# Patient Record
Sex: Female | Born: 1950
Health system: Southern US, Community
[De-identification: ages and names within clinical notes are randomized; demographics above are authoritative.]

## PROBLEM LIST (undated history)

## (undated) DIAGNOSIS — M25569 Pain in unspecified knee: Secondary | ICD-10-CM

## (undated) DIAGNOSIS — R112 Nausea with vomiting, unspecified: Secondary | ICD-10-CM

## (undated) DIAGNOSIS — I82890 Acute embolism and thrombosis of other specified veins: Secondary | ICD-10-CM

## (undated) DIAGNOSIS — T8859XA Other complications of anesthesia, initial encounter: Secondary | ICD-10-CM

## (undated) DIAGNOSIS — T4145XA Adverse effect of unspecified anesthetic, initial encounter: Secondary | ICD-10-CM

## (undated) DIAGNOSIS — G2581 Restless legs syndrome: Secondary | ICD-10-CM

## (undated) DIAGNOSIS — K219 Gastro-esophageal reflux disease without esophagitis: Secondary | ICD-10-CM

## (undated) DIAGNOSIS — Z923 Personal history of irradiation: Secondary | ICD-10-CM

## (undated) DIAGNOSIS — K5790 Diverticulosis of intestine, part unspecified, without perforation or abscess without bleeding: Secondary | ICD-10-CM

## (undated) DIAGNOSIS — F419 Anxiety disorder, unspecified: Secondary | ICD-10-CM

## (undated) DIAGNOSIS — Z87442 Personal history of urinary calculi: Secondary | ICD-10-CM

## (undated) DIAGNOSIS — Z9889 Other specified postprocedural states: Secondary | ICD-10-CM

## (undated) DIAGNOSIS — M719 Bursopathy, unspecified: Secondary | ICD-10-CM

## (undated) DIAGNOSIS — D649 Anemia, unspecified: Secondary | ICD-10-CM

## (undated) DIAGNOSIS — J189 Pneumonia, unspecified organism: Secondary | ICD-10-CM

## (undated) DIAGNOSIS — K591 Functional diarrhea: Secondary | ICD-10-CM

## (undated) DIAGNOSIS — I10 Essential (primary) hypertension: Secondary | ICD-10-CM

## (undated) DIAGNOSIS — M1990 Unspecified osteoarthritis, unspecified site: Secondary | ICD-10-CM

## (undated) DIAGNOSIS — E7800 Pure hypercholesterolemia, unspecified: Secondary | ICD-10-CM

## (undated) DIAGNOSIS — E079 Disorder of thyroid, unspecified: Secondary | ICD-10-CM

## (undated) DIAGNOSIS — C801 Malignant (primary) neoplasm, unspecified: Secondary | ICD-10-CM

## (undated) DIAGNOSIS — M722 Plantar fascial fibromatosis: Secondary | ICD-10-CM

## (undated) DIAGNOSIS — K2100 Gastro-esophageal reflux disease with esophagitis, without bleeding: Secondary | ICD-10-CM

## (undated) HISTORY — DX: Disorder of thyroid, unspecified: E07.9

## (undated) HISTORY — DX: Pure hypercholesterolemia, unspecified: E78.00

## (undated) HISTORY — DX: Restless legs syndrome: G25.81

## (undated) HISTORY — DX: Gastro-esophageal reflux disease with esophagitis: K21.0

## (undated) HISTORY — DX: Bursopathy, unspecified: M71.9

## (undated) HISTORY — DX: Personal history of urinary calculi: Z87.442

## (undated) HISTORY — PX: APPENDECTOMY: SHX54

## (undated) HISTORY — DX: Plantar fascial fibromatosis: M72.2

## (undated) HISTORY — DX: Unspecified osteoarthritis, unspecified site: M19.90

## (undated) HISTORY — PX: OVARIAN CYST REMOVAL: SHX89

## (undated) HISTORY — DX: Pain in unspecified knee: M25.569

## (undated) HISTORY — DX: Diverticulosis of intestine, part unspecified, without perforation or abscess without bleeding: K57.90

## (undated) HISTORY — PX: COLPORRHAPHY: SHX921

## (undated) HISTORY — DX: Gastro-esophageal reflux disease without esophagitis: K21.9

## (undated) HISTORY — PX: TONSILLECTOMY: SHX5217

## (undated) HISTORY — PX: TONSILLECTOMY: SUR1361

## (undated) HISTORY — PX: TUBAL LIGATION: SHX77

## (undated) HISTORY — DX: Essential (primary) hypertension: I10

## (undated) HISTORY — DX: Gastro-esophageal reflux disease with esophagitis, without bleeding: K21.00

## (undated) HISTORY — PX: VARICOSE VEIN SURGERY: SHX832

---
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
## 2000-11-26 ENCOUNTER — Other Ambulatory Visit: Admission: RE | Admit: 2000-11-26 | Discharge: 2000-11-26 | Payer: Self-pay | Admitting: Obstetrics and Gynecology

## 2002-03-06 ENCOUNTER — Other Ambulatory Visit: Admission: RE | Admit: 2002-03-06 | Discharge: 2002-03-06 | Payer: Self-pay | Admitting: Obstetrics and Gynecology

## 2003-03-05 ENCOUNTER — Encounter: Admission: RE | Admit: 2003-03-05 | Discharge: 2003-03-05 | Payer: Self-pay | Admitting: Gastroenterology

## 2003-03-09 ENCOUNTER — Encounter: Admission: RE | Admit: 2003-03-09 | Discharge: 2003-03-09 | Payer: Self-pay | Admitting: Family Medicine

## 2003-05-12 ENCOUNTER — Encounter (INDEPENDENT_AMBULATORY_CARE_PROVIDER_SITE_OTHER): Payer: Self-pay | Admitting: *Deleted

## 2003-05-12 ENCOUNTER — Ambulatory Visit (HOSPITAL_COMMUNITY): Admission: RE | Admit: 2003-05-12 | Discharge: 2003-05-12 | Payer: Self-pay | Admitting: Gastroenterology

## 2003-06-21 ENCOUNTER — Other Ambulatory Visit: Admission: RE | Admit: 2003-06-21 | Discharge: 2003-06-21 | Payer: Self-pay | Admitting: Obstetrics and Gynecology

## 2003-07-08 ENCOUNTER — Encounter: Admission: RE | Admit: 2003-07-08 | Discharge: 2003-07-08 | Payer: Self-pay | Admitting: General Surgery

## 2003-07-12 ENCOUNTER — Encounter (INDEPENDENT_AMBULATORY_CARE_PROVIDER_SITE_OTHER): Payer: Self-pay | Admitting: *Deleted

## 2003-07-12 ENCOUNTER — Ambulatory Visit (HOSPITAL_COMMUNITY): Admission: RE | Admit: 2003-07-12 | Discharge: 2003-07-12 | Payer: Self-pay | Admitting: General Surgery

## 2003-07-12 ENCOUNTER — Ambulatory Visit (HOSPITAL_BASED_OUTPATIENT_CLINIC_OR_DEPARTMENT_OTHER): Admission: RE | Admit: 2003-07-12 | Discharge: 2003-07-12 | Payer: Self-pay | Admitting: General Surgery

## 2003-07-23 ENCOUNTER — Encounter: Admission: RE | Admit: 2003-07-23 | Discharge: 2003-07-23 | Payer: Self-pay | Admitting: General Surgery

## 2004-02-18 ENCOUNTER — Ambulatory Visit (HOSPITAL_COMMUNITY): Admission: RE | Admit: 2004-02-18 | Discharge: 2004-02-18 | Payer: Self-pay | Admitting: Internal Medicine

## 2004-11-20 ENCOUNTER — Encounter: Admission: RE | Admit: 2004-11-20 | Discharge: 2004-11-20 | Payer: Self-pay | Admitting: Obstetrics and Gynecology

## 2005-04-26 ENCOUNTER — Encounter (INDEPENDENT_AMBULATORY_CARE_PROVIDER_SITE_OTHER): Payer: Self-pay | Admitting: Specialist

## 2005-04-26 ENCOUNTER — Ambulatory Visit (HOSPITAL_BASED_OUTPATIENT_CLINIC_OR_DEPARTMENT_OTHER): Admission: RE | Admit: 2005-04-26 | Discharge: 2005-04-26 | Payer: Self-pay | Admitting: Plastic Surgery

## 2005-05-02 ENCOUNTER — Inpatient Hospital Stay (HOSPITAL_COMMUNITY): Admission: AC | Admit: 2005-05-02 | Discharge: 2005-05-04 | Payer: Self-pay

## 2005-07-20 ENCOUNTER — Encounter: Admission: RE | Admit: 2005-07-20 | Discharge: 2005-07-20 | Payer: Self-pay | Admitting: Obstetrics and Gynecology

## 2005-07-24 ENCOUNTER — Other Ambulatory Visit: Admission: RE | Admit: 2005-07-24 | Discharge: 2005-07-24 | Payer: Self-pay | Admitting: Obstetrics and Gynecology

## 2005-10-17 ENCOUNTER — Observation Stay (HOSPITAL_COMMUNITY): Admission: AD | Admit: 2005-10-17 | Discharge: 2005-10-18 | Payer: Self-pay | Admitting: Family Medicine

## 2005-10-17 ENCOUNTER — Ambulatory Visit: Payer: Self-pay | Admitting: *Deleted

## 2005-11-02 ENCOUNTER — Encounter (HOSPITAL_COMMUNITY): Admission: RE | Admit: 2005-11-02 | Discharge: 2005-11-17 | Payer: Self-pay | Admitting: *Deleted

## 2005-12-05 ENCOUNTER — Encounter: Admission: RE | Admit: 2005-12-05 | Discharge: 2005-12-05 | Payer: Self-pay | Admitting: Obstetrics and Gynecology

## 2006-05-14 ENCOUNTER — Encounter: Admission: RE | Admit: 2006-05-14 | Discharge: 2006-05-14 | Payer: Self-pay | Admitting: Obstetrics and Gynecology

## 2006-11-06 ENCOUNTER — Ambulatory Visit: Payer: Self-pay | Admitting: Psychology

## 2006-12-06 ENCOUNTER — Ambulatory Visit: Payer: Self-pay | Admitting: Psychology

## 2006-12-09 ENCOUNTER — Encounter: Admission: RE | Admit: 2006-12-09 | Discharge: 2006-12-09 | Payer: Self-pay | Admitting: Obstetrics and Gynecology

## 2006-12-17 ENCOUNTER — Other Ambulatory Visit: Admission: RE | Admit: 2006-12-17 | Discharge: 2006-12-17 | Payer: Self-pay | Admitting: Obstetrics and Gynecology

## 2006-12-20 ENCOUNTER — Ambulatory Visit: Payer: Self-pay | Admitting: Psychology

## 2007-01-02 ENCOUNTER — Ambulatory Visit: Payer: Self-pay | Admitting: Psychology

## 2007-01-15 ENCOUNTER — Ambulatory Visit: Payer: Self-pay | Admitting: Psychology

## 2007-01-27 ENCOUNTER — Ambulatory Visit: Payer: Self-pay | Admitting: Psychology

## 2007-03-11 ENCOUNTER — Ambulatory Visit: Payer: Self-pay | Admitting: Psychology

## 2008-04-22 ENCOUNTER — Other Ambulatory Visit: Admission: RE | Admit: 2008-04-22 | Discharge: 2008-04-22 | Payer: Self-pay | Admitting: Obstetrics & Gynecology

## 2009-01-19 ENCOUNTER — Encounter: Admission: RE | Admit: 2009-01-19 | Discharge: 2009-01-19 | Payer: Self-pay | Admitting: Obstetrics & Gynecology

## 2009-01-27 ENCOUNTER — Encounter: Admission: RE | Admit: 2009-01-27 | Discharge: 2009-01-27 | Payer: Self-pay | Admitting: Obstetrics & Gynecology

## 2009-02-19 HISTORY — PX: CARPAL TUNNEL RELEASE: SHX101

## 2009-02-19 HISTORY — PX: RECTOCELE REPAIR: SHX761

## 2009-04-27 LAB — HM PAP SMEAR

## 2009-10-18 ENCOUNTER — Ambulatory Visit (HOSPITAL_COMMUNITY)
Admission: RE | Admit: 2009-10-18 | Discharge: 2009-10-19 | Payer: Self-pay | Source: Home / Self Care | Admitting: Urology

## 2009-10-18 ENCOUNTER — Encounter: Payer: Self-pay | Admitting: Obstetrics & Gynecology

## 2009-10-18 HISTORY — PX: TOTAL VAGINAL HYSTERECTOMY: SHX2548

## 2010-03-11 ENCOUNTER — Encounter: Payer: Self-pay | Admitting: Obstetrics and Gynecology

## 2010-03-12 ENCOUNTER — Encounter: Payer: Self-pay | Admitting: Obstetrics and Gynecology

## 2010-03-22 ENCOUNTER — Encounter: Payer: Self-pay | Admitting: Obstetrics & Gynecology

## 2010-05-02 ENCOUNTER — Other Ambulatory Visit: Payer: Self-pay | Admitting: Obstetrics & Gynecology

## 2010-05-02 DIAGNOSIS — Z1231 Encounter for screening mammogram for malignant neoplasm of breast: Secondary | ICD-10-CM

## 2010-05-05 ENCOUNTER — Ambulatory Visit: Payer: Self-pay

## 2010-05-05 LAB — COMPREHENSIVE METABOLIC PANEL
ALT: 24 U/L (ref 0–35)
AST: 26 U/L (ref 0–37)
Albumin: 4.3 g/dL (ref 3.5–5.2)
Alkaline Phosphatase: 69 U/L (ref 39–117)
BUN: 17 mg/dL (ref 6–23)
CO2: 28 mEq/L (ref 19–32)
Calcium: 9.4 mg/dL (ref 8.4–10.5)
Chloride: 105 mEq/L (ref 96–112)
Creatinine, Ser: 0.83 mg/dL (ref 0.4–1.2)
GFR calc Af Amer: >60 mL/min (ref >60)
GFR calc non Af Amer: >60 mL/min (ref >60)
Glucose, Bld: 100 mg/dL — ABNORMAL HIGH (ref 70–99)
Potassium: 4.4 mEq/L (ref 3.5–5.1)
Sodium: 138 mEq/L (ref 135–145)
Total Bilirubin: 0.8 mg/dL (ref 0.3–1.2)
Total Protein: 7.6 g/dL (ref 6.0–8.3)

## 2010-05-05 LAB — APTT: aPTT: 29 seconds (ref 24–37)

## 2010-05-05 LAB — CBC
HCT: 31 % — ABNORMAL LOW (ref 36.0–46.0)
HCT: 40.3 % (ref 36.0–46.0)
Hemoglobin: 11 g/dL — ABNORMAL LOW (ref 12.0–15.0)
Hemoglobin: 14.1 g/dL (ref 12.0–15.0)
MCH: 32.7 pg (ref 26.0–34.0)
MCH: 33.3 pg (ref 26.0–34.0)
MCHC: 35.1 g/dL (ref 30.0–36.0)
MCHC: 35.6 g/dL (ref 30.0–36.0)
MCV: 93.4 fL (ref 78.0–100.0)
MCV: 93.7 fL (ref 78.0–100.0)
Platelets: 238 10*3/uL (ref 150–400)
Platelets: 254 10*3/uL (ref 150–400)
RBC: 3.3 MIL/uL — ABNORMAL LOW (ref 3.87–5.11)
RBC: 4.32 MIL/uL (ref 3.87–5.11)
RDW: 11.9 % (ref 11.5–15.5)
RDW: 12.2 % (ref 11.5–15.5)
WBC: 5.9 10*3/uL (ref 4.0–10.5)
WBC: 8.6 10*3/uL (ref 4.0–10.5)

## 2010-05-05 LAB — TYPE AND SCREEN
ABO/RH(D): A POS
Antibody Screen: NEGATIVE

## 2010-05-05 LAB — PROTIME-INR
INR: 1 (ref 0.00–1.49)
Prothrombin Time: 13.4 seconds (ref 11.6–15.2)

## 2010-05-05 LAB — URINALYSIS, ROUTINE W REFLEX MICROSCOPIC
Bilirubin Urine: NEGATIVE
Glucose, UA: NEGATIVE mg/dL
Hgb urine dipstick: NEGATIVE
Ketones, ur: NEGATIVE mg/dL
Nitrite: NEGATIVE
Protein, ur: NEGATIVE mg/dL
Specific Gravity, Urine: 1.01 (ref 1.005–1.030)
Urobilinogen, UA: 0.2 mg/dL (ref 0.0–1.0)
pH: 7 (ref 5.0–8.0)

## 2010-05-05 LAB — BASIC METABOLIC PANEL
BUN: 6 mg/dL (ref 6–23)
CO2: 26 mEq/L (ref 19–32)
Calcium: 8.8 mg/dL (ref 8.4–10.5)
Chloride: 107 mEq/L (ref 96–112)
Creatinine, Ser: 0.79 mg/dL (ref 0.4–1.2)
GFR calc Af Amer: >60 mL/min (ref >60)
GFR calc non Af Amer: >60 mL/min (ref >60)
Glucose, Bld: 149 mg/dL — ABNORMAL HIGH (ref 70–99)
Potassium: 4.2 mEq/L (ref 3.5–5.1)
Sodium: 140 mEq/L (ref 135–145)

## 2010-05-05 LAB — HEMOGLOBIN AND HEMATOCRIT, BLOOD
HCT: 32.6 % — ABNORMAL LOW (ref 36.0–46.0)
HCT: 34.1 % — ABNORMAL LOW (ref 36.0–46.0)
Hemoglobin: 11.6 g/dL — ABNORMAL LOW (ref 12.0–15.0)
Hemoglobin: 12.1 g/dL (ref 12.0–15.0)

## 2010-05-05 LAB — SURGICAL PCR SCREEN
MRSA, PCR: NEGATIVE
Staphylococcus aureus: POSITIVE — AB

## 2010-05-05 LAB — ABO/RH: ABO/RH(D): A POS

## 2010-05-15 ENCOUNTER — Other Ambulatory Visit (HOSPITAL_COMMUNITY): Payer: Self-pay | Admitting: Family Medicine

## 2010-05-15 ENCOUNTER — Ambulatory Visit (HOSPITAL_COMMUNITY)
Admission: RE | Admit: 2010-05-15 | Discharge: 2010-05-15 | Disposition: A | Discharge status: Home / Self Care | Payer: BC Managed Care – PPO | Source: Ambulatory Visit | Attending: Family Medicine | Admitting: Family Medicine

## 2010-05-15 DIAGNOSIS — R0609 Other forms of dyspnea: Secondary | ICD-10-CM | POA: Insufficient documentation

## 2010-05-15 DIAGNOSIS — R05 Cough: Secondary | ICD-10-CM | POA: Insufficient documentation

## 2010-05-15 DIAGNOSIS — R0600 Dyspnea, unspecified: Secondary | ICD-10-CM

## 2010-05-15 DIAGNOSIS — R059 Cough, unspecified: Secondary | ICD-10-CM | POA: Insufficient documentation

## 2010-05-15 DIAGNOSIS — R0989 Other specified symptoms and signs involving the circulatory and respiratory systems: Secondary | ICD-10-CM | POA: Insufficient documentation

## 2010-05-15 LAB — BLOOD GAS, ARTERIAL
Acid-base deficit: 0.9 mmol/L (ref 0.0–2.0)
Bicarbonate: 23.1 mEq/L (ref 20.0–24.0)
Drawn by: 23534
FIO2: 0.21 %
O2 Content: 21 L/min
O2 Saturation: 97.5 %
Patient temperature: 37
TCO2: 20.3 mmol/L (ref 0–100)
pCO2 arterial: 37.7 mmHg (ref 35.0–45.0)
pH, Arterial: 7.405 — ABNORMAL HIGH (ref 7.350–7.400)
pO2, Arterial: 90.9 mmHg (ref 80.0–100.0)

## 2010-07-05 ENCOUNTER — Ambulatory Visit
Admission: RE | Admit: 2010-07-05 | Discharge: 2010-07-05 | Disposition: A | Discharge status: Home / Self Care | Payer: BC Managed Care – PPO | Source: Ambulatory Visit | Attending: Obstetrics & Gynecology | Admitting: Obstetrics & Gynecology

## 2010-07-05 DIAGNOSIS — Z1231 Encounter for screening mammogram for malignant neoplasm of breast: Secondary | ICD-10-CM

## 2010-07-07 NOTE — Op Note (Signed)
NAME:  Linda Woods, Linda Woods                           ACCOUNT NO.:  [id number]   MEDICAL RECORD NO.:  [id number]                   PATIENT TYPE:  AMB   LOCATION:  ENDO                                 FACILITY:  [facility]   PHYSICIAN:  Anselmo Rod, M.D.               DATE OF BIRTH:  07-20-1950   DATE OF PROCEDURE:  05/12/2003  DATE OF DISCHARGE:                                 OPERATIVE REPORT   PROCEDURE:  Esophagogastroduodenoscopy.   ENDOSCOPIST:  Anselmo Rod, M.D.   INSTRUMENT:  Olympus video panendoscope.   INDICATIONS FOR PROCEDURE:  A 60 year old white female with a history of  abdominal pain and rectal bleeding undergoing an EGD to rule out peptic  ulcer disease.  The patient has a history of reflux with severe heartburn  for several years.   PREPROCEDURE PREPARATION:  Informed consent was obtained from the patient.  The patient fasted for eight hours prior to the procedure.   PREPROCEDURE PHYSICAL:  The patient had stable vital signs.  Neck supple.  Chest clear to auscultation.  S1, S2 regular.  Abdomen soft with normal  bowel sounds.   DESCRIPTION OF PROCEDURE:  The patient was placed in the left lateral  decubitus position and sedated with 6 mg of Demerol and 6 mg of Versed  intravenously.  Once the patient was adequately sedated and maintained on  low-flow oxygen and continuous cardiac monitoring, the Olympus video  panendoscope was advanced through the mouthpiece over the tongue into the  esophagus under direct vision.  The entire esophagus appeared normal with no  evidence of ring, strictures, masses, esophagitis, or Barrett's mucosa.  The  scope was then advanced into the stomach.  Diffuse gastritis was noted  throughout the gastric mucosa with small hiatal hernia seen on high  retroflexion.  The proximal small bowel appeared normal.  No ulcers,  erosions, masses, or polyps were seen.   IMPRESSION:  1. Diffuse gastritis with small hiatal hernia.  No ulcers or  masses seen.  2. Normal appearing esophagus and proximal small bowel.   RECOMMENDATIONS:  1. Continue PPIs.  2. Proceed with a colonoscopy at this time.  Further recommendations will be     made thereafter.                                               Anselmo Rod, M.D.    JNM/MEDQ  D:  05/12/2003  T:  05/13/2003  Job:  [id number]   cc:   Aram Beecham P. Romine, M.D.  [address]  [place]  [place] [address]  Fax: [phone]   Mila Homer. Sudie Bailey, M.D.  [address] [place], [place] [address]  Fax: [phone]

## 2010-07-07 NOTE — H&P (Signed)
NAME:  Linda Woods, Linda Woods                 ACCOUNT NO.:  [id number]   MEDICAL RECORD NO.:  [id number]          PATIENT TYPE:  INP   LOCATION:  A216                          FACILITY:  [facility]   PHYSICIAN:  Mila Homer. Sudie Bailey, M.D.DATE OF BIRTH:  [birth year]   DATE OF ADMISSION:  10/17/2005  DATE OF DISCHARGE:  LH                                HISTORY & PHYSICAL   This 60 year old woman developed left chest discomfort radiating to her left  shoulder and down her left elbow shortly after lunch today.  She never had  symptoms like this before.  The pain was rated an 8 out of 10 scale.  After  she took calcium carbonate with Simethicone, the pain gradually seemed to  get better but then waxed and waned.  She did not break out in a sweat.  She  had no severe weakness.  She had no nausea and vomiting.  She had no dyspnea  with this.   CURRENT MEDICATIONS:  Synthroid 100 mcg daily and she had an Allegra D 24  hours this morning for allergies.  She has a history of hypertriglyceridemia  but could not tolerate Tricor which caused muscle pains.  She was on no  other regular meds.   FAMILY HISTORY:  Father died in his 82s of CHF and COPD and probably had  CAD, he was a heavy smoker.  Mother had uterine cancer and radiation to the  chest secondary to this and ended up with coronary artery disease that was  thought to be secondary to radiation but died in her 46s, as well.   Other than hypertriglyceridemia, the patient has had no coronary risks.  She  has never been smoker.  She has had no problem with hypertension or  diabetes.   PHYSICAL EXAMINATION:  GENERAL:  Today, she ambulated in the office.  She is well developed, well  nourished, and distressed due to left upper chest pain and pressure.  VITAL SIGNS:  Blood pressure 110/80 and pulse 120.  HEART:  Regular rhythm at a rate of 120, no murmurs.  LUNGS:  Clear throughout, she was moving air well.  ABDOMEN:  Soft without hepatosplenomegaly or  mass.  She had no tenderness to palpation of the left clavicle, AC joint, or  shoulder.  In fact, she moves her shoulder well.  Full extension of the  shoulder without pain.  EXTREMITIES:  Grossly normal.   A 12 lead EKG was run in the office and other than sinus tachycardia at a  rate of 120, the EKG was normal.   ADMISSION ASSESSMENT:  1. Chest pain.  2. Hypothyroidism on replacement thyroid hormone.  3. Allergic rhinitis on antihistamine and decongestants.   I have admitted her to the hospital on a monitor.  Cardiac enzymes are  pending as is T4, TSH, CBC, lipids.  Chest x-ray is, likewise, pending and a  repeat 12 lead EKG.   Further, I have discussed with Dr. Dionicio Stall, cardiologist.  He will see  her this afternoon.  He is also adding a D-dimer to her list of labs.  She  may require a stress test.      Mila Homer. Sudie Bailey, M.D.  Electronically Signed     SDK/MEDQ  D:  10/17/2005  T:  10/17/2005  Job:  [id number]

## 2010-07-07 NOTE — Discharge Summary (Signed)
NAME:  Linda Woods, Linda Woods                 ACCOUNT NO.:  [id number]   MEDICAL RECORD NO.:  [id number]          PATIENT TYPE:  INP   LOCATION:  A216                          FACILITY:  [facility]   PHYSICIAN:  Mila Homer. Sudie Bailey, M.D.DATE OF BIRTH:  Jul 18, 1950   DATE OF ADMISSION:  10/17/2005  DATE OF DISCHARGE:  08/30/2007LH                                 DISCHARGE SUMMARY   SUMMARY:  This 60 year old woman was admitted yesterday after developing  severe left anterior  chest discomfort with a pain level of eight on a scale  of 10.  This also was felt in the left shoulder and down the left elbow.  She had been on a two-day hospitalization.  Vital signs remained stable.  Cardiac enzymes times three were normal.  Her CBC and CMET were normal.  She  had a T4 of 5.3, TSH 1.06, D-dimer 0.29, total cholesterol of 275,  triglycerides 372, HDL 53, and LDL 147.  However, her EKG was essentially  normal.  The second day she felt much better.  She had a tiny bit of  discomfort in the left anterior  chest the night before, but otherwise felt  absolutely fine.  She had been seen by Dr. Dionicio Stall, cardiologist.  She  was to be set up for a stress test, outpatient.   DISCHARGE DIAGNOSES:  1. Chest pain.  2. Reflux esophagitis.  3. Hypercholesterolemia.  4. Hypothyroidism.  5. Side effects from the Statin drug atorvastatin.   DISCHARGE MEDICATIONS:  The patient is discharged home on:  1. Lovastatin 10 mg daily (30 with 11 refills).  2. The patient is to continue her Synthroid 100 mcg daily.  3. The patient is to take an aspirin 81 mg daily.   The patient had a problem with achiness on Lipitor in the past.  If she has  any problems with achiness on lovastatin we will put her on Tricor 145 mg  daily.   DISCHARGE INSTRUCTIONS:  In the meantime we will continue her no her  exercise and diet program, which she has already been in an attempt to lower  her weight further.      Mila Homer. Sudie Bailey, M.D.  Electronically Signed     SDK/MEDQ  D:  10/18/2005  T:  10/18/2005  Job:  [id number]

## 2010-07-07 NOTE — Op Note (Signed)
NAME:  Linda Woods, Linda Woods                           ACCOUNT NO.:  [id number]   MEDICAL RECORD NO.:  [id number]                   PATIENT TYPE:  AMB   LOCATION:  ENDO                                 FACILITY:  [facility]   PHYSICIAN:  Anselmo Rod, M.D.               DATE OF BIRTH:  1950/06/04   DATE OF PROCEDURE:  05/12/2003  DATE OF DISCHARGE:                                 OPERATIVE REPORT   PROCEDURE PERFORMED:  Colonoscopy with biopsies.   ENDOSCOPIST:  Anselmo Rod, M.D.   INSTRUMENT USED:  Olympus video colonoscope.   INDICATION FOR PROCEDURE:  A 60 year old white female with a history of  rectal bleeding and abdominal pain with mucoid stools.  Rule out colonic  polyps, masses, IBD, etc.   PREPROCEDURE PREPARATION:  Informed consent was obtained.  The patient was  fasted for eight hours prior to the procedure and prepped with a bottle of  magnesium citrate and a gallon of GoLYTELY the night prior to the procedure.   PREPROCEDURE PHYSICAL:  VITAL SIGNS:  The patient had stable vital signs.  NECK:  Supple.  CHEST:  Clear to auscultation.  S1, S2 regular.  ABDOMEN:  Soft with normal bowel sounds.   DESCRIPTION OF PROCEDURE:  The patient was placed in the left lateral  decubitus position and sedated with Demerol and Versed for the EGD.  Once  the patient was adequately sedate and maintained on low-flow oxygen and  continuous cardiac monitoring, the Olympus video colonoscope was advanced  from the rectum to the cecum and terminal ileum.  The entire exam revealed  no abnormalities except for a nodular lesion in the rectum that was biopsied  for pathology.  Small internal hemorrhoids were seen on retroflexion as  well.  The rectal lesion was biopsied for pathology.  There was no evidence  of diverticulosis.  The patient tolerated the procedure well without  complication.   IMPRESSION:  1. Small nodular lesion of the anal verge, biopsied for pathology.  2. Small internal  hemorrhoids.  3. No evidence of diverticulosis.  4. Normal-appearing sigmoid colon, transverse colon, right colon, cecum, and     terminal ileum.   RECOMMENDATIONS:  1. Await pathology results.  2. Avoid all nonsteroidals for now.  3. Outpatient follow-up in the next two weeks for further recommendations.     The patient will require surgical evaluation for removal of the lesion at     the anal verge.  This has been discussed with her in great detail.  The     patient seems to understand.  Her husband is aware of this as well.                                               Jyothi Nat  Loreta Ave, M.D.    JNM/MEDQ  D:  05/12/2003  T:  05/13/2003  Job:  [id number]   cc:   Aram Beecham P. Romine, M.D.  [address]  [place]  [place] [address]  Fax: [phone]   Mila Homer. Sudie Bailey, M.D.  [address] [place], [place] [address]  Fax: [phone]

## 2010-07-07 NOTE — Consult Note (Signed)
NAME:  Linda Woods, Linda Woods                 ACCOUNT NO.:  [id number]   MEDICAL RECORD NO.:  [id number]          PATIENT TYPE:  INP   LOCATION:  A216                          FACILITY:  [facility]   PHYSICIAN:  Farris Has. Dorethea Clan, MD  DATE OF BIRTH:  06/27/1950   DATE OF CONSULTATION:  10/17/2005  DATE OF DISCHARGE:                                   CONSULTATION   PRIMARY CARE PHYSICIAN:  Mila Homer. Sudie Bailey, M.D.   HISTORY OF PRESENT ILLNESS:  Linda Woods is a 60 year old woman who is in  otherwise excellent health until this afternoon when she had an episode of  discomfort in her chest while eating in a restaurant.  It lasted about an  hour and was in the left anterior chest, radiated down the left arm,  associated with diaphoresis, no shortness of breath, no palpitations.  She  did say that she thought her heart was beating a little bit harder than it  would normally.  Denies any exertional component to the discomfort, no  relieving or exacerbating symptoms.  It waxed and waned over the course of  about an hour and then has pretty much resolved spontaneously.  She was seen  in Dr. Michelle Nasuti office and admitted directly to [facility].  She  is currently without pain.   PAST MEDICAL HISTORY:  Significant for hypothyroidism.  She is on  replacement for that.  She also has a history of hyperlipidemia.  She was  previously on a statin but stopped it due to myalgias.  She has had three  spontaneous vaginal deliveries.  Her youngest child is 51 years old.  She  has had several surgeries under general endotracheal anesthetic without  complications.  She had no complications with her pregnancy.   REVIEW OF SYSTEMS:  Negative.   ALLERGIES:  NO KNOWN DRUG ALLERGIES.   SOCIAL HISTORY:  She lives in [place].  She does not smoke, drink or use  illicit drugs.   FAMILY HISTORY:  Both parents have had cardiac disease but late in life.  She has no siblings with cardiac disease.   PHYSICAL  EXAMINATION:  GENERAL:  She is a well-developed, well-nourished  white female no apparent distress.  She is alert and oriented x4.  Very  pleasant, excellent historian.   CURRENT MEDICATIONS:  Include Synthroid 100 mcg daily, Allegra D one pill  daily, Nexium 1 pill daily I think it is 40 mg.   VITAL SIGNS:  Heart rate 89, respiratory rate 20, 128/92 blood pressure.  She is afebrile.  HEENT:  Unremarkable.  NECK:  Supple.  No jugular venous distention, carotid bruits.  CHEST:  Clear to auscultation.  CARDIOVASCULAR:  Regular without murmur.  First and second heart sounds are  normal.  ABDOMEN:  Soft and nontender with normal active bowel sounds, no  hepatosplenomegaly.  EXTREMITIES:  Lower extremities are without cyanosis, clubbing or edema.  Pulses are 1+.  She has no bruits noted.  NEUROLOGIC/MUSCULOSKELETAL:  Grossly nonfocal.   Electrocardiogram shows sinus rhythm at a rate of 89, normal intervals,  normal axes.  She has  nonspecific ST-T wave changes in the anterior and  inferior leads.  No old EKGs for comparison.  Her chest x-ray and  laboratories are all pending.  So this is a lady with a single episode of  chest pain, sounds pretty atypical, it has resolved.  We don't have an  entirely intact data set but she is pain free now.  Her EKG is mildly  abnormal but we have no EKGs for comparison.  She does have cardiac risk  factor of hyperlipidemia but no other significant cardiac risk factors.   1. Hyperlipidemia.  She has failed statins in the past.  She may benefit      from nonstatin therapy for her hyperlipidemia if indeed it continues to      be elevated.  2. Hypothyroidism.  She is on replacement.  TSH is pending.   RECOMMENDATIONS:  1. Check a D-dimer.  If this is normal, I would not pursue the diagnosis      of pulmonary embolus any further.  2. Check chest x-ray.  3. Check cycle the enzymes.  4. I am going to add aspirin 81 mg once a day.  5. If her cardiac  enzymes are negative, she has no recurrent chest      discomfort, her chest x-ray and her D-dimer are normal, I think that a      perfusion study would be reasonable.  That can be done either as an      inpatient or an outpatient depending on her preference as she would be      deemed relatively low risk.      Farris Has. Dorethea Clan, MD  Electronically Signed     JMH/MEDQ  D:  10/17/2005  T:  10/18/2005  Job:  [id number]

## 2010-07-07 NOTE — Op Note (Signed)
NAME:  Linda, Woods                 ACCOUNT NO.:  [id number]   MEDICAL RECORD NO.:  [id number]          PATIENT TYPE:  AMB   LOCATION:  DSC                          FACILITY:  [facility]   PHYSICIAN:  Alfredia Ferguson, M.D.  DATE OF BIRTH:  03/19/1950   DATE OF PROCEDURE:  04/26/2005  DATE OF DISCHARGE:                                 OPERATIVE REPORT   PREOPERATIVE DIAGNOSIS:  A 3-4 cm left upper back subcutaneous mass,  probable lipoma.   POSTOPERATIVE DIAGNOSIS:  A 3-4 cm left upper back subcutaneous mass,  probable lipoma.   OPERATION PERFORMED:  Excision of 4.5 cm lipoma, left upper back paraspinal  area.   SURGEON:  Dr. Benna Dunks.   ANESTHESIA:  MAC supplemented with 1% Xylocaine 1:100,000 epinephrine.   INDICATIONS FOR SURGERY:  This is a 60 year old woman with a slowly  enlarging subcutaneous mass in the left upper back just to the left to the  paraspinous area. The patient wishes to have this area removed. She  understands the risk of scarring, bleeding, infection and recurrence of the  lesion. In spite of that the patient wishes to proceed.   DESCRIPTION OF SURGERY:  Skin marks were placed with a transversely oriented  mark and the patient was then taken to the OR. She was given intravenous  analgesia and the patient was then placed in prone position. 1% Xylocaine  1:100,000 epinephrine was infiltrated in the area of the incision. The area  was prepped with Betadine and draped with sterile drapes. Approximately 3.5  cm incision was made and deepened until reaching the mass. The mass was  encased in a fibrous material. This fibrous material was dissected away from  the surrounding normal tissue. Dissection was carried out using a  combination of sharp and electrocautery dissection. The depth of the lesion  went down to the paraspinous muscles. The hemostasis was maintained  throughout the dissection using electrocautery. There was one large vessel  going into this mass which  was clamped and tied with a 3-0 Vicryl. The  lesion was completely removed. The specimen submitted for pathology. The  wound was irrigated with saline irrigation. 10 mL of  0.5% Marcaine was also  infiltrated at the end of the surgery for postop pain relief. After  achieving meticulous hemostasis the wound was closed by approximating the  dermis with interrupted 3-0 Monocryl suture. Several interrupted 4-0 nylons  were also placed in the skin edges. Steri-Strips followed by a light  dressing was applied. The patient was transported to recovery room in  satisfactory condition.      Alfredia Ferguson, M.D.  Electronically Signed     WBB/MEDQ  D:  04/26/2005  T:  04/26/2005  Job:  [id number]

## 2010-07-07 NOTE — Op Note (Signed)
NAME:  Linda Woods, Linda Woods                           ACCOUNT NO.:  [id number]   MEDICAL RECORD NO.:  [id number]                   PATIENT TYPE:  AMB   LOCATION:  DSC                                  FACILITY:  [facility]   PHYSICIAN:  Adolph Pollack, M.D.            DATE OF BIRTH:  December 21, 1950   DATE OF PROCEDURE:  07/12/2003  DATE OF DISCHARGE:                                 OPERATIVE REPORT   PREOPERATIVE DIAGNOSIS:  Rectal mass.   POSTOPERATIVE DIAGNOSIS:  Rectal mass.   PROCEDURE:  Transanal excision of rectal mass.   SURGEON:  Adolph Pollack, M.D.   ANESTHESIA:  General.   INDICATIONS:  This is a 60 year old female who had rectal bleeding,  underwent a colonoscopy.  She had a known rectal mass present anteriorly and  this was biopsied and was benign, but the mass was not able to be completely  removed.  I discussed with her transanal excision and she agreed, and now  she presents for that.   TECHNIQUE:  She is seen in the holding area, then brought to the operating  room and placed supine on the operating table, and a general anesthetic was  administered.  She was then placed in the lithotomy position.  The perianal  area was sterilely prepped and draped.  Digital rectal exam was performed  and the mass was identified anteriorly.  Anoscopy was performed and the mass  was visualized.  I grasped the mass with Allis forceps and anesthetized the  subcutaneous tissue with Marcaine.  Using electrocautery I excised the mass  and sent it to pathology.  Bleeding was controlled with some cautery.  I  then reapproximated the anal mucosa with interrupted chromic sutures.  Hemostasis was adequate at this time.  A piece of Gelfoam was placed also on  the wound.  A bulky dressing was applied.  She was then taken out of the  lithotomy position and placed supine.   She tolerated the procedure without any apparent complications, was taken to  the recovery room in satisfactory condition.   Postop instructions were given  to her as well as Vicodin for pain, and the plan is for follow-up in the  office in approximately one to two weeks.                                               Adolph Pollack, M.D.    Kari Baars  D:  07/12/2003  T:  07/13/2003  Job:  [id number]   cc:   Anselmo Rod, M.D.  [address]  [place]  [place] [address]  Fax: [phone]   Mila Homer. Sudie Bailey, M.D.  [address] [place], [place] [address]  Fax: [phone]

## 2011-08-13 ENCOUNTER — Other Ambulatory Visit: Payer: Self-pay | Admitting: Obstetrics & Gynecology

## 2011-08-13 DIAGNOSIS — Z1231 Encounter for screening mammogram for malignant neoplasm of breast: Secondary | ICD-10-CM

## 2011-10-01 ENCOUNTER — Ambulatory Visit: Payer: BC Managed Care – PPO

## 2012-01-14 ENCOUNTER — Ambulatory Visit
Admission: RE | Admit: 2012-01-14 | Discharge: 2012-01-14 | Disposition: A | Discharge status: Home / Self Care | Payer: BC Managed Care – PPO | Source: Ambulatory Visit | Attending: Obstetrics & Gynecology | Admitting: Obstetrics & Gynecology

## 2012-01-14 DIAGNOSIS — Z1231 Encounter for screening mammogram for malignant neoplasm of breast: Secondary | ICD-10-CM

## 2013-03-23 ENCOUNTER — Other Ambulatory Visit: Payer: Self-pay

## 2013-03-23 DIAGNOSIS — Z1231 Encounter for screening mammogram for malignant neoplasm of breast: Secondary | ICD-10-CM

## 2013-03-31 ENCOUNTER — Ambulatory Visit: Payer: Self-pay | Admitting: Nurse Practitioner

## 2013-04-08 ENCOUNTER — Ambulatory Visit: Payer: BC Managed Care – PPO

## 2013-04-23 ENCOUNTER — Ambulatory Visit
Admission: RE | Admit: 2013-04-23 | Discharge: 2013-04-23 | Disposition: A | Discharge status: Home / Self Care | Payer: BC Managed Care – PPO | Source: Ambulatory Visit

## 2013-04-23 DIAGNOSIS — Z1231 Encounter for screening mammogram for malignant neoplasm of breast: Secondary | ICD-10-CM

## 2013-05-20 ENCOUNTER — Encounter: Payer: Self-pay | Admitting: Nurse Practitioner

## 2013-05-21 ENCOUNTER — Ambulatory Visit (INDEPENDENT_AMBULATORY_CARE_PROVIDER_SITE_OTHER): Payer: BC Managed Care – PPO | Admitting: Nurse Practitioner

## 2013-05-21 ENCOUNTER — Encounter: Payer: Self-pay | Admitting: Nurse Practitioner

## 2013-05-21 VITALS — BP 130/86 | HR 84 | Ht 65.5 in | Wt 169.0 lb

## 2013-05-21 DIAGNOSIS — E559 Vitamin D deficiency, unspecified: Secondary | ICD-10-CM | POA: Insufficient documentation

## 2013-05-21 DIAGNOSIS — Z0000 Encounter for general adult medical examination without abnormal findings: Secondary | ICD-10-CM

## 2013-05-21 DIAGNOSIS — Z01419 Encounter for gynecological examination (general) (routine) without abnormal findings: Secondary | ICD-10-CM

## 2013-05-21 DIAGNOSIS — I10 Essential (primary) hypertension: Secondary | ICD-10-CM | POA: Insufficient documentation

## 2013-05-21 DIAGNOSIS — E039 Hypothyroidism, unspecified: Secondary | ICD-10-CM | POA: Insufficient documentation

## 2013-05-21 LAB — POCT URINALYSIS DIPSTICK
Bilirubin, UA: NEGATIVE
Blood, UA: NEGATIVE
Glucose, UA: NEGATIVE
Ketones, UA: NEGATIVE
Leukocytes, UA: NEGATIVE
Nitrite, UA: NEGATIVE
Protein, UA: NEGATIVE
Urobilinogen, UA: NEGATIVE
pH, UA: 5

## 2013-05-21 LAB — HEMOGLOBIN, FINGERSTICK: Hemoglobin, fingerstick: 13.5 g/dL (ref 12.0–16.0)

## 2013-05-21 NOTE — Patient Instructions (Signed)

## 2013-05-21 NOTE — Progress Notes (Signed)
Patient ID: Linda Woods, female   DOB: [birth year], 63 y.o.   MRN: [id number] 63 y.o. [id number] Married Caucasian Fe here for annual exam.  Doing well has been with grandchildren this week.  PCP is now watching blood sugar and thyroid.  She has appointment there next week.  Patient's last menstrual period was 08/21/2002.          Sexually active: no  The current method of family planning is tubal ligation and post menopausal status.    Exercising: no  The patient does not participate in regular exercise at present.  Exercises off and on. Smoker:  no  Health Maintenance: Pap:  04/27/09, WNL MMG:  04/23/13, Bi-Rads 1: negative Colonoscopy:  03/2007, diverticula, repeat in 5 years BMD:  01/19/09, -0.2/-0.1 TDaP:  04/27/09 Labs:  HB:  13.5 Urine:  Negative   reports that she has never smoked. She has never used smokeless tobacco. She reports that she does not drink alcohol or use illicit drugs.  Past Medical History  Diagnosis Date  . Thyroid disease     hypothyroid  . Chronic reflux esophagitis   . Pure hypercholesterolemia   . History of kidney stones   . Plantar fasciitis   . Hypertension     Past Surgical History  Procedure Laterality Date  . Appendectomy    . Colporrhaphy      posterior  . Tonsillectomy    . Tubal ligation Bilateral   . Ovarian cyst removal    . Varicose vein surgery    . Total vaginal hysterectomy  2011    rectocele repair, sling  . Rectocele repair  2011    w/TVH and sling    Current Outpatient Prescriptions  Medication Sig Dispense Refill  . levothyroxine (SYNTHROID, LEVOTHROID) 100 MCG tablet Take 100 mcg by mouth daily before breakfast.      . losartan (COZAAR) 25 MG tablet Take 25 mg by mouth daily.      . Multiple Vitamin (MULTIVITAMIN) tablet Take 1 tablet by mouth daily.      . RABEprazole (ACIPHEX) 20 MG tablet Take 20 mg by mouth daily.       No current facility-administered medications for this visit.    Family History  Problem Relation Age  of Onset  . Cervical cancer Mother   . Heart failure Mother   . Heart failure Father   . Diabetes Father   . Breast cancer Maternal Aunt   . Breast cancer Paternal Aunt     ROS:  Pertinent items are noted in HPI.  Otherwise, a comprehensive ROS was negative.  Exam:   BP 130/86  Pulse 84  Ht 5' 5.5" (1.664 m)  Wt 169 lb (76.658 kg)  BMI 27.69 kg/m2  LMP 08/21/2002 Height: 5' 5.5" (166.4 cm)  Ht Readings from Last 3 Encounters:  05/21/13 5' 5.5" (1.664 m)    General appearance: alert, cooperative and appears stated age Head: Normocephalic, without obvious abnormality, atraumatic Neck: no adenopathy, supple, symmetrical, trachea midline and thyroid normal to inspection and palpation Lungs: clear to auscultation bilaterally Breasts: normal appearance, no masses or tenderness Heart: regular rate and rhythm Abdomen: soft, non-tender; no masses,  no organomegaly Extremities: extremities normal, atraumatic, no cyanosis or edema Skin: Skin color, texture, turgor normal. No rashes or lesions Lymph nodes: Cervical, supraclavicular, and axillary nodes normal. No abnormal inguinal nodes palpated Neurologic: Grossly normal   Pelvic: External genitalia:  no lesions  Urethra:  normal appearing urethra with no masses, tenderness or lesions              Bartholin's and Skene's: normal                 Vagina: normal appearing vagina with normal color and discharge, no lesions              Cervix: absent              Pap taken: no Bimanual Exam:  Uterus:  uterus absent              Adnexa: no mass, fullness, tenderness               Rectovaginal: Confirms               Anus:  normal sphincter tone, no lesions  A:  Well Woman with normal exam  S/P TVH and sling for prolapse and rectocele repair 09/2009  Hypothyroid, HTN  Vit D deficient  Borderline DM  Elevated BP today  P:   Pap smear as per guidelines   Mammogram due 3/16  Will follow with PCP next week  Will schedule  colonoscopy with PCP  Counseled on breast self exam, mammography screening, adequate intake of calcium and vitamin D, diet and exercise, Kegel's exercises return annually or prn  An After Visit Summary was printed and given to the patient.

## 2013-05-22 LAB — VITAMIN D 25 HYDROXY (VIT D DEFICIENCY, FRACTURES): Vit D, 25-Hydroxy: 31 ng/mL (ref 30–89)

## 2013-05-26 ENCOUNTER — Telehealth: Payer: Self-pay | Admitting: *Deleted

## 2013-05-26 NOTE — Telephone Encounter (Signed)
Patient returning Linda Woods's call. The patient states it is fine to leave a detailed message on her phone number designated above.

## 2013-05-26 NOTE — Telephone Encounter (Signed)
I have attempted to contact this patient by phone with the following results: left message to return my call on answering machine (home).  

## 2013-05-26 NOTE — Telephone Encounter (Signed)
I have attempted to contact this patient by phone with the following results: left message to return my call with husband at home number.

## 2013-05-26 NOTE — Telephone Encounter (Signed)
Returning a call to [place].

## 2013-05-26 NOTE — Telephone Encounter (Signed)
Message copied by Graylon Good on Tue May 26, 2013  9:16 AM ------      Message from: Kem Boroughs R      Created: Sun May 24, 2013  5:24 PM       Vit D is a little low, follow protocol ------

## 2013-05-26 NOTE — Progress Notes (Signed)
Encounter reviewed by Dr. Dandre Sisler Silva.  

## 2013-05-27 NOTE — Telephone Encounter (Signed)
Patient is returning a call to [place]. She states it is ok to leave a detailed message on her cell phone [phone]

## 2013-05-27 NOTE — Telephone Encounter (Signed)
I have attempted to contact this patient by phone with the following results: left message to return my call on answering machine (mobile per DPR).  Detailed message left on cell voicemail per previous note.  Pt will need to return call to discuss Vit D supplement.  Advised pt to ask for Olivia Mackie or Verline Lema if I am unavailable.

## 2013-05-27 NOTE — Telephone Encounter (Signed)
Returning a call to [place]. Patient says to call home number first.

## 2013-05-28 NOTE — Telephone Encounter (Signed)
Pt notified in result note.

## 2013-12-21 ENCOUNTER — Encounter: Payer: Self-pay | Admitting: Nurse Practitioner

## 2014-02-19 HISTORY — PX: HAND SURGERY: SHX662

## 2014-02-24 ENCOUNTER — Telehealth: Payer: Self-pay | Admitting: Obstetrics & Gynecology

## 2014-02-24 DIAGNOSIS — Z1211 Encounter for screening for malignant neoplasm of colon: Secondary | ICD-10-CM

## 2014-02-24 NOTE — Telephone Encounter (Signed)
Patient is asking for referral for colonoscopy with Dr.Medoff. Patient is asking to see an endocrinologist for a thyroid lump.

## 2014-02-25 NOTE — Telephone Encounter (Signed)
Called patient, phone at home rang and rang with no answer or voicemail.  Referral entered for Dr. Earlean Shawl for GI for screening colonoscopy.   Will speak with patient regarding thyroid concern.

## 2014-02-25 NOTE — Telephone Encounter (Signed)
Spoke with patient. Advised referral sent to Dr. Allyn Kenner and they will contact her to schedule. Prior patient of Dr. Collene Woods, patient states she will request her records.   Patient states that her PCP follows her thyroid. Patient feels she needs specialist. Patient also states she thinks she can feel something on her thyroid. Has not had imaging ordered by pcp.  Last visit with Linda Woods, [place] 05/2013, nothing on palpation of thyroid.  Advised patient referral should come from PCP, who is managing her thyroid. Patient disagrees. She does not want to obtain referral from pcp due to long wait in office and feels that it has been mismanaged. Advised patient that our office does not have records to send with referral, patient states she will obtain.   Advised would send request to Linda Cage, FNP as patient does not agree with my response.

## 2014-02-26 ENCOUNTER — Ambulatory Visit (HOSPITAL_COMMUNITY)
Admission: RE | Admit: 2014-02-26 | Discharge: 2014-02-26 | Disposition: A | Discharge status: Home / Self Care | Payer: BLUE CROSS/BLUE SHIELD | Source: Ambulatory Visit | Attending: Family Medicine | Admitting: Family Medicine

## 2014-02-26 ENCOUNTER — Other Ambulatory Visit (HOSPITAL_COMMUNITY): Payer: Self-pay | Admitting: Family Medicine

## 2014-02-26 ENCOUNTER — Telehealth: Payer: Self-pay | Admitting: Nurse Practitioner

## 2014-02-26 DIAGNOSIS — R221 Localized swelling, mass and lump, neck: Secondary | ICD-10-CM | POA: Diagnosis not present

## 2014-02-26 DIAGNOSIS — E0590 Thyrotoxicosis, unspecified without thyrotoxic crisis or storm: Secondary | ICD-10-CM | POA: Diagnosis present

## 2014-02-26 DIAGNOSIS — I10 Essential (primary) hypertension: Secondary | ICD-10-CM | POA: Insufficient documentation

## 2014-02-26 NOTE — Telephone Encounter (Signed)
Pt says she is returning Patty's call. Wanted to let her know she saw her pcp doctor this morning and was sent for a ultrasound.

## 2014-02-26 NOTE — Telephone Encounter (Signed)
Routing to [employer], FNP and Dr.Miller as Juluis Rainier. Please see previous telephone encounter from 02/24/14.  Routing to provider for final review. Patient agreeable to disposition. Will close encounter

## 2014-02-26 NOTE — Telephone Encounter (Signed)
Discussed with Dr. Sabra Heck.  We can see her today at 11:00 and proceed from there to evaluate her thyroid.  I called the home number - adult son states she is at the MD office now.  Looking at Epic schedule she is getting an [name] of neck/thyroid this am at 9:30 am.  So maybe she has been able to discuss with PCP and get follow up.  Routing back to Dr. Lestine Box.  I did leave MSG on her cell number to call us with an update.

## 2014-05-04 ENCOUNTER — Other Ambulatory Visit: Payer: Self-pay | Admitting: Gastroenterology

## 2014-05-04 DIAGNOSIS — K862 Cyst of pancreas: Secondary | ICD-10-CM

## 2014-05-13 ENCOUNTER — Ambulatory Visit
Admission: RE | Admit: 2014-05-13 | Discharge: 2014-05-13 | Disposition: A | Discharge status: Home / Self Care | Payer: BLUE CROSS/BLUE SHIELD | Source: Ambulatory Visit | Attending: Gastroenterology | Admitting: Gastroenterology

## 2014-05-13 DIAGNOSIS — K862 Cyst of pancreas: Secondary | ICD-10-CM

## 2014-05-28 ENCOUNTER — Ambulatory Visit: Payer: Self-pay | Admitting: Obstetrics & Gynecology

## 2014-10-11 ENCOUNTER — Ambulatory Visit: Payer: Self-pay | Admitting: Obstetrics & Gynecology

## 2014-10-12 ENCOUNTER — Encounter: Payer: Self-pay | Admitting: Obstetrics & Gynecology

## 2014-10-12 ENCOUNTER — Ambulatory Visit (INDEPENDENT_AMBULATORY_CARE_PROVIDER_SITE_OTHER): Payer: BLUE CROSS/BLUE SHIELD | Admitting: Obstetrics & Gynecology

## 2014-10-12 VITALS — BP 128/82 | HR 60 | Resp 16 | Ht 65.5 in | Wt 175.0 lb

## 2014-10-12 DIAGNOSIS — E2839 Other primary ovarian failure: Secondary | ICD-10-CM

## 2014-10-12 DIAGNOSIS — K219 Gastro-esophageal reflux disease without esophagitis: Secondary | ICD-10-CM | POA: Insufficient documentation

## 2014-10-12 DIAGNOSIS — Z01419 Encounter for gynecological examination (general) (routine) without abnormal findings: Secondary | ICD-10-CM

## 2014-10-12 DIAGNOSIS — Z0000 Encounter for general adult medical examination without abnormal findings: Secondary | ICD-10-CM

## 2014-10-12 DIAGNOSIS — E781 Pure hyperglyceridemia: Secondary | ICD-10-CM | POA: Insufficient documentation

## 2014-10-12 LAB — POCT URINALYSIS DIPSTICK
Bilirubin, UA: NEGATIVE
Glucose, UA: NEGATIVE
Ketones, UA: NEGATIVE
Leukocytes, UA: NEGATIVE
Nitrite, UA: NEGATIVE
Protein, UA: NEGATIVE
Urobilinogen, UA: NEGATIVE
pH, UA: 5

## 2014-10-12 NOTE — Progress Notes (Signed)
64 y.o. [id number] MarriedCaucasianF here for annual exam.  Has hand surgery this afternoon.  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  Having a lot of pain/numbness in the finger.    No vaginal bleeding.    Had   PCP:  Dr. Karie Kirks.  Last visit was about a year ago.  Has seen cardiologist (Dr. Arlee Muslim with [facility]) and Dr. Steffanie Dunn, endocrinologist, in [place] as well.    Patient's last menstrual period was 08/21/2002.          Sexually active: No.  The current method of family planning is status post hysterectomy.    Exercising: No.  not regularly Smoker:  no  Health Maintenance: Pap:  04/27/09 WNL History of abnormal Pap:  no MMG:  04/23/13 3D-BiRads 1-normal.  Aware due.  Pt states will schedule.   Colonoscopy:  2/16-repeat in 5 years and endoscopy BMD:   01/19/09 TDaP:  07/25/14 Screening Labs: Endocrinologist, Hb today: Endocrinologist, Urine today: neg   reports that she has never smoked. She has never used smokeless tobacco. She reports that she drinks about 0.6 - 1.2 oz of alcohol per week. She reports that she does not use illicit drugs.  Past Medical History  Diagnosis Date  . Thyroid disease     hypothyroid  . Chronic reflux esophagitis   . Pure hypercholesterolemia   . History of kidney stones   . Plantar fasciitis   . Hypertension   . Knee pain     right knee-seeing ortho    Past Surgical History  Procedure Laterality Date  . Appendectomy    . Colporrhaphy      posterior  . Tonsillectomy    . Tubal ligation Bilateral   . Ovarian cyst removal    . Varicose vein surgery    . Total vaginal hysterectomy  10/18/2009    rectocele repair, sling  . Rectocele repair  2011    w/TVH and sling    Current Outpatient Prescriptions  Medication Sig Dispense Refill  . Ascorbic Acid (VITAMIN C) 100 MG tablet Take 100 mg by mouth.    . Calcium Carb-Cholecalciferol (CALCIUM + D3) 600-200 MG-UNIT TABS Take 1 tablet by mouth.    .  famotidine (PEPCID) 40 MG tablet Take 40 mg by mouth.    . fenofibrate (TRICOR) 145 MG tablet Take 145 mg by mouth.    . levothyroxine (SYNTHROID) 112 MCG tablet Take 112 mcg by mouth.    Marland Kitchen LORazepam (ATIVAN) 0.5 MG tablet Take 0.5 mg by mouth.    . metoprolol succinate (TOPROL XL) 50 MG 24 hr tablet Take 50 mg by mouth.    . Multiple Vitamin (MULTIVITAMIN) tablet Take 1 tablet by mouth daily.    . vitamin B-12 (CYANOCOBALAMIN) 1000 MCG tablet Take 1,000 mcg by mouth.    . Vitamin D, Cholecalciferol, 1000 UNITS TABS Take 1,000 Units by mouth.    . TRAMADOL HCL PO Take by mouth.     No current facility-administered medications for this visit.    Family History  Problem Relation Age of Onset  . Cervical cancer Mother   . Heart failure Mother   . Heart failure Father   . Diabetes Father   . Breast cancer Maternal Aunt   . Breast cancer Paternal Aunt   . Cancer Brother     ROS:  Pertinent items are noted in HPI.  Otherwise, a comprehensive ROS was negative.  Exam:   BP 128/82 mmHg  Pulse 60  Resp 16  Ht 5' 5.5" (1.664 m)  Wt 175 lb (79.379 kg)  BMI 28.67 kg/m2  LMP 08/21/2002  Weight change: #6  Height: 5' 5.5" (166.4 cm)  Ht Readings from Last 3 Encounters:  10/12/14 5' 5.5" (1.664 m)  05/21/13 5' 5.5" (1.664 m)    General appearance: alert, cooperative and appears stated age Head: Normocephalic, without obvious abnormality, atraumatic Neck: no adenopathy, supple, symmetrical, trachea midline and thyroid normal to inspection and palpation Lungs: clear to auscultation bilaterally Breasts: normal appearance, no masses or tenderness Heart: regular rate and rhythm Abdomen: soft, non-tender; bowel sounds normal; no masses,  no organomegaly Extremities: extremities normal, atraumatic, no cyanosis or edema Skin: Skin color, texture, turgor normal. No rashes or lesions Lymph nodes: Cervical, supraclavicular, and axillary nodes normal. No abnormal inguinal nodes  palpated Neurologic: Grossly normal   Pelvic: External genitalia:  no lesions              Urethra:  normal appearing urethra with no masses, tenderness or lesions              Bartholins and Skenes: normal                 Vagina: normal appearing vagina with normal color and discharge, no lesions              Cervix: absent              Pap taken: No. Bimanual Exam:  Uterus:  uterus absent              Adnexa: no mass, fullness, tenderness               Rectovaginal: Confirms               Anus:  normal sphincter tone, no lesions  Chaperone was present for exam.  A:  Well Woman with normal exam S/P TVH and sling for prolapse and rectocele repair 09/2009 Hypothyroid Hypertension Elevated triglycerides  P: Mammogram due.  Pt aware and states she will schedule. BMD with MMG.  Order placed.  Pt aware. No pap today.  H/O TVH.  Normal physical exam today Release of records signed for copy of colonoscopy  Pt will see PCP and do labs in the fall.   Follow up 1 year or PRN new problems

## 2015-04-19 ENCOUNTER — Other Ambulatory Visit: Payer: Self-pay | Admitting: Gastroenterology

## 2015-04-19 DIAGNOSIS — N281 Cyst of kidney, acquired: Secondary | ICD-10-CM

## 2015-05-02 ENCOUNTER — Other Ambulatory Visit: Payer: Self-pay

## 2015-05-02 DIAGNOSIS — Z1231 Encounter for screening mammogram for malignant neoplasm of breast: Secondary | ICD-10-CM

## 2015-05-17 ENCOUNTER — Ambulatory Visit
Admission: RE | Admit: 2015-05-17 | Discharge: 2015-05-17 | Disposition: A | Discharge status: Home / Self Care | Payer: BLUE CROSS/BLUE SHIELD | Source: Ambulatory Visit | Attending: Gastroenterology | Admitting: Gastroenterology

## 2015-05-17 ENCOUNTER — Ambulatory Visit: Payer: BLUE CROSS/BLUE SHIELD

## 2015-05-17 DIAGNOSIS — N281 Cyst of kidney, acquired: Secondary | ICD-10-CM

## 2015-06-14 ENCOUNTER — Ambulatory Visit
Admission: RE | Admit: 2015-06-14 | Discharge: 2015-06-14 | Disposition: A | Discharge status: Home / Self Care | Payer: BLUE CROSS/BLUE SHIELD | Source: Ambulatory Visit

## 2015-06-14 DIAGNOSIS — Z1231 Encounter for screening mammogram for malignant neoplasm of breast: Secondary | ICD-10-CM

## 2015-11-17 DIAGNOSIS — I10 Essential (primary) hypertension: Secondary | ICD-10-CM | POA: Insufficient documentation

## 2015-11-17 DIAGNOSIS — R635 Abnormal weight gain: Secondary | ICD-10-CM | POA: Insufficient documentation

## 2015-11-17 DIAGNOSIS — E669 Obesity, unspecified: Secondary | ICD-10-CM | POA: Insufficient documentation

## 2016-01-24 ENCOUNTER — Ambulatory Visit: Payer: BLUE CROSS/BLUE SHIELD | Admitting: Obstetrics & Gynecology

## 2016-03-06 ENCOUNTER — Encounter: Payer: Self-pay | Admitting: Obstetrics & Gynecology

## 2016-03-06 ENCOUNTER — Ambulatory Visit (INDEPENDENT_AMBULATORY_CARE_PROVIDER_SITE_OTHER): Payer: 59 | Admitting: Obstetrics & Gynecology

## 2016-03-06 VITALS — BP 118/78 | HR 70 | Resp 12 | Ht 65.25 in | Wt 174.8 lb

## 2016-03-06 DIAGNOSIS — Z01419 Encounter for gynecological examination (general) (routine) without abnormal findings: Secondary | ICD-10-CM

## 2016-03-06 DIAGNOSIS — Z124 Encounter for screening for malignant neoplasm of cervix: Secondary | ICD-10-CM

## 2016-03-06 MED ORDER — RABEPRAZOLE SODIUM 20 MG PO TBEC: 20.0000 mg | DELAYED_RELEASE_TABLET | ORAL | Status: AC

## 2016-03-06 NOTE — Progress Notes (Signed)
66 y.o. [id number] Married Caucasian F here for annual exam.  Doing well.  No vaginal bleeding.  Actively working on weight loss.  Total cholesterol 267.  HbA1C was 5.9.  Having right knee issues.  Seeing Dr. Alvan Dame who has recommended she start considering this.    PCP:  Dr.  Karie Kirks  Patient's last menstrual period was 08/21/2002.          Sexually active: Yes.    The current method of family planning is status post hysterectomy.    Exercising: Yes.    walking Smoker:  no  Health Maintenance: Pap:  2011- normal  History of abnormal Pap:  no MMG:  06/14/15 BIRADS 1 negative  Colonoscopy:  06/07/14 polyps- repeat 5 years  BMD:   01/19/09 normal   TDaP:  04/27/09 Pneumonia vaccine(s): PCP Zostavax:   PCP (pt thinks she's done it) Hep C testing: Pt has this planned with next blood work Screening Labs: PCP, Hb today: PCP, Urine today: PCP   reports that she has never smoked. She has never used smokeless tobacco. She reports that she drinks alcohol. She reports that she does not use drugs.  Past Medical History:  Diagnosis Date  . Chronic reflux esophagitis   . History of kidney stones   . Hypertension   . Knee pain    right knee-seeing ortho  . Plantar fasciitis   . Pure hypercholesterolemia   . Thyroid disease    hypothyroid    Past Surgical History:  Procedure Laterality Date  . APPENDECTOMY    . COLPORRHAPHY     posterior  . OVARIAN CYST REMOVAL    . RECTOCELE REPAIR  2011   w/TVH and sling  . TONSILLECTOMY    . TOTAL VAGINAL HYSTERECTOMY  10/18/2009   rectocele repair, sling  . TUBAL LIGATION Bilateral   . VARICOSE VEIN SURGERY      Current Outpatient Prescriptions  Medication Sig Dispense Refill  . Ascorbic Acid (VITAMIN C) 100 MG tablet Take 100 mg by mouth.    . Calcium Carb-Cholecalciferol (CALCIUM + D3) 600-200 MG-UNIT TABS Take 1 tablet by mouth.    . famotidine (PEPCID) 40 MG tablet Take 40 mg by mouth.    Marland Kitchen LORazepam (ATIVAN) 0.5 MG tablet Take 0.5 mg by  mouth.    . Multiple Vitamin (MULTIVITAMIN) tablet Take 1 tablet by mouth daily.    . vitamin B-12 (CYANOCOBALAMIN) 1000 MCG tablet Take 1,000 mcg by mouth.    . Vitamin D, Cholecalciferol, 1000 UNITS TABS Take 1,000 Units by mouth.    . fenofibrate (TRICOR) 145 MG tablet Take 145 mg by mouth.    . levothyroxine (SYNTHROID) 112 MCG tablet Take 112 mcg by mouth.    . metoprolol succinate (TOPROL XL) 50 MG 24 hr tablet Take 50 mg by mouth.     No current facility-administered medications for this visit.     Family History  Problem Relation Age of Onset  . Cervical cancer Mother   . Heart failure Mother   . Heart failure Father   . Diabetes Father   . Breast cancer Maternal Aunt   . Breast cancer Paternal Aunt   . Cancer Brother     ROS:  Pertinent items are noted in HPI.  Otherwise, a comprehensive ROS was negative.  Exam:   BP 118/78 (BP Location: Left Arm, Patient Position: Sitting, Cuff Size: Normal)   Pulse 70   Resp 12   Ht 5' 5.25" (1.657 m)   Wt 174  lb 12.8 oz (79.3 kg)   LMP 08/21/2002   BMI 28.87 kg/m   Weight change: -1#   Height: 5' 5.25" (165.7 cm)  Ht Readings from Last 3 Encounters:  03/06/16 5' 5.25" (1.657 m)  10/12/14 5' 5.5" (1.664 m)  05/21/13 5' 5.5" (1.664 m)    General appearance: alert, cooperative and appears stated age Head: Normocephalic, without obvious abnormality, atraumatic Neck: no adenopathy, supple, symmetrical, trachea midline and thyroid normal to inspection and palpation Lungs: clear to auscultation bilaterally Breasts: normal appearance, no masses or tenderness Heart: regular rate and rhythm Abdomen: soft, non-tender; bowel sounds normal; no masses,  no organomegaly Extremities: extremities normal, atraumatic, no cyanosis or edema Skin: Skin color, texture, turgor normal. No rashes or lesions Lymph nodes: Cervical, supraclavicular, and axillary nodes normal. No abnormal inguinal nodes palpated Neurologic: Grossly normal   Pelvic:  External genitalia:  no lesions              Urethra:  normal appearing urethra with no masses, tenderness or lesions              Bartholins and Skenes: normal                 Vagina: normal appearing vagina with normal color and discharge, no lesions              Cervix: absent              Pap taken: Yes.   Bimanual Exam:  Uterus:  uterus absent              Adnexa: no mass, fullness, tenderness               Rectovaginal: Confirms               Anus:  normal sphincter tone, no lesions  Chaperone was present for exam.  A:       Well Woman with normal exam S/P TVH and sling for prolapse and rectocele repair 09/2009 Hypothyroid Hypertension Elevated triglycerides  P: Mammogram due. Pap done today.  Pt and I discussed guidelines and agreed to proceed with pap smear today. Pt is going to have Hep C testing with lab work that is scheduled She will also check about the shingles vaccine to make sure it has been done Additional lab work and vaccines done with PCP  Follow up 1 year or PRN new problems

## 2016-03-07 LAB — IPS PAP TEST WITH REFLEX TO HPV

## 2016-03-23 ENCOUNTER — Other Ambulatory Visit: Payer: Self-pay | Admitting: Nurse Practitioner

## 2016-03-23 NOTE — Telephone Encounter (Signed)
Medication refill request: Vitamin D 50,000 IU Last AEX:  03/06/16 SM Next AEX: 06/06/17 SM Last MMG (if hormonal medication request): 06/14/15 BIRADS1, Density B Refill authorized: Historical entry for Vit D 1,000 IU

## 2016-06-19 ENCOUNTER — Other Ambulatory Visit: Payer: Self-pay | Admitting: Obstetrics & Gynecology

## 2016-06-19 DIAGNOSIS — Z1231 Encounter for screening mammogram for malignant neoplasm of breast: Secondary | ICD-10-CM

## 2016-06-27 ENCOUNTER — Ambulatory Visit
Admission: RE | Admit: 2016-06-27 | Discharge: 2016-06-27 | Disposition: A | Discharge status: Home / Self Care | Payer: 59 | Source: Ambulatory Visit | Attending: Obstetrics & Gynecology | Admitting: Obstetrics & Gynecology

## 2016-06-27 DIAGNOSIS — Z1231 Encounter for screening mammogram for malignant neoplasm of breast: Secondary | ICD-10-CM

## 2017-05-17 DIAGNOSIS — M25551 Pain in right hip: Secondary | ICD-10-CM | POA: Insufficient documentation

## 2017-06-06 ENCOUNTER — Ambulatory Visit (INDEPENDENT_AMBULATORY_CARE_PROVIDER_SITE_OTHER): Payer: 59 | Admitting: Obstetrics & Gynecology

## 2017-06-06 ENCOUNTER — Encounter: Payer: Self-pay | Admitting: Obstetrics & Gynecology

## 2017-06-06 ENCOUNTER — Other Ambulatory Visit: Payer: Self-pay

## 2017-06-06 VITALS — BP 124/76 | HR 76 | Resp 14 | Ht 65.5 in | Wt 181.0 lb

## 2017-06-06 DIAGNOSIS — Z01419 Encounter for gynecological examination (general) (routine) without abnormal findings: Secondary | ICD-10-CM | POA: Diagnosis not present

## 2017-06-06 DIAGNOSIS — E2839 Other primary ovarian failure: Secondary | ICD-10-CM | POA: Diagnosis not present

## 2017-06-06 NOTE — Progress Notes (Signed)
67 y.o. [id number] MarriedCaucasianF here for annual exam.  Having some knee issues.  Has osteoarthritis.  Taking some anti-inflammatories and having some injections.  Not ready for a knee replacement.    Denies vaginal bleeding.    PCP:  Dr. Karie Kirks  Blood work was done 3 months ago.  Patient's last menstrual period was 08/21/2002.          Sexually active: No.  The current method of family planning is status post hysterectomy.    Exercising: No.  The patient does not participate in regular exercise at present. Smoker:  no  Health Maintenance: Pap:  03/06/16 Neg   04/22/08 neg  History of abnormal Pap:  no MMG:  06/27/16 BIRADS1:Neg  Colonoscopy:  06/07/14, adenomatous polyp.  Will have follow-up 5 years. BMD:   01/19/09 Normal  TDaP:  2011 Pneumonia vaccine(s):  done Shingrix:   Completed series Hep C testing: done Screening Labs: PCP   reports that she has never smoked. She has never used smokeless tobacco. She reports that she drinks alcohol. She reports that she does not use drugs.  Past Medical History:  Diagnosis Date  . Bursitis   . Chronic reflux esophagitis   . History of kidney stones   . Hypertension   . Knee pain    right knee-seeing ortho  . Osteoarthritis   . Plantar fasciitis   . Pure hypercholesterolemia   . Thyroid disease    hypothyroid    Past Surgical History:  Procedure Laterality Date  . APPENDECTOMY    . CARPAL TUNNEL RELEASE Right 2011   Dr. Amedeo Plenty  . COLPORRHAPHY     posterior  . HAND SURGERY Right 2016   Nerve surgery, Dr. Amedeo Plenty  . OVARIAN CYST REMOVAL    . RECTOCELE REPAIR  2011   w/TVH and sling  . TONSILLECTOMY    . TOTAL VAGINAL HYSTERECTOMY  10/18/2009   rectocele repair, sling  . TUBAL LIGATION Bilateral   . VARICOSE VEIN SURGERY      Current Outpatient Medications  Medication Sig Dispense Refill  . famotidine (PEPCID) 40 MG tablet Take 40 mg by mouth.    . fenofibrate (TRICOR) 145 MG tablet Take 145 mg by mouth.    .  levothyroxine (SYNTHROID) 112 MCG tablet Take 112 mcg by mouth.    Marland Kitchen LORazepam (ATIVAN) 0.5 MG tablet Take 0.5 mg by mouth.    . meloxicam (MOBIC) 15 MG tablet daily as needed.    . RABEprazole (ACIPHEX) 20 MG tablet Take 1 tablet (20 mg total) by mouth 2 (two) times a week.    . Turmeric 500 MG CAPS daily.    . metoprolol succinate (TOPROL XL) 50 MG 24 hr tablet Take 50 mg by mouth.     No current facility-administered medications for this visit.     Family History  Problem Relation Age of Onset  . Cervical cancer Mother   . Heart failure Mother   . Heart failure Father   . Diabetes Father   . Cancer Brother   . Breast cancer Maternal Aunt   . Breast cancer Paternal Aunt     Review of Systems  All other systems reviewed and are negative.   Exam:   BP 124/76 (BP Location: Right Arm, Patient Position: Sitting, Cuff Size: Large)   Pulse 76   Resp 14   Ht 5' 5.5" (1.664 m)   Wt 181 lb (82.1 kg)   LMP 08/21/2002   BMI 29.66 kg/m  Height: 5' 5.5" (166.4 cm)  Ht Readings from Last 3 Encounters:  06/06/17 5' 5.5" (1.664 m)  03/06/16 5' 5.25" (1.657 m)  10/12/14 5' 5.5" (1.664 m)    General appearance: alert, cooperative and appears stated age Head: Normocephalic, without obvious abnormality, atraumatic Neck: no adenopathy, supple, symmetrical, trachea midline and thyroid normal to inspection and palpation Lungs: clear to auscultation bilaterally Breasts: normal appearance, no masses or tenderness Heart: regular rate and rhythm Abdomen: soft, non-tender; bowel sounds normal; no masses,  no organomegaly Extremities: extremities normal, atraumatic, no cyanosis or edema Skin: Skin color, texture, turgor normal. No rashes or lesions Lymph nodes: Cervical, supraclavicular, and axillary nodes normal. No abnormal inguinal nodes palpated Neurologic: Grossly normal   Pelvic: External genitalia:  no lesions              Urethra:  normal appearing urethra with no masses,  tenderness or lesions              Bartholins and Skenes: normal                 Vagina: normal appearing vagina with normal color and discharge, no lesions              Cervix: no lesions              Pap taken: No. Bimanual Exam:  Uterus:  normal size, contour, position, consistency, mobility, non-tender              Adnexa: normal adnexa and no mass, fullness, tenderness               Rectovaginal: Confirms               Anus:  normal sphincter tone, no lesions  Chaperone was present for exam.  A:  Well Woman with normal exam PMP, no HRT S/p TVH and mid-urethral sling for prolapse and rectocele repair 8/11 Hypothyroidism Hypertension Elevated triglycerides  P:   Mammogram guidelines reviewed Order for BMD placed pap smear 2018 was negative.  Not indicated today. Lab work and vaccines are UTD.  Aware to finish pneumonia vaccination series. return annually or prn

## 2017-07-03 DIAGNOSIS — M79672 Pain in left foot: Secondary | ICD-10-CM | POA: Insufficient documentation

## 2017-07-16 ENCOUNTER — Other Ambulatory Visit: Payer: Self-pay | Admitting: Nurse Practitioner

## 2017-07-16 DIAGNOSIS — R200 Anesthesia of skin: Secondary | ICD-10-CM

## 2017-07-17 ENCOUNTER — Ambulatory Visit
Admission: RE | Admit: 2017-07-17 | Discharge: 2017-07-17 | Disposition: A | Discharge status: Home / Self Care | Payer: 59 | Source: Ambulatory Visit | Attending: Nurse Practitioner | Admitting: Nurse Practitioner

## 2017-07-17 ENCOUNTER — Other Ambulatory Visit: Payer: 59

## 2017-07-17 DIAGNOSIS — R200 Anesthesia of skin: Secondary | ICD-10-CM

## 2017-07-18 ENCOUNTER — Other Ambulatory Visit: Payer: Self-pay | Admitting: Nurse Practitioner

## 2017-07-18 DIAGNOSIS — R200 Anesthesia of skin: Secondary | ICD-10-CM

## 2017-07-18 DIAGNOSIS — R208 Other disturbances of skin sensation: Secondary | ICD-10-CM

## 2017-07-22 ENCOUNTER — Other Ambulatory Visit: Payer: Self-pay | Admitting: Nurse Practitioner

## 2017-07-22 DIAGNOSIS — M5416 Radiculopathy, lumbar region: Secondary | ICD-10-CM

## 2017-07-23 ENCOUNTER — Encounter: Payer: Self-pay | Admitting: Diagnostic Neuroimaging

## 2017-07-24 ENCOUNTER — Encounter: Payer: Self-pay | Admitting: Diagnostic Neuroimaging

## 2017-07-24 ENCOUNTER — Ambulatory Visit (INDEPENDENT_AMBULATORY_CARE_PROVIDER_SITE_OTHER): Payer: 59 | Admitting: Diagnostic Neuroimaging

## 2017-07-24 ENCOUNTER — Telehealth: Payer: Self-pay | Admitting: Diagnostic Neuroimaging

## 2017-07-24 VITALS — BP 137/83 | HR 84 | Ht 65.5 in | Wt 191.2 lb

## 2017-07-24 DIAGNOSIS — R208 Other disturbances of skin sensation: Secondary | ICD-10-CM | POA: Diagnosis not present

## 2017-07-24 DIAGNOSIS — R200 Anesthesia of skin: Secondary | ICD-10-CM

## 2017-07-24 NOTE — Progress Notes (Signed)
[facility]  PATIENT: Linda Woods DOB: 1950-03-13  REFERRING CLINICIAN: Vickey Sages, MD HISTORY FROM: patient  REASON FOR VISIT: new consult    HISTORICAL  CHIEF COMPLAINT:  Chief Complaint  Patient presents with  . Numbness    rm 7, New Pt, "numbness of left foot x 3-4 weeks"    HISTORY OF PRESENT ILLNESS:   67 year old right-handed female with hypertension, hyperglycemia, here for evaluation of left foot numbness.  3 to 4 weeks ago patient was sitting at home when all of a sudden she felt numbness in her left heel.  Symptoms have persisted since that time.  She also noted some progressive numbness affecting the left lateral part of her foot and digits 4 and 5.  She has noticed some weakness and incoordination with the left foot.  She feels that her left foot is "dragging".  Sometimes she has shooting pains that move from her left foot up her left calf.  Patient has chronic low back pain with symptoms of pain shooting into her right leg at times.  She had MRI of the lumbar spine recently which shows multilevel degenerative changes, mainly at L3-4 with mild spinal stenosis and left lateral recess stenosis.  Patient is scheduled for lumbar epidural steroid injection next week.  No problems with her left arm or left face.  No similar symptoms like this in the past.  Patient has been a little bit more active in the last 1 month physically.   REVIEW OF SYSTEMS: Full 14 system review of systems performed and negative with exception of: Weight gain swelling in legs numbness restless legs joint pain decreased energy.  ALLERGIES: Allergies  Allergen Reactions  . Statins     Joint pain    HOME MEDICATIONS: Outpatient Medications Prior to Visit  Medication Sig Dispense Refill  . famotidine (PEPCID) 40 MG tablet Take 40 mg by mouth.    . fenofibrate (TRICOR) 145 MG tablet Take 145 mg by mouth.    . levothyroxine (SYNTHROID) 112 MCG tablet Take 112 mcg by  mouth.    Marland Kitchen LORazepam (ATIVAN) 0.5 MG tablet Take 0.5 mg by mouth.    . meloxicam (MOBIC) 15 MG tablet daily as needed.    . metoprolol succinate (TOPROL XL) 50 MG 24 hr tablet Take 50 mg by mouth.    . RABEprazole (ACIPHEX) 20 MG tablet Take 1 tablet (20 mg total) by mouth 2 (two) times a week.    . Turmeric 500 MG CAPS daily.     No facility-administered medications prior to visit.     PAST MEDICAL HISTORY: Past Medical History:  Diagnosis Date  . Bursitis   . Chronic reflux esophagitis   . Diverticulosis   . GERD (gastroesophageal reflux disease)   . History of kidney stones   . Hypertension   . Knee pain    right knee-seeing ortho  . Osteoarthritis   . Plantar fasciitis   . Pure hypercholesterolemia   . RLS (restless legs syndrome)   . Thyroid disease    hypothyroid    PAST SURGICAL HISTORY: Past Surgical History:  Procedure Laterality Date  . APPENDECTOMY    . CARPAL TUNNEL RELEASE Right 2011   Dr. Amedeo Plenty  . COLPORRHAPHY     posterior  . HAND SURGERY Right 2016   Nerve surgery, Dr. Amedeo Plenty  . OVARIAN CYST REMOVAL    . RECTOCELE REPAIR  2011   w/TVH and sling  . TONSILLECTOMY    . TOTAL VAGINAL  HYSTERECTOMY  10/18/2009   rectocele repair, sling  . TUBAL LIGATION Bilateral   . VARICOSE VEIN SURGERY      FAMILY HISTORY: Family History  Problem Relation Age of Onset  . Cervical cancer Mother   . Heart failure Mother   . Heart failure Father   . Diabetes Father   . Cancer Brother   . Breast cancer Maternal Aunt   . Breast cancer Paternal Aunt     SOCIAL HISTORY:  Social History   Socioeconomic History  . Marital status: Married    Spouse name: Not on file  . Number of children: 3  . Years of education: Not on file  . Highest education level: Not on file  Occupational History    Employer: SELF EMPLOYED  Social Needs  . Financial resource strain: Not on file  . Food insecurity:    Worry: Not on file    Inability: Not on file  . Transportation  needs:    Medical: Not on file    Non-medical: Not on file  Tobacco Use  . Smoking status: Never Smoker  . Smokeless tobacco: Never Used  Substance and Sexual Activity  . Alcohol use: Yes    Alcohol/week: 0.0 oz    Comment: wine  . Drug use: No  . Sexual activity: Not Currently    Partners: Male    Birth control/protection: Surgical    Comment: Hysterectomy, BTL  Lifestyle  . Physical activity:    Days per week: Not on file    Minutes per session: Not on file  . Stress: Not on file  Relationships  . Social connections:    Talks on phone: Not on file    Gets together: Not on file    Attends religious service: Not on file    Active member of club or organization: Not on file    Attends meetings of clubs or organizations: Not on file    Relationship status: Not on file  . Intimate partner violence:    Fear of current or ex partner: Not on file    Emotionally abused: Not on file    Physically abused: Not on file    Forced sexual activity: Not on file  Other Topics Concern  . Not on file  Social History Narrative   Married, retired Copywriter, advertising   Caffeine- coffee, 1 cup, occass tea   Education- BS   Children- 3     PHYSICAL EXAM  GENERAL EXAM/CONSTITUTIONAL: Vitals:  Vitals:   07/24/17 1110  BP: 137/83  Pulse: 84  Weight: 191 lb 3.2 oz (86.7 kg)  Height: 5' 5.5" (1.664 m)     Body mass index is 31.33 kg/m.  No exam data present  Patient is in no distress; well developed, nourished and groomed; neck is supple  STRAIGHT LEG RAISE NEG  CARDIOVASCULAR:  Examination of carotid arteries is normal; no carotid bruits  Regular rate and rhythm, no murmurs  Examination of peripheral vascular system by observation and palpation is normal  EYES:  Ophthalmoscopic exam of optic discs and posterior segments is normal; no papilledema or hemorrhages  MUSCULOSKELETAL:  Gait, strength, tone, movements noted in Neurologic exam below  NEUROLOGIC: MENTAL  STATUS:  No flowsheet data found.  awake, alert, oriented to person, place and time  recent and remote memory intact  normal attention and concentration  language fluent, comprehension intact, naming intact,   fund of knowledge appropriate  CRANIAL NERVE:   2nd - no papilledema on fundoscopic exam  2nd, 3rd, 4th, 6th - pupils equal and reactive to light, visual fields full to confrontation, extraocular muscles intact, no nystagmus  5th - facial sensation symmetric  7th - facial strength symmetric  8th - hearing intact  9th - palate elevates symmetrically, uvula midline  11th - shoulder shrug symmetric  12th - tongue protrusion midline  MOTOR:   normal bulk and tone, full strength in the BUE, BLE  SENSORY:   normal and symmetric to light touch, pinprick, temperature, vibration; EXCEPT DECR IN LEFT HEEL AND LEFT LATERAL FOOT TO PP AND TEMP  COORDINATION:   finger-nose-finger, fine finger movements normal  REFLEXES:   deep tendon reflexes TRACE and symmetric  GAIT/STATION:   narrow based gait; SLIGHT DIFF WITH LEFT FOOT HEEL AND TOE WALKING; romberg is negative    DIAGNOSTIC DATA (LABS, IMAGING, TESTING) - I reviewed patient records, labs, notes, testing and imaging myself where available.  Lab Results  Component Value Date   WBC 8.6 10/19/2009   HGB 13.5 05/21/2013   HCT 31.0 (L) 10/19/2009   MCV 93.7 10/19/2009   PLT 238 10/19/2009      Component Value Date/Time   NA 140 10/19/2009 0400   K 4.2 10/19/2009 0400   CL 107 10/19/2009 0400   CO2 26 10/19/2009 0400   GLUCOSE 149 (H) 10/19/2009 0400   BUN 6 10/19/2009 0400   CREATININE 0.79 10/19/2009 0400   CALCIUM 8.8 10/19/2009 0400   PROT 7.6 10/14/2009 0900   ALBUMIN 4.3 10/14/2009 0900   AST 26 10/14/2009 0900   ALT 24 10/14/2009 0900   ALKPHOS 69 10/14/2009 0900   BILITOT 0.8 10/14/2009 0900   GFRNONAA >60 10/19/2009 0400   GFRAA  10/19/2009 0400    >60        The eGFR has been  calculated using the MDRD equation. This calculation has not been validated in all clinical situations. eGFR's persistently <60 mL/min signify possible Chronic Kidney Disease.   No results found for: CHOL, HDL, LDLCALC, LDLDIRECT, TRIG, CHOLHDL No results found for: HGBA1C No results found for: VITAMINB12 No results found for: TSH   07/17/17 MRI lumbar spine [I reviewed images myself and agree with interpretation. Mild spinal stenosis at L3-4. -VRP]  - Chronic degenerative changes throughout the lumbar region as outlined above. The findings could certainly be associated with chronic back pain. I do not see distinct neural compression on either side. Specifically, a clear explanation for left lower extremity numbness is not identified. The most suspicious location would be the subarticular lateral recess on the left at L3-4 where there would be some potential that the left L4 nerve could be affected.     ASSESSMENT AND PLAN  67 y.o. year old female here with new onset left heel and left foot numbness, mild weakness and incoordination, starting approximately 1 month ago.  Symptoms were fairly sudden onset without provoking factor.  Timing of symptoms raises possibility for vascular event such as stroke.  Also could represent lumbar radiculopathy or peripheral neuropathy.  We will proceed with further work-up.   Ddx: lumbar radiculitis vs peripheral neuropathy vs stroke  1. Numbness of left foot      PLAN:  - MRI brain (rule out stroke) - EMG/NCS (eval lumbar radic vs peripheral neuropathy) - agree with lumbar epidural injection (scheduled next week) - consider PT evaluation  Orders Placed This Encounter  Procedures  . MR BRAIN WO CONTRAST  . NCV with EMG(electromyography)   Return for for NCV/EMG.  Penni Bombard, MD 08/27/4782, 12:82 AM Certified in Neurology, Neurophysiology and Neuroimaging  [facility] [address] [place],   [address] [phone]

## 2017-07-24 NOTE — Telephone Encounter (Signed)
[employer] Auth: [id number] (exp. 07/24/17 to 09/07/17) order sent to GI. They will reach out to the pt to schedule.

## 2017-07-24 NOTE — Patient Instructions (Signed)
-   MRI brain   - EMG/NCS (electrical nerve testing)  - agree with lumbar epidural injection (scheduled next week)  - consider PT evaluation

## 2017-07-25 ENCOUNTER — Encounter (INDEPENDENT_AMBULATORY_CARE_PROVIDER_SITE_OTHER): Payer: 59 | Admitting: Diagnostic Neuroimaging

## 2017-07-25 ENCOUNTER — Ambulatory Visit (INDEPENDENT_AMBULATORY_CARE_PROVIDER_SITE_OTHER): Payer: 59 | Admitting: Diagnostic Neuroimaging

## 2017-07-25 DIAGNOSIS — Z0289 Encounter for other administrative examinations: Secondary | ICD-10-CM

## 2017-07-25 DIAGNOSIS — R208 Other disturbances of skin sensation: Secondary | ICD-10-CM | POA: Diagnosis not present

## 2017-07-25 DIAGNOSIS — R200 Anesthesia of skin: Secondary | ICD-10-CM

## 2017-07-26 NOTE — Procedures (Signed)
   [facility]  NCS (NERVE CONDUCTION STUDY) WITH EMG (ELECTROMYOGRAPHY) REPORT   STUDY DATE: 07/25/17 PATIENT NAME: Linda Woods DOB: 02-10-1951 MRN: [id number]  ORDERING CLINICIAN: Andrey Spearman, MD   TECHNOLOGIST: Oneita Jolly ELECTROMYOGRAPHER: Earlean Polka. Carrah Eppolito, MD  CLINICAL INFORMATION: 67 year old female with left foot numbness.  FINDINGS: NERVE CONDUCTION STUDY: Left peroneal left tibial motor responses are normal.  Left sural and superficial peroneal sensory responses are normal.  Left tibial F wave latencies normal.   NEEDLE ELECTROMYOGRAPHY:  Needle examination of left lower extremity is normal.   IMPRESSION:   This is a normal study.  No electrodiagnostic evidence of large fiber neuropathy or left lumbar radiculopathy at this time.    INTERPRETING PHYSICIAN:  Penni Bombard, MD Certified in Neurology, Neurophysiology and Neuroimaging  [facility] [address], [place], [place] [address] [phone]  [facility]    Nerve / Sites Muscle Latency Ref. Amplitude Ref. Rel Amp Segments Distance Velocity Ref. Area    ms ms mV mV %  cm m/s m/s mVms  L Peroneal - EDB     Ankle EDB 4.6 ?6.5 3.6 ?2.0 100 Ankle - EDB 9   10.0     Fib head EDB 10.9  3.3  92.8 Fib head - Ankle 28 45 ?44 10.0     Pop fossa EDB 13.1  3.2  96.6 Pop fossa - Fib head 10 45 ?44 9.8         Pop fossa - Ankle      L Tibial - AH     Ankle AH 4.4 ?5.8 5.1 ?4.0 100 Ankle - AH 9   14.4     Pop fossa AH 12.8  3.3  64.7 Pop fossa - Ankle 37 44 ?41 9.6         SNC    Nerve / Sites Rec. Site Peak Lat Ref.  Amp Ref. Segments Distance    ms ms V V  cm  L Sural - Ankle (Calf)     Calf Ankle 4.0 ?4.4 7 ?6 Calf - Ankle 14  L Superficial peroneal - Ankle     Lat leg Ankle 3.9 ?4.4 7 ?6 Lat leg - Ankle 14         F  Wave    Nerve F Lat Ref.   ms ms  L Tibial - AH 52.0 ?56.0       EMG full       EMG Summary Table    Spontaneous MUAP  Recruitment  Muscle IA Fib PSW Fasc Other Amp Dur. Poly Pattern  L. Vastus medialis Normal None None None _______ Normal Normal Normal Normal  L. Tibialis anterior Normal None None None _______ Normal Normal Normal Normal  L. Gastrocnemius (Medial head) Normal None None None _______ Normal Normal Normal Normal

## 2017-07-28 ENCOUNTER — Ambulatory Visit
Admission: RE | Admit: 2017-07-28 | Discharge: 2017-07-28 | Disposition: A | Discharge status: Home / Self Care | Payer: 59 | Source: Ambulatory Visit | Attending: Diagnostic Neuroimaging | Admitting: Diagnostic Neuroimaging

## 2017-07-28 DIAGNOSIS — R208 Other disturbances of skin sensation: Secondary | ICD-10-CM

## 2017-07-28 DIAGNOSIS — R200 Anesthesia of skin: Secondary | ICD-10-CM

## 2017-07-30 ENCOUNTER — Other Ambulatory Visit: Payer: 59

## 2017-07-31 ENCOUNTER — Other Ambulatory Visit: Payer: 59

## 2017-07-31 ENCOUNTER — Ambulatory Visit
Admission: RE | Admit: 2017-07-31 | Discharge: 2017-07-31 | Disposition: A | Discharge status: Home / Self Care | Payer: 59 | Source: Ambulatory Visit | Attending: Nurse Practitioner | Admitting: Nurse Practitioner

## 2017-07-31 DIAGNOSIS — M5416 Radiculopathy, lumbar region: Secondary | ICD-10-CM

## 2017-07-31 MED ORDER — IOPAMIDOL (ISOVUE-M 200) INJECTION 41%
1.0000 mL | Freq: Once | INTRAMUSCULAR | Status: AC
  Administered 2017-07-31: 1 mL via EPIDURAL

## 2017-07-31 MED ORDER — METHYLPREDNISOLONE ACETATE 40 MG/ML INJ SUSP (RADIOLOG
120.0000 mg | Freq: Once | INTRAMUSCULAR | Status: AC
  Administered 2017-07-31: 120 mg via EPIDURAL

## 2017-07-31 NOTE — Discharge Instructions (Signed)

## 2017-08-09 ENCOUNTER — Telehealth: Payer: Self-pay | Admitting: Diagnostic Neuroimaging

## 2017-08-09 DIAGNOSIS — R200 Anesthesia of skin: Secondary | ICD-10-CM

## 2017-08-09 DIAGNOSIS — R269 Unspecified abnormalities of gait and mobility: Secondary | ICD-10-CM

## 2017-08-09 DIAGNOSIS — R202 Paresthesia of skin: Secondary | ICD-10-CM

## 2017-08-09 NOTE — Telephone Encounter (Signed)
Non-specific scar tissue. No major findings. -VRP

## 2017-08-09 NOTE — Telephone Encounter (Signed)
Spoke with patient and informed her the MRI brain showed unremarkable imaging with no major findings. She has already had NCS and epidural injection which she stated did not help. She would like a PT evaluation at [facility]. She requests the referral be placed.  She stated she may also call a podiatrist. She verbalized understanding, appreciation of call.

## 2017-08-09 NOTE — Telephone Encounter (Signed)
PT referral placed. -VRP

## 2017-08-09 NOTE — Telephone Encounter (Signed)
Pt request MRI results.

## 2017-09-10 ENCOUNTER — Ambulatory Visit (INDEPENDENT_AMBULATORY_CARE_PROVIDER_SITE_OTHER): Payer: 59

## 2017-09-10 ENCOUNTER — Encounter: Payer: Self-pay | Admitting: Podiatry

## 2017-09-10 ENCOUNTER — Ambulatory Visit (INDEPENDENT_AMBULATORY_CARE_PROVIDER_SITE_OTHER): Payer: 59 | Admitting: Podiatry

## 2017-09-10 VITALS — BP 113/68 | HR 76 | Resp 16

## 2017-09-10 DIAGNOSIS — M722 Plantar fascial fibromatosis: Secondary | ICD-10-CM | POA: Diagnosis not present

## 2017-09-10 MED ORDER — CELECOXIB 200 MG PO CAPS: 200.0000 mg | ORAL_CAPSULE | Freq: Every day | ORAL | 2 refills | Status: DC

## 2017-09-10 NOTE — Progress Notes (Signed)
Subjective:  Patient ID: Linda Woods, female    DOB: Aug 01, 1950,  MRN: [id number] HPI Chief Complaint  Patient presents with  . Foot Pain    Plantar heel left - numbness x 2 months, throbbing by end of day, no AM pain, had vascular study - negative DVT, had knee checked, had strenghtening test (all testing negative), PCP rx'd tramadol, takes advil or naprosyn if needed  . New Patient (Initial Visit)    68 y.o. female presents with the above complaint.   ROS: Denies fever chills nausea vomiting muscle aches pains calf pain back pain chest pain shortness of breath.  Past Medical History:  Diagnosis Date  . Bursitis   . Chronic reflux esophagitis   . Diverticulosis   . GERD (gastroesophageal reflux disease)   . History of kidney stones   . Hypertension   . Knee pain    right knee-seeing ortho  . Osteoarthritis   . Plantar fasciitis   . Pure hypercholesterolemia   . RLS (restless legs syndrome)   . Thyroid disease    hypothyroid   Past Surgical History:  Procedure Laterality Date  . APPENDECTOMY    . CARPAL TUNNEL RELEASE Right 2011   Dr. Amedeo Plenty  . COLPORRHAPHY     posterior  . HAND SURGERY Right 2016   Nerve surgery, Dr. Amedeo Plenty  . OVARIAN CYST REMOVAL    . RECTOCELE REPAIR  2011   w/TVH and sling  . TONSILLECTOMY    . TOTAL VAGINAL HYSTERECTOMY  10/18/2009   rectocele repair, sling  . TUBAL LIGATION Bilateral   . VARICOSE VEIN SURGERY      Current Outpatient Medications:  .  celecoxib (CELEBREX) 200 MG capsule, Take 1 capsule (200 mg total) by mouth daily., Disp: 30 capsule, Rfl: 2 .  famotidine (PEPCID) 40 MG tablet, Take 40 mg by mouth., Disp: , Rfl:  .  fenofibrate (TRICOR) 145 MG tablet, Take 145 mg by mouth., Disp: , Rfl:  .  levothyroxine (SYNTHROID) 112 MCG tablet, Take 112 mcg by mouth., Disp: , Rfl:  .  LORazepam (ATIVAN) 0.5 MG tablet, Take 0.5 mg by mouth., Disp: , Rfl:  .  lovastatin (MEVACOR) 10 MG tablet, , Disp: , Rfl:  .  meloxicam (MOBIC) 15  MG tablet, daily as needed., Disp: , Rfl:  .  metoprolol succinate (TOPROL XL) 50 MG 24 hr tablet, Take 50 mg by mouth., Disp: , Rfl:  .  RABEprazole (ACIPHEX) 20 MG tablet, Take 1 tablet (20 mg total) by mouth 2 (two) times a week., Disp: , Rfl:  .  SYNTHROID 112 MCG tablet, , Disp: , Rfl:  .  traMADol (ULTRAM) 50 MG tablet, , Disp: , Rfl:  .  Turmeric 500 MG CAPS, daily., Disp: , Rfl:   Allergies  Allergen Reactions  . Statins     Joint pain   Review of Systems Objective:   Vitals:   09/10/17 0827  BP: 113/68  Pulse: 76  Resp: 16    General: Well developed, nourished, in no acute distress, alert and oriented x3   Dermatological: Skin is warm, dry and supple bilateral. Nails x 10 are well maintained; remaining integument appears unremarkable at this time. There are no open sores, no preulcerative lesions, no rash or signs of infection present.  Vascular: Dorsalis Pedis artery and Posterior Tibial artery pedal pulses are 2/4 bilateral with immedate capillary fill time. Pedal hair growth present. No varicosities and no lower extremity edema present bilateral.   Neruologic: Grossly  intact via light touch bilateral. Vibratory intact via tuning fork bilateral. Protective threshold with Semmes Wienstein monofilament intact to all pedal sites bilateral. Patellar and Achilles deep tendon reflexes 2+ bilateral. No Babinski or clonus noted bilateral.   Musculoskeletal: No gross boney pedal deformities bilateral. No pain, crepitus, or limitation noted with foot and ankle range of motion bilateral. Muscular strength 5/5 in all groups tested bilateral.  Gait: Unassisted, Nonantalgic.    Radiographs:  Radiographs taken of the left foot today demonstrate an osseously mature individual with a soft tissue increase in density of plantar fascial calcaneal insertion site with an increase in density of the plantar fascia almost sclerotic.  There is plantar distally oriented calcaneal heel spur  associated with this.  Assessment & Plan:   Assessment: Plantar fasciitis with entrapment of the tibial nerve as it courses beneath the port of pedis.  Plan: Has recently had a steroid Dosepak so will not give her another 1 of those I will change her meloxicam to Celebrex 200 mg 1 p.o. daily.  I also injected her left heel and placed her in a plantar fascial brace and a night splint.  I injected her left heel with 20 mg of Kenalog 5 mg Marcaine point maximal tenderness.  Tolerated procedure well without complications.  Follow-up with her in 1 month we did discuss the appropriate shoe gear stretching exercise ice therapy and shoe gear modifications.     [name] T. [place], [place]

## 2017-09-10 NOTE — Patient Instructions (Signed)

## 2017-10-05 ENCOUNTER — Other Ambulatory Visit: Payer: Self-pay | Admitting: Orthopedic Surgery

## 2017-10-05 DIAGNOSIS — M25562 Pain in left knee: Secondary | ICD-10-CM

## 2017-10-10 ENCOUNTER — Encounter: Payer: Self-pay | Admitting: Podiatry

## 2017-10-10 ENCOUNTER — Ambulatory Visit (INDEPENDENT_AMBULATORY_CARE_PROVIDER_SITE_OTHER): Payer: 59 | Admitting: Podiatry

## 2017-10-10 DIAGNOSIS — L600 Ingrowing nail: Secondary | ICD-10-CM

## 2017-10-10 DIAGNOSIS — M722 Plantar fascial fibromatosis: Secondary | ICD-10-CM

## 2017-10-10 MED ORDER — NEOMYCIN-POLYMYXIN-HC 1 % OT SOLN: OTIC | 1 refills | Status: DC

## 2017-10-10 NOTE — Progress Notes (Signed)
She presents today for follow-up of the left heel states that the brace really seems to help but the one at night, just cannot wear it.  She is also complaining about the hallux nails seem to be ingrown bilaterally.  Objective: Vital signs are stable she is alert and oriented x3.  Pulses are palpable.  She has pain on palpation medial calcaneal tubercle of her left heel.  She also has pain on palpation of the tibial borders of the hallux bilateral with erythema and edema sharp incurvated nail margins.  No purulence no malodor.  Assessment: Pain in limb secondary to ingrown toenails hallux bilateral and plantar fasciitis left.  Plan: Discussed etiology pathology and surgical therapies at this point I went ahead and injected 20 mg Kenalog 5 mg Marcaine point maximal tenderness of the left heel.  Also injected or infiltrated 3 cc of a 50-50 mixture of Marcaine plain and lidocaine plain around the hallux proximally.  After that chemical matrixectomy's were performed along the tibial border.  Tolerated procedure well without complications.  She was given both oral and written home-going instructions for care and soaking of her toe as well as prescription for Cortisporin Otic to be applied twice daily after soaking.  I will follow-up with her in 2 weeks to make sure she is doing well.  She will continue the use of her plantar fascial brace and night splint and anti-inflammatories.

## 2017-10-10 NOTE — Patient Instructions (Signed)

## 2017-10-14 ENCOUNTER — Other Ambulatory Visit: Payer: Self-pay | Admitting: Obstetrics & Gynecology

## 2017-10-14 DIAGNOSIS — Z1231 Encounter for screening mammogram for malignant neoplasm of breast: Secondary | ICD-10-CM

## 2017-10-15 ENCOUNTER — Ambulatory Visit
Admission: RE | Admit: 2017-10-15 | Discharge: 2017-10-15 | Disposition: A | Discharge status: Home / Self Care | Payer: 59 | Source: Ambulatory Visit | Attending: Obstetrics & Gynecology | Admitting: Obstetrics & Gynecology

## 2017-10-15 DIAGNOSIS — Z1231 Encounter for screening mammogram for malignant neoplasm of breast: Secondary | ICD-10-CM

## 2017-10-16 ENCOUNTER — Telehealth: Payer: Self-pay | Admitting: Obstetrics & Gynecology

## 2017-10-16 DIAGNOSIS — E2839 Other primary ovarian failure: Secondary | ICD-10-CM

## 2017-10-16 NOTE — Telephone Encounter (Signed)
Patient stated that she forgot to get the bone density test. When she remembered and called to schedule, the order had been canceled.

## 2017-10-16 NOTE — Telephone Encounter (Signed)
Last BMD 01/19/09 at [facility], Normal Last AEX 06/06/17  Order placed for BMD at [facility].  Call returned to patient, left detailed message, ok per dpr. Advised new order placed for BMD at [facility], may call them directly to schedule, or return call to office if assistance is needed or with any additional questions.   Routing to provider for final review. Patient is agreeable to disposition. Will close encounter.

## 2017-10-20 ENCOUNTER — Other Ambulatory Visit: Payer: 59

## 2017-10-22 ENCOUNTER — Ambulatory Visit (INDEPENDENT_AMBULATORY_CARE_PROVIDER_SITE_OTHER): Payer: 59

## 2017-10-22 DIAGNOSIS — L600 Ingrowing nail: Secondary | ICD-10-CM

## 2017-10-22 NOTE — Patient Instructions (Signed)

## 2017-10-23 ENCOUNTER — Ambulatory Visit
Admission: RE | Admit: 2017-10-23 | Discharge: 2017-10-23 | Disposition: A | Discharge status: Home / Self Care | Payer: 59 | Source: Ambulatory Visit | Attending: Orthopedic Surgery | Admitting: Orthopedic Surgery

## 2017-10-23 DIAGNOSIS — M25562 Pain in left knee: Secondary | ICD-10-CM

## 2017-10-25 ENCOUNTER — Ambulatory Visit
Admission: RE | Admit: 2017-10-25 | Discharge: 2017-10-25 | Disposition: A | Discharge status: Home / Self Care | Payer: 59 | Source: Ambulatory Visit | Attending: Obstetrics & Gynecology | Admitting: Obstetrics & Gynecology

## 2017-10-25 DIAGNOSIS — E2839 Other primary ovarian failure: Secondary | ICD-10-CM

## 2017-10-30 NOTE — Progress Notes (Signed)
Patient presents for follow-up appointment, ingrown toenail removal procedure was performed on bilateral hallux nails, procedure performed 10/10/2017.  She states that her nails feel fine she is not having any issues with them and she currently is not having any pain.  She does complain today of having numbness in her left heel, but denies pain in that area.  No erythema, no redness, no drainage, no other signs and symptoms of infection.  Hallux nails appear to be healing very well and are scabbing over at this time.  Advised patient to follow-up with Dr. Milinda Pointer in regards to the numbness in her heel.  Verbal and written instructions were given in regards to her toenail procedure.  She is to follow-up with any acute symptom changes.  Also discussed signs and symptoms of infection.

## 2018-04-11 ENCOUNTER — Other Ambulatory Visit: Payer: Self-pay

## 2018-04-11 ENCOUNTER — Ambulatory Visit (HOSPITAL_COMMUNITY): Payer: Managed Care, Other (non HMO) | Attending: Orthopedic Surgery

## 2018-04-11 ENCOUNTER — Encounter (HOSPITAL_COMMUNITY): Payer: Self-pay

## 2018-04-11 DIAGNOSIS — M25561 Pain in right knee: Secondary | ICD-10-CM | POA: Insufficient documentation

## 2018-04-11 DIAGNOSIS — R262 Difficulty in walking, not elsewhere classified: Secondary | ICD-10-CM | POA: Diagnosis present

## 2018-04-11 DIAGNOSIS — M25562 Pain in left knee: Secondary | ICD-10-CM | POA: Insufficient documentation

## 2018-04-11 DIAGNOSIS — R29898 Other symptoms and signs involving the musculoskeletal system: Secondary | ICD-10-CM | POA: Diagnosis present

## 2018-04-11 DIAGNOSIS — G8929 Other chronic pain: Secondary | ICD-10-CM | POA: Insufficient documentation

## 2018-04-11 NOTE — Therapy (Signed)
[place], [place], [address] Phone: [phone]   Fax:  [phone]  Physical Therapy Evaluation  Patient Details  Name: Linda Woods MRN: [id number] Date of Birth: 02-03-1951 Referring Provider (PT): Paralee Cancel, MD   Encounter Date: 04/11/2018  PT End of Session - 04/11/18 1350    Visit Number  1    Number of Visits  8    Date for PT Re-Evaluation  05/09/18    Authorization Type  [employer] (until end of Februrary 2020) and [employer]    Authorization Time Period  04/11/18 to 05/09/18    PT Start Time  1300    PT Stop Time  1346    PT Time Calculation (min)  46 min    Activity Tolerance  Patient limited by pain    Behavior During Therapy  [facility] for tasks assessed/performed       Past Medical History:  Diagnosis Date  . Bursitis   . Chronic reflux esophagitis   . Diverticulosis   . GERD (gastroesophageal reflux disease)   . History of kidney stones   . Hypertension   . Knee pain    right knee-seeing ortho  . Osteoarthritis   . Plantar fasciitis   . Pure hypercholesterolemia   . RLS (restless legs syndrome)   . Thyroid disease    hypothyroid    Past Surgical History:  Procedure Laterality Date  . APPENDECTOMY    . CARPAL TUNNEL RELEASE Right 2011   Dr. Amedeo Plenty  . COLPORRHAPHY     posterior  . HAND SURGERY Right 2016   Nerve surgery, Dr. Amedeo Plenty  . OVARIAN CYST REMOVAL    . RECTOCELE REPAIR  2011   w/TVH and sling  . TONSILLECTOMY    . TOTAL VAGINAL HYSTERECTOMY  10/18/2009   rectocele repair, sling  . TUBAL LIGATION Bilateral   . VARICOSE VEIN SURGERY      There were no vitals filed for this visit.   Subjective Assessment - 04/11/18 1304    Subjective  Pt states that she has been having bil knee pain for years. It started with her R knee which has very little cartilage and arthritis and she underwent L knee scope on 11/18/17 and states that she has had increased pain since. Her pain is located in the  joint/under the knee cap, medially, laterally, and then can radiate up. The L one is bothering her more but because she is putting more weight and use on the R leg, it is bothering her too. A TKA is not out of the question, she is just trying to prolong it and try to get some of her pain down. She has the most difficulty climbing stairs (ascending mostly) but her L knee can give way on her when doing down, twisting her knee when making a turn while standing on it really hurts. She has vascular issues the L leg as well. She reports she has plantar fascitis which has damaged the nerve.     Limitations  House hold activities;Walking    How long can you sit comfortably?  no issues    How long can you stand comfortably?  pain in WB    How long can you walk comfortably?  pain in WB    Patient Stated Goals  pain relief    Currently in Pain?  Yes    Pain Score  7    L is 6-7/10, R is 2-3/10   Pain Location  Knee    Pain Orientation  Left;Right    Pain Descriptors / Indicators  Sharp    Pain Type  Chronic pain    Pain Onset  More than a month ago    Pain Frequency  Intermittent    Aggravating Factors   WB, stairs, picking up something heavy    Pain Relieving Factors  rest    Effect of Pain on Daily Activities  increases         [facility] PT Assessment - 04/11/18 0001      Assessment   Medical Diagnosis  bil knee pain    Referring Provider (PT)  Paralee Cancel, MD    Onset Date/Surgical Date  --   L knee scope on 11/18/17; bil knee pain for years   Next MD Visit  going to try and get in in the next week to drain the fluid off L knee    Prior Therapy  none for knee pain      Balance Screen   Has the patient fallen in the past 6 months  No    Has the patient had a decrease in activity level because of a fear of falling?   Yes    Is the patient reluctant to leave their home because of a fear of falling?   No      Prior Function   Level of Independence  Independent    Vocation  Retired    Therapist, sports, play bridge      Observation/Other Assessments   Focus on Therapeutic Outcomes (FOTO)   to be completed at next visit      Observation/Other Assessments-Edema    Edema  Circumferential      Circumferential Edema   Circumferential - Right  36cm, joint line; 44.5cm at 5cm above patella    Circumferential - Left   36cm, joint line; 46.9cm at 5cm above patella      ROM / Strength   AROM / PROM / Strength  AROM;Strength      AROM   AROM Assessment Site  Knee    Right/Left Knee  Right;Left    Right Knee Extension  -4    Right Knee Flexion  130    Left Knee Extension  -5    Left Knee Flexion  125      Strength   Strength Assessment Site  Hip;Knee;Ankle    Right Hip Flexion  4+/5    Right Hip Extension  4/5    Right Hip ABduction  4/5    Left Hip Flexion  4+/5    Left Hip Extension  4-/5    Left Hip ABduction  4-/5    Right Knee Flexion  4+/5    Right Knee Extension  5/5    Left Knee Flexion  4-/5   pain   Left Knee Extension  5/5    Right Ankle Dorsiflexion  5/5    Left Ankle Dorsiflexion  5/5      Palpation   Patella mobility  slightly hypomobile sup/inf on L and tender to mobility; R generally WFL    Palpation comment  tightness along L quads      Special Tests    Special Tests  Meniscus Tests;Knee Special Tests    Knee Special tests   other    Meniscus Tests  other      other    Findings  Negative    Comments  anterior  and posterior drawer      other   Findings  Positive    Side  Left    Comments  Thessaly's + on L, - on R      Ambulation/Gait   Ambulation Distance (Feet)  498 Feet   3MWT   Assistive device  None    Gait Pattern  Step-through pattern;Decreased hip/knee flexion - left;Decreased stance time - left;Antalgic;Trendelenburg    Gait Comments  increased L knee pain      Balance   Balance Assessed  Yes      Static Standing Balance   Static Standing - Balance Support  No upper extremity supported     Static Standing Balance -  Activities   Single Leg Stance - Right Leg;Single Leg Stance - Left Leg    Static Standing - Comment/# of Minutes  R: 8.8sec or < L: 13sec or <      Standardized Balance Assessment   Standardized Balance Assessment  Five Times Sit to Stand    Five times sit to stand comments   15.6 sec, chair, no UE, weight shifted oof LLE            Objective measurements completed on examination: See above findings.         PT Education - 04/11/18 1349    Education Details  exam findings, HEP, POC    Person(s) Educated  Patient    Methods  Explanation;Demonstration;Handout    Comprehension  Verbalized understanding       PT Short Term Goals - 04/11/18 1413      PT SHORT TERM GOAL #1   Title  Pt will have reduced edema by 2cm at 5cm suprapatellar in order to reduce her pain and further normalize ROM.    Time  2    Period  Weeks    Status  New    Target Date  04/25/18      PT SHORT TERM GOAL #2   Title  Pt will be able to perform 5xSTS in 12 sec or < with proper form and 5/10 L knee pain or < to demo improved functional strength and balance.     Time  2    Period  Weeks    Status  New      PT SHORT TERM GOAL #3   Title  Pt will be able to perform bil SLS for 15sec or > in order to demo improved bil functional hip strength and maximize her stair ambulation.    Time  2    Period  Weeks        PT Long Term Goals - 04/11/18 1414      PT LONG TERM GOAL #1   Title  Pt will have improved MMT by 1/2 grade throughout all mm groups tested in order to reduce stress/strain on L knee joint and maximize her functional mobility.     Time  4    Period  Weeks    Status  New    Target Date  05/09/18      PT LONG TERM GOAL #2   Title  Pt will be able to perform 3MWT with 5/10 L knee pain or < to demo improved functional strength and improved tolerance to WB.     Time  4    Period  Weeks    Status  New      PT LONG TERM GOAL #3   Title  Pt will be able to  ambulate  full flight of stairs with minimal feelings of unsteadiness and weakness to demo improved functional quad strength and maximize community access.     Time  4    Period  Weeks    Status  New             Plan - 04/11/18 1411    Clinical Impression Statement  Pt is pleasant 68YO F who presents to OPPT with c/o bil knee pain, L>R. Pt with h/o L knee scope in September 2019 and reports increased pain ever since. She has deficits in suprapatellar edema (LLE), MMT, functional strength, functional mobility, balance, and increased pain, all worse on the L compared to the R. Very slight flexion difference noted on L knee compared to R but feel this is due to her increased swelling in L knee. Pt +for meniscus involvement/contributing to pain as she was +for Thessaly's sign. Audible crepitus/grinding heard throughout session in bil knees, L>R. PT encouraged pt to obtain [facility] to facilitate reduced pain with ambulation, steadiness during gait, and to reduce compensations so as to not increase LBP or hip pain with walking from favoring L knee pain. Pt reporting that she is trying conservative measures to prolong a TKA in her future. PT educated pt on her prognosis. Pt needs skilled PT intervention to address her impairments in order to reduce pain and improve function.    Clinical Presentation  Stable    Clinical Presentation due to:  see flowsheets for objective tests and measures    Clinical Decision Making  Low    Rehab Potential  Fair    PT Frequency  2x / week    PT Duration  4 weeks    PT Treatment/Interventions  ADLs/Self Care Home Management;Aquatic Therapy;Cryotherapy;Electrical Stimulation;Traction;Ultrasound;DME Instruction;Gait training;Stair training;Functional mobility training;Therapeutic activities;Therapeutic exercise;Balance training;Neuromuscular re-education;Patient/family education;Orthotic Fit/Training;Manual techniques;Passive range of motion;Dry needling;Scar mobilization;Taping     PT Next Visit Plan  reveiw goals, administer FOTO, begin strengthening and balance training as able within pain tolerance    PT Home Exercise Plan  eval: supine HS stretch (hands behind thigh), quad set, SAQ    Consulted and Agree with Plan of Care  Patient       Patient will benefit from skilled therapeutic intervention in order to improve the following deficits and impairments:  Abnormal gait, Decreased activity tolerance, Decreased balance, Decreased endurance, Decreased strength, Difficulty walking, Hypomobility, Increased edema, Increased fascial restricitons, Increased muscle spasms, Impaired flexibility, Improper body mechanics, Pain  Visit Diagnosis: Chronic pain of left knee - Plan: PT plan of care cert/re-cert  Chronic pain of right knee - Plan: PT plan of care cert/re-cert  Difficulty in walking, not elsewhere classified - Plan: PT plan of care cert/re-cert  Other symptoms and signs involving the musculoskeletal system - Plan: PT plan of care cert/re-cert     Problem List Patient Active Problem List   Diagnosis Date Noted  . Pain of left heel 07/03/2017  . Pain in joint of right hip 05/17/2017  . Benign essential HTN 11/17/2015  . Gastro-esophageal reflux disease without esophagitis 10/12/2014  . Hypertriglyceridemia 10/12/2014  . Hypertension 05/21/2013  . Unspecified hypothyroidism 05/21/2013  . Unspecified vitamin D deficiency 05/21/2013         Geraldine Solar PT, DPT    [place] [address] [place], [place], [address] Phone: [phone]   Fax:  [phone]  Name: Linda Woods MRN: [id number] Date of Birth: [birth year]

## 2018-04-16 ENCOUNTER — Ambulatory Visit (HOSPITAL_COMMUNITY): Payer: Managed Care, Other (non HMO) | Admitting: Physical Therapy

## 2018-04-16 DIAGNOSIS — G8929 Other chronic pain: Secondary | ICD-10-CM

## 2018-04-16 DIAGNOSIS — M25562 Pain in left knee: Principal | ICD-10-CM

## 2018-04-16 DIAGNOSIS — R29898 Other symptoms and signs involving the musculoskeletal system: Secondary | ICD-10-CM

## 2018-04-16 DIAGNOSIS — M25561 Pain in right knee: Secondary | ICD-10-CM

## 2018-04-16 DIAGNOSIS — R262 Difficulty in walking, not elsewhere classified: Secondary | ICD-10-CM

## 2018-04-16 NOTE — Therapy (Signed)
[place], [place], [address] Phone: [phone]   Fax:  [phone]  Physical Therapy Treatment  Patient Details  Name: Linda Woods MRN: [id number] Date of Birth: 1950-11-05 Referring Provider (PT): Paralee Cancel, MD   Encounter Date: 04/16/2018  PT End of Session - 04/16/18 1333    Visit Number  2    Number of Visits  8    Date for PT Re-Evaluation  05/09/18    Authorization Type  [employer] (until end of Februrary 2020) and [place] Time Period  04/11/18 to 05/09/18    PT Start Time  1035    PT Stop Time  1118    PT Time Calculation (min)  43 min    Activity Tolerance  Patient limited by pain    Behavior During Therapy  [facility] for tasks assessed/performed       Past Medical History:  Diagnosis Date  . Bursitis   . Chronic reflux esophagitis   . Diverticulosis   . GERD (gastroesophageal reflux disease)   . History of kidney stones   . Hypertension   . Knee pain    right knee-seeing ortho  . Osteoarthritis   . Plantar fasciitis   . Pure hypercholesterolemia   . RLS (restless legs syndrome)   . Thyroid disease    hypothyroid    Past Surgical History:  Procedure Laterality Date  . APPENDECTOMY    . CARPAL TUNNEL RELEASE Right 2011   Dr. Amedeo Plenty  . COLPORRHAPHY     posterior  . HAND SURGERY Right 2016   Nerve surgery, Dr. Amedeo Plenty  . OVARIAN CYST REMOVAL    . RECTOCELE REPAIR  2011   w/TVH and sling  . TONSILLECTOMY    . TOTAL VAGINAL HYSTERECTOMY  10/18/2009   rectocele repair, sling  . TUBAL LIGATION Bilateral   . VARICOSE VEIN SURGERY      There were no vitals filed for this visit.  Subjective Assessment - 04/16/18 1036    Subjective  pt statess he forgot her HEP last session.  Reports her pain is 5/10 today in her Lt knee. STates she got her knee drained and a cortisone injection in Lt knee on Monday.      Currently in Pain?  Yes    Pain Score  5     Pain Location  Knee    Pain  Orientation  Left    Pain Descriptors / Indicators  Aching                       [facility] Adult PT Treatment/Exercise - 04/16/18 0001      Knee/Hip Exercises: Standing   Heel Raises  Both;10 reps    Heel Raises Limitations  toeraises 10 reps    Hip Abduction  Both;10 reps    Hip Extension  Both;10 reps    SLS  bilaterally without UE max of 3 Rt: 12", Lt: 10"    Other Standing Knee Exercises  tandem gait 30" each       Knee/Hip Exercises: Supine   Quad Sets  Both;10 reps;Limitations    Quad Sets Limitations  5 second hold    Short Arc Quad Sets  Both;10 reps;Limitations    Short Arc Quad Sets Limitations  5 second hold    Bridges  Both;10 reps    Straight Leg Raises  Both;10 reps      Manual Therapy   Manual Therapy  Edema management    Manual therapy comments  completed seperately from all other interventions     Edema Management  retro massage Lt knee with elevation.             PT Education - 04/16/18 1350    Education Details  reveiwed goals, HEP and POC moving forward.  Given copy of HEP    Person(s) Educated  Patient    Methods  Explanation;Demonstration;Tactile cues;Verbal cues;Handout    Comprehension  Verbalized understanding;Returned demonstration;Tactile cues required;Verbal cues required       PT Short Term Goals - 04/16/18 1046      PT SHORT TERM GOAL #1   Title  Pt will have reduced edema by 2cm at 5cm suprapatellar in order to reduce her pain and further normalize ROM.    Time  2    Period  Weeks    Status  On-going    Target Date  04/25/18      PT SHORT TERM GOAL #2   Title  Pt will be able to perform 5xSTS in 12 sec or < with proper form and 5/10 L knee pain or < to demo improved functional strength and balance.     Time  2    Period  Weeks    Status  On-going      PT SHORT TERM GOAL #3   Title  Pt will be able to perform bil SLS for 15sec or > in order to demo improved bil functional hip strength and maximize her stair  ambulation.    Time  2    Period  Weeks    Status  On-going        PT Long Term Goals - 04/16/18 1047      PT LONG TERM GOAL #1   Title  Pt will have improved MMT by 1/2 grade throughout all mm groups tested in order to reduce stress/strain on L knee joint and maximize her functional mobility.     Time  4    Period  Weeks    Status  On-going      PT LONG TERM GOAL #2   Title  Pt will be able to perform 3MWT with 5/10 L knee pain or < to demo improved functional strength and improved tolerance to WB.     Time  4    Period  Weeks    Status  On-going      PT LONG TERM GOAL #3   Title  Pt will be able to ambulate full flight of stairs with minimal feelings of unsteadiness and weakness to demo improved functional quad strength and maximize community access.     Time  4    Period  Weeks    Status  On-going            Plan - 04/16/18 1346    Clinical Impression Statement  pt returns today with less pain and edema due to removal of fluid and injection by MD.  suprapatellar measurement is down to 45.5 cm this session (was 46.9).  Reviewed HEP, goals and POC moving forward.  Pt had forgotten her HEP, however able to recall and complete in correct form.  Progressed on with WB exercises including hip strengthening and static balance actvities.  Massage initiated to further help reduce edema of Lt knee.  Pt reported overall improvement with reduced pain.  No additional exercises added to HEP and given original copy issues on evaluiation  day.  Rehab Potential  Fair    PT Frequency  2x / week    PT Duration  4 weeks    PT Treatment/Interventions  ADLs/Self Care Home Management;Aquatic Therapy;Cryotherapy;Electrical Stimulation;Traction;Ultrasound;DME Instruction;Gait training;Stair training;Functional mobility training;Therapeutic activities;Therapeutic exercise;Balance training;Neuromuscular re-education;Patient/family education;Orthotic Fit/Training;Manual techniques;Passive range of  motion;Dry needling;Scar mobilization;Taping    PT Next Visit Plan  Next session administer FOTO.  Progress strengthening and balance training as able within pain tolerance.  Attempt static tandem balance on airex as solid ground was not challenging for patient.     PT Home Exercise Plan  eval: supine HS stretch (hands behind thigh), quad set, SAQ    Consulted and Agree with Plan of Care  Patient       Patient will benefit from skilled therapeutic intervention in order to improve the following deficits and impairments:  Abnormal gait, Decreased activity tolerance, Decreased balance, Decreased endurance, Decreased strength, Difficulty walking, Hypomobility, Increased edema, Increased fascial restricitons, Increased muscle spasms, Impaired flexibility, Improper body mechanics, Pain  Visit Diagnosis: Chronic pain of left knee  Chronic pain of right knee  Difficulty in walking, not elsewhere classified  Other symptoms and signs involving the musculoskeletal system     Problem List Patient Active Problem List   Diagnosis Date Noted  . Pain of left heel 07/03/2017  . Pain in joint of right hip 05/17/2017  . Benign essential HTN 11/17/2015  . Gastro-esophageal reflux disease without esophagitis 10/12/2014  . Hypertriglyceridemia 10/12/2014  . Hypertension 05/21/2013  . Unspecified hypothyroidism 05/21/2013  . Unspecified vitamin D deficiency 05/21/2013   Teena Irani, PTA/CLT [phone]  Teena Irani 04/16/2018, 1:51 PM  [facility] [address] [place], [place], [address] Phone: [phone]   Fax:  [phone]  Name: Linda Woods MRN: [id number] Date of Birth: Mar 13, 1950

## 2018-04-18 ENCOUNTER — Ambulatory Visit (HOSPITAL_COMMUNITY): Payer: Managed Care, Other (non HMO)

## 2018-04-23 ENCOUNTER — Ambulatory Visit (HOSPITAL_COMMUNITY): Payer: Medicare Other

## 2018-04-25 ENCOUNTER — Ambulatory Visit (HOSPITAL_COMMUNITY): Payer: Medicare Other

## 2018-04-30 ENCOUNTER — Encounter (HOSPITAL_COMMUNITY): Payer: Self-pay

## 2018-04-30 ENCOUNTER — Other Ambulatory Visit: Payer: Self-pay

## 2018-04-30 ENCOUNTER — Ambulatory Visit (HOSPITAL_COMMUNITY): Payer: Medicare Other | Attending: Orthopedic Surgery

## 2018-04-30 DIAGNOSIS — G8929 Other chronic pain: Secondary | ICD-10-CM | POA: Diagnosis present

## 2018-04-30 DIAGNOSIS — M25561 Pain in right knee: Secondary | ICD-10-CM | POA: Insufficient documentation

## 2018-04-30 DIAGNOSIS — R29898 Other symptoms and signs involving the musculoskeletal system: Secondary | ICD-10-CM | POA: Insufficient documentation

## 2018-04-30 DIAGNOSIS — R262 Difficulty in walking, not elsewhere classified: Secondary | ICD-10-CM | POA: Insufficient documentation

## 2018-04-30 DIAGNOSIS — M25562 Pain in left knee: Secondary | ICD-10-CM | POA: Insufficient documentation

## 2018-04-30 NOTE — Therapy (Signed)
[place], [place], [address] Phone: [phone]   Fax:  [phone]  Physical Therapy Treatment  Patient Details  Name: Linda Woods MRN: [id number] Date of Birth: [birth year] Referring Provider (PT): Paralee Cancel, MD   Encounter Date: 04/30/2018  PT End of Session - 04/30/18 0958    Visit Number  3    Number of Visits  8    Date for PT Re-Evaluation  05/09/18    Authorization Type  [employer] (until end of Februrary 2020) and [place] Time Period  04/11/18 to 05/09/18    PT Start Time  0955   pt late   PT Stop Time  1027    PT Time Calculation (min)  32 min    Activity Tolerance  Patient limited by pain    Behavior During Therapy  [facility] for tasks assessed/performed       Past Medical History:  Diagnosis Date  . Bursitis   . Chronic reflux esophagitis   . Diverticulosis   . GERD (gastroesophageal reflux disease)   . History of kidney stones   . Hypertension   . Knee pain    right knee-seeing ortho  . Osteoarthritis   . Plantar fasciitis   . Pure hypercholesterolemia   . RLS (restless legs syndrome)   . Thyroid disease    hypothyroid    Past Surgical History:  Procedure Laterality Date  . APPENDECTOMY    . CARPAL TUNNEL RELEASE Right 2011   Dr. Amedeo Plenty  . COLPORRHAPHY     posterior  . HAND SURGERY Right 2016   Nerve surgery, Dr. Amedeo Plenty  . OVARIAN CYST REMOVAL    . RECTOCELE REPAIR  2011   w/TVH and sling  . TONSILLECTOMY    . TOTAL VAGINAL HYSTERECTOMY  10/18/2009   rectocele repair, sling  . TUBAL LIGATION Bilateral   . VARICOSE VEIN SURGERY      There were no vitals filed for this visit.  Subjective Assessment - 04/30/18 0957    Subjective  Pt stating that she is in increased pain this date in her L knee, about 8/10. She has not been doing her HEP.     Currently in Pain?  Yes    Pain Score  8     Pain Location  Knee    Pain Orientation  Left    Pain Descriptors / Indicators   Aching    Pain Type  Chronic pain    Pain Onset  More than a month ago    Pain Frequency  Intermittent    Aggravating Factors   WB, stairs, pickin gup something heavy    Pain Relieving Factors  rest    Effect of Pain on Daily Activities  increases         [facility] PT Assessment - 04/30/18 0001      Assessment   Medical Diagnosis  bil knee pain    Referring Provider (PT)  Paralee Cancel, MD    Onset Date/Surgical Date  --   L knee scope on 11/18/17; bil knee pain for years   Next MD Visit  going to try and get in in the next week to drain the fluid off L knee    Prior Therapy  none for knee pain      Observation/Other Assessments   Focus on Therapeutic Outcomes (FOTO)   57% limitation              [facility]  Adult PT Treatment/Exercise - 04/30/18 0001      Knee/Hip Exercises: Stretches   Active Hamstring Stretch  Both;2 reps;30 seconds    Active Hamstring Stretch Limitations  standing, 12" step    Knee: Self-Stretch to increase Flexion  Left;2 reps;30 seconds    Knee: Self-Stretch Limitations  standing, 12" step    Gastroc Stretch  Both;3 reps;30 seconds    Gastroc Stretch Limitations  slant board      Knee/Hip Exercises: Standing   Heel Raises  Both;20 reps    Heel Raises Limitations  heel and toe    Hip Abduction  Both;15 reps    Abduction Limitations  RTB      Manual Therapy   Manual Therapy  Edema management    Manual therapy comments  completed seperately from all other interventions     Edema Management  retro massage Lt knee with elevation.             PT Education - 04/30/18 0958    Education Details  exercise technique, continue HEP    Person(s) Educated  Patient    Methods  Demonstration;Explanation    Comprehension  Verbalized understanding;Returned demonstration       PT Short Term Goals - 04/16/18 1046      PT SHORT TERM GOAL #1   Title  Pt will have reduced edema by 2cm at 5cm suprapatellar in order to reduce her pain and further normalize  ROM.    Time  2    Period  Weeks    Status  On-going    Target Date  04/25/18      PT SHORT TERM GOAL #2   Title  Pt will be able to perform 5xSTS in 12 sec or < with proper form and 5/10 L knee pain or < to demo improved functional strength and balance.     Time  2    Period  Weeks    Status  On-going      PT SHORT TERM GOAL #3   Title  Pt will be able to perform bil SLS for 15sec or > in order to demo improved bil functional hip strength and maximize her stair ambulation.    Time  2    Period  Weeks    Status  On-going        PT Long Term Goals - 04/16/18 1047      PT LONG TERM GOAL #1   Title  Pt will have improved MMT by 1/2 grade throughout all mm groups tested in order to reduce stress/strain on L knee joint and maximize her functional mobility.     Time  4    Period  Weeks    Status  On-going      PT LONG TERM GOAL #2   Title  Pt will be able to perform 3MWT with 5/10 L knee pain or < to demo improved functional strength and improved tolerance to WB.     Time  4    Period  Weeks    Status  On-going      PT LONG TERM GOAL #3   Title  Pt will be able to ambulate full flight of stairs with minimal feelings of unsteadiness and weakness to demo improved functional quad strength and maximize community access.     Time  4    Period  Weeks    Status  On-going            Plan - 04/30/18  1027    Clinical Impression Statement  Session limited as pt arrived late for appointment. Administered FOTO this date and pt scored 57% limitation. Pt with increased knee pain this date so performed all therex within pain tolerance. Continued hip strengthening and added RTB in standing but unable to progress pt any further due to pain. Ended with manual for edema control to reduce pain and swelling. Pt reporting feeling better at EOS but continued with antalgic gait exiting clinic. Pt f/u with Dr. Alvan Dame this Friday, 05/02/18, to discuss future management of her L knee pain.     Rehab  Potential  Fair    PT Frequency  2x / week    PT Duration  4 weeks    PT Treatment/Interventions  ADLs/Self Care Home Management;Aquatic Therapy;Cryotherapy;Electrical Stimulation;Traction;Ultrasound;DME Instruction;Gait training;Stair training;Functional mobility training;Therapeutic activities;Therapeutic exercise;Balance training;Neuromuscular re-education;Patient/family education;Orthotic Fit/Training;Manual techniques;Passive range of motion;Dry needling;Scar mobilization;Taping    PT Next Visit Plan  f/u wiht MD visit; progress strengthening and balance training as able within pain tolerance.  Attempt static tandem balance on airex as solid ground was not challenging for patient.     PT Home Exercise Plan  eval: supine HS stretch (hands behind thigh), quad set, SAQ    Consulted and Agree with Plan of Care  Patient       Patient will benefit from skilled therapeutic intervention in order to improve the following deficits and impairments:  Abnormal gait, Decreased activity tolerance, Decreased balance, Decreased endurance, Decreased strength, Difficulty walking, Hypomobility, Increased edema, Increased fascial restricitons, Increased muscle spasms, Impaired flexibility, Improper body mechanics, Pain  Visit Diagnosis: Chronic pain of left knee  Chronic pain of right knee  Difficulty in walking, not elsewhere classified  Other symptoms and signs involving the musculoskeletal system     Problem List Patient Active Problem List   Diagnosis Date Noted  . Pain of left heel 07/03/2017  . Pain in joint of right hip 05/17/2017  . Benign essential HTN 11/17/2015  . Gastro-esophageal reflux disease without esophagitis 10/12/2014  . Hypertriglyceridemia 10/12/2014  . Hypertension 05/21/2013  . Unspecified hypothyroidism 05/21/2013  . Unspecified vitamin D deficiency 05/21/2013        Geraldine Solar PT, DPT   [place], [place], [address] Phone: [phone]   Fax:  [phone]  Name: Linda Woods MRN: [id number] Date of Birth: 1950/03/05

## 2018-05-02 ENCOUNTER — Ambulatory Visit (HOSPITAL_COMMUNITY): Payer: Medicare Other

## 2018-05-05 ENCOUNTER — Telehealth (HOSPITAL_COMMUNITY): Payer: Self-pay | Admitting: Family Medicine

## 2018-05-05 NOTE — Telephone Encounter (Signed)
05/05/18  I called and spoke to patient about changing appt time on 3/18 since Barb won't be here.  We confirmed the change.

## 2018-05-07 ENCOUNTER — Ambulatory Visit (HOSPITAL_COMMUNITY): Payer: Medicare Other | Admitting: Physical Therapy

## 2018-05-07 ENCOUNTER — Telehealth (HOSPITAL_COMMUNITY): Payer: Self-pay | Admitting: Family Medicine

## 2018-05-07 NOTE — Telephone Encounter (Signed)
05/07/18  PT LEFT A MESSAGE TO CANCEL BUT NO REASON WAS GIVEN

## 2018-05-08 ENCOUNTER — Telehealth (HOSPITAL_COMMUNITY): Payer: Self-pay | Admitting: Family Medicine

## 2018-05-08 NOTE — Telephone Encounter (Signed)
05/08/18  Pt called to say that she wanted to cx her last appt... what is needs she can do at home

## 2018-05-09 ENCOUNTER — Ambulatory Visit (HOSPITAL_COMMUNITY): Payer: Medicare Other | Admitting: Physical Therapy

## 2018-05-14 ENCOUNTER — Telehealth: Payer: Self-pay | Admitting: Obstetrics & Gynecology

## 2018-05-14 NOTE — Telephone Encounter (Signed)
Routing to Bruce for recommendations of psychiatrist for patient.

## 2018-05-14 NOTE — Telephone Encounter (Signed)
Patient is calling requesting a suggestion for a psychiatrist in [place].

## 2018-05-16 NOTE — Telephone Encounter (Signed)
Spoke with patient, advised as seen below per Dr. Sabra Heck. Patient verbalizes understanding and is thankful for return call.  Encounter closed.

## 2018-05-16 NOTE — Telephone Encounter (Signed)
Patient is returning call to Jill. °

## 2018-05-16 NOTE — Telephone Encounter (Signed)
Left message to call Dinh Ayotte, RN at [facility] [phone].   

## 2018-05-16 NOTE — Telephone Encounter (Signed)
Pearson Grippe, MD at [facility].  [place], [address].  [phone].  If Dr. Oretha Ellis appts are far out, she could start with one of the therapist/counselors to get in more quickly.   Also, Dr. Charlcie Cradle at [facility].  [address], [place]  [phone].  they are doing virtual visits as well.

## 2018-06-03 ENCOUNTER — Encounter (HOSPITAL_COMMUNITY): Payer: Self-pay

## 2018-06-03 NOTE — Therapy (Signed)
[place], [place], [address] Phone: [phone]   Fax:  [phone]  Patient Details  Name: CABELA PACIFICO MRN: [id number] Date of Birth: 1950/12/22 Referring Provider:  No ref. provider found  Encounter Date: 06/03/2018  PHYSICAL THERAPY DISCHARGE SUMMARY  Visits from Start of Care: 3  Current functional level related to goals / functional outcomes: See last treatment note   Remaining deficits: See last treatment note   Education / Equipment: See last treatment note  Plan: Patient agrees to discharge.  Patient goals were not met. Patient is being discharged due to not returning since the last visit.  ?????     Geraldine Solar PT, [place] [address] [place], [place], [address] Phone: [phone]   Fax:  [phone]

## 2018-09-01 ENCOUNTER — Ambulatory Visit (HOSPITAL_COMMUNITY): Payer: Medicare Other

## 2018-09-03 ENCOUNTER — Encounter (HOSPITAL_COMMUNITY): Payer: Medicare Other

## 2018-09-04 ENCOUNTER — Other Ambulatory Visit (HOSPITAL_COMMUNITY): Payer: Self-pay | Admitting: *Deleted

## 2018-09-04 NOTE — Progress Notes (Signed)
MEDICAL CLEARANCE NOTE DR Karie Kirks 09-02-2018 ON CHART LOV 07-29-18 DR [place] ON CHART HEMAGLOBIN A1C 08-13-18 DR [place] ON CHART

## 2018-09-04 NOTE — Patient Instructions (Addendum)
YOU NEED TO HAVE A COVID 19 TEST ON  09-05-2018 AT 200 PM. THIS TEST MUST BE DONE BEFORE SURGERY, COME TO [place] ENTRANCE. ONCE YOUR COVID TEST IS COMPLETED, PLEASE BEGIN THE QUARANTINE INSTRUCTIONS AS OUTLINED IN YOUR HANDOUT.                Linda Woods    Your procedure is scheduled on: 09-09-2018   Report to [facility] Main  Entrance    Report to admitting at 7:35 AM    Call this number if you have problems the morning of surgery [phone]    Remember:  NO SOLID FOOD AFTER MIDNIGHT THE NIGHT PRIOR TO SURGERY. NOTHING BY MOUTH EXCEPT CLEAR LIQUIDS UNTIL 705 AM. PLEASE FINISH ENSURE DRINK PER SURGEON ORDER 3 HOURS PRIOR TO SCHEDULED SURGERY TIME WHICH NEEDS TO BE COMPLETED AT 7:05 AM.   CLEAR LIQUID DIET   Foods Allowed                                                                     Foods Excluded  Coffee and tea, regular and decaf                             liquids that you cannot  Plain Jell-O any favor except red or purple                                           see through such as: Fruit ices (not with fruit pulp)                                     milk, soups, orange juice  Iced Popsicles                                    All solid food Carbonated beverages, regular and diet                                    Cranberry, grape and apple juices Sports drinks like Gatorade Lightly seasoned clear broth or consume(fat free) Sugar, honey syrup  Sample Menu Breakfast                                Lunch                                     Supper Cranberry juice                    Beef broth                            Chicken broth Jell-O  Grape juice                           Apple juice Coffee or tea                        Jell-O                                      Popsicle                                                Coffee or tea                        Coffee or  tea  _____________________________________________________________________     Take these medicines the morning of surgery with A SIP OF WATER: Famotidine (Pepcid), Lorazepam (Ativan), Metoprolol Succinate (Toprol-XL), and Synthroid        BRUSH YOUR TEETH MORNING OF SURGERY AND RINSE YOUR MOUTH OUT, NO CHEWING GUM CANDY OR MINTS.                         You may not have any metal on your body including hair pins and              piercings     Do not wear jewelry, make-up, lotions, powders or perfumes, deodorant              Do not wear nail polish.  Do not shave  48 hours prior to surgery.               Do not bring valuables to the hospital. [place].  Contacts, dentures or bridgework may not be worn into surgery.       _____________________________________________________________________             [facility] - Preparing for Surgery Before surgery, you can play an important role.  Because skin is not sterile, your skin needs to be as free of germs as possible.  You can reduce the number of germs on your skin by washing with CHG (chlorahexidine gluconate) soap before surgery.  CHG is an antiseptic cleaner which kills germs and bonds with the skin to continue killing germs even after washing. Please DO NOT use if you have an allergy to CHG or antibacterial soaps.  If your skin becomes reddened/irritated stop using the CHG and inform your nurse when you arrive at Short Stay. Do not shave (including legs and underarms) for at least 48 hours prior to the first CHG shower.  You may shave your face/neck. Please follow these instructions carefully:  1.  Shower with CHG Soap the night before surgery and the  morning of Surgery.  2.  If you choose to wash your hair, wash your hair first as usual with your  normal  shampoo.  3.  After you shampoo, rinse your hair and body thoroughly to remove the  shampoo.  4.   Use CHG as you would any other liquid soap.  You can apply chg directly  to the skin and wash                       Gently with a scrungie or clean washcloth.  5.  Apply the CHG Soap to your body ONLY FROM THE NECK DOWN.   Do not use on face/ open                           Wound or open sores. Avoid contact with eyes, ears mouth and genitals (private parts).                       Wash face,  Genitals (private parts) with your normal soap.             6.  Wash thoroughly, paying special attention to the area where your surgery  will be performed.  7.  Thoroughly rinse your body with warm water from the neck down.  8.  DO NOT shower/wash with your normal soap after using and rinsing off  the CHG Soap.                9.  Pat yourself dry with a clean towel.            10.  Wear clean pajamas.            11.  Place clean sheets on your bed the night of your first shower and do not  sleep with pets. Day of Surgery : Do not apply any lotions/deodorants the morning of surgery.  Please wear clean clothes to the hospital/surgery center.  FAILURE TO FOLLOW THESE INSTRUCTIONS MAY RESULT IN THE CANCELLATION OF YOUR SURGERY PATIENT SIGNATURE_________________________________  NURSE SIGNATURE__________________________________  ________________________________________________________________________   Linda Woods  An incentive spirometer is a tool that can help keep your lungs clear and active. This tool measures how well you are filling your lungs with each breath. Taking long deep breaths may help reverse or decrease the chance of developing breathing (pulmonary) problems (especially infection) following:  A long period of time when you are unable to move or be active. BEFORE THE PROCEDURE   If the spirometer includes an indicator to show your best effort, your nurse or respiratory therapist will set it to a desired goal.  If possible, sit up straight or lean slightly forward. Try not to  slouch.  Hold the incentive spirometer in an upright position. INSTRUCTIONS FOR USE  1. Sit on the edge of your bed if possible, or sit up as far as you can in bed or on a chair. 2. Hold the incentive spirometer in an upright position. 3. Breathe out normally. 4. Place the mouthpiece in your mouth and seal your lips tightly around it. 5. Breathe in slowly and as deeply as possible, raising the piston or the ball toward the top of the column. 6. Hold your breath for 3-5 seconds or for as long as possible. Allow the piston or ball to fall to the bottom of the column. 7. Remove the mouthpiece from your mouth and breathe out normally. 8. Rest for a few seconds and repeat Steps 1 through 7 at least 10 times every 1-2 hours when you are awake. Take your time and take a few normal breaths between deep breaths. 9. The spirometer may include an indicator to  show your best effort. Use the indicator as a goal to work toward during each repetition. 10. After each set of 10 deep breaths, practice coughing to be sure your lungs are clear. If you have an incision (the cut made at the time of surgery), support your incision when coughing by placing a pillow or rolled up towels firmly against it. Once you are able to get out of bed, walk around indoors and cough well. You may stop using the incentive spirometer when instructed by your caregiver.  RISKS AND COMPLICATIONS  Take your time so you do not get dizzy or light-headed.  If you are in pain, you may need to take or ask for pain medication before doing incentive spirometry. It is harder to take a deep breath if you are having pain. AFTER USE  Rest and breathe slowly and easily.  It can be helpful to keep track of a log of your progress. Your caregiver can provide you with a simple table to help with this. If you are using the spirometer at home, follow these instructions: [place] IF:   You are having difficultly using the spirometer.  You  have trouble using the spirometer as often as instructed.  Your pain medication is not giving enough relief while using the spirometer.  You develop fever of 100.5 F (38.1 C) or higher. SEEK IMMEDIATE MEDICAL CARE IF:   You cough up bloody sputum that had not been present before.  You develop fever of 102 F (38.9 C) or greater.  You develop worsening pain at or near the incision site. MAKE SURE YOU:   Understand these instructions.  Will watch your condition.  Will get help right away if you are not doing well or get worse. Document Released: 06/18/2006 Document Revised: 04/30/2011 Document Reviewed: 08/19/2006 Exit[employer] Patient Information 2014 Exit[employer], [place].   ________________________________________________________________________  WHAT IS A BLOOD TRANSFUSION? Blood Transfusion Information  A transfusion is the replacement of blood or some of its parts. Blood is made up of multiple cells which provide different functions.  Red blood cells carry oxygen and are used for blood loss replacement.  White blood cells fight against infection.  Platelets control bleeding.  Plasma helps clot blood.  Other blood products are available for specialized needs, such as hemophilia or other clotting disorders. BEFORE THE TRANSFUSION  Who gives blood for transfusions?   Healthy volunteers who are fully evaluated to make sure their blood is safe. This is blood bank blood. Transfusion therapy is the safest it has ever been in the practice of medicine. Before blood is taken from a donor, a complete history is taken to make sure that person has no history of diseases nor engages in risky social behavior (examples are intravenous drug use or sexual activity with multiple partners). The donor's travel history is screened to minimize risk of transmitting infections, such as malaria. The donated blood is tested for signs of infectious diseases, such as HIV and hepatitis. The blood is then  tested to be sure it is compatible with you in order to minimize the chance of a transfusion reaction. If you or a relative donates blood, this is often done in anticipation of surgery and is not appropriate for emergency situations. It takes many days to process the donated blood. RISKS AND COMPLICATIONS Although transfusion therapy is very safe and saves many lives, the main dangers of transfusion include:   Getting an infectious disease.  Developing a transfusion reaction. This is an allergic reaction  to something in the blood you were given. Every precaution is taken to prevent this. The decision to have a blood transfusion has been considered carefully by your caregiver before blood is given. Blood is not given unless the benefits outweigh the risks. AFTER THE TRANSFUSION  Right after receiving a blood transfusion, you will usually feel much better and more energetic. This is especially true if your red blood cells have gotten low (anemic). The transfusion raises the level of the red blood cells which carry oxygen, and this usually causes an energy increase.  The nurse administering the transfusion will monitor you carefully for complications. HOME CARE INSTRUCTIONS  No special instructions are needed after a transfusion. You may find your energy is better. Speak with your caregiver about any limitations on activity for underlying diseases you may have. SEEK MEDICAL CARE IF:   Your condition is not improving after your transfusion.  You develop redness or irritation at the intravenous (IV) site. SEEK IMMEDIATE MEDICAL CARE IF:  Any of the following symptoms occur over the next 12 hours:  Shaking chills.  You have a temperature by mouth above 102 F (38.9 C), not controlled by medicine.  Chest, back, or muscle pain.  People around you feel you are not acting correctly or are confused.  Shortness of breath or difficulty breathing.  Dizziness and fainting.  You get a rash or  develop hives.  You have a decrease in urine output.  Your urine turns a dark color or changes to pink, red, or brown. Any of the following symptoms occur over the next 10 days:  You have a temperature by mouth above 102 F (38.9 C), not controlled by medicine.  Shortness of breath.  Weakness after normal activity.  The white part of the eye turns yellow (jaundice).  You have a decrease in the amount of urine or are urinating less often.  Your urine turns a dark color or changes to pink, red, or brown. Document Released: 02/03/2000 Document Revised: 04/30/2011 Document Reviewed: 09/22/2007 [facility] Patient Information 2014 [place], [place].  _______________________________________________________________________

## 2018-09-05 ENCOUNTER — Encounter (HOSPITAL_COMMUNITY)
Admission: RE | Admit: 2018-09-05 | Discharge: 2018-09-05 | Disposition: A | Discharge status: Home / Self Care | Payer: Medicare Other | Source: Ambulatory Visit | Attending: Orthopedic Surgery | Admitting: Orthopedic Surgery

## 2018-09-05 ENCOUNTER — Other Ambulatory Visit (HOSPITAL_COMMUNITY)
Admission: RE | Admit: 2018-09-05 | Discharge: 2018-09-05 | Disposition: A | Discharge status: Home / Self Care | Payer: Medicare Other | Source: Ambulatory Visit | Attending: Orthopedic Surgery | Admitting: Orthopedic Surgery

## 2018-09-05 ENCOUNTER — Other Ambulatory Visit: Payer: Self-pay

## 2018-09-05 ENCOUNTER — Encounter (HOSPITAL_COMMUNITY): Payer: Self-pay

## 2018-09-05 DIAGNOSIS — Z1159 Encounter for screening for other viral diseases: Secondary | ICD-10-CM | POA: Diagnosis not present

## 2018-09-05 DIAGNOSIS — M1712 Unilateral primary osteoarthritis, left knee: Secondary | ICD-10-CM | POA: Insufficient documentation

## 2018-09-05 DIAGNOSIS — Z01818 Encounter for other preprocedural examination: Secondary | ICD-10-CM | POA: Insufficient documentation

## 2018-09-05 DIAGNOSIS — Z79899 Other long term (current) drug therapy: Secondary | ICD-10-CM | POA: Insufficient documentation

## 2018-09-05 DIAGNOSIS — R9431 Abnormal electrocardiogram [ECG] [EKG]: Secondary | ICD-10-CM | POA: Insufficient documentation

## 2018-09-05 DIAGNOSIS — E039 Hypothyroidism, unspecified: Secondary | ICD-10-CM | POA: Diagnosis not present

## 2018-09-05 DIAGNOSIS — I10 Essential (primary) hypertension: Secondary | ICD-10-CM | POA: Insufficient documentation

## 2018-09-05 DIAGNOSIS — Z791 Long term (current) use of non-steroidal anti-inflammatories (NSAID): Secondary | ICD-10-CM | POA: Insufficient documentation

## 2018-09-05 DIAGNOSIS — K219 Gastro-esophageal reflux disease without esophagitis: Secondary | ICD-10-CM | POA: Insufficient documentation

## 2018-09-05 DIAGNOSIS — E7800 Pure hypercholesterolemia, unspecified: Secondary | ICD-10-CM | POA: Diagnosis not present

## 2018-09-05 HISTORY — DX: Other complications of anesthesia, initial encounter: T88.59XA

## 2018-09-05 LAB — BASIC METABOLIC PANEL
Anion gap: 11 (ref 5–15)
BUN: 12 mg/dL (ref 8–23)
CO2: 24 mmol/L (ref 22–32)
Calcium: 9.5 mg/dL (ref 8.9–10.3)
Chloride: 106 mmol/L (ref 98–111)
Creatinine, Ser: 0.69 mg/dL (ref 0.44–1.00)
GFR calc Af Amer: >60 mL/min (ref >60)
GFR calc non Af Amer: >60 mL/min (ref >60)
Glucose, Bld: 121 mg/dL — ABNORMAL HIGH (ref 70–99)
Potassium: 4.2 mmol/L (ref 3.5–5.1)
Sodium: 141 mmol/L (ref 135–145)

## 2018-09-05 LAB — CBC
HCT: 42.3 % (ref 36.0–46.0)
Hemoglobin: 13.7 g/dL (ref 12.0–15.0)
MCH: 30.6 pg (ref 26.0–34.0)
MCHC: 32.4 g/dL (ref 30.0–36.0)
MCV: 94.6 fL (ref 80.0–100.0)
Platelets: 235 10*3/uL (ref 150–400)
RBC: 4.47 MIL/uL (ref 3.87–5.11)
RDW: 12.3 % (ref 11.5–15.5)
WBC: 6.5 10*3/uL (ref 4.0–10.5)
nRBC: 0 % (ref 0.0–0.2)

## 2018-09-05 LAB — SURGICAL PCR SCREEN
MRSA, PCR: NEGATIVE
Staphylococcus aureus: NEGATIVE

## 2018-09-05 LAB — SARS CORONAVIRUS 2 (TAT 6-24 HRS): SARS Coronavirus 2: NEGATIVE

## 2018-09-05 NOTE — Anesthesia Preprocedure Evaluation (Addendum)
Anesthesia Evaluation  Patient identified by MRN, date of birth, ID band Patient awake    Reviewed: Allergy & Precautions, NPO status , Patient's Chart, lab work & pertinent test results  Airway Mallampati: II  TM Distance: >3 FB Neck ROM: Full    Dental no notable dental hx.    Pulmonary neg pulmonary ROS,    Pulmonary exam normal breath sounds clear to auscultation       Cardiovascular hypertension, Pt. on medications and Pt. on home beta blockers negative cardio ROS Normal cardiovascular exam Rhythm:Regular Rate:Normal     Neuro/Psych negative neurological ROS  negative psych ROS   GI/Hepatic Neg liver ROS, GERD  Medicated,  Endo/Other  Hypothyroidism   Renal/GU negative Renal ROS  negative genitourinary   Musculoskeletal  (+) Arthritis , Osteoarthritis,    Abdominal   Peds negative pediatric ROS (+)  Hematology negative hematology ROS (+)   Anesthesia Other Findings   Reproductive/Obstetrics negative OB ROS                            Anesthesia Physical Anesthesia Plan  ASA: II  Anesthesia Plan: Spinal   Post-op Pain Management:  Regional for Post-op pain   Induction: Intravenous  PONV Risk Score and Plan: 2 and Ondansetron, Dexamethasone and Treatment may vary due to age or medical condition  Airway Management Planned: Simple Face Mask  Additional Equipment:   Intra-op Plan:   Post-operative Plan:   Informed Consent: I have reviewed the patients History and Physical, chart, labs and discussed the procedure including the risks, benefits and alternatives for the proposed anesthesia with the patient or authorized representative who has indicated his/her understanding and acceptance.     Dental advisory given  Plan Discussed with: CRNA and Surgeon  Anesthesia Plan Comments: (See APP note by Willeen Cass, FNP)       Anesthesia Quick Evaluation

## 2018-09-05 NOTE — Progress Notes (Signed)
Anesthesia Chart Review:   Case: [id number] Date/Time: 09/09/18 0950   Procedure: TOTAL KNEE ARTHROPLASTY (Left ) - 70 mins   Anesthesia type: Spinal   Pre-op diagnosis: Left knee osteoarthritis   Location: [facility] ROOM 09 / [facility]   Surgeon: Paralee Cancel, MD      DISCUSSION:  Pt is a 68 year old female with hx HTN.   - Pt denies acute CV symptoms.   - Reviewed EKG with Dr. Lissa Hoard.    VS: BP 129/82   Pulse 88   Temp 37 C (Oral)   Resp 16   Ht 5\' 6"  (1.676 m)   Wt 79.6 kg   LMP 08/21/2002   SpO2 97%   BMI 28.33 kg/m    PROVIDERS: - PCP is [name], MD. Who has cleared pt for surgery   LABS: Labs reviewed: Acceptable for surgery. (all labs ordered are listed, but only abnormal results are displayed)  Labs Reviewed  BASIC METABOLIC PANEL - Abnormal; Notable for the following components:      Result Value   Glucose, Bld 121 (*)    All other components within normal limits  SURGICAL PCR SCREEN  CBC  TYPE AND SCREEN    EKG 09/05/18: NSR. ST & T wave abnormality, consider anterior ischemia. Appears stable when compared to prior EKG 07/22/14 (on paper chart)    Past Medical History:  Diagnosis Date  . Bursitis   . Chronic reflux esophagitis   . Complication of anesthesia   . Diverticulosis   . GERD (gastroesophageal reflux disease)   . History of kidney stones   . Hypertension   . Knee pain    right knee-seeing ortho  . Osteoarthritis   . Plantar fasciitis   . Pure hypercholesterolemia   . RLS (restless legs syndrome)   . Thyroid disease    hypothyroid    Past Surgical History:  Procedure Laterality Date  . APPENDECTOMY    . CARPAL TUNNEL RELEASE Right 2011   Dr. Amedeo Plenty  . COLPORRHAPHY     posterior  . HAND SURGERY Right 2016   Nerve surgery, Dr. Amedeo Plenty  . OVARIAN CYST REMOVAL    . RECTOCELE REPAIR  2011   w/TVH and sling  . TONSILLECTOMY    . TONSILLECTOMY    . TOTAL VAGINAL HYSTERECTOMY  10/18/2009   rectocele repair, sling  . TUBAL  LIGATION Bilateral   . VARICOSE VEIN SURGERY      MEDICATIONS: . celecoxib (CELEBREX) 200 MG capsule  . famotidine (PEPCID) 40 MG tablet  . LORazepam (ATIVAN) 0.5 MG tablet  . lovastatin (MEVACOR) 20 MG tablet  . metoprolol succinate (TOPROL XL) 50 MG 24 hr tablet  . NEOMYCIN-POLYMYXIN-HYDROCORTISONE (CORTISPORIN) 1 % SOLN OTIC solution  . RABEprazole (ACIPHEX) 20 MG tablet  . SYNTHROID 112 MCG tablet   No current facility-administered medications for this encounter.     If no changes, I anticipate pt can proceed with surgery as scheduled.   Willeen Cass, FNP-BC [facility]/Anesthesiology Phone: [phone] 09/05/2018 2:46 PM

## 2018-09-08 NOTE — H&P (Signed)
TOTAL KNEE ADMISSION H&P  Patient is being admitted for left total knee arthroplasty and right knee injection  Subjective:  Chief Complaint:   Bilateral knee pain and left knee primary OA  HPI: Linda Woods, 68 y.o. female, has a history of pain and functional disability in the bilaterally knee due to arthritis and has failed non-surgical conservative treatments for greater than 12 weeks to include NSAID's and/or analgesics, corticosteriod injections, viscosupplementation injections and activity modification.  Onset of symptoms was gradual, starting 3+ years ago with gradually worsening course since that time. The patient noted prior procedures on the knee to include  arthroscopy on the left knee(s).  Patient currently rates pain in the bilateral knee(s) at 10 out of 10 with activity. Patient has worsening of pain with activity and weight bearing, pain that interferes with activities of daily living, pain with passive range of motion, crepitus and joint swelling.  Patient has evidence of periarticular osteophytes and joint space narrowing by imaging studies.  There is no active infection.  Risks, benefits and expectations were discussed with the patient.  Risks including but not limited to the risk of anesthesia, blood clots, nerve damage, blood vessel damage, failure of the prosthesis, infection and up to and including death.  Patient understand the risks, benefits and expectations and wishes to proceed with surgery.  PCP: [name], MD  D/C Plans:       Home   Post-op Meds:       No Rx given   Tranexamic Acid:      To be given - IV   Decadron:      Is to be given  FYI:      ASA  Norco  CPM  DME:   Pt already has equipment  PT:   [place]: Gwyneth Sprout    Patient Active Problem List   Diagnosis Date Noted  . Pain of left heel 07/03/2017  . Pain in joint of right hip 05/17/2017  . Benign essential HTN 11/17/2015  . Gastro-esophageal reflux disease without  esophagitis 10/12/2014  . Hypertriglyceridemia 10/12/2014  . Hypertension 05/21/2013  . Unspecified hypothyroidism 05/21/2013  . Unspecified vitamin D deficiency 05/21/2013   Past Medical History:  Diagnosis Date  . Bursitis   . Chronic reflux esophagitis   . Complication of anesthesia   . Diverticulosis   . GERD (gastroesophageal reflux disease)   . History of kidney stones   . Hypertension   . Knee pain    right knee-seeing ortho  . Osteoarthritis   . Plantar fasciitis   . Pure hypercholesterolemia   . RLS (restless legs syndrome)   . Thyroid disease    hypothyroid    Past Surgical History:  Procedure Laterality Date  . APPENDECTOMY    . CARPAL TUNNEL RELEASE Right 2011   Dr. Amedeo Plenty  . COLPORRHAPHY     posterior  . HAND SURGERY Right 2016   Nerve surgery, Dr. Amedeo Plenty  . OVARIAN CYST REMOVAL    . RECTOCELE REPAIR  2011   w/TVH and sling  . TONSILLECTOMY    . TONSILLECTOMY    . TOTAL VAGINAL HYSTERECTOMY  10/18/2009   rectocele repair, sling  . TUBAL LIGATION Bilateral   . VARICOSE VEIN SURGERY      No current facility-administered medications for this encounter.    Current Outpatient Medications  Medication Sig Dispense Refill Last Dose  . famotidine (PEPCID) 40 MG tablet Take 40 mg by mouth 3 (three) times a week.      Marland Kitchen  LORazepam (ATIVAN) 0.5 MG tablet Take 0.5 mg by mouth.     . lovastatin (MEVACOR) 20 MG tablet Take 20 mg by mouth at bedtime.      . metoprolol succinate (TOPROL XL) 50 MG 24 hr tablet Take 50 mg by mouth.     . RABEprazole (ACIPHEX) 20 MG tablet Take 1 tablet (20 mg total) by mouth 2 (two) times a week.     Marland Kitchen SYNTHROID 112 MCG tablet Take 112 mcg by mouth daily before breakfast.      . celecoxib (CELEBREX) 200 MG capsule Take 1 capsule (200 mg total) by mouth daily. (Patient not taking: Reported on 08/29/2018) 30 capsule 2 Not Taking at Unknown time  . NEOMYCIN-POLYMYXIN-HYDROCORTISONE (CORTISPORIN) 1 % SOLN OTIC solution Apply 1-2 drops to toe  BID after soaking (Patient not taking: Reported on 08/29/2018) 10 mL 1 Not Taking at Unknown time   Allergies  Allergen Reactions  . Statins     Joint pain    Social History   Tobacco Use  . Smoking status: Never Smoker  . Smokeless tobacco: Never Used  Substance Use Topics  . Alcohol use: Yes    Alcohol/week: 0.0 standard drinks    Comment: wine    Family History  Problem Relation Age of Onset  . Cervical cancer Mother   . Heart failure Mother   . Heart failure Father   . Diabetes Father   . Cancer Brother   . Breast cancer Maternal Aunt   . Breast cancer Paternal Aunt      Review of Systems  Constitutional: Negative.   HENT: Negative.   Eyes: Negative.   Respiratory: Negative.   Cardiovascular: Negative.   Gastrointestinal: Positive for heartburn.  Genitourinary: Negative.   Musculoskeletal: Positive for joint pain.  Skin: Negative.   Neurological: Negative.   Endo/Heme/Allergies: Negative.   Psychiatric/Behavioral: Negative.     Objective:  Physical Exam  Constitutional: She is oriented to person, place, and time. She appears well-developed.  HENT:  Head: Normocephalic.  Eyes: Pupils are equal, round, and reactive to light.  Neck: Neck supple. No JVD present. No tracheal deviation present. No thyromegaly present.  Cardiovascular: Normal rate, regular rhythm and intact distal pulses.  Respiratory: Effort normal and breath sounds normal. No respiratory distress. She has no wheezes.  GI: Soft. There is no abdominal tenderness. There is no guarding.  Musculoskeletal:     Right knee: She exhibits bony tenderness. She exhibits no swelling, no ecchymosis, no deformity and no laceration. Tenderness found.     Left knee: She exhibits swelling and bony tenderness. She exhibits no ecchymosis, no deformity, no laceration and no erythema. Tenderness found.  Lymphadenopathy:    She has no cervical adenopathy.  Neurological: She is alert and oriented to person, place,  and time.  Skin: Skin is warm and dry.  Psychiatric: She has a normal mood and affect.     Labs:  Estimated body mass index is 28.33 kg/m as calculated from the following:   Height as of 09/05/18: 5\' 6"  (1.676 m).   Weight as of 09/05/18: 79.6 kg.   Imaging Review Plain radiographs demonstrate severe degenerative joint disease of the left knee(s).  The bone quality appears to be good for age and reported activity level.      Assessment/Plan:  End stage arthritis, bilateral knees  The patient history, physical examination, clinical judgment of the provider and imaging studies are consistent with end stage degenerative joint disease of the left knee(s) and  total knee arthroplasty is deemed medically necessary. The treatment options including medical management, injection therapy arthroscopy and arthroplasty were discussed at length. The risks and benefits of total knee arthroplasty were presented and reviewed. The risks due to aseptic loosening, infection, stiffness, patella tracking problems, thromboembolic complications and other imponderables were discussed. The patient acknowledged the explanation, agreed to proceed with the plan and consent was signed. Patient is being admitted for inpatient treatment for surgery, pain control, PT, OT, prophylactic antibiotics, VTE prophylaxis, progressive ambulation and ADL's and discharge planning. The patient is planning to be discharged home.     Patient's anticipated LOS is less than 2 midnights, meeting these requirements: - Lives within 1 hour of care - Has a competent adult at home to recover with post-op recover - NO history of  - Chronic pain requiring opiods  - Diabetes  - Coronary Artery Disease  - Heart failure  - Heart attack  - Stroke  - DVT/VTE  - Cardiac arrhythmia  - Respiratory Failure/COPD  - Renal failure  - Anemia  - Advanced Liver disease     [place] Pugh. Marquisa Salih   PA-C  09/08/2018, 9:11 PM

## 2018-09-09 ENCOUNTER — Inpatient Hospital Stay (HOSPITAL_COMMUNITY): Payer: Medicare Other | Admitting: Physician Assistant

## 2018-09-09 ENCOUNTER — Inpatient Hospital Stay (HOSPITAL_COMMUNITY)
Admission: RE | Admit: 2018-09-09 | Discharge: 2018-09-10 | DRG: 470 | Disposition: A | Discharge status: Home / Self Care | Payer: Medicare Other | Source: Other Acute Inpatient Hospital | Attending: Orthopedic Surgery | Admitting: Orthopedic Surgery

## 2018-09-09 ENCOUNTER — Other Ambulatory Visit: Payer: Self-pay

## 2018-09-09 ENCOUNTER — Encounter (HOSPITAL_COMMUNITY): Payer: Self-pay | Admitting: Emergency Medicine

## 2018-09-09 ENCOUNTER — Encounter (HOSPITAL_COMMUNITY)
Admission: RE | Disposition: A | Discharge status: Home / Self Care | Payer: Self-pay | Source: Other Acute Inpatient Hospital | Attending: Orthopedic Surgery

## 2018-09-09 DIAGNOSIS — I10 Essential (primary) hypertension: Secondary | ICD-10-CM | POA: Diagnosis present

## 2018-09-09 DIAGNOSIS — Z96652 Presence of left artificial knee joint: Secondary | ICD-10-CM

## 2018-09-09 DIAGNOSIS — E7800 Pure hypercholesterolemia, unspecified: Secondary | ICD-10-CM | POA: Diagnosis present

## 2018-09-09 DIAGNOSIS — E039 Hypothyroidism, unspecified: Secondary | ICD-10-CM | POA: Diagnosis present

## 2018-09-09 DIAGNOSIS — Z79890 Hormone replacement therapy (postmenopausal): Secondary | ICD-10-CM | POA: Diagnosis not present

## 2018-09-09 DIAGNOSIS — Z79899 Other long term (current) drug therapy: Secondary | ICD-10-CM

## 2018-09-09 DIAGNOSIS — G2581 Restless legs syndrome: Secondary | ICD-10-CM | POA: Diagnosis present

## 2018-09-09 DIAGNOSIS — Z90710 Acquired absence of both cervix and uterus: Secondary | ICD-10-CM

## 2018-09-09 DIAGNOSIS — K210 Gastro-esophageal reflux disease with esophagitis: Secondary | ICD-10-CM | POA: Diagnosis present

## 2018-09-09 DIAGNOSIS — M170 Bilateral primary osteoarthritis of knee: Principal | ICD-10-CM | POA: Diagnosis present

## 2018-09-09 HISTORY — PX: TOTAL KNEE ARTHROPLASTY: SHX125

## 2018-09-09 LAB — TYPE AND SCREEN
ABO/RH(D): A POS
Antibody Screen: NEGATIVE

## 2018-09-09 SURGERY — ARTHROPLASTY, KNEE, TOTAL
Anesthesia: Spinal | Site: Knee | Laterality: Left

## 2018-09-09 MED ORDER — POLYETHYLENE GLYCOL 3350 17 G PO PACK
17.0000 g | PACK | Freq: Two times a day (BID) | ORAL | Status: DC
  Administered 2018-09-10: 17 g via ORAL
  Filled 2018-09-09: qty 1

## 2018-09-09 MED ORDER — MAGNESIUM CITRATE PO SOLN: 1.0000 | Freq: Once | ORAL | Status: DC | PRN

## 2018-09-09 MED ORDER — PANTOPRAZOLE SODIUM 40 MG PO TBEC: 40.0000 mg | DELAYED_RELEASE_TABLET | Freq: Every day | ORAL | Status: DC | PRN

## 2018-09-09 MED ORDER — DEXAMETHASONE SODIUM PHOSPHATE 10 MG/ML IJ SOLN
10.0000 mg | Freq: Once | INTRAMUSCULAR | Status: AC
  Administered 2018-09-09: 10 mg via INTRAVENOUS

## 2018-09-09 MED ORDER — DEXAMETHASONE SODIUM PHOSPHATE 10 MG/ML IJ SOLN
INTRAMUSCULAR | Status: AC
  Filled 2018-09-09: qty 1

## 2018-09-09 MED ORDER — PROPOFOL 500 MG/50ML IV EMUL
INTRAVENOUS | Status: DC | PRN
  Administered 2018-09-09: 100 ug/kg/min via INTRAVENOUS

## 2018-09-09 MED ORDER — KETOROLAC TROMETHAMINE 30 MG/ML IJ SOLN
INTRAMUSCULAR | Status: AC
  Filled 2018-09-09: qty 1

## 2018-09-09 MED ORDER — MENTHOL 3 MG MT LOZG: 1.0000 | LOZENGE | OROMUCOSAL | Status: DC | PRN

## 2018-09-09 MED ORDER — PROMETHAZINE HCL 25 MG/ML IJ SOLN: 6.2500 mg | INTRAMUSCULAR | Status: DC | PRN

## 2018-09-09 MED ORDER — FENTANYL CITRATE (PF) 100 MCG/2ML IJ SOLN
50.0000 ug | INTRAMUSCULAR | Status: DC
  Administered 2018-09-09: 50 ug via INTRAVENOUS
  Filled 2018-09-09: qty 2

## 2018-09-09 MED ORDER — LACTATED RINGERS IV SOLN
INTRAVENOUS | Status: DC
  Administered 2018-09-09 (×2): via INTRAVENOUS

## 2018-09-09 MED ORDER — DIPHENHYDRAMINE HCL 12.5 MG/5ML PO ELIX: 12.5000 mg | ORAL_SOLUTION | ORAL | Status: DC | PRN

## 2018-09-09 MED ORDER — SODIUM CHLORIDE 0.9 % IV SOLN
INTRAVENOUS | Status: DC
  Administered 2018-09-09 – 2018-09-10 (×2): via INTRAVENOUS

## 2018-09-09 MED ORDER — BISACODYL 10 MG RE SUPP: 10.0000 mg | Freq: Every day | RECTAL | Status: DC | PRN

## 2018-09-09 MED ORDER — ONDANSETRON HCL 4 MG/2ML IJ SOLN
INTRAMUSCULAR | Status: AC
  Filled 2018-09-09: qty 2

## 2018-09-09 MED ORDER — MIDAZOLAM HCL 2 MG/2ML IJ SOLN
1.0000 mg | INTRAMUSCULAR | Status: DC
  Administered 2018-09-09: 1 mg via INTRAVENOUS
  Filled 2018-09-09: qty 2

## 2018-09-09 MED ORDER — METHOCARBAMOL 500 MG IVPB - SIMPLE MED
500.0000 mg | Freq: Four times a day (QID) | INTRAVENOUS | Status: DC | PRN
  Filled 2018-09-09: qty 50

## 2018-09-09 MED ORDER — LIDOCAINE HCL (PF) 1 % IJ SOLN
INTRAMUSCULAR | Status: AC
  Filled 2018-09-09: qty 30

## 2018-09-09 MED ORDER — STERILE WATER FOR IRRIGATION IR SOLN
Status: DC | PRN
  Administered 2018-09-09 (×2): 1000 mL

## 2018-09-09 MED ORDER — TRANEXAMIC ACID-NACL 1000-0.7 MG/100ML-% IV SOLN
1000.0000 mg | INTRAVENOUS | Status: AC
  Administered 2018-09-09: 1000 mg via INTRAVENOUS
  Filled 2018-09-09: qty 100

## 2018-09-09 MED ORDER — TRANEXAMIC ACID-NACL 1000-0.7 MG/100ML-% IV SOLN
1000.0000 mg | Freq: Once | INTRAVENOUS | Status: AC
  Administered 2018-09-09: 1000 mg via INTRAVENOUS
  Filled 2018-09-09: qty 100

## 2018-09-09 MED ORDER — METOPROLOL SUCCINATE ER 50 MG PO TB24
50.0000 mg | ORAL_TABLET | Freq: Every day | ORAL | Status: DC
  Administered 2018-09-10: 50 mg via ORAL
  Filled 2018-09-09: qty 1

## 2018-09-09 MED ORDER — BUPIVACAINE-EPINEPHRINE (PF) 0.25% -1:200000 IJ SOLN
INTRAMUSCULAR | Status: AC
  Filled 2018-09-09: qty 30

## 2018-09-09 MED ORDER — POVIDONE-IODINE 10 % EX SWAB
2.0000 "application " | Freq: Once | CUTANEOUS | Status: AC
  Administered 2018-09-09: 2 via TOPICAL

## 2018-09-09 MED ORDER — PHENYLEPHRINE 40 MCG/ML (10ML) SYRINGE FOR IV PUSH (FOR BLOOD PRESSURE SUPPORT)
PREFILLED_SYRINGE | INTRAVENOUS | Status: DC | PRN
  Administered 2018-09-09: 80 ug via INTRAVENOUS
  Administered 2018-09-09: 40 ug via INTRAVENOUS

## 2018-09-09 MED ORDER — ACETAMINOPHEN 325 MG PO TABS: 325.0000 mg | ORAL_TABLET | Freq: Four times a day (QID) | ORAL | Status: DC | PRN

## 2018-09-09 MED ORDER — PROPOFOL 10 MG/ML IV BOLUS
INTRAVENOUS | Status: AC
  Filled 2018-09-09: qty 20

## 2018-09-09 MED ORDER — HYDROMORPHONE HCL 1 MG/ML IJ SOLN: 0.2500 mg | INTRAMUSCULAR | Status: DC | PRN

## 2018-09-09 MED ORDER — ASPIRIN 81 MG PO CHEW
81.0000 mg | CHEWABLE_TABLET | Freq: Two times a day (BID) | ORAL | Status: DC
  Administered 2018-09-09 – 2018-09-10 (×2): 81 mg via ORAL
  Filled 2018-09-09 (×2): qty 1

## 2018-09-09 MED ORDER — PROPOFOL 10 MG/ML IV BOLUS
INTRAVENOUS | Status: DC | PRN
  Administered 2018-09-09: 20 mg via INTRAVENOUS

## 2018-09-09 MED ORDER — PHENOL 1.4 % MT LIQD
1.0000 | OROMUCOSAL | Status: DC | PRN
  Filled 2018-09-09: qty 177

## 2018-09-09 MED ORDER — METOCLOPRAMIDE HCL 5 MG PO TABS: 5.0000 mg | ORAL_TABLET | Freq: Three times a day (TID) | ORAL | Status: DC | PRN

## 2018-09-09 MED ORDER — LIDOCAINE HCL 1 % IJ SOLN
INTRAMUSCULAR | Status: DC | PRN
  Administered 2018-09-09: 6 mL

## 2018-09-09 MED ORDER — METHOCARBAMOL 500 MG PO TABS
500.0000 mg | ORAL_TABLET | Freq: Four times a day (QID) | ORAL | Status: DC | PRN
  Administered 2018-09-09 – 2018-09-10 (×2): 500 mg via ORAL
  Filled 2018-09-09 (×2): qty 1

## 2018-09-09 MED ORDER — ALUM & MAG HYDROXIDE-SIMETH 200-200-20 MG/5ML PO SUSP: 15.0000 mL | ORAL | Status: DC | PRN

## 2018-09-09 MED ORDER — ONDANSETRON HCL 4 MG PO TABS: 4.0000 mg | ORAL_TABLET | Freq: Four times a day (QID) | ORAL | Status: DC | PRN

## 2018-09-09 MED ORDER — CEFAZOLIN SODIUM-DEXTROSE 2-4 GM/100ML-% IV SOLN
2.0000 g | INTRAVENOUS | Status: AC
  Administered 2018-09-09: 2 g via INTRAVENOUS
  Filled 2018-09-09: qty 100

## 2018-09-09 MED ORDER — DOCUSATE SODIUM 100 MG PO CAPS
100.0000 mg | ORAL_CAPSULE | Freq: Two times a day (BID) | ORAL | Status: DC
  Administered 2018-09-09 – 2018-09-10 (×2): 100 mg via ORAL
  Filled 2018-09-09 (×2): qty 1

## 2018-09-09 MED ORDER — CHLORHEXIDINE GLUCONATE 4 % EX LIQD: 60.0000 mL | Freq: Once | CUTANEOUS | Status: DC

## 2018-09-09 MED ORDER — PRAVASTATIN SODIUM 20 MG PO TABS
20.0000 mg | ORAL_TABLET | Freq: Every day | ORAL | Status: DC
  Administered 2018-09-09: 20 mg via ORAL
  Filled 2018-09-09: qty 1

## 2018-09-09 MED ORDER — FAMOTIDINE 20 MG PO TABS
40.0000 mg | ORAL_TABLET | ORAL | Status: DC
  Administered 2018-09-10: 40 mg via ORAL
  Filled 2018-09-09: qty 2

## 2018-09-09 MED ORDER — METOCLOPRAMIDE HCL 5 MG/ML IJ SOLN: 5.0000 mg | Freq: Three times a day (TID) | INTRAMUSCULAR | Status: DC | PRN

## 2018-09-09 MED ORDER — FERROUS SULFATE 325 (65 FE) MG PO TABS
325.0000 mg | ORAL_TABLET | Freq: Two times a day (BID) | ORAL | Status: DC
  Administered 2018-09-09 – 2018-09-10 (×2): 325 mg via ORAL
  Filled 2018-09-09 (×2): qty 1

## 2018-09-09 MED ORDER — CELECOXIB 200 MG PO CAPS
200.0000 mg | ORAL_CAPSULE | Freq: Two times a day (BID) | ORAL | Status: DC
  Administered 2018-09-09 – 2018-09-10 (×2): 200 mg via ORAL
  Filled 2018-09-09 (×2): qty 1

## 2018-09-09 MED ORDER — HYDROCODONE-ACETAMINOPHEN 7.5-325 MG PO TABS
1.0000 | ORAL_TABLET | ORAL | Status: DC | PRN
  Administered 2018-09-09 – 2018-09-10 (×3): 1 via ORAL
  Administered 2018-09-10: 2 via ORAL
  Filled 2018-09-09: qty 1
  Filled 2018-09-09: qty 2
  Filled 2018-09-09 (×2): qty 1

## 2018-09-09 MED ORDER — METHYLPREDNISOLONE ACETATE 40 MG/ML IJ SUSP
INTRAMUSCULAR | Status: AC
  Filled 2018-09-09: qty 2

## 2018-09-09 MED ORDER — BUPIVACAINE HCL (PF) 0.5 % IJ SOLN
INTRAMUSCULAR | Status: DC | PRN
  Administered 2018-09-09: 20 mL via PERINEURAL

## 2018-09-09 MED ORDER — ONDANSETRON HCL 4 MG/2ML IJ SOLN
4.0000 mg | Freq: Four times a day (QID) | INTRAMUSCULAR | Status: DC | PRN
  Administered 2018-09-09: 21:00:00 4 mg via INTRAVENOUS
  Filled 2018-09-09: qty 2

## 2018-09-09 MED ORDER — LORAZEPAM 0.5 MG PO TABS
0.5000 mg | ORAL_TABLET | Freq: Two times a day (BID) | ORAL | Status: DC | PRN
  Administered 2018-09-09: 0.5 mg via ORAL
  Filled 2018-09-09: qty 1

## 2018-09-09 MED ORDER — METHYLPREDNISOLONE ACETATE 40 MG/ML IJ SUSP
INTRAMUSCULAR | Status: DC | PRN
  Administered 2018-09-09: 80 mg

## 2018-09-09 MED ORDER — SODIUM CHLORIDE 0.9 % IR SOLN
Status: DC | PRN
  Administered 2018-09-09: 1000 mL

## 2018-09-09 MED ORDER — CLONIDINE HCL (ANALGESIA) 100 MCG/ML EP SOLN
EPIDURAL | Status: DC | PRN
  Administered 2018-09-09: 70 ug

## 2018-09-09 MED ORDER — ONDANSETRON HCL 4 MG/2ML IJ SOLN
INTRAMUSCULAR | Status: DC | PRN
  Administered 2018-09-09: 4 mg via INTRAVENOUS

## 2018-09-09 MED ORDER — HYDROCODONE-ACETAMINOPHEN 5-325 MG PO TABS
1.0000 | ORAL_TABLET | ORAL | Status: DC | PRN
  Administered 2018-09-09: 1 via ORAL
  Filled 2018-09-09: qty 1

## 2018-09-09 MED ORDER — KETOROLAC TROMETHAMINE 30 MG/ML IJ SOLN
INTRAMUSCULAR | Status: DC | PRN
  Administered 2018-09-09: 30 mg

## 2018-09-09 MED ORDER — BUPIVACAINE IN DEXTROSE 0.75-8.25 % IT SOLN
INTRATHECAL | Status: DC | PRN
  Administered 2018-09-09: 1.6 mL via INTRATHECAL

## 2018-09-09 MED ORDER — SODIUM CHLORIDE (PF) 0.9 % IJ SOLN
INTRAMUSCULAR | Status: DC | PRN
  Administered 2018-09-09: 30 mL

## 2018-09-09 MED ORDER — SODIUM CHLORIDE (PF) 0.9 % IJ SOLN
INTRAMUSCULAR | Status: AC
  Filled 2018-09-09: qty 50

## 2018-09-09 MED ORDER — LEVOTHYROXINE SODIUM 112 MCG PO TABS
112.0000 ug | ORAL_TABLET | Freq: Every day | ORAL | Status: DC
Start: 2018-09-10 — End: 2018-09-10
  Administered 2018-09-10: 112 ug via ORAL
  Filled 2018-09-09: qty 1

## 2018-09-09 MED ORDER — DEXAMETHASONE SODIUM PHOSPHATE 10 MG/ML IJ SOLN
10.0000 mg | Freq: Once | INTRAMUSCULAR | Status: AC
  Administered 2018-09-10: 10 mg via INTRAVENOUS
  Filled 2018-09-09: qty 1

## 2018-09-09 MED ORDER — PROPOFOL 10 MG/ML IV BOLUS
INTRAVENOUS | Status: AC
  Filled 2018-09-09: qty 60

## 2018-09-09 MED ORDER — BUPIVACAINE-EPINEPHRINE (PF) 0.25% -1:200000 IJ SOLN
INTRAMUSCULAR | Status: DC | PRN
  Administered 2018-09-09: 30 mL

## 2018-09-09 MED ORDER — CEFAZOLIN SODIUM-DEXTROSE 2-4 GM/100ML-% IV SOLN
2.0000 g | Freq: Four times a day (QID) | INTRAVENOUS | Status: AC
Start: 2018-09-09 — End: 2018-09-09
  Administered 2018-09-09 (×2): 2 g via INTRAVENOUS
  Filled 2018-09-09 (×2): qty 100

## 2018-09-09 MED ORDER — PHENYLEPHRINE 40 MCG/ML (10ML) SYRINGE FOR IV PUSH (FOR BLOOD PRESSURE SUPPORT)
PREFILLED_SYRINGE | INTRAVENOUS | Status: AC
  Filled 2018-09-09: qty 10

## 2018-09-09 MED ORDER — VANCOMYCIN HCL 1000 MG IV SOLR
INTRAVENOUS | Status: AC
  Filled 2018-09-09: qty 1000

## 2018-09-09 MED ORDER — MORPHINE SULFATE (PF) 2 MG/ML IV SOLN
0.5000 mg | INTRAVENOUS | Status: DC | PRN
  Administered 2018-09-10: 1 mg via INTRAVENOUS
  Filled 2018-09-09: qty 1

## 2018-09-09 MED ORDER — LIDOCAINE 2% (20 MG/ML) 5 ML SYRINGE
INTRAMUSCULAR | Status: AC
  Filled 2018-09-09: qty 5

## 2018-09-09 SURGICAL SUPPLY — 56 items
ATTUNE MED ANAT PAT 32 KNEE (Knees) ×1 IMPLANT
ATTUNE PSFEM LTSZ5 NARCEM KNEE (Femur) ×1 IMPLANT
ATTUNE PSRP INSR SZ5 6 KNEE (Insert) ×1 IMPLANT
BAG ZIPLOCK 12X15 (MISCELLANEOUS) IMPLANT
BASEPLATE TIBIAL ROTATING SZ 4 (Knees) ×1 IMPLANT
BLADE SAW SGTL 11.0X1.19X90.0M (BLADE) IMPLANT
BLADE SAW SGTL 13.0X1.19X90.0M (BLADE) ×2 IMPLANT
BLADE SURG SZ10 CARB STEEL (BLADE) ×4 IMPLANT
BNDG ELASTIC 6X5.8 VLCR STR LF (GAUZE/BANDAGES/DRESSINGS) ×2 IMPLANT
BOWL SMART MIX CTS (DISPOSABLE) ×2 IMPLANT
CEMENT HV SMART SET (Cement) ×2 IMPLANT
COVER SURGICAL LIGHT HANDLE (MISCELLANEOUS) ×2 IMPLANT
COVER WAND RF STERILE (DRAPES) IMPLANT
CUFF TOURN SGL QUICK 34 (TOURNIQUET CUFF) ×1
CUFF TRNQT CYL 34X4.125X (TOURNIQUET CUFF) ×1 IMPLANT
DECANTER SPIKE VIAL GLASS SM (MISCELLANEOUS) ×4 IMPLANT
DERMABOND ADVANCED (GAUZE/BANDAGES/DRESSINGS) ×1
DERMABOND ADVANCED .7 DNX12 (GAUZE/BANDAGES/DRESSINGS) ×1 IMPLANT
DRAPE U-SHAPE 47X51 STRL (DRAPES) ×2 IMPLANT
DRESSING AQUACEL AG SP 3.5X10 (GAUZE/BANDAGES/DRESSINGS) ×1 IMPLANT
DRSG AQUACEL AG SP 3.5X10 (GAUZE/BANDAGES/DRESSINGS) ×2
DURAPREP 26ML APPLICATOR (WOUND CARE) ×4 IMPLANT
ELECT REM PT RETURN 15FT ADLT (MISCELLANEOUS) ×2 IMPLANT
GLOVE BIO SURGEON STRL SZ 6 (GLOVE) ×2 IMPLANT
GLOVE BIOGEL PI IND STRL 6.5 (GLOVE) ×1 IMPLANT
GLOVE BIOGEL PI IND STRL 7.5 (GLOVE) ×1 IMPLANT
GLOVE BIOGEL PI IND STRL 8.5 (GLOVE) ×1 IMPLANT
GLOVE BIOGEL PI INDICATOR 6.5 (GLOVE) ×1
GLOVE BIOGEL PI INDICATOR 7.5 (GLOVE) ×1
GLOVE BIOGEL PI INDICATOR 8.5 (GLOVE) ×1
GLOVE ECLIPSE 8.0 STRL XLNG CF (GLOVE) ×2 IMPLANT
GLOVE ORTHO TXT STRL SZ7.5 (GLOVE) ×2 IMPLANT
GOWN STRL REUS W/ TWL LRG LVL3 (GOWN DISPOSABLE) ×1 IMPLANT
GOWN STRL REUS W/TWL 2XL LVL3 (GOWN DISPOSABLE) ×2 IMPLANT
GOWN STRL REUS W/TWL LRG LVL3 (GOWN DISPOSABLE) ×3 IMPLANT
HANDPIECE INTERPULSE COAX TIP (DISPOSABLE) ×1
HOLDER FOLEY CATH W/STRAP (MISCELLANEOUS) ×1 IMPLANT
KIT TURNOVER KIT A (KITS) IMPLANT
MANIFOLD NEPTUNE II (INSTRUMENTS) ×2 IMPLANT
NDL SAFETY ECLIPSE 18X1.5 (NEEDLE) IMPLANT
NEEDLE HYPO 18GX1.5 SHARP (NEEDLE)
NS IRRIG 1000ML POUR BTL (IV SOLUTION) ×2 IMPLANT
PACK TOTAL KNEE CUSTOM (KITS) ×2 IMPLANT
PROTECTOR NERVE ULNAR (MISCELLANEOUS) ×2 IMPLANT
SET HNDPC FAN SPRY TIP SCT (DISPOSABLE) ×1 IMPLANT
SET PAD KNEE POSITIONER (MISCELLANEOUS) ×2 IMPLANT
SUT MNCRL AB 4-0 PS2 18 (SUTURE) ×2 IMPLANT
SUT STRATAFIX PDS+ 0 24IN (SUTURE) ×2 IMPLANT
SUT VIC AB 1 CT1 36 (SUTURE) ×2 IMPLANT
SUT VIC AB 2-0 CT1 27 (SUTURE) ×3
SUT VIC AB 2-0 CT1 TAPERPNT 27 (SUTURE) ×3 IMPLANT
SYR 3ML LL SCALE MARK (SYRINGE) ×2 IMPLANT
TRAY FOLEY MTR SLVR 16FR STAT (SET/KITS/TRAYS/PACK) ×2 IMPLANT
WATER STERILE IRR 1000ML POUR (IV SOLUTION) ×4 IMPLANT
WRAP KNEE MAXI GEL POST OP (GAUZE/BANDAGES/DRESSINGS) ×2 IMPLANT
YANKAUER SUCT BULB TIP 10FT TU (MISCELLANEOUS) ×2 IMPLANT

## 2018-09-09 NOTE — Plan of Care (Signed)

## 2018-09-09 NOTE — Discharge Instructions (Signed)

## 2018-09-09 NOTE — Anesthesia Postprocedure Evaluation (Signed)
Anesthesia Post Note  Patient: Linda Woods  Procedure(s) Performed: TOTAL KNEE ARTHROPLASTY, CORTISONE INJECTION RIGHT KNEE (Left Knee)     Patient location during evaluation: PACU Anesthesia Type: Spinal Level of consciousness: oriented and awake and alert Pain management: pain level controlled Vital Signs Assessment: post-procedure vital signs reviewed and stable Respiratory status: spontaneous breathing, respiratory function stable and patient connected to nasal cannula oxygen Cardiovascular status: blood pressure returned to baseline and stable Postop Assessment: no headache, no backache and no apparent nausea or vomiting Anesthetic complications: no    Last Vitals:  Vitals:   09/09/18 0956 09/09/18 1207  BP: 114/72   Pulse: 71   Resp: 14   Temp:  (P) 36.4 C  SpO2: 99%     Last Pain:  Vitals:   09/09/18 0816  TempSrc: Oral  PainSc:                  [name]

## 2018-09-09 NOTE — Anesthesia Procedure Notes (Signed)
Anesthesia Procedure Image    

## 2018-09-09 NOTE — Transfer of Care (Signed)
Immediate Anesthesia Transfer of Care Note  Patient: Linda Woods  Procedure(s) Performed: TOTAL KNEE ARTHROPLASTY, CORTISONE INJECTION RIGHT KNEE (Left Knee)  Patient Location: PACU  Anesthesia Type:Spinal and MAC combined with regional for post-op pain  Level of Consciousness: awake, alert  and oriented  Airway & Oxygen Therapy: Patient Spontanous Breathing and Patient connected to face mask oxygen  Post-op Assessment: Report given to RN and Post -op Vital signs reviewed and stable  Post vital signs: Reviewed and stable  Last Vitals:  Vitals Value Taken Time  BP 106/68 09/09/18 1207  Temp    Pulse 74 09/09/18 1208  Resp 14 09/09/18 1208  SpO2 100 % 09/09/18 1208  Vitals shown include unvalidated device data.  Last Pain:  Vitals:   09/09/18 0816  TempSrc: Oral  PainSc:       Patients Stated Pain Goal: 4 (63/84/53 6468)  Complications: No apparent anesthesia complications

## 2018-09-09 NOTE — Anesthesia Procedure Notes (Signed)
Spinal  Patient location during procedure: OR Start time: 09/09/2018 10:13 AM End time: 09/09/2018 10:17 AM Staffing Anesthesiologist: Myrtie Soman, MD Performed: anesthesiologist  Preanesthetic Checklist Completed: patient identified, site marked, surgical consent, pre-op evaluation, timeout performed, IV checked, risks and benefits discussed and monitors and equipment checked Spinal Block Patient position: sitting Prep: Betadine Patient monitoring: heart rate, continuous pulse ox and blood pressure Approach: midline Location: L4-5 Injection technique: single-shot Needle Needle type: Sprotte  Needle gauge: 24 G Needle length: 9 cm Additional Notes Expiration date of kit checked and confirmed. Patient tolerated procedure well, without complications.

## 2018-09-09 NOTE — Progress Notes (Signed)
Assisted Dr. Kalman Shan with left, ultrasound guided, adductor canal block. Side rails up, monitors on throughout procedure. See vital signs in flow sheet. Tolerated Procedure well.

## 2018-09-09 NOTE — Evaluation (Signed)
Physical Therapy Evaluation Patient Details Name: Linda Woods MRN: [id number] DOB: [birth year] Today's Date: 09/09/2018   History of Present Illness  Pt is a 68 y/o female s/p Left total knee replacement and Right knee intra-articular cortisone injection. PMH including but not limited to HTN and RLS.  Clinical Impression   Pt presents with mild L knee pain, decreased L knee ROM, difficulty performing and increased time and effort to perform mobility tasks, and decreased activity tolerance due to L knee pain. Pt to benefit from acute PT to address deficits. Pt ambulated hallway distance with RW with min guard assist, verbal cuing for form and safety provided throughout. Pt educated on ankle pumps (20/hour) to perform this afternoon/evening to increase circulation, to pt's tolerance and limited by pain. PT to progress mobility as tolerated, and will continue to follow acutely.        Follow Up Recommendations Follow surgeon's recommendation for DC plan and follow-up therapies;Supervision for mobility/OOB    Equipment Recommendations  None recommended by PT    Recommendations for Other Services       Precautions / Restrictions Precautions Precautions: Fall Restrictions Weight Bearing Restrictions: No Other Position/Activity Restrictions: WBAT      Mobility  Bed Mobility Overal bed mobility: Needs Assistance Bed Mobility: Supine to Sit     Supine to sit: HOB elevated;Min assist     General bed mobility comments: Min assist for light LLE assist, increased time to scoot to EOB.  Transfers Overall transfer level: Needs assistance Equipment used: Rolling walker (2 wheeled) Transfers: Sit to/from Stand Sit to Stand: Min guard;From elevated surface         General transfer comment: Min guard for safety, verbal cuing for hand placement when rising. No instability in WB present.  Ambulation/Gait Ambulation/Gait assistance: Min guard;+2 safety/equipment Gait Distance  (Feet): 100 Feet Assistive device: Rolling walker (2 wheeled) Gait Pattern/deviations: Step-to pattern;Step-through pattern;Decreased stride length;Trunk flexed Gait velocity: decr   General Gait Details: Min guard for safety, verbal cuing for sequencing, placement in RW, upright posture.  Stairs            Wheelchair Mobility    Modified Rankin (Stroke Patients Only)       Balance Overall balance assessment: Mild deficits observed, not formally tested                                           Pertinent Vitals/Pain Pain Assessment: 0-10 Pain Score: 3  Pain Location: L knee Pain Descriptors / Indicators: Sore Pain Intervention(s): Limited activity within patient's tolerance;Monitored during session;Premedicated before session;Repositioned;Ice applied    Home Living Family/patient expects to be discharged to:: Private residence Living Arrangements: Spouse/significant other Available Help at Discharge: Family;Available PRN/intermittently Type of Home: House Home Access: Stairs to enter   [employer] of Steps: 1 (through carport) Home Layout: Multi-level;Able to live on main level with bedroom/bathroom Home Equipment: Gilford Rile - 2 wheels;Cane - quad;Crutches;Shower seat - built in      Prior Function Level of Independence: Independent with assistive device(s)         Comments: Pt reports walking without AD most of the time PTA, but states she occasionally used cane if walking up/down steps. Pt enjoys painting and spending time with her grandkids.     Hand Dominance   Dominant Hand: Right    Extremity/Trunk Assessment   Upper Extremity Assessment Upper Extremity  Assessment: Overall WFL for tasks assessed    Lower Extremity Assessment Lower Extremity Assessment: Overall WFL for tasks assessed;LLE deficits/detail LLE Deficits / Details: suspected post-surgical weakness; able to perform ankle pumps, quad set, heel slide, SLR without  lift assist or quad lag LLE Sensation: WNL    Cervical / Trunk Assessment Cervical / Trunk Assessment: Normal  Communication   Communication: No difficulties  Cognition Arousal/Alertness: Awake/alert Behavior During Therapy: WFL for tasks assessed/performed Overall Cognitive Status: Within Functional Limits for tasks assessed                                        General Comments      Exercises     Assessment/Plan    PT Assessment Patient needs continued PT services  PT Problem List Decreased strength;Decreased mobility;Decreased range of motion;Decreased activity tolerance;Decreased balance;Decreased knowledge of use of DME;Pain       PT Treatment Interventions DME instruction;Therapeutic activities;Gait training;Therapeutic exercise;Patient/family education;Balance training;Stair training;Functional mobility training    PT Goals (Current goals can be found in the Care Plan section)  Acute Rehab PT Goals Patient Stated Goal: go home tomorrow PT Goal Formulation: With patient Time For Goal Achievement: 09/16/18 Potential to Achieve Goals: Good    Frequency 7X/week   Barriers to discharge        Co-evaluation               AM-PAC PT "6 Clicks" Mobility  Outcome Measure Help needed turning from your back to your side while in a flat bed without using bedrails?: A Little Help needed moving from lying on your back to sitting on the side of a flat bed without using bedrails?: A Little Help needed moving to and from a bed to a chair (including a wheelchair)?: A Little Help needed standing up from a chair using your arms (e.g., wheelchair or bedside chair)?: A Little Help needed to walk in hospital room?: A Little Help needed climbing 3-5 steps with a railing? : A Little 6 Click Score: 18    End of Session Equipment Utilized During Treatment: Gait belt Activity Tolerance: Patient tolerated treatment well Patient left: in chair;with call  bell/phone within reach;with SCD's reapplied(pt verbally agrees not to mobilize back to bed without pressing call button and waiting for NT/RN assist) Nurse Communication: Mobility status PT Visit Diagnosis: Other abnormalities of gait and mobility (R26.89);Difficulty in walking, not elsewhere classified (R26.2)    Time: 1645-1700 PT Time Calculation (min) (ACUTE ONLY): 15 min   Charges:   PT Evaluation $PT Eval Low Complexity: 1 Low         Julien Girt, PT Acute Rehabilitation Services Pager [phone]  Office [phone]  Roxine Caddy D Elonda Husky 09/09/2018, 5:33 PM

## 2018-09-09 NOTE — Op Note (Addendum)
NAME:  Linda Woods                      MEDICAL RECORD NO.:  [id number]                             FACILITY:  [facility]      PHYSICIAN:  Pietro Cassis. Alvan Dame, M.D.  DATE OF BIRTH:  Aug 12, 1950      DATE OF PROCEDURE:  09/09/2018                                     OPERATIVE REPORT         PREOPERATIVE DIAGNOSIS:  Left knee osteoarthritis. 2. Right knee osteoarthritis     POSTOPERATIVE DIAGNOSIS:  Left knee osteoarthritis. 2. Right knee osteoarthritis     FINDINGS:  The patient was noted to have complete loss of cartilage and   bone-on-bone arthritis with associated osteophytes in the lateral and patellofemoral compartments of   the knee with a significant synovitis and associated effusion.  The patient had failed months of conservative treatment including medications, injection therapy, activity modification.     PROCEDURE:  Left total knee replacement. 2. Right knee intra-articular cortisone injection     COMPONENTS USED:  DePuy Attune rotating platform posterior stabilized knee   system, a size 5N femur, 4 tibia, size 6 mm PS AOX insert, and 32 anatomic patellar   button.      SURGEON:  Pietro Cassis. Alvan Dame, M.D.      ASSISTANT:  Griffith Citron, PA-C.      ANESTHESIA:  Regional and Spinal.      SPECIMENS:  None.      COMPLICATION:  None.      DRAINS:  None.  EBL: <100cc      TOURNIQUET TIME:  27 min at 250 mmHg      The patient was stable to the recovery room.      INDICATION FOR PROCEDURE:  Linda Woods is a 68 y.o. female patient of   mine.  The patient had been seen, evaluated, and treated for months conservatively in the   office with medication, activity modification, and injections.  The patient had   radiographic changes of bone-on-bone arthritis with endplate sclerosis and osteophytes noted.  Based on the radiographic changes and failed conservative measures, the patient   decided to proceed with definitive treatment, total knee replacement.  Risks of infection, DVT,  component failure, need for revision surgery, neurovascular injury were reviewed in the office setting.  The postop course was reviewed stressing the efforts to maximize post-operative satisfaction and function.  Consent was obtained for benefit of pain   relief.      PROCEDURE IN DETAIL:  The patient was brought to the operative theater.   Once adequate anesthesia, preoperative antibiotics, 2 gm of Ancef,1 gm of Tranexamic Acid, and 10 mg of Decadron administered, the patient was positioned supine with a left thigh tourniquet placed.  The  left lower extremity was prepped and draped in sterile fashion.  A time-   out was performed identifying the patient, planned procedure, and the appropriate extremity.      The left lower extremity was placed in the [facility] leg holder.  The leg was   exsanguinated, tourniquet elevated to 250 mmHg.  A midline incision was   made followed by  median parapatellar arthrotomy.  Following initial   exposure, attention was first directed to the patella.  Precut   measurement was noted to be 22 mm.  I resected down to 13 mm and used a   32 anatomic patellar button to restore patellar height as well as cover the cut surface.      The lug holes were drilled and a metal shim was placed to protect the   patella from retractors and saw blade during the procedure.      At this point, attention was now directed to the femur.  The femoral   canal was opened with a drill, irrigated to try to prevent fat emboli.  An   intramedullary rod was passed at 5 degrees valgus, 9 mm of bone was   resected off the distal femur.  Following this resection, the tibia was   subluxated anteriorly.  Using the extramedullary guide, 2 mm of bone was resected off   the proximal medial tibia.  We confirmed the gap would be   stable medially and laterally with a size 5 spacer block as well as confirmed that the tibial cut was perpendicular in the coronal plane, checking with an alignment rod.       Once this was done, I sized the femur to be a size 5 in the anterior-   posterior dimension, chose a narrow component based on medial and   lateral dimension.  The size 5 rotation block was then pinned in   position anterior referenced using the C-clamp to set rotation.  The   anterior, posterior, and  chamfer cuts were made without difficulty nor   notching making certain that I was along the anterior cortex to help   with flexion gap stability.      The final box cut was made off the lateral aspect of distal femur.      At this point, the tibia was sized to be a size 4.  The size 4 tray was   then pinned in position through the medial third of the tubercle,   drilled, and keel punched.  Trial reduction was now carried with a 5 femur,  4 tibia, a size 6 mm PS insert, and the 32 anatomic patella botton.  The knee was brought to full extension with good flexion stability with the patella   tracking through the trochlea without application of pressure.  Given   all these findings the trial components removed.  Final components were   opened and cement was mixed.  The knee was irrigated with normal saline solution and pulse lavage.  The synovial lining was   then injected with 30 cc of 0.25% Marcaine with epinephrine, 1 cc of Toradol and 30 cc of NS for a total of 61 cc.     Final implants were then cemented onto cleaned and dried cut surfaces of bone with the knee brought to extension with a size 6 mm PS trial insert.      Once the cement had fully cured, excess cement was removed   throughout the knee.  I confirmed that I was satisfied with the range of   motion and stability, and the final size 6 mm PS AOX insert was chosen.  It was   placed into the knee.      The tourniquet had been let down at 27 minutes.  No significant   hemostasis was required.  The extensor mechanism was then reapproximated using #1 Vicryl and #1 Stratafix  sutures with the knee   in flexion.  The   remaining  wound was closed with 2-0 Vicryl and running 4-0 Monocryl.   The knee was cleaned, dried, dressed sterilely using Dermabond and   Aquacel dressing.  The patient was then   brought to recovery room in stable condition, tolerating the procedure   well.   2.  Upon completion of the left total knee replacement the right knee was prepped laterally for injection.  The knee was then injected with 80 mg of Depomedrol with 6 cc of Lidocaine.  Injection site covered with Bandaid  Please note that Physician Assistant, Griffith Citron, PA-C was present for the entirety of the case, and was utilized for pre-operative positioning, peri-operative retractor management, general facilitation of the procedure and for primary wound closure at the end of the case.              Pietro Cassis Alvan Dame, M.D.    09/09/2018 10:10 AM

## 2018-09-09 NOTE — Anesthesia Procedure Notes (Signed)
Anesthesia Regional Block: Adductor canal block   Pre-Anesthetic Checklist: ,, timeout performed, Correct Patient, Correct Site, Correct Laterality, Correct Procedure, Correct Position, site marked, Risks and benefits discussed,  Surgical consent,  Pre-op evaluation,  At surgeon's request and post-op pain management  Laterality: Left  Prep: chloraprep       Needles:  Injection technique: Single-shot  Needle Type: Echogenic Needle     Needle Length: 9cm      Additional Needles:   Procedures:,,,, ultrasound used (permanent image in chart),,,,  Narrative:  Start time: 09/09/2018 9:21 AM End time: 09/09/2018 9:31 AM Injection made incrementally with aspirations every 5 mL.  Performed by: Personally  Anesthesiologist: Myrtie Soman, MD  Additional Notes: Patient tolerated the procedure well without complications

## 2018-09-09 NOTE — Interval H&P Note (Signed)
History and Physical Interval Note:  09/09/2018 9:00 AM  Linda Woods  has presented today for surgery, with the diagnosis of Left knee osteoarthritis.  The various methods of treatment have been discussed with the patient and family. After consideration of risks, benefits and other options for treatment, the patient has consented to  Procedure(s) with comments: TOTAL KNEE ARTHROPLASTY (Left) - 70 mins as a surgical intervention.  The patient's history has been reviewed, patient examined, no change in status, stable for surgery.  I have reviewed the patient's chart and labs.  Questions were answered to the patient's satisfaction.     Mauri Pole

## 2018-09-10 ENCOUNTER — Encounter (HOSPITAL_COMMUNITY): Payer: Self-pay | Admitting: Orthopedic Surgery

## 2018-09-10 LAB — BASIC METABOLIC PANEL
Anion gap: 7 (ref 5–15)
BUN: 7 mg/dL — ABNORMAL LOW (ref 8–23)
CO2: 24 mmol/L (ref 22–32)
Calcium: 8.7 mg/dL — ABNORMAL LOW (ref 8.9–10.3)
Chloride: 109 mmol/L (ref 98–111)
Creatinine, Ser: 0.6 mg/dL (ref 0.44–1.00)
GFR calc Af Amer: >60 mL/min (ref >60)
GFR calc non Af Amer: >60 mL/min (ref >60)
Glucose, Bld: 175 mg/dL — ABNORMAL HIGH (ref 70–99)
Potassium: 4 mmol/L (ref 3.5–5.1)
Sodium: 140 mmol/L (ref 135–145)

## 2018-09-10 LAB — CBC
HCT: 35.3 % — ABNORMAL LOW (ref 36.0–46.0)
Hemoglobin: 11.7 g/dL — ABNORMAL LOW (ref 12.0–15.0)
MCH: 31 pg (ref 26.0–34.0)
MCHC: 33.1 g/dL (ref 30.0–36.0)
MCV: 93.6 fL (ref 80.0–100.0)
Platelets: 227 10*3/uL (ref 150–400)
RBC: 3.77 MIL/uL — ABNORMAL LOW (ref 3.87–5.11)
RDW: 12.1 % (ref 11.5–15.5)
WBC: 10.9 10*3/uL — ABNORMAL HIGH (ref 4.0–10.5)
nRBC: 0 % (ref 0.0–0.2)

## 2018-09-10 MED ORDER — FERROUS SULFATE 325 (65 FE) MG PO TABS: 325.0000 mg | ORAL_TABLET | Freq: Three times a day (TID) | ORAL | 0 refills | Status: DC

## 2018-09-10 MED ORDER — ASPIRIN 81 MG PO CHEW: 81.0000 mg | CHEWABLE_TABLET | Freq: Two times a day (BID) | ORAL | 0 refills | Status: AC

## 2018-09-10 MED ORDER — POLYETHYLENE GLYCOL 3350 17 G PO PACK: 17.0000 g | PACK | Freq: Two times a day (BID) | ORAL | 0 refills | Status: DC

## 2018-09-10 MED ORDER — HYDROCODONE-ACETAMINOPHEN 7.5-325 MG PO TABS: 1.0000 | ORAL_TABLET | ORAL | 0 refills | Status: DC | PRN

## 2018-09-10 MED ORDER — METHOCARBAMOL 500 MG PO TABS: 500.0000 mg | ORAL_TABLET | Freq: Four times a day (QID) | ORAL | 0 refills | Status: DC | PRN

## 2018-09-10 MED ORDER — DOCUSATE SODIUM 100 MG PO CAPS: 100.0000 mg | ORAL_CAPSULE | Freq: Two times a day (BID) | ORAL | 0 refills | Status: DC

## 2018-09-10 NOTE — Progress Notes (Signed)
Discharge Plan of Care: OPPT Has DME (3 in1, RW and CPM)

## 2018-09-10 NOTE — Progress Notes (Signed)
     Subjective: 1 Day Post-Op Procedure(s) (LRB): TOTAL KNEE ARTHROPLASTY, CORTISONE INJECTION RIGHT KNEE (Left)   Patient reports pain as mild in the left TKA and almost nothing as the cortisone injection has helped significantly in the right knee.  No reported events throughout the night.  Dr. Alvan Dame discussed the procedure, findings and expectations moving forward.  Discussed the CPM and trying to get approval for home use through the hospital.   Ready to be discharged home.    Patient's anticipated LOS is less than 2 midnights, meeting these requirements: - Lives within 1 hour of care - Has a competent adult at home to recover with post-op recover - NO history of  - Chronic pain requiring opiods  - Diabetes  - Coronary Artery Disease  - Heart failure  - Heart attack  - Stroke  - DVT/VTE  - Cardiac arrhythmia  - Respiratory Failure/COPD  - Renal failure  - Anemia  - Advanced Liver disease       Objective:   VITALS:   Vitals:   09/10/18 0112 09/10/18 0602  BP: 134/77 109/79  Pulse: 75 70  Resp: 16 16  Temp: 97.8 F (36.6 C) 97.9 F (36.6 C)  SpO2: 96% 95%    Dorsiflexion/Plantar flexion intact Incision: dressing C/D/I No cellulitis present Compartment soft  LABS Recent Labs    09/10/18 0252  HGB 11.7*  HCT 35.3*  WBC 10.9*  PLT 227    Recent Labs    09/10/18 0252  NA 140  K 4.0  BUN 7*  CREATININE 0.60  GLUCOSE 175*     Assessment/Plan: 1 Day Post-Op Procedure(s) (LRB): TOTAL KNEE ARTHROPLASTY, CORTISONE INJECTION RIGHT KNEE (Left) Foley cath d/c'ed Advance diet Up with therapy D/C IV fluids Discharge home  Follow up in 2 weeks at [facility] ([place]). Follow up with OLIN,Arjan Strohm D in 2 weeks.  Contact information:  [facility]) [address], [place] [phone]    Overweight (BMI 25-29.9) Estimated body mass index is 28.33 kg/m as  calculated from the following:   Height as of this encounter: 5\' 6"  (1.676 m).   Weight as of this encounter: 79.6 kg. Patient also counseled that weight may inhibit the healing process Patient counseled that losing weight will help with future health issues      West Pugh. Sheryl Saintil   PAC  09/10/2018, 8:10 AM

## 2018-09-10 NOTE — Plan of Care (Signed)
   Problem: Education: Goal: Knowledge of General Education information will improve Description: Including pain rating scale, medication(s)/side effects and non-pharmacologic comfort measures Outcome: Progressing   Problem: Health Behavior/Discharge Planning: Goal: Ability to manage health-related needs will improve Outcome: Progressing   Problem: Clinical Measurements: Goal: Ability to maintain clinical measurements within normal limits will improve Outcome: Progressing   Problem: Clinical Measurements: Goal: Will remain free from infection Outcome: Progressing   Problem: Pain Management: Goal: Pain level will decrease with appropriate interventions Outcome: Progressing

## 2018-09-10 NOTE — Progress Notes (Signed)
Physical Therapy Treatment Patient Details Name: Linda Woods MRN: [id number] DOB: 1950-10-12 Today's Date: 09/10/2018    History of Present Illness Pt is a 68 y/o female s/p Left total knee replacement and Right knee intra-articular cortisone injection. PMH including but not limited to HTN and RLS.    PT Comments    Progressing well with mobility. Reviewed/practiced exercises, gait training, and stair training. Issued HEP for pt to perform 3x/day until she begins OPPT. Pt stated she is planning to have a CPM at home. Spoke with ortho tech who stated agency who delivers CPM will review proper use with her. All education completed. Okay to d/c from PT standpoint.    Follow Up Recommendations  Follow surgeon's recommendation for DC plan and follow-up therapies;Supervision for mobility/OOB(OPPT)     Equipment Recommendations  None recommended by PT    Recommendations for Other Services       Precautions / Restrictions Precautions Precautions: Fall Restrictions Weight Bearing Restrictions: No Other Position/Activity Restrictions: WBAT    Mobility  Bed Mobility Overal bed mobility: Modified Independent Bed Mobility: Supine to Sit;Sit to Supine     Supine to sit: Modified independent (Device/Increase time) Sit to supine: Modified independent (Device/Increase time)      Transfers Overall transfer level: Needs assistance Equipment used: Rolling walker (2 wheeled) Transfers: Sit to/from Stand Sit to Stand: Supervision         General transfer comment: for safety. VC safety, hand placement  Ambulation/Gait Ambulation/Gait assistance: Supervision Gait Distance (Feet): 150 Feet Assistive device: Rolling walker (2 wheeled) Gait Pattern/deviations: Step-through pattern;Decreased stride length     General Gait Details: for safety. Pt tolerated distance well.   Stairs Stairs: Yes Stairs assistance: Min guard Stair Management: Step to pattern;Forwards;With  walker Number of Stairs: 1 General stair comments: close guard for safety. VCs safety, technique, sequence.   Wheelchair Mobility    Modified Rankin (Stroke Patients Only)       Balance Overall balance assessment: Mild deficits observed, not formally tested                                          Cognition Arousal/Alertness: Awake/alert Behavior During Therapy: WFL for tasks assessed/performed Overall Cognitive Status: Within Functional Limits for tasks assessed                                        Exercises Total Joint Exercises Ankle Circles/Pumps: AROM;Both;10 reps;Supine Quad Sets: AROM;Both;10 reps;Supine Heel Slides: AROM;10 reps;Supine Hip ABduction/ADduction: AROM;Left;10 reps;Supine Straight Leg Raises: AROM;Left;10 reps;Supine Long Arc Quad: AROM;Left;10 reps;Seated Knee Flexion: AROM;Left;10 reps;Seated Goniometric ROM: ~10-90    General Comments        Pertinent Vitals/Pain Pain Assessment: 0-10 Pain Score: 6  Pain Location: L knee Pain Descriptors / Indicators: Aching;Sore Pain Intervention(s): Monitored during session;Ice applied    Home Living                      Prior Function            PT Goals (current goals can now be found in the care plan section) Progress towards PT goals: Progressing toward goals    Frequency    7X/week      PT Plan Current plan remains appropriate    Co-evaluation  AM-PAC PT "6 Clicks" Mobility   Outcome Measure  Help needed turning from your back to your side while in a flat bed without using bedrails?: None Help needed moving from lying on your back to sitting on the side of a flat bed without using bedrails?: None Help needed moving to and from a bed to a chair (including a wheelchair)?: A Little Help needed standing up from a chair using your arms (e.g., wheelchair or bedside chair)?: A Little Help needed to walk in hospital room?: A  Little Help needed climbing 3-5 steps with a railing? : A Little 6 Click Score: 20    End of Session Equipment Utilized During Treatment: Gait belt Activity Tolerance: Patient tolerated treatment well Patient left: in bed;with call bell/phone within reach   PT Visit Diagnosis: Other abnormalities of gait and mobility (R26.89);Pain Pain - Right/Left: Left Pain - part of body: Knee     Time: 7943-2761 PT Time Calculation (min) (ACUTE ONLY): 22 min  Charges:  $Gait Training: 8-22 mins                        Weston Anna, PT Acute Rehabilitation Services Pager: [phone] Office: [phone]

## 2018-09-10 NOTE — Progress Notes (Signed)
The pt was provided with d/c instructions. After discussing the pt's plan of care upon d/c home, the pt reported no further questions or concerns. Pt is currently waiting for their ride to arrive.

## 2018-09-15 ENCOUNTER — Encounter (HOSPITAL_COMMUNITY): Payer: Self-pay | Admitting: Physical Therapy

## 2018-09-15 ENCOUNTER — Other Ambulatory Visit: Payer: Self-pay

## 2018-09-15 ENCOUNTER — Encounter

## 2018-09-15 ENCOUNTER — Ambulatory Visit (HOSPITAL_COMMUNITY): Payer: Medicare Other | Attending: Orthopedic Surgery | Admitting: Physical Therapy

## 2018-09-15 DIAGNOSIS — M25562 Pain in left knee: Secondary | ICD-10-CM

## 2018-09-15 DIAGNOSIS — G8918 Other acute postprocedural pain: Secondary | ICD-10-CM | POA: Insufficient documentation

## 2018-09-15 DIAGNOSIS — G8929 Other chronic pain: Secondary | ICD-10-CM | POA: Insufficient documentation

## 2018-09-15 DIAGNOSIS — R29898 Other symptoms and signs involving the musculoskeletal system: Secondary | ICD-10-CM | POA: Diagnosis present

## 2018-09-15 DIAGNOSIS — R600 Localized edema: Secondary | ICD-10-CM | POA: Diagnosis present

## 2018-09-15 DIAGNOSIS — R262 Difficulty in walking, not elsewhere classified: Secondary | ICD-10-CM

## 2018-09-15 NOTE — Therapy (Signed)
[place], [place], [address] Phone: [phone]   Fax:  [phone]  Physical Therapy Evaluation  Patient Details  Name: Linda Woods MRN: [id number] Date of Birth: November 09, 1950 Referring Provider (PT): Isac Caddy    Encounter Date: 09/15/2018  PT End of Session - 09/15/18 1206    Visit Number  1    Number of Visits  12    Date for PT Re-Evaluation  10/14/18    Authorization Type  [employer] Medicare (progress note and G-code due visit 10)    Authorization Time Period  09/15/18 to 10/16/18    Authorization - Visit Number  1    Authorization - Number of Visits  10    PT Start Time  1030   patient arrived late   PT Stop Time  1100    PT Time Calculation (min)  30 min    Activity Tolerance  Patient limited by pain    Behavior During Therapy  [facility] for tasks assessed/performed       Past Medical History:  Diagnosis Date  . Bursitis   . Chronic reflux esophagitis   . Complication of anesthesia   . Diverticulosis   . GERD (gastroesophageal reflux disease)   . History of kidney stones   . Hypertension   . Knee pain    right knee-seeing ortho  . Osteoarthritis   . Plantar fasciitis   . Pure hypercholesterolemia   . RLS (restless legs syndrome)   . Thyroid disease    hypothyroid    Past Surgical History:  Procedure Laterality Date  . APPENDECTOMY    . CARPAL TUNNEL RELEASE Right 2011   Dr. Amedeo Plenty  . COLPORRHAPHY     posterior  . HAND SURGERY Right 2016   Nerve surgery, Dr. Amedeo Plenty  . OVARIAN CYST REMOVAL    . RECTOCELE REPAIR  2011   w/TVH and sling  . TONSILLECTOMY    . TONSILLECTOMY    . TOTAL KNEE ARTHROPLASTY Left 09/09/2018   Procedure: TOTAL KNEE ARTHROPLASTY, CORTISONE INJECTION RIGHT KNEE;  Surgeon: Paralee Cancel, MD;  Location: [facility];  Service: Orthopedics;  Laterality: Left;  70 mins  . TOTAL VAGINAL HYSTERECTOMY  10/18/2009   rectocele repair, sling  . TUBAL LIGATION Bilateral    . VARICOSE VEIN SURGERY      There were no vitals filed for this visit.   Subjective Assessment - 09/15/18 1027    Subjective  Received L TKR on 7/21, did not received HHPT and was sent to [facility] for rehab. Walking is hard, has not tried steps yet. Had a fall 2 weeks before surgery tripping on dog, still a little sore from this.    Limitations  Sitting;Lifting;Standing;Walking    How long can you sit comfortably?  30 minutes    How long can you stand comfortably?  10 minutes    How long can you walk comfortably?  half a block    Patient Stated Goals  get back to PLOF and more, get down on knees    Currently in Pain?  Yes    Pain Score  6     Pain Location  Knee    Pain Orientation  Left    Pain Descriptors / Indicators  Other (Comment);Shooting;Burning   searing pain   Pain Type  Acute pain    Pain Radiating Towards  down shin to foot    Pain Onset  1 to 4 weeks ago  Pain Frequency  Constant    Aggravating Factors   activity in general    Pain Relieving Factors  ice and meds    Effect of Pain on Daily Activities  severe         [facility] PT Assessment - 09/15/18 0001      Assessment   Medical Diagnosis  L TKR     Referring Provider (PT)  Rodman Key Babish/[facility]     Onset Date/Surgical Date  09/09/18    Next MD Visit  August 5th     Prior Therapy  none       Precautions   Precaution Comments  TKR       Restrictions   Other Position/Activity Restrictions  WBAT       Balance Screen   Has the patient fallen in the past 6 months  Yes    How many times?  1    Has the patient had a decrease in activity level because of a fear of falling?   No    Is the patient reluctant to leave their home because of a fear of falling?   No      Home Film/video editor residence      Prior Function   Level of Independence  Independent      Observation/Other Assessments   Skin Integrity  incision present from TKR       Observation/Other Assessments-Edema     Edema  --   localized edema noted, not assessed due to time constraints      AROM   Left Knee Extension  9    Left Knee Flexion  82      Strength   Right Hip ABduction  5/5    Left Hip ABduction  4/5    Right Knee Flexion  4+/5    Right Knee Extension  4+/5    Left Knee Flexion  4/5    Left Knee Extension  4/5    Right Ankle Dorsiflexion  5/5    Left Ankle Dorsiflexion  5/5      Ambulation/Gait   Assistive device  Rolling walker    Gait Pattern  Step-to pattern;Decreased step length - right;Decreased stance time - left;Decreased stride length;Decreased dorsiflexion - left;Decreased weight shift to left;Antalgic;Decreased trunk rotation;Wide base of support;Poor foot clearance - left      High Level Balance   High Level Balance Comments  narrow BOS 30 seconds, tandem stance 15 seconds R foot, 3 seconds L                 Objective measurements completed on examination: See above findings.              PT Education - 09/15/18 1205    Education Details  exam findings, POC, continue hospital HEP/HEP updates and walking program, edema management, POC moving forward, rehab expectations    Person(s) Educated  Patient    Methods  Explanation;Demonstration    Comprehension  Verbalized understanding       PT Short Term Goals - 09/15/18 1210      PT SHORT TERM GOAL #1   Title  Patient to demonstrate improved gait pattern with elimination of antalgic pattern and pain no more than 2/10 with gait    Time  2    Period  Weeks    Status  New    Target Date  09/29/18      PT SHORT TERM GOAL #2   Title  Patient to be able to maintain tandem stance for at least 20 seconds bilaterally with no UE support to show improved functional balance    Time  2    Period  Weeks      PT SHORT TERM GOAL #3   Title  Patient to be compliant with HEP, to updated PRN    Time  2    Period  Weeks    Status  New        PT Long Term Goals - 09/15/18 1212      PT LONG TERM GOAL #1    Title  L knee ROM to have improved to 0-110 degrees in order to improve gait mechanics and reduce feelings of pain/stiffness    Time  4    Period  Weeks    Status  New    Target Date  10/16/18      PT LONG TERM GOAL #2   Title  Patient to tolerate gait at least 1578ft without increased pain to improve ability to access community    Time  4    Period  Weeks    Status  New      PT LONG TERM GOAL #3   Title  Patient to demonstrate MMT as being at least 4+/5 in all tested groups in order to improve function and reduce pain    Time  4    Period  Weeks    Status  New      PT LONG TERM GOAL #4   Title  Patient to be able to reciprocally ascend/descend flight of steps with U railing and no increase in pain to improve community access    Time  4    Period  Weeks    Status  New      PT LONG TERM GOAL #5   Title  Patient to be able to kneel on one knee with pain no more than 4/10 in order to improve functional mobility and allow for tasks such as gardening and cleaning    Time  4    Period  Weeks    Status  New             Plan - 09/15/18 1207    Clinical Impression Statement  Patient arrives 6 days after L TKR surgery, reports she is doing well and has been compliant with HEP/walking program from the hospital. She did not receive HHPT. Examination reveals typical presentation following TKR procedure, including localized edema, limited ROM, gait and balance impairment, reduced strength and functional activity tolerance, and pain. Education provided on typical course of care following TKR as well as expectations from skilled rehab services. She will benefit from skilled PT services to address functional impairments and assist in return to optimal level of function.    Personal Factors and Comorbidities  Age;Fitness;Comorbidity 2    Examination-Activity Limitations  Bed Mobility;Stand;Lift;Locomotion Level;Bend;Reach Overhead;Transfers;Carry;Sit;Sleep;Squat;Stairs     Examination-Participation Restrictions  Church;Yard Work;Cleaning;Meal Prep;Community Activity;Driving;Laundry;Shop    Stability/Clinical Decision Making  Stable/Uncomplicated    Clinical Decision Making  Low    Rehab Potential  Good    PT Frequency  3x / week    PT Duration  4 weeks    PT Treatment/Interventions  ADLs/Self Care Home Management;Cryotherapy;Electrical Stimulation;Iontophoresis 4mg /ml Dexamethasone;Moist Heat;DME Instruction;Gait training;Stair training;Functional mobility training;Therapeutic activities;Therapeutic exercise;Balance training;Neuromuscular re-education;Patient/family education;Manual techniques;Compression bandaging;Scar mobilization;Passive range of motion;Dry needling;Taping    PT Next Visit Plan  work on ROM, gait pattern, hip strength, reveiw edema control; progress  HEP as appropriate    PT Home Exercise Plan  hospital HEP plus focus on supine heel slides and seated flexion overpressure, walking program, edema management program    Recommended Other Services  may need thigh high compression stockings    Consulted and Agree with Plan of Care  Patient       Patient will benefit from skilled therapeutic intervention in order to improve the following deficits and impairments:  Abnormal gait, Decreased knowledge of use of DME, Decreased skin integrity, Increased fascial restricitons, Impaired sensation, Improper body mechanics, Pain, Decreased coordination, Decreased mobility, Increased muscle spasms, Impaired tone, Decreased activity tolerance, Decreased range of motion, Decreased strength, Hypomobility, Decreased balance, Decreased safety awareness, Difficulty walking, Increased edema  Visit Diagnosis: 1. Acute postoperative pain of left knee   2. Localized edema   3. Difficulty in walking, not elsewhere classified   4. Other symptoms and signs involving the musculoskeletal system     G-Codes - 09/30/2018 1216    Functional Assessment Tool Used (Outpatient Only)   FOTO/skilled clinical assessment    Functional Limitation  Mobility: Walking and moving around    Mobility: Walking and Moving Around Current Status [phone])  At least 60 percent but less than 80 percent impaired, limited or restricted    Mobility: Walking and Moving Around Goal Status [phone])  At least 20 percent but less than 40 percent impaired, limited or restricted        Problem List Patient Active Problem List   Diagnosis Date Noted  . S/P left TKA 09/09/2018  . Status post total left knee replacement 09/09/2018  . Pain of left heel 07/03/2017  . Pain in joint of right hip 05/17/2017  . Benign essential HTN 11/17/2015  . Gastro-esophageal reflux disease without esophagitis 10/12/2014  . Hypertriglyceridemia 10/12/2014  . Hypertension 05/21/2013  . Unspecified hypothyroidism 05/21/2013  . Unspecified vitamin D deficiency 05/21/2013    Deniece Ree PT, DPT, CBIS  Supplemental Physical Therapist [facility]    Pager [phone] Acute Rehab Office [place] [address] [place], [place], [address] Phone: [phone]   Fax:  [phone]  Name: AZYRIA OSMON MRN: [id number] Date of Birth: November 05, 1950

## 2018-09-15 NOTE — Patient Instructions (Signed)
EDEMA MANAGEMENT- husband helps  Laying on your back with leg supported (knee straight) have you husband use a light touch and, starting from your ankle, gently stroke up to above your knee.   Repeat for 3-5 minutes, 3 times a day.      SUPINE HEEL SLIDES - STRETCH  While lying on your back place a belt, towel, strap or bed sheet around your foot and start by pulling with your arms to bend your knee into a bent position until a gentle stretch is felt and hold this position.   Then allow your knee to straighten back out to starting position and hold for 5 seconds-  repeat 10 times, 3 times a day.     HEEL SLIDES - SELF ASSISTED  While seated, slide your heel towards your buttock with the assist of the unaffected leg.  Hold for 5 seconds, then relax.  Repeat 10 times, 3 times a day.    ICE 3-5 TIMES PER DAY, 15-20 MINUTES.    [place] HEP, 3X/DAY AND PROGRESS REPS AS ABLE.   CONTINUE TO PROGRESS WALKING PROGRAM AT HOME.

## 2018-09-16 NOTE — Discharge Summary (Signed)
Physician Discharge Summary  Patient ID: Linda Woods MRN: [id number] DOB/AGE: 68-Jan-1952 68 y.o.  Admit date: 09/09/2018 Discharge date: 09/10/2018   Procedures:  Procedure(s) (LRB): TOTAL KNEE ARTHROPLASTY, CORTISONE INJECTION RIGHT KNEE (Left)  Attending Physician:  Dr. Paralee Cancel   Admission Diagnoses:   Bilateral knee pain and left knee primary OA  Discharge Diagnoses:  Active Problems:   S/P left TKA   Status post total left knee replacement  Past Medical History:  Diagnosis Date  . Bursitis   . Chronic reflux esophagitis   . Complication of anesthesia   . Diverticulosis   . GERD (gastroesophageal reflux disease)   . History of kidney stones   . Hypertension   . Knee pain    right knee-seeing ortho  . Osteoarthritis   . Plantar fasciitis   . Pure hypercholesterolemia   . RLS (restless legs syndrome)   . Thyroid disease    hypothyroid    HPI:    Linda Woods, 68 y.o. female, has a history of pain and functional disability in the bilaterally knee due to arthritis and has failed non-surgical conservative treatments for greater than 12 weeks to include NSAID's and/or analgesics, corticosteriod injections, viscosupplementation injections and activity modification.  Onset of symptoms was gradual, starting 3+ years ago with gradually worsening course since that time. The patient noted prior procedures on the knee to include  arthroscopy on the left knee(s).  Patient currently rates pain in the bilateral knee(s) at 10 out of 10 with activity. Patient has worsening of pain with activity and weight bearing, pain that interferes with activities of daily living, pain with passive range of motion, crepitus and joint swelling.  Patient has evidence of periarticular osteophytes and joint space narrowing by imaging studies.  There is no active infection.  Risks, benefits and expectations were discussed with the patient.  Risks including but not limited to the risk of anesthesia,  blood clots, nerve damage, blood vessel damage, failure of the prosthesis, infection and up to and including death.  Patient understand the risks, benefits and expectations and wishes to proceed with surgery.  PCP: [name], MD   Discharged Condition: good  Hospital Course:  Patient underwent the above stated procedure on 09/09/2018. Patient tolerated the procedure well and brought to the recovery room in good condition and subsequently to the floor.  POD #1 BP: 109/79 ; Pulse: 70 ; Temp: 97.9 F (36.6 C) ; Resp: 16 Patient reports pain as mild in the left TKA and almost nothing as the cortisone injection has helped significantly in the right knee.  No reported events throughout the night.  Dr. Alvan Dame discussed the procedure, findings and expectations moving forward.  Discussed the CPM and trying to get approval for home use through the hospital.   Ready to be discharged home.  Dorsiflexion/plantar flexion intact, incision: dressing C/D/I, no cellulitis present and compartment soft.   LABS  Basename    HGB     11.7  HCT     35.3    Discharge Exam: General appearance: alert, cooperative and no distress Extremities: Homans sign is negative, no sign of DVT, no edema, redness or tenderness in the calves or thighs and no ulcers, gangrene or trophic changes  Disposition: Home with follow up in 2 weeks   Follow-up Information    Paralee Cancel, MD. Schedule an appointment as soon as possible for a visit in 2 weeks.   Specialty: Orthopedic Surgery Contact information: [address]  [place] [address] [phone]           Discharge Instructions    CPM   Complete by: As directed    Continuous passive motion machine (CPM):      Use the CPM from 4-6 hours per day.      You may increase by 10 degree per day if tolerated.  You may break it up into 2 or 3 sessions per day.   Call MD / Call 911   Complete by: As directed    If you experience chest pain or  shortness of breath, CALL 911 and be transported to the hospital emergency room.  If you develope a fever above 101 F, pus (white drainage) or increased drainage or redness at the wound, or calf pain, call your surgeon's office.   Change dressing   Complete by: As directed    Maintain surgical dressing until follow up in the clinic. If the edges start to pull up, may reinforce with tape. If the dressing is no longer working, may remove and cover with gauze and tape, but must keep the area dry and clean.  Call with any questions or concerns.   Constipation Prevention   Complete by: As directed    Drink plenty of fluids.  Prune juice may be helpful.  You may use a stool softener, such as Colace (over the counter) 100 mg twice a day.  Use MiraLax (over the counter) for constipation as needed.   Diet - low sodium heart healthy   Complete by: As directed    Discharge instructions   Complete by: As directed    Maintain surgical dressing until follow up in the clinic. If the edges start to pull up, may reinforce with tape. If the dressing is no longer working, may remove and cover with gauze and tape, but must keep the area dry and clean.  Follow up in 2 weeks at [facility]. Call with any questions or concerns.   Increase activity slowly as tolerated   Complete by: As directed    Weight bearing as tolerated with assist device (walker, cane, etc) as directed, use it as long as suggested by your surgeon or therapist, typically at least 4-6 weeks.   TED hose   Complete by: As directed    Use stockings (TED hose) for 2 weeks on both leg(s).  You may remove them at night for sleeping.      Allergies as of 09/10/2018      Reactions   Statins    Joint pain      Medication List    STOP taking these medications   celecoxib 200 MG capsule Commonly known as: CeleBREX   NEOMYCIN-POLYMYXIN-HYDROCORTISONE 1 % Soln OTIC solution Commonly known as: CORTISPORIN     TAKE these medications    aspirin 81 MG chewable tablet Commonly known as: Aspirin Childrens Chew 1 tablet (81 mg total) by mouth 2 (two) times daily. Take for 4 weeks, then resume regular dose.   docusate sodium 100 MG capsule Commonly known as: Colace Take 1 capsule (100 mg total) by mouth 2 (two) times daily.   famotidine 40 MG tablet Commonly known as: PEPCID Take 40 mg by mouth 3 (three) times a week.   ferrous sulfate 325 (65 FE) MG tablet Commonly known as: FerrouSul Take 1 tablet (325 mg total) by mouth 3 (three) times daily with meals for 14 days.   HYDROcodone-acetaminophen 7.5-325 MG tablet Commonly known as: Norco Take 1-2 tablets by  mouth every 4 (four) hours as needed for moderate pain.   LORazepam 0.5 MG tablet Commonly known as: ATIVAN Take 0.5 mg by mouth.   lovastatin 20 MG tablet Commonly known as: MEVACOR Take 20 mg by mouth at bedtime.   methocarbamol 500 MG tablet Commonly known as: Robaxin Take 1 tablet (500 mg total) by mouth every 6 (six) hours as needed for muscle spasms.   polyethylene glycol 17 g packet Commonly known as: MIRALAX / GLYCOLAX Take 17 g by mouth 2 (two) times daily.   RABEprazole 20 MG tablet Commonly known as: Aciphex Take 1 tablet (20 mg total) by mouth 2 (two) times a week.   Synthroid 112 MCG tablet Generic drug: levothyroxine Take 112 mcg by mouth daily before breakfast.   Toprol XL 50 MG 24 hr tablet Generic drug: metoprolol succinate Take 50 mg by mouth.            Discharge Care Instructions  (From admission, onward)         Start     Ordered   09/10/18 0000  Change dressing    Comments: Maintain surgical dressing until follow up in the clinic. If the edges start to pull up, may reinforce with tape. If the dressing is no longer working, may remove and cover with gauze and tape, but must keep the area dry and clean.  Call with any questions or concerns.   09/10/18 0815           Signed: [place] Pugh. Evynn Boutelle   PA-C  09/16/2018,  8:03 AM

## 2018-09-17 ENCOUNTER — Ambulatory Visit (HOSPITAL_COMMUNITY): Payer: Medicare Other

## 2018-09-17 ENCOUNTER — Other Ambulatory Visit: Payer: Self-pay

## 2018-09-17 ENCOUNTER — Encounter (HOSPITAL_COMMUNITY): Payer: Self-pay

## 2018-09-17 DIAGNOSIS — M25562 Pain in left knee: Secondary | ICD-10-CM

## 2018-09-17 DIAGNOSIS — G8918 Other acute postprocedural pain: Secondary | ICD-10-CM | POA: Diagnosis not present

## 2018-09-17 DIAGNOSIS — R29898 Other symptoms and signs involving the musculoskeletal system: Secondary | ICD-10-CM

## 2018-09-17 DIAGNOSIS — R262 Difficulty in walking, not elsewhere classified: Secondary | ICD-10-CM

## 2018-09-17 DIAGNOSIS — R600 Localized edema: Secondary | ICD-10-CM

## 2018-09-17 NOTE — Therapy (Signed)
[place], [place], [address] Phone: [phone]   Fax:  [phone]  Physical Therapy Treatment  Patient Details  Name: Linda Woods MRN: [id number] Date of Birth: [birth year] Referring Provider (PT): Isac Caddy    Encounter Date: 09/17/2018  PT End of Session - 09/17/18 0931    Visit Number  2    Number of Visits  12    Date for PT Re-Evaluation  10/14/18    Authorization Type  [employer] Medicare (progress note and G-code due visit 10)    Authorization Time Period  09/15/18 to 10/16/18    Authorization - Visit Number  2    Authorization - Number of Visits  10    PT Start Time  0915    PT Stop Time  1000    PT Time Calculation (min)  45 min    Activity Tolerance  Patient tolerated treatment well    Behavior During Therapy  [facility] for tasks assessed/performed       Past Medical History:  Diagnosis Date  . Bursitis   . Chronic reflux esophagitis   . Complication of anesthesia   . Diverticulosis   . GERD (gastroesophageal reflux disease)   . History of kidney stones   . Hypertension   . Knee pain    right knee-seeing ortho  . Osteoarthritis   . Plantar fasciitis   . Pure hypercholesterolemia   . RLS (restless legs syndrome)   . Thyroid disease    hypothyroid    Past Surgical History:  Procedure Laterality Date  . APPENDECTOMY    . CARPAL TUNNEL RELEASE Right 2011   Dr. Amedeo Plenty  . COLPORRHAPHY     posterior  . HAND SURGERY Right 2016   Nerve surgery, Dr. Amedeo Plenty  . OVARIAN CYST REMOVAL    . RECTOCELE REPAIR  2011   w/TVH and sling  . TONSILLECTOMY    . TONSILLECTOMY    . TOTAL KNEE ARTHROPLASTY Left 09/09/2018   Procedure: TOTAL KNEE ARTHROPLASTY, CORTISONE INJECTION RIGHT KNEE;  Surgeon: Paralee Cancel, MD;  Location: [facility];  Service: Orthopedics;  Laterality: Left;  70 mins  . TOTAL VAGINAL HYSTERECTOMY  10/18/2009   rectocele repair, sling  . TUBAL LIGATION Bilateral   . VARICOSE  VEIN SURGERY      There were no vitals filed for this visit.  Subjective Assessment - 09/17/18 0920    Subjective  Pt reports still taking prescription pain medication every 4-6 hours for pain control. Pt reports she has increased her reps with exercises at home without difficulty. Pt reports she occasionally takes a few steps here and there without RW, but sometimes holds onto furniture. Pt reports spouse has been performing edema management well. Pt reports back spasms due to leaning forward on RW.    Limitations  Sitting;Lifting;Standing;Walking    How long can you sit comfortably?  30 minutes    How long can you stand comfortably?  10 minutes    How long can you walk comfortably?  half a block    Patient Stated Goals  get back to PLOF and more, get down on knees    Currently in Pain?  Yes    Pain Score  4     Pain Location  Knee    Pain Orientation  Left;Lateral    Pain Descriptors / Indicators  Sharp;Aching;Dull    Pain Type  Acute pain    Pain Onset  1 to 4 weeks  ago    Pain Frequency  Constant    Aggravating Factors   activity in general    Pain Relieving Factors  ice and meds    Effect of Pain on Daily Activities  severe            [facility] Adult PT Treatment/Exercise - 09/17/18 0001      Ambulation/Gait   Ambulation/Gait  Yes    Ambulation/Gait Assistance  6: Modified independent (Device/Increase time)    Ambulation Distance (Feet)  226 Feet    Assistive device  Rolling walker    Gait Pattern  Step-to pattern;Decreased step length - right;Decreased stance time - left;Decreased stride length;Decreased dorsiflexion - left;Decreased weight shift to left;Antalgic    Gait Comments  Fluid step progression without stopping      Exercises   Exercises  Knee/Hip      Knee/Hip Exercises: Seated   Heel Slides  20 reps    Heel Slides Limitations  R over L for overpressure      Knee/Hip Exercises: Supine   Quad Sets  Left;3 sets;10 reps    Quad Sets Limitations  3 sec hold     Short Arc Quad Sets  Left;2 sets;10 reps    Short Arc Quad Sets Limitations  3 sec hold    Heel Slides  Left;15 reps    Heel Slides Limitations  3 sec hold    Heel Prop for Knee Extension  1 minute    Heel Prop for Knee Extension Limitations  towel roll under heel    Knee Extension  AROM    Knee Extension Limitations  8    Knee Flexion  AROM    Knee Flexion Limitations  80      Manual Therapy   Manual Therapy  Edema management    Manual therapy comments  completed seperately from all other interventions     Edema Management  BLE elevated on bolster, pushing fluid superior, with intermittent ankle pumps to reduce edema throughout LLE             PT Education - 09/17/18 1004    Education Details  Exercise technique, updated HEP, progressing HEP at home within tolerance to avoid severe soreness    Person(s) Educated  Patient    Methods  Explanation;Demonstration;Handout    Comprehension  Verbalized understanding;Returned demonstration       PT Short Term Goals - 09/17/18 0923      PT SHORT TERM GOAL #1   Title  Patient to demonstrate improved gait pattern with elimination of antalgic pattern and pain no more than 2/10 with gait    Time  2    Period  Weeks    Status  On-going    Target Date  09/29/18      PT SHORT TERM GOAL #2   Title  Patient to be able to maintain tandem stance for at least 20 seconds bilaterally with no UE support to show improved functional balance    Time  2    Period  Weeks    Status  On-going      PT SHORT TERM GOAL #3   Title  Patient to be compliant with HEP, to updated PRN    Time  2    Period  Weeks    Status  On-going        PT Long Term Goals - 09/17/18 6010      PT LONG TERM GOAL #1   Title  L knee ROM to  have improved to 0-110 degrees in order to improve gait mechanics and reduce feelings of pain/stiffness    Time  4    Period  Weeks    Status  On-going      PT LONG TERM GOAL #2   Title  Patient to tolerate gait at least  1576ft without increased pain to improve ability to access community    Time  4    Period  Weeks    Status  On-going      PT LONG TERM GOAL #3   Title  Patient to demonstrate MMT as being at least 4+/5 in all tested groups in order to improve function and reduce pain    Time  4    Period  Weeks    Status  On-going      PT LONG TERM GOAL #4   Title  Patient to be able to reciprocally ascend/descend flight of steps with U railing and no increase in pain to improve community access    Time  4    Period  Weeks    Status  On-going      PT LONG TERM GOAL #5   Title  Patient to be able to kneel on one knee with pain no more than 4/10 in order to improve functional mobility and allow for tasks such as gardening and cleaning    Time  4    Period  Weeks    Status  On-going            Plan - 09/17/18 0931    Clinical Impression Statement  Began treatment session with reviewing pt's goals and HEP. Adjusted pt's RW this date due to it being too low and pt complaining of back spasms from flexing forward. Gait training with improved fluid step progression able to complete stride without cessation, but continues with decreased L stance time, decreased L knee flexion in swing and decreased heel-toe gait pattern with decreased weight shift to L side. Added L knee heel prop for 1 min this date and to HEP for low load long duration stretching throughout posterior knee capsule to aid in full knee extension; pt without pain complaints after 1 minute. Continued with supine mat exercises this date with good quad contraction, but still unable to achieve full extension and reports limitations in flexion due to bandage in place. Pt's L knee AROM 8-80 deg this date. Pt denied increase in pain at EOS. Continue to progress as able.    Personal Factors and Comorbidities  Age;Fitness;Comorbidity 2    Examination-Activity Limitations  Bed Mobility;Stand;Lift;Locomotion Level;Bend;Reach  Overhead;Transfers;Carry;Sit;Sleep;Squat;Stairs    Examination-Participation Restrictions  Church;Yard Work;Cleaning;Meal Prep;Community Activity;Driving;Laundry;Shop    Stability/Clinical Decision Making  Stable/Uncomplicated    Rehab Potential  Good    PT Frequency  3x / week    PT Duration  4 weeks    PT Treatment/Interventions  ADLs/Self Care Home Management;Cryotherapy;Electrical Stimulation;Iontophoresis 4mg /ml Dexamethasone;Moist Heat;DME Instruction;Gait training;Stair training;Functional mobility training;Therapeutic activities;Therapeutic exercise;Balance training;Neuromuscular re-education;Patient/family education;Manual techniques;Compression bandaging;Scar mobilization;Passive range of motion;Dry needling;Taping    PT Next Visit Plan  Continue L knee AROM, gait pattern, hip/quad strength. Progress HEP as needed.    PT Home Exercise Plan  hospital HEP (quad set, heel slides, ankle pumps, LAQ, supine abd/add) plus focus on supine heel slides and seated flexion overpressure, walking program, edema management program; 7/29: heel prop 1 min    Consulted and Agree with Plan of Care  Patient       Patient will benefit from  skilled therapeutic intervention in order to improve the following deficits and impairments:  Abnormal gait, Decreased knowledge of use of DME, Decreased skin integrity, Increased fascial restricitons, Impaired sensation, Improper body mechanics, Pain, Decreased coordination, Decreased mobility, Increased muscle spasms, Impaired tone, Decreased activity tolerance, Decreased range of motion, Decreased strength, Hypomobility, Decreased balance, Decreased safety awareness, Difficulty walking, Increased edema  Visit Diagnosis: 1. Acute postoperative pain of left knee   2. Localized edema   3. Difficulty in walking, not elsewhere classified   4. Other symptoms and signs involving the musculoskeletal system        Problem List Patient Active Problem List   Diagnosis Date  Noted  . S/P left TKA 09/09/2018  . Status post total left knee replacement 09/09/2018  . Pain of left heel 07/03/2017  . Pain in joint of right hip 05/17/2017  . Benign essential HTN 11/17/2015  . Gastro-esophageal reflux disease without esophagitis 10/12/2014  . Hypertriglyceridemia 10/12/2014  . Hypertension 05/21/2013  . Unspecified hypothyroidism 05/21/2013  . Unspecified vitamin D deficiency 05/21/2013       Talbot Grumbling PT, DPT 09/17/18, 10:12 AM [facility] [address] [place], [place], [address] Phone: [phone]   Fax:  [phone]  Name: LILAC HOFF MRN: [id number] Date of Birth: 01/12/1951

## 2018-09-19 ENCOUNTER — Encounter (HOSPITAL_COMMUNITY): Payer: Self-pay | Admitting: Physical Therapy

## 2018-09-19 ENCOUNTER — Other Ambulatory Visit: Payer: Self-pay

## 2018-09-19 ENCOUNTER — Ambulatory Visit (HOSPITAL_COMMUNITY): Payer: Medicare Other | Admitting: Physical Therapy

## 2018-09-19 DIAGNOSIS — G8929 Other chronic pain: Secondary | ICD-10-CM

## 2018-09-19 DIAGNOSIS — R262 Difficulty in walking, not elsewhere classified: Secondary | ICD-10-CM

## 2018-09-19 DIAGNOSIS — R600 Localized edema: Secondary | ICD-10-CM

## 2018-09-19 DIAGNOSIS — R29898 Other symptoms and signs involving the musculoskeletal system: Secondary | ICD-10-CM

## 2018-09-19 DIAGNOSIS — M25562 Pain in left knee: Secondary | ICD-10-CM

## 2018-09-19 DIAGNOSIS — G8918 Other acute postprocedural pain: Secondary | ICD-10-CM | POA: Diagnosis not present

## 2018-09-19 NOTE — Therapy (Signed)
[place], [place], [address] Phone: [phone]   Fax:  [phone]  Physical Therapy Treatment  Patient Details  Name: Linda Woods MRN: [id number] Date of Birth: [birth year] Referring Provider (PT): Isac Caddy    Encounter Date: 09/19/2018  PT End of Session - 09/19/18 1003    Visit Number  3    Number of Visits  12    Date for PT Re-Evaluation  10/14/18    Authorization Type  [employer] Medicare (progress note and G-code due visit 10)    Authorization Time Period  09/15/18 to 10/16/18    Authorization - Visit Number  3    Authorization - Number of Visits  10    PT Start Time  0918    PT Stop Time  1003    PT Time Calculation (min)  45 min    Activity Tolerance  Patient tolerated treatment well    Behavior During Therapy  [facility] for tasks assessed/performed       Past Medical History:  Diagnosis Date  . Bursitis   . Chronic reflux esophagitis   . Complication of anesthesia   . Diverticulosis   . GERD (gastroesophageal reflux disease)   . History of kidney stones   . Hypertension   . Knee pain    right knee-seeing ortho  . Osteoarthritis   . Plantar fasciitis   . Pure hypercholesterolemia   . RLS (restless legs syndrome)   . Thyroid disease    hypothyroid    Past Surgical History:  Procedure Laterality Date  . APPENDECTOMY    . CARPAL TUNNEL RELEASE Right 2011   Dr. Amedeo Plenty  . COLPORRHAPHY     posterior  . HAND SURGERY Right 2016   Nerve surgery, Dr. Amedeo Plenty  . OVARIAN CYST REMOVAL    . RECTOCELE REPAIR  2011   w/TVH and sling  . TONSILLECTOMY    . TONSILLECTOMY    . TOTAL KNEE ARTHROPLASTY Left 09/09/2018   Procedure: TOTAL KNEE ARTHROPLASTY, CORTISONE INJECTION RIGHT KNEE;  Surgeon: Paralee Cancel, MD;  Location: [facility];  Service: Orthopedics;  Laterality: Left;  70 mins  . TOTAL VAGINAL HYSTERECTOMY  10/18/2009   rectocele repair, sling  . TUBAL LIGATION Bilateral   . VARICOSE  VEIN SURGERY      There were no vitals filed for this visit.  Subjective Assessment - 09/19/18 0917    Subjective  PT states that she is having quite a bit of pain,  she had a difficult time sleeping last night.    Limitations  Sitting;Lifting;Standing;Walking    How long can you sit comfortably?  30 minutes    How long can you stand comfortably?  10 minutes    How long can you walk comfortably?  half a block    Patient Stated Goals  get back to PLOF and more, get down on knees    Pain Score  5     Pain Location  Knee    Pain Orientation  Left    Pain Descriptors / Indicators  Aching    Pain Onset  1 to 4 weeks ago    Pain Frequency  Constant    Pain Relieving Factors  ice    Effect of Pain on Daily Activities  limits                       [facility] Adult PT Treatment/Exercise - 09/19/18 0001  Ambulation/Gait   Ambulation/Gait  Yes    Ambulation/Gait Assistance  6: Modified independent (Device/Increase time)    Ambulation Distance (Feet)  226 Feet    Assistive device  Rolling walker    Gait Pattern  Step-to pattern;Decreased step length - right;Decreased stance time - left;Decreased stride length;Decreased dorsiflexion - left;Decreased weight shift to left;Antalgic    Gait Comments  --      Exercises   Exercises  Knee/Hip      Knee/Hip Exercises: Stretches   Active Hamstring Stretch  Left;2 reps;30 seconds      Knee/Hip Exercises: Seated   Heel Slides  20 reps    Heel Slides Limitations  R over L for overpressure      Knee/Hip Exercises: Supine   Quad Sets  Left;3 sets;10 reps    Short Arc Quad Sets  --    Heel Slides  Left;10 reps    Heel Slides Limitations  3 sec hold    Heel Prop for Knee Extension  --    Heel Prop for Knee Extension Limitations  --    Knee Extension  AROM    Knee Extension Limitations  6    Knee Flexion  AROM    Knee Flexion Limitations  95      Knee/Hip Exercises: Prone   Hamstring Curl  10 reps    Contract/Relax to  Increase Flexion  10      Manual Therapy   Manual Therapy  Edema management    Manual therapy comments  completed seperately from all other interventions     Edema Management  total decongestive techniques.                PT Short Term Goals - 09/19/18 1011      PT SHORT TERM GOAL #1   Title  Patient to demonstrate improved gait pattern with elimination of antalgic pattern and pain no more than 2/10 with gait    Time  2    Period  Weeks    Status  On-going    Target Date  09/29/18      PT SHORT TERM GOAL #2   Title  Patient to be able to maintain tandem stance for at least 20 seconds bilaterally with no UE support to show improved functional balance    Time  2    Period  Weeks    Status  On-going      PT SHORT TERM GOAL #3   Title  Patient to be compliant with HEP, to updated PRN    Time  2    Period  Weeks    Status  On-going        PT Long Term Goals - 09/19/18 1012      PT LONG TERM GOAL #1   Title  L knee ROM to have improved to 0-110 degrees in order to improve gait mechanics and reduce feelings of pain/stiffness    Time  4    Period  Weeks    Status  On-going      PT LONG TERM GOAL #2   Title  Patient to tolerate gait at least 1549ft without increased pain to improve ability to access community    Time  4    Period  Weeks    Status  On-going      PT LONG TERM GOAL #3   Title  Patient to demonstrate MMT as being at least 4+/5 in all tested groups in order to improve function and  reduce pain    Time  4    Period  Weeks    Status  On-going      PT LONG TERM GOAL #4   Title  Patient to be able to reciprocally ascend/descend flight of steps with U railing and no increase in pain to improve community access    Time  4    Period  Weeks    Status  On-going      PT LONG TERM GOAL #5   Title  Patient to be able to kneel on one knee with pain no more than 4/10 in order to improve functional mobility and allow for tasks such as gardening and cleaning     Time  4    Period  Weeks    Status  On-going            Plan - 09/19/18 1003    Clinical Impression Statement  Pt continues to have significant swelling; states that this has always been a problem.  Pt treatment focused on decongestion of the Left LE to allow more ROM.  Added prone flexion and contract relax.  ROM 6-95 at end of treatment was 8-80.    Personal Factors and Comorbidities  Age;Fitness;Comorbidity 2    Examination-Activity Limitations  Bed Mobility;Stand;Lift;Locomotion Level;Bend;Reach Overhead;Transfers;Carry;Sit;Sleep;Squat;Stairs    Examination-Participation Restrictions  Church;Yard Work;Cleaning;Meal Prep;Community Activity;Driving;Laundry;Shop    Stability/Clinical Decision Making  Stable/Uncomplicated    Rehab Potential  Good    PT Frequency  3x / week    PT Duration  4 weeks    PT Treatment/Interventions  ADLs/Self Care Home Management;Cryotherapy;Electrical Stimulation;Iontophoresis 4mg /ml Dexamethasone;Moist Heat;DME Instruction;Gait training;Stair training;Functional mobility training;Therapeutic activities;Therapeutic exercise;Balance training;Neuromuscular re-education;Patient/family education;Manual techniques;Compression bandaging;Scar mobilization;Passive range of motion;Dry needling;Taping    PT Next Visit Plan  Continue L knee AROM, gait pattern, with these being primary focus followed by hip/quad strength. Progress HEP as needed.    PT Home Exercise Plan  hospital HEP (quad set, heel slides, ankle pumps, LAQ, supine abd/add) plus focus on supine heel slides and seated flexion overpressure, walking program, edema management program; 7/29: heel prop 1 min    Consulted and Agree with Plan of Care  Patient       Patient will benefit from skilled therapeutic intervention in order to improve the following deficits and impairments:  Abnormal gait, Decreased knowledge of use of DME, Decreased skin integrity, Increased fascial restricitons, Impaired sensation,  Improper body mechanics, Pain, Decreased coordination, Decreased mobility, Increased muscle spasms, Impaired tone, Decreased activity tolerance, Decreased range of motion, Decreased strength, Hypomobility, Decreased balance, Decreased safety awareness, Difficulty walking, Increased edema  Visit Diagnosis: 1. Difficulty in walking, not elsewhere classified   2. Other symptoms and signs involving the musculoskeletal system   3. Chronic pain of left knee   4. Localized edema        Problem List Patient Active Problem List   Diagnosis Date Noted  . S/P left TKA 09/09/2018  . Status post total left knee replacement 09/09/2018  . Pain of left heel 07/03/2017  . Pain in joint of right hip 05/17/2017  . Benign essential HTN 11/17/2015  . Gastro-esophageal reflux disease without esophagitis 10/12/2014  . Hypertriglyceridemia 10/12/2014  . Hypertension 05/21/2013  . Unspecified hypothyroidism 05/21/2013  . Unspecified vitamin D deficiency 05/21/2013  Rayetta Humphrey, PT CLT [phone] 09/19/2018, 10:13 AM  [facility] [address] [place], [place], [address] Phone: [phone]   Fax:  [phone]  Name: Linda Woods MRN:  [id number] Date of Birth: May 13, 1950

## 2018-09-22 ENCOUNTER — Encounter (HOSPITAL_COMMUNITY): Payer: Self-pay | Admitting: Physical Therapy

## 2018-09-22 ENCOUNTER — Ambulatory Visit (HOSPITAL_COMMUNITY): Payer: Medicare Other | Attending: Orthopedic Surgery | Admitting: Physical Therapy

## 2018-09-22 ENCOUNTER — Other Ambulatory Visit: Payer: Self-pay

## 2018-09-22 ENCOUNTER — Telehealth (HOSPITAL_COMMUNITY): Payer: Self-pay | Admitting: Family Medicine

## 2018-09-22 DIAGNOSIS — G8918 Other acute postprocedural pain: Secondary | ICD-10-CM | POA: Insufficient documentation

## 2018-09-22 DIAGNOSIS — G8929 Other chronic pain: Secondary | ICD-10-CM

## 2018-09-22 DIAGNOSIS — R262 Difficulty in walking, not elsewhere classified: Secondary | ICD-10-CM | POA: Diagnosis not present

## 2018-09-22 DIAGNOSIS — R29898 Other symptoms and signs involving the musculoskeletal system: Secondary | ICD-10-CM | POA: Diagnosis present

## 2018-09-22 DIAGNOSIS — M25562 Pain in left knee: Secondary | ICD-10-CM | POA: Diagnosis present

## 2018-09-22 DIAGNOSIS — R600 Localized edema: Secondary | ICD-10-CM

## 2018-09-22 NOTE — Therapy (Signed)
[place], [place], [address] Phone: [phone]   Fax:  [phone]  Physical Therapy Treatment  Patient Details  Name: Linda Woods MRN: [id number] Date of Birth: 07-08-1950 Referring Provider (PT): Isac Caddy    Encounter Date: 09/22/2018  PT End of Session - 09/22/18 1010    Visit Number  4    Number of Visits  12    Date for PT Re-Evaluation  10/14/18    Authorization Type  [employer] Medicare (progress note and G-code due visit 10)    Authorization Time Period  09/15/18 to 10/16/18    Authorization - Visit Number  4    Authorization - Number of Visits  10    PT Start Time  0913    PT Stop Time  1003    PT Time Calculation (min)  50 min    Equipment Utilized During Treatment  Gait belt    Activity Tolerance  Patient tolerated treatment well    Behavior During Therapy  [facility] for tasks assessed/performed       Past Medical History:  Diagnosis Date  . Bursitis   . Chronic reflux esophagitis   . Complication of anesthesia   . Diverticulosis   . GERD (gastroesophageal reflux disease)   . History of kidney stones   . Hypertension   . Knee pain    right knee-seeing ortho  . Osteoarthritis   . Plantar fasciitis   . Pure hypercholesterolemia   . RLS (restless legs syndrome)   . Thyroid disease    hypothyroid    Past Surgical History:  Procedure Laterality Date  . APPENDECTOMY    . CARPAL TUNNEL RELEASE Right 2011   Dr. Amedeo Plenty  . COLPORRHAPHY     posterior  . HAND SURGERY Right 2016   Nerve surgery, Dr. Amedeo Plenty  . OVARIAN CYST REMOVAL    . RECTOCELE REPAIR  2011   w/TVH and sling  . TONSILLECTOMY    . TONSILLECTOMY    . TOTAL KNEE ARTHROPLASTY Left 09/09/2018   Procedure: TOTAL KNEE ARTHROPLASTY, CORTISONE INJECTION RIGHT KNEE;  Surgeon: Paralee Cancel, MD;  Location: [facility];  Service: Orthopedics;  Laterality: Left;  70 mins  . TOTAL VAGINAL HYSTERECTOMY  10/18/2009   rectocele repair,  sling  . TUBAL LIGATION Bilateral   . VARICOSE VEIN SURGERY      There were no vitals filed for this visit.  Subjective Assessment - 09/22/18 0915    Subjective  PT states that the weekend was fine, she still has a lot of heat and pain.  She iced and elevated quite a bit over the weekend.    Pertinent History  Lt TKR    Limitations  Sitting;Lifting;Standing;Walking    How long can you sit comfortably?  30 minutes    How long can you stand comfortably?  10 minutes    How long can you walk comfortably?  half a block    Patient Stated Goals  get back to PLOF and more, get down on knees    Currently in Pain?  Yes    Pain Score  4     Pain Location  Knee    Pain Orientation  Left    Pain Descriptors / Indicators  Tightness;Throbbing;Aching    Pain Type  Acute pain    Pain Onset  1 to 4 weeks ago    Pain Frequency  Constant    Aggravating Factors   activity  Pain Relieving Factors  ice and elevation                       [facility] Adult PT Treatment/Exercise - 09/22/18 0001      Ambulation/Gait   Ambulation/Gait  --    Ambulation/Gait Assistance  --    Ambulation Distance (Feet)  --    Assistive device  --    Gait Pattern  --      Exercises   Exercises  Knee/Hip      Knee/Hip Exercises: Stretches   Active Hamstring Stretch  Left;2 reps;30 seconds      Knee/Hip Exercises: Aerobic   Stationary Bike  5'   back and forth to increase ROM seat 16     Knee/Hip Exercises: Standing   Heel Raises  Both;10 reps      Knee/Hip Exercises: Seated   Heel Slides  10 reps    Heel Slides Limitations  --      Knee/Hip Exercises: Supine   Quad Sets  Left;3 sets;10 reps    Heel Slides  Left;10 reps    Heel Slides Limitations  3 sec hold    Knee Extension  AROM    Knee Extension Limitations  6    Knee Flexion  AROM    Knee Flexion Limitations  97      Knee/Hip Exercises: Prone   Hamstring Curl  10 reps    Contract/Relax to Increase Flexion  10    Other Prone  Exercises  terminal extension x 10; terminal flexion x 10      Manual Therapy   Manual Therapy  Edema management    Manual therapy comments  completed seperately from all other interventions     Edema Management  total decongestive techniques.                PT Short Term Goals - 09/22/18 1013      PT SHORT TERM GOAL #1   Title  Patient to demonstrate improved gait pattern with elimination of antalgic pattern and pain no more than 2/10 with gait    Time  2    Period  Weeks    Status  On-going    Target Date  09/29/18      PT SHORT TERM GOAL #2   Title  Patient to be able to maintain tandem stance for at least 20 seconds bilaterally with no UE support to show improved functional balance    Time  2    Period  Weeks    Status  On-going      PT SHORT TERM GOAL #3   Title  Patient to be compliant with HEP, to updated PRN    Time  2    Period  Weeks    Status  On-going        PT Long Term Goals - 09/22/18 1013      PT LONG TERM GOAL #1   Title  L knee ROM to have improved to 0-110 degrees in order to improve gait mechanics and reduce feelings of pain/stiffness    Time  4    Period  Weeks    Status  On-going      PT LONG TERM GOAL #2   Title  Patient to tolerate gait at least 1593ft without increased pain to improve ability to access community    Time  4    Period  Weeks    Status  On-going  PT LONG TERM GOAL #3   Title  Patient to demonstrate MMT as being at least 4+/5 in all tested groups in order to improve function and reduce pain    Time  4    Period  Weeks    Status  On-going      PT LONG TERM GOAL #4   Title  Patient to be able to reciprocally ascend/descend flight of steps with U railing and no increase in pain to improve community access    Time  4    Period  Weeks    Status  On-going      PT LONG TERM GOAL #5   Title  Patient to be able to kneel on one knee with pain no more than 4/10 in order to improve functional mobility and allow for  tasks such as gardening and cleaning    Time  4    Period  Weeks    Status  On-going            Plan - 09/22/18 1011    Clinical Impression Statement  Pt ROM continues to improve.  Added new exercises to improve ROM.  Educated pt in the importance of walking heel to toe.  Heat from LE has centralized to the knee area only.  Pt will continue to benefit from skilled PT focusing on ROM at this point.    Personal Factors and Comorbidities  Age;Fitness;Comorbidity 2    Examination-Activity Limitations  Bed Mobility;Stand;Lift;Locomotion Level;Bend;Reach Overhead;Transfers;Carry;Sit;Sleep;Squat;Stairs    Examination-Participation Restrictions  Church;Yard Work;Cleaning;Meal Prep;Community Activity;Driving;Laundry;Shop    Stability/Clinical Decision Making  Stable/Uncomplicated    Rehab Potential  Good    PT Frequency  3x / week    PT Duration  4 weeks    PT Treatment/Interventions  ADLs/Self Care Home Management;Cryotherapy;Electrical Stimulation;Iontophoresis 4mg /ml Dexamethasone;Moist Heat;DME Instruction;Gait training;Stair training;Functional mobility training;Therapeutic activities;Therapeutic exercise;Balance training;Neuromuscular re-education;Patient/family education;Manual techniques;Compression bandaging;Scar mobilization;Passive range of motion;Dry needling;Taping    PT Next Visit Plan  Continue L knee AROM, gait pattern, hip/quad strength.Possible trial of gt with cane.   Progress HEP as needed.    PT Home Exercise Plan  hospital HEP (quad set, heel slides, ankle pumps, LAQ, supine abd/add) plus focus on supine heel slides and seated flexion overpressure, walking program, edema management program; 7/29: heel prop 1 min    Consulted and Agree with Plan of Care  Patient       Patient will benefit from skilled therapeutic intervention in order to improve the following deficits and impairments:  Abnormal gait, Decreased knowledge of use of DME, Decreased skin integrity, Increased  fascial restricitons, Impaired sensation, Improper body mechanics, Pain, Decreased coordination, Decreased mobility, Increased muscle spasms, Impaired tone, Decreased activity tolerance, Decreased range of motion, Decreased strength, Hypomobility, Decreased balance, Decreased safety awareness, Difficulty walking, Increased edema  Visit Diagnosis: 1. Difficulty in walking, not elsewhere classified   2. Other symptoms and signs involving the musculoskeletal system   3. Chronic pain of left knee   4. Localized edema        Problem List Patient Active Problem List   Diagnosis Date Noted  . S/P left TKA 09/09/2018  . Status post total left knee replacement 09/09/2018  . Pain of left heel 07/03/2017  . Pain in joint of right hip 05/17/2017  . Benign essential HTN 11/17/2015  . Gastro-esophageal reflux disease without esophagitis 10/12/2014  . Hypertriglyceridemia 10/12/2014  . Hypertension 05/21/2013  . Unspecified hypothyroidism 05/21/2013  . Unspecified vitamin D deficiency 05/21/2013    Caren Griffins  Joneen Caraway, [place] [phone] 09/22/2018, 10:14 AM  [place] [address] [place], [place], [address] Phone: [phone]   Fax:  [phone]  Name: Linda Woods MRN: [id number] Date of Birth: 1950-04-15

## 2018-09-22 NOTE — Telephone Encounter (Signed)
09/22/18  The schedule on 8/5 the times were off so I called and asked if the patient would move up to 115 and she was ok with that

## 2018-09-24 ENCOUNTER — Encounter (HOSPITAL_COMMUNITY): Payer: Self-pay | Admitting: Physical Therapy

## 2018-09-24 ENCOUNTER — Other Ambulatory Visit: Payer: Self-pay

## 2018-09-24 ENCOUNTER — Ambulatory Visit (HOSPITAL_COMMUNITY): Payer: Medicare Other | Admitting: Physical Therapy

## 2018-09-24 DIAGNOSIS — R29898 Other symptoms and signs involving the musculoskeletal system: Secondary | ICD-10-CM

## 2018-09-24 DIAGNOSIS — R600 Localized edema: Secondary | ICD-10-CM

## 2018-09-24 DIAGNOSIS — G8929 Other chronic pain: Secondary | ICD-10-CM

## 2018-09-24 DIAGNOSIS — R262 Difficulty in walking, not elsewhere classified: Secondary | ICD-10-CM

## 2018-09-24 NOTE — Therapy (Signed)
[place], [place], [address] Phone: [phone]   Fax:  [phone]  Physical Therapy Treatment  Patient Details  Name: Linda Woods MRN: [id number] Date of Birth: 07/11/1950 Referring Provider (PT): Isac Caddy    Encounter Date: 09/24/2018  PT End of Session - 09/24/18 1407    Visit Number  5    Number of Visits  12    Date for PT Re-Evaluation  10/14/18    Authorization Type  [employer] Medicare (progress note and G-code due visit 10)    Authorization Time Period  09/15/18 to 10/16/18    Authorization - Visit Number  5    Authorization - Number of Visits  10    PT Start Time  3267    PT Stop Time  1359    PT Time Calculation (min)  44 min    Equipment Utilized During Treatment  Gait belt    Activity Tolerance  Patient tolerated treatment well    Behavior During Therapy  [facility] for tasks assessed/performed       Past Medical History:  Diagnosis Date  . Bursitis   . Chronic reflux esophagitis   . Complication of anesthesia   . Diverticulosis   . GERD (gastroesophageal reflux disease)   . History of kidney stones   . Hypertension   . Knee pain    right knee-seeing ortho  . Osteoarthritis   . Plantar fasciitis   . Pure hypercholesterolemia   . RLS (restless legs syndrome)   . Thyroid disease    hypothyroid    Past Surgical History:  Procedure Laterality Date  . APPENDECTOMY    . CARPAL TUNNEL RELEASE Right 2011   Dr. Amedeo Plenty  . COLPORRHAPHY     posterior  . HAND SURGERY Right 2016   Nerve surgery, Dr. Amedeo Plenty  . OVARIAN CYST REMOVAL    . RECTOCELE REPAIR  2011   w/TVH and sling  . TONSILLECTOMY    . TONSILLECTOMY    . TOTAL KNEE ARTHROPLASTY Left 09/09/2018   Procedure: TOTAL KNEE ARTHROPLASTY, CORTISONE INJECTION RIGHT KNEE;  Surgeon: Paralee Cancel, MD;  Location: [facility];  Service: Orthopedics;  Laterality: Left;  70 mins  . TOTAL VAGINAL HYSTERECTOMY  10/18/2009   rectocele repair,  sling  . TUBAL LIGATION Bilateral   . VARICOSE VEIN SURGERY      There were no vitals filed for this visit.  Subjective Assessment - 09/24/18 1312    Subjective  Pt states that the MD was pleased she is trying to ice more to get rid of her swelling    Pertinent History  Lt TKR    Limitations  Sitting;Lifting;Standing;Walking    How long can you sit comfortably?  30 minutes    How long can you stand comfortably?  10 minutes    How long can you walk comfortably?  half a block    Patient Stated Goals  get back to PLOF and more, get down on knees    Pain Score  2     Pain Location  Knee    Pain Orientation  Left    Pain Descriptors / Indicators  Aching    Pain Onset  1 to 4 weeks ago    Pain Frequency  Constant    Aggravating Factors   bending    Pain Relieving Factors  ice  [facility] Adult PT Treatment/Exercise - 09/24/18 0001      Ambulation/Gait   Ambulation/Gait  Yes    Ambulation/Gait Assistance  6: Modified independent (Device/Increase time)    Ambulation Distance (Feet)  226 Feet    Assistive device  Straight cane    Gait Pattern  Step-through pattern    Stairs  Yes    Number of Stairs  4    Height of Stairs  6      Exercises   Exercises  Knee/Hip      Knee/Hip Exercises: Stretches   Passive Hamstring Stretch  Left;2 reps;30 seconds    Knee: Self-Stretch Limitations  12" step x 3     Other Knee/Hip Stretches  slant board strech 2 x 30"      Knee/Hip Exercises: Aerobic   Stationary Bike  5'   back and forth to increase ROM seat 16     Knee/Hip Exercises: Standing   Heel Raises  Both;10 reps    Knee Flexion  Left;10 reps    Terminal Knee Extension  Left;10 reps    Functional Squat  10 reps      Knee/Hip Exercises: Supine   Quad Sets  Left;10 reps    Heel Slides  Left;10 reps    Knee Extension  AROM    Knee Extension Limitations  4    Knee Flexion  AROM    Knee Flexion Limitations  102      Knee/Hip Exercises: Prone    Hamstring Curl  10 reps    Contract/Relax to Increase Flexion  10    Other Prone Exercises  terminal extension x 10; terminal flexion x 10      Manual Therapy   Manual Therapy  Edema management    Manual therapy comments  completed seperately from all other interventions     Edema Management  total decongestive techniques.              PT Education - 09/24/18 1407    Education Details  gt training including stairs with cane.    Person(s) Educated  Patient    Methods  Demonstration    Comprehension  Verbalized understanding;Returned demonstration       PT Short Term Goals - 09/24/18 1410      PT SHORT TERM GOAL #1   Title  Patient to demonstrate improved gait pattern with elimination of antalgic pattern and pain no more than 2/10 with gait    Time  2    Period  Weeks    Status  On-going    Target Date  09/29/18      PT SHORT TERM GOAL #2   Title  Patient to be able to maintain tandem stance for at least 20 seconds bilaterally with no UE support to show improved functional balance    Time  2    Period  Weeks    Status  On-going      PT SHORT TERM GOAL #3   Title  Patient to be compliant with HEP, to updated PRN    Time  2    Period  Weeks    Status  On-going        PT Long Term Goals - 09/24/18 1410      PT LONG TERM GOAL #1   Title  L knee ROM to have improved to 0-110 degrees in order to improve gait mechanics and reduce feelings of pain/stiffness    Time  4    Period  Weeks  Status  On-going      PT LONG TERM GOAL #2   Title  Patient to tolerate gait at least 1517ft without increased pain to improve ability to access community    Time  4    Period  Weeks    Status  On-going      PT LONG TERM GOAL #3   Title  Patient to demonstrate MMT as being at least 4+/5 in all tested groups in order to improve function and reduce pain    Time  4    Period  Weeks    Status  On-going      PT LONG TERM GOAL #4   Title  Patient to be able to reciprocally  ascend/descend flight of steps with U railing and no increase in pain to improve community access    Time  4    Period  Weeks    Status  On-going      PT LONG TERM GOAL #5   Title  Patient to be able to kneel on one knee with pain no more than 4/10 in order to improve functional mobility and allow for tasks such as gardening and cleaning    Time  4    Period  Weeks    Status  On-going            Plan - 09/24/18 1408    Clinical Impression Statement  Pt continues to improve in ROM.  Discontinued the walker today; pt is currently safe with a cane on level surfaces.  Pt tolerated new exercises well.    Personal Factors and Comorbidities  Age;Fitness;Comorbidity 2    Examination-Activity Limitations  Bed Mobility;Stand;Lift;Locomotion Level;Bend;Reach Overhead;Transfers;Carry;Sit;Sleep;Squat;Stairs    Examination-Participation Restrictions  Church;Yard Work;Cleaning;Meal Prep;Community Activity;Driving;Laundry;Shop    Stability/Clinical Decision Making  Stable/Uncomplicated    Rehab Potential  Good    PT Frequency  3x / week    PT Duration  4 weeks    PT Treatment/Interventions  ADLs/Self Care Home Management;Cryotherapy;Electrical Stimulation;Iontophoresis 4mg /ml Dexamethasone;Moist Heat;DME Instruction;Gait training;Stair training;Functional mobility training;Therapeutic activities;Therapeutic exercise;Balance training;Neuromuscular re-education;Patient/family education;Manual techniques;Compression bandaging;Scar mobilization;Passive range of motion;Dry needling;Taping    PT Next Visit Plan  Review stair climbing with cane.  Contiue to focus on ROM, add balance and  gentle patellar mobs.    PT Home Exercise Plan  hospital HEP (quad set, heel slides, ankle pumps, LAQ, supine abd/add) plus focus on supine heel slides and seated flexion overpressure, walking program, edema management program; 7/29: heel prop 1 min    Consulted and Agree with Plan of Care  Patient       Patient will  benefit from skilled therapeutic intervention in order to improve the following deficits and impairments:  Abnormal gait, Decreased knowledge of use of DME, Decreased skin integrity, Increased fascial restricitons, Impaired sensation, Improper body mechanics, Pain, Decreased coordination, Decreased mobility, Increased muscle spasms, Impaired tone, Decreased activity tolerance, Decreased range of motion, Decreased strength, Hypomobility, Decreased balance, Decreased safety awareness, Difficulty walking, Increased edema  Visit Diagnosis: 1. Difficulty in walking, not elsewhere classified   2. Other symptoms and signs involving the musculoskeletal system   3. Chronic pain of left knee   4. Localized edema        Problem List Patient Active Problem List   Diagnosis Date Noted  . S/P left TKA 09/09/2018  . Status post total left knee replacement 09/09/2018  . Pain of left heel 07/03/2017  . Pain in joint of right hip 05/17/2017  . Benign essential HTN  11/17/2015  . Gastro-esophageal reflux disease without esophagitis 10/12/2014  . Hypertriglyceridemia 10/12/2014  . Hypertension 05/21/2013  . Unspecified hypothyroidism 05/21/2013  . Unspecified vitamin D deficiency 05/21/2013  Rayetta Humphrey, PT CLT [phone] 09/24/2018, 2:12 PM  [facility] [address] [place], [place], [address] Phone: [phone]   Fax:  [phone]  Name: Linda Woods MRN: [id number] Date of Birth: [birth year]

## 2018-09-25 ENCOUNTER — Ambulatory Visit: Payer: 59 | Admitting: Obstetrics & Gynecology

## 2018-09-26 ENCOUNTER — Ambulatory Visit (HOSPITAL_COMMUNITY): Payer: Medicare Other

## 2018-09-26 ENCOUNTER — Encounter (HOSPITAL_COMMUNITY): Payer: Self-pay

## 2018-09-26 ENCOUNTER — Other Ambulatory Visit: Payer: Self-pay

## 2018-09-26 DIAGNOSIS — R600 Localized edema: Secondary | ICD-10-CM

## 2018-09-26 DIAGNOSIS — M25562 Pain in left knee: Secondary | ICD-10-CM

## 2018-09-26 DIAGNOSIS — R262 Difficulty in walking, not elsewhere classified: Secondary | ICD-10-CM

## 2018-09-26 DIAGNOSIS — R29898 Other symptoms and signs involving the musculoskeletal system: Secondary | ICD-10-CM

## 2018-09-26 DIAGNOSIS — G8918 Other acute postprocedural pain: Secondary | ICD-10-CM

## 2018-09-26 NOTE — Therapy (Signed)
[place], [place], [address] Phone: [phone]   Fax:  [phone]  Physical Therapy Treatment  Patient Details  Name: Linda Woods MRN: [id number] Date of Birth: 10-Jun-1950 Referring Provider (PT): Isac Caddy    Encounter Date: 09/26/2018  PT End of Session - 09/26/18 0914    Visit Number  6    Number of Visits  12    Date for PT Re-Evaluation  10/14/18    Authorization Type  [employer] Medicare (progress note and G-code due visit 10)    Authorization Time Period  09/15/18 to 10/16/18    Authorization - Visit Number  6    Authorization - Number of Visits  10    PT Start Time  0915    PT Stop Time  0955    PT Time Calculation (min)  40 min    Activity Tolerance  Patient tolerated treatment well    Behavior During Therapy  [facility] for tasks assessed/performed       Past Medical History:  Diagnosis Date  . Bursitis   . Chronic reflux esophagitis   . Complication of anesthesia   . Diverticulosis   . GERD (gastroesophageal reflux disease)   . History of kidney stones   . Hypertension   . Knee pain    right knee-seeing ortho  . Osteoarthritis   . Plantar fasciitis   . Pure hypercholesterolemia   . RLS (restless legs syndrome)   . Thyroid disease    hypothyroid    Past Surgical History:  Procedure Laterality Date  . APPENDECTOMY    . CARPAL TUNNEL RELEASE Right 2011   Dr. Amedeo Plenty  . COLPORRHAPHY     posterior  . HAND SURGERY Right 2016   Nerve surgery, Dr. Amedeo Plenty  . OVARIAN CYST REMOVAL    . RECTOCELE REPAIR  2011   w/TVH and sling  . TONSILLECTOMY    . TONSILLECTOMY    . TOTAL KNEE ARTHROPLASTY Left 09/09/2018   Procedure: TOTAL KNEE ARTHROPLASTY, CORTISONE INJECTION RIGHT KNEE;  Surgeon: Paralee Cancel, MD;  Location: [facility];  Service: Orthopedics;  Laterality: Left;  70 mins  . TOTAL VAGINAL HYSTERECTOMY  10/18/2009   rectocele repair, sling  . TUBAL LIGATION Bilateral   . VARICOSE  VEIN SURGERY      There were no vitals filed for this visit.  Subjective Assessment - 09/26/18 0914    Subjective  Pt reports she was on her feet all day yesterday and overdid it. Pt reports she is down to taking 1/2 pain pill to manage pain symptoms.    Pertinent History  Lt TKR    Limitations  Sitting;Lifting;Standing;Walking    How long can you sit comfortably?  30 minutes    How long can you stand comfortably?  10 minutes    How long can you walk comfortably?  half a block    Patient Stated Goals  get back to PLOF and more, get down on knees    Currently in Pain?  Yes    Pain Score  6     Pain Location  Knee   L heel/plantar surface   Pain Orientation  Left    Pain Descriptors / Indicators  Aching;Shooting    Pain Type  Acute pain    Pain Radiating Towards  down to mid shin    Pain Onset  1 to 4 weeks ago    Pain Frequency  Constant    Aggravating Factors  bending    Pain Relieving Factors  ice    Effect of Pain on Daily Activities  limits         [facility] Adult PT Treatment/Exercise - 09/26/18 0001      Ambulation/Gait   Ambulation/Gait  Yes    Ambulation/Gait Assistance  6: Modified independent (Device/Increase time)    Assistive device  Straight cane    Gait Pattern  Step-through pattern;Decreased stance time - left;Decreased hip/knee flexion - left    Stairs  Yes    Stair Management Technique  With cane   reciprocal ascent; LLE leading with descent   Number of Stairs  8    Height of Stairs  6    Gait Comments  Fluid step progression without stopping, verbal cues for increasing heel-toe pattern for L LE      Knee/Hip Exercises: Stretches   Active Hamstring Stretch  Left;5 reps;10 seconds    Active Hamstring Stretch Limitations  12" step    Knee: Self-Stretch to increase Flexion  Left;5 reps    Knee: Self-Stretch Limitations  12" step, 5 sec hold    Gastroc Stretch  Both;2 reps;30 seconds    Gastroc Stretch Limitations  slant board      Knee/Hip Exercises:  Supine   Quad Sets  Left;10 reps    Heel Slides  Left;15 reps    Heel Slides Limitations  3 sec hold    Knee Extension  AROM    Knee Extension Limitations  4    Knee Flexion  AROM    Knee Flexion Limitations  100      Knee/Hip Exercises: Prone   Hamstring Curl  10 reps    Contract/Relax to Increase Flexion  10    Other Prone Exercises  TKE, x10 reps      Manual Therapy   Manual Therapy  Soft tissue mobilization;Joint mobilization    Manual therapy comments  completed seperately from all other interventions     Joint Mobilization  grade I-II patellar mobs superior/inferior and medial/lateral    Soft tissue mobilization  L plantar surface to reduce palpable restrictions and reduce pain             PT Education - 09/26/18 0914    Education Details  Exercise technique, updated HEP    Person(s) Educated  Patient    Methods  Explanation;Demonstration;Handout    Comprehension  Verbalized understanding;Returned demonstration       PT Short Term Goals - 09/24/18 1410      PT SHORT TERM GOAL #1   Title  Patient to demonstrate improved gait pattern with elimination of antalgic pattern and pain no more than 2/10 with gait    Time  2    Period  Weeks    Status  On-going    Target Date  09/29/18      PT SHORT TERM GOAL #2   Title  Patient to be able to maintain tandem stance for at least 20 seconds bilaterally with no UE support to show improved functional balance    Time  2    Period  Weeks    Status  On-going      PT SHORT TERM GOAL #3   Title  Patient to be compliant with HEP, to updated PRN    Time  2    Period  Weeks    Status  On-going        PT Long Term Goals - 09/24/18 1410      PT  LONG TERM GOAL #1   Title  L knee ROM to have improved to 0-110 degrees in order to improve gait mechanics and reduce feelings of pain/stiffness    Time  4    Period  Weeks    Status  On-going      PT LONG TERM GOAL #2   Title  Patient to tolerate gait at least 1513ft without  increased pain to improve ability to access community    Time  4    Period  Weeks    Status  On-going      PT LONG TERM GOAL #3   Title  Patient to demonstrate MMT as being at least 4+/5 in all tested groups in order to improve function and reduce pain    Time  4    Period  Weeks    Status  On-going      PT LONG TERM GOAL #4   Title  Patient to be able to reciprocally ascend/descend flight of steps with U railing and no increase in pain to improve community access    Time  4    Period  Weeks    Status  On-going      PT LONG TERM GOAL #5   Title  Patient to be able to kneel on one knee with pain no more than 4/10 in order to improve functional mobility and allow for tasks such as gardening and cleaning    Time  4    Period  Weeks    Status  On-going            Plan - 09/26/18 0915    Clinical Impression Statement  Pt with increased pain today in L knee and L heel due to increased time on feet yesterday. Initiated treatment session with manual STM to plantar surface with big toe extension stretch to reduce palpable restrictions throughout plantar fascia and improve pain. Also, performed grade gentle patellar mobs in all directions to improve mobility and reduce pain. Pt reports decrease in 6/10 pain after manual and able to continue therapy session. Pt continues to ambulate with SPC using step through, fluid pattern, but requires verbal cues for heel-toe to improve hip, knee and ankle AROM throughout gait cycle. Pt able to ascend stairs reciprocally with SPC, but uses 2 feet per step leading with LLE for descent. Added stretching at home for HS in sitting and calf in standing to further reduce any limitations around the knee joint to achieve full AROM. Continue to progress as able.    Personal Factors and Comorbidities  Age;Fitness;Comorbidity 2    Examination-Activity Limitations  Bed Mobility;Stand;Lift;Locomotion Level;Bend;Reach Overhead;Transfers;Carry;Sit;Sleep;Squat;Stairs     Examination-Participation Restrictions  Church;Yard Work;Cleaning;Meal Prep;Community Activity;Driving;Laundry;Shop    Stability/Clinical Decision Making  Stable/Uncomplicated    Rehab Potential  Good    PT Frequency  3x / week    PT Duration  4 weeks    PT Treatment/Interventions  ADLs/Self Care Home Management;Cryotherapy;Electrical Stimulation;Iontophoresis 4mg /ml Dexamethasone;Moist Heat;DME Instruction;Gait training;Stair training;Functional mobility training;Therapeutic activities;Therapeutic exercise;Balance training;Neuromuscular re-education;Patient/family education;Manual techniques;Compression bandaging;Scar mobilization;Passive range of motion;Dry needling;Taping    PT Next Visit Plan  Continue gait and stair training with SPC. Contiue to focus on ROM, quad strength, add balance and gentle patellar mobs.    PT Home Exercise Plan  hospital HEP (quad set, heel slides, ankle pumps, LAQ, supine abd/add) plus focus on supine heel slides and seated flexion overpressure, walking program, edema management program; 7/29: heel prop 1 min; 8/7: LLE HS seated stretch and  LLE calf standing stretch    Consulted and Agree with Plan of Care  Patient       Patient will benefit from skilled therapeutic intervention in order to improve the following deficits and impairments:  Abnormal gait, Decreased knowledge of use of DME, Decreased skin integrity, Increased fascial restricitons, Impaired sensation, Improper body mechanics, Pain, Decreased coordination, Decreased mobility, Increased muscle spasms, Impaired tone, Decreased activity tolerance, Decreased range of motion, Decreased strength, Hypomobility, Decreased balance, Decreased safety awareness, Difficulty walking, Increased edema  Visit Diagnosis: 1. Acute postoperative pain of left knee   2. Localized edema   3. Difficulty in walking, not elsewhere classified   4. Other symptoms and signs involving the musculoskeletal system        Problem  List Patient Active Problem List   Diagnosis Date Noted  . S/P left TKA 09/09/2018  . Status post total left knee replacement 09/09/2018  . Pain of left heel 07/03/2017  . Pain in joint of right hip 05/17/2017  . Benign essential HTN 11/17/2015  . Gastro-esophageal reflux disease without esophagitis 10/12/2014  . Hypertriglyceridemia 10/12/2014  . Hypertension 05/21/2013  . Unspecified hypothyroidism 05/21/2013  . Unspecified vitamin D deficiency 05/21/2013      Talbot Grumbling PT, DPT 09/26/18, 10:20 AM [place] [address] [place], [place], [address] Phone: [phone]   Fax:  [phone]  Name: TYSHIKA BALDRIDGE MRN: [id number] Date of Birth: 02/02/1951

## 2018-09-29 ENCOUNTER — Encounter (HOSPITAL_COMMUNITY): Payer: Self-pay

## 2018-09-29 ENCOUNTER — Ambulatory Visit (HOSPITAL_COMMUNITY): Payer: Medicare Other

## 2018-09-29 ENCOUNTER — Other Ambulatory Visit: Payer: Self-pay

## 2018-09-29 ENCOUNTER — Other Ambulatory Visit: Payer: Self-pay | Admitting: Obstetrics & Gynecology

## 2018-09-29 ENCOUNTER — Encounter (HOSPITAL_COMMUNITY): Payer: Medicare Other | Admitting: Physical Therapy

## 2018-09-29 DIAGNOSIS — R600 Localized edema: Secondary | ICD-10-CM

## 2018-09-29 DIAGNOSIS — G8918 Other acute postprocedural pain: Secondary | ICD-10-CM

## 2018-09-29 DIAGNOSIS — R29898 Other symptoms and signs involving the musculoskeletal system: Secondary | ICD-10-CM

## 2018-09-29 DIAGNOSIS — Z1231 Encounter for screening mammogram for malignant neoplasm of breast: Secondary | ICD-10-CM

## 2018-09-29 DIAGNOSIS — M25562 Pain in left knee: Secondary | ICD-10-CM

## 2018-09-29 DIAGNOSIS — R262 Difficulty in walking, not elsewhere classified: Secondary | ICD-10-CM | POA: Diagnosis not present

## 2018-09-29 DIAGNOSIS — K648 Other hemorrhoids: Secondary | ICD-10-CM | POA: Insufficient documentation

## 2018-09-29 DIAGNOSIS — K5790 Diverticulosis of intestine, part unspecified, without perforation or abscess without bleeding: Secondary | ICD-10-CM | POA: Insufficient documentation

## 2018-09-29 DIAGNOSIS — Z86010 Personal history of colonic polyps: Secondary | ICD-10-CM | POA: Insufficient documentation

## 2018-09-29 NOTE — Therapy (Signed)
[place], [place], [address] Phone: [phone]   Fax:  [phone]  Physical Therapy Treatment  Patient Details  Name: Linda Woods MRN: [id number] Date of Birth: May 21, 1950 Referring Provider (PT): Isac Caddy    Encounter Date: 09/29/2018  PT End of Session - 09/29/18 1113    Visit Number  7    Number of Visits  12    Date for PT Re-Evaluation  10/14/18    Authorization Type  [employer] Medicare (progress note and G-code due visit 10)    Authorization Time Period  09/15/18 to 10/16/18    Authorization - Visit Number  7    Authorization - Number of Visits  10    PT Start Time  1115    PT Stop Time  1200    PT Time Calculation (min)  45 min    Activity Tolerance  Patient tolerated treatment well    Behavior During Therapy  [facility] for tasks assessed/performed       Past Medical History:  Diagnosis Date  . Bursitis   . Chronic reflux esophagitis   . Complication of anesthesia   . Diverticulosis   . GERD (gastroesophageal reflux disease)   . History of kidney stones   . Hypertension   . Knee pain    right knee-seeing ortho  . Osteoarthritis   . Plantar fasciitis   . Pure hypercholesterolemia   . RLS (restless legs syndrome)   . Thyroid disease    hypothyroid    Past Surgical History:  Procedure Laterality Date  . APPENDECTOMY    . CARPAL TUNNEL RELEASE Right 2011   Dr. Amedeo Plenty  . COLPORRHAPHY     posterior  . HAND SURGERY Right 2016   Nerve surgery, Dr. Amedeo Plenty  . OVARIAN CYST REMOVAL    . RECTOCELE REPAIR  2011   w/TVH and sling  . TONSILLECTOMY    . TONSILLECTOMY    . TOTAL KNEE ARTHROPLASTY Left 09/09/2018   Procedure: TOTAL KNEE ARTHROPLASTY, CORTISONE INJECTION RIGHT KNEE;  Surgeon: Paralee Cancel, MD;  Location: [facility];  Service: Orthopedics;  Laterality: Left;  70 mins  . TOTAL VAGINAL HYSTERECTOMY  10/18/2009   rectocele repair, sling  . TUBAL LIGATION Bilateral   . VARICOSE  VEIN SURGERY      There were no vitals filed for this visit.  Subjective Assessment - 09/29/18 1113    Subjective  Pt presents with L plantar fascia brace from podiatrist on and c/o pain with that all weekend long. Pt reports she is continuing her exercises at home and spouse performs edema massage.    Pertinent History  Lt TKR    Limitations  Sitting;Lifting;Standing;Walking    How long can you sit comfortably?  30 minutes    How long can you stand comfortably?  10 minutes    How long can you walk comfortably?  half a block    Patient Stated Goals  get back to PLOF and more, get down on knees    Currently in Pain?  Yes    Pain Score  2    5/10 L plantar fascia pain   Pain Location  Knee    Pain Orientation  Left    Pain Descriptors / Indicators  Aching    Pain Type  Acute pain    Pain Onset  1 to 4 weeks ago    Aggravating Factors   bending    Pain Relieving Factors  ice  Effect of Pain on Daily Activities  limits            [facility] Adult PT Treatment/Exercise - 09/29/18 0001      Ambulation/Gait   Ambulation/Gait  Yes    Ambulation/Gait Assistance  6: Modified independent (Device/Increase time)    Assistive device  Straight cane;None    Gait Pattern  Step-through pattern;Decreased stance time - left;Decreased hip/knee flexion - left    Gait Comments  limited secondary to L plantar fascia pain      Knee/Hip Exercises: Stretches   Active Hamstring Stretch  Left    Active Hamstring Stretch Limitations  12" step, 10 reps x5 sec holds    Knee: Self-Stretch to increase Flexion  Left    Knee: Self-Stretch Limitations  12" step, 10 reps x5 sec holds    Gastroc Stretch  Both;3 reps;30 seconds    Gastroc Stretch Limitations  slant board      Knee/Hip Exercises: Aerobic   Stationary Bike  5'   seat 15 then 14, full revolutions and back/forth     Knee/Hip Exercises: Standing   Terminal Knee Extension  Left;2 sets;10 reps    Theraband Level (Terminal Knee Extension)  Level  2 (Red)      Knee/Hip Exercises: Supine   Quad Sets  Left;15 reps    Heel Slides  Left;15 reps    Heel Slides Limitations  3 sec hold    Knee Extension  AROM    Knee Extension Limitations  3    Knee Flexion  AROM    Knee Flexion Limitations  108    Other Supine Knee/Hip Exercises  supine heel prop, 2x1 min      Manual Therapy   Manual Therapy  Edema management;Joint mobilization    Manual therapy comments  completed seperately from all other interventions     Edema Management  light pressure from distal to proximal to encourage fluid return and decrease swelling    Joint Mobilization  grade I-II patellar mobs superior/inferior and medial/lateral        PT Education - 09/29/18 1113    Education Details  Exercise technique, continue HEP    Person(s) Educated  Patient    Methods  Explanation    Comprehension  Verbalized understanding       PT Short Term Goals - 09/24/18 1410      PT SHORT TERM GOAL #1   Title  Patient to demonstrate improved gait pattern with elimination of antalgic pattern and pain no more than 2/10 with gait    Time  2    Period  Weeks    Status  On-going    Target Date  09/29/18      PT SHORT TERM GOAL #2   Title  Patient to be able to maintain tandem stance for at least 20 seconds bilaterally with no UE support to show improved functional balance    Time  2    Period  Weeks    Status  On-going      PT SHORT TERM GOAL #3   Title  Patient to be compliant with HEP, to updated PRN    Time  2    Period  Weeks    Status  On-going        PT Long Term Goals - 09/24/18 1410      PT LONG TERM GOAL #1   Title  L knee ROM to have improved to 0-110 degrees in order to improve gait mechanics and  reduce feelings of pain/stiffness    Time  4    Period  Weeks    Status  On-going      PT LONG TERM GOAL #2   Title  Patient to tolerate gait at least 1529ft without increased pain to improve ability to access community    Time  4    Period  Weeks    Status   On-going      PT LONG TERM GOAL #3   Title  Patient to demonstrate MMT as being at least 4+/5 in all tested groups in order to improve function and reduce pain    Time  4    Period  Weeks    Status  On-going      PT LONG TERM GOAL #4   Title  Patient to be able to reciprocally ascend/descend flight of steps with U railing and no increase in pain to improve community access    Time  4    Period  Weeks    Status  On-going      PT LONG TERM GOAL #5   Title  Patient to be able to kneel on one knee with pain no more than 4/10 in order to improve functional mobility and allow for tasks such as gardening and cleaning    Time  4    Period  Weeks    Status  On-going            Plan - 09/29/18 1114    Clinical Impression Statement  Continued with pt's established POC for strengthening and improving L knee AROM. Pt improving with bicycle this date, decreased seat distance from pedals and able to complete full revolutions versus back/forth for added AROM. Pt progressing with gait training, able to ambulate without SPC this date, but due to L plantar fasciitis pain, only able to tolerate 100 ft before needing to return to [facility] for added stability and pressure relief. Pt's L knee AROM this date 3-108 deg. Limited to weight-bearing strengthening positions secondary to plantar fasciitis pain this date. Continue to progress as able.    Personal Factors and Comorbidities  Age;Fitness;Comorbidity 2    Examination-Activity Limitations  Bed Mobility;Stand;Lift;Locomotion Level;Bend;Reach Overhead;Transfers;Carry;Sit;Sleep;Squat;Stairs    Examination-Participation Restrictions  Church;Yard Work;Cleaning;Meal Prep;Community Activity;Driving;Laundry;Shop    Stability/Clinical Decision Making  Stable/Uncomplicated    Rehab Potential  Good    PT Frequency  3x / week    PT Duration  4 weeks    PT Treatment/Interventions  ADLs/Self Care Home Management;Cryotherapy;Electrical Stimulation;Iontophoresis 4mg /ml  Dexamethasone;Moist Heat;DME Instruction;Gait training;Stair training;Functional mobility training;Therapeutic activities;Therapeutic exercise;Balance training;Neuromuscular re-education;Patient/family education;Manual techniques;Compression bandaging;Scar mobilization;Passive range of motion;Dry needling;Taping    PT Next Visit Plan  Continue gait and stair training with SPC. Contiue to focus on ROM, quad strength, add balance and gentle patellar mobs.    PT Home Exercise Plan  hospital HEP (quad set, heel slides, ankle pumps, LAQ, supine abd/add) plus focus on supine heel slides and seated flexion overpressure, walking program, edema management program; 7/29: heel prop 1 min; 8/7: LLE HS seated stretch and LLE calf standing stretch    Consulted and Agree with Plan of Care  Patient       Patient will benefit from skilled therapeutic intervention in order to improve the following deficits and impairments:  Abnormal gait, Decreased knowledge of use of DME, Decreased skin integrity, Increased fascial restricitons, Impaired sensation, Improper body mechanics, Pain, Decreased coordination, Decreased mobility, Increased muscle spasms, Impaired tone, Decreased activity tolerance, Decreased range of motion,  Decreased strength, Hypomobility, Decreased balance, Decreased safety awareness, Difficulty walking, Increased edema  Visit Diagnosis: 1. Acute postoperative pain of left knee   2. Localized edema   3. Difficulty in walking, not elsewhere classified   4. Other symptoms and signs involving the musculoskeletal system        Problem List Patient Active Problem List   Diagnosis Date Noted  . S/P left TKA 09/09/2018  . Status post total left knee replacement 09/09/2018  . Pain of left heel 07/03/2017  . Pain in joint of right hip 05/17/2017  . Benign essential HTN 11/17/2015  . Gastro-esophageal reflux disease without esophagitis 10/12/2014  . Hypertriglyceridemia 10/12/2014  . Hypertension  05/21/2013  . Unspecified hypothyroidism 05/21/2013  . Unspecified vitamin D deficiency 05/21/2013      Talbot Grumbling PT, DPT 09/29/18, 12:20 PM [place] [address] [place], [place], [address] Phone: [phone]   Fax:  [phone]  Name: Linda Woods MRN: [id number] Date of Birth: [birth year]

## 2018-10-01 ENCOUNTER — Other Ambulatory Visit: Payer: Self-pay

## 2018-10-01 ENCOUNTER — Ambulatory Visit (HOSPITAL_COMMUNITY): Payer: Medicare Other | Admitting: Physical Therapy

## 2018-10-01 ENCOUNTER — Encounter (HOSPITAL_COMMUNITY): Payer: Self-pay | Admitting: Physical Therapy

## 2018-10-01 ENCOUNTER — Encounter (HOSPITAL_COMMUNITY): Payer: Medicare Other | Admitting: Physical Therapy

## 2018-10-01 DIAGNOSIS — R262 Difficulty in walking, not elsewhere classified: Secondary | ICD-10-CM | POA: Diagnosis not present

## 2018-10-01 DIAGNOSIS — M25562 Pain in left knee: Secondary | ICD-10-CM

## 2018-10-01 DIAGNOSIS — G8918 Other acute postprocedural pain: Secondary | ICD-10-CM

## 2018-10-01 DIAGNOSIS — R600 Localized edema: Secondary | ICD-10-CM

## 2018-10-01 DIAGNOSIS — R29898 Other symptoms and signs involving the musculoskeletal system: Secondary | ICD-10-CM

## 2018-10-01 NOTE — Patient Instructions (Signed)
   Gastroc Stretch  Place foot against wall. Keep knee straight, but not locked. Lean forward until you feel a comfortable stretch.   Hold for 30 seconds, then switch feet.  Repeat 2-3 times each foot, 1-2 times per day.    Plantar Fascia Stretch  Stand on a raised step. Place the involved foot with the ball and toes right on the edge. Bend both knees and drop the heel that is over the end of the step.  You should feel a deep stretch in the arch of your foot.  Hold for 30 seconds, then relax.  Repeat 2-3 times each foot, 1-2 times per day.

## 2018-10-01 NOTE — Therapy (Signed)
[place], [place], [address] Phone: [phone]   Fax:  [phone]  Physical Therapy Treatment  Patient Details  Name: Linda Woods MRN: [id number] Date of Birth: 02/11/1951 Referring Provider (PT): Linda Woods    Encounter Date: 10/01/2018  PT End of Session - 10/01/18 1005    Visit Number  8    Number of Visits  12    Date for PT Re-Evaluation  10/14/18    Authorization Type  BC[employer] Medicare (progress note and G-code due visit 10)    Authorization Time Period  09/15/18 to 10/16/18    Authorization - Visit Number  8    Authorization - Number of Visits  10    PT Start Time  0916    PT Stop Time  0958    PT Time Calculation (min)  42 min    Activity Tolerance  Patient tolerated treatment well    Behavior During Therapy  [place] for tasks assessed/performed       Past Medical History:  Diagnosis Date  . Bursitis   . Chronic reflux esophagitis   . Complication of anesthesia   . Diverticulosis   . GERD (gastroesophageal reflux disease)   . History of kidney stones   . Hypertension   . Knee pain    right knee-seeing ortho  . Osteoarthritis   . Plantar fasciitis   . Pure hypercholesterolemia   . RLS (restless legs syndrome)   . Thyroid disease    hypothyroid    Past Surgical History:  Procedure Laterality Date  . APPENDECTOMY    . CARPAL TUNNEL RELEASE Right 2011   Dr. Amedeo [place]  . COLPORRHAPHY     posterior  . HAND SURGERY Right 2016   Nerve surgery, Dr. Amedeo Woods  . OVARIAN CYST REMOVAL    . RECTOCELE REPAIR  2011   w/TVH and sling  . TONSILLECTOMY    . TONSILLECTOMY    . TOTAL KNEE ARTHROPLASTY Left 09/09/2018   Procedure: TOTAL KNEE ARTHROPLASTY, CORTISONE INJECTION RIGHT KNEE;  Surgeon: Linda Cancel, MD;  Location: [facility];  Service: Orthopedics;  Laterality: Left;  70 mins  . TOTAL VAGINAL HYSTERECTOMY  10/18/2009   rectocele repair, sling  . TUBAL LIGATION Bilateral   . VARICOSE  VEIN SURGERY      There were no vitals filed for this visit.  Subjective Assessment - 10/01/18 0918    Subjective  I was able to do my stairs reciprocally, it was slow but I could do it! I'm tired today as I went to [place]. I don't have the plantar fasciitis brace today.    Pertinent History  Lt TKR    Patient Stated Goals  get back to PLOF and more, get down on knees    Currently in Pain?  Yes    Pain Score  2    2/10 in knee, 5-6/10 in foot   Pain Location  --   knee and left foot   Pain Orientation  Left    Pain Descriptors / Indicators  Aching    Pain Type  Surgical pain         [facility] PT Assessment - 10/01/18 0001      AROM   Left Knee Extension  7    Left Knee Flexion  106                   [facility] Adult PT Treatment/Exercise - 10/01/18 0001  Knee/Hip Exercises: Stretches   Press photographer  Both;3 reps;30 seconds    Gastroc Stretch Limitations  slant board     Other Knee/Hip Stretches  plantar fascia stretch 1x30 seconds on steps       Knee/Hip Exercises: Standing   Lateral Step Up  Left;1 set;10 reps;Hand Hold: 1;Step Height: 2"    Forward Step Up  Left;1 set;15 reps;Hand Hold: 1;Step Height: 2"    Step Down  Left;1 set;10 reps;Hand Hold: 2;Step Height: 4"      Knee/Hip Exercises: Supine   Quad Sets  Left;1 set;15 reps    Quad Sets Limitations  5 second holds     Heel Slides  Left;1 set;15 reps    Heel Slides Limitations  5 second holds     Other Supine Knee/Hip Exercises  knee extension overpressure 1x10 3 scond holds       Knee/Hip Exercises: Prone   Other Prone Exercises  knee flexion overpressure 1x10 3 second holds           Balance Exercises - 10/01/18 0951      Balance Exercises: Standing   Standing Eyes Opened  Narrow base of support (BOS);Foam/compliant surface;3 reps;30 secs   head turns, eyes closed    Tandem Stance  Eyes open;Foam/compliant surface;3 reps;15 secs        PT Education - 10/01/18  1005    Education Details  gastroc and plantar fascia stretches, middle ground of doing enough for recovery vs too much    Person(s) Educated  Patient    Methods  Explanation;Demonstration;Handout    Comprehension  Verbalized understanding;Need further instruction;Returned demonstration       PT Short Term Goals - 09/24/18 1410      PT SHORT TERM GOAL #1   Title  Patient to demonstrate improved gait pattern with elimination of antalgic pattern and pain no more than 2/10 with gait    Time  2    Period  Weeks    Status  On-going    Target Date  09/29/18      PT SHORT TERM GOAL #2   Title  Patient to be able to maintain tandem stance for at least 20 seconds bilaterally with no UE support to show improved functional balance    Time  2    Period  Weeks    Status  On-going      PT SHORT TERM GOAL #3   Title  Patient to be compliant with HEP, to updated PRN    Time  2    Period  Weeks    Status  On-going        PT Long Term Goals - 09/24/18 1410      PT LONG TERM GOAL #1   Title  L knee ROM to have improved to 0-110 degrees in order to improve gait mechanics and reduce feelings of pain/stiffness    Time  4    Period  Weeks    Status  On-going      PT LONG TERM GOAL #2   Title  Patient to tolerate gait at least 1571ft without increased pain to improve ability to access community    Time  4    Period  Weeks    Status  On-going      PT LONG TERM GOAL #3   Title  Patient to demonstrate MMT as being at least 4+/5 in all tested groups in order to improve function and reduce pain    Time  4    Period  Weeks    Status  On-going      PT LONG TERM GOAL #4   Title  Patient to be able to reciprocally ascend/descend flight of steps with U railing and no increase in pain to improve community access    Time  4    Period  Weeks    Status  On-going      PT LONG TERM GOAL #5   Title  Patient to be able to kneel on one knee with pain no more than 4/10 in order to improve functional  mobility and allow for tasks such as gardening and cleaning    Time  4    Period  Weeks    Status  On-going            Plan - 10/01/18 1006    Clinical Impression Statement  Linda [place] arrives today reporting she overdid it a bit the other day with lots of walking and her knee is feeling much stiffer today; her foot is feeling OK, however did continue to limit standing activities to avoid inflammation in plantar fascia region. Continued working on ROM due to increased knee stiffness following a long day up on her feet yesterday, also incorporated low grade strength as tolerated. Discussed and demonstrated multiple gastroc and plantar fascia stretches for home management of foot pain and to improve tolerance to standing based tasks.    Personal Factors and Comorbidities  Age;Fitness;Comorbidity 2    Examination-Activity Limitations  Bed Mobility;Stand;Lift;Locomotion Level;Bend;Reach Overhead;Transfers;Carry;Sit;Sleep;Squat;Stairs    Stability/Clinical Decision Making  Stable/Uncomplicated    Clinical Decision Making  Low    Rehab Potential  Good    PT Frequency  3x / week    PT Duration  4 weeks    PT Treatment/Interventions  ADLs/Self Care Home Management;Cryotherapy;Electrical Stimulation;Iontophoresis 4mg /ml Dexamethasone;Moist Heat;DME Instruction;Gait training;Stair training;Functional mobility training;Therapeutic activities;Therapeutic exercise;Balance training;Neuromuscular re-education;Patient/family education;Manual techniques;Compression bandaging;Scar mobilization;Passive range of motion;Dry needling;Taping    PT Next Visit Plan  Continue gait and stair training with SPC. Contiue to focus on ROM, quad strength, add balance and gentle patellar mobs.    PT Home Exercise Plan  hospital HEP (quad set, heel slides, ankle pumps, LAQ, supine abd/add) plus focus on supine heel slides and seated flexion overpressure, walking program, edema management program; 7/29: heel prop 1 min; 8/7: LLE  HS seated stretch and LLE calf standing stretch    Consulted and Agree with Plan of Care  Patient       Patient will benefit from skilled therapeutic intervention in order to improve the following deficits and impairments:  Abnormal gait, Decreased knowledge of use of DME, Decreased skin integrity, Increased fascial restricitons, Impaired sensation, Improper body mechanics, Pain, Decreased coordination, Decreased mobility, Increased muscle spasms, Impaired tone, Decreased activity tolerance, Decreased range of motion, Decreased strength, Hypomobility, Decreased balance, Decreased safety awareness, Difficulty walking, Increased edema  Visit Diagnosis: 1. Acute postoperative pain of left knee   2. Localized edema   3. Difficulty in walking, not elsewhere classified   4. Other symptoms and signs involving the musculoskeletal system        Problem List Patient Active Problem List   Diagnosis Date Noted  . S/P left TKA 09/09/2018  . Status post total left knee replacement 09/09/2018  . Pain of left heel 07/03/2017  . Pain in joint of right hip 05/17/2017  . Benign essential HTN 11/17/2015  . Gastro-esophageal reflux disease without esophagitis 10/12/2014  . Hypertriglyceridemia 10/12/2014  .  Hypertension 05/21/2013  . Unspecified hypothyroidism 05/21/2013  . Unspecified vitamin D deficiency 05/21/2013    Deniece Ree PT, DPT, CBIS  Supplemental Physical Therapist [facility]    Pager [phone] Acute Rehab Office [place] [address] [place], [place], [address] Phone: [phone]   Fax:  [phone]  Name: Linda Woods MRN: [id number] Date of Birth: October 06, 1950

## 2018-10-03 ENCOUNTER — Ambulatory Visit (HOSPITAL_COMMUNITY): Payer: Medicare Other

## 2018-10-03 ENCOUNTER — Other Ambulatory Visit: Payer: Self-pay

## 2018-10-03 ENCOUNTER — Encounter (HOSPITAL_COMMUNITY): Payer: Self-pay

## 2018-10-03 ENCOUNTER — Encounter (HOSPITAL_COMMUNITY): Payer: Medicare Other

## 2018-10-03 DIAGNOSIS — G8918 Other acute postprocedural pain: Secondary | ICD-10-CM

## 2018-10-03 DIAGNOSIS — R29898 Other symptoms and signs involving the musculoskeletal system: Secondary | ICD-10-CM

## 2018-10-03 DIAGNOSIS — R600 Localized edema: Secondary | ICD-10-CM

## 2018-10-03 DIAGNOSIS — R262 Difficulty in walking, not elsewhere classified: Secondary | ICD-10-CM

## 2018-10-03 DIAGNOSIS — M25562 Pain in left knee: Secondary | ICD-10-CM

## 2018-10-03 NOTE — Patient Instructions (Addendum)
Short Arc Knee Extension    Place bolster under left knee so it is bent slightly. Squeeze pelvic floor and hold. Straighten knee.  Hold for 3 seconds.  Repeat 10 times. Do 2 times a day. Repeat with other leg.  Copyright  VHI. All rights reserved.   Hamstring: Towel Stretch (Supine)    Lie on back. Loop dog leash around left foot, hip and knee at 90. Straighten knee and pull foot toward body. Hold 30 seconds. Relax. Repeat 3 times.   Copyright  VHI. All rights reserved.   Single Leg Balance: Eyes Open    Stand on right leg with eyes open. Hold 60 seconds. 3 reps 2 times per day.  http://ggbe.exer.us/5   Copyright  VHI. All rights reserved.

## 2018-10-03 NOTE — Therapy (Addendum)
[place], [place], [address] Phone: [phone]   Fax:  [phone]  Physical Therapy Treatment  Patient Details  Name: Linda Woods MRN: [id number] Date of Birth: 11-10-1950 Referring Provider (PT): Isac Caddy    Encounter Date: 10/03/2018  PT End of Session - 10/03/18 1625    Visit Number  9    Number of Visits  12    Date for PT Re-Evaluation  10/14/18    Authorization Type  [employer] Medicare (progress note and G-code due visit 10)    Authorization Time Period  09/15/18 to 10/16/18    Authorization - Visit Number  9    Authorization - Number of Visits  10    PT Start Time  9983   5' on bike, not included with charges   PT Stop Time  1704    PT Time Calculation (min)  48 min    Activity Tolerance  Patient tolerated treatment well    Behavior During Therapy  [facility] for tasks assessed/performed       Past Medical History:  Diagnosis Date  . Bursitis   . Chronic reflux esophagitis   . Complication of anesthesia   . Diverticulosis   . GERD (gastroesophageal reflux disease)   . History of kidney stones   . Hypertension   . Knee pain    right knee-seeing ortho  . Osteoarthritis   . Plantar fasciitis   . Pure hypercholesterolemia   . RLS (restless legs syndrome)   . Thyroid disease    hypothyroid    Past Surgical History:  Procedure Laterality Date  . APPENDECTOMY    . CARPAL TUNNEL RELEASE Right 2011   Dr. Amedeo Plenty  . COLPORRHAPHY     posterior  . HAND SURGERY Right 2016   Nerve surgery, Dr. Amedeo Plenty  . OVARIAN CYST REMOVAL    . RECTOCELE REPAIR  2011   w/TVH and sling  . TONSILLECTOMY    . TONSILLECTOMY    . TOTAL KNEE ARTHROPLASTY Left 09/09/2018   Procedure: TOTAL KNEE ARTHROPLASTY, CORTISONE INJECTION RIGHT KNEE;  Surgeon: Paralee Cancel, MD;  Location: [facility];  Service: Orthopedics;  Laterality: Left;  70 mins  . TOTAL VAGINAL HYSTERECTOMY  10/18/2009   rectocele repair, sling  .  TUBAL LIGATION Bilateral   . VARICOSE VEIN SURGERY      There were no vitals filed for this visit.  Subjective Assessment - 10/03/18 1620    Subjective  Pt stated she feels she over did it with HEP.  Heel is feeling better, has been completing the exercises given and wears brace when walking for long periods of time.  Current pain scale 3/10.  Has been walking some around the house wihtout SPC, continues with stairs and outdoors.    Pertinent History  Lt TKR    Patient Stated Goals  get back to PLOF and more, get down on knees.  Ability to transfer from floor to standing    Currently in Pain?  Yes    Pain Score  3     Pain Location  Knee    Pain Orientation  Left    Pain Descriptors / Indicators  Aching    Pain Type  Surgical pain    Pain Onset  1 to 4 weeks ago    Pain Frequency  Constant    Aggravating Factors   bending    Pain Relieving Factors  ice    Effect of Pain on  Daily Activities  limits                       [facility] Adult PT Treatment/Exercise - 10/03/18 0001      Exercises   Exercises  Knee/Hip      Knee/Hip Exercises: Stretches   Active Hamstring Stretch  Left;3 reps;30 seconds    Active Hamstring Stretch Limitations  supine with rope    Quad Stretch  30 seconds;2 reps    Quad Stretch Limitations  prone with rope    Gastroc Stretch  Both;3 reps;30 seconds    Gastroc Stretch Limitations  slant board       Knee/Hip Exercises: Aerobic   Stationary Bike  5' full revolution seat 14      Knee/Hip Exercises: Standing   Heel Raises  Both;10 reps    Heel Raises Limitations  slope     Terminal Knee Extension  15 reps;Theraband    Theraband Level (Terminal Knee Extension)  Level 2 (Red)    Terminal Knee Extension Limitations  5" holds    Lateral Step Up  Left;15 reps;Hand Hold: 1;Step Height: 4"    Forward Step Up  Left;1 set;15 reps;Hand Hold: 1;Step Height: 2"    Step Down  Left;1 set;10 reps;Hand Hold: 2;Step Height: 4"    SLS  Rt 20", Lt 19"       Knee/Hip Exercises: Supine   Short Arc Quad Sets  Left;2 sets;10 reps    Short Arc Quad Sets Limitations  3" holds    Heel Slides  10 reps    Knee Extension  AROM    Knee Extension Limitations  5    Knee Flexion  AROM    Knee Flexion Limitations  115      Manual Therapy   Manual Therapy  Myofascial release    Manual therapy comments  Manual complete separate than rest of tx    Myofascial Release  Scar tissue mobilizaiton to distal incision only          Balance Exercises - 10/03/18 1701      Balance Exercises: Standing   Tandem Stance  Eyes open;Foam/compliant surface;2 reps;30 secs          PT Short Term Goals - 09/24/18 1410      PT SHORT TERM GOAL #1   Title  Patient to demonstrate improved gait pattern with elimination of antalgic pattern and pain no more than 2/10 with gait    Time  2    Period  Weeks    Status  On-going    Target Date  09/29/18      PT SHORT TERM GOAL #2   Title  Patient to be able to maintain tandem stance for at least 20 seconds bilaterally with no UE support to show improved functional balance    Time  2    Period  Weeks    Status  On-going      PT SHORT TERM GOAL #3   Title  Patient to be compliant with HEP, to updated PRN    Time  2    Period  Weeks    Status  On-going        PT Long Term Goals - 09/24/18 1410      PT LONG TERM GOAL #1   Title  L knee ROM to have improved to 0-110 degrees in order to improve gait mechanics and reduce feelings of pain/stiffness    Time  4  Period  Weeks    Status  On-going      PT LONG TERM GOAL #2   Title  Patient to tolerate gait at least 1526ft without increased pain to improve ability to access community    Time  4    Period  Weeks    Status  On-going      PT LONG TERM GOAL #3   Title  Patient to demonstrate MMT as being at least 4+/5 in all tested groups in order to improve function and reduce pain    Time  4    Period  Weeks    Status  On-going      PT LONG TERM GOAL #4    Title  Patient to be able to reciprocally ascend/descend flight of steps with U railing and no increase in pain to improve community access    Time  4    Period  Weeks    Status  On-going      PT LONG TERM GOAL #5   Title  Patient to be able to kneel on one knee with pain no more than 4/10 in order to improve functional mobility and allow for tasks such as gardening and cleaning    Time  4    Period  Weeks    Status  On-going            Plan - 10/03/18 1715    Clinical Impression Statement  Pt progressing well towards goals.  Able to ambulate wihtout AD through session with no reoprts of heel or knee pain.  Continued session focus with knee mobility and functional strengthening.  Added stretches for mobility.  Also noted scar tissue on distal incision that is fully healed, gentle manual myofascial release complete only to distal portion to assist with knee mobility.  Improved AROM 5-115 degrees (was 6-107 degrees last session).  Also able to increase step up height for functional strengthening with minimal difficulty.  Pt given advanced HEP including SAQ for distal quad strengthening, hamstring stretch in supine and SLS to begin at home, verbalized understanding.    Personal Factors and Comorbidities  Age;Fitness;Comorbidity 2    Examination-Activity Limitations  Bed Mobility;Stand;Lift;Locomotion Level;Bend;Reach Overhead;Transfers;Carry;Sit;Sleep;Squat;Stairs    Examination-Participation Restrictions  Church;Yard Work;Cleaning;Meal Prep;Community Activity;Driving;Laundry;Shop    Stability/Clinical Decision Making  Stable/Uncomplicated    Clinical Decision Making  Low    Rehab Potential  Good    PT Frequency  3x / week    PT Duration  4 weeks    PT Treatment/Interventions  ADLs/Self Care Home Management;Cryotherapy;Electrical Stimulation;Iontophoresis 4mg /ml Dexamethasone;Moist Heat;DME Instruction;Gait training;Stair training;Functional mobility training;Therapeutic  activities;Therapeutic exercise;Balance training;Neuromuscular re-education;Patient/family education;Manual techniques;Compression bandaging;Scar mobilization;Passive range of motion;Dry needling;Taping    PT Next Visit Plan  10th visit progress note next session.  Continue gait and stair training with SPC. Contiue to focus on ROM, quad strength, add balance and gentle patellar mobs.    PT Home Exercise Plan  hospital HEP (quad set, heel slides, ankle pumps, LAQ, supine abd/add) plus focus on supine heel slides and seated flexion overpressure, walking program, edema management program; 7/29: heel prop 1 min; 8/7: LLE HS seated stretch and LLE calf standing stretch; 8/14: SAQ, HS st, SLS       Patient will benefit from skilled therapeutic intervention in order to improve the following deficits and impairments:  Abnormal gait, Decreased knowledge of use of DME, Decreased skin integrity, Increased fascial restricitons, Impaired sensation, Improper body mechanics, Pain, Decreased coordination, Decreased mobility, Increased muscle spasms, Impaired  tone, Decreased activity tolerance, Decreased range of motion, Decreased strength, Hypomobility, Decreased balance, Decreased safety awareness, Difficulty walking, Increased edema  Visit Diagnosis: 1. Localized edema   2. Difficulty in walking, not elsewhere classified   3. Acute postoperative pain of left knee   4. Other symptoms and signs involving the musculoskeletal system        Problem List Patient Active Problem List   Diagnosis Date Noted  . S/P left TKA 09/09/2018  . Status post total left knee replacement 09/09/2018  . Pain of left heel 07/03/2017  . Pain in joint of right hip 05/17/2017  . Benign essential HTN 11/17/2015  . Gastro-esophageal reflux disease without esophagitis 10/12/2014  . Hypertriglyceridemia 10/12/2014  . Hypertension 05/21/2013  . Unspecified hypothyroidism 05/21/2013  . Unspecified vitamin D deficiency 05/21/2013     Ihor Austin, LPTA; [place]  Aldona Lento 10/03/2018, 5:38 PM  [place] [address] [place], [place], [address] Phone: [phone]   Fax:  [phone]  Name: CANDI PROFIT MRN: [id number] Date of Birth: 11/04/1950

## 2018-10-06 ENCOUNTER — Encounter (HOSPITAL_COMMUNITY): Payer: Medicare Other | Admitting: Physical Therapy

## 2018-10-06 ENCOUNTER — Other Ambulatory Visit: Payer: Self-pay

## 2018-10-06 ENCOUNTER — Ambulatory Visit (HOSPITAL_COMMUNITY): Payer: Medicare Other | Admitting: Physical Therapy

## 2018-10-06 ENCOUNTER — Encounter (HOSPITAL_COMMUNITY): Payer: Self-pay | Admitting: Physical Therapy

## 2018-10-06 DIAGNOSIS — M25562 Pain in left knee: Secondary | ICD-10-CM

## 2018-10-06 DIAGNOSIS — R600 Localized edema: Secondary | ICD-10-CM

## 2018-10-06 DIAGNOSIS — R29898 Other symptoms and signs involving the musculoskeletal system: Secondary | ICD-10-CM

## 2018-10-06 DIAGNOSIS — G8918 Other acute postprocedural pain: Secondary | ICD-10-CM

## 2018-10-06 DIAGNOSIS — R262 Difficulty in walking, not elsewhere classified: Secondary | ICD-10-CM | POA: Diagnosis not present

## 2018-10-06 NOTE — Patient Instructions (Signed)
   Supine Knee Extension  Lying on your back, elevate your foot using a pillow or stack of blankets or anything comfortable, but high enough so your knee is not touching the table. Place a weight over your knee, and just relax. Allow the weight to passively stretch the back of your knee and move it more into extension.  Hold this position for 2-3 minutes.   As it gets easier, you can increase the weight on your knee and/or the time you are doing this exercise.  Repeat 1-2 times per day.    Prone Knee Extension Stretch  1. Lying on your stomach, roll small towel and place ABOVE knee cap 2. gently lower leg to feel stretch in knee 3. you can push down with opposite leg to get more stretch  Hold for 2-3 minutes, 1-2 times per day.

## 2018-10-06 NOTE — Therapy (Signed)
[facility] [address] [place], [place], [address] Phone: [phone]   Fax:  [phone]  Physical Therapy Treatment  Patient Details  Name: Linda Woods MRN: [id number] Date of Birth: [birth year] Referring Provider (PT): Isac Caddy    Encounter Date: 10/06/2018   Progress Note Reporting Period 09/15/18 to 10/06/18  See note below for Objective Data and Assessment of Progress/Goals.       PT End of Session - 10/06/18 1105    Visit Number  10    Number of Visits  16    Date for PT Re-Evaluation  11/03/18    Authorization Type  [employer] Medicare (progress note and G-code due visit 10)    Authorization Time Period  09/15/18 to 08/11/27; NEW cert 7/98 to 11/09/17    Authorization - Visit Number  10    Authorization - Number of Visits  10    PT Start Time  1017    PT Stop Time  1100    PT Time Calculation (min)  43 min    Activity Tolerance  Patient tolerated treatment well    Behavior During Therapy  WFL for tasks assessed/performed       Past Medical History:  Diagnosis Date  . Bursitis   . Chronic reflux esophagitis   . Complication of anesthesia   . Diverticulosis   . GERD (gastroesophageal reflux disease)   . History of kidney stones   . Hypertension   . Knee pain    right knee-seeing ortho  . Osteoarthritis   . Plantar fasciitis   . Pure hypercholesterolemia   . RLS (restless legs syndrome)   . Thyroid disease    hypothyroid    Past Surgical History:  Procedure Laterality Date  . APPENDECTOMY    . CARPAL TUNNEL RELEASE Right 2011   Dr. Amedeo Plenty  . COLPORRHAPHY     posterior  . HAND SURGERY Right 2016   Nerve surgery, Dr. Amedeo Plenty  . OVARIAN CYST REMOVAL    . RECTOCELE REPAIR  2011   w/TVH and sling  . TONSILLECTOMY    . TONSILLECTOMY    . TOTAL KNEE ARTHROPLASTY Left 09/09/2018   Procedure: TOTAL KNEE ARTHROPLASTY, CORTISONE INJECTION RIGHT KNEE;  Surgeon: Paralee Cancel, MD;  Location: [facility];   Service: Orthopedics;  Laterality: Left;  70 mins  . TOTAL VAGINAL HYSTERECTOMY  10/18/2009   rectocele repair, sling  . TUBAL LIGATION Bilateral   . VARICOSE VEIN SURGERY      There were no vitals filed for this visit.  Subjective Assessment - 10/06/18 1019    Subjective  I did too much over this past weekend including navigation of multiple flights of stairs. I still ache at night. Standing on one leg still hurts like heck. Regular things throughout my day are going OK, I am just slow on steps.    How long can you sit comfortably?  8/17- in a car it bothers me, it depends on where it hits the back of my leg. 75 minute max.    How long can you stand comfortably?  8/17- 40 minutes as long as I am able to move around    How long can you walk comfortably?  8/17- 15-20 minutes    Patient Stated Goals  get back to PLOF and more, get down on knees.  Ability to transfer from floor to standing    Currently in Pain?  Yes    Pain Score  4  Pain Location  Leg    Pain Orientation  Left    Pain Descriptors / Indicators  Throbbing    Pain Type  Surgical pain    Pain Radiating Towards  radiating up calf and back of leg    Pain Onset  1 to 4 weeks ago    Pain Frequency  Constant    Aggravating Factors   any pressure on back of thigh    Pain Relieving Factors  ice    Effect of Pain on Daily Activities  mild         [facility] PT Assessment - 10/06/18 0001      Assessment   Medical Diagnosis  L TKR     Referring Provider (PT)  Rodman Key Babish/[facility]     Onset Date/Surgical Date  09/09/18    Next MD Visit  September 2nd     Prior Therapy  none       Precautions   Precaution Comments  TKR       Restrictions   Other Position/Activity Restrictions  WBAT       Balance Screen   Has the patient fallen in the past 6 months  Yes    How many times?  1    Has the patient had a decrease in activity level because of a fear of falling?   No    Is the patient reluctant to leave their home because  of a fear of falling?   No      Home Film/video editor residence      Prior Function   Level of Independence  Independent      Observation/Other Assessments   Observations  able to maintain tandem stance 20  seconds B       AROM   Left Knee Extension  5    Left Knee Flexion  114      Strength   Right Hip Flexion  3/5    Right Hip Extension  4+/5    Right Hip ABduction  4+/5    Left Hip Flexion  3+/5    Left Hip Extension  4+/5    Left Hip ABduction  5/5    Right Knee Flexion  4+/5    Right Knee Extension  5/5    Left Knee Flexion  4+/5    Left Knee Extension  5/5    Right Ankle Dorsiflexion  5/5    Left Ankle Dorsiflexion  5/5      Ambulation/Gait   Gait Comments  able to complete steps reciprocally with B railings, S                    [facility] Adult PT Treatment/Exercise - 10/06/18 0001      Knee/Hip Exercises: Supine   Other Supine Knee/Hip Exercises  knee extension stretch with towel under ankle and bolster x3 minutes with 3# weight       Knee/Hip Exercises: Prone   Other Prone Exercises  prone knee extension stretch x3 minutes           Balance Exercises - 10/06/18 1051      Balance Exercises: Standing   Standing Eyes Closed  Narrow base of support (BOS);Foam/compliant surface;3 reps;30 secs    Tandem Stance  Eyes open;Foam/compliant surface;3 reps;15 secs        PT Education - 10/06/18 1104    Education Details  progress with PT, POC moving forward, HEP updates as she is to work  on knee extension at home so we can do more advanced interventions here    Person(s) Educated  Patient    Methods  Explanation;Handout;Demonstration    Comprehension  Verbalized understanding;Returned demonstration       PT Short Term Goals - 10/06/18 1036      PT SHORT TERM GOAL #1   Title  Patient to demonstrate improved gait pattern with elimination of antalgic pattern and pain no more than 2/10 with gait    Baseline  8/17- gait  pattern improved, pain depends on time of day and can still go up to 6/10    Time  2    Period  Weeks    Status  Partially Met      PT SHORT TERM GOAL #2   Title  Patient to be able to maintain tandem stance for at least 20 seconds bilaterally with no UE support to show improved functional balance    Baseline  8/17- flowsheets    Time  2    Period  Weeks    Status  Achieved      PT SHORT TERM GOAL #3   Title  Patient to be compliant with HEP, to updated PRN    Baseline  8/17- compliant but SLS is very painful    Time  2    Period  Weeks    Status  Achieved        PT Long Term Goals - 10/06/18 1037      PT LONG TERM GOAL #1   Title  L knee ROM to have improved to 0-110 degrees in order to improve gait mechanics and reduce feelings of pain/stiffness    Baseline  8/17- 5-115    Time  4    Period  Weeks    Status  Partially Met      PT LONG TERM GOAL #2   Title  Patient to tolerate gait at least 1575f without increased pain to improve ability to access community    Baseline  8/17- pain limited but can do it    Time  4    Period  Weeks    Status  Achieved      PT LONG TERM GOAL #3   Title  Patient to demonstrate MMT as being at least 4+/5 in all tested groups in order to improve function and reduce pain    Baseline  8/17- flowsheets    Time  4    Period  Weeks    Status  Partially Met      PT LONG TERM GOAL #4   Title  Patient to be able to reciprocally ascend/descend flight of steps with B railing and no increase in pain to improve community access    Baseline  8/17- has 2 railings where she needs to do steps    Status  Revised      PT LONG TERM GOAL #5   Title  Patient to be able to kneel on one knee with pain no more than 4/10 in order to improve functional mobility and allow for tasks such as gardening and cleaning    Baseline  8/17- did not attempt    Time  4    Period  Weeks    Status  On-going            Plan - 10/06/18 1106    Clinical Impression  Statement  Progress assessment and note completed today per insurance requirements. Ms. BMonacois making great progress with skilled  PT services, however continues to demonstrate knee extension ROM deficit, mild functional strength deficits, localized edema and scar tissues related restrictions, and reduced functional activity tolerance. Recommend continuation of skilled PT services to address remaining deficits before DC to advanced HEP moving forward.    Personal Factors and Comorbidities  Age;Fitness;Comorbidity 2    Examination-Activity Limitations  Bed Mobility;Stand;Lift;Locomotion Level;Bend;Reach Overhead;Transfers;Carry;Sit;Sleep;Squat;Stairs    Examination-Participation Restrictions  Church;Yard Work;Cleaning;Meal Prep;Community Activity;Driving;Laundry;Shop    Stability/Clinical Decision Making  Stable/Uncomplicated    Clinical Decision Making  Low    Rehab Potential  Good    PT Frequency  2x / week    PT Duration  3 weeks    PT Treatment/Interventions  ADLs/Self Care Home Management;Cryotherapy;Electrical Stimulation;Iontophoresis 19m/ml Dexamethasone;Moist Heat;DME Instruction;Gait training;Stair training;Functional mobility training;Therapeutic activities;Therapeutic exercise;Balance training;Neuromuscular re-education;Patient/family education;Manual techniques;Compression bandaging;Scar mobilization;Passive range of motion;Dry needling;Taping    PT Next Visit Plan  she is working on last bit of ROM at home; avoid SLS activities due to irritability and extended time to reduce pain after. Bike for ROM.  Focus on advanced functional strength and balance, edema control and scar management.    PT Home Exercise Plan  hospital HEP (quad set, heel slides, ankle pumps, LAQ, supine abd/add) plus focus on supine heel slides and seated flexion overpressure, walking program, edema management program; 7/29: heel prop 1 min; 8/7: LLE HS seated stretch and LLE calf standing stretch; 8/14: SAQ, HS st, SLS;  8/17 removed SLS due to irritablity with exercise, supine and prone knee extension stretches    Consulted and Agree with Plan of Care  Patient       Patient will benefit from skilled therapeutic intervention in order to improve the following deficits and impairments:  Abnormal gait, Decreased knowledge of use of DME, Decreased skin integrity, Increased fascial restricitons, Impaired sensation, Improper body mechanics, Pain, Decreased coordination, Decreased mobility, Increased muscle spasms, Impaired tone, Decreased activity tolerance, Decreased range of motion, Decreased strength, Hypomobility, Decreased balance, Decreased safety awareness, Difficulty walking, Increased edema  Visit Diagnosis: 1. Localized edema   2. Difficulty in walking, not elsewhere classified   3. Acute postoperative pain of left knee   4. Other symptoms and signs involving the musculoskeletal system        Problem List Patient Active Problem List   Diagnosis Date Noted  . S/P left TKA 09/09/2018  . Status post total left knee replacement 09/09/2018  . Pain of left heel 07/03/2017  . Pain in joint of right hip 05/17/2017  . Benign essential HTN 11/17/2015  . Gastro-esophageal reflux disease without esophagitis 10/12/2014  . Hypertriglyceridemia 10/12/2014  . Hypertension 05/21/2013  . Unspecified hypothyroidism 05/21/2013  . Unspecified vitamin D deficiency 05/21/2013    KDeniece ReePT, DPT, CBIS  Supplemental Physical Therapist [facility]   Pager [phone] Rehab Office [address]SS[address] [place] [address]Phone: [phone]  Fax:  [phone] Name: Linda SANDMRN: [id number] of Birth: [birth year]

## 2018-10-08 ENCOUNTER — Encounter (HOSPITAL_COMMUNITY): Payer: Medicare Other | Admitting: Physical Therapy

## 2018-10-08 ENCOUNTER — Other Ambulatory Visit: Payer: Self-pay

## 2018-10-08 ENCOUNTER — Encounter (HOSPITAL_COMMUNITY): Payer: Self-pay | Admitting: Physical Therapy

## 2018-10-08 ENCOUNTER — Ambulatory Visit (HOSPITAL_COMMUNITY): Payer: Medicare Other | Admitting: Physical Therapy

## 2018-10-08 DIAGNOSIS — G8918 Other acute postprocedural pain: Secondary | ICD-10-CM

## 2018-10-08 DIAGNOSIS — R262 Difficulty in walking, not elsewhere classified: Secondary | ICD-10-CM

## 2018-10-08 DIAGNOSIS — R600 Localized edema: Secondary | ICD-10-CM

## 2018-10-08 DIAGNOSIS — M25562 Pain in left knee: Secondary | ICD-10-CM

## 2018-10-08 DIAGNOSIS — R29898 Other symptoms and signs involving the musculoskeletal system: Secondary | ICD-10-CM

## 2018-10-08 NOTE — Therapy (Signed)
[facility] [place], [place], [address] Phone: [phone]   Fax:  [phone]  Physical Therapy Treatment  Patient Details  Name: Linda Woods MRN: [id number] Date of Birth: [birth year] Referring Provider (PT): Isac Caddy    Encounter Date: 10/08/2018  PT End of Session - 10/08/18 1000    Visit Number  11    Number of Visits  16    Date for PT Re-Evaluation  11/03/18    Authorization Type  [employer] Medicare (progress note and G-code due visit 10)    Authorization Time Period  09/15/18 to 3/66/29; NEW cert 4/76 to 5/46/50    Authorization - Visit Number  1    Authorization - Number of Visits  10    PT Start Time  0913    PT Stop Time  0956    PT Time Calculation (min)  43 min    Activity Tolerance  Patient tolerated treatment well    Behavior During Therapy  [facility] for tasks assessed/performed       Past Medical History:  Diagnosis Date  . Bursitis   . Chronic reflux esophagitis   . Complication of anesthesia   . Diverticulosis   . GERD (gastroesophageal reflux disease)   . History of kidney stones   . Hypertension   . Knee pain    right knee-seeing ortho  . Osteoarthritis   . Plantar fasciitis   . Pure hypercholesterolemia   . RLS (restless legs syndrome)   . Thyroid disease    hypothyroid    Past Surgical History:  Procedure Laterality Date  . APPENDECTOMY    . CARPAL TUNNEL RELEASE Right 2011   Dr. Amedeo Plenty  . COLPORRHAPHY     posterior  . HAND SURGERY Right 2016   Nerve surgery, Dr. Amedeo Plenty  . OVARIAN CYST REMOVAL    . RECTOCELE REPAIR  2011   w/TVH and sling  . TONSILLECTOMY    . TONSILLECTOMY    . TOTAL KNEE ARTHROPLASTY Left 09/09/2018   Procedure: TOTAL KNEE ARTHROPLASTY, CORTISONE INJECTION RIGHT KNEE;  Surgeon: Paralee Cancel, MD;  Location: [facility];  Service: Orthopedics;  Laterality: Left;  70 mins  . TOTAL VAGINAL HYSTERECTOMY  10/18/2009   rectocele repair, sling  . TUBAL  LIGATION Bilateral   . VARICOSE VEIN SURGERY      There were no vitals filed for this visit.  Subjective Assessment - 10/08/18 0915    Subjective  I had a bad night last night, I ached all night. I might have used too heavy of a bag for that stretch. I'm confused on what exercises I should be doing as I have a lot. My foot still hurts.    Pertinent History  Lt TKR    Patient Stated Goals  get back to PLOF and more, get down on knees.  Ability to transfer from floor to standing    Currently in Pain?  Yes    Pain Score  5     Pain Location  Leg    Pain Orientation  Left    Pain Descriptors / Indicators  Throbbing    Pain Type  Surgical pain    Pain Radiating Towards  up calf                       [facility] Adult PT Treatment/Exercise - 10/08/18 0001      Knee/Hip Exercises: Stretches   Active Hamstring Stretch  Left;3 reps;30  seconds    Knee: Self-Stretch to increase Flexion  10 seconds    Knee: Self-Stretch Limitations  10x on 12 inch box     Gastroc Stretch  Both;3 reps;30 seconds    Gastroc Stretch Limitations  slant board     Other Knee/Hip Stretches  plantar fascia stretch 3x30 seconds       Knee/Hip Exercises: Standing   Heel Raises  Both;1 set;15 reps    Heel Raises Limitations  heel and toe     Lateral Step Up  Left;1 set;15 reps;Hand Hold: 1    Forward Step Up  Left;1 set;15 reps;Hand Hold: 1    Step Down  Left;2 sets;10 reps;Hand Hold: 1;Step Height: 4"    Other Standing Knee Exercises  hip hikes with and without swing 1x10 B       Knee/Hip Exercises: Supine   Bridges  Both;1 set;10 reps    Bridges Limitations  2 second holds     Straight Leg Raises  Both;1 set;10 reps    Straight Leg Raises Limitations  with quad set       Knee/Hip Exercises: Sidelying   Hip ABduction  Both;1 set;10 reps      Knee/Hip Exercises: Prone   Hip Extension  Both;1 set;10 reps      Manual Therapy   Manual Therapy  Edema management    Manual therapy comments  Manual  complete separate than rest of tx    Edema Management  retrograde massage with LE elevated              PT Education - 10/08/18 1000    Education Details  continue with HEP as written but can break up through the day    Person(s) Educated  Patient    Methods  Explanation;Handout    Comprehension  Verbalized understanding;Returned demonstration       PT Short Term Goals - 10/06/18 1036      PT SHORT TERM GOAL #1   Title  Patient to demonstrate improved gait pattern with elimination of antalgic pattern and pain no more than 2/10 with gait    Baseline  8/17- gait pattern improved, pain depends on time of day and can still go up to 6/10    Time  2    Period  Weeks    Status  Partially Met      PT SHORT TERM GOAL #2   Title  Patient to be able to maintain tandem stance for at least 20 seconds bilaterally with no UE support to show improved functional balance    Baseline  8/17- flowsheets    Time  2    Period  Weeks    Status  Achieved      PT SHORT TERM GOAL #3   Title  Patient to be compliant with HEP, to updated PRN    Baseline  8/17- compliant but SLS is very painful    Time  2    Period  Weeks    Status  Achieved        PT Long Term Goals - 10/06/18 1037      PT LONG TERM GOAL #1   Title  L knee ROM to have improved to 0-110 degrees in order to improve gait mechanics and reduce feelings of pain/stiffness    Baseline  8/17- 5-115    Time  4    Period  Weeks    Status  Partially Met      PT LONG TERM GOAL #2  Title  Patient to tolerate gait at least 1564f without increased pain to improve ability to access community    Baseline  8/17- pain limited but can do it    Time  4    Period  Weeks    Status  Achieved      PT LONG TERM GOAL #3   Title  Patient to demonstrate MMT as being at least 4+/5 in all tested groups in order to improve function and reduce pain    Baseline  8/17- flowsheets    Time  4    Period  Weeks    Status  Partially Met      PT LONG  TERM GOAL #4   Title  Patient to be able to reciprocally ascend/descend flight of steps with B railing and no increase in pain to improve community access    Baseline  8/17- has 2 railings where she needs to do steps    Status  Revised      PT LONG TERM GOAL #5   Title  Patient to be able to kneel on one knee with pain no more than 4/10 in order to improve functional mobility and allow for tasks such as gardening and cleaning    Baseline  8/17- did not attempt    Time  4    Period  Weeks    Status  On-going            Plan - 10/08/18 1001    Clinical Impression Statement  Patient arrives today stating that she had a bad night as her knee was aching all night; she has been working on her new stretches but feels she may have used too much weight for them. Continued with stretching based warmup and performed functional strength and balance as tolerated within limits of knee and foot pain this session. Reviewed and highlighted exercises in HEP that patient should be focusing on at this point. She does report throbbing pain in her LE, however per visual assessment do not note excessive edema, color changes, or other signs of concern that would be associated with DVT- will continue to monitor moving forward. Patient reports increased back pain since surgery; she does demonstrate mild leg length discrepancy, [place]    Personal Factors and Comorbidities  Age;Fitness;Comorbidity 2    Examination-Activity Limitations  Bed Mobility;Stand;Lift;Locomotion Level;Bend;Reach Overhead;Transfers;Carry;Sit;Sleep;Squat;Stairs    Examination-Participation Restrictions  Church;Yard Work;Cleaning;Meal Prep;Community Activity;Driving;Laundry;Shop    Stability/Clinical Decision Making  Stable/Uncomplicated    Clinical Decision Making  Low    Rehab Potential  Good    PT Frequency  2x / week    PT Duration  3 weeks    PT  Treatment/Interventions  ADLs/Self Care Home Management;Cryotherapy;Electrical Stimulation;Iontophoresis 426mml Dexamethasone;Moist Heat;DME Instruction;Gait training;Stair training;Functional mobility training;Therapeutic activities;Therapeutic exercise;Balance training;Neuromuscular re-education;Patient/family education;Manual techniques;Compression bandaging;Scar mobilization;Passive range of motion;Dry needling;Taping    PT Next Visit Plan  she is working on last bit of ROM at home; avoid SLS activities due to irritability and extended time to reduce pain after. Bike for ROM.  Focus on advanced functional strength and balance, edema control and scar management.    PT Home Exercise Plan  hospital HEP (quad set, heel slides, ankle pumps, LAQ, supine abd/add) plus focus on supine heel slides and seated flexion overpressure, walking program, edema management program; 7/29: heel prop 1 min; 8/7: LLE HS seated stretch and LLE calf standing stretch; 8/14: SAQ, HS st,  SLS; 8/17 removed SLS due to irritablity with exercise, supine and prone knee extension stretches    Consulted and Agree with Plan of Care  Patient       Patient will benefit from skilled therapeutic intervention in order to improve the following deficits and impairments:  Abnormal gait, Decreased knowledge of use of DME, Decreased skin integrity, Increased fascial restricitons, Impaired sensation, Improper body mechanics, Pain, Decreased coordination, Decreased mobility, Increased muscle spasms, Impaired tone, Decreased activity tolerance, Decreased range of motion, Decreased strength, Hypomobility, Decreased balance, Decreased safety awareness, Difficulty walking, Increased edema  Visit Diagnosis: 1. Localized edema   2. Difficulty in walking, not elsewhere classified   3. Acute postoperative pain of left knee   4. Other symptoms and signs involving the musculoskeletal system        Problem List Patient Active Problem List    Diagnosis Date Noted  . S/P left TKA 09/09/2018  . Status post total left knee replacement 09/09/2018  . Pain of left heel 07/03/2017  . Pain in joint of right hip 05/17/2017  . Benign essential HTN 11/17/2015  . Gastro-esophageal reflux disease without esophagitis 10/12/2014  . Hypertriglyceridemia 10/12/2014  . Hypertension 05/21/2013  . Unspecified hypothyroidism 05/21/2013  . Unspecified vitamin D deficiency 05/21/2013    Deniece Ree PT, DPT, CBIS  Supplemental Physical Therapist [facility]    Pager [phone] Acute Rehab Office [place] [address] [place], [place], [address] Phone: [phone]   Fax:  [phone]  Name: Linda Woods MRN: [id number] Date of Birth: 1950/07/04

## 2018-10-10 ENCOUNTER — Encounter (HOSPITAL_COMMUNITY): Payer: Self-pay | Admitting: Physical Therapy

## 2018-10-10 ENCOUNTER — Other Ambulatory Visit: Payer: Self-pay

## 2018-10-10 ENCOUNTER — Ambulatory Visit (HOSPITAL_COMMUNITY): Payer: Medicare Other | Admitting: Physical Therapy

## 2018-10-10 ENCOUNTER — Encounter (HOSPITAL_COMMUNITY): Payer: Medicare Other | Admitting: Physical Therapy

## 2018-10-10 DIAGNOSIS — R600 Localized edema: Secondary | ICD-10-CM

## 2018-10-10 DIAGNOSIS — R29898 Other symptoms and signs involving the musculoskeletal system: Secondary | ICD-10-CM

## 2018-10-10 DIAGNOSIS — R262 Difficulty in walking, not elsewhere classified: Secondary | ICD-10-CM | POA: Diagnosis not present

## 2018-10-10 DIAGNOSIS — G8918 Other acute postprocedural pain: Secondary | ICD-10-CM

## 2018-10-10 DIAGNOSIS — M25562 Pain in left knee: Secondary | ICD-10-CM

## 2018-10-10 NOTE — Therapy (Signed)
[place], [place], [address] Phone: [phone]   Fax:  [phone]  Physical Therapy Treatment  Patient Details  Name: Linda Woods MRN: [id number] Date of Birth: 12-Oct-1950 Referring Provider (PT): Isac Caddy    Encounter Date: 10/10/2018  PT End of Session - 10/10/18 1110    Visit Number  12    Number of Visits  16    Date for PT Re-Evaluation  11/03/18    Authorization Type  BCBS Medicare (progress note and G-code due visit 10)    Authorization Time Period  09/15/18 to 3/88/82; NEW cert 8/00 to 3/49/17    Authorization - Visit Number  2    Authorization - Number of Visits  10    PT Start Time  1028   ice not included in billing   PT Stop Time  1058    PT Time Calculation (min)  30 min    Activity Tolerance  Patient tolerated treatment well    Behavior During Therapy  [facility] for tasks assessed/performed       Past Medical History:  Diagnosis Date  . Bursitis   . Chronic reflux esophagitis   . Complication of anesthesia   . Diverticulosis   . GERD (gastroesophageal reflux disease)   . History of kidney stones   . Hypertension   . Knee pain    right knee-seeing ortho  . Osteoarthritis   . Plantar fasciitis   . Pure hypercholesterolemia   . RLS (restless legs syndrome)   . Thyroid disease    hypothyroid    Past Surgical History:  Procedure Laterality Date  . APPENDECTOMY    . CARPAL TUNNEL RELEASE Right 2011   Dr. Amedeo Plenty  . COLPORRHAPHY     posterior  . HAND SURGERY Right 2016   Nerve surgery, Dr. Amedeo Plenty  . OVARIAN CYST REMOVAL    . RECTOCELE REPAIR  2011   w/TVH and sling  . TONSILLECTOMY    . TONSILLECTOMY    . TOTAL KNEE ARTHROPLASTY Left 09/09/2018   Procedure: TOTAL KNEE ARTHROPLASTY, CORTISONE INJECTION RIGHT KNEE;  Surgeon: Paralee Cancel, MD;  Location: [facility];  Service: Orthopedics;  Laterality: Left;  70 mins  . TOTAL VAGINAL HYSTERECTOMY  10/18/2009   rectocele  repair, sling  . TUBAL LIGATION Bilateral   . VARICOSE VEIN SURGERY      There were no vitals filed for this visit.  Subjective Assessment - 10/10/18 1031    Subjective  I am having a lot of pain; my extension stretches seem to be setting it off. It is very hot and sore. My foot is better.    Patient Stated Goals  get back to PLOF and more, get down on knees.  Ability to transfer from floor to standing    Currently in Pain?  Yes    Pain Score  6     Pain Location  Knee    Pain Orientation  Left    Pain Descriptors / Indicators  Throbbing;Aching;Sore    Pain Type  Surgical pain                       [facility] Adult PT Treatment/Exercise - 10/10/18 0001      Knee/Hip Exercises: Seated   Sit to Sand  10 reps;without UE support   eccentric lower      Modalities   Modalities  Cryotherapy      Cryotherapy   Number  Minutes Cryotherapy  7 Minutes   not included in billing    Cryotherapy Location  Knee    Type of Cryotherapy  Ice pack      Manual Therapy   Manual Therapy  Edema management;Soft tissue mobilization    Manual therapy comments  Manual complete separate than rest of tx    Edema Management  retrograde massage with LE elevated     Soft tissue mobilization  L quad IASTM              PT Education - 10/10/18 1109    Education Details  role of quad spasms in knee pain and function; role of "light" days in recovery    Person(s) Educated  Patient    Methods  Explanation    Comprehension  Verbalized understanding       PT Short Term Goals - 10/06/18 1036      PT SHORT TERM GOAL #1   Title  Patient to demonstrate improved gait pattern with elimination of antalgic pattern and pain no more than 2/10 with gait    Baseline  8/17- gait pattern improved, pain depends on time of day and can still go up to 6/10    Time  2    Period  Weeks    Status  Partially Met      PT SHORT TERM GOAL #2   Title  Patient to be able to maintain tandem stance for at least  20 seconds bilaterally with no UE support to show improved functional balance    Baseline  8/17- flowsheets    Time  2    Period  Weeks    Status  Achieved      PT SHORT TERM GOAL #3   Title  Patient to be compliant with HEP, to updated PRN    Baseline  8/17- compliant but SLS is very painful    Time  2    Period  Weeks    Status  Achieved        PT Long Term Goals - 10/06/18 1037      PT LONG TERM GOAL #1   Title  L knee ROM to have improved to 0-110 degrees in order to improve gait mechanics and reduce feelings of pain/stiffness    Baseline  8/17- 5-115    Time  4    Period  Weeks    Status  Partially Met      PT LONG TERM GOAL #2   Title  Patient to tolerate gait at least 1581f without increased pain to improve ability to access community    Baseline  8/17- pain limited but can do it    Time  4    Period  Weeks    Status  Achieved      PT LONG TERM GOAL #3   Title  Patient to demonstrate MMT as being at least 4+/5 in all tested groups in order to improve function and reduce pain    Baseline  8/17- flowsheets    Time  4    Period  Weeks    Status  Partially Met      PT LONG TERM GOAL #4   Title  Patient to be able to reciprocally ascend/descend flight of steps with B railing and no increase in pain to improve community access    Baseline  8/17- has 2 railings where she needs to do steps    Status  Revised      PT LONG  TERM GOAL #5   Title  Patient to be able to kneel on one knee with pain no more than 4/10 in order to improve functional mobility and allow for tasks such as gardening and cleaning    Baseline  8/17- did not attempt    Time  4    Period  Weeks    Status  On-going            Plan - 10/10/18 1110    Clinical Impression Statement  Ms. Budnick arrives reporting increased levels of pain in her surgical LE today; examination reveals increased heat surrounding knee, as well as general tenderness surrounding knee joint. No signs or symptoms of DVT or  infection at incision site noted, however knee is quite warm and sore to palpation- will continue to monitor, encouraged her to call MD with concerns if she would like. Session modified today due to high pain levels. Noted excessive muscle spasm in L quad likely contributing to irritation and pain and spent quite a bit of time working on this this session. Pain improved to 4/10 at EOS.    Personal Factors and Comorbidities  Age;Fitness;Comorbidity 2    Examination-Activity Limitations  Bed Mobility;Stand;Lift;Locomotion Level;Bend;Reach Overhead;Transfers;Carry;Sit;Sleep;Squat;Stairs    Examination-Participation Restrictions  Church;Yard Work;Cleaning;Meal Prep;Community Activity;Driving;Laundry;Shop    Stability/Clinical Decision Making  Stable/Uncomplicated    Clinical Decision Making  Low    Rehab Potential  Good    PT Frequency  2x / week    PT Duration  3 weeks    PT Treatment/Interventions  ADLs/Self Care Home Management;Cryotherapy;Electrical Stimulation;Iontophoresis 16m/ml Dexamethasone;Moist Heat;DME Instruction;Gait training;Stair training;Functional mobility training;Therapeutic activities;Therapeutic exercise;Balance training;Neuromuscular re-education;Patient/family education;Manual techniques;Compression bandaging;Scar mobilization;Passive range of motion;Dry needling;Taping    PT Next Visit Plan  she is working on last bit of ROM at home; avoid SLS activities due to irritability and extended time to reduce pain after. Bike for ROM.  Quad massage. Focus on advanced functional strength and balance, edema control and scar management.    PT Home Exercise Plan  hospital HEP (quad set, heel slides, ankle pumps, LAQ, supine abd/add) plus focus on supine heel slides and seated flexion overpressure, walking program, edema management program; 7/29: heel prop 1 min; 8/7: LLE HS seated stretch and LLE calf standing stretch; 8/14: SAQ, HS st, SLS; 8/17 removed SLS due to irritablity with exercise,  supine and prone knee extension stretches    Consulted and Agree with Plan of Care  Patient       Patient will benefit from skilled therapeutic intervention in order to improve the following deficits and impairments:  Abnormal gait, Decreased knowledge of use of DME, Decreased skin integrity, Increased fascial restricitons, Impaired sensation, Improper body mechanics, Pain, Decreased coordination, Decreased mobility, Increased muscle spasms, Impaired tone, Decreased activity tolerance, Decreased range of motion, Decreased strength, Hypomobility, Decreased balance, Decreased safety awareness, Difficulty walking, Increased edema  Visit Diagnosis: Localized edema  Difficulty in walking, not elsewhere classified  Acute postoperative pain of left knee  Other symptoms and signs involving the musculoskeletal system     Problem List Patient Active Problem List   Diagnosis Date Noted  . S/P left TKA 09/09/2018  . Status post total left knee replacement 09/09/2018  . Pain of left heel 07/03/2017  . Pain in joint of right hip 05/17/2017  . Benign essential HTN 11/17/2015  . Gastro-esophageal reflux disease without esophagitis 10/12/2014  . Hypertriglyceridemia 10/12/2014  . Hypertension 05/21/2013  . Unspecified hypothyroidism 05/21/2013  . Unspecified vitamin D deficiency 05/21/2013  Deniece Ree PT, DPT, CBIS  Supplemental Physical Therapist [facility]    Pager [phone] Acute Rehab Office [place] [address] [place], [place], [address] Phone: [phone]   Fax:  [phone]  Name: Linda Woods MRN: [id number] Date of Birth: 1950/05/23

## 2018-10-13 ENCOUNTER — Telehealth (HOSPITAL_COMMUNITY): Payer: Self-pay | Admitting: Family Medicine

## 2018-10-13 NOTE — Telephone Encounter (Signed)
10/13/18  I called to offer an appt from the wait list for Thursday, Aug. 27 but she said that was when she would be out of town.

## 2018-10-14 ENCOUNTER — Encounter (HOSPITAL_COMMUNITY): Payer: Self-pay

## 2018-10-14 ENCOUNTER — Other Ambulatory Visit: Payer: Self-pay

## 2018-10-14 ENCOUNTER — Ambulatory Visit (HOSPITAL_COMMUNITY): Payer: Medicare Other

## 2018-10-14 DIAGNOSIS — R600 Localized edema: Secondary | ICD-10-CM

## 2018-10-14 DIAGNOSIS — R29898 Other symptoms and signs involving the musculoskeletal system: Secondary | ICD-10-CM

## 2018-10-14 DIAGNOSIS — M25562 Pain in left knee: Secondary | ICD-10-CM

## 2018-10-14 DIAGNOSIS — R262 Difficulty in walking, not elsewhere classified: Secondary | ICD-10-CM | POA: Diagnosis not present

## 2018-10-14 DIAGNOSIS — G8918 Other acute postprocedural pain: Secondary | ICD-10-CM

## 2018-10-14 NOTE — Therapy (Signed)
[place], [place], [address] Phone: [phone]   Fax:  [phone]  Physical Therapy Treatment  Patient Details  Name: Linda Woods MRN: [id number] Date of Birth: Jan 25, 1951 Referring Provider (PT): Isac Caddy    Encounter Date: 10/14/2018  PT End of Session - 10/14/18 1120    Visit Number  13    Number of Visits  16    Date for PT Re-Evaluation  11/03/18    Authorization Type  BCBS Medicare (progress note and G-code due visit 10)    Authorization Time Period  09/15/18 to 6/96/29; NEW cert 5/28 to 06/02/22    Authorization - Visit Number  3    Authorization - Number of Visits  10    PT Start Time  1117   3' on bike, not included with charges   PT Stop Time  1158    PT Time Calculation (min)  41 min    Activity Tolerance  Patient tolerated treatment well    Behavior During Therapy  [facility] for tasks assessed/performed       Past Medical History:  Diagnosis Date  . Bursitis   . Chronic reflux esophagitis   . Complication of anesthesia   . Diverticulosis   . GERD (gastroesophageal reflux disease)   . History of kidney stones   . Hypertension   . Knee pain    right knee-seeing ortho  . Osteoarthritis   . Plantar fasciitis   . Pure hypercholesterolemia   . RLS (restless legs syndrome)   . Thyroid disease    hypothyroid    Past Surgical History:  Procedure Laterality Date  . APPENDECTOMY    . CARPAL TUNNEL RELEASE Right 2011   Dr. Amedeo Plenty  . COLPORRHAPHY     posterior  . HAND SURGERY Right 2016   Nerve surgery, Dr. Amedeo Plenty  . OVARIAN CYST REMOVAL    . RECTOCELE REPAIR  2011   w/TVH and sling  . TONSILLECTOMY    . TONSILLECTOMY    . TOTAL KNEE ARTHROPLASTY Left 09/09/2018   Procedure: TOTAL KNEE ARTHROPLASTY, CORTISONE INJECTION RIGHT KNEE;  Surgeon: Paralee Cancel, MD;  Location: [facility];  Service: Orthopedics;  Laterality: Left;  70 mins  . TOTAL VAGINAL HYSTERECTOMY  10/18/2009    rectocele repair, sling  . TUBAL LIGATION Bilateral   . VARICOSE VEIN SURGERY      There were no vitals filed for this visit.  Subjective Assessment - 10/14/18 1118    Subjective  Pt stated she has a lot going on Lt LE with her vericose veins and plantar fascia, feels better today compared to last session.  Current pain scale 4/10 today.    Pertinent History  Lt TKR    Patient Stated Goals  get back to PLOF and more, get down on knees.  Ability to transfer from floor to standing    Currently in Pain?  Yes    Pain Score  4     Pain Location  Knee    Pain Orientation  Left    Pain Descriptors / Indicators  Sore;Aching    Pain Type  Surgical pain    Pain Onset  1 to 4 weeks ago    Pain Frequency  Constant    Aggravating Factors   any pressure on back of thigh    Pain Relieving Factors  ice    Effect of Pain on Daily Activities  mild  [facility] Adult PT Treatment/Exercise - 10/14/18 0001      Exercises   Exercises  Knee/Hip      Knee/Hip Exercises: Stretches   Active Hamstring Stretch  Left;3 reps;30 seconds    Active Hamstring Stretch Limitations  supine with rope    Quad Stretch  3 reps;30 seconds    Quad Stretch Limitations  prone with rope    Gastroc Stretch  Both;3 reps;30 seconds    Gastroc Stretch Limitations  slant board       Knee/Hip Exercises: Aerobic   Stationary Bike  3' full revolution on seat 12      Knee/Hip Exercises: Standing   Heel Raises  Both;1 set;15 reps    Heel Raises Limitations  heel and toe     Terminal Knee Extension  15 reps;Theraband    Theraband Level (Terminal Knee Extension)  Level 2 (Red)    Terminal Knee Extension Limitations  5" holds    Lateral Step Up  Left;1 set;15 reps;Hand Hold: 1;Step Height: 6"    Forward Step Up  Left;1 set;15 reps;Hand Hold: 1;Step Height: 6"    Step Down  Left;Hand Hold: 1;Step Height: 6";1 set;15 reps      Knee/Hip Exercises: Supine   Terminal Knee Extension  10 reps     Terminal Knee Extension Limitations  5" holds      Manual Therapy   Manual Therapy  Myofascial release;Edema management    Manual therapy comments  Manual complete separate than rest of tx    Edema Management  retrograde massage with LE elevated     Soft tissue mobilization  Lt quad STM    Myofascial Release  Scar tissue mobilizaiton to distal incision only             PT Education - 10/14/18 1252    Education Details  Discussed HEP compliance, added MFR around incision, discussed desentivitity techniques to improve tolerance for jeans touching for tolerance with compression hose for edema control    Person(s) Educated  Patient    Methods  Explanation    Comprehension  Verbalized understanding       PT Short Term Goals - 10/06/18 1036      PT SHORT TERM GOAL #1   Title  Patient to demonstrate improved gait pattern with elimination of antalgic pattern and pain no more than 2/10 with gait    Baseline  8/17- gait pattern improved, pain depends on time of day and can still go up to 6/10    Time  2    Period  Weeks    Status  Partially Met      PT SHORT TERM GOAL #2   Title  Patient to be able to maintain tandem stance for at least 20 seconds bilaterally with no UE support to show improved functional balance    Baseline  8/17- flowsheets    Time  2    Period  Weeks    Status  Achieved      PT SHORT TERM GOAL #3   Title  Patient to be compliant with HEP, to updated PRN    Baseline  8/17- compliant but SLS is very painful    Time  2    Period  Weeks    Status  Achieved        PT Long Term Goals - 10/06/18 1037      PT LONG TERM GOAL #1   Title  L knee ROM to have improved to 0-110 degrees in  order to improve gait mechanics and reduce feelings of pain/stiffness    Baseline  8/17- 5-115    Time  4    Period  Weeks    Status  Partially Met      PT LONG TERM GOAL #2   Title  Patient to tolerate gait at least 1560f without increased pain to improve ability to access  community    Baseline  8/17- pain limited but can do it    Time  4    Period  Weeks    Status  Achieved      PT LONG TERM GOAL #3   Title  Patient to demonstrate MMT as being at least 4+/5 in all tested groups in order to improve function and reduce pain    Baseline  8/17- flowsheets    Time  4    Period  Weeks    Status  Partially Met      PT LONG TERM GOAL #4   Title  Patient to be able to reciprocally ascend/descend flight of steps with B railing and no increase in pain to improve community access    Baseline  8/17- has 2 railings where she needs to do steps    Status  Revised      PT LONG TERM GOAL #5   Title  Patient to be able to kneel on one knee with pain no more than 4/10 in order to improve functional mobility and allow for tasks such as gardening and cleaning    Baseline  8/17- did not attempt    Time  4    Period  Weeks    Status  On-going            Plan - 10/14/18 1209    Clinical Impression Statement  Session focus on improving end range ROM and functional strengthening. Pt making good gains wiht AROM 5-118 degrees.  Added TKE supine and continued standing for knee extension strengthening.  Pt continues to have edema present proximal knee wiht some proximal heat, no s/s of infection.  Manual retrograde massage complete as well as soft tissue to address spasms in quad and MFR around incision.  Pt reports compliance with HEP and stated we have given her a lot of HEP, need to review/compact HEP.  Have delevoped HEP, give print out to pt. next session.    Personal Factors and Comorbidities  Age;Fitness;Comorbidity 2    Examination-Activity Limitations  Bed Mobility;Stand;Lift;Locomotion Level;Bend;Reach Overhead;Transfers;Carry;Sit;Sleep;Squat;Stairs    Examination-Participation Restrictions  Church;Yard Work;Cleaning;Meal Prep;Community Activity;Driving;Laundry;Shop    Stability/Clinical Decision Making  Stable/Uncomplicated    Clinical Decision Making  Low    Rehab  Potential  Good    PT Frequency  2x / week    PT Duration  3 weeks    PT Treatment/Interventions  ADLs/Self Care Home Management;Cryotherapy;Electrical Stimulation;Iontophoresis 459mml Dexamethasone;Moist Heat;DME Instruction;Gait training;Stair training;Functional mobility training;Therapeutic activities;Therapeutic exercise;Balance training;Neuromuscular re-education;Patient/family education;Manual techniques;Compression bandaging;Scar mobilization;Passive range of motion;Dry needling;Taping    PT Next Visit Plan  Give new compact HEP printout next session.  she is working on last bit of ROM at home; avoid SLS activities due to irritability and extended time to reduce pain after. Bike for ROM.  Quad massage. Focus on advanced functional strength and balance, edema control and scar management.    PT Home Exercise Plan  hospital HEP (quad set, heel slides, ankle pumps, LAQ, supine abd/add) plus focus on supine heel slides and seated flexion overpressure, walking program, edema management program; 7/29: heel prop  1 min; 8/7: LLE HS seated stretch and LLE calf standing stretch; 8/14: SAQ, HS st, SLS; 8/17 removed SLS due to irritablity with exercise, supine and prone knee extension stretches: 10/14/2018: TKE, bridges, heel/toe raises, HS st, plantar fascia stretch and quad stretch, LAQ       Patient will benefit from skilled therapeutic intervention in order to improve the following deficits and impairments:  Abnormal gait, Decreased knowledge of use of DME, Decreased skin integrity, Increased fascial restricitons, Impaired sensation, Improper body mechanics, Pain, Decreased coordination, Decreased mobility, Increased muscle spasms, Impaired tone, Decreased activity tolerance, Decreased range of motion, Decreased strength, Hypomobility, Decreased balance, Decreased safety awareness, Difficulty walking, Increased edema  Visit Diagnosis: Localized edema  Difficulty in walking, not elsewhere  classified  Acute postoperative pain of left knee  Other symptoms and signs involving the musculoskeletal system     Problem List Patient Active Problem List   Diagnosis Date Noted  . S/P left TKA 09/09/2018  . Status post total left knee replacement 09/09/2018  . Pain of left heel 07/03/2017  . Pain in joint of right hip 05/17/2017  . Benign essential HTN 11/17/2015  . Gastro-esophageal reflux disease without esophagitis 10/12/2014  . Hypertriglyceridemia 10/12/2014  . Hypertension 05/21/2013  . Unspecified hypothyroidism 05/21/2013  . Unspecified vitamin D deficiency 05/21/2013   Ihor Austin, LPTA; CBIS [phone]  Aldona Lento 10/14/2018, 12:55 PM  [place] [address] [place], [place], [address] Phone: [phone]   Fax:  [phone]  Name: FERMINA MISHKIN MRN: [id number] Date of Birth: Apr 29, 1950

## 2018-10-14 NOTE — Patient Instructions (Addendum)
Terminal Knee Extension    Lying on back with rolled towel (about 6 inches wide) under knee, slowly straighten knee to fully extended position. Hold 5 seconds, then relax. Repeat with other knee. Repeat 10 times. Do 2 sessions per day.  http://gt2.exer.us/260   Copyright  VHI. All rights reserved.   Bridge    Lie back, legs bent. Inhale, pressing hips up. Keeping ribs in, lengthen lower back. Exhale, rolling down along spine from top. Repeat 10 times. Do 2 sessions per day.  http://pm.exer.us/55   Copyright  VHI. All rights reserved.    Hamstring: Towel Stretch (Supine)    Lie on back. Loop towel around left foot, hip and knee at 90. Straighten knee and pull foot toward body. Hold 30 seconds. Relax. Repeat 3 times. Do 2 times a day. Repeat with other leg.  Copyright  VHI. All rights reserved.   Knee / Archie Balboa on stomach, knees together. Grab one ankle with same side hand, may use dog leash.  Gently pull foot toward buttock. Hold 30 seconds. Repeat with other leg. Repeat 3 times. Do 2sessions per day.  Copyright  VHI. All rights reserved.   Toe / Heel Raise (Standing)    Standing with support, raise heels, then rock back on heels and raise toes. Repeat 20 times.  Copyright  VHI. All rights reserved.   Plantar Fascia Stretch    Standing with only ball of left foot on stair, push heel down until stretch is felt through arch of foot. Hold 30 seconds. Relax. Repeat 3  times per set. Do 2 sets per day http://orth.exer.us/23   Copyright  VHI. All rights reserved.    Long [employer]    Straighten operated leg and try to hold it 5 seconds. Repeat 15 times. Do 2 sessions a day.  http://gt2.exer.us/311   Copyright  VHI. All rights reserved.

## 2018-10-16 ENCOUNTER — Telehealth (HOSPITAL_COMMUNITY): Payer: Self-pay | Admitting: Physical Therapy

## 2018-10-16 ENCOUNTER — Ambulatory Visit (HOSPITAL_COMMUNITY): Payer: Medicare Other | Admitting: Physical Therapy

## 2018-10-16 ENCOUNTER — Telehealth (HOSPITAL_COMMUNITY): Payer: Self-pay

## 2018-10-16 NOTE — Telephone Encounter (Signed)
L/m Offered pt an apptment for tomorrow @ 8:15 since we had some openings available.

## 2018-10-16 NOTE — Telephone Encounter (Signed)
Called pt re no show.   Pt thought that her appointment was tomorrow.  Apologized and will see [name] Monday.  Rayetta Humphrey, [place] CLT [phone]

## 2018-10-16 NOTE — Telephone Encounter (Signed)
Pt called back and scheduled for tomorrow @10 :15 with Tori.

## 2018-10-17 ENCOUNTER — Encounter (HOSPITAL_COMMUNITY): Payer: Self-pay

## 2018-10-17 ENCOUNTER — Ambulatory Visit (HOSPITAL_COMMUNITY): Payer: Medicare Other

## 2018-10-17 ENCOUNTER — Other Ambulatory Visit: Payer: Self-pay

## 2018-10-17 DIAGNOSIS — R600 Localized edema: Secondary | ICD-10-CM

## 2018-10-17 DIAGNOSIS — R262 Difficulty in walking, not elsewhere classified: Secondary | ICD-10-CM | POA: Diagnosis not present

## 2018-10-17 DIAGNOSIS — M25562 Pain in left knee: Secondary | ICD-10-CM

## 2018-10-17 DIAGNOSIS — R29898 Other symptoms and signs involving the musculoskeletal system: Secondary | ICD-10-CM

## 2018-10-17 DIAGNOSIS — G8918 Other acute postprocedural pain: Secondary | ICD-10-CM

## 2018-10-17 NOTE — Therapy (Signed)
[place], [place], [address] Phone: [phone]   Fax:  [phone]  Physical Therapy Treatment  Patient Details  Name: Linda Woods MRN: [id number] Date of Birth: Apr 21, 1950 Referring Provider (PT): Isac Caddy    Encounter Date: 10/17/2018  PT End of Session - 10/17/18 1105    Visit Number  14    Number of Visits  16    Date for PT Re-Evaluation  11/03/18    Authorization Type  [employer] Medicare (progress note and G-code due visit 10)    Authorization Time Period  09/15/18 to 3/71/06; NEW cert 2/69 to 4/85/46    Authorization - Visit Number  4    Authorization - Number of Visits  10    PT Start Time  1011    PT Stop Time  1052    PT Time Calculation (min)  41 min    Activity Tolerance  Patient tolerated treatment well    Behavior During Therapy  [facility] for tasks assessed/performed       Past Medical History:  Diagnosis Date  . Bursitis   . Chronic reflux esophagitis   . Complication of anesthesia   . Diverticulosis   . GERD (gastroesophageal reflux disease)   . History of kidney stones   . Hypertension   . Knee pain    right knee-seeing ortho  . Osteoarthritis   . Plantar fasciitis   . Pure hypercholesterolemia   . RLS (restless legs syndrome)   . Thyroid disease    hypothyroid    Past Surgical History:  Procedure Laterality Date  . APPENDECTOMY    . CARPAL TUNNEL RELEASE Right 2011   Dr. Amedeo Plenty  . COLPORRHAPHY     posterior  . HAND SURGERY Right 2016   Nerve surgery, Dr. Amedeo Plenty  . OVARIAN CYST REMOVAL    . RECTOCELE REPAIR  2011   w/TVH and sling  . TONSILLECTOMY    . TONSILLECTOMY    . TOTAL KNEE ARTHROPLASTY Left 09/09/2018   Procedure: TOTAL KNEE ARTHROPLASTY, CORTISONE INJECTION RIGHT KNEE;  Surgeon: Paralee Cancel, MD;  Location: [facility];  Service: Orthopedics;  Laterality: Left;  70 mins  . TOTAL VAGINAL HYSTERECTOMY  10/18/2009   rectocele repair, sling  . TUBAL  LIGATION Bilateral   . VARICOSE VEIN SURGERY      There were no vitals filed for this visit.  Subjective Assessment - 10/17/18 1014    Subjective  Pt states she was able to do the stairs with one handrail while carrying items without difficulty. Pt reports knee is "hot and inflammed".    Pertinent History  Lt TKR    Limitations  Sitting;Lifting;Standing;Walking    Patient Stated Goals  get back to PLOF and more, get down on knees.  Ability to transfer from floor to standing    Currently in Pain?  Yes    Pain Score  2     Pain Location  Knee    Pain Orientation  Left    Pain Descriptors / Indicators  Aching;Sore    Pain Type  Surgical pain    Pain Onset  1 to 4 weeks ago    Pain Frequency  Constant    Aggravating Factors   any pressure on back pf thight    Pain Relieving Factors  ice    Effect of Pain on Daily Activities  mild            [facility] Adult PT Treatment/Exercise -  10/17/18 0001      Knee/Hip Exercises: Stretches   Active Hamstring Stretch  Left;3 reps;30 seconds    Active Hamstring Stretch Limitations  12" step    Knee: Self-Stretch to increase Flexion  10 seconds    Knee: Self-Stretch Limitations  10x on 12 inch box     Gastroc Stretch  Both;3 reps;30 seconds    Gastroc Stretch Limitations  slant board       Knee/Hip Exercises: Aerobic   Stationary Bike  5 min, full revolutions, seat 11      Knee/Hip Exercises: Seated   Sit to Sand  2 sets;10 reps;without UE support   eccentric lowering     Knee/Hip Exercises: Supine   Quad Sets  Left;2 sets;10 reps    Heel Slides  15 reps    Heel Slides Limitations  RLE providing overpressure    Straight Leg Raises  Left    Straight Leg Raises Limitations  30 reps    Knee Extension  AROM    Knee Extension Limitations  3    Knee Flexion  AROM    Knee Flexion Limitations  123      Knee/Hip Exercises: Prone   Other Prone Exercises  prone TKE, LLE x10 reps      Manual Therapy   Manual Therapy  Edema management;Soft  tissue mobilization    Manual therapy comments  Manual complete separate than rest of tx    Edema Management  retrograde massage with LE elevated     Soft tissue mobilization  Scar tissue massage to incision to limit restrictions    Myofascial Release  --           PT Short Term Goals - 10/06/18 1036      PT SHORT TERM GOAL #1   Title  Patient to demonstrate improved gait pattern with elimination of antalgic pattern and pain no more than 2/10 with gait    Baseline  8/17- gait pattern improved, pain depends on time of day and can still go up to 6/10    Time  2    Period  Weeks    Status  Partially Met      PT SHORT TERM GOAL #2   Title  Patient to be able to maintain tandem stance for at least 20 seconds bilaterally with no UE support to show improved functional balance    Baseline  8/17- flowsheets    Time  2    Period  Weeks    Status  Achieved      PT SHORT TERM GOAL #3   Title  Patient to be compliant with HEP, to updated PRN    Baseline  8/17- compliant but SLS is very painful    Time  2    Period  Weeks    Status  Achieved        PT Long Term Goals - 10/06/18 1037      PT LONG TERM GOAL #1   Title  L knee ROM to have improved to 0-110 degrees in order to improve gait mechanics and reduce feelings of pain/stiffness    Baseline  8/17- 5-115    Time  4    Period  Weeks    Status  Partially Met      PT LONG TERM GOAL #2   Title  Patient to tolerate gait at least 1554f without increased pain to improve ability to access community    Baseline  8/17- pain limited but can do  it    Time  4    Period  Weeks    Status  Achieved      PT LONG TERM GOAL #3   Title  Patient to demonstrate MMT as being at least 4+/5 in all tested groups in order to improve function and reduce pain    Baseline  8/17- flowsheets    Time  4    Period  Weeks    Status  Partially Met      PT LONG TERM GOAL #4   Title  Patient to be able to reciprocally ascend/descend flight of steps  with B railing and no increase in pain to improve community access    Baseline  8/17- has 2 railings where she needs to do steps    Status  Revised      PT LONG TERM GOAL #5   Title  Patient to be able to kneel on one knee with pain no more than 4/10 in order to improve functional mobility and allow for tasks such as gardening and cleaning    Baseline  8/17- did not attempt    Time  4    Period  Weeks    Status  On-going            Plan - 10/17/18 1104    Clinical Impression Statement  Continued with pt's established POC this date. Initiated treatment session with stationary bicycle to promote circular motion, increasing flexion and extension while decreasing pain. Pt continues to improve L knee AROM achieving 3 to 123 deg this date. Pt continues to report increased pain with extension due to stretching sensation and overall discomfort. Pt with good form performing mat exercises working on quad control and endurance with quad sets and SLR exercises. Pt continues to be limited with gait pattern, decreased speed, lacking full L knee extension in mid stance and slightly decreased weight-bearing through LLE. Educated pt to continue extension exercises at home with rest breaks in comfortable position to assist in decreasing muscle tension and improving overall AROM. Ended with edema management and STM to scar to reduce restrictions. Continue to progress as able.    Personal Factors and Comorbidities  Age;Fitness;Comorbidity 2    Examination-Activity Limitations  Bed Mobility;Stand;Lift;Locomotion Level;Bend;Reach Overhead;Transfers;Carry;Sit;Sleep;Squat;Stairs    Examination-Participation Restrictions  Church;Yard Work;Cleaning;Meal Prep;Community Activity;Driving;Laundry;Shop    Stability/Clinical Decision Making  Stable/Uncomplicated    Rehab Potential  Good    PT Frequency  2x / week    PT Duration  3 weeks    PT Treatment/Interventions  ADLs/Self Care Home Management;Cryotherapy;Electrical  Stimulation;Iontophoresis 69m/ml Dexamethasone;Moist Heat;DME Instruction;Gait training;Stair training;Functional mobility training;Therapeutic activities;Therapeutic exercise;Balance training;Neuromuscular re-education;Patient/family education;Manual techniques;Compression bandaging;Scar mobilization;Passive range of motion;Dry needling;Taping    PT Next Visit Plan  Bike for ROM. Quad and HS manual STM to assist in reduction tension and pain. Focus on advanced functional strength and balance, edema control and scar management. Finalize HEP at d/c.    PT Home Exercise Plan  hospital HEP (quad set, heel slides, ankle pumps, LAQ, supine abd/add) plus focus on supine heel slides and seated flexion overpressure, walking program, edema management program; 7/29: heel prop 1 min; 8/7: LLE HS seated stretch and LLE calf standing stretch; 8/14: SAQ, HS st, SLS; 8/17 removed SLS due to irritablity with exercise, supine and prone knee extension stretches: 10/14/2018: TKE, bridges, heel/toe raises, HS st, plantar fascia stretch and quad stretch, LAQ    Consulted and Agree with Plan of Care  Patient  Patient will benefit from skilled therapeutic intervention in order to improve the following deficits and impairments:  Abnormal gait, Decreased knowledge of use of DME, Decreased skin integrity, Increased fascial restricitons, Impaired sensation, Improper body mechanics, Pain, Decreased coordination, Decreased mobility, Increased muscle spasms, Impaired tone, Decreased activity tolerance, Decreased range of motion, Decreased strength, Hypomobility, Decreased balance, Decreased safety awareness, Difficulty walking, Increased edema  Visit Diagnosis: Acute postoperative pain of left knee  Localized edema  Difficulty in walking, not elsewhere classified  Other symptoms and signs involving the musculoskeletal system     Problem List Patient Active Problem List   Diagnosis Date Noted  . S/P left TKA 09/09/2018   . Status post total left knee replacement 09/09/2018  . Pain of left heel 07/03/2017  . Pain in joint of right hip 05/17/2017  . Benign essential HTN 11/17/2015  . Gastro-esophageal reflux disease without esophagitis 10/12/2014  . Hypertriglyceridemia 10/12/2014  . Hypertension 05/21/2013  . Unspecified hypothyroidism 05/21/2013  . Unspecified vitamin D deficiency 05/21/2013      Talbot Grumbling PT, DPT 10/17/18, 11:07 AM [place] [address] [place], [place], [address] Phone: [phone]   Fax:  [phone]  Name: Linda Woods MRN: [id number] Date of Birth: [birth year]

## 2018-10-20 ENCOUNTER — Other Ambulatory Visit: Payer: Self-pay

## 2018-10-20 ENCOUNTER — Encounter (HOSPITAL_COMMUNITY): Payer: Self-pay

## 2018-10-20 ENCOUNTER — Telehealth (HOSPITAL_COMMUNITY): Payer: Self-pay

## 2018-10-20 ENCOUNTER — Ambulatory Visit (HOSPITAL_COMMUNITY): Payer: Medicare Other

## 2018-10-20 DIAGNOSIS — G8918 Other acute postprocedural pain: Secondary | ICD-10-CM

## 2018-10-20 DIAGNOSIS — R600 Localized edema: Secondary | ICD-10-CM

## 2018-10-20 DIAGNOSIS — M25562 Pain in left knee: Secondary | ICD-10-CM

## 2018-10-20 DIAGNOSIS — R262 Difficulty in walking, not elsewhere classified: Secondary | ICD-10-CM

## 2018-10-20 DIAGNOSIS — Z20822 Contact with and (suspected) exposure to covid-19: Secondary | ICD-10-CM

## 2018-10-20 DIAGNOSIS — R29898 Other symptoms and signs involving the musculoskeletal system: Secondary | ICD-10-CM

## 2018-10-20 NOTE — Telephone Encounter (Signed)
Pt was seen today at 11:30 with Apolonio Schneiders and requested to cx 9/2 and 9/9, she will return on 10/31/2018.

## 2018-10-20 NOTE — Therapy (Signed)
[place], [place], [address] Phone: [phone]   Fax:  [phone]  Physical Therapy Treatment  Patient Details  Name: Linda Woods MRN: [id number] Date of Birth: February 25, 1950 Referring Provider (PT): Isac Caddy    Encounter Date: 10/20/2018  PT End of Session - 10/20/18 1132    Visit Number  14    Number of Visits  26    Date for PT Re-Evaluation  11/03/18    Authorization Type  [employer] Medicare (progress note and G-code due visit 10)    Authorization Time Period  09/15/18 to 8/46/65; NEW cert 9/93 to 5/70/17    Authorization - Visit Number  4    Authorization - Number of Visits  10    PT Start Time  1132    PT Stop Time  1211    PT Time Calculation (min)  39 min    Activity Tolerance  Patient tolerated treatment well    Behavior During Therapy  [facility] for tasks assessed/performed       Past Medical History:  Diagnosis Date  . Bursitis   . Chronic reflux esophagitis   . Complication of anesthesia   . Diverticulosis   . GERD (gastroesophageal reflux disease)   . History of kidney stones   . Hypertension   . Knee pain    right knee-seeing ortho  . Osteoarthritis   . Plantar fasciitis   . Pure hypercholesterolemia   . RLS (restless legs syndrome)   . Thyroid disease    hypothyroid    Past Surgical History:  Procedure Laterality Date  . APPENDECTOMY    . CARPAL TUNNEL RELEASE Right 2011   Dr. Amedeo Plenty  . COLPORRHAPHY     posterior  . HAND SURGERY Right 2016   Nerve surgery, Dr. Amedeo Plenty  . OVARIAN CYST REMOVAL    . RECTOCELE REPAIR  2011   w/TVH and sling  . TONSILLECTOMY    . TONSILLECTOMY    . TOTAL KNEE ARTHROPLASTY Left 09/09/2018   Procedure: TOTAL KNEE ARTHROPLASTY, CORTISONE INJECTION RIGHT KNEE;  Surgeon: Paralee Cancel, MD;  Location: [facility];  Service: Orthopedics;  Laterality: Left;  70 mins  . TOTAL VAGINAL HYSTERECTOMY  10/18/2009   rectocele repair, sling  . TUBAL  LIGATION Bilateral   . VARICOSE VEIN SURGERY      There were no vitals filed for this visit.  Subjective Assessment - 10/20/18 1137    Subjective  Patient reports she had a good trip she is walking more now that she is taking care of two dogs at this time for family.    Pertinent History  Lt TKR    Limitations  Sitting;Lifting;Standing;Walking    Patient Stated Goals  get back to PLOF and more, get down on knees.  Ability to transfer from floor to standing    Currently in Pain?  Yes    Pain Score  2     Pain Location  Knee    Pain Orientation  Left    Pain Descriptors / Indicators  --   little twitch   Pain Type  Surgical pain;Chronic pain    Pain Onset  More than a month ago    Pain Frequency  Intermittent        [facility] Adult PT Treatment/Exercise - 10/20/18 0001      Exercises   Exercises  Other Exercises      Knee/Hip Exercises: Stretches   Active Hamstring Stretch  Left;3 reps;30  seconds    Active Hamstring Stretch Limitations  12"    Passive Hamstring Stretch  Left;3 reps;30 seconds    Passive Hamstring Stretch Limitations  supine with strap    Knee: Self-Stretch to increase Flexion  Left;3 reps;30 seconds   12" box   Gastroc Stretch  Both;3 reps;30 seconds    Gastroc Stretch Limitations  slant board       Knee/Hip Exercises: Aerobic   Stationary Bike  4 min, full revolutions, seat 8      Knee/Hip Exercises: Standing   Terminal Knee Extension  AROM;Left;2 sets;15 reps    Theraband Level (Terminal Knee Extension)  Level 2 (Red)    Terminal Knee Extension Limitations  5" holds    Other Standing Knee Exercises  lateral stepping w/RTB at knees 15 ft B, 2x      Knee/Hip Exercises: Supine   Straight Leg Raises  AROM;Left;15 reps;Strengthening    Knee Extension  AROM    Knee Extension Limitations  5    Knee Flexion  AROM    Knee Flexion Limitations  124      Knee/Hip Exercises: Sidelying   Hip ABduction  AROM;1 set;15 reps;Both      Knee/Hip Exercises: Prone    Straight Leg Raises  AROM;Strengthening;Left;15 reps    Straight Leg Raises Limitations  5" hold    Other Prone Exercises  prone TK L LE 2x10, 5"         PT Education - 10/20/18 1200    Education Details  Educated on exercises throughout session. Consolidated HEP and provided patient with 2 focus exercises moving forward to focus on terminal knee extension.    Person(s) Educated  Patient    Methods  Explanation;Demonstration;Handout    Comprehension  Verbalized understanding;Returned demonstration       PT Short Term Goals - 10/06/18 1036      PT SHORT TERM GOAL #1   Title  Patient to demonstrate improved gait pattern with elimination of antalgic pattern and pain no more than 2/10 with gait    Baseline  8/17- gait pattern improved, pain depends on time of day and can still go up to 6/10    Time  2    Period  Weeks    Status  Partially Met      PT SHORT TERM GOAL #2   Title  Patient to be able to maintain tandem stance for at least 20 seconds bilaterally with no UE support to show improved functional balance    Baseline  8/17- flowsheets    Time  2    Period  Weeks    Status  Achieved      PT SHORT TERM GOAL #3   Title  Patient to be compliant with HEP, to updated PRN    Baseline  8/17- compliant but SLS is very painful    Time  2    Period  Weeks    Status  Achieved        PT Long Term Goals - 10/06/18 1037      PT LONG TERM GOAL #1   Title  L knee ROM to have improved to 0-110 degrees in order to improve gait mechanics and reduce feelings of pain/stiffness    Baseline  8/17- 5-115    Time  4    Period  Weeks    Status  Partially Met      PT LONG TERM GOAL #2   Title  Patient to tolerate gait at least  1580f without increased pain to improve ability to access community    Baseline  8/17- pain limited but can do it    Time  4    Period  Weeks    Status  Achieved      PT LONG TERM GOAL #3   Title  Patient to demonstrate MMT as being at least 4+/5 in all tested  groups in order to improve function and reduce pain    Baseline  8/17- flowsheets    Time  4    Period  Weeks    Status  Partially Met      PT LONG TERM GOAL #4   Title  Patient to be able to reciprocally ascend/descend flight of steps with B railing and no increase in pain to improve community access    Baseline  8/17- has 2 railings where she needs to do steps    Status  Revised      PT LONG TERM GOAL #5   Title  Patient to be able to kneel on one knee with pain no more than 4/10 in order to improve functional mobility and allow for tasks such as gardening and cleaning    Baseline  8/17- did not attempt    Time  4    Period  Weeks    Status  On-going        Plan - 10/20/18 1136    Clinical Impression Statement  Ms. BMauceriis continues to progress steady with ROM and therapy. She remains limited with knee extension and if 5 degrees from full extension today. She was educated throughout session on exercises for hip strength, quad strength, and to facilitate greater knee extension. HEP was consolidated today to focus on knee extension ROM. Anticipate patient will discharge soon with follow up re-assessment next date.    Personal Factors and Comorbidities  Age;Fitness;Comorbidity 2    Examination-Activity Limitations  Bed Mobility;Stand;Lift;Locomotion Level;Bend;Reach Overhead;Transfers;Carry;Sit;Sleep;Squat;Stairs    Examination-Participation Restrictions  Church;Yard Work;Cleaning;Meal Prep;Community Activity;Driving;Laundry;Shop    Stability/Clinical Decision Making  Stable/Uncomplicated    Rehab Potential  Good    PT Frequency  2x / week    PT Duration  3 weeks    PT Treatment/Interventions  ADLs/Self Care Home Management;Cryotherapy;Electrical Stimulation;Iontophoresis 447mml Dexamethasone;Moist Heat;DME Instruction;Gait training;Stair training;Functional mobility training;Therapeutic activities;Therapeutic exercise;Balance training;Neuromuscular re-education;Patient/family  education;Manual techniques;Compression bandaging;Scar mobilization;Passive range of motion;Dry needling;Taping    PT Next Visit Plan  Re-assess with plan to discharge. Review focused HEP and provide advanced exercises if appropriate.    PT Home Exercise Plan  previous HEP walking program, edema management program; LLE calf standing stretch, SAQ, SLS, prone knee extension stretches, TKE, bridges, heel/toe raises, HS st,, plantar fascia stretch and quad stretch, LAQ. 8/31 focus on prone TKE and hs st    Consulted and Agree with Plan of Care  Patient       Patient will benefit from skilled therapeutic intervention in order to improve the following deficits and impairments:  Abnormal gait, Decreased knowledge of use of DME, Decreased skin integrity, Increased fascial restricitons, Impaired sensation, Improper body mechanics, Pain, Decreased coordination, Decreased mobility, Increased muscle spasms, Impaired tone, Decreased activity tolerance, Decreased range of motion, Decreased strength, Hypomobility, Decreased balance, Decreased safety awareness, Difficulty walking, Increased edema  Visit Diagnosis: Acute postoperative pain of left knee  Localized edema  Difficulty in walking, not elsewhere classified  Other symptoms and signs involving the musculoskeletal system     Problem List Patient Active Problem List   Diagnosis Date Noted  .  S/P left TKA 09/09/2018  . Status post total left knee replacement 09/09/2018  . Pain of left heel 07/03/2017  . Pain in joint of right hip 05/17/2017  . Benign essential HTN 11/17/2015  . Gastro-esophageal reflux disease without esophagitis 10/12/2014  . Hypertriglyceridemia 10/12/2014  . Hypertension 05/21/2013  . Unspecified hypothyroidism 05/21/2013  . Unspecified vitamin D deficiency 05/21/2013    Kipp Brood, PT, DPT, [facility] Physical Therapist with [facility]  10/20/2018 12:18 PM    [place], [place], [address] Phone: [phone]   Fax:  [phone]  Name: Linda Woods MRN: [id number] Date of Birth: 1950/06/26

## 2018-10-20 NOTE — Patient Instructions (Signed)
Prone Terminal Knee Extension reps: 10 sets: 3 hold: 5 daily: 1  weekly: 7      Exercise image step 1   Exercise image step 2  Setup  Begin lying on your front with your toes propped on the floor and a slight bend in your knees. Movement  Tighten the muscles in your upper leg to straighten one knee. Hold briefly, then repeat. Tip  Make sure not to arch your back during the exercise. Hooklying Hamstring Stretch with Strap reps: 3 sets: 1 hold: 30 daily: 1  weekly: 7      Exercise image step 1   Exercise image step 2  Setup  Begin lying on your back holding the end of a strap secured around one foot with your legs bent and feet flat on the floor. Movement  Straighten your leg with the strap and pull it toward yourself until you feel a stretch in the back of your thigh and hold. Tip  You can have a slight bend in your knee but keep your foot straight.

## 2018-10-20 NOTE — Telephone Encounter (Signed)
Director said we could still treat this person due to Covid testing for travel and no symptons. Called pt back and she will be tx today at 11:30am.

## 2018-10-20 NOTE — Telephone Encounter (Signed)
SHe was tested for Covid-19 today  Due to traveling to see grandchildren, and did not have test results -will not be seen and will r/s for 9/9

## 2018-10-22 ENCOUNTER — Encounter (HOSPITAL_COMMUNITY): Payer: Medicare Other

## 2018-10-22 ENCOUNTER — Ambulatory Visit (HOSPITAL_COMMUNITY): Payer: Medicare Other

## 2018-10-22 LAB — NOVEL CORONAVIRUS, NAA: SARS-CoV-2, NAA: NOT DETECTED

## 2018-10-29 ENCOUNTER — Encounter (HOSPITAL_COMMUNITY): Payer: Medicare Other

## 2018-10-31 ENCOUNTER — Ambulatory Visit (HOSPITAL_COMMUNITY): Payer: Medicare Other | Attending: Orthopedic Surgery | Admitting: Physical Therapy

## 2018-10-31 ENCOUNTER — Encounter (HOSPITAL_COMMUNITY): Payer: Self-pay | Admitting: Physical Therapy

## 2018-10-31 ENCOUNTER — Other Ambulatory Visit: Payer: Self-pay

## 2018-10-31 DIAGNOSIS — R262 Difficulty in walking, not elsewhere classified: Secondary | ICD-10-CM | POA: Diagnosis present

## 2018-10-31 DIAGNOSIS — R600 Localized edema: Secondary | ICD-10-CM | POA: Diagnosis present

## 2018-10-31 DIAGNOSIS — R29898 Other symptoms and signs involving the musculoskeletal system: Secondary | ICD-10-CM | POA: Insufficient documentation

## 2018-10-31 NOTE — Therapy (Signed)
[facility] [address] [place], [place], [address] Phone: [phone]   Fax:  [phone]  Physical Therapy Treatment  Patient Details  Name: Linda Woods MRN: [id number] Date of Birth: 1950-09-23 Referring Provider (PT): Isac Caddy   PHYSICAL THERAPY DISCHARGE SUMMARY  Visits from Start of Care: 15  Current functional level related to goals / functional outcomes: See below    Remaining deficits: Slight extension lag    Education / Equipment: HEP Plan: Patient agrees to discharge.  Patient goals were not met. Patient is being discharged due to meeting the stated rehab goals.  ?????        Encounter Date: 10/31/2018  PT End of Session - 10/31/18 1604    Visit Number  15    Number of Visits  25   Date for PT Re-Evaluation  11/03/18    Authorization Type  [employer] Medicare (progress note and G-code due visit 10)    Authorization Time Period  09/15/18 to 0/81/44; NEW cert 8/18 to 5/63/14    PT Start Time  1100    PT Stop Time  1130    PT Time Calculation (min)  30 min    Activity Tolerance  Patient tolerated treatment well    Behavior During Therapy  [facility] for tasks assessed/performed       Past Medical History:  Diagnosis Date  . Bursitis   . Chronic reflux esophagitis   . Complication of anesthesia   . Diverticulosis   . GERD (gastroesophageal reflux disease)   . History of kidney stones   . Hypertension   . Knee pain    right knee-seeing ortho  . Osteoarthritis   . Plantar fasciitis   . Pure hypercholesterolemia   . RLS (restless legs syndrome)   . Thyroid disease    hypothyroid    Past Surgical History:  Procedure Laterality Date  . APPENDECTOMY    . CARPAL TUNNEL RELEASE Right 2011   Dr. Amedeo Plenty  . COLPORRHAPHY     posterior  . HAND SURGERY Right 2016   Nerve surgery, Dr. Amedeo Plenty  . OVARIAN CYST REMOVAL    . RECTOCELE REPAIR  2011   w/TVH and sling  . TONSILLECTOMY    . TONSILLECTOMY     . TOTAL KNEE ARTHROPLASTY Left 09/09/2018   Procedure: TOTAL KNEE ARTHROPLASTY, CORTISONE INJECTION RIGHT KNEE;  Surgeon: Paralee Cancel, MD;  Location: [facility];  Service: Orthopedics;  Laterality: Left;  70 mins  . TOTAL VAGINAL HYSTERECTOMY  10/18/2009   rectocele repair, sling  . TUBAL LIGATION Bilateral   . VARICOSE VEIN SURGERY      There were no vitals filed for this visit.  Subjective Assessment - 10/31/18 1059    Subjective  PT states that she fell at her daughers house and her leg is aching a little bit but is fine. States that this is her last visit    Pertinent History  Lt TKR    Limitations  Sitting;Lifting;Standing;Walking    Patient Stated Goals  get back to PLOF and more, get down on knees.  Ability to transfer from floor to standing    Currently in Pain?  Yes    Pain Score  2    from the fall   Pain Location  Knee    Pain Orientation  Right    Pain Descriptors / Indicators  Aching    Pain Onset  More than a month ago  [facility] PT Assessment - 10/31/18 0001      Assessment   Medical Diagnosis  L TKR     Referring Provider (PT)  Rodman [facility]ish/[facility]     Onset Date/Surgical Date  09/09/18    Next MD Visit  September 2nd     Prior Therapy  none       Precautions   Precaution Comments  TKR       Restrictions   Other Position/Activity Restrictions  WBAT       Balance Screen   Has the patient fallen in the past 6 months  Yes    How many times?  2    Has the patient had a decrease in activity level because of a fear of falling?   No    Is the patient reluctant to leave their home because of a fear of falling?   No      Home Film/video editor residence      Prior Function   Level of Independence  Independent      Observation/Other Assessments   Observations  able to maintain tandem stance 20  seconds B       AROM   Left Knee Extension  3   Left Knee Flexion  125     Strength   Right Hip Flexion  5/5    Right Hip  Extension  5/5    Right Hip ABduction  5/5    Left Hip Flexion  5/5    Left Hip Extension  5/5    Left Hip ABduction  5/5    Right Knee Flexion  5/5    Right Knee Extension  5/5    Left Knee Flexion  5/5    Left Knee Extension  5/5    Right Ankle Dorsiflexion  5/5    Left Ankle Dorsiflexion  5/5      Ambulation/Gait   Gait Comments  able to complete steps reciprocally with B railings, S                    [facility] Adult PT Treatment/Exercise - 10/31/18 0001      Knee/Hip Exercises: Stretches   Active Hamstring Stretch  Left;3 reps;30 seconds    Active Hamstring Stretch Limitations  supine      Knee/Hip Exercises: Standing   Heel Raises  Both;10 reps    Terminal Knee Extension  AROM;Left;2 sets;15 reps    Other Standing Knee Exercises  rocker board x 10      Knee/Hip Exercises: Supine   Quad Sets  Left;2 sets;10 reps    Short Arc Quad Sets  Left    Knee Extension  AROM    Knee Extension Limitations  2    Knee Flexion  AROM    Knee Flexion Limitations  125               PT Short Term Goals - 10/31/18 1608      PT SHORT TERM GOAL #1   Title  Patient to demonstrate improved gait pattern with elimination of antalgic pattern and pain no more than 2/10 with gait    Time  2    Period  Weeks    Status  Achieved      PT SHORT TERM GOAL #2   Title  Patient to be able to maintain tandem stance for at least 20 seconds bilaterally with no UE support to show improved functional balance    Baseline  8/17- flowsheets    Time  2    Period  Weeks    Status  Achieved      PT SHORT TERM GOAL #3   Title  Patient to be compliant with HEP, to updated PRN    Baseline  8/17- compliant but SLS is very painful    Time  2    Period  Weeks    Status  Achieved        PT Long Term Goals - 10/31/18 1608      PT LONG TERM GOAL #1   Title  L knee ROM to have improved to 0-110 degrees in order to improve gait mechanics and reduce feelings of pain/stiffness    Baseline   8/17- 3-124    Time  4    Period  Weeks    Status  Partially Met      PT LONG TERM GOAL #2   Title  Patient to tolerate gait at least 1527f without increased pain to improve ability to access community    Baseline  8/17- pain limited but can do it    Time  4    Period  Weeks    Status  Achieved      PT LONG TERM GOAL #3   Title  Patient to demonstrate MMT as being at least 4+/5 in all tested groups in order to improve function and reduce pain    Baseline  8/17- flowsheets    Time  4    Period  Weeks    Status  Achieved      PT LONG TERM GOAL #4   Title  Patient to be able to reciprocally ascend/descend flight of steps with B railing and no increase in pain to improve community access    Baseline  8/17- has 2 railings where she needs to do steps    Status  Achieved      PT LONG TERM GOAL #5   Title  Patient to be able to kneel on one knee with pain no more than 4/10 in order to improve functional mobility and allow for tasks such as gardening and cleaning    Baseline  8/17- did not attempt    Time  4    Period  Weeks    Status  On-going   due to pain on uninvolved knee           Plan - 10/31/18 1605    Clinical Impression Statement  PT comes to the department stating that this is her last treatment.  Pt ROM and strength tested; both are now wfl.  PT encouraged to complete hamstring stretch and quad sets for two more weeks to ensure she does not an extension lag since this was the most difficult thing for the pt to achieve.  Pt limitations at this time are due to her non-involved knee which she feels she will have surgery on next year.    Personal Factors and Comorbidities  Age;Fitness;Comorbidity 2    Examination-Activity Limitations  Bed Mobility;Stand;Lift;Locomotion Level;Bend;Reach Overhead;Transfers;Carry;Sit;Sleep;Squat;Stairs    Examination-Participation Restrictions  Church;Yard Work;Cleaning;Meal Prep;Community Activity;Driving;Laundry;Shop    Stability/Clinical  Decision Making  Stable/Uncomplicated    Rehab Potential  Good    PT Frequency  2x / week    PT Duration  3 weeks    PT Treatment/Interventions  ADLs/Self Care Home Management;Cryotherapy;Electrical Stimulation;Iontophoresis 473mml Dexamethasone;Moist Heat;DME Instruction;Gait training;Stair training;Functional mobility training;Therapeutic activities;Therapeutic exercise;Balance training;Neuromuscular re-education;Patient/family education;Manual techniques;Compression bandaging;Scar mobilization;Passive range of motion;Dry needling;Taping  PT Next Visit Plan  Discharge pt.    PT Home Exercise Plan  previous HEP walking program, edema management program; LLE calf standing stretch, SAQ, SLS, prone knee extension stretches, TKE, bridges, heel/toe raises, HS st,, plantar fascia stretch and quad stretch, LAQ. 8/31 focus on prone TKE and hs st    Consulted and Agree with Plan of Care  Patient       Patient will benefit from skilled therapeutic intervention in order to improve the following deficits and impairments:  Abnormal gait, Decreased knowledge of use of DME, Decreased skin integrity, Increased fascial restricitons, Impaired sensation, Improper body mechanics, Pain, Decreased coordination, Decreased mobility, Increased muscle spasms, Impaired tone, Decreased activity tolerance, Decreased range of motion, Decreased strength, Hypomobility, Decreased balance, Decreased safety awareness, Difficulty walking, Increased edema  Visit Diagnosis: Difficulty in walking, not elsewhere classified  Other symptoms and signs involving the musculoskeletal system  Localized edema     Problem List Patient Active Problem List   Diagnosis Date Noted  . S/P left TKA 09/09/2018  . Status post total left knee replacement 09/09/2018  . Pain of left heel 07/03/2017  . Pain in joint of right hip 05/17/2017  . Benign essential HTN 11/17/2015  . Gastro-esophageal reflux disease without esophagitis 10/12/2014   . Hypertriglyceridemia 10/12/2014  . Hypertension 05/21/2013  . Unspecified hypothyroidism 05/21/2013  . Unspecified vitamin D deficiency 05/21/2013   Rayetta Humphrey, PT CLT [phone] 10/31/2018, 4:11 PM  [facility] [address] [place], [place], [address] Phone: [phone]   Fax:  [phone]  Name: Linda Woods MRN: [id number] Date of Birth: 1950/02/20

## 2018-10-31 NOTE — Patient Instructions (Addendum)
Stretching: Hamstring (Supine)    Supporting left thigh behind knee, slowly straighten knee until stretch is felt in back of thigh. Hold _30___ seconds. Repeat __3__ times per set. Do _1___ sets per session. Do 2____ sessions per day.  http://orth.exer.us/656   Copyright  VHI. All rights reserved.  Strengthening: Quadriceps Set    Tighten muscles on top of thighs by pushing knees down into surface. Hold _5___ seconds. Repeat __10__ times per set. Do __1__ sets per session. Do __2__ sessions per day.  http://orth.exer.us/602   Copyright  VHI. All rights reserved.  Quadriceps Set (Prone)    With toes supporting lower legs, tighten thigh muscles to straighten knees. Hold _3___ seconds. Relax. Repeat __10__ times per set. Do _1___ sets per session. Do __2__ sessions per day.  http://orth.exer.us/726   Copyright  VHI. All rights reserved.

## 2018-11-12 ENCOUNTER — Other Ambulatory Visit: Payer: Self-pay

## 2018-11-12 ENCOUNTER — Ambulatory Visit
Admission: RE | Admit: 2018-11-12 | Discharge: 2018-11-12 | Disposition: A | Discharge status: Home / Self Care | Payer: Medicare Other | Source: Ambulatory Visit | Attending: Obstetrics & Gynecology | Admitting: Obstetrics & Gynecology

## 2018-11-12 DIAGNOSIS — Z1231 Encounter for screening mammogram for malignant neoplasm of breast: Secondary | ICD-10-CM

## 2018-12-09 ENCOUNTER — Other Ambulatory Visit: Payer: Self-pay

## 2018-12-12 ENCOUNTER — Encounter: Payer: Self-pay | Admitting: Obstetrics & Gynecology

## 2018-12-12 ENCOUNTER — Ambulatory Visit (INDEPENDENT_AMBULATORY_CARE_PROVIDER_SITE_OTHER): Payer: Medicare Other | Admitting: Obstetrics & Gynecology

## 2018-12-12 ENCOUNTER — Other Ambulatory Visit: Payer: Self-pay

## 2018-12-12 VITALS — BP 110/72 | HR 88 | Temp 97.8°F | Ht 65.25 in | Wt 172.8 lb

## 2018-12-12 DIAGNOSIS — N3091 Cystitis, unspecified with hematuria: Secondary | ICD-10-CM | POA: Diagnosis not present

## 2018-12-12 DIAGNOSIS — Z01419 Encounter for gynecological examination (general) (routine) without abnormal findings: Secondary | ICD-10-CM | POA: Diagnosis not present

## 2018-12-12 NOTE — Progress Notes (Signed)
68 y.o. [id number] Married White or Caucasian female here for annual exam.  Had left knee replacement with Dr. Alvan Dame.  She feels she is recovering well.  She is having some plantar fasciitis with her left foot.    Reports some blood in her urine this past year.  She had a UTI that she thinks caused this.  Was treated with antibiotics from PCP but didn't see him.  These were just called into the pharmacy.  She would like her urine tested if possible.  PCP:  Dr. Karie Kirks.  She hasn't done blood work this year.  She is going to follow up this year with him and have blood work at that time.  Patient's last menstrual period was 08/21/2002.          Sexually active: No.  The current method of family planning is status post hysterectomy.    Exercising: Yes.    PT, walking Smoker:  no  Health Maintenance: Pap:  03/06/16 neg  History of abnormal Pap:  no MMG:  11/12/18 BIRADS1:neg  Colonoscopy:  06/07/14 f/u 5 years  BMD:   10/25/17 Normal  TDaP:  2011 Pneumonia vaccine(s):  Done  Shingrix:  Had 1 Flu vacc: done  Hep C testing: done  Screening Labs: PCP   reports that she has never smoked. She has never used smokeless tobacco. She reports current alcohol use of about 2.0 - 3.0 standard drinks of alcohol per week. She reports that she does not use drugs.  Past Medical History:  Diagnosis Date  . Bursitis   . Chronic reflux esophagitis   . Complication of anesthesia   . Diverticulosis   . GERD (gastroesophageal reflux disease)   . History of kidney stones   . Hypertension   . Knee pain    right knee-seeing ortho  . Osteoarthritis   . Plantar fasciitis   . Pure hypercholesterolemia   . RLS (restless legs syndrome)   . Thyroid disease    hypothyroid    Past Surgical History:  Procedure Laterality Date  . APPENDECTOMY    . CARPAL TUNNEL RELEASE Right 2011   Dr. Amedeo Plenty  . COLPORRHAPHY     posterior  . HAND SURGERY Right 2016   Nerve surgery, Dr. Amedeo Plenty  . OVARIAN CYST REMOVAL    .  RECTOCELE REPAIR  2011   w/TVH and sling  . TONSILLECTOMY    . TONSILLECTOMY    . TOTAL KNEE ARTHROPLASTY Left 09/09/2018   Procedure: TOTAL KNEE ARTHROPLASTY, CORTISONE INJECTION RIGHT KNEE;  Surgeon: Paralee Cancel, MD;  Location: [facility];  Service: Orthopedics;  Laterality: Left;  70 mins  . TOTAL VAGINAL HYSTERECTOMY  10/18/2009   rectocele repair, sling  . TUBAL LIGATION Bilateral   . VARICOSE VEIN SURGERY      Current Outpatient Medications  Medication Sig Dispense Refill  . diphenoxylate-atropine (LOMOTIL) 2.5-0.025 MG tablet Take 1 tablet by mouth as needed.    . famotidine (PEPCID) 40 MG tablet Take 40 mg by mouth 3 (three) times a week.     Marland Kitchen LORazepam (ATIVAN) 0.5 MG tablet Take 0.5 mg by mouth.    . lovastatin (MEVACOR) 20 MG tablet Take 20 mg by mouth at bedtime.     . metoprolol succinate (TOPROL XL) 50 MG 24 hr tablet Take 50 mg by mouth.    . RABEprazole (ACIPHEX) 20 MG tablet Take 1 tablet (20 mg total) by mouth 2 (two) times a week.    Marland Kitchen SYNTHROID 112 MCG tablet Take  112 mcg by mouth daily before breakfast.      No current facility-administered medications for this visit.     Family History  Problem Relation Age of Onset  . Cervical cancer Mother   . Heart failure Mother   . Heart failure Father   . Diabetes Father   . Cancer Brother   . Breast cancer Maternal Aunt   . Breast cancer Paternal Aunt     Review of Systems  All other systems reviewed and are negative.   Exam:   BP 110/72   Pulse 88   Temp 97.8 F (36.6 C) (Temporal)   Ht 5' 5.25" (1.657 m)   Wt 172 lb 12.8 oz (78.4 kg)   LMP 08/21/2002   BMI 28.54 kg/m     Height: 5' 5.25" (165.7 cm)  Ht Readings from Last 3 Encounters:  12/12/18 5' 5.25" (1.657 m)  09/09/18 5\' 6"  (1.676 m)  09/05/18 5\' 6"  (1.676 m)    General appearance: alert, cooperative and appears stated age Head: Normocephalic, without obvious abnormality, atraumatic Neck: no adenopathy, supple, symmetrical, trachea midline and  thyroid normal to inspection and palpation Lungs: clear to auscultation bilaterally Breasts: normal appearance, no masses or tenderness Heart: regular rate and rhythm Abdomen: soft, non-tender; bowel sounds normal; no masses,  no organomegaly Extremities: extremities normal, atraumatic, no cyanosis or edema Skin: Skin color, texture, turgor normal. No rashes or lesions Lymph nodes: Cervical, supraclavicular, and axillary nodes normal. No abnormal inguinal nodes palpated Neurologic: Grossly normal   Pelvic: External genitalia:  no lesions              Urethra:  normal appearing urethra with no masses, tenderness or lesions              Bartholins and Skenes: normal                 Vagina: normal appearing vagina with normal color and discharge, no lesions              Cervix: absent              Pap taken: No. Bimanual Exam:  Uterus:  uterus absent              Adnexa: no mass, fullness, tenderness               Rectovaginal: Confirms               Anus:  normal sphincter tone, no lesions  Chaperone was present for exam.  A:  Well Woman with normal exam PMP, no HRT H/o TVH and mid-urethral sling for prolapse and rectocele repair 8/11 Hypothyroidism Hypertension Elevated lipids Recent hemorrhagic cystitis  P:   Mammogram guidelines reviewed. pap smear not indicated. BMD and colonoscopy are UTD Urine micro and culture pending Will have lab work with PCP before the end of the year return annually or prn

## 2018-12-13 LAB — URINALYSIS, MICROSCOPIC ONLY: Casts: NONE SEEN /lpf

## 2018-12-14 ENCOUNTER — Encounter: Payer: Self-pay | Admitting: Obstetrics & Gynecology

## 2018-12-14 LAB — URINE CULTURE

## 2018-12-16 ENCOUNTER — Telehealth: Payer: Self-pay | Admitting: *Deleted

## 2018-12-16 ENCOUNTER — Other Ambulatory Visit: Payer: Self-pay | Admitting: Obstetrics & Gynecology

## 2018-12-16 DIAGNOSIS — R3129 Other microscopic hematuria: Secondary | ICD-10-CM

## 2018-12-16 NOTE — Telephone Encounter (Signed)
Call to patient. Results reviewed with patient as seen below from Dr. Sabra Heck and she verbalized understanding. Patient states she is okay to proceed with referral to urology. RN advised would update Dr. Sabra Heck. Patient agreeable.  Routing to provider for review.

## 2018-12-16 NOTE — Telephone Encounter (Signed)
Encounter closed per Dr. Sabra Heck.

## 2018-12-16 NOTE — Telephone Encounter (Signed)
Referral has been placed.  Ok to close encounter.

## 2018-12-16 NOTE — Telephone Encounter (Signed)
-----   Message from Megan Salon, MD sent at 12/14/2018  1:21 PM EDT ----- Please let pt know she did have red cells and white cells in her urine but her culture was negative.  She does need to see urology for additional evaluation of red cells in urine.  This is important so I do not want her to delay in going.  I can make referral if she is ok with this.

## 2018-12-22 ENCOUNTER — Encounter: Payer: Self-pay | Admitting: Obstetrics & Gynecology

## 2018-12-23 ENCOUNTER — Telehealth: Payer: Self-pay | Admitting: Obstetrics & Gynecology

## 2018-12-23 NOTE — Telephone Encounter (Signed)
[facility] about stated sx. [facility] will give pt a call to schedule earlier appt. Routing to provider for final review. Patient is agreeable to disposition. Will close encounter.

## 2018-12-23 NOTE — Telephone Encounter (Signed)
Spoke with Linda Woods. Linda Woods states still having mild to moderate right back pain x 1 week. Urologist referral appt with Dr Matilde Sprang is scheduled for 11/25. Will call [facility] for sooner appt and give a call back to Linda Woods.Linda Woods agrees with plan.

## 2018-12-23 NOTE — Telephone Encounter (Signed)
Spoke with patient she is scheduled 01/14/19 at 9am with Dr. Matilde Sprang.

## 2018-12-23 NOTE — Telephone Encounter (Signed)
Patient sent the following correspondence through [facility].    Dr. Sabra Heck I havent seen or heard of referral to urologist after my last visit that raised mild concerns about red and white blood cells in urine.  I am experiencing periodic back pain in right kidney area but I would not not rate it more than a 5 on pain scale.  Thank you for your continued care of your patients.   Cc: Basilia Jumbo

## 2019-01-20 ENCOUNTER — Other Ambulatory Visit: Payer: Self-pay | Admitting: Unknown Physician Specialty

## 2019-01-20 ENCOUNTER — Telehealth: Payer: Self-pay | Admitting: Unknown Physician Specialty

## 2019-01-20 ENCOUNTER — Other Ambulatory Visit: Payer: Self-pay

## 2019-01-20 DIAGNOSIS — Z20822 Contact with and (suspected) exposure to covid-19: Secondary | ICD-10-CM

## 2019-01-20 DIAGNOSIS — U071 COVID-19: Secondary | ICD-10-CM

## 2019-01-20 NOTE — Telephone Encounter (Signed)
Discussed with patient about Covid symptoms and the use of bamlanivimab, a monoclonal antibody infusion for those with mild to moderate Covid symptoms and at a high risk of hospitalization.  She has had symptoms for 2 days  Pt is qualified for this infusion at the [facility] infusion center due to co-morbid conditions and/or a  member of an at-risk group.

## 2019-01-20 NOTE — Telephone Encounter (Signed)
Scheduled 12/3 0830

## 2019-01-22 ENCOUNTER — Ambulatory Visit (HOSPITAL_COMMUNITY)
Admission: RE | Admit: 2019-01-22 | Discharge: 2019-01-22 | Disposition: A | Discharge status: Home / Self Care | Payer: Medicare Other | Source: Ambulatory Visit | Attending: Critical Care Medicine | Admitting: Critical Care Medicine

## 2019-01-22 DIAGNOSIS — U071 COVID-19: Secondary | ICD-10-CM

## 2019-01-22 DIAGNOSIS — Z23 Encounter for immunization: Secondary | ICD-10-CM | POA: Diagnosis present

## 2019-01-22 LAB — NOVEL CORONAVIRUS, NAA: SARS-CoV-2, NAA: DETECTED — AB

## 2019-01-22 MED ORDER — FAMOTIDINE IN NACL 20-0.9 MG/50ML-% IV SOLN: 20.0000 mg | Freq: Once | INTRAVENOUS | Status: DC | PRN

## 2019-01-22 MED ORDER — EPINEPHRINE 0.3 MG/0.3ML IJ SOAJ: 0.3000 mg | Freq: Once | INTRAMUSCULAR | Status: DC | PRN

## 2019-01-22 MED ORDER — ALBUTEROL SULFATE HFA 108 (90 BASE) MCG/ACT IN AERS: 2.0000 | INHALATION_SPRAY | Freq: Once | RESPIRATORY_TRACT | Status: DC | PRN

## 2019-01-22 MED ORDER — SODIUM CHLORIDE 0.9 % IV SOLN
INTRAVENOUS | Status: DC | PRN
  Administered 2019-01-22: 1000 mL via INTRAVENOUS

## 2019-01-22 MED ORDER — DIPHENHYDRAMINE HCL 50 MG/ML IJ SOLN: 50.0000 mg | Freq: Once | INTRAMUSCULAR | Status: DC | PRN

## 2019-01-22 MED ORDER — METHYLPREDNISOLONE SODIUM SUCC 125 MG IJ SOLR: 125.0000 mg | Freq: Once | INTRAMUSCULAR | Status: DC | PRN

## 2019-01-22 MED ORDER — SODIUM CHLORIDE 0.9 % IV SOLN
700.0000 mg | Freq: Once | INTRAVENOUS | Status: AC
  Administered 2019-01-22: 700 mg via INTRAVENOUS
  Filled 2019-01-22: qty 20

## 2019-01-22 NOTE — Progress Notes (Signed)
Discharged at 1112AM.

## 2019-01-22 NOTE — Progress Notes (Signed)
  Diagnosis: COVID-19  Physician: [name], MD  Procedure: bamlanivimab infusion Provided patient with bamlanivimab fact sheet for patients, parents and caregivers prior to infusion.  Complications: No immediate complications noted.  Discharge: Discharged home   Gaye Alken 01/22/2019

## 2019-09-20 DIAGNOSIS — C801 Malignant (primary) neoplasm, unspecified: Secondary | ICD-10-CM

## 2019-09-20 HISTORY — DX: Malignant (primary) neoplasm, unspecified: C80.1

## 2019-09-21 ENCOUNTER — Other Ambulatory Visit: Payer: Self-pay | Admitting: Nurse Practitioner

## 2019-09-21 ENCOUNTER — Other Ambulatory Visit (HOSPITAL_COMMUNITY): Payer: Self-pay | Admitting: Nurse Practitioner

## 2019-09-21 DIAGNOSIS — R590 Localized enlarged lymph nodes: Secondary | ICD-10-CM

## 2019-10-13 ENCOUNTER — Other Ambulatory Visit: Payer: Self-pay | Admitting: Family Medicine

## 2019-10-13 ENCOUNTER — Other Ambulatory Visit: Payer: Self-pay

## 2019-10-13 ENCOUNTER — Other Ambulatory Visit (HOSPITAL_COMMUNITY): Payer: Self-pay | Admitting: Family Medicine

## 2019-10-13 ENCOUNTER — Ambulatory Visit (HOSPITAL_COMMUNITY)
Admission: RE | Admit: 2019-10-13 | Discharge: 2019-10-13 | Disposition: A | Discharge status: Home / Self Care | Payer: Medicare Other | Source: Ambulatory Visit | Attending: Nurse Practitioner | Admitting: Nurse Practitioner

## 2019-10-13 DIAGNOSIS — R918 Other nonspecific abnormal finding of lung field: Secondary | ICD-10-CM

## 2019-10-13 DIAGNOSIS — R590 Localized enlarged lymph nodes: Secondary | ICD-10-CM | POA: Insufficient documentation

## 2019-10-13 LAB — POCT I-STAT CREATININE: Creatinine, Ser: 0.7 mg/dL (ref 0.44–1.00)

## 2019-10-13 MED ORDER — IOHEXOL 300 MG/ML  SOLN
75.0000 mL | Freq: Once | INTRAMUSCULAR | Status: AC | PRN
  Administered 2019-10-13: 75 mL via INTRAVENOUS

## 2019-10-14 ENCOUNTER — Ambulatory Visit (HOSPITAL_COMMUNITY)
Admission: RE | Admit: 2019-10-14 | Discharge: 2019-10-14 | Disposition: A | Discharge status: Home / Self Care | Payer: Medicare Other | Source: Ambulatory Visit | Attending: Family Medicine | Admitting: Family Medicine

## 2019-10-14 ENCOUNTER — Telehealth: Payer: Self-pay | Admitting: Pulmonary Disease

## 2019-10-14 DIAGNOSIS — K449 Diaphragmatic hernia without obstruction or gangrene: Secondary | ICD-10-CM | POA: Diagnosis not present

## 2019-10-14 DIAGNOSIS — I700 Atherosclerosis of aorta: Secondary | ICD-10-CM | POA: Insufficient documentation

## 2019-10-14 DIAGNOSIS — R918 Other nonspecific abnormal finding of lung field: Secondary | ICD-10-CM | POA: Insufficient documentation

## 2019-10-14 DIAGNOSIS — J90 Pleural effusion, not elsewhere classified: Secondary | ICD-10-CM | POA: Insufficient documentation

## 2019-10-14 MED ORDER — IOHEXOL 300 MG/ML  SOLN
100.0000 mL | Freq: Once | INTRAMUSCULAR | Status: AC | PRN
  Administered 2019-10-14: 100 mL via INTRAVENOUS

## 2019-10-15 NOTE — Telephone Encounter (Signed)
I spoke with the pt and added her on for [place] at 11 am 10/16/19

## 2019-10-15 NOTE — Telephone Encounter (Signed)
Great  Thanks [facility] 10/15/2019 11:02 AM

## 2019-10-15 NOTE — Telephone Encounter (Signed)
PCCM:  I received a call from Dr. Karie Kirks, PCP in [place].   I have spoke with Rob. He can see patient tomorrow morning in clinic.   Please book for tomorrow AM and call patient.   Possible bronch for next Tuesday with RB pending evaluation. I am on nights next week.   Thanks  Fuller Canada, DO [facility] 10/15/2019 7:28 AM

## 2019-10-16 ENCOUNTER — Ambulatory Visit: Payer: Medicare Other | Admitting: Emergency Medicine

## 2019-10-16 ENCOUNTER — Encounter: Payer: Self-pay | Admitting: Emergency Medicine

## 2019-10-16 ENCOUNTER — Other Ambulatory Visit: Payer: Self-pay

## 2019-10-16 VITALS — BP 120/76 | HR 108 | Temp 97.6°F | Ht 65.5 in | Wt 176.2 lb

## 2019-10-16 DIAGNOSIS — R918 Other nonspecific abnormal finding of lung field: Secondary | ICD-10-CM | POA: Diagnosis not present

## 2019-10-16 LAB — CBC WITH DIFFERENTIAL/PLATELET
Basophils Absolute: 0 10*3/uL (ref 0.0–0.1)
Basophils Relative: 0.1 % (ref 0.0–3.0)
Eosinophils Absolute: 0 10*3/uL (ref 0.0–0.7)
Eosinophils Relative: 0 % (ref 0.0–5.0)
HCT: 40.9 % (ref 36.0–46.0)
Hemoglobin: 14.1 g/dL (ref 12.0–15.0)
Lymphocytes Relative: 5.7 % — ABNORMAL LOW (ref 12.0–46.0)
Lymphs Abs: 0.8 10*3/uL (ref 0.7–4.0)
MCHC: 34.6 g/dL (ref 30.0–36.0)
MCV: 91 fl (ref 78.0–100.0)
Monocytes Absolute: 0.4 10*3/uL (ref 0.1–1.0)
Monocytes Relative: 3.1 % (ref 3.0–12.0)
Neutro Abs: 13.3 10*3/uL — ABNORMAL HIGH (ref 1.4–7.7)
Neutrophils Relative %: 91.1 % — ABNORMAL HIGH (ref 43.0–77.0)
Platelets: 314 10*3/uL (ref 150.0–400.0)
RBC: 4.49 Mil/uL (ref 3.87–5.11)
RDW: 12.8 % (ref 11.5–15.5)
WBC: 14.6 10*3/uL — ABNORMAL HIGH (ref 4.0–10.5)

## 2019-10-16 LAB — COMPREHENSIVE METABOLIC PANEL WITH GFR
ALT: 17 U/L (ref 0–35)
AST: 16 U/L (ref 0–37)
Albumin: 4.7 g/dL (ref 3.5–5.2)
Alkaline Phosphatase: 102 U/L (ref 39–117)
BUN: 15 mg/dL (ref 6–23)
CO2: 23 meq/L (ref 19–32)
Calcium: 10.3 mg/dL (ref 8.4–10.5)
Chloride: 100 meq/L (ref 96–112)
Creatinine, Ser: 0.77 mg/dL (ref 0.40–1.20)
GFR: 74.39 mL/min
Glucose, Bld: 177 mg/dL — ABNORMAL HIGH (ref 70–99)
Potassium: 4.1 meq/L (ref 3.5–5.1)
Sodium: 135 meq/L (ref 135–145)
Total Bilirubin: 0.4 mg/dL (ref 0.2–1.2)
Total Protein: 7.4 g/dL (ref 6.0–8.3)

## 2019-10-16 LAB — PROTIME-INR
INR: 1 ratio (ref 0.8–1.0)
Prothrombin Time: 11.5 s (ref 9.6–13.1)

## 2019-10-16 NOTE — Progress Notes (Signed)
Subjective:    Patient ID: Linda Woods, female    DOB: [birth year], 68 y.o.   MRN: [id number]  HPI 69 year old never smoker with a history of GERD, hypertension, hypercholesterolemia, hypothyroidism, rest leg syndrome.  She had COVID-19 in November 2020, received Ab infusion. Vaccinated in March / April.  She is referred today for evaluation of an abnormal CT chest.  She developed some left neck fullness over the last several months (after the COVID vaccine) with evolving pain in her left cheek that prompted a CT scan of her neck on 10/13/2019 which I reviewed, showed an asymmetry in the supraclavicular lymph nodes on the left with some stranding.  Limited visualization of the upper chest showed a partially visible confluent 8 cm solid-appearing spiculated opacity in the left upper lobe.  This prompted CT scan of the chest abdomen and pelvis on 10/14/2019 which I reviewed, confirmed an irregularly shaped 6.9 x 3.7 cm left upper lobe opacity, small left pleural effusion, 1.3 cm right hilar node and a 7 mm periaortic node  Minimal sx, some L chest and throat discomfort in the am. Some thick mucous, mainly seasonal. Never hemoptysis.    Review of Systems As per HPI  Past Medical History:  Diagnosis Date  . Bursitis   . Chronic reflux esophagitis   . Complication of anesthesia   . Diverticulosis   . GERD (gastroesophageal reflux disease)   . History of kidney stones   . Hypertension   . Knee pain    right knee-seeing ortho  . Osteoarthritis   . Plantar fasciitis   . Pure hypercholesterolemia   . RLS (restless legs syndrome)   . Thyroid disease    hypothyroid     Family History  Problem Relation Age of Onset  . Cervical cancer Mother   . Heart failure Mother   . Heart failure Father   . Diabetes Father   . Cancer Brother   . Breast cancer Maternal Aunt   . Breast cancer Paternal Aunt      Social History   Socioeconomic History  . Marital status: Married    Spouse  name: Not on file  . Number of children: 3  . Years of education: Not on file  . Highest education level: Not on file  Occupational History    Employer: SELF EMPLOYED  Tobacco Use  . Smoking status: Never Smoker  . Smokeless tobacco: Never Used  Vaping Use  . Vaping Use: Never used  Substance and Sexual Activity  . Alcohol use: Yes    Alcohol/week: 2.0 - 3.0 standard drinks    Types: 2 - 3 Glasses of wine per week  . Drug use: No  . Sexual activity: Not Currently    Partners: Male    Birth control/protection: Surgical    Comment: Hysterectomy, BTL  Other Topics Concern  . Not on file  Social History Narrative   Married, retired Copywriter, advertising   Caffeine- coffee, 1 cup, occass tea   Education- BS   Children- 3   Social Determinants of Health   Financial Resource Strain:   . Difficulty of Paying Living Expenses: Not on file  Food Insecurity:   . Worried About Charity fundraiser in the Last Year: Not on file  . Ran Out of Food in the Last Year: Not on file  Transportation Needs:   . Lack of Transportation (Medical): Not on file  . Lack of Transportation (Non-Medical): Not on file  Physical Activity:   .  Days of Exercise per Week: Not on file  . Minutes of Exercise per Session: Not on file  Stress:   . Feeling of Stress : Not on file  Social Connections:   . Frequency of Communication with Friends and Family: Not on file  . Frequency of Social Gatherings with Friends and Family: Not on file  . Attends Religious Services: Not on file  . Active Member of Clubs or Organizations: Not on file  . Attends Archivist Meetings: Not on file  . Marital Status: Not on file  Intimate Partner Violence:   . Fear of Current or Ex-Partner: Not on file  . Emotionally Abused: Not on file  . Physically Abused: Not on file  . Sexually Abused: Not on file     Allergies  Allergen Reactions  . Statins     Joint pain     Outpatient Medications Prior to Visit    Medication Sig Dispense Refill  . diphenoxylate-atropine (LOMOTIL) 2.5-0.025 MG tablet Take 1 tablet by mouth as needed.    . famotidine (PEPCID) 40 MG tablet Take 40 mg by mouth 3 (three) times a week.     Marland Kitchen HYDROcodone-acetaminophen (NORCO/VICODIN) 5-325 MG tablet Take 1 tablet by mouth 3 (three) times daily.    Marland Kitchen LORazepam (ATIVAN) 0.5 MG tablet Take 0.5 mg by mouth.    . RABEprazole (ACIPHEX) 20 MG tablet Take 1 tablet (20 mg total) by mouth 2 (two) times a week.    Marland Kitchen SYNTHROID 112 MCG tablet Take 112 mcg by mouth daily before breakfast.     . lovastatin (MEVACOR) 20 MG tablet Take 20 mg by mouth at bedtime.  (Patient not taking: Reported on 10/16/2019)    . metoprolol succinate (TOPROL XL) 50 MG 24 hr tablet Take 50 mg by mouth.     No facility-administered medications prior to visit.        Objective:   Physical Exam Vitals:   10/16/19 1116  BP: 120/76  Pulse: (!) 108  Temp: 97.6 F (36.4 C)  TempSrc: Temporal  SpO2: 96%  Weight: 176 lb 3.2 oz (79.9 kg)  Height: 5' 5.5" (1.664 m)   Gen: Pleasant, well-nourished, in no distress,  normal affect  ENT: No lesions,  mouth clear,  oropharynx clear, no postnasal drip. She has some left supraclavicular fullness that is soft and mobile.  Neck: No JVD, no stridor  Lungs: No use of accessory muscles, no crackles or wheezing on normal respiration, no wheeze on forced expiration  Cardiovascular: RRR, heart sounds normal, no murmur or gallops, no peripheral edema  Musculoskeletal: No deformities, no cyanosis or clubbing.    Neuro: alert, awake, non focal  Skin: Warm, no lesions or rash     Assessment & Plan:  Mass of left lung Left upper lobe mass found after she initiated evaluation for left supraclavicular soft tissue density, lymphadenopathy.  I reviewed all the films and I think the best strategy is for [name] to approach the mass itself by navigational bronchoscopy.  She is not on any anticoagulation.  I reviewed the pros and  cons with her today, all questions answered.  She understands and elects to proceed.  I will try to arrange for next Tuesday.  We reviewed your CT scan of the neck and your CT scan of the chest today. I recommend navigational bronchoscopy to sample your left upper lobe opacity.  We will work on setting this up next week. Lab work today Follow with Dr Lamonte Sakai in  1 month  Baltazar Apo, MD, PhD 10/16/2019, 5:24 PM [facility] [phone] or if no answer [phone]

## 2019-10-16 NOTE — Patient Instructions (Signed)
We reviewed your CT scan of the neck and your CT scan of the chest today. I recommend navigational bronchoscopy to sample your left upper lobe opacity.  We will work on setting this up next week. Lab work today Follow with Dr Lamonte Sakai in 1 month

## 2019-10-16 NOTE — Assessment & Plan Note (Signed)
Left upper lobe mass found after she initiated evaluation for left supraclavicular soft tissue density, lymphadenopathy.  I reviewed all the films and I think the best strategy is for [name] to approach the mass itself by navigational bronchoscopy.  She is not on any anticoagulation.  I reviewed the pros and cons with her today, all questions answered.  She understands and elects to proceed.  I will try to arrange for next Tuesday.  We reviewed your CT scan of the neck and your CT scan of the chest today. I recommend navigational bronchoscopy to sample your left upper lobe opacity.  We will work on setting this up next week. Lab work today Follow with Dr Lamonte Sakai in 1 month

## 2019-10-16 NOTE — H&P (View-Only) (Signed)
Subjective:    Patient ID: Linda Woods, female    DOB: [birth year], 69 y.o.   MRN: [id number]  HPI 69 year old never smoker with a history of GERD, hypertension, hypercholesterolemia, hypothyroidism, rest leg syndrome.  She had COVID-19 in November 2020, received Ab infusion. Vaccinated in March / April.  She is referred today for evaluation of an abnormal CT chest.  She developed some left neck fullness over the last several months (after the COVID vaccine) with evolving pain in her left cheek that prompted a CT scan of her neck on 10/13/2019 which I reviewed, showed an asymmetry in the supraclavicular lymph nodes on the left with some stranding.  Limited visualization of the upper chest showed a partially visible confluent 8 cm solid-appearing spiculated opacity in the left upper lobe.  This prompted CT scan of the chest abdomen and pelvis on 10/14/2019 which I reviewed, confirmed an irregularly shaped 6.9 x 3.7 cm left upper lobe opacity, small left pleural effusion, 1.3 cm right hilar node and a 7 mm periaortic node  Minimal sx, some L chest and throat discomfort in the am. Some thick mucous, mainly seasonal. Never hemoptysis.    Review of Systems As per HPI  Past Medical History:  Diagnosis Date  . Bursitis   . Chronic reflux esophagitis   . Complication of anesthesia   . Diverticulosis   . GERD (gastroesophageal reflux disease)   . History of kidney stones   . Hypertension   . Knee pain    right knee-seeing ortho  . Osteoarthritis   . Plantar fasciitis   . Pure hypercholesterolemia   . RLS (restless legs syndrome)   . Thyroid disease    hypothyroid     Family History  Problem Relation Age of Onset  . Cervical cancer Mother   . Heart failure Mother   . Heart failure Father   . Diabetes Father   . Cancer Brother   . Breast cancer Maternal Aunt   . Breast cancer Paternal Aunt      Social History   Socioeconomic History  . Marital status: Married    Spouse  name: Not on file  . Number of children: 3  . Years of education: Not on file  . Highest education level: Not on file  Occupational History    Employer: SELF EMPLOYED  Tobacco Use  . Smoking status: Never Smoker  . Smokeless tobacco: Never Used  Vaping Use  . Vaping Use: Never used  Substance and Sexual Activity  . Alcohol use: Yes    Alcohol/week: 2.0 - 3.0 standard drinks    Types: 2 - 3 Glasses of wine per week  . Drug use: No  . Sexual activity: Not Currently    Partners: Male    Birth control/protection: Surgical    Comment: Hysterectomy, BTL  Other Topics Concern  . Not on file  Social History Narrative   Married, retired Copywriter, advertising   Caffeine- coffee, 1 cup, occass tea   Education- BS   Children- 3   Social Determinants of Health   Financial Resource Strain:   . Difficulty of Paying Living Expenses: Not on file  Food Insecurity:   . Worried About Charity fundraiser in the Last Year: Not on file  . Ran Out of Food in the Last Year: Not on file  Transportation Needs:   . Lack of Transportation (Medical): Not on file  . Lack of Transportation (Non-Medical): Not on file  Physical Activity:   .  Days of Exercise per Week: Not on file  . Minutes of Exercise per Session: Not on file  Stress:   . Feeling of Stress : Not on file  Social Connections:   . Frequency of Communication with Friends and Family: Not on file  . Frequency of Social Gatherings with Friends and Family: Not on file  . Attends Religious Services: Not on file  . Active Member of Clubs or Organizations: Not on file  . Attends Archivist Meetings: Not on file  . Marital Status: Not on file  Intimate Partner Violence:   . Fear of Current or Ex-Partner: Not on file  . Emotionally Abused: Not on file  . Physically Abused: Not on file  . Sexually Abused: Not on file     Allergies  Allergen Reactions  . Statins     Joint pain     Outpatient Medications Prior to Visit   Medication Sig Dispense Refill  . diphenoxylate-atropine (LOMOTIL) 2.5-0.025 MG tablet Take 1 tablet by mouth as needed.    . famotidine (PEPCID) 40 MG tablet Take 40 mg by mouth 3 (three) times a week.     Marland Kitchen HYDROcodone-acetaminophen (NORCO/VICODIN) 5-325 MG tablet Take 1 tablet by mouth 3 (three) times daily.    Marland Kitchen LORazepam (ATIVAN) 0.5 MG tablet Take 0.5 mg by mouth.    . RABEprazole (ACIPHEX) 20 MG tablet Take 1 tablet (20 mg total) by mouth 2 (two) times a week.    Marland Kitchen SYNTHROID 112 MCG tablet Take 112 mcg by mouth daily before breakfast.     . lovastatin (MEVACOR) 20 MG tablet Take 20 mg by mouth at bedtime.  (Patient not taking: Reported on 10/16/2019)    . metoprolol succinate (TOPROL XL) 50 MG 24 hr tablet Take 50 mg by mouth.     No facility-administered medications prior to visit.        Objective:   Physical Exam Vitals:   10/16/19 1116  BP: 120/76  Pulse: (!) 108  Temp: 97.6 F (36.4 C)  TempSrc: Temporal  SpO2: 96%  Weight: 176 lb 3.2 oz (79.9 kg)  Height: 5' 5.5" (1.664 m)   Gen: Pleasant, well-nourished, in no distress,  normal affect  ENT: No lesions,  mouth clear,  oropharynx clear, no postnasal drip. She has some left supraclavicular fullness that is soft and mobile.  Neck: No JVD, no stridor  Lungs: No use of accessory muscles, no crackles or wheezing on normal respiration, no wheeze on forced expiration  Cardiovascular: RRR, heart sounds normal, no murmur or gallops, no peripheral edema  Musculoskeletal: No deformities, no cyanosis or clubbing.    Neuro: alert, awake, non focal  Skin: Warm, no lesions or rash     Assessment & Plan:  Mass of left lung Left upper lobe mass found after she initiated evaluation for left supraclavicular soft tissue density, lymphadenopathy.  I reviewed all the films and I think the best strategy is for [name] to approach the mass itself by navigational bronchoscopy.  She is not on any anticoagulation.  I reviewed the pros and  cons with her today, all questions answered.  She understands and elects to proceed.  I will try to arrange for next Tuesday.  We reviewed your CT scan of the neck and your CT scan of the chest today. I recommend navigational bronchoscopy to sample your left upper lobe opacity.  We will work on setting this up next week. Lab work today Follow with Dr Lamonte Sakai in 1  month  Baltazar Apo, MD, PhD 10/16/2019, 5:24 PM [facility] [phone] or if no answer [phone]

## 2019-10-19 ENCOUNTER — Other Ambulatory Visit (HOSPITAL_COMMUNITY)
Admission: RE | Admit: 2019-10-19 | Discharge: 2019-10-19 | Disposition: A | Discharge status: Home / Self Care | Payer: Medicare Other | Source: Ambulatory Visit | Attending: Emergency Medicine | Admitting: Emergency Medicine

## 2019-10-19 ENCOUNTER — Other Ambulatory Visit: Payer: Self-pay

## 2019-10-19 ENCOUNTER — Encounter (HOSPITAL_COMMUNITY): Payer: Self-pay | Admitting: Emergency Medicine

## 2019-10-19 DIAGNOSIS — Z20822 Contact with and (suspected) exposure to covid-19: Secondary | ICD-10-CM | POA: Insufficient documentation

## 2019-10-19 DIAGNOSIS — Z01812 Encounter for preprocedural laboratory examination: Secondary | ICD-10-CM | POA: Insufficient documentation

## 2019-10-19 LAB — SARS CORONAVIRUS 2 (TAT 6-24 HRS): SARS Coronavirus 2: NEGATIVE

## 2019-10-19 NOTE — Progress Notes (Signed)
Anesthesia Chart Review: Linda Woods   Case: [id number] Date/Time: 10/20/19 1130   Procedure: VIDEO BRONCHOSCOPY WITH ENDOBRONCHIAL NAVIGATION (N/A )   Anesthesia type: General   Pre-op diagnosis: lung mass LUL   Location: [facility] ROOM 2 / [facility]   Surgeons: Collene Gobble, MD      DISCUSSION: Patient is a 69 year old female scheduled for the above procedure. She was referred to pulmonology by her PCP Dr. Karie Kirks last week for finding of LUL lung mass on CT that was ordered for evaluation of "neck swelling" (left supraclavicular soft tissue density, lymphadenopathy).  Other history includes never smoker, post-operative N/V, hypothyroidism, hypercholesterolemia, HTN, GERD, anxiety. + COVID-19 test 01/20/19 (s/p bamlanivimab infusion). No reported DM history  She is s/p left TKA on 09/09/18. EKG for that procedure showed negative anterior T waves which was not felt significantly changed when compared to 07/22/14 EKG received from [facility] (scanned under Media tab, Correspondence, 09/09/18). Her 09/09/18 EKG was previously reviewed with an anesthesiologist (see 09/05/18 note by Willeen Cass, NP). This EKG is now > 69 year old, so if she has not had a more recent tracing then would anticipate updating her EKG on arrival.   10/19/2019 presurgical COVID-19 test is in process.  She is a same-day work-up so further evaluation on the day of surgery by her anesthesia team. She had a CBC and CMET on 10/16/19.    VS: LMP 08/21/2002 . On 10/16/19: BP: 120/76  Pulse: (!) 108  Temp: 97.6 F (36.4 C)  TempSrc: Temporal  SpO2: 96%  Weight: 176 lb 3.2 oz (79.9 kg)  Height: 5' 5.5" (1.664 m)    PROVIDERS: [name], MD is PCP  Arnette Schaumann, MD is urologist   LABS: 10/16/19 lab results include: Lab Results  Component Value Date   WBC 14.6 (H) 10/16/2019   HGB 14.1 10/16/2019   HCT 40.9 10/16/2019   PLT 314.0 10/16/2019   GLUCOSE 177 (H) 10/16/2019   ALT 17 10/16/2019    AST 16 10/16/2019   NA 135 10/16/2019   K 4.1 10/16/2019   CL 100 10/16/2019   CREATININE 0.77 10/16/2019   BUN 15 10/16/2019   CO2 23 10/16/2019   INR 1.0 10/16/2019     IMAGES: CT Abd/pelvis 10/14/19: IMPRESSION: 1. Left upper lobe lung mass, suspicious for primary bronchogenic carcinoma. Surrounding interstitial thickening is suspicious for lymphangitic tumor spread. Given morphology (including surrounding septal thickening), and lack of smoking history, pneumonia is possible but felt less likely. If the patient has infectious symptoms, consider antibiotic therapy and short-term CT follow-up. If not, consider multidisciplinary thoracic oncology consultation for tissue sampling and possible PET. 2. Prominent but not pathologically sized thoracic nodes, indeterminate. 3. Small left pleural effusion. 4. No findings of abdominopelvic metastatic disease. 5. A right adrenal nodule is technically indeterminate, but stable compared to a CT of 01/07/2019, favoring a benign etiology. 6.  Tiny hiatal hernia. 7.  Aortic Atherosclerosis (ICD10-I70.0). 8. Left femoral head avascular necrosis.   EKG: History of anterior T wave inversion. See DISCUSSION. Currently, last known EKG is > 52 year old.    CV: N/A  Past Medical History:  Diagnosis Date  . Anemia    as a child  . Anxiety   . Bursitis   . Chronic reflux esophagitis   . Complication of anesthesia   . Diarrhea, functional   . Diverticulosis   . GERD (gastroesophageal reflux disease)   . History of kidney stones   .  Hypertension   . Knee pain    right knee-seeing ortho  . Osteoarthritis   . Plantar fasciitis   . PONV (postoperative nausea and vomiting)    has used the patch before and that helps  . Pure hypercholesterolemia   . RLS (restless legs syndrome)   . Thyroid disease    hypothyroid    Past Surgical History:  Procedure Laterality Date  . APPENDECTOMY    . CARPAL TUNNEL RELEASE Right 2011   Dr. Amedeo Plenty   . COLPORRHAPHY     posterior  . HAND SURGERY Right 2016   Nerve surgery, Dr. Amedeo Plenty  . OVARIAN CYST REMOVAL    . RECTOCELE REPAIR  2011   w/TVH and sling  . TONSILLECTOMY    . TONSILLECTOMY    . TOTAL KNEE ARTHROPLASTY Left 09/09/2018   Procedure: TOTAL KNEE ARTHROPLASTY, CORTISONE INJECTION RIGHT KNEE;  Surgeon: Paralee Cancel, MD;  Location: [facility];  Service: Orthopedics;  Laterality: Left;  70 mins  . TOTAL VAGINAL HYSTERECTOMY  10/18/2009   rectocele repair, sling  . TUBAL LIGATION Bilateral   . VARICOSE VEIN SURGERY      MEDICATIONS: No current facility-administered medications for this encounter.   . diphenoxylate-atropine (LOMOTIL) 2.5-0.025 MG tablet  . famotidine (PEPCID) 40 MG tablet  . fexofenadine (ALLEGRA) 180 MG tablet  . fluticasone (FLONASE) 50 MCG/ACT nasal spray  . HYDROcodone-acetaminophen (NORCO/VICODIN) 5-325 MG tablet  . HYDROcodone-homatropine (HYCODAN) 5-1.5 MG/5ML syrup  . ibuprofen (ADVIL) 200 MG tablet  . LORazepam (ATIVAN) 0.5 MG tablet  . melatonin 5 MG TABS  . metoprolol succinate (TOPROL XL) 50 MG 24 hr tablet  . oxymetazoline (AFRIN) 0.05 % nasal spray  . RABEprazole (ACIPHEX) 20 MG tablet  . SYNTHROID 112 MCG tablet    Myra Gianotti, PA-C Surgical Short Stay/Anesthesiology [facility] Phone [phone] [facility] Phone [phone] 10/19/2019 3:18 PM

## 2019-10-19 NOTE — Progress Notes (Signed)
Spoke with pt for pre-op call. Pt denies cardiac history and Diabetes. Pt is treated for HTN.   Covid test done today. Pt states she's been in quarantine since the test was done and will stay in it until she comes to the hospital tomorrow.

## 2019-10-19 NOTE — Anesthesia Preprocedure Evaluation (Addendum)
Anesthesia Evaluation  Patient identified by MRN, date of birth, ID band Patient awake    Reviewed: Allergy & Precautions, NPO status , Patient's Chart, lab work & pertinent test results  History of Anesthesia Complications (+) PONV  Airway Mallampati: II  TM Distance: >3 FB     Dental   Pulmonary  History noted CG   breath sounds clear to auscultation       Cardiovascular hypertension,  Rhythm:Regular Rate:Normal     Neuro/Psych Anxiety    GI/Hepatic Neg liver ROS,   Endo/Other  Hypothyroidism   Renal/GU negative Renal ROS     Musculoskeletal  (+) Arthritis ,   Abdominal   Peds  Hematology  (+) anemia ,   Anesthesia Other Findings   Reproductive/Obstetrics                             Anesthesia Physical Anesthesia Plan  ASA: III  Anesthesia Plan: General   Post-op Pain Management:    Induction: Intravenous  PONV Risk Score and Plan: 4 or greater and Ondansetron, Dexamethasone and Midazolam  Airway Management Planned: Oral ETT  Additional Equipment:   Intra-op Plan:   Post-operative Plan: Possible Post-op intubation/ventilation  Informed Consent: I have reviewed the patients History and Physical, chart, labs and discussed the procedure including the risks, benefits and alternatives for the proposed anesthesia with the patient or authorized representative who has indicated his/her understanding and acceptance.     Dental advisory given  Plan Discussed with: Anesthesiologist and CRNA  Anesthesia Plan Comments: (PAT note written 10/19/2019 by Myra Gianotti, PA-C. )       Anesthesia Quick Evaluation

## 2019-10-20 ENCOUNTER — Other Ambulatory Visit: Payer: Self-pay

## 2019-10-20 ENCOUNTER — Encounter (HOSPITAL_COMMUNITY): Payer: Self-pay | Admitting: Emergency Medicine

## 2019-10-20 ENCOUNTER — Observation Stay (HOSPITAL_COMMUNITY): Payer: Medicare Other

## 2019-10-20 ENCOUNTER — Ambulatory Visit (HOSPITAL_COMMUNITY): Payer: Medicare Other | Admitting: Vascular Surgery

## 2019-10-20 ENCOUNTER — Ambulatory Visit (HOSPITAL_COMMUNITY): Payer: Medicare Other

## 2019-10-20 ENCOUNTER — Encounter (HOSPITAL_COMMUNITY)
Admission: AD | Disposition: A | Discharge status: Home / Self Care | Payer: Self-pay | Source: Home / Self Care | Attending: Emergency Medicine

## 2019-10-20 ENCOUNTER — Inpatient Hospital Stay (HOSPITAL_COMMUNITY)
Admission: AD | Admit: 2019-10-20 | Discharge: 2019-10-23 | DRG: 167 | Disposition: A | Discharge status: Home / Self Care | Payer: Medicare Other | Attending: Emergency Medicine | Admitting: Emergency Medicine

## 2019-10-20 DIAGNOSIS — Z79899 Other long term (current) drug therapy: Secondary | ICD-10-CM

## 2019-10-20 DIAGNOSIS — Z20822 Contact with and (suspected) exposure to covid-19: Secondary | ICD-10-CM | POA: Diagnosis present

## 2019-10-20 DIAGNOSIS — E785 Hyperlipidemia, unspecified: Secondary | ICD-10-CM | POA: Diagnosis present

## 2019-10-20 DIAGNOSIS — F419 Anxiety disorder, unspecified: Secondary | ICD-10-CM | POA: Diagnosis present

## 2019-10-20 DIAGNOSIS — K5790 Diverticulosis of intestine, part unspecified, without perforation or abscess without bleeding: Secondary | ICD-10-CM | POA: Diagnosis present

## 2019-10-20 DIAGNOSIS — R911 Solitary pulmonary nodule: Secondary | ICD-10-CM

## 2019-10-20 DIAGNOSIS — R918 Other nonspecific abnormal finding of lung field: Secondary | ICD-10-CM | POA: Diagnosis not present

## 2019-10-20 DIAGNOSIS — Z8249 Family history of ischemic heart disease and other diseases of the circulatory system: Secondary | ICD-10-CM

## 2019-10-20 DIAGNOSIS — Z808 Family history of malignant neoplasm of other organs or systems: Secondary | ICD-10-CM

## 2019-10-20 DIAGNOSIS — K219 Gastro-esophageal reflux disease without esophagitis: Secondary | ICD-10-CM | POA: Diagnosis present

## 2019-10-20 DIAGNOSIS — Z4682 Encounter for fitting and adjustment of non-vascular catheter: Secondary | ICD-10-CM

## 2019-10-20 DIAGNOSIS — C3412 Malignant neoplasm of upper lobe, left bronchus or lung: Secondary | ICD-10-CM | POA: Diagnosis present

## 2019-10-20 DIAGNOSIS — J939 Pneumothorax, unspecified: Secondary | ICD-10-CM | POA: Diagnosis not present

## 2019-10-20 DIAGNOSIS — Z8616 Personal history of COVID-19: Secondary | ICD-10-CM

## 2019-10-20 DIAGNOSIS — E039 Hypothyroidism, unspecified: Secondary | ICD-10-CM | POA: Diagnosis present

## 2019-10-20 DIAGNOSIS — J942 Hemothorax: Secondary | ICD-10-CM

## 2019-10-20 DIAGNOSIS — Z9889 Other specified postprocedural states: Secondary | ICD-10-CM

## 2019-10-20 DIAGNOSIS — Z888 Allergy status to other drugs, medicaments and biological substances status: Secondary | ICD-10-CM

## 2019-10-20 DIAGNOSIS — Z96652 Presence of left artificial knee joint: Secondary | ICD-10-CM | POA: Diagnosis present

## 2019-10-20 DIAGNOSIS — E7800 Pure hypercholesterolemia, unspecified: Secondary | ICD-10-CM | POA: Diagnosis present

## 2019-10-20 DIAGNOSIS — G2581 Restless legs syndrome: Secondary | ICD-10-CM | POA: Diagnosis present

## 2019-10-20 DIAGNOSIS — Z803 Family history of malignant neoplasm of breast: Secondary | ICD-10-CM

## 2019-10-20 DIAGNOSIS — Z79890 Hormone replacement therapy (postmenopausal): Secondary | ICD-10-CM

## 2019-10-20 DIAGNOSIS — Z8049 Family history of malignant neoplasm of other genital organs: Secondary | ICD-10-CM

## 2019-10-20 DIAGNOSIS — J95811 Postprocedural pneumothorax: Principal | ICD-10-CM | POA: Diagnosis present

## 2019-10-20 DIAGNOSIS — I10 Essential (primary) hypertension: Secondary | ICD-10-CM | POA: Diagnosis present

## 2019-10-20 DIAGNOSIS — K2100 Gastro-esophageal reflux disease with esophagitis, without bleeding: Secondary | ICD-10-CM | POA: Diagnosis present

## 2019-10-20 DIAGNOSIS — Z833 Family history of diabetes mellitus: Secondary | ICD-10-CM

## 2019-10-20 HISTORY — DX: Anemia, unspecified: D64.9

## 2019-10-20 HISTORY — PX: BRONCHIAL WASHINGS: SHX5105

## 2019-10-20 HISTORY — DX: Other specified postprocedural states: Z98.890

## 2019-10-20 HISTORY — PX: BRONCHIAL BIOPSY: SHX5109

## 2019-10-20 HISTORY — DX: Functional diarrhea: K59.1

## 2019-10-20 HISTORY — PX: BRONCHIAL BRUSHINGS: SHX5108

## 2019-10-20 HISTORY — DX: Nausea with vomiting, unspecified: R11.2

## 2019-10-20 HISTORY — PX: VIDEO BRONCHOSCOPY WITH ENDOBRONCHIAL NAVIGATION: SHX6175

## 2019-10-20 HISTORY — DX: Acute embolism and thrombosis of other specified veins: I82.890

## 2019-10-20 HISTORY — PX: BRONCHIAL NEEDLE ASPIRATION BIOPSY: SHX5106

## 2019-10-20 HISTORY — DX: Anxiety disorder, unspecified: F41.9

## 2019-10-20 SURGERY — VIDEO BRONCHOSCOPY WITH ENDOBRONCHIAL NAVIGATION
Anesthesia: General

## 2019-10-20 MED ORDER — FAMOTIDINE 20 MG PO TABS
40.0000 mg | ORAL_TABLET | ORAL | Status: DC
  Administered 2019-10-21 – 2019-10-23 (×3): 40 mg via ORAL
  Filled 2019-10-20: qty 2
  Filled 2019-10-20: qty 1
  Filled 2019-10-20 (×2): qty 2

## 2019-10-20 MED ORDER — LIDOCAINE 2% (20 MG/ML) 5 ML SYRINGE
INTRAMUSCULAR | Status: DC | PRN
  Administered 2019-10-20: 60 mg via INTRAVENOUS

## 2019-10-20 MED ORDER — MELATONIN 5 MG PO TABS
5.0000 mg | ORAL_TABLET | Freq: Every day | ORAL | Status: DC
  Administered 2019-10-20 – 2019-10-22 (×3): 5 mg via ORAL
  Filled 2019-10-20 (×3): qty 1

## 2019-10-20 MED ORDER — SUGAMMADEX SODIUM 200 MG/2ML IV SOLN
INTRAVENOUS | Status: DC | PRN
  Administered 2019-10-20: 200 mg via INTRAVENOUS

## 2019-10-20 MED ORDER — HYDROCOD POLST-CPM POLST ER 10-8 MG/5ML PO SUER
5.0000 mL | Freq: Two times a day (BID) | ORAL | Status: DC | PRN
  Administered 2019-10-20 – 2019-10-23 (×4): 5 mL via ORAL
  Filled 2019-10-20 (×4): qty 5

## 2019-10-20 MED ORDER — LORAZEPAM 0.5 MG PO TABS
0.5000 mg | ORAL_TABLET | Freq: Once | ORAL | Status: AC
  Administered 2019-10-20: 0.5 mg via ORAL
  Filled 2019-10-20: qty 1

## 2019-10-20 MED ORDER — DEXAMETHASONE SODIUM PHOSPHATE 10 MG/ML IJ SOLN
INTRAMUSCULAR | Status: DC | PRN
  Administered 2019-10-20: 5 mg via INTRAVENOUS

## 2019-10-20 MED ORDER — ESMOLOL HCL 100 MG/10ML IV SOLN
INTRAVENOUS | Status: DC | PRN
  Administered 2019-10-20 (×2): 20 mg via INTRAVENOUS

## 2019-10-20 MED ORDER — HYDROCODONE-ACETAMINOPHEN 5-325 MG PO TABS
0.5000 | ORAL_TABLET | Freq: Every day | ORAL | Status: DC | PRN
  Administered 2019-10-20: 0.5 via ORAL
  Filled 2019-10-20: qty 1

## 2019-10-20 MED ORDER — FENTANYL CITRATE (PF) 100 MCG/2ML IJ SOLN
INTRAMUSCULAR | Status: DC | PRN
Start: 2019-10-20 — End: 2019-10-20
  Administered 2019-10-20 (×2): 50 ug via INTRAVENOUS
  Administered 2019-10-20: 100 ug via INTRAVENOUS

## 2019-10-20 MED ORDER — LORAZEPAM 0.5 MG PO TABS
0.5000 mg | ORAL_TABLET | Freq: Every day | ORAL | Status: DC
  Administered 2019-10-20 – 2019-10-22 (×3): 0.5 mg via ORAL
  Filled 2019-10-20 (×3): qty 1

## 2019-10-20 MED ORDER — ROCURONIUM BROMIDE 10 MG/ML (PF) SYRINGE
PREFILLED_SYRINGE | INTRAVENOUS | Status: DC | PRN
  Administered 2019-10-20: 20 mg via INTRAVENOUS
  Administered 2019-10-20: 40 mg via INTRAVENOUS

## 2019-10-20 MED ORDER — ONDANSETRON HCL 4 MG/2ML IJ SOLN
INTRAMUSCULAR | Status: DC | PRN
  Administered 2019-10-20: 4 mg via INTRAVENOUS

## 2019-10-20 MED ORDER — LACTATED RINGERS IV SOLN: INTRAVENOUS | Status: DC | PRN

## 2019-10-20 MED ORDER — FENTANYL CITRATE (PF) 100 MCG/2ML IJ SOLN: 25.0000 ug | INTRAMUSCULAR | Status: DC | PRN

## 2019-10-20 MED ORDER — DIPHENOXYLATE-ATROPINE 2.5-0.025 MG PO TABS: 1.0000 | ORAL_TABLET | Freq: Every day | ORAL | Status: DC | PRN

## 2019-10-20 MED ORDER — SODIUM CHLORIDE 0.9% FLUSH
10.0000 mL | Freq: Three times a day (TID) | INTRAVENOUS | Status: DC
  Administered 2019-10-21 – 2019-10-23 (×8): 10 mL

## 2019-10-20 MED ORDER — KETOROLAC TROMETHAMINE 30 MG/ML IJ SOLN
15.0000 mg | Freq: Once | INTRAMUSCULAR | Status: AC
  Administered 2019-10-21: 15 mg via INTRAVENOUS
  Filled 2019-10-20: qty 1

## 2019-10-20 MED ORDER — LEVOTHYROXINE SODIUM 112 MCG PO TABS
112.0000 ug | ORAL_TABLET | Freq: Every day | ORAL | Status: DC
  Administered 2019-10-21 – 2019-10-23 (×3): 112 ug via ORAL
  Filled 2019-10-20 (×3): qty 1

## 2019-10-20 MED ORDER — CHLORHEXIDINE GLUCONATE 0.12 % MT SOLN
OROMUCOSAL | Status: AC
  Filled 2019-10-20: qty 15

## 2019-10-20 MED ORDER — PROPOFOL 10 MG/ML IV BOLUS
INTRAVENOUS | Status: DC | PRN
  Administered 2019-10-20: 150 mg via INTRAVENOUS

## 2019-10-20 MED ORDER — KETOROLAC TROMETHAMINE 15 MG/ML IJ SOLN
INTRAMUSCULAR | Status: AC
  Administered 2019-10-20: 15 mg
  Filled 2019-10-20: qty 1

## 2019-10-20 MED ORDER — MIDAZOLAM HCL 5 MG/5ML IJ SOLN
INTRAMUSCULAR | Status: DC | PRN
  Administered 2019-10-20: 2 mg via INTRAVENOUS

## 2019-10-20 MED ORDER — LACTATED RINGERS IV SOLN: INTRAVENOUS | Status: DC

## 2019-10-20 NOTE — Op Note (Signed)
Video Bronchoscopy with Electromagnetic Navigation Procedure Note  Date of Operation: 10/20/2019  Pre-op Diagnosis: Left upper lobe mass  Post-op Diagnosis: Same  Surgeon: Baltazar Apo  Assistants: None  Anesthesia: General endotracheal anesthesia  Operation: Flexible video fiberoptic bronchoscopy with electromagnetic navigation and biopsies.  Estimated Blood Loss: Minimal  Complications: None apparent  Indications and History: Linda Woods is a 69 y.o. female with left neck fullness that was evaluated with a CT neck and then CT chest.  A left upper lobe mass was identified.  Recommendation was made to achieve a tissue diagnosis via navigational bronchoscopy.  The risks, benefits, complications, treatment options and expected outcomes were discussed with the patient.  The possibilities of pneumothorax, pneumonia, reaction to medication, pulmonary aspiration, perforation of a viscus, bleeding, failure to diagnose a condition and creating a complication requiring transfusion or operation were discussed with the patient who freely signed the consent.    Description of Procedure: The patient was seen in the Preoperative Area, was examined and was deemed appropriate to proceed.  The patient was taken to [facility] endoscopy room 2, identified as Linda Woods and the procedure verified as Flexible Video Fiberoptic Bronchoscopy.  A Time Out was held and the above information confirmed.   Prior to the date of the procedure a high-resolution CT scan of the chest was performed. Utilizing [place] a virtual tracheobronchial tree was generated to allow the creation of distinct navigation pathways to the patient's parenchymal abnormalities. After being taken to the operating room general anesthesia was initiated and the patient  was orally intubated. The video fiberoptic bronchoscope was introduced via the endotracheal tube and a general inspection was performed which showed  normal airways throughout. The extendable working channel and locator guide were introduced into the bronchoscope. The distinct navigation pathways prepared prior to this procedure were then utilized to navigate to within 0.3 cm of patient's left upper lobe mass identified on CT scan. The extendable working channel was secured into place and the locator guide was withdrawn. Under fluoroscopic guidance transbronchial needle brushings, transbronchial Wang needle biopsies, and transbronchial forceps biopsies were performed to be sent for cytology and pathology. A bronchioalveolar lavage was performed in the left upper lobe and sent for cytology. At the end of the procedure a general airway inspection was performed and there was no evidence of active bleeding. The bronchoscope was removed.  The patient tolerated the procedure well. There was no significant blood loss and there were no obvious complications. A post-procedural chest x-ray is pending.  Samples: 1. Transbronchial needle brushings from left upper lobe mass 2. Transbronchial Wang needle biopsies from left upper lobe mass 3. Transbronchial forceps biopsies from left upper lobe mass 4. Bronchoalveolar lavage from left upper lobe  Plans:  The patient will be discharged from the PACU to home when recovered from anesthesia and after chest x-ray is reviewed. We will review the cytology, pathology and microbiology results with the patient when they become available. Outpatient followup will be with Dr. Lamonte Sakai.   Baltazar Apo, MD, PhD 10/20/2019, 1:06 PM [facility] [phone] or if no answer [phone]

## 2019-10-20 NOTE — Progress Notes (Signed)
Pt arrived to 4E13 from PACU. VSS. Patient oriented to room. Bed low, locked and call bell left within reach.   Pt's chest tube has air leak. On-call provider notified. Will continue to monitor

## 2019-10-20 NOTE — Transfer of Care (Signed)
Immediate Anesthesia Transfer of Care Note  Patient: Linda Woods  Procedure(s) Performed: VIDEO BRONCHOSCOPY WITH ENDOBRONCHIAL NAVIGATION (N/A ) BRONCHIAL BRUSHINGS BRONCHIAL NEEDLE ASPIRATION BIOPSIES BRONCHIAL BIOPSIES BRONCHIAL WASHINGS  Patient Location: Endoscopy Unit  Anesthesia Type:General  Level of Consciousness: awake, alert , oriented and patient cooperative  Airway & Oxygen Therapy: Patient Spontanous Breathing and Patient connected to face mask oxygen  Post-op Assessment: Report given to RN and Post -op Vital signs reviewed and stable  Post vital signs: Reviewed and stable  Last Vitals:  Vitals Value Taken Time  BP 161/98 10/20/19 1318  Temp 36.8 C 10/20/19 1318  Pulse 112 10/20/19 1318  Resp 17 10/20/19 1318  SpO2 96 % 10/20/19 1318    Last Pain:  Vitals:   10/20/19 1318  TempSrc: Oral  PainSc: 8       Patients Stated Pain Goal: 3 (47/82/95 6213)  Complications: No complications documented.

## 2019-10-20 NOTE — Anesthesia Procedure Notes (Signed)
Procedure Name: Intubation Date/Time: 10/20/2019 11:25 AM Performed by: Oletta Lamas, CRNA Pre-anesthesia Checklist: Patient identified, Emergency Drugs available, Suction available and Patient being monitored Patient Re-evaluated:Patient Re-evaluated prior to induction Oxygen Delivery Method: Circle System Utilized Preoxygenation: Pre-oxygenation with 100% oxygen Induction Type: IV induction Ventilation: Mask ventilation without difficulty Laryngoscope Size: Mac and 3 Grade View: Grade II Tube type: Oral Tube size: 8.0 mm Number of attempts: 1 Airway Equipment and Method: Stylet and Oral airway Placement Confirmation: ETT inserted through vocal cords under direct vision,  positive ETCO2 and breath sounds checked- equal and bilateral Secured at: 22 cm Tube secured with: Tape Dental Injury: Teeth and Oropharynx as per pre-operative assessment

## 2019-10-20 NOTE — Anesthesia Postprocedure Evaluation (Signed)
Anesthesia Post Note  Patient: Linda Woods  Procedure(s) Performed: VIDEO BRONCHOSCOPY WITH ENDOBRONCHIAL NAVIGATION (N/A ) BRONCHIAL BRUSHINGS BRONCHIAL NEEDLE ASPIRATION BIOPSIES BRONCHIAL BIOPSIES BRONCHIAL WASHINGS     Anesthesia Post Evaluation No complications documented.  Last Vitals:  Vitals:   10/20/19 1645 10/20/19 1715  BP: (!) 172/97 (!) 155/97  Pulse: 88 77  Resp: 19 17  Temp:    SpO2: 100% 100%    Last Pain:  Vitals:   10/20/19 1445  TempSrc:   PainSc: 2                  Tacha Manni

## 2019-10-20 NOTE — H&P (Signed)
NAME:  Linda Woods, MRN:  [id number], DOB:  [birth year], LOS: 0 ADMISSION DATE:  10/20/2019, CONSULTATION DATE:  10/20/19 REFERRING MD:  Dr. Lamonte Sakai, CHIEF COMPLAINT:  Pneumothorax   Brief History   69 y/o F with spiculated LUL opacity admitted 8/31 for observation after bronchoscopy with iatrogenic pneumothorax.   History of present illness   69 y/o F, never smoker, who was admitted for planned bronchoscopy.    The patient has a history of GERD, HTN, HLD, hypothyroidism, RLS and COVID-19 in November 2020 (s/p Ab infusion, vaccinated in March / April 2021) who was referred to Dr. Lamonte Sakai for evaluation of abnormal chest CT.  The patient noted she developed left neck fullness over the last several months, post receiving the COVID vaccine with evolving pain in her left cheek.  Symptoms prompted a CT scan of the neck on 10/13/19 which demonstrated asymmetry in the supraclavicular lymph nodes on the left with some stranding.  Limited visualization of the upper chest showed a partially visible confluent 8 cm solid appearing spiculated opacity in the LUL.  Follow up CT scan of the chest, abdomen and pelvis on 8/25 which showed an irregularly shaped 6.9 x 3.7 cm left upper lobe opacity, small left pleural effusion, 1.3 cm right hilar node and a 7 mm periaortic node.  The patient was planned for bronchoscopy with electromagnetic navigation with biopsies.  Transbronchial needle biopsies, Wang biopsy, BAL obtained during procedure.  Post procedure CXR demonstrated an approximate 15-20% pneumothorax.    Past Medical History  GERD HTN HLD Hypothyroidism RLS  COVID-19 - in Nov 2020, s/p Ab infusion, s/p vaccination in March/April 2021  Combine Hospital Events   8/31 Admit   Consults:    Procedures:    Significant Diagnostic Tests:    Micro Data:  COVID 8/30 >>   Antimicrobials:      Interim history/subjective:  Pt reports central chest discomfort, non-productive cough  Objective     Blood pressure (!) 172/98, pulse 93, temperature 98.3 F (36.8 C), temperature source Oral, resp. rate (!) 21, height 5' 5.5" (1.664 m), weight 79.9 kg, last menstrual period 08/21/2002, SpO2 100 %.        Intake/Output Summary (Last 24 hours) at 10/20/2019 1517 Last data filed at 10/20/2019 1327 Gross per 24 hour  Intake 700 ml  Output 30 ml  Net 670 ml   Filed Weights   10/20/19 0905  Weight: 79.9 kg    Examination: General: adult female lying in bed in NAD  HEENT: MM pink/moist, good dentition, anicteric  Neuro: AAOx4, speech clear, MAE CV: s1s2 RRR, no m/r/g PULM: non-labored on NRB (for ptx), lungs bilaterally clear GI: soft, bsx4 active  Extremities: warm/dry, no edema  Skin: no rashes or lesions  Resolved Hospital Problem list      Assessment & Plan:   LUL Spiculated Opacity with LAN Left Iatrogenic Pneumothorax  Cough Received 1x dose toradol post procedure  -admit for observation  -NRB now for possible nitrogen wash out / re-expansion of pneumothorax  -O2 if needed to maintain sats >90% -repeat CXR at 1700, pending review may need chest tube placement  -PRN tussionex for cough -PRN norco for pain  -reviewed with patient to inform staff of new chest pain, or shortness of breath for STAT CXR  -follow up cytology   Hypothyroidism  -continue synthroid   GERD  -continue home pepcid dosing   Situational Anxiety  New Rx in recent days for ativan in setting of possible cancer  diagnosis  -one time dose ativan and QHS PRN  Best practice:  Diet: Regular  Pain/Anxiety/Delirium protocol (if indicated): PRN home ativan  VAP protocol (if indicated): n/a  DVT prophylaxis: SCD GI prophylaxis: pepcid (home med) Glucose control: n/a Mobility: as tolerated  Code Status: Full Code  Family Communication: Patient and husband updated on plan of care.  Disposition: Progressive  Labs   CBC: Recent Labs  Lab 10/16/19 1158  WBC 14.6*  NEUTROABS 13.3*  HGB 14.1   HCT 40.9  MCV 91.0  PLT 629.4    Basic Metabolic Panel: Recent Labs  Lab 10/16/19 1158  NA 135  K 4.1  CL 100  CO2 23  GLUCOSE 177*  BUN 15  CREATININE 0.77  CALCIUM 10.3   GFR: Estimated Creatinine Clearance: 71.1 mL/min (by C-G formula based on SCr of 0.77 mg/dL). Recent Labs  Lab 10/16/19 1158  WBC 14.6*    Liver Function Tests: Recent Labs  Lab 10/16/19 1158  AST 16  ALT 17  ALKPHOS 102  BILITOT 0.4  PROT 7.4  ALBUMIN 4.7   No results for input(s): LIPASE, AMYLASE in the last 168 hours. No results for input(s): AMMONIA in the last 168 hours.  ABG    Component Value Date/Time   PHART 7.405 (H) 05/15/2010 1010   PCO2ART 37.7 05/15/2010 1010   PO2ART 90.9 05/15/2010 1010   HCO3 23.1 05/15/2010 1010   TCO2 20.3 05/15/2010 1010   ACIDBASEDEF 0.9 05/15/2010 1010   O2SAT 97.5 05/15/2010 1010     Coagulation Profile: Recent Labs  Lab 10/16/19 1158  INR 1.0    Cardiac Enzymes: No results for input(s): CKTOTAL, CKMB, CKMBINDEX, TROPONINI in the last 168 hours.  HbA1C: No results found for: HGBA1C  CBG: No results for input(s): GLUCAP in the last 168 hours.  Review of Systems: Positives in [place]  Gen: Denies fever, chills, weight change, fatigue, night sweats HEENT: Denies blurred vision, double vision, hearing loss, tinnitus, sinus congestion, rhinorrhea, sore throat, neck stiffness, dysphagia PULM: Denies shortness of breath, cough, sputum production, hemoptysis, wheezing CV: Denies chest pain, edema, orthopnea, paroxysmal nocturnal dyspnea, palpitations GI: Denies abdominal pain, nausea, vomiting, diarrhea, hematochezia, melena, constipation, change in bowel habits GU: Denies dysuria, hematuria, polyuria, oliguria, urethral discharge Endocrine: Denies hot or cold intolerance, polyuria, polyphagia or appetite change Derm: Denies rash, dry skin, scaling or peeling skin change Heme: Denies easy bruising, bleeding, bleeding gums Neuro: Denies  headache, numbness, weakness, slurred speech, loss of memory or consciousness  Past Medical History  She,  has a past medical history of Anemia, Anxiety, Bursitis, Chronic reflux esophagitis, Complication of anesthesia, Diarrhea, functional, Diverticulosis, GERD (gastroesophageal reflux disease), History of kidney stones, Hypertension, Knee pain, Osteoarthritis, Plantar fasciitis, PONV (postoperative nausea and vomiting), Pure hypercholesterolemia, RLS (restless legs syndrome), Superficial vein thrombosis, and Thyroid disease.   Surgical History    Past Surgical History:  Procedure Laterality Date  . APPENDECTOMY    . CARPAL TUNNEL RELEASE Right 2011   Dr. Amedeo Plenty  . COLPORRHAPHY     posterior  . HAND SURGERY Right 2016   Nerve surgery, Dr. Amedeo Plenty  . OVARIAN CYST REMOVAL    . RECTOCELE REPAIR  2011   w/TVH and sling  . TONSILLECTOMY    . TONSILLECTOMY    . TOTAL KNEE ARTHROPLASTY Left 09/09/2018   Procedure: TOTAL KNEE ARTHROPLASTY, CORTISONE INJECTION RIGHT KNEE;  Surgeon: Paralee Cancel, MD;  Location: [facility]S;  Service: Orthopedics;  Laterality: Left;  70 mins  . TOTAL  VAGINAL HYSTERECTOMY  10/18/2009   rectocele repair, sling  . TUBAL LIGATION Bilateral   . VARICOSE VEIN SURGERY       Social History   reports that she has never smoked. She has never used smokeless tobacco. She reports current alcohol use of about 2.0 - 3.0 standard drinks of alcohol per week. She reports that she does not use drugs.   Family History   Her family history includes Breast cancer in her maternal aunt and paternal aunt; Cancer in her brother; Cervical cancer in her mother; Diabetes in her father; Heart failure in her father and mother.   Allergies Allergies  Allergen Reactions  . Statins     Joint pain     Home Medications  Prior to Admission medications   Medication Sig Start Date End Date Taking? Authorizing Provider  diphenoxylate-atropine (LOMOTIL) 2.5-0.025 MG tablet Take 1 tablet by mouth  daily as needed for diarrhea or loose stools.  09/15/18  Yes [provider]  famotidine (PEPCID) 40 MG tablet Take 40 mg by mouth See admin instructions. Five times a week   Yes [provider]  fexofenadine (ALLEGRA) 180 MG tablet Take 180 mg by mouth daily.   Yes [provider]  fluticasone (FLONASE) 50 MCG/ACT nasal spray Place 2 sprays into both nostrils daily. 10/01/19  Yes [provider]  HYDROcodone-acetaminophen (NORCO/VICODIN) 5-325 MG tablet Take 0.5 tablets by mouth daily as needed (Knee pain).  09/21/19  Yes [provider]  HYDROcodone-homatropine (HYCODAN) 5-1.5 MG/5ML syrup Take 5 mLs by mouth daily as needed for cough. 07/08/19  Yes [provider]  ibuprofen (ADVIL) 200 MG tablet Take 400 mg by mouth every 6 (six) hours as needed (Knee pain).   Yes [provider]  LORazepam (ATIVAN) 0.5 MG tablet Take 0.5 mg by mouth at bedtime.    Yes [provider]  melatonin 5 MG TABS Take 5 mg by mouth at bedtime.   Yes [provider]  metoprolol succinate (TOPROL XL) 50 MG 24 hr tablet Take 50 mg by mouth daily.  07/22/14 10/19/19 Yes [provider]  oxymetazoline (AFRIN) 0.05 % nasal spray Place 1 spray into both nostrils daily as needed for congestion.   Yes [provider]  RABEprazole (ACIPHEX) 20 MG tablet Take 1 tablet (20 mg total) by mouth 2 (two) times a week. 03/08/16  Yes Megan Salon, MD  SYNTHROID 112 MCG tablet Take 112 mcg by mouth daily before breakfast.  08/18/17  Yes [provider]     Critical care time: [place], MSN, NP-C [facility] 10/20/2019, 3:23 PM   Please see [email or web address] for pager details.

## 2019-10-20 NOTE — Procedures (Signed)
Insertion of Chest Tube Procedure Note  Charice Zuno  [id number]  1950/12/31  Date:10/20/19  Time:6:27 PM    Provider Performing: Cristal Generous   Procedure: Pleural Catheter Insertion w/ Imaging Guidance [phone])  Indication(s) Pneumothorax  Consent Risks of the procedure as well as the alternatives and risks of each were explained to the patient and/or caregiver.  Consent for the procedure was obtained and is signed in the bedside chart  Anesthesia Topical only with 1% lidocaine    Time Out Verified patient identification, verified procedure, site/side was marked, verified correct patient position, special equipment/implants available, medications/allergies/relevant history reviewed, required imaging and test results available.   Sterile Technique Maximal sterile technique including full sterile barrier drape, hand hygiene, sterile gown, sterile gloves, mask, hair covering, sterile ultrasound probe cover (if used).   Procedure Description Ultrasound used to identify appropriate pleural anatomy for placement and overlying skin marked. Area of placement cleaned and draped in sterile fashion.  A 14 French pigtail pleural catheter was placed into the left pleural space using Seldinger technique. Appropriate return of air was obtained.  The tube was connected to atrium and placed on -20 cm H2O wall suction.  Complications/Tolerance None; patient tolerated the procedure well. Chest X-ray is ordered to verify placement.   EBL Minimal  Specimen(s) none   Eliseo Gum MSN, AGACNP-BC [place] [phone] If no answer, [phone] 10/20/2019, 6:28 PM

## 2019-10-20 NOTE — Interval H&P Note (Signed)
History and Physical Interval Note:  10/20/2019 10:47 AM  Linda Woods  has presented today for surgery, with the diagnosis of lung mass LUL.  The various methods of treatment have been discussed with the patient and family. After consideration of risks, benefits and other options for treatment, the patient has consented to  Procedure(s): [place] (N/A) as a surgical intervention.  The patient's history has been reviewed, patient examined, no change in status, stable for surgery.  I have reviewed the patient's chart and labs.  Questions were answered to the patient's satisfaction.     Collene Gobble

## 2019-10-20 NOTE — Progress Notes (Signed)
Pt arrived to PACU from endo at 1715. Pt evaluated by Dr. Silas Flood at bedside and notified of need for chest tube placement. Pt agreed, husband was contacted and consent was obtained by MD. MD to place chest tube at bedside in PACU.

## 2019-10-21 ENCOUNTER — Observation Stay (HOSPITAL_COMMUNITY): Payer: Medicare Other

## 2019-10-21 DIAGNOSIS — J939 Pneumothorax, unspecified: Secondary | ICD-10-CM | POA: Diagnosis not present

## 2019-10-21 DIAGNOSIS — R918 Other nonspecific abnormal finding of lung field: Secondary | ICD-10-CM | POA: Diagnosis not present

## 2019-10-21 LAB — BASIC METABOLIC PANEL
Anion gap: 12 (ref 5–15)
BUN: 11 mg/dL (ref 8–23)
CO2: 24 mmol/L (ref 22–32)
Calcium: 8.9 mg/dL (ref 8.9–10.3)
Chloride: 101 mmol/L (ref 98–111)
Creatinine, Ser: 0.81 mg/dL (ref 0.44–1.00)
GFR calc Af Amer: >60 mL/min (ref >60)
GFR calc non Af Amer: >60 mL/min (ref >60)
Glucose, Bld: 149 mg/dL — ABNORMAL HIGH (ref 70–99)
Potassium: 4.1 mmol/L (ref 3.5–5.1)
Sodium: 137 mmol/L (ref 135–145)

## 2019-10-21 LAB — CBC
HCT: 37.1 % (ref 36.0–46.0)
Hemoglobin: 12.3 g/dL (ref 12.0–15.0)
MCH: 30.4 pg (ref 26.0–34.0)
MCHC: 33.2 g/dL (ref 30.0–36.0)
MCV: 91.6 fL (ref 80.0–100.0)
Platelets: 243 10*3/uL (ref 150–400)
RBC: 4.05 MIL/uL (ref 3.87–5.11)
RDW: 12 % (ref 11.5–15.5)
WBC: 8.8 10*3/uL (ref 4.0–10.5)
nRBC: 0 % (ref 0.0–0.2)

## 2019-10-21 LAB — CYTOLOGY - NON PAP

## 2019-10-21 LAB — SURGICAL PATHOLOGY

## 2019-10-21 MED ORDER — HYDROCODONE-ACETAMINOPHEN 5-325 MG PO TABS
1.0000 | ORAL_TABLET | Freq: Two times a day (BID) | ORAL | Status: DC | PRN
  Administered 2019-10-21: 1 via ORAL
  Filled 2019-10-21: qty 1

## 2019-10-21 MED ORDER — HYDROCODONE-ACETAMINOPHEN 5-325 MG PO TABS
1.0000 | ORAL_TABLET | Freq: Four times a day (QID) | ORAL | Status: DC | PRN
  Administered 2019-10-21 – 2019-10-23 (×7): 1 via ORAL
  Filled 2019-10-21 (×7): qty 1

## 2019-10-21 MED ORDER — METOPROLOL SUCCINATE ER 50 MG PO TB24
50.0000 mg | ORAL_TABLET | Freq: Every day | ORAL | Status: DC
  Administered 2019-10-21 – 2019-10-23 (×3): 50 mg via ORAL
  Filled 2019-10-21 (×3): qty 1

## 2019-10-21 NOTE — Progress Notes (Signed)
NAME:  Linda Woods, MRN:  [id number], DOB:  25-Sep-1950, LOS: 0 ADMISSION DATE:  10/20/2019, CONSULTATION DATE:  10/20/19 REFERRING MD:  Dr. Lamonte Sakai, CHIEF COMPLAINT:  Pneumothorax   Brief History   69 y/o F with spiculated LUL opacity admitted 8/31 for observation after bronchoscopy with iatrogenic pneumothorax.   History of present illness   69 y/o F, never smoker, who was admitted for planned bronchoscopy.    The patient has a history of GERD, HTN, HLD, hypothyroidism, RLS and COVID-19 in November 2020 (s/p Ab infusion, vaccinated in March / April 2021) who was referred to Dr. Lamonte Sakai for evaluation of abnormal chest CT.  The patient noted she developed left neck fullness over the last several months, post receiving the COVID vaccine with evolving pain in her left cheek.  Symptoms prompted a CT scan of the neck on 10/13/19 which demonstrated asymmetry in the supraclavicular lymph nodes on the left with some stranding.  Limited visualization of the upper chest showed a partially visible confluent 8 cm solid appearing spiculated opacity in the LUL.  Follow up CT scan of the chest, abdomen and pelvis on 8/25 which showed an irregularly shaped 6.9 x 3.7 cm left upper lobe opacity, small left pleural effusion, 1.3 cm right hilar node and a 7 mm periaortic node.  The patient was planned for bronchoscopy with electromagnetic navigation with biopsies.  Transbronchial needle biopsies, Wang biopsy, BAL obtained during procedure.  Post procedure CXR demonstrated an approximate 15-20% pneumothorax.    Past Medical History  GERD HTN HLD Hypothyroidism RLS  COVID-19 - in Nov 2020, s/p Ab infusion, s/p vaccination in March/April 2021  [place] [facility] Events   8/31 Admit   Consults:    Procedures:  Navigational bronchoscopy 8/31 Left pigtail chest tube 8/31 >>   Significant Diagnostic Tests:    Micro Data:  COVID 8/30 >>   Antimicrobials:      Interim history/subjective:   Chest  discomfort is improved, requiring some low-dose Norco Still with some cough  Objective   Blood pressure 119/73, pulse 84, temperature 97.9 F (36.6 C), temperature source Oral, resp. rate 18, height 5' 5.5" (1.664 m), weight 79.9 kg, last menstrual period 08/21/2002, SpO2 96 %.        Intake/Output Summary (Last 24 hours) at 10/21/2019 1333 Last data filed at 10/21/2019 0600 Gross per 24 hour  Intake 490 ml  Output 100 ml  Net 390 ml   Filed Weights   10/20/19 0905  Weight: 79.9 kg    Examination: General: Comfortable in bed, room air no distress HEENT: Oropharynx clear, no stridor Neuro: Awake alert nonfocal CV: Regular, no murmur PULM: Comfortable respiratory pattern, good air movement bilaterally.  She has a 1/7 airleak on -20 cm suction with every other breath GI: Soft, nondistended with positive bowel sounds Extremities: No edema Skin: No rash  Resolved Hospital Problem list      Assessment & Plan:   LUL Spiculated Opacity with LAN Left Iatrogenic Pneumothorax  Cough Plan to continue left chest tube to suction, wait for air leak to resolve and then trial waterseal. Follow daily chest x-ray Tussionex as needed for cough Norco as needed for pain, dose adjusted on 9/1 Follow cytology and pathology from bronchoscopy, still pending  Hypothyroidism  Synthroid as ordered  GERD  Pepcid as ordered  Situational Anxiety  New Rx in recent days for ativan in setting of possible cancer diagnosis  Continue Ativan as needed  Best practice:  Diet: Regular  Pain/Anxiety/Delirium protocol (if indicated): PRN home ativan  VAP protocol (if indicated): n/a  DVT prophylaxis: SCD GI prophylaxis: pepcid (home med) Glucose control: n/a Mobility: as tolerated  Code Status: Full Code  Family Communication: Updated patient in full 9/1 Disposition: Progressive  Labs   CBC: Recent Labs  Lab 10/16/19 1158 10/21/19 0327  WBC 14.6* 8.8  NEUTROABS 13.3*  --   HGB 14.1 12.3    HCT 40.9 37.1  MCV 91.0 91.6  PLT 314.0 311    Basic Metabolic Panel: Recent Labs  Lab 10/16/19 1158 10/21/19 0327  NA 135 137  K 4.1 4.1  CL 100 101  CO2 23 24  GLUCOSE 177* 149*  BUN 15 11  CREATININE 0.77 0.81  CALCIUM 10.3 8.9   GFR: Estimated Creatinine Clearance: 70.2 mL/min (by C-G formula based on SCr of 0.81 mg/dL). Recent Labs  Lab 10/16/19 1158 10/21/19 0327  WBC 14.6* 8.8    Liver Function Tests: Recent Labs  Lab 10/16/19 1158  AST 16  ALT 17  ALKPHOS 102  BILITOT 0.4  PROT 7.4  ALBUMIN 4.7   No results for input(s): LIPASE, AMYLASE in the last 168 hours. No results for input(s): AMMONIA in the last 168 hours.  ABG    Component Value Date/Time   PHART 7.405 (H) 05/15/2010 1010   PCO2ART 37.7 05/15/2010 1010   PO2ART 90.9 05/15/2010 1010   HCO3 23.1 05/15/2010 1010   TCO2 20.3 05/15/2010 1010   ACIDBASEDEF 0.9 05/15/2010 1010   O2SAT 97.5 05/15/2010 1010     Coagulation Profile: Recent Labs  Lab 10/16/19 1158  INR 1.0    Cardiac Enzymes: No results for input(s): CKTOTAL, CKMB, CKMBINDEX, TROPONINI in the last 168 hours.  HbA1C: No results found for: HGBA1C  CBG: No results for input(s): GLUCAP in the last 168 hours.    Critical care time: n/a     Baltazar Apo, MD, PhD 10/21/2019, 1:44 PM [facility] [phone] or if no answer [phone]

## 2019-10-21 NOTE — Progress Notes (Signed)
Spoke with the patient and her husband about her biopsy results, show adenoCA of the lung. We will have to determine whether she needs a bx of her left supraclavicular region to assist w staging.   Will plan to do as much staging w/u as possible before referral to [place] at [facility].   Will recheck CXR in am.   Baltazar Apo, MD, PhD 10/21/2019, 6:01 PM [facility] [phone] or if no answer [phone]

## 2019-10-21 NOTE — Care Management Obs Status (Signed)
[place] NOTIFICATION   Patient Details  Name: Linda Woods MRN: [id number] Date of Birth: 01-02-1951   Medicare Observation Status Notification Given:  Yes    Dawayne Patricia, RN 10/21/2019, 2:55 PM

## 2019-10-22 ENCOUNTER — Other Ambulatory Visit: Payer: Self-pay | Admitting: Emergency Medicine

## 2019-10-22 ENCOUNTER — Observation Stay (HOSPITAL_COMMUNITY): Payer: Medicare Other

## 2019-10-22 DIAGNOSIS — E785 Hyperlipidemia, unspecified: Secondary | ICD-10-CM | POA: Diagnosis present

## 2019-10-22 DIAGNOSIS — Z79899 Other long term (current) drug therapy: Secondary | ICD-10-CM | POA: Diagnosis not present

## 2019-10-22 DIAGNOSIS — J939 Pneumothorax, unspecified: Secondary | ICD-10-CM | POA: Diagnosis not present

## 2019-10-22 DIAGNOSIS — Z79890 Hormone replacement therapy (postmenopausal): Secondary | ICD-10-CM | POA: Diagnosis not present

## 2019-10-22 DIAGNOSIS — Z96652 Presence of left artificial knee joint: Secondary | ICD-10-CM | POA: Diagnosis present

## 2019-10-22 DIAGNOSIS — K219 Gastro-esophageal reflux disease without esophagitis: Secondary | ICD-10-CM | POA: Diagnosis present

## 2019-10-22 DIAGNOSIS — R918 Other nonspecific abnormal finding of lung field: Secondary | ICD-10-CM | POA: Diagnosis present

## 2019-10-22 DIAGNOSIS — Z803 Family history of malignant neoplasm of breast: Secondary | ICD-10-CM | POA: Diagnosis not present

## 2019-10-22 DIAGNOSIS — K2100 Gastro-esophageal reflux disease with esophagitis, without bleeding: Secondary | ICD-10-CM | POA: Diagnosis present

## 2019-10-22 DIAGNOSIS — I10 Essential (primary) hypertension: Secondary | ICD-10-CM | POA: Diagnosis present

## 2019-10-22 DIAGNOSIS — Z888 Allergy status to other drugs, medicaments and biological substances status: Secondary | ICD-10-CM | POA: Diagnosis not present

## 2019-10-22 DIAGNOSIS — C3412 Malignant neoplasm of upper lobe, left bronchus or lung: Secondary | ICD-10-CM | POA: Diagnosis present

## 2019-10-22 DIAGNOSIS — Z8049 Family history of malignant neoplasm of other genital organs: Secondary | ICD-10-CM | POA: Diagnosis not present

## 2019-10-22 DIAGNOSIS — Z20822 Contact with and (suspected) exposure to covid-19: Secondary | ICD-10-CM | POA: Diagnosis present

## 2019-10-22 DIAGNOSIS — G2581 Restless legs syndrome: Secondary | ICD-10-CM | POA: Diagnosis present

## 2019-10-22 DIAGNOSIS — Z808 Family history of malignant neoplasm of other organs or systems: Secondary | ICD-10-CM | POA: Diagnosis not present

## 2019-10-22 DIAGNOSIS — Z8616 Personal history of COVID-19: Secondary | ICD-10-CM | POA: Diagnosis not present

## 2019-10-22 DIAGNOSIS — E7800 Pure hypercholesterolemia, unspecified: Secondary | ICD-10-CM | POA: Diagnosis present

## 2019-10-22 DIAGNOSIS — E039 Hypothyroidism, unspecified: Secondary | ICD-10-CM | POA: Diagnosis present

## 2019-10-22 DIAGNOSIS — J95811 Postprocedural pneumothorax: Secondary | ICD-10-CM | POA: Diagnosis present

## 2019-10-22 DIAGNOSIS — F419 Anxiety disorder, unspecified: Secondary | ICD-10-CM | POA: Diagnosis present

## 2019-10-22 DIAGNOSIS — C3492 Malignant neoplasm of unspecified part of left bronchus or lung: Secondary | ICD-10-CM

## 2019-10-22 DIAGNOSIS — K5790 Diverticulosis of intestine, part unspecified, without perforation or abscess without bleeding: Secondary | ICD-10-CM | POA: Diagnosis present

## 2019-10-22 DIAGNOSIS — Z8249 Family history of ischemic heart disease and other diseases of the circulatory system: Secondary | ICD-10-CM | POA: Diagnosis not present

## 2019-10-22 DIAGNOSIS — Z833 Family history of diabetes mellitus: Secondary | ICD-10-CM | POA: Diagnosis not present

## 2019-10-22 NOTE — Progress Notes (Signed)
NAME:  Linda Woods, MRN:  [id number], DOB:  1950-03-27, LOS: 0 ADMISSION DATE:  10/20/2019, CONSULTATION DATE:  10/20/19 REFERRING MD:  Dr. Lamonte Sakai, CHIEF COMPLAINT:  Pneumothorax   Brief History   69 y/o F with spiculated LUL opacity admitted 8/31 for observation after bronchoscopy with iatrogenic pneumothorax.   History of present illness   69 y/o F, never smoker, who was admitted for planned bronchoscopy.    The patient has a history of GERD, HTN, HLD, hypothyroidism, RLS and COVID-19 in November 2020 (s/p Ab infusion, vaccinated in March / April 2021) who was referred to Dr. Lamonte Sakai for evaluation of abnormal chest CT.  The patient noted she developed left neck fullness over the last several months, post receiving the COVID vaccine with evolving pain in her left cheek.  Symptoms prompted a CT scan of the neck on 10/13/19 which demonstrated asymmetry in the supraclavicular lymph nodes on the left with some stranding.  Limited visualization of the upper chest showed a partially visible confluent 8 cm solid appearing spiculated opacity in the LUL.  Follow up CT scan of the chest, abdomen and pelvis on 8/25 which showed an irregularly shaped 6.9 x 3.7 cm left upper lobe opacity, small left pleural effusion, 1.3 cm right hilar node and a 7 mm periaortic node.  The patient was planned for bronchoscopy with electromagnetic navigation with biopsies.  Transbronchial needle biopsies, Wang biopsy, BAL obtained during procedure.  Post procedure CXR demonstrated an approximate 15-20% pneumothorax.    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  Less chest discomfort   Objective   Blood pressure 101/69, pulse 68, temperature 97.8 F (36.6 C), temperature source Oral, resp. rate 16, height 5' 5.5" (1.664 m), weight 79.6 kg, last menstrual period 08/21/2002, SpO2 94 %.        Intake/Output Summary (Last 24 hours) at 10/22/2019 1346 Last data filed at 10/22/2019 0800 Gross per 24 hour  Intake 240 ml  Output 230 ml  Net 10 ml   Filed Weights   10/20/19 0905 10/22/19 0436  Weight: 79.9 kg 79.6 kg    Examination: General: Comfortable in bed on room air, no distress HEENT: Oropharynx clear, pupils equal, no stridor Neuro: Awake alert, nonfocal CV: Regular, distant, no murmur r PULM: Comfortable respiratory pattern, clear bilaterally.  Air leak is almost fully resolved.  Only a few bubbles total after watching for 5 minutes, improved significantly from 9/1 GI: Nondistended, positive bowel sounds Extremities: No edema Skin: No rash  Resolved Hospital Problem list      Assessment & Plan:   LUL Spiculated Opacity with LAN Left Iatrogenic Pneumothorax  Cough Given improvement in her air leak I think we can try her on waterseal now.  Check a chest x-ray at 1600 if the pneumothorax does not reaccumulate then I will try leaving her on waterseal overnight.  May be able to consider DC chest tube on 9/3 Follow chest x-ray this afternoon and tomorrow morning Tussionex as needed for cough Norco as needed for  pain Biopsies from bronchoscopy show adenocarcinoma.  I will make a referral to the [facility] today.  She will need a PET scan ordered as an outpatient  Hypothyroidism  Synthroid as ordered  GERD  Pepcid as ordered  Situational Anxiety  New Rx in recent days for ativan in setting of possible cancer diagnosis  Continue Ativan as needed  Best practice:  Diet: Regular  Pain/Anxiety/Delirium protocol (if indicated): PRN home  ativan  VAP protocol (if indicated): n/a  DVT prophylaxis: SCD GI prophylaxis: pepcid (home med) Glucose control: n/a Mobility: as tolerated  Code Status: Full Code  Family Communication: Updated patient in full 9/1 Disposition: Progressive  Labs   CBC: Recent Labs  Lab 10/16/19 1158 10/21/19 0327  WBC 14.6* 8.8  NEUTROABS 13.3*  --   HGB 14.1 12.3  HCT 40.9 37.1  MCV 91.0 91.6  PLT 314.0 552    Basic Metabolic Panel: Recent Labs  Lab 10/16/19 1158 10/21/19 0327  NA 135 137  K 4.1 4.1  CL 100 101  CO2 23 24  GLUCOSE 177* 149*  BUN 15 11  CREATININE 0.77 0.81  CALCIUM 10.3 8.9   GFR: Estimated Creatinine Clearance: 70.1 mL/min (by C-G formula based on SCr of 0.81 mg/dL). Recent Labs  Lab 10/16/19 1158 10/21/19 0327  WBC 14.6* 8.8    Liver Function Tests: Recent Labs  Lab 10/16/19 1158  AST 16  ALT 17  ALKPHOS 102  BILITOT 0.4  PROT 7.4  ALBUMIN 4.7   No results for input(s): LIPASE, AMYLASE in the last 168 hours. No results for input(s): AMMONIA in the last 168 hours.  ABG    Component Value Date/Time   PHART 7.405 (H) 05/15/2010 1010   PCO2ART 37.7 05/15/2010 1010   PO2ART 90.9 05/15/2010 1010   HCO3 23.1 05/15/2010 1010   TCO2 20.3 05/15/2010 1010   ACIDBASEDEF 0.9 05/15/2010 1010   O2SAT 97.5 05/15/2010 1010     Coagulation Profile: Recent Labs  Lab 10/16/19 1158  INR 1.0    Cardiac Enzymes: No results for input(s): CKTOTAL, CKMB, CKMBINDEX, TROPONINI in the last 168 hours.  HbA1C: No results found for: HGBA1C  CBG: No results for input(s): GLUCAP in the last 168 hours.    Critical care time: n/a     Baltazar Apo, MD, PhD 10/22/2019, 1:46 PM [facility] [phone] or if no answer [phone]

## 2019-10-22 NOTE — Progress Notes (Signed)
MD at bedside. Per MD, chest tube to water seal at this time.

## 2019-10-22 NOTE — Progress Notes (Signed)
PCCM Interval Note  Tiny accumulation of PTX on water seal (22mm).   I'll put her back to suction -10 cm H2O for overnight, plan back to waterseal in the am. Hopefully we will be able to work towards getting chest tube out 9/3 or 9/4.   Baltazar Apo, MD, PhD 10/22/2019, 6:26 PM [facility] [phone] or if no answer [phone]

## 2019-10-23 ENCOUNTER — Inpatient Hospital Stay (HOSPITAL_COMMUNITY): Payer: Medicare Other

## 2019-10-23 DIAGNOSIS — R918 Other nonspecific abnormal finding of lung field: Secondary | ICD-10-CM

## 2019-10-23 DIAGNOSIS — J939 Pneumothorax, unspecified: Secondary | ICD-10-CM

## 2019-10-23 NOTE — Discharge Summary (Signed)
Physician Discharge Summary         Patient ID: Linda Woods MRN: [id number] DOB/AGE: Nov 23, 1950 69 y.o.  Admit date: 10/20/2019 Discharge date: 10/27/2019  Discharge Diagnoses:   Iatrogenic pneumothorax post biopsy 8/31 Adenocarcinoma of the lung  Discharge summary   69 y/o F, never smoker, who was admitted 8/31  for planned bronchoscopy.   The patient has a history of GERD, HTN, HLD, hypothyroidism, RLS and COVID-19 in November 2020 (s/p Ab infusion, vaccinated in March / April 2021) who was referred to Dr. Lamonte Sakai for evaluation of abnormal chest CT.  The patient noted she developed left neck fullness over the last several months, post receiving the COVID vaccine with evolving pain in her left cheek.  Symptoms prompted a CT scan of the neck on 10/13/19 which demonstrated asymmetry in the supraclavicular lymph nodes on the left with some stranding.  Limited visualization of the upper chest showed a partially visible confluent 8 cm solid appearing spiculated opacity in the LUL.  Follow up CT scan of the chest, abdomen and pelvis on 8/25 which showed an irregularly shaped 6.9 x 3.7 cm left upper lobe opacity, small left pleural effusion, 1.3 cm right hilar node and a 7 mm periaortic node.  The patient was planned for bronchoscopy with electromagnetic navigation with biopsies.  Transbronchial needle biopsies, Wang biopsy, BAL obtained during procedure.  Post procedure CXR demonstrated an approximate 15-20% pneumothorax. A  14 french pigtail pleural catheter was placed into the left pleural space using the Seldinger technique. The Chest tube was placed to an atrium collection system and placed on -20 cm H2O wall suction.   Pt had a small leak which resolved 9/2, at which time the patient chest tube was placed to water seal. The morning of 9/3 patient had no water seal, and CXR showed a small 5% apical pneumothorax. The chest tube was clamped and CXR obtained at 1600 to determine ability to  remove chest tube and discharge home.   Biopsy was positive for adenocarcinoma of the lung. Referral has been made to [facility] per Dr. Lamonte Sakai . Patient will need an OP PET scan and follow up at the pulmonary office with CXR early next week.   Discharge Plan by Active Problems   LUL Spiculated Opacity with LAN Left Iatrogenic Pneumothorax  Cough OP visit with Pulmonary with CXR prior Tussionex as needed for cough Norco as needed for pain( 1-2 tablets every 6 hours [facility] referral made by Dr. Lamonte Sakai Will need OP PET scan ordered  Hypothyroidism  Continue Synthroid   GERD  Continue Pepcid as needed  Situational Anxiety  New Rx in recent days for ativan in setting of possible cancer diagnosis  Continue low dose [facility] tests/ studies  8/31 CXR Left pneumothorax estimated at 15-20%. Known mass in the left upper lobe. Interstitial prominence in the left lung. Prominence of the left hilum.  10/23/2019 CXR No significant interval change in AP portable chest radiograph. There may be a very minimal, less than 5% left apical pneumothorax, not significantly changed compared to prior examination. Left-sided chest tube remains in position. Procedures   10/20/2019>>  A  14 french pigtail pleural catheter was placed into the left pleural space using the Seldinger technique. The Chest tube was placed to an atrium collection system and placed on -20 cm H2O wall suction.    Culture data/antimicrobials   10/20/2019 Cytology>>  FINAL MICROSCOPIC DIAGNOSIS:  A. LUNG, LUL, BRUSHING:  -  Malignant cells consistent with adenocarcinoma   B. LUNG, LUL, FINE NEEDLE ASPIRATION:  - Atypical cells present.    Consults      Discharge Exam: BP (!) 116/91 (BP Location: Right Arm)   Pulse 75   Temp 98.5 F (36.9 C) (Oral)   Resp 18   Ht 5' 5.5" (1.664 m)   Wt 79.3 kg   LMP 08/21/2002   SpO2 95%   BMI 28.66 kg/m    Labs at discharge   Lab Results   Component Value Date   CREATININE 0.81 10/21/2019   BUN 11 10/21/2019   NA 137 10/21/2019   K 4.1 10/21/2019   CL 101 10/21/2019   CO2 24 10/21/2019   Lab Results  Component Value Date   WBC 8.8 10/21/2019   HGB 12.3 10/21/2019   HCT 37.1 10/21/2019   MCV 91.6 10/21/2019   PLT 243 10/21/2019   Lab Results  Component Value Date   ALT 17 10/16/2019   AST 16 10/16/2019   ALKPHOS 102 10/16/2019   BILITOT 0.4 10/16/2019   Lab Results  Component Value Date   INR 1.0 10/16/2019   INR 1.00 10/14/2009    Current radiological studies    No results found.  Disposition:  Disposition : Home   Discharge Instructions    Discharge patient   Complete by: As directed    After CXR has been evaluated   Discharge disposition: 01-Home or Self Care   Discharge patient date: 10/20/2019      Allergies as of 10/23/2019      Reactions   Statins    Joint pain      Medication List    TAKE these medications   diphenoxylate-atropine 2.5-0.025 MG tablet Commonly known as: LOMOTIL Take 1 tablet by mouth daily as needed for diarrhea or loose stools.   famotidine 40 MG tablet Commonly known as: PEPCID Take 40 mg by mouth See admin instructions. Five times a week   fexofenadine 180 MG tablet Commonly known as: ALLEGRA Take 180 mg by mouth daily.   fluticasone 50 MCG/ACT nasal spray Commonly known as: FLONASE Place 2 sprays into both nostrils daily.   HYDROcodone-acetaminophen 5-325 MG tablet Commonly known as: NORCO/VICODIN Take 0.5 tablets by mouth daily as needed (Knee pain).   HYDROcodone-homatropine 5-1.5 MG/5ML syrup Commonly known as: HYCODAN Take 5 mLs by mouth daily as needed for cough.   ibuprofen 200 MG tablet Commonly known as: ADVIL Take 400 mg by mouth every 6 (six) hours as needed (Knee pain).   LORazepam 0.5 MG tablet Commonly known as: ATIVAN Take 0.5 mg by mouth at bedtime.   melatonin 5 MG Tabs Take 5 mg by mouth at bedtime.   oxymetazoline 0.05 %  nasal spray Commonly known as: AFRIN Place 1 spray into both nostrils daily as needed for congestion.   RABEprazole 20 MG tablet Commonly known as: Aciphex Take 1 tablet (20 mg total) by mouth 2 (two) times a week.   Synthroid 112 MCG tablet Generic drug: levothyroxine Take 112 mcg by mouth daily before breakfast.   Toprol XL 50 MG 24 hr tablet Generic drug: metoprolol succinate Take 50 mg by mouth daily.        Follow-up appointment   [facility]  With CXR prior 10/28/2019 at 3:30 pm with Eric Form, NP.  Come 15 minutes early for CXR prior to OV. Seek emergency care  for increasing shortness of breath or chest/ back  Pain Call the office for any  drainage from chest tube site Call the office for any new fever  Discharge Condition:    good  Physician Statement:   The Patient was personally examined, the discharge assessment and plan has been personally reviewed and I agree with ACNP Groce's  assessment and plan. 45 minutes of time have been dedicated to discharge assessment, planning and discharge instructions.   Signed: Magdalen Spatz, MSN, AGACNP-BC [place] for personal pager PCCM on call pager [phone] 10/27/2019, 7:32 AM   Attending Note:  I have examined patient, reviewed labs, studies and notes.   I agree with the discharge assessment and plans.  Follow up arranged as noted.    Baltazar Apo, MD, PhD 10/27/2019, 7:32 AM [facility] [phone] or if no answer [phone]

## 2019-10-23 NOTE — Discharge Instructions (Signed)
Pneumothorax A pneumothorax is commonly called a collapsed lung. It is a condition in which air leaks from a lung and builds up between the thin layer of tissue that covers the lungs (visceral pleura) and the interior wall of the chest cavity (parietal pleura). The air gets trapped outside the lung, between the lung and the chest wall (pleural space). The air takes up space and prevents the lung from fully expanding. This condition sometimes occurs suddenly with no apparent cause. The buildup of air may be small or large. A small pneumothorax may go away on its own. A large pneumothorax will require treatment and hospitalization. What are the causes? This condition may be caused by:  Trauma and injury to the chest wall.  Surgery and other medical procedures.  A complication of an underlying lung problem, especially chronic obstructive pulmonary disease (COPD) or emphysema. Sometimes the cause of this condition is not known. What increases the risk? You are more likely to develop this condition if:  You have an underlying lung problem.  You smoke.  You are 70-26 years old, female, tall, and underweight.  You have a personal or family history of pneumothorax.  You have an eating disorder (anorexia nervosa). This condition can also happen quickly, even in people with no history of lung problems. What are the signs or symptoms? Sometimes a pneumothorax will have no symptoms. When symptoms are present, they can include:  Chest pain.  Shortness of breath.  Increased rate of breathing.  Bluish color to your lips or skin (cyanosis). How is this diagnosed? This condition may be diagnosed by:  A medical history and physical exam.  A chest X-ray, chest CT scan, or ultrasound. How is this treated? Treatment depends on how severe your condition is. The goal of treatment is to remove the extra air and allow your lung to expand back to its normal size.  For a small pneumothorax: ? No  treatment may be needed. ? Extra oxygen is sometimes used to make it go away more quickly.  For a large pneumothorax or a pneumothorax that is causing symptoms, a procedure is done to drain the air from your lungs. To do this, a health care provider may use: ? A needle with a syringe. This is used to suck air from a pleural space where no additional leakage is taking place. ? A chest tube. This is used to suck air where there is ongoing leakage into the pleural space. The chest tube may need to remain in place for several days until the air leak has healed.  In more severe cases, surgery may be needed to repair the damage that is causing the leak.  If you have multiple pneumothorax episodes or have an air leak that will not heal, a procedure called a pleurodesis may be done. A medicine is placed in the pleural space to irritate the tissues around the lung so that the lung will stick to the chest wall, seal any leaks, and stop any buildup of air in that space. If you have an underlying lung problem, severe symptoms, or a large pneumothorax you will usually need to stay in the hospital. Follow these instructions at home: Lifestyle  Do not use any products that contain nicotine or tobacco, such as cigarettes and e-cigarettes. These are major risk factors in pneumothorax. If you need help quitting, ask your health care provider.  Do not lift anything that is heavier than 10 lb (4.5 kg), or the limit that your health care  provider tells you, until he or she says that it is safe.  Avoid activities that take a lot of effort (strenuous) for as long as told by your health care provider.  Return to your normal activities as told by your health care provider. Ask your health care provider what activities are safe for you.  Do not fly in an airplane or scuba dive until your health care provider says it is okay. General instructions  Take over-the-counter and prescription medicines only as told by your  health care provider.  If a cough or pain makes it difficult for you to sleep at night, try sleeping in a semi-upright position in a recliner or by using 2 or 3 pillows.  If you had a chest tube and it was removed, ask your health care provider when you can remove the bandage (dressing). While the dressing is in place, do not allow it to get wet.  Keep all follow-up visits as told by your health care provider. This is important. Contact a health care provider if:  You cough up thick mucus (sputum) that is yellow or green in color.  You were treated with a chest tube, and you have redness, increasing pain, or discharge at the site where it was placed. Get help right away if:  You have increasing chest pain or shortness of breath.  You have a cough that will not go away.  You begin coughing up blood.  You have pain that is getting worse or is not controlled with medicines.  The site where your chest tube was located opens up.  You feel air coming out of the site where the chest tube was placed.  You have a fever or persistent symptoms for more than 2-3 days.  You have a fever and your symptoms suddenly get worse. These symptoms may represent a serious problem that is an emergency. Do not wait to see if the symptoms will go away. Get medical help right away. Call your local emergency services (911 in the [place] Do not drive yourself to the hospital. Summary  A pneumothorax, commonly called a collapsed lung, is a condition in which air leaks from a lung and gets trapped between the lung and the chest wall (pleural space).  The buildup of air may be small or large. A small pneumothorax may go away on its own. A large pneumothorax will require treatment and hospitalization.  Treatment for this condition depends on how severe the pneumothorax is. The goal of treatment is to remove the extra air and allow the lung to expand back to its normal size. This information is not intended to  replace advice given to you by your health care provider. Make sure you discuss any questions you have with your health care provider. Document Revised: 01/18/2017 Document Reviewed: 01/14/2017 Elsevier Patient Education  2020 [employer].

## 2019-10-23 NOTE — Progress Notes (Signed)
Brief Progress Note  CXR with small 5% apical pnemothorax Pt without pain or shortness of breath, Sats 96% on RA No Leak per CT at rest or with cough, minimal serous drainage Chest tube clamped at 12:00>> RN made aware Oxygen at 2 L applied CXR pending for 1600 to evaluate for stability of pneumo with CT clamped If patient tolerates, can consider discharge home with close OP follow up and CXR OP follow up scheduled for 10/28/2019 with Eric Form NP at 3:30 pm with CXR prior ( First available)  Magdalen Spatz, MSN, AGACNP-BC [facility] for personal pager PCCM on call pager [phone] 10/23/2019 12:52 PM

## 2019-10-23 NOTE — Progress Notes (Signed)
Discharge instructions provided to patient and husband. All medications, follow up appointments, and discharge instructions provided. IV out. Monitor off CCMD notified. Discharging to home with husband.  Era Bumpers, RN

## 2019-10-28 ENCOUNTER — Ambulatory Visit (INDEPENDENT_AMBULATORY_CARE_PROVIDER_SITE_OTHER): Payer: Medicare Other

## 2019-10-28 ENCOUNTER — Encounter: Payer: Self-pay | Admitting: Acute Care

## 2019-10-28 ENCOUNTER — Other Ambulatory Visit: Payer: Self-pay

## 2019-10-28 ENCOUNTER — Ambulatory Visit: Payer: Medicare Other | Admitting: Acute Care

## 2019-10-28 ENCOUNTER — Encounter: Payer: Self-pay | Admitting: *Deleted

## 2019-10-28 ENCOUNTER — Telehealth: Payer: Self-pay | Admitting: Internal Medicine

## 2019-10-28 VITALS — BP 132/72 | HR 87 | Temp 97.3°F | Ht 65.0 in | Wt 181.1 lb

## 2019-10-28 DIAGNOSIS — C3492 Malignant neoplasm of unspecified part of left bronchus or lung: Secondary | ICD-10-CM

## 2019-10-28 DIAGNOSIS — R918 Other nonspecific abnormal finding of lung field: Secondary | ICD-10-CM

## 2019-10-28 NOTE — Patient Instructions (Addendum)
It was good to see you today. CXR we will call you with the results. We have ordered a PET scan. You will get a call to get this scheduled.  Follow up with Dr. Inda Merlin as is scheduled 9/14  Follow up with Dr. Lamonte Sakai as scheduled 9/29 as is scheduled.  Call us if you need [name] sooner.  Please contact office for sooner follow up if symptoms do not improve or worsen or seek emergency care

## 2019-10-28 NOTE — Telephone Encounter (Signed)
Received a new pt referral from dr. Lamonte Sakai at [facility] for lung cancer. Linda Woods has been cld and scheduled to see Dr. Julien Nordmann on 9/14 at 215pm w/labs at 145pm. Pt aware to arrive 15 minutes early.

## 2019-10-28 NOTE — Progress Notes (Signed)
History of Present Illness Linda Woods is a 69 y.o. female never smoker with new diagnosis of adenocarcinoma of the lung.  She is followed by Dr. Lamonte Sakai who performed planned biopsy.  For tissue diagnosis 10/20/2019  Hospital follow-up 10/28/2019  Synopsis Admission date 8/31 Discharge date 10/23/2019 Discharge Diagnoses:   CXR we will call you with the results. We have ordered a PET scan. You will get a call to get this scheduled.  Follow up with Dr. Inda Merlin as is scheduled 9/14  Follow up with Dr. Lamonte Sakai as scheduled 9/29 as is scheduled.  Call us if you need [name] sooner.  Please contact office for sooner follow up if symptoms do not improve or worsen or seek emergency care   Adenocarcinoma of the lung 69 y/o F, never smoker, who was admitted 8/31  for planned bronchoscopy.  The patient has a history of GERD, HTN, HLD, hypothyroidism, RLS and COVID-19 in November 2020 (s/p Ab infusion, vaccinated in March / April 2021) who was referred to Dr. Lamonte Sakai for evaluation of abnormal chest CT. The patient noted she developed left neck fullness over the last several months, post receiving the COVID vaccine with evolving pain in her left cheek. Symptoms prompted a CT scan of the neck on 10/13/19 which demonstrated asymmetry in the supraclavicular lymph nodes on the left with some stranding. Limited visualization of the upper chest showed a partially visible confluent 8 cm solid appearing spiculated opacity in the LUL. Follow up CT scan of the chest, abdomen and pelvis on 8/25 which showed an irregularly shaped 6.9 x 3.7 cm left upper lobe opacity, small left pleural effusion, 1.3 cm right hilar node and a 7 mm periaortic node. The patient was planned for bronchoscopy with electromagnetic navigation with biopsies. Transbronchial needle biopsies, Wang biopsy, BAL obtained during procedure. Post procedure CXR demonstrated an approximate 15-20% pneumothorax. A 14 french pigtail pleural catheter was  placed into the left pleural space using the Seldinger technique. The Chest tube was placed to an atrium collection system and placed on -20 cm H2O wall suction.   Pt had a small leak which resolved 9/2, at which time the patient chest tube was placed to water seal. The morning of 9/3 patient had no water seal, and CXR showed a small 5% apical pneumothorax. The chest tube was clamped and CXR obtained at 1600 to determine ability to remove chest tube and discharge home.   Biopsy was positive for adenocarcinoma of the lung. Referral has been made to [facility] per Dr. Lamonte Sakai . Patient will need an OP PET scan and follow up at the pulmonary office with CXR early next week.   8/31 CXR Left pneumothorax estimated at 15-20%. Known mass in the left upper lobe. Interstitial prominence in the left lung. Prominence of the left hilum.  10/23/2019 CXR No significant interval change in AP portable chest radiograph. There may be a very minimal, less than 5% left apical pneumothorax, not significantly changed compared to prior examination. Left-sided chest tube remains in position. Procedures   10/20/2019>>  A 14 french pigtail pleural catheter was placed into the left pleural space using the Seldinger technique. The Chest tube was placed to an atrium collection system and placed on -20 cm H2O wall suction.    Culture data/antimicrobials   10/20/2019 Cytology>>  FINAL MICROSCOPIC DIAGNOSIS:  A. LUNG, LUL, BRUSHING:  - Malignant cells consistent with adenocarcinoma   B. LUNG, LUL, FINE NEEDLE ASPIRATION:  - Atypical cells present.  10/28/2019  Hospital follow-up Patient was discharged from the hospital 10/23/2019 after resolution of left pneumothorax.  She states she has a moderate amount of post procedure pain.  She is taking Vicodin 1/2 tablet or 2.5 mg about once daily usually at bedtime to manage this.  She did have some scant bloody secretions post procedure however they have cleared.   She states that she has had increased secretions in the morning but that they are clear.  Left apical chest tube site is healed.  There is no drainage or redness around the site.  Patient denies any fever, however she does complain of tender nodules in her neck on the left side.  She states that they feel tight and sometimes make her throat hurt when she is breathing through her mouth.  Overall she feels she is doing well.  She is very anxious and concerned about the fact that she has lung cancer.  We discussed that she has an excellent team.  She has an appointment with Dr. Earlie Server 11/03/2019.  She has not yet had PET scan imaging done so I will order that today with the goal that it is available for Dr. Earlie Server to review at her appointment.  She is anxious about treatment options and I have assured her that Dr. Earlie Server will review the treatment options with her after he sees her.  Patient is accompanied today by her husband who is the owner of [employer] in [place].  They both asked very appropriate questions and are appropriately concerned.  We did discuss Radon  testing in their basement, as she is a never smoker.  I am hoping that there was adequate tissue for genetic testing in this patient who is a never smoker.CXR today shows complete resolution of her pneumothorax.   Test Results: CXR 10/28/2019 IMPRESSION: Removal of left-sided chest tube without pneumothorax. Persistent ill-defined left upper lobe opacity with surrounding interstitial changes  10/23/2019 CXR Left chest tube in place.  No pneumothorax. Unchanged focal opacity within the left upper lobe.  CBC Latest Ref Rng & Units 10/21/2019 10/16/2019 09/10/2018  WBC 4.0 - 10.5 K/uL 8.8 14.6(H) 10.9(H)  Hemoglobin 12.0 - 15.0 g/dL 12.3 14.1 11.7(L)  Hematocrit 36 - 46 % 37.1 40.9 35.3(L)  Platelets 150 - 400 K/uL 243 314.0 227    BMP Latest Ref Rng & Units 10/21/2019 10/16/2019 10/13/2019  Glucose 70 - 99 mg/dL 149(H) 177(H) -    BUN 8 - 23 mg/dL 11 15 -  Creatinine 0.44 - 1.00 mg/dL 0.81 0.77 0.70  Sodium 135 - 145 mmol/L 137 135 -  Potassium 3.5 - 5.1 mmol/L 4.1 4.1 -  Chloride 98 - 111 mmol/L 101 100 -  CO2 22 - 32 mmol/L 24 23 -  Calcium 8.9 - 10.3 mg/dL 8.9 10.3 -    BNP No results found for: BNP  ProBNP No results found for: PROBNP  PFT No results found for: FEV1PRE, FEV1POST, FVCPRE, FVCPOST, TLC, DLCOUNC, PREFEV1FVCRT, PSTFEV1FVCRT  X-ray chest PA or AP  Result Date: 10/20/2019 CLINICAL DATA:  Chest tube placement EXAM: CHEST  1 VIEW COMPARISON:  10/20/2019 FINDINGS: Interval placement of left-sided chest tube, pigtail projects over the left mediastinal silhouette. Decreased left pneumothorax with small residual at the apex. Left upper lobe lung mass. Small left-sided pleural effusion. Airspace disease at the left base. IMPRESSION: 1. Interval placement of left-sided chest tube with tip projecting over the upper left mediastinal silhouette, decreased left pneumothorax. Small residual left apical pneumothorax. 2. Left upper  lobe lung mass. Small left effusion with left basilar atelectasis or infiltrate. Electronically Signed   By: Donavan Foil M.D.   On: 10/20/2019 18:40   CT SOFT TISSUE NECK W CONTRAST  Addendum Date: 10/13/2019   ADDENDUM REPORT: 10/13/2019 10:09 ADDENDUM: Study discussed by telephone with Dr. Carlis Abbott on 10/13/2019 at 1002 hours. Electronically Signed   By: Genevie Ann M.D.   On: 10/13/2019 10:09   Result Date: 10/13/2019 CLINICAL DATA:  69 year old female with left side neck fullness for 9-10 months. Pain at the left cheek. EXAM: CT NECK WITH CONTRAST TECHNIQUE: Multidetector CT imaging of the neck was performed using the standard protocol following the bolus administration of intravenous contrast. CONTRAST:  57m OMNIPAQUE IOHEXOL 300 MG/ML  SOLN COMPARISON:  Chest CT 07/23/2003. FINDINGS: Pharynx and larynx: The glottis is closed. The larynx otherwise appears normal. Pharyngeal  soft tissue contours are within normal limits. Parapharyngeal and retropharyngeal spaces are within normal limits. Salivary glands: Sublingual space is mildly asymmetric (series 2, image 39) possibly related to asymmetric atrophy of the left sublingual gland. The submandibular glands and parotid glands appear within normal limits. Thyroid: Negative. Lymph nodes: Marked area of clinical concern is in the left supraclavicular region on series 2, image 53 and coronal image 39. Subcutaneous fat immediately below the skin marker is unremarkable, but there are regional asymmetric increased left level IIIb lymph nodes as seen on coronal images 56 through 54, the largest 10 mm short axis. And there is subtle stranding around the largest node there. No cystic or necrotic nodes. No other inflammatory stranding. Other bilateral cervical lymph nodes are symmetric, diminutive, and normal. Vascular: Major vascular structures in the neck and at the skull base appear patent with no significant atherosclerosis. Limited intracranial: Negative. Visualized orbits: Negative. Mastoids and visualized paranasal sinuses: Clear. Skeleton: No acute or suspicious osseous lesion identified. Multilevel degeneration in the cervical spine appears typical for age. Upper chest: Abnormal left upper lung. There is partially visible confluent at least 8 cm solid appearing and mildly spiculated opacity in the left upper lobe (coronal image 32 and series 3, image 111) with a superimposed small layering left pleural effusion. The left upper lung was clear and normal on the 2005 CT. Few superior mediastinal lymph nodes are included on these images, although there are subcentimeter but asymmetric left chest subclavian and subpectoral nodes (series 2 image 96). The contralateral right upper lobe appears negative. IMPRESSION: 1. Partially visible abnormal left upper lung with large, at least 8 cm solid-appearing mildly spiculated lung opacity. This is  suspicious for bronchogenic carcinoma although infection might be possible. There is a small layering left pleural effusion. Recommend follow-up IV contrast enhanced Chest CT (or alternatively CT Chest, Abdomen, and Pelvis) to further characterize. 2. Borderline to mildly enlarged left level IIIb lymph nodes in the lower neck correspond to the palpable abnormality. These are nonspecific, but along with asymmetric upper chest nodes are suspicious for nodal metastases in the setting of #1. Electronically Signed: By: HGenevie AnnM.D. On: 10/13/2019 09:35   CT CHEST ABDOMEN PELVIS W CONTRAST  Result Date: 10/14/2019 CLINICAL DATA:  Neck swelling. Neck CT demonstrating a left upper lobe lung mass. EXAM: CT CHEST, ABDOMEN, AND PELVIS WITH CONTRAST TECHNIQUE: Multidetector CT imaging of the chest, abdomen and pelvis was performed following the standard protocol during bolus administration of intravenous contrast. CONTRAST:  1010mOMNIPAQUE IOHEXOL 300 MG/ML  SOLN COMPARISON:  Neck CT 10/13/2019. Chest radiograph 05/15/2010. Abdominopelvic CT from [facility]  01/07/2019. FINDINGS: CT CHEST FINDINGS Cardiovascular: Aortic atherosclerosis. Tortuous thoracic aorta. Normal heart size, without pericardial effusion. Mediastinum/Nodes: Node 1.3 cm right hilar node on 32/2 is upper normal sized. Small prevascular nodes are not pathologic by size criteria. Tiny hiatal hernia. An upper normal sized periaortic node of 7 mm on 49/2 for. Lungs/Pleura: Small left pleural effusion. 3 mm left lower lobe pulmonary nodule on 98/4. Left upper lobe lung mass measures 6.9 x 3.7 cm on 60/4. 2.8 cm on sagittal image 128. Surrounding septal thickening Musculoskeletal: No acute osseous abnormality. CT ABDOMEN PELVIS FINDINGS Hepatobiliary: Suspicion of mild hepatic steatosis. No focal liver lesion. Normal gallbladder, without biliary ductal dilatation. Pancreas: Normal, without mass or ductal dilatation. Spleen: Normal in size, without focal  abnormality. Adrenals/Urinary Tract: Normal left adrenal gland. Right adrenal 1.0 x 1.3 cm nodule on 62/2 is similar to 01/07/2019. Right renal cysts of up to 4.0 cm. Normal urinary bladder. Stomach/Bowel: Normal remainder of the stomach. Scattered colonic diverticula. Normal terminal ileum. Normal small bowel. Vascular/Lymphatic: Aortic atherosclerosis. No abdominopelvic adenopathy. Reproductive: Hysterectomy.  No adnexal mass. Other: No significant free fluid. No evidence of omental or peritoneal disease. Musculoskeletal: Lumbosacral spondylosis. Left femoral head avascular necrosis is similar to on the prior. IMPRESSION: 1. Left upper lobe lung mass, suspicious for primary bronchogenic carcinoma. Surrounding interstitial thickening is suspicious for lymphangitic tumor spread. Given morphology (including surrounding septal thickening), and lack of smoking history, pneumonia is possible but felt less likely. If the patient has infectious symptoms, consider antibiotic therapy and short-term CT follow-up. If not, consider multidisciplinary thoracic oncology consultation for tissue sampling and possible PET. 2. Prominent but not pathologically sized thoracic nodes, indeterminate. 3. Small left pleural effusion. 4. No findings of abdominopelvic metastatic disease. 5. A right adrenal nodule is technically indeterminate, but stable compared to a CT of 01/07/2019, favoring a benign etiology. 6.  Tiny hiatal hernia. 7.  Aortic Atherosclerosis (ICD10-I70.0). 8. Left femoral head avascular necrosis. Electronically Signed   By: Abigail Miyamoto M.D.   On: 10/14/2019 09:25   DG CHEST PORT 1 VIEW  Result Date: 10/23/2019 CLINICAL DATA:  Pneumothorax EXAM: PORTABLE CHEST 1 VIEW COMPARISON:  None. FINDINGS: The lungs are well expanded. Focal opacity within the left upper lobe is again visualized and is unchanged. Left upper lung zone pigtail chest tube is again seen is unchanged in position. There is no pneumothorax or pleural  effusion. Subcutaneous gas within the left chest wall has improved in the interval. Cardiac size within normal limits. The pulmonary vascularity is normal. IMPRESSION: 1. Left chest tube in place.  No pneumothorax. 2. Unchanged focal opacity within the left upper lobe. Electronically Signed   By: Fidela Salisbury MD   On: 10/23/2019 17:39   DG Chest Port 1 View  Result Date: 10/23/2019 CLINICAL DATA:  Chest tube, pneumothorax, left lung mass EXAM: PORTABLE CHEST 1 VIEW COMPARISON:  10/22/2019 FINDINGS: No significant interval change in AP portable chest radiograph. There may be a very minimal, less than 5% left apical pneumothorax, not significantly changed compared to prior examination. Heterogeneous opacity of the left midlung. Left-sided pigtail chest tube remains in position. The heart and mediastinum are unremarkable. IMPRESSION: No significant interval change in AP portable chest radiograph. There may be a very minimal, less than 5% left apical pneumothorax, not significantly changed compared to prior examination. Left-sided chest tube remains in position. Electronically Signed   By: Eddie Candle M.D.   On: 10/23/2019 08:12   DG Chest [facility] [address]  Result Date: 10/22/2019 CLINICAL DATA:  Pneumothorax.  Chest tube EXAM: PORTABLE CHEST 1 VIEW COMPARISON:  10/22/2019 FINDINGS: Pigtail chest tube on the left is unchanged in position. Tiny left apical pneumothorax approximately 5 mm. Patchy airspace disease on the left unchanged. No effusion. Right lung remains clear. IMPRESSION: Tiny left apical pneumothorax. Patchy airspace disease left upper lobe unchanged, possible mass lesion based on CT. Electronically Signed   By: Franchot Gallo M.D.   On: 10/22/2019 16:18   DG Chest Port 1 View  Result Date: 10/22/2019 CLINICAL DATA:  LEFT-sided pneumothorax EXAM: PORTABLE CHEST 1 VIEW COMPARISON:  Multiple prior chest radiographs. Including study from 10/21/2019 is most recent prior FINDINGS: Slight rotation to the  RIGHT on current exam. Accounting for this cardiomediastinal contours are stable. LEFT upper lobe mass as before. The tiny LEFT apical pneumothorax that was seen on the previous exam is not currently seen. There may be a very small lateral component adjacent to the mass and fissural distortion in the lateral LEFT chest. On limited assessment skeletal structures without acute process. LEFT-sided chest tube likely retracted slightly since the previous exam based on appearance. IMPRESSION: LEFT-sided chest tube perhaps retracted slightly since previous imaging. Possible tiny pneumothorax remaining laterally adjacent to fissural distortion along the lateral margin of the known mass. Electronically Signed   By: Zetta Bills M.D.   On: 10/22/2019 08:32   DG Chest Port 1 View  Result Date: 10/21/2019 CLINICAL DATA:  Pneumothorax. EXAM: PORTABLE CHEST 1 VIEW COMPARISON:  10/20/2019 FINDINGS: 0629 hours. Hyperexpansion again noted. Tiny left apical pneumothorax is similar with left pleural drain in situ. The ill-defined left upper lobe lung lesion again noted. There is collapse/consolidation in the left base. The visualized bony structures of the thorax show no acute abnormality. Telemetry leads overlie the chest. IMPRESSION: 1. No substantial interval change in exam. Tiny left apical pneumothorax with left pleural drain in situ. 2. Left base collapse/consolidation. Electronically Signed   By: Misty Stanley M.D.   On: 10/21/2019 06:40   DG Chest Port 1 View  Result Date: 10/20/2019 CLINICAL DATA:  Follow-up of pneumothorax EXAM: PORTABLE CHEST 1 VIEW COMPARISON:  Earlier today 1:45 p.m. FINDINGS: Midline trachea. Normal heart size. No mediastinal shift. No pleural fluid. Clear right lung. Enlargement of an approximately 40% left-sided pneumothorax. Underlying left-sided atelectasis and perihilar mass, suboptimally evaluated. IMPRESSION: Enlargement of an approximately 40% left-sided pneumothorax, without mediastinal  shift to suggest tension. These results will be called to the ordering clinician or representative by the Radiologist Assistant, and communication documented in the PACS or [employer]. Electronically Signed   By: Abigail Miyamoto M.D.   On: 10/20/2019 17:46   DG Chest Port 1 View  Result Date: 10/20/2019 CLINICAL DATA:  Bronchoscopy and biopsy today. EXAM: PORTABLE CHEST 1 VIEW COMPARISON:  CT 10/14/2019 FINDINGS: Heart size is normal. Left pneumothorax estimated at 15-20%. Known mass in the left upper lobe. Interstitial prominence in the left lung. Prominence of the left hilum. Right chest is clear. IMPRESSION: Left pneumothorax estimated at 15-20%. Known mass in the left upper lobe. Interstitial prominence in the left lung. Prominence of the left hilum. Call report in progress. Electronically Signed   By: Nelson Chimes M.D.   On: 10/20/2019 14:20   DG C-ARM BRONCHOSCOPY  Result Date: 10/20/2019 C-ARM BRONCHOSCOPY: Fluoroscopy was utilized by the requesting physician.  No radiographic interpretation.     Past medical hx Past Medical History:  Diagnosis Date  . Anemia    as  a child  . Anxiety   . Bursitis   . Chronic reflux esophagitis   . Complication of anesthesia   . Diarrhea, functional   . Diverticulosis   . GERD (gastroesophageal reflux disease)   . History of kidney stones   . Hypertension   . Knee pain    right knee-seeing ortho  . Osteoarthritis   . Plantar fasciitis   . PONV (postoperative nausea and vomiting)    has used the patch before and that helps  . Pure hypercholesterolemia   . RLS (restless legs syndrome)   . Superficial vein thrombosis   . Thyroid disease    hypothyroid     Social History   Tobacco Use  . Smoking status: Never Smoker  . Smokeless tobacco: Never Used  Vaping Use  . Vaping Use: Never used  Substance Use Topics  . Alcohol use: Yes    Alcohol/week: 2.0 - 3.0 standard drinks    Types: 2 - 3 Glasses of wine per week  . Drug use: No     Ms.Chestnutt reports that she has never smoked. She has never used smokeless tobacco. She reports current alcohol use of about 2.0 - 3.0 standard drinks of alcohol per week. She reports that she does not use drugs.  Tobacco Cessation: Never smoker  Past surgical hx, Family hx, Social hx all reviewed.  Current Outpatient Medications on File Prior to Visit  Medication Sig  . diphenoxylate-atropine (LOMOTIL) 2.5-0.025 MG tablet Take 1 tablet by mouth daily as needed for diarrhea or loose stools.   . famotidine (PEPCID) 40 MG tablet Take 40 mg by mouth See admin instructions. Five times a week  . fexofenadine (ALLEGRA) 180 MG tablet Take 180 mg by mouth daily.  . fluticasone (FLONASE) 50 MCG/ACT nasal spray Place 2 sprays into both nostrils daily.  Marland Kitchen HYDROcodone-acetaminophen (NORCO/VICODIN) 5-325 MG tablet Take 0.5 tablets by mouth daily as needed (Knee pain).   Marland Kitchen HYDROcodone-homatropine (HYCODAN) 5-1.5 MG/5ML syrup Take 5 mLs by mouth daily as needed for cough.  Marland Kitchen ibuprofen (ADVIL) 200 MG tablet Take 400 mg by mouth every 6 (six) hours as needed (Knee pain).  . LORazepam (ATIVAN) 0.5 MG tablet Take 0.5 mg by mouth at bedtime.   . melatonin 5 MG TABS Take 5 mg by mouth at bedtime.  Marland Kitchen oxymetazoline (AFRIN) 0.05 % nasal spray Place 1 spray into both nostrils daily as needed for congestion.  . RABEprazole (ACIPHEX) 20 MG tablet Take 1 tablet (20 mg total) by mouth 2 (two) times a week.  Marland Kitchen SYNTHROID 112 MCG tablet Take 112 mcg by mouth daily before breakfast.   . metoprolol succinate (TOPROL XL) 50 MG 24 hr tablet Take 50 mg by mouth daily.    No current facility-administered medications on file prior to visit.     Allergies  Allergen Reactions  . Statins     Joint pain    Review Of Systems:  Constitutional:   No  weight loss, night sweats,  Fevers, chills, + fatigue, or  lassitude.  HEENT:   + occasional  headaches,  Difficulty swallowing,  Tooth/dental problems, or  Sore throat,                 No sneezing, itching, ear ache, nasal congestion, post nasal drip,   CV:  + chest tube site  pain,  No Orthopnea, PND, swelling in lower extremities, anasarca, dizziness, palpitations, syncope.   GI  No heartburn, indigestion, abdominal pain, nausea, vomiting, diarrhea, change  in bowel habits, loss of appetite, bloody stools.   Resp: No shortness of breath with exertion or at rest.  + excess mucus, no productive cough,  No non-productive cough,  No coughing up of blood.  No change in color of mucus.  No wheezing.  No chest wall deformity  Skin: no rash or lesions.  GU: no dysuria, change in color of urine, no urgency or frequency.  No flank pain, no hematuria   MS:  No joint pain or swelling.  No decreased range of motion.  No back pain.  Psych:  No change in mood or affect. No depression or anxiety.  No memory loss.   Vital Signs BP 132/72 (BP Location: Left Arm, Cuff Size: Normal)   Pulse 87   Temp (!) 97.3 F (36.3 C) (Oral)   Ht _0  (1.651 m)   Wt 181 lb 1.6 oz (82.1 kg)   LMP 08/21/2002   SpO2 96%   BMI 30.14 kg/m    Physical Exam:  General- No distress,  A&Ox3, pleasant ENT: No sinus tenderness, TM clear, pale nasal mucosa, no oral exudate,no post nasal drip, no LAN Cardiac: S1, S2, regular rate and rhythm, no murmur Chest: No wheeze/ rales/ dullness; no accessory muscle use, no nasal flaring, no sternal retractions Abd.: Soft Non-tender, ND, BS +, Body mass index is 30.14 kg/m. Ext: No clubbing cyanosis, edema Neuro:  normal strength, MAE x 4, A&O x 3 Skin: No rashes, warm and dry, L CT site healed without redness or drainage Psych: normal mood and behavior, anxious   Assessment/Plan  New diagnosis adenocarcinoma in a non-smoker Plan CXR we will call you with the results. We have ordered a PET scan to allow [name] to stage your cancer. You will get a call to get this scheduled.  Goal is to have this scheduled prior to your office visit with Dr.  Earlie Server on 9/14 Follow up with Dr. Inda Merlin as is scheduled 9/14  Follow up with Dr. Lamonte Sakai as scheduled 9/29 as is scheduled.  Call us if you need [name] sooner.  Please contact office for sooner follow up if symptoms do not improve or worsen or seek emergency care   Resolved iatrogenic pneumothorax after biopsy 831 Chest x-ray today shows resolution of pneumothorax Site has healed completely Plan Chest x-ray done today Continue to monitor Follow-up with Dr. Lamonte Sakai 9/29 Call for any drainage or redness around left chest tube site Call for fever Call or seek emergency care for any shortness of breath that does not resolve, or pain in your chest   Magdalen Spatz, NP 10/28/2019  3:55 PM

## 2019-10-28 NOTE — Progress Notes (Signed)
I received referral on Linda Woods.  I updated new patient coordinator to call and schedule her to be seen on 11/03/19 with Dr. Julien Nordmann.

## 2019-10-29 ENCOUNTER — Other Ambulatory Visit: Payer: Self-pay | Admitting: *Deleted

## 2019-10-29 ENCOUNTER — Encounter: Payer: Self-pay | Admitting: Acute Care

## 2019-10-29 NOTE — Progress Notes (Signed)
The proposed treatment discussed in cancer conference is for discussion purpose only and is not a binding recommendation. The patient was not physically examined nor present for their treatment options. Therefore, final treatment plans cannot be decided.  ?

## 2019-11-02 ENCOUNTER — Other Ambulatory Visit: Payer: Self-pay

## 2019-11-02 ENCOUNTER — Ambulatory Visit (HOSPITAL_COMMUNITY)
Admission: RE | Admit: 2019-11-02 | Discharge: 2019-11-02 | Disposition: A | Discharge status: Home / Self Care | Payer: Medicare Other | Source: Ambulatory Visit | Attending: Acute Care | Admitting: Acute Care

## 2019-11-02 DIAGNOSIS — R918 Other nonspecific abnormal finding of lung field: Secondary | ICD-10-CM | POA: Insufficient documentation

## 2019-11-02 DIAGNOSIS — K5730 Diverticulosis of large intestine without perforation or abscess without bleeding: Secondary | ICD-10-CM | POA: Diagnosis not present

## 2019-11-02 DIAGNOSIS — K760 Fatty (change of) liver, not elsewhere classified: Secondary | ICD-10-CM | POA: Insufficient documentation

## 2019-11-02 DIAGNOSIS — K449 Diaphragmatic hernia without obstruction or gangrene: Secondary | ICD-10-CM | POA: Insufficient documentation

## 2019-11-02 DIAGNOSIS — J189 Pneumonia, unspecified organism: Secondary | ICD-10-CM | POA: Diagnosis not present

## 2019-11-02 DIAGNOSIS — I700 Atherosclerosis of aorta: Secondary | ICD-10-CM | POA: Insufficient documentation

## 2019-11-02 LAB — GLUCOSE, CAPILLARY: Glucose-Capillary: 103 mg/dL — ABNORMAL HIGH (ref 70–99)

## 2019-11-02 MED ORDER — FLUDEOXYGLUCOSE F - 18 (FDG) INJECTION
8.5000 | Freq: Once | INTRAVENOUS | Status: AC
  Administered 2019-11-02: 8.5 via INTRAVENOUS

## 2019-11-03 ENCOUNTER — Telehealth: Payer: Self-pay | Admitting: *Deleted

## 2019-11-03 ENCOUNTER — Encounter: Payer: Self-pay | Admitting: *Deleted

## 2019-11-03 ENCOUNTER — Inpatient Hospital Stay: Payer: Medicare Other

## 2019-11-03 ENCOUNTER — Inpatient Hospital Stay: Payer: Medicare Other | Attending: Internal Medicine | Admitting: Internal Medicine

## 2019-11-03 ENCOUNTER — Encounter: Payer: Self-pay | Admitting: Internal Medicine

## 2019-11-03 ENCOUNTER — Other Ambulatory Visit: Payer: Self-pay

## 2019-11-03 VITALS — BP 142/82 | HR 80 | Temp 97.6°F | Resp 20 | Ht 65.0 in | Wt 177.1 lb

## 2019-11-03 DIAGNOSIS — I10 Essential (primary) hypertension: Secondary | ICD-10-CM

## 2019-11-03 DIAGNOSIS — J90 Pleural effusion, not elsewhere classified: Secondary | ICD-10-CM | POA: Diagnosis not present

## 2019-11-03 DIAGNOSIS — R918 Other nonspecific abnormal finding of lung field: Secondary | ICD-10-CM

## 2019-11-03 DIAGNOSIS — Z5111 Encounter for antineoplastic chemotherapy: Secondary | ICD-10-CM | POA: Diagnosis not present

## 2019-11-03 DIAGNOSIS — C3490 Malignant neoplasm of unspecified part of unspecified bronchus or lung: Secondary | ICD-10-CM | POA: Diagnosis not present

## 2019-11-03 DIAGNOSIS — C3412 Malignant neoplasm of upper lobe, left bronchus or lung: Secondary | ICD-10-CM | POA: Diagnosis present

## 2019-11-03 DIAGNOSIS — Z7189 Other specified counseling: Secondary | ICD-10-CM

## 2019-11-03 DIAGNOSIS — C3492 Malignant neoplasm of unspecified part of left bronchus or lung: Secondary | ICD-10-CM | POA: Diagnosis not present

## 2019-11-03 LAB — CMP (CANCER CENTER ONLY)
ALT: 11 U/L (ref 0–44)
AST: 15 U/L (ref 15–41)
Albumin: 3.8 g/dL (ref 3.5–5.0)
Alkaline Phosphatase: 111 U/L (ref 38–126)
Anion gap: 9 (ref 5–15)
BUN: 9 mg/dL (ref 8–23)
CO2: 23 mmol/L (ref 22–32)
Calcium: 9.1 mg/dL (ref 8.9–10.3)
Chloride: 106 mmol/L (ref 98–111)
Creatinine: 0.73 mg/dL (ref 0.44–1.00)
GFR, Est AFR Am: >60 mL/min (ref >60)
GFR, Estimated: >60 mL/min (ref >60)
Glucose, Bld: 118 mg/dL — ABNORMAL HIGH (ref 70–99)
Potassium: 3.4 mmol/L — ABNORMAL LOW (ref 3.5–5.1)
Sodium: 138 mmol/L (ref 135–145)
Total Bilirubin: 0.4 mg/dL (ref 0.3–1.2)
Total Protein: 6.8 g/dL (ref 6.5–8.1)

## 2019-11-03 LAB — CBC WITH DIFFERENTIAL (CANCER CENTER ONLY)
Abs Immature Granulocytes: 0.02 10*3/uL (ref 0.00–0.07)
Basophils Absolute: 0 10*3/uL (ref 0.0–0.1)
Basophils Relative: 0 %
Eosinophils Absolute: 0.2 10*3/uL (ref 0.0–0.5)
Eosinophils Relative: 3 %
HCT: 37.5 % (ref 36.0–46.0)
Hemoglobin: 12.8 g/dL (ref 12.0–15.0)
Immature Granulocytes: 0 %
Lymphocytes Relative: 30 %
Lymphs Abs: 1.5 10*3/uL (ref 0.7–4.0)
MCH: 31.1 pg (ref 26.0–34.0)
MCHC: 34.1 g/dL (ref 30.0–36.0)
MCV: 91.2 fL (ref 80.0–100.0)
Monocytes Absolute: 0.5 10*3/uL (ref 0.1–1.0)
Monocytes Relative: 10 %
Neutro Abs: 2.9 10*3/uL (ref 1.7–7.7)
Neutrophils Relative %: 57 %
Platelet Count: 255 10*3/uL (ref 150–400)
RBC: 4.11 MIL/uL (ref 3.87–5.11)
RDW: 12 % (ref 11.5–15.5)
WBC Count: 5.2 10*3/uL (ref 4.0–10.5)
nRBC: 0 % (ref 0.0–0.2)

## 2019-11-03 MED ORDER — PROCHLORPERAZINE MALEATE 10 MG PO TABS: 10.0000 mg | ORAL_TABLET | Freq: Four times a day (QID) | ORAL | 0 refills | Status: DC | PRN

## 2019-11-03 NOTE — Progress Notes (Signed)
START ON PATHWAY REGIMEN - Non-Small Cell Lung     Administer weekly:     Paclitaxel      Carboplatin   **Always confirm dose/schedule in your pharmacy ordering system**  Patient Characteristics: Preoperative or Nonsurgical Candidate (Clinical Staging), Stage III - Nonsurgical Candidate (Nonsquamous and Squamous), PS = 0, 1 Therapeutic Status: Preoperative or Nonsurgical Candidate (Clinical Staging) AJCC T Category: cT3 AJCC N Category: cN3 AJCC M Category: cM0 AJCC 8 Stage Grouping: IIIC ECOG Performance Status: 1 Intent of Therapy: Curative Intent, Discussed with Patient

## 2019-11-03 NOTE — Progress Notes (Signed)
[place] [phone]   [phone]  CONSULT NOTE  REFERRING PHYSICIAN: Dr. Baltazar Apo  REASON FOR CONSULTATION:  69 years old white female recently diagnosed with lung cancer.  HPI Nyhla Woods is a 69 y.o. female with past medical history significant for hypertension, dyslipidemia, hypothyroidism, GERD, and anxiety, anemia, kidney stone, restless leg syndrome as well as diverticulosis.  The patient is a never smoker.  She mentions that few months ago she felt a lump in the left side of the neck.  She was seen by her primary care physician and given prescription for antibiotics with no improvement.  She had CT scan of the neck on 10/13/2019 and it showed partially visible abnormal left upper lobe lung with large at least 8 cm solid-appearing mildly spiculated lung opacity suspicious for bronchogenic carcinoma.  There was a small layering of left pleural effusion and borderline to mildly enlarged left level IIIB lymph nodes in the lower neck corresponding to the palpable abnormality.  These were nonspecific.  On 10/14/2019 the patient had CT scan of the chest, abdomen pelvis with contrast and it showed left upper lobe lung mass measuring 6.9 x 3.7 x 2.8 cm.  There was also 1.3 cm right hilar node and small prevascular nodes are not pathologic by size criteria.  There was also upper normal-sized periaortic node measuring 0.7 cm.  The scan also showed a small left pleural effusion and 0.3 cm left lower lobe pulmonary nodule.  There was also a right adrenal 1.0 x 1.3 cm nodule.  The patient was referred to Dr. Lamonte Sakai and on October 20, 2019 she underwent video bronchoscopy with electromagnetic navigation procedure.  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  The patient also had a PET scan on 11/02/2019 and it showed hypermetabolic left upper lobe lung mass with maximum SUV of 15.3 with  postobstructive pneumonitis in the left upper lobe.  There was hypermetabolic adenopathy in the chest including the contralateral right hilar lymph node and a left infrahilar node as well as subcarinal nodes.  There was a small mass of the right adrenal gland not appreciably hypermetabolic.  There was also a left level 4 lymph node in the neck measuring 0.7 cm in short axis and has activity similar to the blood, probably not malignant but require surveillance.  The patient also has a small left pleural effusion. Dr. Lamonte Sakai kindly referred the patient to me today for evaluation and recommendation regarding treatment of her condition. When seen today she continues to have pain on the left jaw area as well as left neck.  She has an anxiety.  She denied having any chest pain, shortness of breath, cough or hemoptysis.  She denied having any significant weight loss.  She has no nausea, vomiting but has chronic diarrhea secondary to diverticulosis.  The patient denied having any headache or visual changes. Family history significant for mother with cervical cancer and died at age 21.  Father had congestive heart failure and died at age 30 she also has a maternal uncle with bone cancer. The patient is married and has 3 children 2 daughters and 1 son.  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  The patient is a never smoker but drinks wine at nighttime and no history of drug abuse.  HPI  Past Medical History:  Diagnosis Date  . Anemia  as a child  . Anxiety   . Bursitis   . Chronic reflux esophagitis   . Complication of anesthesia   . Diarrhea, functional   . Diverticulosis   . GERD (gastroesophageal reflux disease)   . History of kidney stones   . Hypertension   . Knee pain    right knee-seeing ortho  . Osteoarthritis   . Plantar fasciitis   . PONV (postoperative nausea and vomiting)    has used the patch before and that helps  . Pure  hypercholesterolemia   . RLS (restless legs syndrome)   . Superficial vein thrombosis   . Thyroid disease    hypothyroid    Past Surgical History:  Procedure Laterality Date  . APPENDECTOMY    . BRONCHIAL BIOPSY  10/20/2019   Procedure: BRONCHIAL BIOPSIES;  Surgeon: Collene Gobble, MD;  Location: [facility] ENDOSCOPY;  Service: Pulmonary;;  . BRONCHIAL BRUSHINGS  10/20/2019   Procedure: BRONCHIAL BRUSHINGS;  Surgeon: Collene Gobble, MD;  Location: [facility] ENDOSCOPY;  Service: Pulmonary;;  . BRONCHIAL NEEDLE ASPIRATION BIOPSY  10/20/2019   Procedure: BRONCHIAL NEEDLE ASPIRATION BIOPSIES;  Surgeon: Collene Gobble, MD;  Location: [place];  Service: Pulmonary;;  . BRONCHIAL WASHINGS  10/20/2019   Procedure: BRONCHIAL WASHINGS;  Surgeon: Collene Gobble, MD;  Location: [facility] ENDOSCOPY;  Service: Pulmonary;;  . CARPAL TUNNEL RELEASE Right 2011   Dr. Amedeo Plenty  . COLPORRHAPHY     posterior  . HAND SURGERY Right 2016   Nerve surgery, Dr. Amedeo Plenty  . OVARIAN CYST REMOVAL    . RECTOCELE REPAIR  2011   w/TVH and sling  . TONSILLECTOMY    . TONSILLECTOMY    . TOTAL KNEE ARTHROPLASTY Left 09/09/2018   Procedure: TOTAL KNEE ARTHROPLASTY, CORTISONE INJECTION RIGHT KNEE;  Surgeon: Paralee Cancel, MD;  Location: [facility] ORS;  Service: Orthopedics;  Laterality: Left;  70 mins  . TOTAL VAGINAL HYSTERECTOMY  10/18/2009   rectocele repair, sling  . TUBAL LIGATION Bilateral   . VARICOSE VEIN SURGERY    . VIDEO BRONCHOSCOPY WITH ENDOBRONCHIAL NAVIGATION N/A 10/20/2019   Procedure: VIDEO BRONCHOSCOPY WITH ENDOBRONCHIAL NAVIGATION;  Surgeon: Collene Gobble, MD;  Location: [place] ENDOSCOPY;  Service: Pulmonary;  Laterality: N/A;    Family History  Problem Relation Age of Onset  . Cervical cancer Mother   . Heart failure Mother   . Heart failure Father   . Diabetes Father   . Cancer Brother   . Breast cancer Maternal Aunt   . Breast cancer Paternal Aunt     Social History Social History   Tobacco Use  . Smoking  status: Never Smoker  . Smokeless tobacco: Never Used  Vaping Use  . Vaping Use: Never used  Substance Use Topics  . Alcohol use: Yes    Alcohol/week: 2.0 - 3.0 standard drinks    Types: 2 - 3 Glasses of wine per week  . Drug use: No    Allergies  Allergen Reactions  . Statins     Joint pain    Current Outpatient Medications  Medication Sig Dispense Refill  . diphenoxylate-atropine (LOMOTIL) 2.5-0.025 MG tablet Take 1 tablet by mouth daily as needed for diarrhea or loose stools.     . famotidine (PEPCID) 40 MG tablet Take 40 mg by mouth See admin instructions. Five times a week    . fexofenadine (ALLEGRA) 180 MG tablet Take 180 mg by mouth daily.    . fluticasone (FLONASE) 50 MCG/ACT nasal spray Place 2 sprays into  both nostrils daily.    Marland Kitchen HYDROcodone-acetaminophen (NORCO/VICODIN) 5-325 MG tablet Take 0.5 tablets by mouth daily as needed (Knee pain).     Marland Kitchen HYDROcodone-homatropine (HYCODAN) 5-1.5 MG/5ML syrup Take 5 mLs by mouth daily as needed for cough.    Marland Kitchen ibuprofen (ADVIL) 200 MG tablet Take 400 mg by mouth every 6 (six) hours as needed (Knee pain).    . LORazepam (ATIVAN) 0.5 MG tablet Take 0.5 mg by mouth at bedtime.     . melatonin 5 MG TABS Take 5 mg by mouth at bedtime.    . metoprolol succinate (TOPROL XL) 50 MG 24 hr tablet Take 50 mg by mouth daily.     Marland Kitchen oxymetazoline (AFRIN) 0.05 % nasal spray Place 1 spray into both nostrils daily as needed for congestion.    . RABEprazole (ACIPHEX) 20 MG tablet Take 1 tablet (20 mg total) by mouth 2 (two) times a week.    Marland Kitchen SYNTHROID 112 MCG tablet Take 112 mcg by mouth daily before breakfast.      No current facility-administered medications for this visit.    Review of Systems  Constitutional: positive for fatigue Eyes: negative Ears, nose, mouth, throat, and face: negative Respiratory: negative Cardiovascular: negative Gastrointestinal: positive for diarrhea Genitourinary:negative Integument/breast:  negative Hematologic/lymphatic: negative Musculoskeletal:negative Neurological: negative Behavioral/Psych: positive for anxiety Endocrine: negative Allergic/Immunologic: negative  Physical Exam  MGQ:QPYPP, healthy, no distress, well nourished, well developed and anxious SKIN: skin color, texture, turgor are normal, no rashes or significant lesions HEAD: Normocephalic, No masses, lesions, tenderness or abnormalities EYES: normal, PERRLA, Conjunctiva are pink and non-injected EARS: External ears normal, Canals clear OROPHARYNX:no exudate, no erythema and lips, buccal mucosa, and tongue normal  NECK: supple, no adenopathy, no JVD LYMPH:  no palpable lymphadenopathy, no hepatosplenomegaly BREAST:not examined LUNGS: clear to auscultation , and palpation HEART: regular rate & rhythm, no murmurs and no gallops ABDOMEN:abdomen soft, non-tender, normal bowel sounds and no masses or organomegaly BACK: No CVA tenderness, Range of motion is normal EXTREMITIES:no joint deformities, effusion, or inflammation, no edema  NEURO: alert & oriented x 3 with fluent speech, no focal motor/sensory deficits  PERFORMANCE STATUS: ECOG 1  LABORATORY DATA: Lab Results  Component Value Date   WBC 8.8 10/21/2019   HGB 12.3 10/21/2019   HCT 37.1 10/21/2019   MCV 91.6 10/21/2019   PLT 243 10/21/2019      Chemistry      Component Value Date/Time   NA 137 10/21/2019 0327   K 4.1 10/21/2019 0327   CL 101 10/21/2019 0327   CO2 24 10/21/2019 0327   BUN 11 10/21/2019 0327   CREATININE 0.81 10/21/2019 0327      Component Value Date/Time   CALCIUM 8.9 10/21/2019 0327   ALKPHOS 102 10/16/2019 1158   AST 16 10/16/2019 1158   ALT 17 10/16/2019 1158   BILITOT 0.4 10/16/2019 1158       RADIOGRAPHIC STUDIES: X-ray chest PA or AP  Result Date: 10/20/2019 CLINICAL DATA:  Chest tube placement EXAM: CHEST  1 VIEW COMPARISON:  10/20/2019 FINDINGS: Interval placement of left-sided chest tube, pigtail  projects over the left mediastinal silhouette. Decreased left pneumothorax with small residual at the apex. Left upper lobe lung mass. Small left-sided pleural effusion. Airspace disease at the left base. IMPRESSION: 1. Interval placement of left-sided chest tube with tip projecting over the upper left mediastinal silhouette, decreased left pneumothorax. Small residual left apical pneumothorax. 2. Left upper lobe lung mass. Small left effusion with left basilar  atelectasis or infiltrate. Electronically Signed   By: Donavan Foil M.D.   On: 10/20/2019 18:40   DG Chest 2 View  Result Date: 10/28/2019 CLINICAL DATA:  History of pneumothorax. Left upper lobe lung lesion. EXAM: CHEST - 2 VIEW COMPARISON:  10/23/2019 FINDINGS: The left-sided chest tube is been removed. No pneumothorax is identified. Persistent ill-defined left upper lobe density with surrounding interstitial changes. No pleural effusion. The bony thorax is intact. IMPRESSION: Removal of left-sided chest tube without pneumothorax. Persistent ill-defined left upper lobe opacity with surrounding interstitial changes. Electronically Signed   By: Marijo Sanes M.D.   On: 10/28/2019 16:56   CT SOFT TISSUE NECK W CONTRAST  Addendum Date: 10/13/2019   ADDENDUM REPORT: 10/13/2019 10:09 ADDENDUM: Study discussed by telephone with Dr. Carlis Abbott on 10/13/2019 at 1002 hours. Electronically Signed   By: Genevie Ann M.D.   On: 10/13/2019 10:09   Result Date: 10/13/2019 CLINICAL DATA:  69 year old female with left side neck fullness for 9-10 months. Pain at the left cheek. EXAM: CT NECK WITH CONTRAST TECHNIQUE: Multidetector CT imaging of the neck was performed using the standard protocol following the bolus administration of intravenous contrast. CONTRAST:  35mL OMNIPAQUE IOHEXOL 300 MG/ML  SOLN COMPARISON:  Chest CT 07/23/2003. FINDINGS: Pharynx and larynx: The glottis is closed. The larynx otherwise appears normal. Pharyngeal soft tissue contours are within  normal limits. Parapharyngeal and retropharyngeal spaces are within normal limits. Salivary glands: Sublingual space is mildly asymmetric (series 2, image 39) possibly related to asymmetric atrophy of the left sublingual gland. The submandibular glands and parotid glands appear within normal limits. Thyroid: Negative. Lymph nodes: Marked area of clinical concern is in the left supraclavicular region on series 2, image 53 and coronal image 39. Subcutaneous fat immediately below the skin marker is unremarkable, but there are regional asymmetric increased left level IIIb lymph nodes as seen on coronal images 56 through 54, the largest 10 mm short axis. And there is subtle stranding around the largest node there. No cystic or necrotic nodes. No other inflammatory stranding. Other bilateral cervical lymph nodes are symmetric, diminutive, and normal. Vascular: Major vascular structures in the neck and at the skull base appear patent with no significant atherosclerosis. Limited intracranial: Negative. Visualized orbits: Negative. Mastoids and visualized paranasal sinuses: Clear. Skeleton: No acute or suspicious osseous lesion identified. Multilevel degeneration in the cervical spine appears typical for age. Upper chest: Abnormal left upper lung. There is partially visible confluent at least 8 cm solid appearing and mildly spiculated opacity in the left upper lobe (coronal image 32 and series 3, image 111) with a superimposed small layering left pleural effusion. The left upper lung was clear and normal on the 2005 CT. Few superior mediastinal lymph nodes are included on these images, although there are subcentimeter but asymmetric left chest subclavian and subpectoral nodes (series 2 image 96). The contralateral right upper lobe appears negative. IMPRESSION: 1. Partially visible abnormal left upper lung with large, at least 8 cm solid-appearing mildly spiculated lung opacity. This is suspicious for bronchogenic carcinoma  although infection might be possible. There is a small layering left pleural effusion. Recommend follow-up IV contrast enhanced Chest CT (or alternatively CT Chest, Abdomen, and Pelvis) to further characterize. 2. Borderline to mildly enlarged left level IIIb lymph nodes in the lower neck correspond to the palpable abnormality. These are nonspecific, but along with asymmetric upper chest nodes are suspicious for nodal metastases in the setting of #1. Electronically Signed: By: Genevie Ann  M.D. On: 10/13/2019 09:35   CT CHEST ABDOMEN PELVIS W CONTRAST  Result Date: 10/14/2019 CLINICAL DATA:  Neck swelling. Neck CT demonstrating a left upper lobe lung mass. EXAM: CT CHEST, ABDOMEN, AND PELVIS WITH CONTRAST TECHNIQUE: Multidetector CT imaging of the chest, abdomen and pelvis was performed following the standard protocol during bolus administration of intravenous contrast. CONTRAST:  154mL OMNIPAQUE IOHEXOL 300 MG/ML  SOLN COMPARISON:  Neck CT 10/13/2019. Chest radiograph 05/15/2010. Abdominopelvic CT from [facility] 01/07/2019. FINDINGS: CT CHEST FINDINGS Cardiovascular: Aortic atherosclerosis. Tortuous thoracic aorta. Normal heart size, without pericardial effusion. Mediastinum/Nodes: Node 1.3 cm right hilar node on 32/2 is upper normal sized. Small prevascular nodes are not pathologic by size criteria. Tiny hiatal hernia. An upper normal sized periaortic node of 7 mm on 49/2 for. Lungs/Pleura: Small left pleural effusion. 3 mm left lower lobe pulmonary nodule on 98/4. Left upper lobe lung mass measures 6.9 x 3.7 cm on 60/4. 2.8 cm on sagittal image 128. Surrounding septal thickening Musculoskeletal: No acute osseous abnormality. CT ABDOMEN PELVIS FINDINGS Hepatobiliary: Suspicion of mild hepatic steatosis. No focal liver lesion. Normal gallbladder, without biliary ductal dilatation. Pancreas: Normal, without mass or ductal dilatation. Spleen: Normal in size, without focal abnormality. Adrenals/Urinary Tract:  Normal left adrenal gland. Right adrenal 1.0 x 1.3 cm nodule on 62/2 is similar to 01/07/2019. Right renal cysts of up to 4.0 cm. Normal urinary bladder. Stomach/Bowel: Normal remainder of the stomach. Scattered colonic diverticula. Normal terminal ileum. Normal small bowel. Vascular/Lymphatic: Aortic atherosclerosis. No abdominopelvic adenopathy. Reproductive: Hysterectomy.  No adnexal mass. Other: No significant free fluid. No evidence of omental or peritoneal disease. Musculoskeletal: Lumbosacral spondylosis. Left femoral head avascular necrosis is similar to on the prior. IMPRESSION: 1. Left upper lobe lung mass, suspicious for primary bronchogenic carcinoma. Surrounding interstitial thickening is suspicious for lymphangitic tumor spread. Given morphology (including surrounding septal thickening), and lack of smoking history, pneumonia is possible but felt less likely. If the patient has infectious symptoms, consider antibiotic therapy and short-term CT follow-up. If not, consider multidisciplinary thoracic oncology consultation for tissue sampling and possible PET. 2. Prominent but not pathologically sized thoracic nodes, indeterminate. 3. Small left pleural effusion. 4. No findings of abdominopelvic metastatic disease. 5. A right adrenal nodule is technically indeterminate, but stable compared to a CT of 01/07/2019, favoring a benign etiology. 6.  Tiny hiatal hernia. 7.  Aortic Atherosclerosis (ICD10-I70.0). 8. Left femoral head avascular necrosis. Electronically Signed   By: Abigail Miyamoto M.D.   On: 10/14/2019 09:25   NM PET Image Initial (PI) Skull Base To Thigh  Result Date: 11/02/2019 CLINICAL DATA:  Initial treatment strategy for left upper lobe adenocarcinoma. EXAM: NUCLEAR MEDICINE PET SKULL BASE TO THIGH TECHNIQUE: 8.5 mCi F-18 FDG was injected intravenously. Full-ring PET imaging was performed from the skull base to thigh after the radiotracer. CT data was obtained and used for attenuation  correction and anatomic localization. Fasting blood glucose: 103 mg/dl COMPARISON:  CT scan 10/14/2019 FINDINGS: Mediastinal blood pool activity: SUV max 2.8 Liver activity: SUV max NA NECK: Left level V lymph node measures 0.7 cm in short axis on image 30/4 (formerly 0.9 cm by my measurement) and has maximum SUV 2.8, similar to blood pool. Incidental CT findings: none CHEST: Somewhat irregular 6.6 by 3.6 cm left upper lobe mass image 26/8, maximum SUV 15.3, compatible with malignancy. Contralateral enlarged right hilar lymph node with maximum SUV 5.1, favoring malignant involvement. Left infrahilar lymph node maximum SUV 4.5. Subcarinal lymph nodes measuring up  to 1.1 cm in diameter with maximum SUV 4.4. Small left axillary lymph nodes including a 0.6 cm left axillary node on image 54 series 4 with maximum SUV 3.2. A lower thoracic para-aortic lymph node measuring 0.6 cm in short axis on image 93/4 has maximum SUV of 2.8, similar to blood pool. Incidental CT findings: Small left pleural effusion. Small amount of subcutaneous gas along the left breast likely related to prior chest tube. Mild atherosclerotic calcification of the aortic arch. Small type 1 hiatal hernia containing stomach and a small amount of fluid. Patchy postobstructive pneumonitis in the left upper lobe. ABDOMEN/PELVIS: Photopenic cyst in the right kidney upper pole. Clustered left para-aortic lymph nodes measuring up to 0.8 cm in short axis on image 118/4 with maximum SUV up to 3.2, nonspecific. Incidental CT findings: Mild hepatic steatosis. Mild prominence of the lateral segment left hepatic lobe. 1.2 by 1.0 cm nodule of the right adrenal gland with nonspecific density but no obvious accentuated metabolic activity, probably merits surveillance. Sigmoid diverticulosis. SKELETON: No significant abnormal hypermetabolic activity in this region. Incidental CT findings: Lumbar spondylosis and degenerative disc disease. There findings of chronic  avascular necrosis in the left femoral head, without flattening. IMPRESSION: 1. Hypermetabolic left upper lobe mass with maximum SUV 15.3. Postobstructive pneumonitis in the left upper lobe. Hypermetabolic adenopathy in the chest includes the contralateral right hilar lymph node; a left infrahilar node; and subcarinal nodes. 2. A small mass of the right adrenal gland is not appreciably hypermetabolic but given its small size, I would suggest surveillance. 3. A left level V lymph node in the neck measures only 0.7 cm in short axis and has a activity similar to blood. Probably not malignant but consider surveillance. 4. Other imaging findings of potential clinical significance: Small left pleural effusion. Aortic Atherosclerosis (ICD10-I70.0). Small type 1 hiatal hernia. Mild hepatic steatosis. Sigmoid diverticulosis. 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 Electronically Signed   By: Van Clines M.D.   On: 11/02/2019 17:06   DG CHEST PORT 1 VIEW  Result Date: 10/23/2019 CLINICAL DATA:  Pneumothorax EXAM: PORTABLE CHEST 1 VIEW COMPARISON:  None. FINDINGS: The lungs are well expanded. Focal opacity within the left upper lobe is again visualized and is unchanged. Left upper lung zone pigtail chest tube is again seen is unchanged in position. There is no pneumothorax or pleural effusion. Subcutaneous gas within the left chest wall has improved in the interval. Cardiac size within normal limits. The pulmonary vascularity is normal. IMPRESSION: 1. Left chest tube in place.  No pneumothorax. 2. Unchanged focal opacity within the left upper lobe. Electronically Signed   By: Fidela Salisbury MD   On: 10/23/2019 17:39   DG Chest Port 1 View  Result Date: 10/23/2019 CLINICAL DATA:  Chest tube, pneumothorax, left lung mass EXAM: PORTABLE CHEST 1 VIEW COMPARISON:  10/22/2019 FINDINGS: No significant interval change in AP portable chest radiograph. There may be a very minimal, less than  5% left apical pneumothorax, not significantly changed compared to prior examination. Heterogeneous opacity of the left midlung. Left-sided pigtail chest tube remains in position. The heart and mediastinum are unremarkable. IMPRESSION: No significant interval change in AP portable chest radiograph. There may be a very minimal, less than 5% left apical pneumothorax, not significantly changed compared to prior examination. Left-sided chest tube remains in position. Electronically Signed   By: Eddie Candle M.D.   On: 10/23/2019 08:12   DG Chest Port 1 View  Result Date: 10/22/2019  CLINICAL DATA:  Pneumothorax.  Chest tube EXAM: PORTABLE CHEST 1 VIEW COMPARISON:  10/22/2019 FINDINGS: Pigtail chest tube on the left is unchanged in position. Tiny left apical pneumothorax approximately 5 mm. Patchy airspace disease on the left unchanged. No effusion. Right lung remains clear. IMPRESSION: Tiny left apical pneumothorax. Patchy airspace disease left upper lobe unchanged, possible mass lesion based on CT. Electronically Signed   By: Franchot Gallo M.D.   On: 10/22/2019 16:18   DG Chest Port 1 View  Result Date: 10/22/2019 CLINICAL DATA:  LEFT-sided pneumothorax EXAM: PORTABLE CHEST 1 VIEW COMPARISON:  Multiple prior chest radiographs. Including study from 10/21/2019 is most recent prior FINDINGS: Slight rotation to the RIGHT on current exam. Accounting for this cardiomediastinal contours are stable. LEFT upper lobe mass as before. The tiny LEFT apical pneumothorax that was seen on the previous exam is not currently seen. There may be a very small lateral component adjacent to the mass and fissural distortion in the lateral LEFT chest. On limited assessment skeletal structures without acute process. LEFT-sided chest tube likely retracted slightly since the previous exam based on appearance. IMPRESSION: LEFT-sided chest tube perhaps retracted slightly since previous imaging. Possible tiny pneumothorax remaining laterally  adjacent to fissural distortion along the lateral margin of the known mass. Electronically Signed   By: Zetta Bills M.D.   On: 10/22/2019 08:32   DG Chest Port 1 View  Result Date: 10/21/2019 CLINICAL DATA:  Pneumothorax. EXAM: PORTABLE CHEST 1 VIEW COMPARISON:  10/20/2019 FINDINGS: 0629 hours. Hyperexpansion again noted. Tiny left apical pneumothorax is similar with left pleural drain in situ. The ill-defined left upper lobe lung lesion again noted. There is collapse/consolidation in the left base. The visualized bony structures of the thorax show no acute abnormality. Telemetry leads overlie the chest. IMPRESSION: 1. No substantial interval change in exam. Tiny left apical pneumothorax with left pleural drain in situ. 2. Left base collapse/consolidation. Electronically Signed   By: Misty Stanley M.D.   On: 10/21/2019 06:40   DG Chest Port 1 View  Result Date: 10/20/2019 CLINICAL DATA:  Follow-up of pneumothorax EXAM: PORTABLE CHEST 1 VIEW COMPARISON:  Earlier today 1:45 p.m. FINDINGS: Midline trachea. Normal heart size. No mediastinal shift. No pleural fluid. Clear right lung. Enlargement of an approximately 40% left-sided pneumothorax. Underlying left-sided atelectasis and perihilar mass, suboptimally evaluated. IMPRESSION: Enlargement of an approximately 40% left-sided pneumothorax, without mediastinal shift to suggest tension. These results will be called to the ordering clinician or representative by the Radiologist Assistant, and communication documented in the PACS or [employer]. Electronically Signed   By: Abigail Miyamoto M.D.   On: 10/20/2019 17:46   DG Chest Port 1 View  Result Date: 10/20/2019 CLINICAL DATA:  Bronchoscopy and biopsy today. EXAM: PORTABLE CHEST 1 VIEW COMPARISON:  CT 10/14/2019 FINDINGS: Heart size is normal. Left pneumothorax estimated at 15-20%. Known mass in the left upper lobe. Interstitial prominence in the left lung. Prominence of the left hilum. Right chest is  clear. IMPRESSION: Left pneumothorax estimated at 15-20%. Known mass in the left upper lobe. Interstitial prominence in the left lung. Prominence of the left hilum. Call report in progress. Electronically Signed   By: Nelson Chimes M.D.   On: 10/20/2019 14:20   DG C-ARM BRONCHOSCOPY  Result Date: 10/20/2019 C-ARM BRONCHOSCOPY: Fluoroscopy was utilized by the requesting physician.  No radiographic interpretation.    ASSESSMENT: This is a very pleasant 69 years old white female recently diagnosed with at least stage IIIc (T3, N3,  M0) non-small cell lung cancer, adenocarcinoma presented with large left upper lobe lung mass in addition to left infrahilar and right hilar and subcarinal lymphadenopathy diagnosed in August 2021.   PLAN: I had a lengthy discussion with the patient and her husband today about her current disease stage, prognosis and treatment options. I personally and independently reviewed the PET scan images and discussed the result and showed the images to the patient and her husband. I recommended for the patient to complete the staging work-up by ordering MRI of the brain to rule out brain metastasis.  The patient is claustrophobic and requested open MRI. I also recommended for the patient to send the tissue block if sufficient for molecular studies to identify any actionable mutations. I discussed with the patient her treatment options and for a stage IIIc will consider her for a course of concurrent chemoradiation with weekly carboplatin for AUC of 2 and paclitaxel 45 mg/M2 concurrent with radiation for 6-7 weeks followed by consolidation treatment with immunotherapy as the patient has no evidence for disease progression after the induction phase. I discussed with the patient the adverse effect of this treatment including but not limited to alopecia, myelosuppression, nausea and vomiting, peripheral neuropathy, liver or renal dysfunction. The patient will have a chemotherapy education  class before starting the first dose of her treatment. I will call her pharmacy with prescription for Compazine 10 mg p.o. every 6 hours as needed for nausea. The patient will come back for follow-up visit with the start of the first dose of her chemotherapy. She was advised to call immediately if she has any other concerning symptoms in the interval. The patient voices understanding of current disease status and treatment options and is in agreement with the current care plan.  All questions were answered. The patient knows to call the clinic with any problems, questions or concerns. We can certainly see the patient much sooner if necessary.  Thank you so much for allowing me to participate in the care of Linda Woods. I will continue to follow up the patient with you and assist in her care.  The total time spent in the appointment was 90 minutes.  Disclaimer: This note was dictated with voice recognition software. Similar sounding words can inadvertently be transcribed and may not be corrected upon review.   Eilleen Kempf November 03, 2019, 2:20 PM

## 2019-11-03 NOTE — Telephone Encounter (Signed)
Per Dr. Julien Nordmann, I updated pathology dept that Dr. Julien Nordmann would like Foundation one and PDL 1 on recent bx.

## 2019-11-04 ENCOUNTER — Encounter: Payer: Self-pay | Admitting: *Deleted

## 2019-11-04 ENCOUNTER — Ambulatory Visit (HOSPITAL_COMMUNITY): Payer: Medicare Other

## 2019-11-04 NOTE — Progress Notes (Signed)
I followed up on Linda Woods's schedule. Her MRI Brain is scheduled 10/5 and she is set up to see Rad Onc on 9/30.

## 2019-11-05 ENCOUNTER — Telehealth: Payer: Self-pay | Admitting: Internal Medicine

## 2019-11-05 NOTE — Telephone Encounter (Signed)
Scheduled per los. Called and spoke with patient. Confirmed appts  

## 2019-11-05 NOTE — Progress Notes (Signed)
These results were reviewed with the patient at her OV with Dr. Earlie Server 9/14. She has an MRI brain scheduled and she has treatment plan documented.

## 2019-11-06 ENCOUNTER — Other Ambulatory Visit: Payer: Self-pay | Admitting: Oncology

## 2019-11-06 ENCOUNTER — Ambulatory Visit
Admission: RE | Admit: 2019-11-06 | Discharge: 2019-11-06 | Disposition: A | Discharge status: Home / Self Care | Payer: Medicare Other | Source: Ambulatory Visit | Attending: Internal Medicine | Admitting: Internal Medicine

## 2019-11-06 ENCOUNTER — Encounter: Payer: Self-pay | Admitting: Oncology

## 2019-11-06 ENCOUNTER — Encounter: Payer: Self-pay | Admitting: *Deleted

## 2019-11-06 DIAGNOSIS — C3490 Malignant neoplasm of unspecified part of unspecified bronchus or lung: Secondary | ICD-10-CM

## 2019-11-06 DIAGNOSIS — C3492 Malignant neoplasm of unspecified part of left bronchus or lung: Secondary | ICD-10-CM

## 2019-11-06 MED ORDER — GADOBENATE DIMEGLUMINE 529 MG/ML IV SOLN
15.0000 mL | Freq: Once | INTRAVENOUS | Status: AC | PRN
  Administered 2019-11-06: 15 mL via INTRAVENOUS

## 2019-11-06 NOTE — Progress Notes (Signed)
Linda Woods read the report of her brain MRI and was understandably alarmed.  She called for clarification.  We discussed the fact that the report is actually equivocal and what we are seeing in her cerebellum may be artifact.  She will need a better MRI and I have entered the orders for that.  She understands it will be sometime next week before we can arrange for that  At present she has no symptoms related to these lesions if they are in the lesions.  She requires no other intervention at present.

## 2019-11-10 ENCOUNTER — Inpatient Hospital Stay: Payer: Medicare Other

## 2019-11-10 ENCOUNTER — Other Ambulatory Visit: Payer: Self-pay

## 2019-11-13 ENCOUNTER — Ambulatory Visit
Admission: RE | Admit: 2019-11-13 | Discharge: 2019-11-13 | Disposition: A | Discharge status: Home / Self Care | Payer: Medicare Other | Source: Ambulatory Visit | Attending: Oncology | Admitting: Oncology

## 2019-11-13 ENCOUNTER — Other Ambulatory Visit: Payer: Self-pay

## 2019-11-13 DIAGNOSIS — C3490 Malignant neoplasm of unspecified part of unspecified bronchus or lung: Secondary | ICD-10-CM

## 2019-11-13 MED ORDER — GADOBENATE DIMEGLUMINE 529 MG/ML IV SOLN
15.0000 mL | Freq: Once | INTRAVENOUS | Status: AC | PRN
  Administered 2019-11-13: 15 mL via INTRAVENOUS

## 2019-11-13 NOTE — Progress Notes (Signed)
[facility] OFFICE PROGRESS NOTE  [name], MD [place] [address]  DIAGNOSIS: At least stage IIIc (T3, N3, M0) non-small cell lung cancer, adenocarcinoma presented with large left upper lobe lung mass in addition to left infrahilar and right hilar and subcarinal lymphadenopathy diagnosed in August 2021.  Molecular Studies: Pending  PRIOR THERAPY: None  CURRENT THERAPY: Weekly concurrent chemoradiation with carboplatin for an AUC of 2 and paclitaxel 45 mg per metered squared.  First dose expected on 11/16/2019.    INTERVAL HISTORY: Nawaal Alling 69 y.o. female returns to the clinic today for a follow-up visit.  The patient is feeling well today without any concerning complaints except for some left sided neck tenderness which is the symptoms that prompted her initial evaluation into her recent lung cancer diagnosis. She takes NSAIDs with relief.  The patient is scheduled to start her first cycle of weekly concurrent chemoradiation today.  She is scheduled to have a consultation with Dr. Sondra Come on 11/19/2019.  Today she denies any recent fever, chills, night sweats, or weight loss.  She denies any chest pain or hemoptysis. She has mild dyspnea on exertion with taking the stairs. She reports baseline productive cough which produces clear sputum. She denies any nausea, diarrhea, constipation, or vomiting. She denies any headache or visual changes.  The patient had a brain MRI performed on Friday, 11/13/2019 for equivocal abnormalities in the cerebellum to rule out brain metastases which was negative.  She is here today for evaluation before starting cycle number one of her treatment.    MEDICAL HISTORY: Past Medical History:  Diagnosis Date  . Anemia    as a child  . Anxiety   . Bursitis   . Chronic reflux esophagitis   . Complication of anesthesia   . Diarrhea, functional   . Diverticulosis   . GERD (gastroesophageal reflux disease)   .  History of kidney stones   . Hypertension   . Knee pain    right knee-seeing ortho  . Osteoarthritis   . Plantar fasciitis   . PONV (postoperative nausea and vomiting)    has used the patch before and that helps  . Pure hypercholesterolemia   . RLS (restless legs syndrome)   . Superficial vein thrombosis   . Thyroid disease    hypothyroid    ALLERGIES:  is allergic to crestor [rosuvastatin calcium] and statins.  MEDICATIONS:  Current Outpatient Medications  Medication Sig Dispense Refill  . cholecalciferol (VITAMIN D3) 25 MCG (1000 UNIT) tablet Take 1,000 Units by mouth daily. Pt states she take 2000 units/day    . diphenoxylate-atropine (LOMOTIL) 2.5-0.025 MG tablet Take 1 tablet by mouth daily as needed for diarrhea or loose stools.     . famotidine (PEPCID) 40 MG tablet Take 40 mg by mouth See admin instructions. Five times a week    . fexofenadine (ALLEGRA) 180 MG tablet Take 180 mg by mouth daily.    . fluticasone (FLONASE) 50 MCG/ACT nasal spray Place 2 sprays into both nostrils daily.    Marland Kitchen HYDROcodone-acetaminophen (NORCO/VICODIN) 5-325 MG tablet Take 0.5 tablets by mouth daily as needed (Knee pain).     Marland Kitchen HYDROcodone-homatropine (HYCODAN) 5-1.5 MG/5ML syrup Take 5 mLs by mouth daily as needed for cough.    Marland Kitchen ibuprofen (ADVIL) 200 MG tablet Take 400 mg by mouth every 6 (six) hours as needed (Knee pain).    . LORazepam (ATIVAN) 0.5 MG tablet Take 0.5 mg by mouth at bedtime.     Marland Kitchen  melatonin 5 MG TABS Take 5 mg by mouth at bedtime.    . metoprolol succinate (TOPROL XL) 50 MG 24 hr tablet Take 50 mg by mouth daily.     Marland Kitchen oxymetazoline (AFRIN) 0.05 % nasal spray Place 1 spray into both nostrils daily as needed for congestion.    . prochlorperazine (COMPAZINE) 10 MG tablet Take 1 tablet (10 mg total) by mouth every 6 (six) hours as needed for nausea or vomiting. 30 tablet 0  . RABEprazole (ACIPHEX) 20 MG tablet Take 1 tablet (20 mg total) by mouth 2 (two) times a week.    Marland Kitchen  SYNTHROID 112 MCG tablet Take 112 mcg by mouth daily before breakfast.      No current facility-administered medications for this visit.    SURGICAL HISTORY:  Past Surgical History:  Procedure Laterality Date  . APPENDECTOMY    . BRONCHIAL BIOPSY  10/20/2019   Procedure: BRONCHIAL BIOPSIES;  Surgeon: Collene Gobble, MD;  Location: [facility] ENDOSCOPY;  Service: Pulmonary;;  . BRONCHIAL BRUSHINGS  10/20/2019   Procedure: BRONCHIAL BRUSHINGS;  Surgeon: Collene Gobble, MD;  Location: [facility] ENDOSCOPY;  Service: Pulmonary;;  . BRONCHIAL NEEDLE ASPIRATION BIOPSY  10/20/2019   Procedure: BRONCHIAL NEEDLE ASPIRATION BIOPSIES;  Surgeon: Collene Gobble, MD;  Location: [facility];  Service: Pulmonary;;  . BRONCHIAL WASHINGS  10/20/2019   Procedure: BRONCHIAL WASHINGS;  Surgeon: Collene Gobble, MD;  Location: [facility] ENDOSCOPY;  Service: Pulmonary;;  . CARPAL TUNNEL RELEASE Right 2011   Dr. Amedeo Plenty  . COLPORRHAPHY     posterior  . HAND SURGERY Right 2016   Nerve surgery, Dr. Amedeo Plenty  . OVARIAN CYST REMOVAL    . RECTOCELE REPAIR  2011   w/TVH and sling  . TONSILLECTOMY    . TONSILLECTOMY    . TOTAL KNEE ARTHROPLASTY Left 09/09/2018   Procedure: TOTAL KNEE ARTHROPLASTY, CORTISONE INJECTION RIGHT KNEE;  Surgeon: Paralee Cancel, MD;  Location: [facility] ORS;  Service: Orthopedics;  Laterality: Left;  70 mins  . TOTAL VAGINAL HYSTERECTOMY  10/18/2009   rectocele repair, sling  . TUBAL LIGATION Bilateral   . VARICOSE VEIN SURGERY    . VIDEO BRONCHOSCOPY WITH ENDOBRONCHIAL NAVIGATION N/A 10/20/2019   Procedure: VIDEO BRONCHOSCOPY WITH ENDOBRONCHIAL NAVIGATION;  Surgeon: Collene Gobble, MD;  Location: [facility] ENDOSCOPY;  Service: Pulmonary;  Laterality: N/A;    REVIEW OF SYSTEMS:   Review of Systems  Constitutional: Negative for appetite change, chills, fatigue, fever and unexpected weight change.  HENT: Negative for mouth sores, nosebleeds, sore throat and trouble swallowing.   Eyes: Negative for eye problems and icterus.   Respiratory: Positive for mild dyspnea on exertion with stairs and mild cough. Negative for hemoptysis, shortness of breath and wheezing.   Cardiovascular: Negative for chest pain and leg swelling.  Gastrointestinal: Negative for abdominal pain, constipation, diarrhea, nausea and vomiting.  Genitourinary: Negative for bladder incontinence, difficulty urinating, dysuria, frequency and hematuria.   Musculoskeletal: Positive for neck pain. Negative for back pain, gait problem, and neck stiffness.  Skin: Negative for itching and rash.  Neurological: Negative for dizziness, extremity weakness, gait problem, headaches, light-headedness and seizures.  Hematological: Negative for adenopathy. Does not bruise/bleed easily.  Psychiatric/Behavioral: Negative for confusion, depression and sleep disturbance. The patient is not nervous/anxious.     PHYSICAL EXAMINATION:  Blood pressure 132/75, pulse 71, temperature 98 F (36.7 C), temperature source Tympanic, resp. rate 17, height 5\' 5"  (1.651 m), weight 176 lb 14.4 oz (80.2 kg), last menstrual period 08/21/2002, SpO2  98 %.  ECOG PERFORMANCE STATUS: 1 - Symptomatic but completely ambulatory  Physical Exam  Constitutional: Oriented to person, place, and time and well-developed, well-nourished, and in no distress.  HENT:  Head: Normocephalic and atraumatic.  Mouth/Throat: Oropharynx is clear and moist. No oropharyngeal exudate.  Eyes: Conjunctivae are normal. Right eye exhibits no discharge. Left eye exhibits no discharge. No scleral icterus.  Neck: Normal range of motion. Neck supple.  Cardiovascular: Normal rate, regular rhythm, normal heart sounds and intact distal pulses.   Pulmonary/Chest: Effort normal and breath sounds normal. No respiratory distress. No wheezes. No rales.  Abdominal: Soft. Bowel sounds are normal. Exhibits no distension and no mass. There is no tenderness.  Musculoskeletal: Normal range of motion. Exhibits no edema.   Lymphadenopathy:    No cervical adenopathy.  Neurological: Alert and oriented to person, place, and time. Exhibits normal muscle tone. Gait normal. Coordination normal.  Skin: Skin is warm and dry. No rash noted. Not diaphoretic. No erythema. No pallor.  Psychiatric: Mood, memory and judgment normal.  Vitals reviewed.  LABORATORY DATA: Lab Results  Component Value Date   WBC 7.4 11/16/2019   HGB 13.6 11/16/2019   HCT 39.8 11/16/2019   MCV 91.7 11/16/2019   PLT 218 11/16/2019      Chemistry      Component Value Date/Time   NA 141 11/16/2019 0910   K 4.1 11/16/2019 0910   CL 108 11/16/2019 0910   CO2 26 11/16/2019 0910   BUN 10 11/16/2019 0910   CREATININE 0.68 11/16/2019 0910      Component Value Date/Time   CALCIUM 9.3 11/16/2019 0910   ALKPHOS 97 11/16/2019 0910   AST 16 11/16/2019 0910   ALT 14 11/16/2019 0910   BILITOT 0.4 11/16/2019 0910       RADIOGRAPHIC STUDIES:  X-ray chest PA or AP  Result Date: 10/20/2019 CLINICAL DATA:  Chest tube placement EXAM: CHEST  1 VIEW COMPARISON:  10/20/2019 FINDINGS: Interval placement of left-sided chest tube, pigtail projects over the left mediastinal silhouette. Decreased left pneumothorax with small residual at the apex. Left upper lobe lung mass. Small left-sided pleural effusion. Airspace disease at the left base. IMPRESSION: 1. Interval placement of left-sided chest tube with tip projecting over the upper left mediastinal silhouette, decreased left pneumothorax. Small residual left apical pneumothorax. 2. Left upper lobe lung mass. Small left effusion with left basilar atelectasis or infiltrate. Electronically Signed   By: Donavan Foil M.D.   On: 10/20/2019 18:40   DG Chest 2 View  Result Date: 10/28/2019 CLINICAL DATA:  History of pneumothorax. Left upper lobe lung lesion. EXAM: CHEST - 2 VIEW COMPARISON:  10/23/2019 FINDINGS: The left-sided chest tube is been removed. No pneumothorax is identified. Persistent ill-defined  left upper lobe density with surrounding interstitial changes. No pleural effusion. The bony thorax is intact. IMPRESSION: Removal of left-sided chest tube without pneumothorax. Persistent ill-defined left upper lobe opacity with surrounding interstitial changes. Electronically Signed   By: Marijo Sanes M.D.   On: 10/28/2019 16:56   MR Brain W Wo Contrast  Result Date: 11/13/2019 CLINICAL DATA:  Non-small cell lung cancer, staging. Follow-up of lesions described on prior MRI. EXAM: MRI HEAD WITHOUT AND WITH CONTRAST TECHNIQUE: Multiplanar, multiecho pulse sequences of the brain and surrounding structures were obtained without and with intravenous contrast. CONTRAST:  73mL MULTIHANCE GADOBENATE DIMEGLUMINE 529 MG/ML IV SOLN COMPARISON:  MRI of the brain November 06, 2019 FINDINGS: Brain: No acute infarction, hemorrhage, hydrocephalus, extra-axial collection or mass  lesion. Previously described enhancing focus in the inferior left cerebellar hemisphere is not demonstrated in the current exam. No focus of abnormal contrast enhancement is identified. Vascular: Normal flow voids. Developmental venous anomaly in the left frontal lobe. Skull and upper cervical spine: Normal marrow signal. Sinuses/Orbits: Negative. Other: Minimal left mastoid effusion. IMPRESSION: No evidence of intracranial metastatic disease. Previously described enhancing focus in the inferior left cerebellar hemisphere is not demonstrated in the current exam. Electronically Signed   By: Pedro Earls M.D.   On: 11/13/2019 15:37   MR BRAIN W WO CONTRAST  Result Date: 11/06/2019 CLINICAL DATA:  Non-small cell lung cancer, staging. EXAM: MRI HEAD WITHOUT AND WITH CONTRAST TECHNIQUE: Multiplanar, multiecho pulse sequences of the brain and surrounding structures were obtained without and with intravenous contrast. CONTRAST:  53mL MULTIHANCE GADOBENATE DIMEGLUMINE 529 MG/ML IV SOLN COMPARISON:  07/28/2017 MRI head.  05/04/2005 head  CT. FINDINGS: Brain: 4 mm enhancing left cerebellar focus (14:32). 5 mm enhancing inferior left cerebellar focus. Punctate scattered FLAIR hyperintense foci involving the supratentorial white matter is nonspecific. No acute infarct or intracranial hemorrhage. No midline shift, ventriculomegaly or extra-axial fluid collection. No mass lesion. Cerebral volume within normal limits. Vascular: Normal flow voids. Left frontal developmental venous anomaly. Skull and upper cervical spine: Normal marrow signal. Sinuses/Orbits: Normal orbits. Clear paranasal sinuses. Trace bilateral mastoid effusions. Other: None. IMPRESSION: Enhancing 4-5 mm left cerebellar foci may reflect metastases versus artifact given the amount of motion at this level. No enhancing intracranial lesions elsewhere. Electronically Signed   By: Primitivo Gauze M.D.   On: 11/06/2019 15:25   NM PET Image Initial (PI) Skull Base To Thigh  Result Date: 11/02/2019 CLINICAL DATA:  Initial treatment strategy for left upper lobe adenocarcinoma. EXAM: NUCLEAR MEDICINE PET SKULL BASE TO THIGH TECHNIQUE: 8.5 mCi F-18 FDG was injected intravenously. Full-ring PET imaging was performed from the skull base to thigh after the radiotracer. CT data was obtained and used for attenuation correction and anatomic localization. Fasting blood glucose: 103 mg/dl COMPARISON:  CT scan 10/14/2019 FINDINGS: Mediastinal blood pool activity: SUV max 2.8 Liver activity: SUV max NA NECK: Left level V lymph node measures 0.7 cm in short axis on image 30/4 (formerly 0.9 cm by my measurement) and has maximum SUV 2.8, similar to blood pool. Incidental CT findings: none CHEST: Somewhat irregular 6.6 by 3.6 cm left upper lobe mass image 26/8, maximum SUV 15.3, compatible with malignancy. Contralateral enlarged right hilar lymph node with maximum SUV 5.1, favoring malignant involvement. Left infrahilar lymph node maximum SUV 4.5. Subcarinal lymph nodes measuring up to 1.1 cm in diameter  with maximum SUV 4.4. Small left axillary lymph nodes including a 0.6 cm left axillary node on image 54 series 4 with maximum SUV 3.2. A lower thoracic para-aortic lymph node measuring 0.6 cm in short axis on image 93/4 has maximum SUV of 2.8, similar to blood pool. Incidental CT findings: Small left pleural effusion. Small amount of subcutaneous gas along the left breast likely related to prior chest tube. Mild atherosclerotic calcification of the aortic arch. Small type 1 hiatal hernia containing stomach and a small amount of fluid. Patchy postobstructive pneumonitis in the left upper lobe. ABDOMEN/PELVIS: Photopenic cyst in the right kidney upper pole. Clustered left para-aortic lymph nodes measuring up to 0.8 cm in short axis on image 118/4 with maximum SUV up to 3.2, nonspecific. Incidental CT findings: Mild hepatic steatosis. Mild prominence of the lateral segment left hepatic lobe. 1.2 by 1.0 cm  nodule of the right adrenal gland with nonspecific density but no obvious accentuated metabolic activity, probably merits surveillance. Sigmoid diverticulosis. SKELETON: No significant abnormal hypermetabolic activity in this region. Incidental CT findings: Lumbar spondylosis and degenerative disc disease. There findings of chronic avascular necrosis in the left femoral head, without flattening. IMPRESSION: 1. Hypermetabolic left upper lobe mass with maximum SUV 15.3. Postobstructive pneumonitis in the left upper lobe. Hypermetabolic adenopathy in the chest includes the contralateral right hilar lymph node; a left infrahilar node; and subcarinal nodes. 2. A small mass of the right adrenal gland is not appreciably hypermetabolic but given its small size, I would suggest surveillance. 3. A left level V lymph node in the neck measures only 0.7 cm in short axis and has a activity similar to blood. Probably not malignant but consider surveillance. 4. Other imaging findings of potential clinical significance: Small left  pleural effusion. Aortic Atherosclerosis (ICD10-I70.0). Small type 1 hiatal hernia. Mild hepatic steatosis. Sigmoid diverticulosis. 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 Electronically Signed   By: Van Clines M.D.   On: 11/02/2019 17:06   DG CHEST PORT 1 VIEW  Result Date: 10/23/2019 CLINICAL DATA:  Pneumothorax EXAM: PORTABLE CHEST 1 VIEW COMPARISON:  None. FINDINGS: The lungs are well expanded. Focal opacity within the left upper lobe is again visualized and is unchanged. Left upper lung zone pigtail chest tube is again seen is unchanged in position. There is no pneumothorax or pleural effusion. Subcutaneous gas within the left chest wall has improved in the interval. Cardiac size within normal limits. The pulmonary vascularity is normal. IMPRESSION: 1. Left chest tube in place.  No pneumothorax. 2. Unchanged focal opacity within the left upper lobe. Electronically Signed   By: Fidela Salisbury MD   On: 10/23/2019 17:39   DG Chest Port 1 View  Result Date: 10/23/2019 CLINICAL DATA:  Chest tube, pneumothorax, left lung mass EXAM: PORTABLE CHEST 1 VIEW COMPARISON:  10/22/2019 FINDINGS: No significant interval change in AP portable chest radiograph. There may be a very minimal, less than 5% left apical pneumothorax, not significantly changed compared to prior examination. Heterogeneous opacity of the left midlung. Left-sided pigtail chest tube remains in position. The heart and mediastinum are unremarkable. IMPRESSION: No significant interval change in AP portable chest radiograph. There may be a very minimal, less than 5% left apical pneumothorax, not significantly changed compared to prior examination. Left-sided chest tube remains in position. Electronically Signed   By: Eddie Candle M.D.   On: 10/23/2019 08:12   DG Chest Port 1 View  Result Date: 10/22/2019 CLINICAL DATA:  Pneumothorax.  Chest tube EXAM: PORTABLE CHEST 1 VIEW COMPARISON:  10/22/2019 FINDINGS:  Pigtail chest tube on the left is unchanged in position. Tiny left apical pneumothorax approximately 5 mm. Patchy airspace disease on the left unchanged. No effusion. Right lung remains clear. IMPRESSION: Tiny left apical pneumothorax. Patchy airspace disease left upper lobe unchanged, possible mass lesion based on CT. Electronically Signed   By: Franchot Gallo M.D.   On: 10/22/2019 16:18   DG Chest Port 1 View  Result Date: 10/22/2019 CLINICAL DATA:  LEFT-sided pneumothorax EXAM: PORTABLE CHEST 1 VIEW COMPARISON:  Multiple prior chest radiographs. Including study from 10/21/2019 is most recent prior FINDINGS: Slight rotation to the RIGHT on current exam. Accounting for this cardiomediastinal contours are stable. LEFT upper lobe mass as before. The tiny LEFT apical pneumothorax that was seen on the previous exam is not currently seen. There may be a  very small lateral component adjacent to the mass and fissural distortion in the lateral LEFT chest. On limited assessment skeletal structures without acute process. LEFT-sided chest tube likely retracted slightly since the previous exam based on appearance. IMPRESSION: LEFT-sided chest tube perhaps retracted slightly since previous imaging. Possible tiny pneumothorax remaining laterally adjacent to fissural distortion along the lateral margin of the known mass. Electronically Signed   By: Zetta Bills M.D.   On: 10/22/2019 08:32   DG Chest Port 1 View  Result Date: 10/21/2019 CLINICAL DATA:  Pneumothorax. EXAM: PORTABLE CHEST 1 VIEW COMPARISON:  10/20/2019 FINDINGS: 0629 hours. Hyperexpansion again noted. Tiny left apical pneumothorax is similar with left pleural drain in situ. The ill-defined left upper lobe lung lesion again noted. There is collapse/consolidation in the left base. The visualized bony structures of the thorax show no acute abnormality. Telemetry leads overlie the chest. IMPRESSION: 1. No substantial interval change in exam. Tiny left apical  pneumothorax with left pleural drain in situ. 2. Left base collapse/consolidation. Electronically Signed   By: Misty Stanley M.D.   On: 10/21/2019 06:40   DG Chest Port 1 View  Result Date: 10/20/2019 CLINICAL DATA:  Follow-up of pneumothorax EXAM: PORTABLE CHEST 1 VIEW COMPARISON:  Earlier today 1:45 p.m. FINDINGS: Midline trachea. Normal heart size. No mediastinal shift. No pleural fluid. Clear right lung. Enlargement of an approximately 40% left-sided pneumothorax. Underlying left-sided atelectasis and perihilar mass, suboptimally evaluated. IMPRESSION: Enlargement of an approximately 40% left-sided pneumothorax, without mediastinal shift to suggest tension. These results will be called to the ordering clinician or representative by the Radiologist Assistant, and communication documented in the PACS or [employer]. Electronically Signed   By: Abigail Miyamoto M.D.   On: 10/20/2019 17:46   DG Chest Port 1 View  Result Date: 10/20/2019 CLINICAL DATA:  Bronchoscopy and biopsy today. EXAM: PORTABLE CHEST 1 VIEW COMPARISON:  CT 10/14/2019 FINDINGS: Heart size is normal. Left pneumothorax estimated at 15-20%. Known mass in the left upper lobe. Interstitial prominence in the left lung. Prominence of the left hilum. Right chest is clear. IMPRESSION: Left pneumothorax estimated at 15-20%. Known mass in the left upper lobe. Interstitial prominence in the left lung. Prominence of the left hilum. Call report in progress. Electronically Signed   By: Nelson Chimes M.D.   On: 10/20/2019 14:20   DG C-ARM BRONCHOSCOPY  Result Date: 10/20/2019 C-ARM BRONCHOSCOPY: Fluoroscopy was utilized by the requesting physician.  No radiographic interpretation.     ASSESSMENT/PLAN:  This is a very pleasant 69 year old Caucasian female who is a never smoker who was recently diagnosed with at least stage IIIc (T3, n3, M0) non-small cell lung cancer, adenocarcinoma.  She presented with a large left upper lobe lung mass in  addition to left infrahilar and right hilar and subcarinal lymphadenopathy.  Brain MRI was negative for metastatic disease.  She was diagnosed in August 2021.  There is not enough material for foundation one testing.   The patient is currently undergoing concurrent chemoradiation with carboplatin for an AUC of 2 and paclitaxel 45 mg/m.  She is expected to receive her first dose of treatment today.  She is scheduled for her consultation with Dr. Sondra Come from radiation oncology on 11/19/2019.   The patient was seen with Dr. Julien Nordmann. Labs were reviewed.  Recommend that she proceed with cycle #1 today as scheduled.  We will see her back in 2 weeks for evaluation before starting cycle #3.  We will arrange for the patient to have molecular  studies performed by [facility] at her next lab draw next week.   The patient was advised to call immediately if she has any concerning symptoms in the interval. The patient voices understanding of current disease status and treatment options and is in agreement with the current care plan. All questions were answered. The patient knows to call the clinic with any problems, questions or concerns. We can certainly see the patient much sooner if necessary     Orders Placed This Encounter  Procedures  . [facility]    Standing Status:   Future    Standing Expiration Date:   11/15/2020     Tobe Sos Achol Azpeitia, PA-C 11/16/19  ADDENDUM: Hematology/Oncology Attending: I had a face-to-face encounter with the patient today.  I recommended her care plan.  This is a very pleasant 69 years old white female with stage IIIc (T3, N3, M0) non-small cell lung cancer, adenocarcinoma diagnosed in August 2021 and presented with large left upper lobe lung mass in addition to left infrahilar, right hilar and subcarinal lymphadenopathy. The patient is here today for evaluation before starting the first cycle of her treatment with concurrent chemoradiation with weekly  carboplatin for AUC of 2 and paclitaxel 45 mg/M2. She was found on recent MRI of the brain to have a suspicious focus in the brain concerning for metastasis.  Repeat MRI with SRS protocol was negative for metastatic disease. I recommended for the patient to proceed with the first cycle of her treatment today as planned. She will come back for follow-up visit in 2 weeks for evaluation and management of any adverse effect of her treatment. She was advised to call immediately if she has any concerning symptoms in the interval.  Disclaimer: This note was dictated with voice recognition software. Similar sounding words can inadvertently be transcribed and may be missed upon review. Eilleen Kempf, MD 11/16/19

## 2019-11-16 ENCOUNTER — Encounter: Payer: Self-pay | Admitting: Physician Assistant

## 2019-11-16 ENCOUNTER — Inpatient Hospital Stay: Payer: Medicare Other | Admitting: Physician Assistant

## 2019-11-16 ENCOUNTER — Other Ambulatory Visit: Payer: Self-pay

## 2019-11-16 ENCOUNTER — Inpatient Hospital Stay: Payer: Medicare Other

## 2019-11-16 VITALS — BP 127/59 | HR 69 | Temp 98.0°F | Resp 16

## 2019-11-16 VITALS — BP 132/75 | HR 71 | Temp 98.0°F | Resp 17 | Ht 65.0 in | Wt 176.9 lb

## 2019-11-16 DIAGNOSIS — Z5111 Encounter for antineoplastic chemotherapy: Secondary | ICD-10-CM | POA: Diagnosis not present

## 2019-11-16 DIAGNOSIS — C3492 Malignant neoplasm of unspecified part of left bronchus or lung: Secondary | ICD-10-CM | POA: Diagnosis not present

## 2019-11-16 LAB — CBC WITH DIFFERENTIAL (CANCER CENTER ONLY)
Abs Immature Granulocytes: 0.04 10*3/uL (ref 0.00–0.07)
Basophils Absolute: 0.1 10*3/uL (ref 0.0–0.1)
Basophils Relative: 1 %
Eosinophils Absolute: 0.3 10*3/uL (ref 0.0–0.5)
Eosinophils Relative: 4 %
HCT: 39.8 % (ref 36.0–46.0)
Hemoglobin: 13.6 g/dL (ref 12.0–15.0)
Immature Granulocytes: 1 %
Lymphocytes Relative: 19 %
Lymphs Abs: 1.4 10*3/uL (ref 0.7–4.0)
MCH: 31.3 pg (ref 26.0–34.0)
MCHC: 34.2 g/dL (ref 30.0–36.0)
MCV: 91.7 fL (ref 80.0–100.0)
Monocytes Absolute: 0.8 10*3/uL (ref 0.1–1.0)
Monocytes Relative: 10 %
Neutro Abs: 4.9 10*3/uL (ref 1.7–7.7)
Neutrophils Relative %: 65 %
Platelet Count: 218 10*3/uL (ref 150–400)
RBC: 4.34 MIL/uL (ref 3.87–5.11)
RDW: 12.1 % (ref 11.5–15.5)
WBC Count: 7.4 10*3/uL (ref 4.0–10.5)
nRBC: 0 % (ref 0.0–0.2)

## 2019-11-16 LAB — CMP (CANCER CENTER ONLY)
ALT: 14 U/L (ref 0–44)
AST: 16 U/L (ref 15–41)
Albumin: 3.8 g/dL (ref 3.5–5.0)
Alkaline Phosphatase: 97 U/L (ref 38–126)
Anion gap: 7 (ref 5–15)
BUN: 10 mg/dL (ref 8–23)
CO2: 26 mmol/L (ref 22–32)
Calcium: 9.3 mg/dL (ref 8.9–10.3)
Chloride: 108 mmol/L (ref 98–111)
Creatinine: 0.68 mg/dL (ref 0.44–1.00)
GFR, Est AFR Am: >60 mL/min (ref >60)
GFR, Estimated: >60 mL/min (ref >60)
Glucose, Bld: 105 mg/dL — ABNORMAL HIGH (ref 70–99)
Potassium: 4.1 mmol/L (ref 3.5–5.1)
Sodium: 141 mmol/L (ref 135–145)
Total Bilirubin: 0.4 mg/dL (ref 0.3–1.2)
Total Protein: 6.6 g/dL (ref 6.5–8.1)

## 2019-11-16 MED ORDER — SODIUM CHLORIDE 0.9 % IV SOLN
Freq: Once | INTRAVENOUS | Status: AC
  Filled 2019-11-16: qty 250

## 2019-11-16 MED ORDER — PALONOSETRON HCL INJECTION 0.25 MG/5ML
INTRAVENOUS | Status: AC
  Filled 2019-11-16: qty 5

## 2019-11-16 MED ORDER — DIPHENHYDRAMINE HCL 50 MG/ML IJ SOLN
50.0000 mg | Freq: Once | INTRAMUSCULAR | Status: AC
  Administered 2019-11-16: 50 mg via INTRAVENOUS

## 2019-11-16 MED ORDER — SODIUM CHLORIDE 0.9 % IV SOLN
20.0000 mg | Freq: Once | INTRAVENOUS | Status: AC
  Administered 2019-11-16: 20 mg via INTRAVENOUS
  Filled 2019-11-16: qty 20

## 2019-11-16 MED ORDER — SODIUM CHLORIDE 0.9 % IV SOLN
186.6000 mg | Freq: Once | INTRAVENOUS | Status: AC
  Administered 2019-11-16: 190 mg via INTRAVENOUS
  Filled 2019-11-16: qty 19

## 2019-11-16 MED ORDER — SODIUM CHLORIDE 0.9 % IV SOLN
45.0000 mg/m2 | Freq: Once | INTRAVENOUS | Status: AC
  Administered 2019-11-16: 84 mg via INTRAVENOUS
  Filled 2019-11-16: qty 14

## 2019-11-16 MED ORDER — FAMOTIDINE IN NACL 20-0.9 MG/50ML-% IV SOLN
20.0000 mg | Freq: Once | INTRAVENOUS | Status: AC
  Administered 2019-11-16: 20 mg via INTRAVENOUS

## 2019-11-16 MED ORDER — FAMOTIDINE IN NACL 20-0.9 MG/50ML-% IV SOLN
INTRAVENOUS | Status: AC
  Filled 2019-11-16: qty 50

## 2019-11-16 MED ORDER — DIPHENHYDRAMINE HCL 50 MG/ML IJ SOLN
INTRAMUSCULAR | Status: AC
  Filled 2019-11-16: qty 1

## 2019-11-16 MED ORDER — PALONOSETRON HCL INJECTION 0.25 MG/5ML
0.2500 mg | Freq: Once | INTRAVENOUS | Status: AC
  Administered 2019-11-16: 0.25 mg via INTRAVENOUS

## 2019-11-16 NOTE — Progress Notes (Signed)
Confirmed Pepcid dose of 40 mg is taken at home at bedtime.  Patient did not take a dose on 11/15/19.  Lavella Hammock, RN/Xyler Terpening Ronnald Ramp, PharmD

## 2019-11-16 NOTE — Patient Instructions (Signed)
Paclitaxel injection What is this medicine? PACLITAXEL (PAK li TAX el) is a chemotherapy drug. It targets fast dividing cells, like cancer cells, and causes these cells to die. This medicine is used to treat ovarian cancer, breast cancer, lung cancer, Kaposi's sarcoma, and other cancers. This medicine may be used for other purposes; ask your health care provider or pharmacist if you have questions. COMMON BRAND NAME(S): Onxol, Taxol What should I tell my health care provider before I take this medicine? They need to know if you have any of these conditions:  history of irregular heartbeat  liver disease  low blood counts, like low white cell, platelet, or red cell counts  lung or breathing disease, like asthma  tingling of the fingers or toes, or other nerve disorder  an unusual or allergic reaction to paclitaxel, alcohol, polyoxyethylated castor oil, other chemotherapy, other medicines, foods, dyes, or preservatives  pregnant or trying to get pregnant  breast-feeding How should I use this medicine? This drug is given as an infusion into a vein. It is administered in a hospital or clinic by a specially trained health care professional. Talk to your pediatrician regarding the use of this medicine in children. Special care may be needed. Overdosage: If you think you have taken too much of this medicine contact a poison control center or emergency room at once. NOTE: This medicine is only for you. Do not share this medicine with others. What if I miss a dose? It is important not to miss your dose. Call your doctor or health care professional if you are unable to keep an appointment. What may interact with this medicine? Do not take this medicine with any of the following medications:  disulfiram  metronidazole This medicine may also interact with the following medications:  antiviral medicines for hepatitis, HIV or AIDS  certain antibiotics like erythromycin and  clarithromycin  certain medicines for fungal infections like ketoconazole and itraconazole  certain medicines for seizures like carbamazepine, phenobarbital, phenytoin  gemfibrozil  nefazodone  rifampin  St. John's wort This list may not describe all possible interactions. Give your health care provider a list of all the medicines, herbs, non-prescription drugs, or dietary supplements you use. Also tell them if you smoke, drink alcohol, or use illegal drugs. Some items may interact with your medicine. What should I watch for while using this medicine? Your condition will be monitored carefully while you are receiving this medicine. You will need important blood work done while you are taking this medicine. This medicine can cause serious allergic reactions. To reduce your risk you will need to take other medicine(s) before treatment with this medicine. If you experience allergic reactions like skin rash, itching or hives, swelling of the face, lips, or tongue, tell your doctor or health care professional right away. In some cases, you may be given additional medicines to help with side effects. Follow all directions for their use. This drug may make you feel generally unwell. This is not uncommon, as chemotherapy can affect healthy cells as well as cancer cells. Report any side effects. Continue your course of treatment even though you feel ill unless your doctor tells you to stop. Call your doctor or health care professional for advice if you get a fever, chills or sore throat, or other symptoms of a cold or flu. Do not treat yourself. This drug decreases your body's ability to fight infections. Try to avoid being around people who are sick. This medicine may increase your risk to bruise  or bleed. Call your doctor or health care professional if you notice any unusual bleeding. Be careful brushing and flossing your teeth or using a toothpick because you may get an infection or bleed more easily.  If you have any dental work done, tell your dentist you are receiving this medicine. Avoid taking products that contain aspirin, acetaminophen, ibuprofen, naproxen, or ketoprofen unless instructed by your doctor. These medicines may hide a fever. Do not become pregnant while taking this medicine. Women should inform their doctor if they wish to become pregnant or think they might be pregnant. There is a potential for serious side effects to an unborn child. Talk to your health care professional or pharmacist for more information. Do not breast-feed an infant while taking this medicine. Men are advised not to father a child while receiving this medicine. This product may contain alcohol. Ask your pharmacist or healthcare provider if this medicine contains alcohol. Be sure to tell all healthcare providers you are taking this medicine. Certain medicines, like metronidazole and disulfiram, can cause an unpleasant reaction when taken with alcohol. The reaction includes flushing, headache, nausea, vomiting, sweating, and increased thirst. The reaction can last from 30 minutes to several hours. What side effects may I notice from receiving this medicine? Side effects that you should report to your doctor or health care professional as soon as possible:  allergic reactions like skin rash, itching or hives, swelling of the face, lips, or tongue  breathing problems  changes in vision  fast, irregular heartbeat  high or low blood pressure  mouth sores  pain, tingling, numbness in the hands or feet  signs of decreased platelets or bleeding - bruising, pinpoint red spots on the skin, black, tarry stools, blood in the urine  signs of decreased red blood cells - unusually weak or tired, feeling faint or lightheaded, falls  signs of infection - fever or chills, cough, sore throat, pain or difficulty passing urine  signs and symptoms of liver injury like dark yellow or brown urine; general ill feeling or  flu-like symptoms; light-colored stools; loss of appetite; nausea; right upper belly pain; unusually weak or tired; yellowing of the eyes or skin  swelling of the ankles, feet, hands  unusually slow heartbeat Side effects that usually do not require medical attention (report to your doctor or health care professional if they continue or are bothersome):  diarrhea  hair loss  loss of appetite  muscle or joint pain  nausea, vomiting  pain, redness, or irritation at site where injected  tiredness This list may not describe all possible side effects. Call your doctor for medical advice about side effects. You may report side effects to FDA at 1-800-FDA-1088. Where should I keep my medicine? This drug is given in a hospital or clinic and will not be stored at home. NOTE: This sheet is a summary. It may not cover all possible information. If you have questions about this medicine, talk to your doctor, pharmacist, or health care provider.  2020 [employer] Standard (2016-10-09 13:14:55)  Carboplatin injection What is this medicine? CARBOPLATIN (KAR boe pla tin) is a chemotherapy drug. It targets fast dividing cells, like cancer cells, and causes these cells to die. This medicine is used to treat ovarian cancer and many other cancers. This medicine may be used for other purposes; ask your health care provider or pharmacist if you have questions. COMMON BRAND NAME(S): Paraplatin What should I tell my health care provider before I take this medicine? They need  to know if you have any of these conditions:  blood disorders  hearing problems  kidney disease  recent or ongoing radiation therapy  an unusual or allergic reaction to carboplatin, cisplatin, other chemotherapy, other medicines, foods, dyes, or preservatives  pregnant or trying to get pregnant  breast-feeding How should I use this medicine? This drug is usually given as an infusion into a vein. It is administered in a  hospital or clinic by a specially trained health care professional. Talk to your pediatrician regarding the use of this medicine in children. Special care may be needed. Overdosage: If you think you have taken too much of this medicine contact a poison control center or emergency room at once. NOTE: This medicine is only for you. Do not share this medicine with others. What if I miss a dose? It is important not to miss a dose. Call your doctor or health care professional if you are unable to keep an appointment. What may interact with this medicine?  medicines for seizures  medicines to increase blood counts like filgrastim, pegfilgrastim, sargramostim  some antibiotics like amikacin, gentamicin, neomycin, streptomycin, tobramycin  vaccines Talk to your doctor or health care professional before taking any of these medicines:  acetaminophen  aspirin  ibuprofen  ketoprofen  naproxen This list may not describe all possible interactions. Give your health care provider a list of all the medicines, herbs, non-prescription drugs, or dietary supplements you use. Also tell them if you smoke, drink alcohol, or use illegal drugs. Some items may interact with your medicine. What should I watch for while using this medicine? Your condition will be monitored carefully while you are receiving this medicine. You will need important blood work done while you are taking this medicine. This drug may make you feel generally unwell. This is not uncommon, as chemotherapy can affect healthy cells as well as cancer cells. Report any side effects. Continue your course of treatment even though you feel ill unless your doctor tells you to stop. In some cases, you may be given additional medicines to help with side effects. Follow all directions for their use. Call your doctor or health care professional for advice if you get a fever, chills or sore throat, or other symptoms of a cold or flu. Do not treat  yourself. This drug decreases your body's ability to fight infections. Try to avoid being around people who are sick. This medicine may increase your risk to bruise or bleed. Call your doctor or health care professional if you notice any unusual bleeding. Be careful brushing and flossing your teeth or using a toothpick because you may get an infection or bleed more easily. If you have any dental work done, tell your dentist you are receiving this medicine. Avoid taking products that contain aspirin, acetaminophen, ibuprofen, naproxen, or ketoprofen unless instructed by your doctor. These medicines may hide a fever. Do not become pregnant while taking this medicine. Women should inform their doctor if they wish to become pregnant or think they might be pregnant. There is a potential for serious side effects to an unborn child. Talk to your health care professional or pharmacist for more information. Do not breast-feed an infant while taking this medicine. What side effects may I notice from receiving this medicine? Side effects that you should report to your doctor or health care professional as soon as possible:  allergic reactions like skin rash, itching or hives, swelling of the face, lips, or tongue  signs of infection - fever  or chills, cough, sore throat, pain or difficulty passing urine  signs of decreased platelets or bleeding - bruising, pinpoint red spots on the skin, black, tarry stools, nosebleeds  signs of decreased red blood cells - unusually weak or tired, fainting spells, lightheadedness  breathing problems  changes in hearing  changes in vision  chest pain  high blood pressure  low blood counts - This drug may decrease the number of white blood cells, red blood cells and platelets. You may be at increased risk for infections and bleeding.  nausea and vomiting  pain, swelling, redness or irritation at the injection site  pain, tingling, numbness in the hands or  feet  problems with balance, talking, walking  trouble passing urine or change in the amount of urine Side effects that usually do not require medical attention (report to your doctor or health care professional if they continue or are bothersome):  hair loss  loss of appetite  metallic taste in the mouth or changes in taste This list may not describe all possible side effects. Call your doctor for medical advice about side effects. You may report side effects to FDA at 1-800-FDA-1088. Where should I keep my medicine? This drug is given in a hospital or clinic and will not be stored at home. NOTE: This sheet is a summary. It may not cover all possible information. If you have questions about this medicine, talk to your doctor, pharmacist, or health care provider.  2020 [employer] Standard (2007-05-13 14:38:05)

## 2019-11-17 ENCOUNTER — Telehealth: Payer: Self-pay | Admitting: *Deleted

## 2019-11-18 ENCOUNTER — Encounter: Payer: Self-pay | Admitting: Emergency Medicine

## 2019-11-18 ENCOUNTER — Ambulatory Visit (INDEPENDENT_AMBULATORY_CARE_PROVIDER_SITE_OTHER): Payer: Medicare Other | Admitting: Emergency Medicine

## 2019-11-18 ENCOUNTER — Other Ambulatory Visit: Payer: Self-pay

## 2019-11-18 ENCOUNTER — Ambulatory Visit (INDEPENDENT_AMBULATORY_CARE_PROVIDER_SITE_OTHER): Payer: Medicare Other

## 2019-11-18 VITALS — BP 118/70 | HR 71 | Temp 97.6°F | Ht 65.5 in | Wt 177.0 lb

## 2019-11-18 DIAGNOSIS — J939 Pneumothorax, unspecified: Secondary | ICD-10-CM | POA: Diagnosis not present

## 2019-11-18 DIAGNOSIS — Z23 Encounter for immunization: Secondary | ICD-10-CM | POA: Diagnosis not present

## 2019-11-18 DIAGNOSIS — C3492 Malignant neoplasm of unspecified part of left bronchus or lung: Secondary | ICD-10-CM

## 2019-11-18 NOTE — Progress Notes (Signed)
Radiation Oncology         [phone]) [phone] ________________________________  Initial Outpatient Consultation  Name: Linda Woods MRN: [id number]  Date: 11/19/2019  DOB: [birth year]  [id number], Richardson Landry, MD  [name], MD   REFERRING PHYSICIAN: [name], MD  DIAGNOSIS: The encounter diagnosis was Adenocarcinoma of left lung, stage 3 ([place]).  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 female who is seen as a courtesy of Dr. Julien Nordmann for an opinion concerning radiation therapy as part of management for her recently diagnosed lung cancer. Today, she is accompanied by  her husband. The patient presented to [facility], F-NP, one month ago for evaluation of left-sided neck fullness with evolving pain in her left cheek. She underwent a soft tissue neck CT scan on 10/13/2019 that showed a partially visible abnormal left upper lung with a large, at least 8 cm, solid appearing mildly spiculated lung opacity that was suspicious for bronchogenic carcinoma. There was also noted to be a small layering left pleural effusion in addition to borderline to mildly enlarged left level IIIb lymph nodes in the lower neck that corresponded to the palpable abnormality and were suspicious for nodal metastases.  Chest CT scan was performed on 10/14/2019 showed a left upper lobe lung mass that was suspicious for primary bronchogenic carcinoma. There was also noted to be surrounding interstitial thickening that was suspicious for lymphangitic tumor spread. Finally, there were prominent, but not pathologically sized, thoracic nodes that were indeterminate, a small pleural effusion, and a right adrenal nodule that was technically indeterminate but stable and favorable to be benign when compared to prior CT scan. No findings of abdominopelvic metastatic disease.  Given the above findings, the patient was  referred to Dr. Lamonte Sakai and underwent a flexible video fiberoptic bronchoscopy with electromagnetic navigation and biopsies on 10/20/2019. Pathology from the procedure revealed benign left upper lobe lung tissue. Cytology, however, revealed malignant cells consistent with adenocarcinoma of the left upper lobe brushing as well as atypical cells in the left upper lobe fine needle aspiration. Of note, the patient did experience a post-op pneumothorax for which a chest tube was placed. The patient was stabilized and discharged home on 10/23/2019.  PET scan on 11/02/2019 showed a hypermetabolic left upper lobe mass with a maximum SUV of 15.3. It also showed postobstructive pneumonitis in the left upper lobe. Additionally, there was hypermetabolic adenopathy in the chest that included the contralateral right hilar lymph node, a left infrahilar node, and subcarinal nodes. There was also noted to be a small mass of the right adrenal gland that was not appreciably hypermetabolic. Finally, there was a left level V lymph node in the neck that measured only 0.7 cm in short axis and had activity similar to blood (probably not malignant but consider surveillance).  The patient was referred to medical oncology and was seen in consultation with Dr. Julien Nordmann on 11/03/2019. At that time, it was recommended that she complete staging work-up by undergoing a brain MRI. They also discussed a course of concurrent chemoradiation with weekly Carboplatin and Paclitaxel (started on 11/16/2019) with concurrent radiation for 6-7 weeks. That would then be followed by consolidation treatment with immunotherapy as the patient has no evidence for disease progression after the induction phase.  MRI of brain on 11/06/2019 showed an enhancing 4-5 mm left cerebellar foci that may have reflected metastases versus artifact given the amount of motion. There were  no enhancing intracranial lesions elsewhere. Brain MRI was repeated on 11/13/2019 and did  not show any evidence of intracranial metastatic disease.  PREVIOUS RADIATION THERAPY: No  PAST MEDICAL HISTORY:  Past Medical History:  Diagnosis Date  . Anemia    as a child  . Anxiety   . Bursitis   . Chronic reflux esophagitis   . Complication of anesthesia   . Diarrhea, functional   . Diverticulosis   . GERD (gastroesophageal reflux disease)   . History of kidney stones   . Hypertension   . Knee pain    right knee-seeing ortho  . Osteoarthritis   . Plantar fasciitis   . PONV (postoperative nausea and vomiting)    has used the patch before and that helps  . Pure hypercholesterolemia   . RLS (restless legs syndrome)   . Superficial vein thrombosis   . Thyroid disease    hypothyroid    PAST SURGICAL HISTORY: Past Surgical History:  Procedure Laterality Date  . APPENDECTOMY    . BRONCHIAL BIOPSY  10/20/2019   Procedure: BRONCHIAL BIOPSIES;  Surgeon: Collene Gobble, MD;  Location: [facility] ENDOSCOPY;  Service: Pulmonary;;  . BRONCHIAL BRUSHINGS  10/20/2019   Procedure: BRONCHIAL BRUSHINGS;  Surgeon: Collene Gobble, MD;  Location: [facility] ENDOSCOPY;  Service: Pulmonary;;  . BRONCHIAL NEEDLE ASPIRATION BIOPSY  10/20/2019   Procedure: BRONCHIAL NEEDLE ASPIRATION BIOPSIES;  Surgeon: Collene Gobble, MD;  Location: [facility];  Service: Pulmonary;;  . BRONCHIAL WASHINGS  10/20/2019   Procedure: BRONCHIAL WASHINGS;  Surgeon: Collene Gobble, MD;  Location: [facility] ENDOSCOPY;  Service: Pulmonary;;  . CARPAL TUNNEL RELEASE Right 2011   Dr. Amedeo Plenty  . COLPORRHAPHY     posterior  . HAND SURGERY Right 2016   Nerve surgery, Dr. Amedeo Plenty  . OVARIAN CYST REMOVAL    . RECTOCELE REPAIR  2011   w/TVH and sling  . TONSILLECTOMY    . TONSILLECTOMY    . TOTAL KNEE ARTHROPLASTY Left 09/09/2018   Procedure: TOTAL KNEE ARTHROPLASTY, CORTISONE INJECTION RIGHT KNEE;  Surgeon: Paralee Cancel, MD;  Location: [facility] ORS;  Service: Orthopedics;  Laterality: Left;  70 mins  . TOTAL VAGINAL HYSTERECTOMY  10/18/2009     rectocele repair, sling  . TUBAL LIGATION Bilateral   . VARICOSE VEIN SURGERY    . VIDEO BRONCHOSCOPY WITH ENDOBRONCHIAL NAVIGATION N/A 10/20/2019   Procedure: VIDEO BRONCHOSCOPY WITH ENDOBRONCHIAL NAVIGATION;  Surgeon: Collene Gobble, MD;  Location: [place] ENDOSCOPY;  Service: Pulmonary;  Laterality: N/A;    FAMILY HISTORY:  Family History  Problem Relation Age of Onset  . Cervical cancer Mother   . Heart failure Mother   . Heart failure Father   . Diabetes Father   . Cancer Brother   . Breast cancer Maternal Aunt   . Breast cancer Paternal Aunt     SOCIAL HISTORY:  Social History   Tobacco Use  . Smoking status: Never Smoker  . Smokeless tobacco: Never Used  Vaping Use  . Vaping Use: Never used  Substance Use Topics  . Alcohol use: Yes    Alcohol/week: 2.0 - 3.0 standard drinks    Types: 2 - 3 Glasses of wine per week  . Drug use: No    ALLERGIES:  Allergies  Allergen Reactions  . Crestor [Rosuvastatin Calcium] Diarrhea  . Statins     Joint pain    MEDICATIONS:  Current Outpatient Medications  Medication Sig Dispense Refill  . cholecalciferol (VITAMIN D3) 25 MCG (1000 UNIT) tablet  Take 1,000 Units by mouth daily. Pt states she take 2000 units/day    . diphenoxylate-atropine (LOMOTIL) 2.5-0.025 MG tablet Take 1 tablet by mouth daily as needed for diarrhea or loose stools.     . famotidine (PEPCID) 40 MG tablet Take 40 mg by mouth See admin instructions. Five times a week    . fexofenadine (ALLEGRA) 180 MG tablet Take 180 mg by mouth daily.    . fluticasone (FLONASE) 50 MCG/ACT nasal spray Place 2 sprays into both nostrils daily.    Marland Kitchen HYDROcodone-acetaminophen (NORCO/VICODIN) 5-325 MG tablet Take 0.5 tablets by mouth daily as needed (Knee pain).     Marland Kitchen HYDROcodone-homatropine (HYCODAN) 5-1.5 MG/5ML syrup Take 5 mLs by mouth daily as needed for cough.    Marland Kitchen ibuprofen (ADVIL) 200 MG tablet Take 400 mg by mouth every 6 (six) hours as needed (Knee pain).    . LORazepam  (ATIVAN) 0.5 MG tablet Take 0.5 mg by mouth at bedtime.     . melatonin 5 MG TABS Take 5 mg by mouth at bedtime.    . metoprolol succinate (TOPROL XL) 50 MG 24 hr tablet Take 50 mg by mouth daily.     Marland Kitchen oxymetazoline (AFRIN) 0.05 % nasal spray Place 1 spray into both nostrils daily as needed for congestion.    . prochlorperazine (COMPAZINE) 10 MG tablet Take 1 tablet (10 mg total) by mouth every 6 (six) hours as needed for nausea or vomiting. 30 tablet 0  . RABEprazole (ACIPHEX) 20 MG tablet Take 1 tablet (20 mg total) by mouth 2 (two) times a week.    Marland Kitchen SYNTHROID 112 MCG tablet Take 112 mcg by mouth daily before breakfast.      No current facility-administered medications for this encounter.    REVIEW OF SYSTEMS:  A 10+ POINT REVIEW OF SYSTEMS WAS OBTAINED including neurology, dermatology, psychiatry, cardiac, respiratory, lymph, extremities, GI, GU, musculoskeletal, constitutional, reproductive, HEENT.  She denies any headaches or visual problems.  She denies any pain or discomfort within the chest.  She denies any breathing issues or hemoptysis.   PHYSICAL EXAM:  height is 5' 5.5" (1.664 m) and weight is 175 lb 9.6 oz (79.7 kg). Her temperature is 97.8 F (36.6 C). Her blood pressure is 116/66 and her pulse is 73. Her respiration is 18 and oxygen saturation is 97%.   General: Alert and oriented, in no acute distress HEENT: Head is normocephalic. Extraocular movements are intact.  Neck: Neck is supple, no palpable cervical  Lymphadenopathy.  The patient has 2 small approximately 7- 8 mm lymph nodes in the lower left neck and upper supraclavicular region which are mildly tender with palpation Heart: Regular in rate and rhythm with no murmurs, rubs, or gallops. Chest: Clear to auscultation bilaterally, with no rhonchi, wheezes, or rales. Abdomen: Soft, nontender, nondistended, with no rigidity or guarding. Extremities: No cyanosis or edema. Lymphatics: see Neck Exam Skin: No concerning  lesions. Musculoskeletal: symmetric strength and muscle tone throughout. Neurologic: Cranial nerves II through XII are grossly intact. No obvious focalities. Speech is fluent. Coordination is intact. Psychiatric: Judgment and insight are intact. Affect is appropriate.   ECOG = 1  0 - Asymptomatic (Fully active, able to carry on all predisease activities without restriction)  1 - Symptomatic but completely ambulatory (Restricted in physically strenuous activity but ambulatory and able to carry out work of a light or sedentary nature. For example, light housework, office work)  2 - Symptomatic, <50% in bed during the day (Ambulatory  and capable of all self care but unable to carry out any work activities. Up and about more than 50% of waking hours)  3 - Symptomatic, >50% in bed, but not bedbound (Capable of only limited self-care, confined to bed or chair 50% or more of waking hours)  4 - Bedbound (Completely disabled. Cannot carry on any self-care. Totally confined to bed or chair)  5 - Death   Eustace Pen MM, Creech RH, Tormey DC, et al. [phone]). "Toxicity and response criteria of the [facility]". [place] Oncol. 5 (6): 649-55  LABORATORY DATA:  Lab Results  Component Value Date   WBC 7.4 11/16/2019   HGB 13.6 11/16/2019   HCT 39.8 11/16/2019   MCV 91.7 11/16/2019   PLT 218 11/16/2019   NEUTROABS 4.9 11/16/2019   Lab Results  Component Value Date   NA 141 11/16/2019   K 4.1 11/16/2019   CL 108 11/16/2019   CO2 26 11/16/2019   GLUCOSE 105 (H) 11/16/2019   CREATININE 0.68 11/16/2019   CALCIUM 9.3 11/16/2019      RADIOGRAPHY: DG Chest 2 View  Result Date: 10/28/2019 CLINICAL DATA:  History of pneumothorax. Left upper lobe lung lesion. EXAM: CHEST - 2 VIEW COMPARISON:  10/23/2019 FINDINGS: The left-sided chest tube is been removed. No pneumothorax is identified. Persistent ill-defined left upper lobe density with surrounding interstitial changes. No  pleural effusion. The bony thorax is intact. IMPRESSION: Removal of left-sided chest tube without pneumothorax. Persistent ill-defined left upper lobe opacity with surrounding interstitial changes. Electronically Signed   By: Marijo Sanes M.D.   On: 10/28/2019 16:56   MR Brain W Wo Contrast  Result Date: 11/13/2019 CLINICAL DATA:  Non-small cell lung cancer, staging. Follow-up of lesions described on prior MRI. EXAM: MRI HEAD WITHOUT AND WITH CONTRAST TECHNIQUE: Multiplanar, multiecho pulse sequences of the brain and surrounding structures were obtained without and with intravenous contrast. CONTRAST:  54mL MULTIHANCE GADOBENATE DIMEGLUMINE 529 MG/ML IV SOLN COMPARISON:  MRI of the brain November 06, 2019 FINDINGS: Brain: No acute infarction, hemorrhage, hydrocephalus, extra-axial collection or mass lesion. Previously described enhancing focus in the inferior left cerebellar hemisphere is not demonstrated in the current exam. No focus of abnormal contrast enhancement is identified. Vascular: Normal flow voids. Developmental venous anomaly in the left frontal lobe. Skull and upper cervical spine: Normal marrow signal. Sinuses/Orbits: Negative. Other: Minimal left mastoid effusion. IMPRESSION: No evidence of intracranial metastatic disease. Previously described enhancing focus in the inferior left cerebellar hemisphere is not demonstrated in the current exam. Electronically Signed   By: Pedro Earls M.D.   On: 11/13/2019 15:37   MR BRAIN W WO CONTRAST  Result Date: 11/06/2019 CLINICAL DATA:  Non-small cell lung cancer, staging. EXAM: MRI HEAD WITHOUT AND WITH CONTRAST TECHNIQUE: Multiplanar, multiecho pulse sequences of the brain and surrounding structures were obtained without and with intravenous contrast. CONTRAST:  17mL MULTIHANCE GADOBENATE DIMEGLUMINE 529 MG/ML IV SOLN COMPARISON:  07/28/2017 MRI head.  05/04/2005 head CT. FINDINGS: Brain: 4 mm enhancing left cerebellar focus (14:32).  5 mm enhancing inferior left cerebellar focus. Punctate scattered FLAIR hyperintense foci involving the supratentorial white matter is nonspecific. No acute infarct or intracranial hemorrhage. No midline shift, ventriculomegaly or extra-axial fluid collection. No mass lesion. Cerebral volume within normal limits. Vascular: Normal flow voids. Left frontal developmental venous anomaly. Skull and upper cervical spine: Normal marrow signal. Sinuses/Orbits: Normal orbits. Clear paranasal sinuses. Trace bilateral mastoid effusions. Other: None. IMPRESSION: Enhancing 4-5 mm left  cerebellar foci may reflect metastases versus artifact given the amount of motion at this level. No enhancing intracranial lesions elsewhere. Electronically Signed   By: Primitivo Gauze M.D.   On: 11/06/2019 15:25   NM PET Image Initial (PI) Skull Base To Thigh  Result Date: 11/02/2019 CLINICAL DATA:  Initial treatment strategy for left upper lobe adenocarcinoma. EXAM: NUCLEAR MEDICINE PET SKULL BASE TO THIGH TECHNIQUE: 8.5 mCi F-18 FDG was injected intravenously. Full-ring PET imaging was performed from the skull base to thigh after the radiotracer. CT data was obtained and used for attenuation correction and anatomic localization. Fasting blood glucose: 103 mg/dl COMPARISON:  CT scan 10/14/2019 FINDINGS: Mediastinal blood pool activity: SUV max 2.8 Liver activity: SUV max NA NECK: Left level V lymph node measures 0.7 cm in short axis on image 30/4 (formerly 0.9 cm by my measurement) and has maximum SUV 2.8, similar to blood pool. Incidental CT findings: none CHEST: Somewhat irregular 6.6 by 3.6 cm left upper lobe mass image 26/8, maximum SUV 15.3, compatible with malignancy. Contralateral enlarged right hilar lymph node with maximum SUV 5.1, favoring malignant involvement. Left infrahilar lymph node maximum SUV 4.5. Subcarinal lymph nodes measuring up to 1.1 cm in diameter with maximum SUV 4.4. Small left axillary lymph nodes including a  0.6 cm left axillary node on image 54 series 4 with maximum SUV 3.2. A lower thoracic para-aortic lymph node measuring 0.6 cm in short axis on image 93/4 has maximum SUV of 2.8, similar to blood pool. Incidental CT findings: Small left pleural effusion. Small amount of subcutaneous gas along the left breast likely related to prior chest tube. Mild atherosclerotic calcification of the aortic arch. Small type 1 hiatal hernia containing stomach and a small amount of fluid. Patchy postobstructive pneumonitis in the left upper lobe. ABDOMEN/PELVIS: Photopenic cyst in the right kidney upper pole. Clustered left para-aortic lymph nodes measuring up to 0.8 cm in short axis on image 118/4 with maximum SUV up to 3.2, nonspecific. Incidental CT findings: Mild hepatic steatosis. Mild prominence of the lateral segment left hepatic lobe. 1.2 by 1.0 cm nodule of the right adrenal gland with nonspecific density but no obvious accentuated metabolic activity, probably merits surveillance. Sigmoid diverticulosis. SKELETON: No significant abnormal hypermetabolic activity in this region. Incidental CT findings: Lumbar spondylosis and degenerative disc disease. There findings of chronic avascular necrosis in the left femoral head, without flattening. IMPRESSION: 1. Hypermetabolic left upper lobe mass with maximum SUV 15.3. Postobstructive pneumonitis in the left upper lobe. Hypermetabolic adenopathy in the chest includes the contralateral right hilar lymph node; a left infrahilar node; and subcarinal nodes. 2. A small mass of the right adrenal gland is not appreciably hypermetabolic but given its small size, I would suggest surveillance. 3. A left level V lymph node in the neck measures only 0.7 cm in short axis and has a activity similar to blood. Probably not malignant but consider surveillance. 4. Other imaging findings of potential clinical significance: Small left pleural effusion. Aortic Atherosclerosis (ICD10-I70.0). Small type 1  hiatal hernia. Mild hepatic steatosis. Sigmoid diverticulosis. 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 Electronically Signed   By: Van Clines M.D.   On: 11/02/2019 17:06   DG CHEST PORT 1 VIEW  Result Date: 10/23/2019 CLINICAL DATA:  Pneumothorax EXAM: PORTABLE CHEST 1 VIEW COMPARISON:  None. FINDINGS: The lungs are well expanded. Focal opacity within the left upper lobe is again visualized and is unchanged. Left upper lung zone pigtail chest tube is again  seen is unchanged in position. There is no pneumothorax or pleural effusion. Subcutaneous gas within the left chest wall has improved in the interval. Cardiac size within normal limits. The pulmonary vascularity is normal. IMPRESSION: 1. Left chest tube in place.  No pneumothorax. 2. Unchanged focal opacity within the left upper lobe. Electronically Signed   By: Fidela Salisbury MD   On: 10/23/2019 17:39   DG Chest Port 1 View  Result Date: 10/23/2019 CLINICAL DATA:  Chest tube, pneumothorax, left lung mass EXAM: PORTABLE CHEST 1 VIEW COMPARISON:  10/22/2019 FINDINGS: No significant interval change in AP portable chest radiograph. There may be a very minimal, less than 5% left apical pneumothorax, not significantly changed compared to prior examination. Heterogeneous opacity of the left midlung. Left-sided pigtail chest tube remains in position. The heart and mediastinum are unremarkable. IMPRESSION: No significant interval change in AP portable chest radiograph. There may be a very minimal, less than 5% left apical pneumothorax, not significantly changed compared to prior examination. Left-sided chest tube remains in position. Electronically Signed   By: Eddie Candle M.D.   On: 10/23/2019 08:12   DG Chest Port 1 View  Result Date: 10/22/2019 CLINICAL DATA:  Pneumothorax.  Chest tube EXAM: PORTABLE CHEST 1 VIEW COMPARISON:  10/22/2019 FINDINGS: Pigtail chest tube on the left is unchanged in position. Tiny left apical  pneumothorax approximately 5 mm. Patchy airspace disease on the left unchanged. No effusion. Right lung remains clear. IMPRESSION: Tiny left apical pneumothorax. Patchy airspace disease left upper lobe unchanged, possible mass lesion based on CT. Electronically Signed   By: Franchot Gallo M.D.   On: 10/22/2019 16:18   DG Chest Port 1 View  Result Date: 10/22/2019 CLINICAL DATA:  LEFT-sided pneumothorax EXAM: PORTABLE CHEST 1 VIEW COMPARISON:  Multiple prior chest radiographs. Including study from 10/21/2019 is most recent prior FINDINGS: Slight rotation to the RIGHT on current exam. Accounting for this cardiomediastinal contours are stable. LEFT upper lobe mass as before. The tiny LEFT apical pneumothorax that was seen on the previous exam is not currently seen. There may be a very small lateral component adjacent to the mass and fissural distortion in the lateral LEFT chest. On limited assessment skeletal structures without acute process. LEFT-sided chest tube likely retracted slightly since the previous exam based on appearance. IMPRESSION: LEFT-sided chest tube perhaps retracted slightly since previous imaging. Possible tiny pneumothorax remaining laterally adjacent to fissural distortion along the lateral margin of the known mass. Electronically Signed   By: Zetta Bills M.D.   On: 10/22/2019 08:32   DG Chest Port 1 View  Result Date: 10/21/2019 CLINICAL DATA:  Pneumothorax. EXAM: PORTABLE CHEST 1 VIEW COMPARISON:  10/20/2019 FINDINGS: 0629 hours. Hyperexpansion again noted. Tiny left apical pneumothorax is similar with left pleural drain in situ. The ill-defined left upper lobe lung lesion again noted. There is collapse/consolidation in the left base. The visualized bony structures of the thorax show no acute abnormality. Telemetry leads overlie the chest. IMPRESSION: 1. No substantial interval change in exam. Tiny left apical pneumothorax with left pleural drain in situ. 2. Left base  collapse/consolidation. Electronically Signed   By: Misty Stanley M.D.   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  Patient is a never smoker and should have good lung function at this point. Plan  to Incorporate PET positive disease into her radiation fields.  This may be somewhat technically possible given the contralateral right hilar lesion but may be achievable.  In addition we will attempt to cover the 2 small lymph nodes in the left supraclavicular region.  On PET scan these showed borderline activity.  Today, I talked to the patient and husband about the findings and work-up thus far.  We discussed the natural history of adenocarcinoma and general treatment, highlighting the role of radiotherapy in the management.  We discussed the available radiation techniques, and focused on the details of logistics and delivery.  We reviewed the anticipated acute and late sequelae associated with radiation in this setting.  The patient was encouraged to ask questions that I answered to the best of my ability.  A patient consent form was discussed and signed.  We retained a copy for our records.  The patient would like to proceed with radiation and will be scheduled for CT simulation.  PLAN: Patient will return for CT simulation on Monday, October 4 with treatments to begin October 7 concomitant with radiosensitizing chemotherapy.  Anticipate 6 weeks of radiation therapy.  After induction therapy the patient will then be evaluated for immunotherapy or possibly targeted therapy depending on the results of additional evaluation.  Total time spent in this encounter was 65 minutes which included reviewing the patient's most recent neck CT scan, chest CT scan, bronchoscopy, pathology and cytology reports, PET scan,  brain MRIs, consultations, follow-ups, physical examination, and documentation.  ------------------------------------------------  Blair Promise, PhD, MD  This document serves as a record of services personally performed by Gery Pray, MD. It was created on his behalf by Clerance Lav, a trained medical scribe. The creation of this record is based on the scribe's personal observations and the provider's statements to them. This document has been checked and approved by the attending provider.

## 2019-11-18 NOTE — Assessment & Plan Note (Signed)
Resolved by chest x-ray at hospital discharge and again on 9/8.  I will check 1 more film today to ensure stability, call her if abnormal.

## 2019-11-18 NOTE — Patient Instructions (Signed)
Please follow with Dr. Sondra Come and Julien Nordmann as planned Chest x-ray today Follow with Dr. Lamonte Sakai if needed for any change in your breathing or any other respiratory symptoms.

## 2019-11-18 NOTE — Progress Notes (Signed)
Patient here for a consult with Dr. Sondra Come.  Curt Bears, MD  Physician  Oncology  Progress Notes    Signed  Encounter Date:  11/03/2019          Signed      Expand All Collapse All     [facility] [phone]   [phone]  CONSULT NOTE  REFERRING PHYSICIAN: Dr. Baltazar Apo  REASON FOR CONSULTATION:  69 years old white female recently diagnosed with lung cancer.  HPI Linda Woods is a 69 y.o. female with past medical history significant for hypertension, dyslipidemia, hypothyroidism, GERD, and anxiety, anemia, kidney stone, restless leg syndrome as well as diverticulosis.  The patient is a never smoker.  She mentions that few months ago she felt a lump in the left side of the neck.  She was seen by her primary care physician and given prescription for antibiotics with no improvement.  She had CT scan of the neck on 10/13/2019 and it showed partially visible abnormal left upper lobe lung with large at least 8 cm solid-appearing mildly spiculated lung opacity suspicious for bronchogenic carcinoma.  There was a small layering of left pleural effusion and borderline to mildly enlarged left level IIIB lymph nodes in the lower neck corresponding to the palpable abnormality.  These were nonspecific.  On 10/14/2019 the patient had CT scan of the chest, abdomen pelvis with contrast and it showed left upper lobe lung mass measuring 6.9 x 3.7 x 2.8 cm.  There was also 1.3 cm right hilar node and small prevascular nodes are not pathologic by size criteria.  There was also upper normal-sized periaortic node measuring 0.7 cm.  The scan also showed a small left pleural effusion and 0.3 cm left lower lobe pulmonary nodule.  There was also a right adrenal 1.0 x 1.3 cm nodule.  The patient was referred to Dr. Lamonte Sakai and on October 20, 2019 she underwent video bronchoscopy with electromagnetic navigation procedure.  The final pathology ([id number])  malignant cells consistent with adenocarcinoma from the left upper lobe brushing.  The patient also had a PET scan on 11/02/2019 and it showed hypermetabolic left upper lobe lung mass with maximum SUV of 15.3 with postobstructive pneumonitis in the left upper lobe.  There was hypermetabolic adenopathy in the chest including the contralateral right hilar lymph node and a left infrahilar node as well as subcarinal nodes.  There was a small mass of the right adrenal gland not appreciably hypermetabolic.  There was also a left level 4 lymph node in the neck measuring 0.7 cm in short axis and has activity similar to the blood, probably not malignant but require surveillance.  The patient also has a small left pleural effusion. Dr. Lamonte Sakai kindly referred the patient to me today for evaluation and recommendation regarding treatment of her condition. When seen today she continues to have pain on the left jaw area as well as left neck.  She has an anxiety.  She denied having any chest pain, shortness of breath, cough or hemoptysis.  She denied having any significant weight loss.  She has no nausea, vomiting but has chronic diarrhea secondary to diverticulosis.  The patient denied having any headache or visual changes. Family history significant for mother with cervical cancer and died at age 10.  Father had congestive heart failure and died at age 34 she also has a maternal uncle with bone cancer. The patient is married and has 3 children 2 daughters and 1 son.  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  The patient is a never smoker but drinks wine at nighttime and no history of drug abuse.  HPI      Past Medical History:  Diagnosis Date  . Anemia    as a child  . Anxiety   . Bursitis   . Chronic reflux esophagitis   . Complication of anesthesia   . Diarrhea, functional   . Diverticulosis   . GERD (gastroesophageal reflux disease)   . History of kidney  stones   . Hypertension   . Knee pain    right knee-seeing ortho  . Osteoarthritis   . Plantar fasciitis   . PONV (postoperative nausea and vomiting)    has used the patch before and that helps  . Pure hypercholesterolemia   . RLS (restless legs syndrome)   . Superficial vein thrombosis   . Thyroid disease    hypothyroid         Past Surgical History:  Procedure Laterality Date  . APPENDECTOMY    . BRONCHIAL BIOPSY  10/20/2019   Procedure: BRONCHIAL BIOPSIES;  Surgeon: Collene Gobble, MD;  Location: [facility] ENDOSCOPY;  Service: Pulmonary;;  . BRONCHIAL BRUSHINGS  10/20/2019   Procedure: BRONCHIAL BRUSHINGS;  Surgeon: Collene Gobble, MD;  Location: [facility] ENDOSCOPY;  Service: Pulmonary;;  . BRONCHIAL NEEDLE ASPIRATION BIOPSY  10/20/2019   Procedure: BRONCHIAL NEEDLE ASPIRATION BIOPSIES;  Surgeon: Collene Gobble, MD;  Location: [place];  Service: Pulmonary;;  . BRONCHIAL WASHINGS  10/20/2019   Procedure: BRONCHIAL WASHINGS;  Surgeon: Collene Gobble, MD;  [facility] END[facility];  Service: Pulmonary;;  . CARPAL TUNNEL RELEASE Right 2011   Dr. Amedeo Plenty  . COLPORRHAPHY     posterior  . HAND SURGERY Right 2016   Nerve surgery, Dr. Amedeo Plenty  . OVARIAN CYST REMOVAL    . RECTOCELE REPAIR  2011                                                             Past/Anticipated interventions by medical oncology, if any: chemo  Signs/Symptoms  Weight changes, if any: no  Respiratory complaints, if any: Hemoptysis, if any: no  Pain issues, if any:  5  h/a  SAFETY ISSUES:  Prior radiation? no  Pacemaker/ICD? no  Possible current pregnancy? Hysterectomy  Is the patient on methotrexate? no  Current Complaints / other details: accompanied by her husband  BP 116/66 (BP Location: Left Arm, Patient Position: Sitting, Cuff Size: Normal)   Pulse 73   Temp 97.8 F (36.6 C)   Resp 18   Ht 5' 5.5" (1.664 m)   Wt 175 lb 9.6 oz (79.7 kg)    LMP 08/21/2002   SpO2 97%   BMI 28.78 kg/m   Wt Readings from Last 3 Encounters:  11/19/19 175 lb 9.6 oz (79.7 kg)  11/18/19 177 lb (80.3 kg)  11/16/19 176 lb 14.4 oz (80.2 kg)

## 2019-11-18 NOTE — Progress Notes (Signed)
   Subjective:    Patient ID: Linda Woods, female    DOB: Apr 15, 1950, 69 y.o.   MRN: [id number]  HPI 69 year old never smoker with a history of GERD, hypertension, hypercholesterolemia, hypothyroidism, rest leg syndrome.  She had COVID-19 in November 2020, received Ab infusion. Vaccinated in March / April.  She is referred today for evaluation of an abnormal CT chest.  She developed some left neck fullness over the last several months (after the COVID vaccine) with evolving pain in her left cheek that prompted a CT scan of her neck on 10/13/2019 which I reviewed, showed an asymmetry in the supraclavicular lymph nodes on the left with some stranding.  Limited visualization of the upper chest showed a partially visible confluent 8 cm solid-appearing spiculated opacity in the left upper lobe.  This prompted CT scan of the chest abdomen and pelvis on 10/14/2019 which I reviewed, confirmed an irregularly shaped 6.9 x 3.7 cm left upper lobe opacity, small left pleural effusion, 1.3 cm right hilar node and a 7 mm periaortic node  Minimal sx, some L chest and throat discomfort in the am. Some thick mucous, mainly seasonal. Never hemoptysis.   ROV 11/18/19 --69 year old woman who follows up today for abnormal CT scan of the neck and chest as above.  She underwent navigational bronchoscopy on 10/20/2019, diagnosed with adenocarcinoma and in the left upper lobe.  Course complicated by pneumothorax requiring chest tube. PET scan performed on 11/02/2019 reviewed by me, shows hypermetabolic left upper lobe mass, small right adrenal mass that was not hypermetabolic, left level 5 lymph node not hypermetabolic.  MRI brain done on 11/13/2019 showed no evidence intracranial metastatic disease.  She has seen Dr. Julien Nordmann and is currently getting chemoradiation with Dr Sondra Come. Planning for 6-7 weeks chemo.    Review of Systems As per HPI     Objective:   Physical Exam Vitals:   11/18/19 1401  BP: 118/70  Pulse:  71  Temp: 97.6 F (36.4 C)  TempSrc: Temporal  SpO2: 98%  Weight: 177 lb (80.3 kg)  Height: 5' 5.5" (1.664 m)   Gen: Pleasant, well-nourished, in no distress,  normal affect  ENT: No lesions,  mouth clear,  oropharynx clear, no postnasal drip.   Neck: No JVD, no stridor  Lungs: No use of accessory muscles, no crackles or wheezing on normal respiration, no wheeze on forced expiration.  Healing left upper chest pigtail insertion site, no erythema or purulence  Cardiovascular: RRR, heart sounds normal, no murmur or gallops, no peripheral edema  Musculoskeletal: No deformities, no cyanosis or clubbing.    Neuro: alert, awake, non focal  Skin: Warm, no lesions or rash     Assessment & Plan:  Pneumothorax, left Resolved by chest x-ray at hospital discharge and again on 9/8.  I will check 1 more film today to ensure stability, call her if abnormal.  Adenocarcinoma of left lung, stage 3 ([place]) Following with Dr. Julien Nordmann and Dr. Sondra Come and plan for chemoradiation, possible targeted therapy if she qualifies.  She can follow-up with me if there is any evidence of recurrence or any need for repeat biopsy at any point.  Baltazar Apo, MD, PhD 11/18/2019, 2:33 PM [facility] [phone] or if no answer [phone]

## 2019-11-18 NOTE — Assessment & Plan Note (Signed)
Following with Dr. Julien Nordmann and Dr. Sondra Come and plan for chemoradiation, possible targeted therapy if she qualifies.  She can follow-up with me if there is any evidence of recurrence or any need for repeat biopsy at any point.

## 2019-11-18 NOTE — Addendum Note (Signed)
Addended by: Gavin Potters R on: 11/18/2019 02:51 PM   Modules accepted: Orders

## 2019-11-19 ENCOUNTER — Other Ambulatory Visit: Payer: Self-pay

## 2019-11-19 ENCOUNTER — Encounter: Payer: Self-pay | Admitting: Radiation Oncology

## 2019-11-19 ENCOUNTER — Ambulatory Visit
Admission: RE | Admit: 2019-11-19 | Discharge: 2019-11-19 | Disposition: A | Discharge status: Home / Self Care | Payer: Medicare Other | Source: Ambulatory Visit | Attending: Radiation Oncology | Admitting: Radiation Oncology

## 2019-11-19 ENCOUNTER — Encounter: Payer: Self-pay | Admitting: Internal Medicine

## 2019-11-19 DIAGNOSIS — F419 Anxiety disorder, unspecified: Secondary | ICD-10-CM | POA: Insufficient documentation

## 2019-11-19 DIAGNOSIS — E7800 Pure hypercholesterolemia, unspecified: Secondary | ICD-10-CM | POA: Insufficient documentation

## 2019-11-19 DIAGNOSIS — K219 Gastro-esophageal reflux disease without esophagitis: Secondary | ICD-10-CM | POA: Diagnosis not present

## 2019-11-19 DIAGNOSIS — C3492 Malignant neoplasm of unspecified part of left bronchus or lung: Secondary | ICD-10-CM

## 2019-11-19 DIAGNOSIS — I10 Essential (primary) hypertension: Secondary | ICD-10-CM | POA: Diagnosis not present

## 2019-11-19 DIAGNOSIS — K5730 Diverticulosis of large intestine without perforation or abscess without bleeding: Secondary | ICD-10-CM | POA: Diagnosis not present

## 2019-11-19 DIAGNOSIS — K449 Diaphragmatic hernia without obstruction or gangrene: Secondary | ICD-10-CM | POA: Diagnosis not present

## 2019-11-19 DIAGNOSIS — J90 Pleural effusion, not elsewhere classified: Secondary | ICD-10-CM | POA: Diagnosis not present

## 2019-11-19 DIAGNOSIS — Z79899 Other long term (current) drug therapy: Secondary | ICD-10-CM | POA: Insufficient documentation

## 2019-11-19 DIAGNOSIS — E039 Hypothyroidism, unspecified: Secondary | ICD-10-CM | POA: Insufficient documentation

## 2019-11-19 DIAGNOSIS — Z803 Family history of malignant neoplasm of breast: Secondary | ICD-10-CM | POA: Diagnosis not present

## 2019-11-19 DIAGNOSIS — C3412 Malignant neoplasm of upper lobe, left bronchus or lung: Secondary | ICD-10-CM | POA: Insufficient documentation

## 2019-11-19 DIAGNOSIS — R590 Localized enlarged lymph nodes: Secondary | ICD-10-CM | POA: Insufficient documentation

## 2019-11-19 DIAGNOSIS — Z86718 Personal history of other venous thrombosis and embolism: Secondary | ICD-10-CM | POA: Diagnosis not present

## 2019-11-19 DIAGNOSIS — G2581 Restless legs syndrome: Secondary | ICD-10-CM | POA: Diagnosis not present

## 2019-11-19 DIAGNOSIS — K760 Fatty (change of) liver, not elsewhere classified: Secondary | ICD-10-CM | POA: Insufficient documentation

## 2019-11-19 DIAGNOSIS — D649 Anemia, unspecified: Secondary | ICD-10-CM | POA: Insufficient documentation

## 2019-11-19 DIAGNOSIS — M1990 Unspecified osteoarthritis, unspecified site: Secondary | ICD-10-CM | POA: Diagnosis not present

## 2019-11-19 DIAGNOSIS — Z87442 Personal history of urinary calculi: Secondary | ICD-10-CM | POA: Insufficient documentation

## 2019-11-20 ENCOUNTER — Encounter: Payer: Self-pay | Admitting: General Practice

## 2019-11-20 NOTE — Progress Notes (Signed)
[facility] Psychosocial Distress Screening Clinical Social Work  Clinical Social Work was referred by distress screening protocol.  The patient scored a 6 on the Psychosocial Distress Thermometer which indicates moderate distress. Clinical Social Worker did not contact patient as she declined social work referral to assess for distress and other psychosocial needs.   ON[facility] DISTRESS SCREENING 11/19/2019  Screening Type Initial Screening  Distress experienced in past week (1-10) 6  Referral to clinical social work No    Clinical Social Worker follow up needed: No.  If yes, follow up plan:  Beverely Pace, [place], LCSW Clinical Social Worker Phone:  [phone]

## 2019-11-23 ENCOUNTER — Inpatient Hospital Stay: Payer: Medicare Other

## 2019-11-23 ENCOUNTER — Encounter: Payer: Self-pay | Admitting: *Deleted

## 2019-11-23 ENCOUNTER — Encounter: Payer: Self-pay | Admitting: Internal Medicine

## 2019-11-23 ENCOUNTER — Ambulatory Visit
Admission: RE | Admit: 2019-11-23 | Discharge: 2019-11-23 | Disposition: A | Discharge status: Home / Self Care | Payer: Medicare Other | Source: Ambulatory Visit | Attending: Radiation Oncology | Admitting: Radiation Oncology

## 2019-11-23 ENCOUNTER — Encounter: Payer: Self-pay | Admitting: Radiation Oncology

## 2019-11-23 ENCOUNTER — Other Ambulatory Visit: Payer: Self-pay

## 2019-11-23 ENCOUNTER — Inpatient Hospital Stay: Payer: Medicare Other | Attending: Internal Medicine

## 2019-11-23 VITALS — BP 132/81 | HR 71 | Temp 97.9°F | Resp 17

## 2019-11-23 DIAGNOSIS — Z510 Encounter for antineoplastic radiation therapy: Secondary | ICD-10-CM | POA: Diagnosis not present

## 2019-11-23 DIAGNOSIS — C3412 Malignant neoplasm of upper lobe, left bronchus or lung: Secondary | ICD-10-CM | POA: Insufficient documentation

## 2019-11-23 DIAGNOSIS — C3492 Malignant neoplasm of unspecified part of left bronchus or lung: Secondary | ICD-10-CM

## 2019-11-23 DIAGNOSIS — Z5111 Encounter for antineoplastic chemotherapy: Secondary | ICD-10-CM | POA: Insufficient documentation

## 2019-11-23 LAB — CMP (CANCER CENTER ONLY)
ALT: 16 U/L (ref 0–44)
AST: 14 U/L — ABNORMAL LOW (ref 15–41)
Albumin: 3.7 g/dL (ref 3.5–5.0)
Alkaline Phosphatase: 98 U/L (ref 38–126)
Anion gap: 6 (ref 5–15)
BUN: 12 mg/dL (ref 8–23)
CO2: 25 mmol/L (ref 22–32)
Calcium: 9.1 mg/dL (ref 8.9–10.3)
Chloride: 107 mmol/L (ref 98–111)
Creatinine: 0.68 mg/dL (ref 0.44–1.00)
GFR, Est AFR Am: >60 mL/min (ref >60)
GFR, Estimated: >60 mL/min (ref >60)
Glucose, Bld: 98 mg/dL (ref 70–99)
Potassium: 4 mmol/L (ref 3.5–5.1)
Sodium: 138 mmol/L (ref 135–145)
Total Bilirubin: 0.3 mg/dL (ref 0.3–1.2)
Total Protein: 6.3 g/dL — ABNORMAL LOW (ref 6.5–8.1)

## 2019-11-23 LAB — CBC WITH DIFFERENTIAL (CANCER CENTER ONLY)
Abs Immature Granulocytes: 0.04 10*3/uL (ref 0.00–0.07)
Basophils Absolute: 0 10*3/uL (ref 0.0–0.1)
Basophils Relative: 0 %
Eosinophils Absolute: 0.2 10*3/uL (ref 0.0–0.5)
Eosinophils Relative: 3 %
HCT: 38.4 % (ref 36.0–46.0)
Hemoglobin: 13.1 g/dL (ref 12.0–15.0)
Immature Granulocytes: 1 %
Lymphocytes Relative: 21 %
Lymphs Abs: 1.5 10*3/uL (ref 0.7–4.0)
MCH: 31.1 pg (ref 26.0–34.0)
MCHC: 34.1 g/dL (ref 30.0–36.0)
MCV: 91.2 fL (ref 80.0–100.0)
Monocytes Absolute: 0.7 10*3/uL (ref 0.1–1.0)
Monocytes Relative: 10 %
Neutro Abs: 4.5 10*3/uL (ref 1.7–7.7)
Neutrophils Relative %: 65 %
Platelet Count: 257 10*3/uL (ref 150–400)
RBC: 4.21 MIL/uL (ref 3.87–5.11)
RDW: 12.2 % (ref 11.5–15.5)
WBC Count: 7 10*3/uL (ref 4.0–10.5)
nRBC: 0 % (ref 0.0–0.2)

## 2019-11-23 MED ORDER — SODIUM CHLORIDE 0.9 % IV SOLN
45.0000 mg/m2 | Freq: Once | INTRAVENOUS | Status: AC
  Administered 2019-11-23: 84 mg via INTRAVENOUS
  Filled 2019-11-23: qty 14

## 2019-11-23 MED ORDER — SODIUM CHLORIDE 0.9 % IV SOLN
186.6000 mg | Freq: Once | INTRAVENOUS | Status: AC
  Administered 2019-11-23: 190 mg via INTRAVENOUS
  Filled 2019-11-23: qty 19

## 2019-11-23 MED ORDER — FAMOTIDINE IN NACL 20-0.9 MG/50ML-% IV SOLN
20.0000 mg | Freq: Once | INTRAVENOUS | Status: AC
  Administered 2019-11-23: 20 mg via INTRAVENOUS

## 2019-11-23 MED ORDER — FAMOTIDINE IN NACL 20-0.9 MG/50ML-% IV SOLN
INTRAVENOUS | Status: AC
  Filled 2019-11-23: qty 50

## 2019-11-23 MED ORDER — SODIUM CHLORIDE 0.9 % IV SOLN
Freq: Once | INTRAVENOUS | Status: AC
  Filled 2019-11-23: qty 250

## 2019-11-23 MED ORDER — PALONOSETRON HCL INJECTION 0.25 MG/5ML
0.2500 mg | Freq: Once | INTRAVENOUS | Status: AC
  Administered 2019-11-23: 0.25 mg via INTRAVENOUS

## 2019-11-23 MED ORDER — PALONOSETRON HCL INJECTION 0.25 MG/5ML
INTRAVENOUS | Status: AC
  Filled 2019-11-23: qty 5

## 2019-11-23 MED ORDER — DIPHENHYDRAMINE HCL 50 MG/ML IJ SOLN
INTRAMUSCULAR | Status: AC
  Filled 2019-11-23: qty 1

## 2019-11-23 MED ORDER — DIPHENHYDRAMINE HCL 50 MG/ML IJ SOLN
50.0000 mg | Freq: Once | INTRAMUSCULAR | Status: AC
  Administered 2019-11-23: 50 mg via INTRAVENOUS

## 2019-11-23 MED ORDER — SODIUM CHLORIDE 0.9 % IV SOLN
20.0000 mg | Freq: Once | INTRAVENOUS | Status: AC
  Administered 2019-11-23: 20 mg via INTRAVENOUS
  Filled 2019-11-23: qty 20

## 2019-11-23 NOTE — Progress Notes (Signed)
Oncology Nurse Navigator Documentation  Oncology Nurse Navigator Flowsheets 11/23/2019  Abnormal Finding Date 10/13/2019  Confirmed Diagnosis Date 10/20/2019  Diagnosis Status Confirmed Diagnosis Complete  Planned Course of Treatment Chemotherapy;Radiation  Phase of Treatment Radiation  Chemotherapy Actual Start Date: 11/16/2019  Radiation Actual Start Date: 11/23/2019  Navigator Follow Up Date: 11/23/2019  Navigator Follow Up Reason: Appointment Review  Navigator Location [facility]  Navigator Encounter Type Other:  Treatment Initiated Date 11/16/2019  Patient Visit Type Other  Treatment Phase Treatment  Barriers/Navigation Needs Coordination of Care  Interventions Coordination of Care  Acuity Level 2-Minimal Needs (1-2 Barriers Identified)  Coordination of Care Other  Time Spent with Patient 30

## 2019-11-23 NOTE — Progress Notes (Signed)
Met with patient at registration to introduce myself as Arboriculturist and to offer available resources.  Discussed one-time $1000 Radio broadcast assistant to assist with personal expenses while going through treatment.  Gave her my card if interested in applying and for any additional financial questions or concerns. She states she would rather save it for someone who really needs it.

## 2019-11-23 NOTE — Patient Instructions (Signed)
[facility] Discharge Instructions for Patients Receiving Chemotherapy  Today you received the following chemotherapy agents: taxol, carboplatin  To help prevent nausea and vomiting after your treatment, we encourage you to take your nausea medication as directed.    If you develop nausea and vomiting that is not controlled by your nausea medication, call the clinic.   BELOW ARE SYMPTOMS THAT SHOULD BE REPORTED IMMEDIATELY:  *FEVER GREATER THAN 100.5 F  *CHILLS WITH OR WITHOUT FEVER  NAUSEA AND VOMITING THAT IS NOT CONTROLLED WITH YOUR NAUSEA MEDICATION  *UNUSUAL SHORTNESS OF BREATH  *UNUSUAL BRUISING OR BLEEDING  TENDERNESS IN MOUTH AND THROAT WITH OR WITHOUT PRESENCE OF ULCERS  *URINARY PROBLEMS  *BOWEL PROBLEMS  UNUSUAL RASH Items with * indicate a potential emergency and should be followed up as soon as possible.  Feel free to call the clinic should you have any questions or concerns. The clinic phone number is [phone].  Please show the [facility] at check-in to the Emergency Department and triage nurse.

## 2019-11-24 ENCOUNTER — Other Ambulatory Visit: Payer: Medicare Other

## 2019-11-26 ENCOUNTER — Ambulatory Visit
Admission: RE | Admit: 2019-11-26 | Discharge: 2019-11-26 | Disposition: A | Discharge status: Home / Self Care | Payer: Medicare Other | Source: Ambulatory Visit | Attending: Radiation Oncology | Admitting: Radiation Oncology

## 2019-11-26 ENCOUNTER — Other Ambulatory Visit: Payer: Self-pay

## 2019-11-26 ENCOUNTER — Ambulatory Visit: Payer: Medicare Other | Admitting: Radiation Oncology

## 2019-11-26 DIAGNOSIS — C3492 Malignant neoplasm of unspecified part of left bronchus or lung: Secondary | ICD-10-CM

## 2019-11-26 DIAGNOSIS — Z510 Encounter for antineoplastic radiation therapy: Secondary | ICD-10-CM | POA: Diagnosis not present

## 2019-11-27 ENCOUNTER — Ambulatory Visit
Admission: RE | Admit: 2019-11-27 | Discharge: 2019-11-27 | Disposition: A | Discharge status: Home / Self Care | Payer: Medicare Other | Source: Ambulatory Visit | Attending: Radiation Oncology | Admitting: Radiation Oncology

## 2019-11-27 DIAGNOSIS — Z510 Encounter for antineoplastic radiation therapy: Secondary | ICD-10-CM | POA: Diagnosis not present

## 2019-11-27 NOTE — Progress Notes (Signed)
[facility] OFFICE PROGRESS NOTE  Linda Evens, MD [place] [address]  DIAGNOSIS: At least stage IIIc (T3, N3, M0)non-small cell lung cancer, adenocarcinoma presented with large left upper lobe lung mass in addition to left infrahilar and right hilar and subcarinal lymphadenopathy diagnosed in August 2021.  Molecular Biomarkers: Insufficient tissue for foundation 1. Guardant 360 results pending.  PRIOR THERAPY: None  CURRENT THERAPY: Weekly concurrent chemoradiation with carboplatin for an AUC of 2 and paclitaxel 45 mg per metered squared.  First dose expected on 11/16/2019. Status post 2 cycles.   INTERVAL HISTORY: Linda Woods 69 y.o. female returns to the clinic today for a follow-up visit.  The patient is feeling well today without any concerning complaints except for some left sided neck tenderness which is the symptoms that prompted her initial evaluation into her recent lung cancer diagnosis. She takes NSAIDs with relief. The neck soreness is worse in the AM. The patient is status post her first two cycles of weekly concurrent chemoradiation and she has been tolerating it well. Her last day of radiation is scheduled for 01/06/20.  Today she denies any recent fever, chills, night sweats, or weight loss.  She denies any chest pain or hemoptysis. She has mild dyspnea on exertion with taking the stairs. She reports baseline productive cough which is worse in the AM and with activity. She takes cough medication if it becomes troublesome. She denies any nausea, diarrhea,  or vomiting. She reports mild constipation for which she takes stool softener. She denies any headache or visual changes.  She is here today for evaluation before starting cycle #3 of her treatment.  MEDICAL HISTORY: Past Medical History:  Diagnosis Date  . Anemia    as a child  . Anxiety   . Bursitis   . Chronic reflux esophagitis   . Complication of anesthesia   . Diarrhea,  functional   . Diverticulosis   . GERD (gastroesophageal reflux disease)   . History of kidney stones   . Hypertension   . Knee pain    right knee-seeing ortho  . Osteoarthritis   . Plantar fasciitis   . PONV (postoperative nausea and vomiting)    has used the patch before and that helps  . Pure hypercholesterolemia   . RLS (restless legs syndrome)   . Superficial vein thrombosis   . Thyroid disease    hypothyroid    ALLERGIES:  is allergic to crestor [rosuvastatin calcium] and statins.  MEDICATIONS:  Current Outpatient Medications  Medication Sig Dispense Refill  . cholecalciferol (VITAMIN D3) 25 MCG (1000 UNIT) tablet Take 1,000 Units by mouth daily. Pt states she take 2000 units/day    . diphenoxylate-atropine (LOMOTIL) 2.5-0.025 MG tablet Take 1 tablet by mouth daily as needed for diarrhea or loose stools.     . famotidine (PEPCID) 40 MG tablet Take 40 mg by mouth See admin instructions. Five times a week    . fexofenadine (ALLEGRA) 180 MG tablet Take 180 mg by mouth daily.    . fluticasone (FLONASE) 50 MCG/ACT nasal spray Place 2 sprays into both nostrils daily.    Marland Kitchen HYDROcodone-acetaminophen (NORCO/VICODIN) 5-325 MG tablet Take 0.5 tablets by mouth daily as needed (Knee pain).     Marland Kitchen HYDROcodone-homatropine (HYCODAN) 5-1.5 MG/5ML syrup Take 5 mLs by mouth daily as needed for cough.    Marland Kitchen ibuprofen (ADVIL) 200 MG tablet Take 400 mg by mouth every 6 (six) hours as needed (Knee pain).    Marland Kitchen  LORazepam (ATIVAN) 0.5 MG tablet Take 0.5 mg by mouth at bedtime.     . melatonin 5 MG TABS Take 5 mg by mouth at bedtime.    Marland Kitchen oxymetazoline (AFRIN) 0.05 % nasal spray Place 1 spray into both nostrils daily as needed for congestion.    . prochlorperazine (COMPAZINE) 10 MG tablet Take 1 tablet (10 mg total) by mouth every 6 (six) hours as needed for nausea or vomiting. 30 tablet 0  . RABEprazole (ACIPHEX) 20 MG tablet Take 1 tablet (20 mg total) by mouth 2 (two) times a week.    Marland Kitchen SYNTHROID 112  MCG tablet Take 112 mcg by mouth daily before breakfast.     . metoprolol succinate (TOPROL XL) 50 MG 24 hr tablet Take 50 mg by mouth daily.      No current facility-administered medications for this visit.    SURGICAL HISTORY:  Past Surgical History:  Procedure Laterality Date  . APPENDECTOMY    . BRONCHIAL BIOPSY  10/20/2019   Procedure: BRONCHIAL BIOPSIES;  Surgeon: Collene Gobble, MD;  Location: [facility] ENDOSCOPY;  Service: Pulmonary;;  . BRONCHIAL BRUSHINGS  10/20/2019   Procedure: BRONCHIAL BRUSHINGS;  Surgeon: Collene Gobble, MD;  Location: [facility] ENDOSCOPY;  Service: Pulmonary;;  . BRONCHIAL NEEDLE ASPIRATION BIOPSY  10/20/2019   Procedure: BRONCHIAL NEEDLE ASPIRATION BIOPSIES;  Surgeon: Collene Gobble, MD;  Location: [facility];  Service: Pulmonary;;  . BRONCHIAL WASHINGS  10/20/2019   Procedure: BRONCHIAL WASHINGS;  Surgeon: Collene Gobble, MD;  Location: [facility] ENDOSCOPY;  Service: Pulmonary;;  . CARPAL TUNNEL RELEASE Right 2011   Dr. Amedeo Plenty  . COLPORRHAPHY     posterior  . HAND SURGERY Right 2016   Nerve surgery, Dr. Amedeo Plenty  . OVARIAN CYST REMOVAL    . RECTOCELE REPAIR  2011   w/TVH and sling  . TONSILLECTOMY    . TONSILLECTOMY    . TOTAL KNEE ARTHROPLASTY Left 09/09/2018   Procedure: TOTAL KNEE ARTHROPLASTY, CORTISONE INJECTION RIGHT KNEE;  Surgeon: Paralee Cancel, MD;  Location: [facility] ORS;  Service: Orthopedics;  Laterality: Left;  70 mins  . TOTAL VAGINAL HYSTERECTOMY  10/18/2009   rectocele repair, sling  . TUBAL LIGATION Bilateral   . VARICOSE VEIN SURGERY    . VIDEO BRONCHOSCOPY WITH ENDOBRONCHIAL NAVIGATION N/A 10/20/2019   Procedure: VIDEO BRONCHOSCOPY WITH ENDOBRONCHIAL NAVIGATION;  Surgeon: Collene Gobble, MD;  Location: [facility] ENDOSCOPY;  Service: Pulmonary;  Laterality: N/A;    REVIEW OF SYSTEMS:   Constitutional: Negative for appetite change, chills, fatigue, fever and unexpected weight change.  HENT: Negative for mouth sores, nosebleeds, sore throat and trouble  swallowing.   Eyes: Negative for eye problems and icterus.  Respiratory: Positive for mild dyspnea on exertion with stairs and mild cough. Negative for hemoptysis, shortness of breath and wheezing.   Cardiovascular: Negative for chest pain and leg swelling.  Gastrointestinal: Positive for mild constipation. Negative for abdominal pain, diarrhea, nausea and vomiting.  Genitourinary: Negative for bladder incontinence, difficulty urinating, dysuria, frequency and hematuria.   Musculoskeletal: Positive for neck pain. Negative for back pain, gait problem, and neck stiffness.  Skin: Negative for itching and rash.  Neurological: Negative for dizziness, extremity weakness, gait problem, headaches, light-headedness and seizures.  Hematological: Negative for adenopathy. Does not bruise/bleed easily.  Psychiatric/Behavioral: Negative for confusion, depression and sleep disturbance. The patient is not nervous/anxious.     PHYSICAL EXAMINATION:  Blood pressure (!) 134/97, pulse 72, temperature 97.9 F (36.6 C), temperature source Tympanic, resp. rate 18,  height 5\' 5"  (1.651 m), weight 178 lb (80.7 kg), last menstrual period 08/21/2002, SpO2 100 %.  ECOG PERFORMANCE STATUS: 1 - Symptomatic but completely ambulatory  Physical Exam  Constitutional: Oriented to person, place, and time and well-developed, well-nourished, and in no distress.   HENT:  Head: Normocephalic and atraumatic.  Mouth/Throat: Oropharynx is clear and moist. No oropharyngeal exudate.  Eyes: Conjunctivae are normal. Right eye exhibits no discharge. Left eye exhibits no discharge. No scleral icterus.  Neck: Normal range of motion. Neck supple.  Cardiovascular: Normal rate, regular rhythm, normal heart sounds and intact distal pulses.   Pulmonary/Chest: Effort normal and breath sounds normal. No respiratory distress. No wheezes. No rales.  Abdominal: Soft. Bowel sounds are normal. Exhibits no distension and no mass. There is no  tenderness.  Musculoskeletal: Normal range of motion. Exhibits no edema.  Lymphadenopathy:    No cervical adenopathy.  Neurological: Alert and oriented to person, place, and time. Exhibits normal muscle tone. Gait normal. Coordination normal.  Skin: Skin is warm and dry. No rash noted. Not diaphoretic. No erythema. No pallor.  Psychiatric: Mood, memory and judgment normal.  Vitals reviewed.  LABORATORY DATA: Lab Results  Component Value Date   WBC 6.1 12/01/2019   HGB 13.0 12/01/2019   HCT 38.6 12/01/2019   MCV 93.0 12/01/2019   PLT 246 12/01/2019      Chemistry      Component Value Date/Time   NA 139 12/01/2019 0935   K 4.6 12/01/2019 0935   CL 105 12/01/2019 0935   CO2 28 12/01/2019 0935   BUN 8 12/01/2019 0935   CREATININE 0.68 12/01/2019 0935      Component Value Date/Time   CALCIUM 9.6 12/01/2019 0935   ALKPHOS 106 12/01/2019 0935   AST 13 (L) 12/01/2019 0935   ALT 14 12/01/2019 0935   BILITOT 0.4 12/01/2019 0935       RADIOGRAPHIC STUDIES:  DG Chest 2 View  Result Date: 11/20/2019 CLINICAL DATA:  Recent pneumonia. EXAM: CHEST - 2 VIEW COMPARISON:  Chest radiograph October 28, 2019; PET-CT November 02, 2019 FINDINGS: The mass noted on recent PET study in the left upper lobe anteriorly is again present with surrounding pneumonitis. Exact dimensions of the mass are difficult to ascertain given the surrounding infiltrate. Elsewhere lungs are clear. Heart size and pulmonary vascular normal. No adenopathy. No pneumothorax. No bone lesions. IMPRESSION: Anterior segment left upper lobe mass with surrounding pneumonitis persists, grossly stable. No pneumothorax. No new opacity. Heart size normal. No adenopathy appreciable by radiography. Electronically Signed   By: Lowella Grip III M.D.   On: 11/20/2019 09:56   MR Brain W Wo Contrast  Result Date: 11/13/2019 CLINICAL DATA:  Non-small cell lung cancer, staging. Follow-up of lesions described on prior MRI. EXAM: MRI  HEAD WITHOUT AND WITH CONTRAST TECHNIQUE: Multiplanar, multiecho pulse sequences of the brain and surrounding structures were obtained without and with intravenous contrast. CONTRAST:  63mL MULTIHANCE GADOBENATE DIMEGLUMINE 529 MG/ML IV SOLN COMPARISON:  MRI of the brain November 06, 2019 FINDINGS: Brain: No acute infarction, hemorrhage, hydrocephalus, extra-axial collection or mass lesion. Previously described enhancing focus in the inferior left cerebellar hemisphere is not demonstrated in the current exam. No focus of abnormal contrast enhancement is identified. Vascular: Normal flow voids. Developmental venous anomaly in the left frontal lobe. Skull and upper cervical spine: Normal marrow signal. Sinuses/Orbits: Negative. Other: Minimal left mastoid effusion. IMPRESSION: No evidence of intracranial metastatic disease. Previously described enhancing focus in the inferior left cerebellar  hemisphere is not demonstrated in the current exam. Electronically Signed   By: Pedro Earls M.D.   On: 11/13/2019 15:37   MR BRAIN W WO CONTRAST  Result Date: 11/06/2019 CLINICAL DATA:  Non-small cell lung cancer, staging. EXAM: MRI HEAD WITHOUT AND WITH CONTRAST TECHNIQUE: Multiplanar, multiecho pulse sequences of the brain and surrounding structures were obtained without and with intravenous contrast. CONTRAST:  42mL MULTIHANCE GADOBENATE DIMEGLUMINE 529 MG/ML IV SOLN COMPARISON:  07/28/2017 MRI head.  05/04/2005 head CT. FINDINGS: Brain: 4 mm enhancing left cerebellar focus (14:32). 5 mm enhancing inferior left cerebellar focus. Punctate scattered FLAIR hyperintense foci involving the supratentorial white matter is nonspecific. No acute infarct or intracranial hemorrhage. No midline shift, ventriculomegaly or extra-axial fluid collection. No mass lesion. Cerebral volume within normal limits. Vascular: Normal flow voids. Left frontal developmental venous anomaly. Skull and upper cervical spine: Normal  marrow signal. Sinuses/Orbits: Normal orbits. Clear paranasal sinuses. Trace bilateral mastoid effusions. Other: None. IMPRESSION: Enhancing 4-5 mm left cerebellar foci may reflect metastases versus artifact given the amount of motion at this level. No enhancing intracranial lesions elsewhere. Electronically Signed   By: Primitivo Gauze M.D.   On: 11/06/2019 15:25   NM PET Image Initial (PI) Skull Base To Thigh  Result Date: 11/02/2019 CLINICAL DATA:  Initial treatment strategy for left upper lobe adenocarcinoma. EXAM: NUCLEAR MEDICINE PET SKULL BASE TO THIGH TECHNIQUE: 8.5 mCi F-18 FDG was injected intravenously. Full-ring PET imaging was performed from the skull base to thigh after the radiotracer. CT data was obtained and used for attenuation correction and anatomic localization. Fasting blood glucose: 103 mg/dl COMPARISON:  CT scan 10/14/2019 FINDINGS: Mediastinal blood pool activity: SUV max 2.8 Liver activity: SUV max NA NECK: Left level V lymph node measures 0.7 cm in short axis on image 30/4 (formerly 0.9 cm by my measurement) and has maximum SUV 2.8, similar to blood pool. Incidental CT findings: none CHEST: Somewhat irregular 6.6 by 3.6 cm left upper lobe mass image 26/8, maximum SUV 15.3, compatible with malignancy. Contralateral enlarged right hilar lymph node with maximum SUV 5.1, favoring malignant involvement. Left infrahilar lymph node maximum SUV 4.5. Subcarinal lymph nodes measuring up to 1.1 cm in diameter with maximum SUV 4.4. Small left axillary lymph nodes including a 0.6 cm left axillary node on image 54 series 4 with maximum SUV 3.2. A lower thoracic para-aortic lymph node measuring 0.6 cm in short axis on image 93/4 has maximum SUV of 2.8, similar to blood pool. Incidental CT findings: Small left pleural effusion. Small amount of subcutaneous gas along the left breast likely related to prior chest tube. Mild atherosclerotic calcification of the aortic arch. Small type 1 hiatal hernia  containing stomach and a small amount of fluid. Patchy postobstructive pneumonitis in the left upper lobe. ABDOMEN/PELVIS: Photopenic cyst in the right kidney upper pole. Clustered left para-aortic lymph nodes measuring up to 0.8 cm in short axis on image 118/4 with maximum SUV up to 3.2, nonspecific. Incidental CT findings: Mild hepatic steatosis. Mild prominence of the lateral segment left hepatic lobe. 1.2 by 1.0 cm nodule of the right adrenal gland with nonspecific density but no obvious accentuated metabolic activity, probably merits surveillance. Sigmoid diverticulosis. SKELETON: No significant abnormal hypermetabolic activity in this region. Incidental CT findings: Lumbar spondylosis and degenerative disc disease. There findings of chronic avascular necrosis in the left femoral head, without flattening. IMPRESSION: 1. Hypermetabolic left upper lobe mass with maximum SUV 15.3. Postobstructive pneumonitis in the left upper lobe.  Hypermetabolic adenopathy in the chest includes the contralateral right hilar lymph node; a left infrahilar node; and subcarinal nodes. 2. A small mass of the right adrenal gland is not appreciably hypermetabolic but given its small size, I would suggest surveillance. 3. A left level V lymph node in the neck measures only 0.7 cm in short axis and has a activity similar to blood. Probably not malignant but consider surveillance. 4. Other imaging findings of potential clinical significance: Small left pleural effusion. Aortic Atherosclerosis (ICD10-I70.0). Small type 1 hiatal hernia. Mild hepatic steatosis. Sigmoid diverticulosis. 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 Electronically Signed   By: Van Clines M.D.   On: 11/02/2019 17:06     ASSESSMENT/PLAN:  This is a very pleasant 69 year old Caucasian female who is a never smoker who was recently diagnosed with at least stage IIIc (T3, n3, M0) non-small cell lung cancer, adenocarcinoma.  She  presented with a large left upper lobe lung mass in addition to left infrahilar and right hilar and subcarinal lymphadenopathy.  Brain MRI was negative for metastatic disease.  She was diagnosed in August 2021.  There is not enough material for foundation one testing.   The patient is currently undergoing concurrent chemoradiation with carboplatin for an AUC of 2 and paclitaxel 45 mg/m.  She is status post 2 cycles  The patient is currently undergoing radiation under the care of Dr. Sondra Come.  Her last treatment is expected on 01/06/2020.   Labs were reviewed.  Recommend that she proceed with cycle #3 today as scheduled.  We will see her back in 2 weeks for evaluation before starting cycle #5.  The patient was advised to call immediately if she has any concerning symptoms in the interval. The patient voices understanding of current disease status and treatment options and is in agreement with the current care plan. All questions were answered. The patient knows to call the clinic with any problems, questions or concerns. We can certainly see the patient much sooner if necessary     No orders of the defined types were placed in this encounter.    Aivy Akter L Channel Papandrea, PA-C 12/01/19

## 2019-11-29 ENCOUNTER — Other Ambulatory Visit: Payer: Medicare Other

## 2019-11-30 ENCOUNTER — Ambulatory Visit
Admission: RE | Admit: 2019-11-30 | Discharge: 2019-11-30 | Disposition: A | Discharge status: Home / Self Care | Payer: Medicare Other | Source: Ambulatory Visit | Attending: Radiation Oncology | Admitting: Radiation Oncology

## 2019-11-30 DIAGNOSIS — Z510 Encounter for antineoplastic radiation therapy: Secondary | ICD-10-CM | POA: Diagnosis not present

## 2019-12-01 ENCOUNTER — Inpatient Hospital Stay: Payer: Medicare Other

## 2019-12-01 ENCOUNTER — Ambulatory Visit
Admission: RE | Admit: 2019-12-01 | Discharge: 2019-12-01 | Disposition: A | Discharge status: Home / Self Care | Payer: Medicare Other | Source: Ambulatory Visit | Attending: Radiation Oncology | Admitting: Radiation Oncology

## 2019-12-01 ENCOUNTER — Other Ambulatory Visit: Payer: Self-pay

## 2019-12-01 ENCOUNTER — Inpatient Hospital Stay: Payer: Medicare Other | Admitting: Physician Assistant

## 2019-12-01 VITALS — BP 134/97 | HR 72 | Temp 97.9°F | Resp 18 | Ht 65.0 in | Wt 178.0 lb

## 2019-12-01 DIAGNOSIS — Z5111 Encounter for antineoplastic chemotherapy: Secondary | ICD-10-CM

## 2019-12-01 DIAGNOSIS — C3492 Malignant neoplasm of unspecified part of left bronchus or lung: Secondary | ICD-10-CM

## 2019-12-01 DIAGNOSIS — Z510 Encounter for antineoplastic radiation therapy: Secondary | ICD-10-CM | POA: Diagnosis not present

## 2019-12-01 LAB — CMP (CANCER CENTER ONLY)
ALT: 14 U/L (ref 0–44)
AST: 13 U/L — ABNORMAL LOW (ref 15–41)
Albumin: 3.8 g/dL (ref 3.5–5.0)
Alkaline Phosphatase: 106 U/L (ref 38–126)
Anion gap: 6 (ref 5–15)
BUN: 8 mg/dL (ref 8–23)
CO2: 28 mmol/L (ref 22–32)
Calcium: 9.6 mg/dL (ref 8.9–10.3)
Chloride: 105 mmol/L (ref 98–111)
Creatinine: 0.68 mg/dL (ref 0.44–1.00)
GFR, Estimated: >60 mL/min (ref >60)
Glucose, Bld: 98 mg/dL (ref 70–99)
Potassium: 4.6 mmol/L (ref 3.5–5.1)
Sodium: 139 mmol/L (ref 135–145)
Total Bilirubin: 0.4 mg/dL (ref 0.3–1.2)
Total Protein: 6.7 g/dL (ref 6.5–8.1)

## 2019-12-01 LAB — CBC WITH DIFFERENTIAL (CANCER CENTER ONLY)
Abs Immature Granulocytes: 0.03 10*3/uL (ref 0.00–0.07)
Basophils Absolute: 0 10*3/uL (ref 0.0–0.1)
Basophils Relative: 0 %
Eosinophils Absolute: 0.1 10*3/uL (ref 0.0–0.5)
Eosinophils Relative: 2 %
HCT: 38.6 % (ref 36.0–46.0)
Hemoglobin: 13 g/dL (ref 12.0–15.0)
Immature Granulocytes: 1 %
Lymphocytes Relative: 15 %
Lymphs Abs: 0.9 10*3/uL (ref 0.7–4.0)
MCH: 31.3 pg (ref 26.0–34.0)
MCHC: 33.7 g/dL (ref 30.0–36.0)
MCV: 93 fL (ref 80.0–100.0)
Monocytes Absolute: 0.6 10*3/uL (ref 0.1–1.0)
Monocytes Relative: 10 %
Neutro Abs: 4.5 10*3/uL (ref 1.7–7.7)
Neutrophils Relative %: 72 %
Platelet Count: 246 10*3/uL (ref 150–400)
RBC: 4.15 MIL/uL (ref 3.87–5.11)
RDW: 12.7 % (ref 11.5–15.5)
WBC Count: 6.1 10*3/uL (ref 4.0–10.5)
nRBC: 0 % (ref 0.0–0.2)

## 2019-12-01 MED ORDER — FAMOTIDINE IN NACL 20-0.9 MG/50ML-% IV SOLN
20.0000 mg | Freq: Once | INTRAVENOUS | Status: AC
  Administered 2019-12-01: 20 mg via INTRAVENOUS

## 2019-12-01 MED ORDER — PALONOSETRON HCL INJECTION 0.25 MG/5ML
0.2500 mg | Freq: Once | INTRAVENOUS | Status: AC
  Administered 2019-12-01: 0.25 mg via INTRAVENOUS

## 2019-12-01 MED ORDER — SODIUM CHLORIDE 0.9 % IV SOLN
20.0000 mg | Freq: Once | INTRAVENOUS | Status: AC
  Administered 2019-12-01: 20 mg via INTRAVENOUS
  Filled 2019-12-01: qty 20

## 2019-12-01 MED ORDER — DIPHENHYDRAMINE HCL 50 MG/ML IJ SOLN
INTRAMUSCULAR | Status: AC
  Filled 2019-12-01: qty 1

## 2019-12-01 MED ORDER — DIPHENHYDRAMINE HCL 50 MG/ML IJ SOLN
50.0000 mg | Freq: Once | INTRAMUSCULAR | Status: AC
  Administered 2019-12-01: 50 mg via INTRAVENOUS

## 2019-12-01 MED ORDER — PALONOSETRON HCL INJECTION 0.25 MG/5ML
INTRAVENOUS | Status: AC
  Filled 2019-12-01: qty 5

## 2019-12-01 MED ORDER — SODIUM CHLORIDE 0.9 % IV SOLN
Freq: Once | INTRAVENOUS | Status: AC
  Filled 2019-12-01: qty 250

## 2019-12-01 MED ORDER — SODIUM CHLORIDE 0.9 % IV SOLN
45.0000 mg/m2 | Freq: Once | INTRAVENOUS | Status: AC
  Administered 2019-12-01: 84 mg via INTRAVENOUS
  Filled 2019-12-01: qty 14

## 2019-12-01 MED ORDER — SODIUM CHLORIDE 0.9 % IV SOLN
186.6000 mg | Freq: Once | INTRAVENOUS | Status: AC
  Administered 2019-12-01: 190 mg via INTRAVENOUS
  Filled 2019-12-01: qty 19

## 2019-12-01 MED ORDER — FAMOTIDINE IN NACL 20-0.9 MG/50ML-% IV SOLN
INTRAVENOUS | Status: AC
  Filled 2019-12-01: qty 50

## 2019-12-01 NOTE — Patient Instructions (Signed)
[facility] Discharge Instructions for Patients Receiving Chemotherapy  Today you received the following chemotherapy agents: Paclitaxel and Carboplatin  To help prevent nausea and vomiting after your treatment, we encourage you to take your nausea medication  as prescribed.    If you develop nausea and vomiting that is not controlled by your nausea medication, call the clinic.   BELOW ARE SYMPTOMS THAT SHOULD BE REPORTED IMMEDIATELY:  *FEVER GREATER THAN 100.5 F  *CHILLS WITH OR WITHOUT FEVER  NAUSEA AND VOMITING THAT IS NOT CONTROLLED WITH YOUR NAUSEA MEDICATION  *UNUSUAL SHORTNESS OF BREATH  *UNUSUAL BRUISING OR BLEEDING  TENDERNESS IN MOUTH AND THROAT WITH OR WITHOUT PRESENCE OF ULCERS  *URINARY PROBLEMS  *BOWEL PROBLEMS  UNUSUAL RASH Items with * indicate a potential emergency and should be followed up as soon as possible.  Feel free to call the clinic should you have any questions or concerns. The clinic phone number is [phone].  Please show the [place] at check-in to the Emergency Department and triage nurse.

## 2019-12-02 ENCOUNTER — Encounter: Payer: Self-pay | Admitting: Internal Medicine

## 2019-12-02 ENCOUNTER — Ambulatory Visit
Admission: RE | Admit: 2019-12-02 | Discharge: 2019-12-02 | Disposition: A | Discharge status: Home / Self Care | Payer: Medicare Other | Source: Ambulatory Visit | Attending: Radiation Oncology | Admitting: Radiation Oncology

## 2019-12-02 DIAGNOSIS — Z510 Encounter for antineoplastic radiation therapy: Secondary | ICD-10-CM | POA: Diagnosis not present

## 2019-12-03 ENCOUNTER — Ambulatory Visit
Admission: RE | Admit: 2019-12-03 | Discharge: 2019-12-03 | Disposition: A | Discharge status: Home / Self Care | Payer: Medicare Other | Source: Ambulatory Visit | Attending: Radiation Oncology | Admitting: Radiation Oncology

## 2019-12-03 DIAGNOSIS — Z510 Encounter for antineoplastic radiation therapy: Secondary | ICD-10-CM | POA: Diagnosis not present

## 2019-12-03 DIAGNOSIS — C3492 Malignant neoplasm of unspecified part of left bronchus or lung: Secondary | ICD-10-CM

## 2019-12-03 LAB — GUARDANT 360

## 2019-12-03 MED ORDER — SONAFINE EX EMUL
1.0000 "application " | Freq: Two times a day (BID) | CUTANEOUS | Status: DC
  Administered 2019-12-03: 1 via TOPICAL

## 2019-12-04 ENCOUNTER — Ambulatory Visit
Admission: RE | Admit: 2019-12-04 | Discharge: 2019-12-04 | Disposition: A | Discharge status: Home / Self Care | Payer: Medicare Other | Source: Ambulatory Visit | Attending: Radiation Oncology | Admitting: Radiation Oncology

## 2019-12-04 DIAGNOSIS — Z510 Encounter for antineoplastic radiation therapy: Secondary | ICD-10-CM | POA: Diagnosis not present

## 2019-12-07 ENCOUNTER — Other Ambulatory Visit: Payer: Self-pay

## 2019-12-07 ENCOUNTER — Inpatient Hospital Stay: Payer: Medicare Other

## 2019-12-07 ENCOUNTER — Ambulatory Visit
Admission: RE | Admit: 2019-12-07 | Discharge: 2019-12-07 | Disposition: A | Discharge status: Home / Self Care | Payer: Medicare Other | Source: Ambulatory Visit | Attending: Radiation Oncology | Admitting: Radiation Oncology

## 2019-12-07 ENCOUNTER — Telehealth: Payer: Self-pay | Admitting: Physician Assistant

## 2019-12-07 VITALS — BP 119/79 | HR 78 | Temp 98.2°F | Resp 18 | Ht 65.0 in | Wt 174.5 lb

## 2019-12-07 DIAGNOSIS — Z5111 Encounter for antineoplastic chemotherapy: Secondary | ICD-10-CM | POA: Diagnosis not present

## 2019-12-07 DIAGNOSIS — Z510 Encounter for antineoplastic radiation therapy: Secondary | ICD-10-CM | POA: Diagnosis not present

## 2019-12-07 DIAGNOSIS — C3492 Malignant neoplasm of unspecified part of left bronchus or lung: Secondary | ICD-10-CM

## 2019-12-07 LAB — CMP (CANCER CENTER ONLY)
ALT: 16 U/L (ref 0–44)
AST: 14 U/L — ABNORMAL LOW (ref 15–41)
Albumin: 4 g/dL (ref 3.5–5.0)
Alkaline Phosphatase: 119 U/L (ref 38–126)
Anion gap: 9 (ref 5–15)
BUN: 9 mg/dL (ref 8–23)
CO2: 24 mmol/L (ref 22–32)
Calcium: 9.5 mg/dL (ref 8.9–10.3)
Chloride: 104 mmol/L (ref 98–111)
Creatinine: 0.7 mg/dL (ref 0.44–1.00)
GFR, Estimated: >60 mL/min (ref >60)
Glucose, Bld: 119 mg/dL — ABNORMAL HIGH (ref 70–99)
Potassium: 4.2 mmol/L (ref 3.5–5.1)
Sodium: 137 mmol/L (ref 135–145)
Total Bilirubin: 0.4 mg/dL (ref 0.3–1.2)
Total Protein: 7.1 g/dL (ref 6.5–8.1)

## 2019-12-07 LAB — CBC WITH DIFFERENTIAL (CANCER CENTER ONLY)
Abs Immature Granulocytes: 0.03 10*3/uL (ref 0.00–0.07)
Basophils Absolute: 0 10*3/uL (ref 0.0–0.1)
Basophils Relative: 1 %
Eosinophils Absolute: 0.2 10*3/uL (ref 0.0–0.5)
Eosinophils Relative: 4 %
HCT: 39 % (ref 36.0–46.0)
Hemoglobin: 13.4 g/dL (ref 12.0–15.0)
Immature Granulocytes: 1 %
Lymphocytes Relative: 13 %
Lymphs Abs: 0.6 10*3/uL — ABNORMAL LOW (ref 0.7–4.0)
MCH: 31 pg (ref 26.0–34.0)
MCHC: 34.4 g/dL (ref 30.0–36.0)
MCV: 90.3 fL (ref 80.0–100.0)
Monocytes Absolute: 0.5 10*3/uL (ref 0.1–1.0)
Monocytes Relative: 11 %
Neutro Abs: 3.1 10*3/uL (ref 1.7–7.7)
Neutrophils Relative %: 70 %
Platelet Count: 227 10*3/uL (ref 150–400)
RBC: 4.32 MIL/uL (ref 3.87–5.11)
RDW: 12.6 % (ref 11.5–15.5)
WBC Count: 4.3 10*3/uL (ref 4.0–10.5)
nRBC: 0 % (ref 0.0–0.2)

## 2019-12-07 MED ORDER — PALONOSETRON HCL INJECTION 0.25 MG/5ML
0.2500 mg | Freq: Once | INTRAVENOUS | Status: AC
  Administered 2019-12-07: 0.25 mg via INTRAVENOUS

## 2019-12-07 MED ORDER — SODIUM CHLORIDE 0.9 % IV SOLN
20.0000 mg | Freq: Once | INTRAVENOUS | Status: AC
  Administered 2019-12-07: 20 mg via INTRAVENOUS
  Filled 2019-12-07: qty 20

## 2019-12-07 MED ORDER — DIPHENHYDRAMINE HCL 50 MG/ML IJ SOLN
INTRAMUSCULAR | Status: AC
  Filled 2019-12-07: qty 1

## 2019-12-07 MED ORDER — SODIUM CHLORIDE 0.9 % IV SOLN
Freq: Once | INTRAVENOUS | Status: AC
  Filled 2019-12-07: qty 250

## 2019-12-07 MED ORDER — FAMOTIDINE IN NACL 20-0.9 MG/50ML-% IV SOLN
INTRAVENOUS | Status: AC
  Filled 2019-12-07: qty 50

## 2019-12-07 MED ORDER — SODIUM CHLORIDE 0.9 % IV SOLN
45.0000 mg/m2 | Freq: Once | INTRAVENOUS | Status: AC
  Administered 2019-12-07: 84 mg via INTRAVENOUS
  Filled 2019-12-07: qty 14

## 2019-12-07 MED ORDER — DIPHENHYDRAMINE HCL 50 MG/ML IJ SOLN
50.0000 mg | Freq: Once | INTRAMUSCULAR | Status: AC
  Administered 2019-12-07: 50 mg via INTRAVENOUS

## 2019-12-07 MED ORDER — PALONOSETRON HCL INJECTION 0.25 MG/5ML
INTRAVENOUS | Status: AC
  Filled 2019-12-07: qty 5

## 2019-12-07 MED ORDER — SODIUM CHLORIDE 0.9 % IV SOLN
186.6000 mg | Freq: Once | INTRAVENOUS | Status: AC
  Administered 2019-12-07: 190 mg via INTRAVENOUS
  Filled 2019-12-07: qty 19

## 2019-12-07 MED ORDER — FAMOTIDINE IN NACL 20-0.9 MG/50ML-% IV SOLN
20.0000 mg | Freq: Once | INTRAVENOUS | Status: AC
  Administered 2019-12-07: 20 mg via INTRAVENOUS

## 2019-12-07 NOTE — Patient Instructions (Signed)
[facility] Discharge Instructions for Patients Receiving Chemotherapy  Today you received the following chemotherapy agents: Paclitaxel (Taxol) and Carboplatin  To help prevent nausea and vomiting after your treatment, we encourage you to take your nausea medication  as prescribed.    If you develop nausea and vomiting that is not controlled by your nausea medication, call the clinic.   BELOW ARE SYMPTOMS THAT SHOULD BE REPORTED IMMEDIATELY:  *FEVER GREATER THAN 100.5 F  *CHILLS WITH OR WITHOUT FEVER  NAUSEA AND VOMITING THAT IS NOT CONTROLLED WITH YOUR NAUSEA MEDICATION  *UNUSUAL SHORTNESS OF BREATH  *UNUSUAL BRUISING OR BLEEDING  TENDERNESS IN MOUTH AND THROAT WITH OR WITHOUT PRESENCE OF ULCERS  *URINARY PROBLEMS  *BOWEL PROBLEMS  UNUSUAL RASH Items with * indicate a potential emergency and should be followed up as soon as possible.  Feel free to call the clinic should you have any questions or concerns. The clinic phone number is [phone].  Please show the [place] at check-in to the Emergency Department and triage nurse.

## 2019-12-07 NOTE — Telephone Encounter (Signed)
Called and left msg about added appts to schedule. Mailed printout

## 2019-12-08 ENCOUNTER — Ambulatory Visit
Admission: RE | Admit: 2019-12-08 | Discharge: 2019-12-08 | Disposition: A | Discharge status: Home / Self Care | Payer: Medicare Other | Source: Ambulatory Visit | Attending: Radiation Oncology | Admitting: Radiation Oncology

## 2019-12-08 DIAGNOSIS — Z510 Encounter for antineoplastic radiation therapy: Secondary | ICD-10-CM | POA: Diagnosis not present

## 2019-12-09 ENCOUNTER — Ambulatory Visit
Admission: RE | Admit: 2019-12-09 | Discharge: 2019-12-09 | Disposition: A | Discharge status: Home / Self Care | Payer: Medicare Other | Source: Ambulatory Visit | Attending: Radiation Oncology | Admitting: Radiation Oncology

## 2019-12-09 ENCOUNTER — Other Ambulatory Visit: Payer: Self-pay | Admitting: Radiation Oncology

## 2019-12-09 ENCOUNTER — Telehealth: Payer: Self-pay

## 2019-12-09 DIAGNOSIS — Z510 Encounter for antineoplastic radiation therapy: Secondary | ICD-10-CM | POA: Diagnosis not present

## 2019-12-09 MED ORDER — SUCRALFATE 1 GM/10ML PO SUSP: 1.0000 g | Freq: Three times a day (TID) | ORAL | 1 refills | Status: DC

## 2019-12-09 NOTE — Telephone Encounter (Signed)
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 He is requesting am alternative to the Carafate solution due to the cost of the suspension. Dr. Sondra Come has given a verbal order for Carafate 1 Gram PO qid to be dissolved in 10 ml of warm water 1 hour prior to meals and at bedtime with 1 refill. # 120 tablets to be dispensed.

## 2019-12-10 ENCOUNTER — Ambulatory Visit
Admission: RE | Admit: 2019-12-10 | Discharge: 2019-12-10 | Disposition: A | Discharge status: Home / Self Care | Payer: Medicare Other | Source: Ambulatory Visit | Attending: Radiation Oncology | Admitting: Radiation Oncology

## 2019-12-10 DIAGNOSIS — Z510 Encounter for antineoplastic radiation therapy: Secondary | ICD-10-CM | POA: Diagnosis not present

## 2019-12-11 ENCOUNTER — Ambulatory Visit
Admission: RE | Admit: 2019-12-11 | Discharge: 2019-12-11 | Disposition: A | Discharge status: Home / Self Care | Payer: Medicare Other | Source: Ambulatory Visit | Attending: Radiation Oncology | Admitting: Radiation Oncology

## 2019-12-14 ENCOUNTER — Inpatient Hospital Stay (HOSPITAL_BASED_OUTPATIENT_CLINIC_OR_DEPARTMENT_OTHER): Payer: Medicare Other | Admitting: Internal Medicine

## 2019-12-14 ENCOUNTER — Other Ambulatory Visit: Payer: Self-pay

## 2019-12-14 ENCOUNTER — Inpatient Hospital Stay: Payer: Medicare Other

## 2019-12-14 ENCOUNTER — Encounter: Payer: Self-pay | Admitting: Internal Medicine

## 2019-12-14 ENCOUNTER — Ambulatory Visit
Admission: RE | Admit: 2019-12-14 | Discharge: 2019-12-14 | Disposition: A | Discharge status: Home / Self Care | Payer: Medicare Other | Source: Ambulatory Visit | Attending: Radiation Oncology | Admitting: Radiation Oncology

## 2019-12-14 ENCOUNTER — Encounter: Payer: Self-pay | Admitting: *Deleted

## 2019-12-14 VITALS — BP 125/81 | HR 71 | Temp 98.2°F | Resp 18 | Ht 65.0 in | Wt 177.2 lb

## 2019-12-14 DIAGNOSIS — Z5111 Encounter for antineoplastic chemotherapy: Secondary | ICD-10-CM | POA: Diagnosis not present

## 2019-12-14 DIAGNOSIS — I10 Essential (primary) hypertension: Secondary | ICD-10-CM

## 2019-12-14 DIAGNOSIS — C3492 Malignant neoplasm of unspecified part of left bronchus or lung: Secondary | ICD-10-CM | POA: Diagnosis not present

## 2019-12-14 DIAGNOSIS — Z510 Encounter for antineoplastic radiation therapy: Secondary | ICD-10-CM | POA: Diagnosis not present

## 2019-12-14 LAB — CBC WITH DIFFERENTIAL (CANCER CENTER ONLY)
Abs Immature Granulocytes: 0.04 10*3/uL (ref 0.00–0.07)
Basophils Absolute: 0 10*3/uL (ref 0.0–0.1)
Basophils Relative: 1 %
Eosinophils Absolute: 0.1 10*3/uL (ref 0.0–0.5)
Eosinophils Relative: 3 %
HCT: 36.3 % (ref 36.0–46.0)
Hemoglobin: 12.4 g/dL (ref 12.0–15.0)
Immature Granulocytes: 1 %
Lymphocytes Relative: 10 %
Lymphs Abs: 0.5 10*3/uL — ABNORMAL LOW (ref 0.7–4.0)
MCH: 31.2 pg (ref 26.0–34.0)
MCHC: 34.2 g/dL (ref 30.0–36.0)
MCV: 91.2 fL (ref 80.0–100.0)
Monocytes Absolute: 0.5 10*3/uL (ref 0.1–1.0)
Monocytes Relative: 9 %
Neutro Abs: 4.1 10*3/uL (ref 1.7–7.7)
Neutrophils Relative %: 76 %
Platelet Count: 144 10*3/uL — ABNORMAL LOW (ref 150–400)
RBC: 3.98 MIL/uL (ref 3.87–5.11)
RDW: 12.9 % (ref 11.5–15.5)
WBC Count: 5.3 10*3/uL (ref 4.0–10.5)
nRBC: 0 % (ref 0.0–0.2)

## 2019-12-14 LAB — CMP (CANCER CENTER ONLY)
ALT: 15 U/L (ref 0–44)
AST: 13 U/L — ABNORMAL LOW (ref 15–41)
Albumin: 3.9 g/dL (ref 3.5–5.0)
Alkaline Phosphatase: 111 U/L (ref 38–126)
Anion gap: 6 (ref 5–15)
BUN: 9 mg/dL (ref 8–23)
CO2: 27 mmol/L (ref 22–32)
Calcium: 9.2 mg/dL (ref 8.9–10.3)
Chloride: 106 mmol/L (ref 98–111)
Creatinine: 0.65 mg/dL (ref 0.44–1.00)
GFR, Estimated: >60 mL/min (ref >60)
Glucose, Bld: 104 mg/dL — ABNORMAL HIGH (ref 70–99)
Potassium: 3.9 mmol/L (ref 3.5–5.1)
Sodium: 139 mmol/L (ref 135–145)
Total Bilirubin: 0.3 mg/dL (ref 0.3–1.2)
Total Protein: 6.5 g/dL (ref 6.5–8.1)

## 2019-12-14 MED ORDER — SODIUM CHLORIDE 0.9 % IV SOLN
Freq: Once | INTRAVENOUS | Status: AC
  Filled 2019-12-14: qty 250

## 2019-12-14 MED ORDER — PALONOSETRON HCL INJECTION 0.25 MG/5ML
INTRAVENOUS | Status: AC
  Filled 2019-12-14: qty 5

## 2019-12-14 MED ORDER — DIPHENHYDRAMINE HCL 50 MG/ML IJ SOLN
INTRAMUSCULAR | Status: AC
  Filled 2019-12-14: qty 1

## 2019-12-14 MED ORDER — FAMOTIDINE IN NACL 20-0.9 MG/50ML-% IV SOLN
20.0000 mg | Freq: Once | INTRAVENOUS | Status: AC
  Administered 2019-12-14: 20 mg via INTRAVENOUS

## 2019-12-14 MED ORDER — SODIUM CHLORIDE 0.9 % IV SOLN
20.0000 mg | Freq: Once | INTRAVENOUS | Status: AC
  Administered 2019-12-14: 20 mg via INTRAVENOUS
  Filled 2019-12-14: qty 20

## 2019-12-14 MED ORDER — DIPHENHYDRAMINE HCL 50 MG/ML IJ SOLN
50.0000 mg | Freq: Once | INTRAMUSCULAR | Status: AC
  Administered 2019-12-14: 50 mg via INTRAVENOUS

## 2019-12-14 MED ORDER — SODIUM CHLORIDE 0.9 % IV SOLN
186.6000 mg | Freq: Once | INTRAVENOUS | Status: AC
  Administered 2019-12-14: 190 mg via INTRAVENOUS
  Filled 2019-12-14: qty 19

## 2019-12-14 MED ORDER — PALONOSETRON HCL INJECTION 0.25 MG/5ML
0.2500 mg | Freq: Once | INTRAVENOUS | Status: AC
  Administered 2019-12-14: 0.25 mg via INTRAVENOUS

## 2019-12-14 MED ORDER — FAMOTIDINE IN NACL 20-0.9 MG/50ML-% IV SOLN
INTRAVENOUS | Status: AC
  Filled 2019-12-14: qty 50

## 2019-12-14 MED ORDER — SODIUM CHLORIDE 0.9 % IV SOLN
45.0000 mg/m2 | Freq: Once | INTRAVENOUS | Status: AC
  Administered 2019-12-14: 84 mg via INTRAVENOUS
  Filled 2019-12-14: qty 14

## 2019-12-14 NOTE — Progress Notes (Signed)
I followed up with Dr. Julien Nordmann regarding patient's molecular results.  He states her current treatment plan is correct and no need for any modifications at this time.

## 2019-12-14 NOTE — Progress Notes (Signed)
[place] Telephone:(336) [phone]   Fax:(336) [phone]  OFFICE PROGRESS NOTE  [name], MD [place] [address]  DIAGNOSIS: Stage IIIc (T3, N3, M0)non-small cell lung cancer, adenocarcinoma presented with large left upper lobe lung mass in addition to left infrahilar and right hilar and subcarinal lymphadenopathy diagnosed in August 2021.  Molecular Biomarkers: Insufficient tissue for foundation 1. Guardant 360 results.  DETECTED ALTERATION(S) / BIOMARKER(S) % CFDNA OR AMPLIFICATION ASSOCIATED FDA-APPROVED THERAPIES CLINICAL TRIAL AVAILABILITY  EGFR L858R, 1.2%, Afatinib, Dacomitinib, Erlotinib, Gefitinib, Osimertinib, Ramucirumab Yes EGFRL833V, 1.0%, Afatinib, Dacomitinib, Erlotinib, Gefitinib, Osimertinib, Ramucirumab Yes [id number] 1.0% None  No  PRIOR THERAPY: None  CURRENT THERAPY: Weekly concurrent chemoradiation with carboplatin for an AUC of 2 and paclitaxel 45 mg per metered squared.First dose expected on 11/16/2019. Status post 4 cycles.   INTERVAL HISTORY: Linda Woods 69 y.o. female returns to the clinic today for follow-up visit.  The patient is feeling fine today with no concerning complaints except for fatigue and mild odynophagia.  She denied having any chest pain, shortness of breath, cough or hemoptysis.  She denied having any fever or chills.  She has no nausea, vomiting, diarrhea or constipation.  She denied having any headache or visual changes.  She is here today for evaluation before starting cycle #5.  MEDICAL HISTORY: Past Medical History:  Diagnosis Date  . Anemia    as a child  . Anxiety   . Bursitis   . Chronic reflux esophagitis   . Complication of anesthesia   . Diarrhea, functional   . Diverticulosis   . GERD (gastroesophageal reflux disease)   . History of kidney stones   . Hypertension   . Knee pain    right knee-seeing ortho  . Osteoarthritis   . Plantar fasciitis   . PONV  (postoperative nausea and vomiting)    has used the patch before and that helps  . Pure hypercholesterolemia   . RLS (restless legs syndrome)   . Superficial vein thrombosis   . Thyroid disease    hypothyroid    ALLERGIES:  is allergic to crestor [rosuvastatin calcium] and statins.  MEDICATIONS:  Current Outpatient Medications  Medication Sig Dispense Refill  . cholecalciferol (VITAMIN D3) 25 MCG (1000 UNIT) tablet Take 1,000 Units by mouth daily. Pt states she take 2000 units/day    . diphenoxylate-atropine (LOMOTIL) 2.5-0.025 MG tablet Take 1 tablet by mouth daily as needed for diarrhea or loose stools.     . famotidine (PEPCID) 40 MG tablet Take 40 mg by mouth See admin instructions. Five times a week    . fexofenadine (ALLEGRA) 180 MG tablet Take 180 mg by mouth daily.    . fluticasone (FLONASE) 50 MCG/ACT nasal spray Place 2 sprays into both nostrils daily.    Marland Kitchen HYDROcodone-acetaminophen (NORCO/VICODIN) 5-325 MG tablet Take 0.5 tablets by mouth daily as needed (Knee pain).     Marland Kitchen HYDROcodone-homatropine (HYCODAN) 5-1.5 MG/5ML syrup Take 5 mLs by mouth daily as needed for cough.    Marland Kitchen ibuprofen (ADVIL) 200 MG tablet Take 400 mg by mouth every 6 (six) hours as needed (Knee pain).    . LORazepam (ATIVAN) 0.5 MG tablet Take 0.5 mg by mouth at bedtime.     . melatonin 5 MG TABS Take 5 mg by mouth at bedtime.    . metoprolol succinate (TOPROL XL) 50 MG 24 hr tablet Take 50 mg by mouth daily.     Marland Kitchen oxymetazoline (  AFRIN) 0.05 % nasal spray Place 1 spray into both nostrils daily as needed for congestion.    . prochlorperazine (COMPAZINE) 10 MG tablet Take 1 tablet (10 mg total) by mouth every 6 (six) hours as needed for nausea or vomiting. 30 tablet 0  . RABEprazole (ACIPHEX) 20 MG tablet Take 1 tablet (20 mg total) by mouth 2 (two) times a week.    . sucralfate (CARAFATE) 1 GM/10ML suspension Take 10 mLs (1 g total) by mouth 4 (four) times daily -  with meals and at bedtime. 420 mL 1  .  SYNTHROID 112 MCG tablet Take 112 mcg by mouth daily before breakfast.      No current facility-administered medications for this visit.    SURGICAL HISTORY:  Past Surgical History:  Procedure Laterality Date  . APPENDECTOMY    . BRONCHIAL BIOPSY  10/20/2019   Procedure: BRONCHIAL BIOPSIES;  Surgeon: Collene Gobble, MD;  Location: [facility] ENDOSCOPY;  Service: Pulmonary;;  . BRONCHIAL BRUSHINGS  10/20/2019   Procedure: BRONCHIAL BRUSHINGS;  Surgeon: Collene Gobble, MD;  Location: [facility] ENDOSCOPY;  Service: Pulmonary;;  . BRONCHIAL NEEDLE ASPIRATION BIOPSY  10/20/2019   Procedure: BRONCHIAL NEEDLE ASPIRATION BIOPSIES;  Surgeon: Collene Gobble, MD;  Location: [facility];  Service: Pulmonary;;  . BRONCHIAL WASHINGS  10/20/2019   Procedure: BRONCHIAL WASHINGS;  Surgeon: Collene Gobble, MD;  Location: [facility] ENDOSCOPY;  Service: Pulmonary;;  . CARPAL TUNNEL RELEASE Right 2011   Dr. Amedeo Plenty  . COLPORRHAPHY     posterior  . HAND SURGERY Right 2016   Nerve surgery, Dr. Amedeo Plenty  . OVARIAN CYST REMOVAL    . RECTOCELE REPAIR  2011   w/TVH and sling  . TONSILLECTOMY    . TONSILLECTOMY    . TOTAL KNEE ARTHROPLASTY Left 09/09/2018   Procedure: TOTAL KNEE ARTHROPLASTY, CORTISONE INJECTION RIGHT KNEE;  Surgeon: Paralee Cancel, MD;  Location: [facility] ORS;  Service: Orthopedics;  Laterality: Left;  70 mins  . TOTAL VAGINAL HYSTERECTOMY  10/18/2009   rectocele repair, sling  . TUBAL LIGATION Bilateral   . VARICOSE VEIN SURGERY    . VIDEO BRONCHOSCOPY WITH ENDOBRONCHIAL NAVIGATION N/A 10/20/2019   Procedure: VIDEO BRONCHOSCOPY WITH ENDOBRONCHIAL NAVIGATION;  Surgeon: Collene Gobble, MD;  Location: [facility] ENDOSCOPY;  Service: Pulmonary;  Laterality: N/A;    REVIEW OF SYSTEMS:  Constitutional: positive for fatigue Eyes: negative Ears, nose, mouth, throat, and face: negative Respiratory: negative Cardiovascular: negative Gastrointestinal: positive for odynophagia Genitourinary:negative Integument/breast:  negative Hematologic/lymphatic: negative Musculoskeletal:negative Neurological: negative Behavioral/Psych: negative Endocrine: negative Allergic/Immunologic: negative   PHYSICAL EXAMINATION: General appearance: alert, cooperative, fatigued and no distress Head: Normocephalic, without obvious abnormality, atraumatic Neck: no adenopathy, no JVD, supple, symmetrical, trachea midline and thyroid not enlarged, symmetric, no tenderness/mass/nodules Lymph nodes: Cervical, supraclavicular, and axillary nodes normal. Resp: clear to auscultation bilaterally Back: symmetric, no curvature. ROM normal. No CVA tenderness. Cardio: regular rate and rhythm, S1, S2 normal, no murmur, click, rub or gallop GI: soft, non-tender; bowel sounds normal; no masses,  no organomegaly Extremities: extremities normal, atraumatic, no cyanosis or edema Neurologic: Alert and oriented X 3, normal strength and tone. Normal symmetric reflexes. Normal coordination and gait  ECOG PERFORMANCE STATUS: 1 - Symptomatic but completely ambulatory  Blood pressure 125/81, pulse 71, temperature 98.2 F (36.8 C), temperature source Tympanic, resp. rate 18, height '5\' 5"'  (1.651 m), weight 177 lb 3.2 oz (80.4 kg), last menstrual period 08/21/2002, SpO2 100 %.  LABORATORY DATA: Lab Results  Component Value Date  WBC 5.3 12/14/2019   HGB 12.4 12/14/2019   HCT 36.3 12/14/2019   MCV 91.2 12/14/2019   PLT 144 (L) 12/14/2019      Chemistry      Component Value Date/Time   NA 139 12/14/2019 1040   K 3.9 12/14/2019 1040   CL 106 12/14/2019 1040   CO2 27 12/14/2019 1040   BUN 9 12/14/2019 1040   CREATININE 0.65 12/14/2019 1040      Component Value Date/Time   CALCIUM 9.2 12/14/2019 1040   ALKPHOS 111 12/14/2019 1040   AST 13 (L) 12/14/2019 1040   ALT 15 12/14/2019 1040   BILITOT 0.3 12/14/2019 1040       RADIOGRAPHIC STUDIES: DG Chest 2 View  Result Date: 11/20/2019 CLINICAL DATA:  Recent pneumonia. EXAM: CHEST - 2  VIEW COMPARISON:  Chest radiograph October 28, 2019; PET-CT November 02, 2019 FINDINGS: The mass noted on recent PET study in the left upper lobe anteriorly is again present with surrounding pneumonitis. Exact dimensions of the mass are difficult to ascertain given the surrounding infiltrate. Elsewhere lungs are clear. Heart size and pulmonary vascular normal. No adenopathy. No pneumothorax. No bone lesions. IMPRESSION: Anterior segment left upper lobe mass with surrounding pneumonitis persists, grossly stable. No pneumothorax. No new opacity. Heart size normal. No adenopathy appreciable by radiography. Electronically Signed   By: Lowella Grip III M.D.   On: 11/20/2019 09:56    ASSESSMENT AND PLAN: This is a very pleasant 69 years old white female recently diagnosed with a stage IIIc (T3, N3, M0) non-small cell lung cancer, adenocarcinoma presented with large left upper lobe lung mass in addition to left infrahilar, right hilar and subcarinal lymphadenopathy diagnosed in August 2021 with positive EGFR mutation in exon 21 (L858R). The patient is undergoing a course of concurrent chemoradiation with weekly carboplatin and paclitaxel status post 4 cycles.  She has been tolerating this treatment well except for the mild odynophagia. I discussed with the patient the molecular study results but this will be used for treatment in the future if needed. For the odynophagia, I recommended for her to continue her current treatment with Carafate. The patient will come back for follow-up visit in 2 weeks for evaluation before starting cycle #7. She was advised to call immediately if she has any concerning symptoms in the interval. The patient voices understanding of current disease status and treatment options and is in agreement with the current care plan.  All questions were answered. The patient knows to call the clinic with any problems, questions or concerns. We can certainly see the patient much sooner if  necessary.  The total time spent in the appointment was 30 minutes.  Disclaimer: This note was dictated with voice recognition software. Similar sounding words can inadvertently be transcribed and may not be corrected upon review.

## 2019-12-14 NOTE — Patient Instructions (Signed)
[facility] Discharge Instructions for Patients Receiving Chemotherapy  Today you received the following chemotherapy agents: Paclitaxel and Carboplatin  To help prevent nausea and vomiting after your treatment, we encourage you to take your nausea medication  as prescribed.    If you develop nausea and vomiting that is not controlled by your nausea medication, call the clinic.   BELOW ARE SYMPTOMS THAT SHOULD BE REPORTED IMMEDIATELY:  *FEVER GREATER THAN 100.5 F  *CHILLS WITH OR WITHOUT FEVER  NAUSEA AND VOMITING THAT IS NOT CONTROLLED WITH YOUR NAUSEA MEDICATION  *UNUSUAL SHORTNESS OF BREATH  *UNUSUAL BRUISING OR BLEEDING  TENDERNESS IN MOUTH AND THROAT WITH OR WITHOUT PRESENCE OF ULCERS  *URINARY PROBLEMS  *BOWEL PROBLEMS  UNUSUAL RASH Items with * indicate a potential emergency and should be followed up as soon as possible.  Feel free to call the clinic should you have any questions or concerns. The clinic phone number is [phone].  Please show the [facility] at check-in to the Emergency Department and triage nurse.

## 2019-12-15 ENCOUNTER — Ambulatory Visit
Admission: RE | Admit: 2019-12-15 | Discharge: 2019-12-15 | Disposition: A | Discharge status: Home / Self Care | Payer: Medicare Other | Source: Ambulatory Visit | Attending: Radiation Oncology | Admitting: Radiation Oncology

## 2019-12-15 DIAGNOSIS — Z510 Encounter for antineoplastic radiation therapy: Secondary | ICD-10-CM | POA: Diagnosis not present

## 2019-12-16 ENCOUNTER — Ambulatory Visit
Admission: RE | Admit: 2019-12-16 | Discharge: 2019-12-16 | Disposition: A | Discharge status: Home / Self Care | Payer: Medicare Other | Source: Ambulatory Visit | Attending: Radiation Oncology | Admitting: Radiation Oncology

## 2019-12-16 DIAGNOSIS — Z510 Encounter for antineoplastic radiation therapy: Secondary | ICD-10-CM | POA: Diagnosis not present

## 2019-12-17 ENCOUNTER — Ambulatory Visit
Admission: RE | Admit: 2019-12-17 | Discharge: 2019-12-17 | Disposition: A | Discharge status: Home / Self Care | Payer: Medicare Other | Source: Ambulatory Visit | Attending: Radiation Oncology | Admitting: Radiation Oncology

## 2019-12-17 DIAGNOSIS — Z510 Encounter for antineoplastic radiation therapy: Secondary | ICD-10-CM | POA: Diagnosis not present

## 2019-12-18 ENCOUNTER — Ambulatory Visit
Admission: RE | Admit: 2019-12-18 | Discharge: 2019-12-18 | Disposition: A | Discharge status: Home / Self Care | Payer: Medicare Other | Source: Ambulatory Visit | Attending: Radiation Oncology | Admitting: Radiation Oncology

## 2019-12-18 DIAGNOSIS — Z510 Encounter for antineoplastic radiation therapy: Secondary | ICD-10-CM | POA: Diagnosis not present

## 2019-12-21 ENCOUNTER — Encounter: Payer: Self-pay | Admitting: *Deleted

## 2019-12-21 ENCOUNTER — Ambulatory Visit
Admission: RE | Admit: 2019-12-21 | Discharge: 2019-12-21 | Disposition: A | Discharge status: Home / Self Care | Payer: Medicare Other | Source: Ambulatory Visit | Attending: Radiation Oncology | Admitting: Radiation Oncology

## 2019-12-21 ENCOUNTER — Other Ambulatory Visit: Payer: Self-pay | Admitting: Medical

## 2019-12-21 ENCOUNTER — Other Ambulatory Visit: Payer: Self-pay

## 2019-12-21 ENCOUNTER — Inpatient Hospital Stay: Payer: Medicare Other

## 2019-12-21 ENCOUNTER — Inpatient Hospital Stay: Payer: Medicare Other | Attending: Internal Medicine

## 2019-12-21 VITALS — BP 106/73 | HR 79 | Temp 97.9°F | Resp 16

## 2019-12-21 DIAGNOSIS — C3412 Malignant neoplasm of upper lobe, left bronchus or lung: Secondary | ICD-10-CM | POA: Insufficient documentation

## 2019-12-21 DIAGNOSIS — C3492 Malignant neoplasm of unspecified part of left bronchus or lung: Secondary | ICD-10-CM | POA: Diagnosis present

## 2019-12-21 DIAGNOSIS — Z5111 Encounter for antineoplastic chemotherapy: Secondary | ICD-10-CM | POA: Diagnosis not present

## 2019-12-21 DIAGNOSIS — Z510 Encounter for antineoplastic radiation therapy: Secondary | ICD-10-CM | POA: Insufficient documentation

## 2019-12-21 DIAGNOSIS — R1312 Dysphagia, oropharyngeal phase: Secondary | ICD-10-CM

## 2019-12-21 LAB — CBC WITH DIFFERENTIAL (CANCER CENTER ONLY)
Abs Immature Granulocytes: 0.02 10*3/uL (ref 0.00–0.07)
Basophils Absolute: 0 10*3/uL (ref 0.0–0.1)
Basophils Relative: 1 %
Eosinophils Absolute: 0.1 10*3/uL (ref 0.0–0.5)
Eosinophils Relative: 2 %
HCT: 34.5 % — ABNORMAL LOW (ref 36.0–46.0)
Hemoglobin: 11.9 g/dL — ABNORMAL LOW (ref 12.0–15.0)
Immature Granulocytes: 1 %
Lymphocytes Relative: 14 %
Lymphs Abs: 0.5 10*3/uL — ABNORMAL LOW (ref 0.7–4.0)
MCH: 32 pg (ref 26.0–34.0)
MCHC: 34.5 g/dL (ref 30.0–36.0)
MCV: 92.7 fL (ref 80.0–100.0)
Monocytes Absolute: 0.5 10*3/uL (ref 0.1–1.0)
Monocytes Relative: 13 %
Neutro Abs: 2.5 10*3/uL (ref 1.7–7.7)
Neutrophils Relative %: 69 %
Platelet Count: 113 10*3/uL — ABNORMAL LOW (ref 150–400)
RBC: 3.72 MIL/uL — ABNORMAL LOW (ref 3.87–5.11)
RDW: 13.6 % (ref 11.5–15.5)
WBC Count: 3.6 10*3/uL — ABNORMAL LOW (ref 4.0–10.5)
nRBC: 0 % (ref 0.0–0.2)

## 2019-12-21 LAB — CMP (CANCER CENTER ONLY)
ALT: 18 U/L (ref 0–44)
AST: 17 U/L (ref 15–41)
Albumin: 3.7 g/dL (ref 3.5–5.0)
Alkaline Phosphatase: 100 U/L (ref 38–126)
Anion gap: 7 (ref 5–15)
BUN: 11 mg/dL (ref 8–23)
CO2: 27 mmol/L (ref 22–32)
Calcium: 9.4 mg/dL (ref 8.9–10.3)
Chloride: 104 mmol/L (ref 98–111)
Creatinine: 0.71 mg/dL (ref 0.44–1.00)
GFR, Estimated: >60 mL/min (ref >60)
Glucose, Bld: 106 mg/dL — ABNORMAL HIGH (ref 70–99)
Potassium: 4.1 mmol/L (ref 3.5–5.1)
Sodium: 138 mmol/L (ref 135–145)
Total Bilirubin: 0.9 mg/dL (ref 0.3–1.2)
Total Protein: 6.6 g/dL (ref 6.5–8.1)

## 2019-12-21 MED ORDER — FAMOTIDINE IN NACL 20-0.9 MG/50ML-% IV SOLN
INTRAVENOUS | Status: AC
  Filled 2019-12-21: qty 50

## 2019-12-21 MED ORDER — FAMOTIDINE IN NACL 20-0.9 MG/50ML-% IV SOLN
20.0000 mg | Freq: Once | INTRAVENOUS | Status: AC
  Administered 2019-12-21: 20 mg via INTRAVENOUS

## 2019-12-21 MED ORDER — MAGIC MOUTHWASH: ORAL | 2 refills | Status: DC

## 2019-12-21 MED ORDER — PALONOSETRON HCL INJECTION 0.25 MG/5ML
INTRAVENOUS | Status: AC
  Filled 2019-12-21: qty 5

## 2019-12-21 MED ORDER — DIPHENHYDRAMINE HCL 50 MG/ML IJ SOLN
INTRAMUSCULAR | Status: AC
  Filled 2019-12-21: qty 1

## 2019-12-21 MED ORDER — LIDOCAINE VISCOUS HCL 2 % MT SOLN: OROMUCOSAL | 1 refills | Status: DC

## 2019-12-21 MED ORDER — PALONOSETRON HCL INJECTION 0.25 MG/5ML
0.2500 mg | Freq: Once | INTRAVENOUS | Status: AC
  Administered 2019-12-21: 0.25 mg via INTRAVENOUS

## 2019-12-21 MED ORDER — SODIUM CHLORIDE 0.9 % IV SOLN
Freq: Once | INTRAVENOUS | Status: AC
  Filled 2019-12-21: qty 250

## 2019-12-21 MED ORDER — DIPHENHYDRAMINE HCL 50 MG/ML IJ SOLN
50.0000 mg | Freq: Once | INTRAMUSCULAR | Status: AC
  Administered 2019-12-21: 50 mg via INTRAVENOUS

## 2019-12-21 MED ORDER — SODIUM CHLORIDE 0.9 % IV SOLN
20.0000 mg | Freq: Once | INTRAVENOUS | Status: AC
  Administered 2019-12-21: 20 mg via INTRAVENOUS
  Filled 2019-12-21: qty 20

## 2019-12-21 MED ORDER — SODIUM CHLORIDE 0.9 % IV SOLN
186.6000 mg | Freq: Once | INTRAVENOUS | Status: AC
  Administered 2019-12-21: 190 mg via INTRAVENOUS
  Filled 2019-12-21: qty 19

## 2019-12-21 MED ORDER — SODIUM CHLORIDE 0.9 % IV SOLN
45.0000 mg/m2 | Freq: Once | INTRAVENOUS | Status: AC
  Administered 2019-12-21: 84 mg via INTRAVENOUS
  Filled 2019-12-21: qty 14

## 2019-12-21 NOTE — Patient Instructions (Signed)
[facility] Discharge Instructions for Patients Receiving Chemotherapy  Today you received the following chemotherapy agents paclitaxel, carboplatin.  To help prevent nausea and vomiting after your treatment, we encourage you to take your nausea medication as directed.    If you develop nausea and vomiting that is not controlled by your nausea medication, call the clinic.   BELOW ARE SYMPTOMS THAT SHOULD BE REPORTED IMMEDIATELY:  *FEVER GREATER THAN 100.5 F  *CHILLS WITH OR WITHOUT FEVER  NAUSEA AND VOMITING THAT IS NOT CONTROLLED WITH YOUR NAUSEA MEDICATION  *UNUSUAL SHORTNESS OF BREATH  *UNUSUAL BRUISING OR BLEEDING  TENDERNESS IN MOUTH AND THROAT WITH OR WITHOUT PRESENCE OF ULCERS  *URINARY PROBLEMS  *BOWEL PROBLEMS  UNUSUAL RASH Items with * indicate a potential emergency and should be followed up as soon as possible.  Feel free to call the clinic should you have any questions or concerns. The clinic phone number is [phone].  Please show the [place] at check-in to the Emergency Department and triage nurse.

## 2019-12-21 NOTE — Progress Notes (Signed)
Patient presents with minor irritation to external angle left side of mouth, "white plaque on my tongue," and right-sided throat pain when swallowing. Verbalizes decreased oral intake as a result. Sandi Mealy, PA-C aware and will see patient in the infusion room.

## 2019-12-22 ENCOUNTER — Other Ambulatory Visit: Payer: Self-pay

## 2019-12-22 ENCOUNTER — Ambulatory Visit
Admission: RE | Admit: 2019-12-22 | Discharge: 2019-12-22 | Disposition: A | Discharge status: Home / Self Care | Payer: Medicare Other | Source: Ambulatory Visit | Attending: Radiation Oncology | Admitting: Radiation Oncology

## 2019-12-22 DIAGNOSIS — Z510 Encounter for antineoplastic radiation therapy: Secondary | ICD-10-CM | POA: Diagnosis not present

## 2019-12-23 ENCOUNTER — Ambulatory Visit
Admission: RE | Admit: 2019-12-23 | Discharge: 2019-12-23 | Disposition: A | Discharge status: Home / Self Care | Payer: Medicare Other | Source: Ambulatory Visit | Attending: Radiation Oncology | Admitting: Radiation Oncology

## 2019-12-23 DIAGNOSIS — Z510 Encounter for antineoplastic radiation therapy: Secondary | ICD-10-CM | POA: Diagnosis not present

## 2019-12-24 ENCOUNTER — Other Ambulatory Visit: Payer: Self-pay

## 2019-12-24 ENCOUNTER — Ambulatory Visit
Admission: RE | Admit: 2019-12-24 | Discharge: 2019-12-24 | Disposition: A | Discharge status: Home / Self Care | Payer: Medicare Other | Source: Ambulatory Visit | Attending: Radiation Oncology | Admitting: Radiation Oncology

## 2019-12-24 DIAGNOSIS — Z510 Encounter for antineoplastic radiation therapy: Secondary | ICD-10-CM | POA: Diagnosis not present

## 2019-12-25 ENCOUNTER — Ambulatory Visit
Admission: RE | Admit: 2019-12-25 | Discharge: 2019-12-25 | Disposition: A | Discharge status: Home / Self Care | Payer: Medicare Other | Source: Ambulatory Visit | Attending: Radiation Oncology | Admitting: Radiation Oncology

## 2019-12-25 DIAGNOSIS — Z510 Encounter for antineoplastic radiation therapy: Secondary | ICD-10-CM | POA: Diagnosis not present

## 2019-12-28 ENCOUNTER — Ambulatory Visit
Admission: RE | Admit: 2019-12-28 | Discharge: 2019-12-28 | Disposition: A | Discharge status: Home / Self Care | Payer: Medicare Other | Source: Ambulatory Visit | Attending: Radiation Oncology | Admitting: Radiation Oncology

## 2019-12-28 DIAGNOSIS — Z510 Encounter for antineoplastic radiation therapy: Secondary | ICD-10-CM | POA: Diagnosis not present

## 2019-12-29 ENCOUNTER — Inpatient Hospital Stay: Payer: Medicare Other

## 2019-12-29 ENCOUNTER — Encounter: Payer: Self-pay | Admitting: Internal Medicine

## 2019-12-29 ENCOUNTER — Other Ambulatory Visit: Payer: Self-pay | Admitting: Radiation Oncology

## 2019-12-29 ENCOUNTER — Ambulatory Visit
Admission: RE | Admit: 2019-12-29 | Discharge: 2019-12-29 | Disposition: A | Discharge status: Home / Self Care | Payer: Medicare Other | Source: Ambulatory Visit | Attending: Radiation Oncology | Admitting: Radiation Oncology

## 2019-12-29 ENCOUNTER — Inpatient Hospital Stay: Payer: Medicare Other | Admitting: Internal Medicine

## 2019-12-29 ENCOUNTER — Other Ambulatory Visit: Payer: Self-pay

## 2019-12-29 VITALS — BP 117/73 | HR 73 | Temp 98.0°F | Resp 18 | Ht 65.0 in | Wt 174.9 lb

## 2019-12-29 DIAGNOSIS — Z5111 Encounter for antineoplastic chemotherapy: Secondary | ICD-10-CM

## 2019-12-29 DIAGNOSIS — C3492 Malignant neoplasm of unspecified part of left bronchus or lung: Secondary | ICD-10-CM

## 2019-12-29 DIAGNOSIS — C3490 Malignant neoplasm of unspecified part of unspecified bronchus or lung: Secondary | ICD-10-CM | POA: Diagnosis not present

## 2019-12-29 DIAGNOSIS — Z510 Encounter for antineoplastic radiation therapy: Secondary | ICD-10-CM | POA: Diagnosis not present

## 2019-12-29 LAB — CBC WITH DIFFERENTIAL (CANCER CENTER ONLY)
Abs Immature Granulocytes: 0.03 10*3/uL (ref 0.00–0.07)
Basophils Absolute: 0 10*3/uL (ref 0.0–0.1)
Basophils Relative: 0 %
Eosinophils Absolute: 0.1 10*3/uL (ref 0.0–0.5)
Eosinophils Relative: 2 %
HCT: 30.6 % — ABNORMAL LOW (ref 36.0–46.0)
Hemoglobin: 10.7 g/dL — ABNORMAL LOW (ref 12.0–15.0)
Immature Granulocytes: 1 %
Lymphocytes Relative: 15 %
Lymphs Abs: 0.4 10*3/uL — ABNORMAL LOW (ref 0.7–4.0)
MCH: 32.3 pg (ref 26.0–34.0)
MCHC: 35 g/dL (ref 30.0–36.0)
MCV: 92.4 fL (ref 80.0–100.0)
Monocytes Absolute: 0.4 10*3/uL (ref 0.1–1.0)
Monocytes Relative: 13 %
Neutro Abs: 1.9 10*3/uL (ref 1.7–7.7)
Neutrophils Relative %: 69 %
Platelet Count: 170 10*3/uL (ref 150–400)
RBC: 3.31 MIL/uL — ABNORMAL LOW (ref 3.87–5.11)
RDW: 14.5 % (ref 11.5–15.5)
WBC Count: 2.8 10*3/uL — ABNORMAL LOW (ref 4.0–10.5)
nRBC: 0 % (ref 0.0–0.2)

## 2019-12-29 LAB — CMP (CANCER CENTER ONLY)
ALT: 13 U/L (ref 0–44)
AST: 13 U/L — ABNORMAL LOW (ref 15–41)
Albumin: 3.8 g/dL (ref 3.5–5.0)
Alkaline Phosphatase: 99 U/L (ref 38–126)
Anion gap: 6 (ref 5–15)
BUN: 8 mg/dL (ref 8–23)
CO2: 27 mmol/L (ref 22–32)
Calcium: 9 mg/dL (ref 8.9–10.3)
Chloride: 105 mmol/L (ref 98–111)
Creatinine: 0.69 mg/dL (ref 0.44–1.00)
GFR, Estimated: >60 mL/min (ref >60)
Glucose, Bld: 104 mg/dL — ABNORMAL HIGH (ref 70–99)
Potassium: 3.9 mmol/L (ref 3.5–5.1)
Sodium: 138 mmol/L (ref 135–145)
Total Bilirubin: 0.5 mg/dL (ref 0.3–1.2)
Total Protein: 6.6 g/dL (ref 6.5–8.1)

## 2019-12-29 MED ORDER — PALONOSETRON HCL INJECTION 0.25 MG/5ML
INTRAVENOUS | Status: AC
  Filled 2019-12-29: qty 5

## 2019-12-29 MED ORDER — DIPHENHYDRAMINE HCL 50 MG/ML IJ SOLN
50.0000 mg | Freq: Once | INTRAMUSCULAR | Status: AC
  Administered 2019-12-29: 50 mg via INTRAVENOUS

## 2019-12-29 MED ORDER — FAMOTIDINE IN NACL 20-0.9 MG/50ML-% IV SOLN
INTRAVENOUS | Status: AC
  Filled 2019-12-29: qty 50

## 2019-12-29 MED ORDER — PALONOSETRON HCL INJECTION 0.25 MG/5ML
0.2500 mg | Freq: Once | INTRAVENOUS | Status: AC
  Administered 2019-12-29: 0.25 mg via INTRAVENOUS

## 2019-12-29 MED ORDER — SODIUM CHLORIDE 0.9 % IV SOLN
Freq: Once | INTRAVENOUS | Status: AC
  Filled 2019-12-29: qty 250

## 2019-12-29 MED ORDER — FAMOTIDINE IN NACL 20-0.9 MG/50ML-% IV SOLN
20.0000 mg | Freq: Once | INTRAVENOUS | Status: AC
  Administered 2019-12-29: 20 mg via INTRAVENOUS

## 2019-12-29 MED ORDER — SUCRALFATE 1 G PO TABS: 1.0000 g | ORAL_TABLET | Freq: Three times a day (TID) | ORAL | 1 refills | Status: DC

## 2019-12-29 MED ORDER — SODIUM CHLORIDE 0.9 % IV SOLN
45.0000 mg/m2 | Freq: Once | INTRAVENOUS | Status: AC
  Administered 2019-12-29: 84 mg via INTRAVENOUS
  Filled 2019-12-29: qty 14

## 2019-12-29 MED ORDER — SODIUM CHLORIDE 0.9 % IV SOLN
20.0000 mg | Freq: Once | INTRAVENOUS | Status: AC
  Administered 2019-12-29: 20 mg via INTRAVENOUS
  Filled 2019-12-29: qty 20

## 2019-12-29 MED ORDER — DIPHENHYDRAMINE HCL 50 MG/ML IJ SOLN
INTRAMUSCULAR | Status: AC
  Filled 2019-12-29: qty 1

## 2019-12-29 MED ORDER — SODIUM CHLORIDE 0.9 % IV SOLN
186.6000 mg | Freq: Once | INTRAVENOUS | Status: AC
  Administered 2019-12-29: 190 mg via INTRAVENOUS
  Filled 2019-12-29: qty 19

## 2019-12-29 NOTE — Patient Instructions (Signed)
[facility] Discharge Instructions for Patients Receiving Chemotherapy  Today you received the following chemotherapy agents paclitaxel, carboplatin.  To help prevent nausea and vomiting after your treatment, we encourage you to take your nausea medication as directed.    If you develop nausea and vomiting that is not controlled by your nausea medication, call the clinic.   BELOW ARE SYMPTOMS THAT SHOULD BE REPORTED IMMEDIATELY:  *FEVER GREATER THAN 100.5 F  *CHILLS WITH OR WITHOUT FEVER  NAUSEA AND VOMITING THAT IS NOT CONTROLLED WITH YOUR NAUSEA MEDICATION  *UNUSUAL SHORTNESS OF BREATH  *UNUSUAL BRUISING OR BLEEDING  TENDERNESS IN MOUTH AND THROAT WITH OR WITHOUT PRESENCE OF ULCERS  *URINARY PROBLEMS  *BOWEL PROBLEMS  UNUSUAL RASH Items with * indicate a potential emergency and should be followed up as soon as possible.  Feel free to call the clinic should you have any questions or concerns. The clinic phone number is [phone].  Please show the [facility] at check-in to the Emergency Department and triage nurse.

## 2019-12-29 NOTE — Progress Notes (Signed)
[place] [phone]   [phone]  OFFICE PROGRESS NOTE  [name], MD [place] [address]  DIAGNOSIS: Stage IIIc (T3, N3, M0)non-small cell lung cancer, adenocarcinoma presented with large left upper lobe lung mass in addition to left infrahilar and right hilar and subcarinal lymphadenopathy diagnosed in August 2021.  Molecular Biomarkers: Insufficient tissue for foundation 1. Guardant 360 results.  DETECTED ALTERATION(S) / BIOMARKER(S) % CFDNA OR AMPLIFICATION ASSOCIATED FDA-APPROVED THERAPIES CLINICAL TRIAL AVAILABILITY  EGFR L858R, 1.2%, Afatinib, Dacomitinib, Erlotinib, Gefitinib, Osimertinib, Ramucirumab Yes EGFRL833V, 1.0%, Afatinib, Dacomitinib, Erlotinib, Gefitinib, Osimertinib, Ramucirumab Yes RHOAE40K 1.0% None  No  PRIOR THERAPY: None  CURRENT THERAPY: Weekly concurrent chemoradiation with carboplatin for an AUC of 2 and paclitaxel 45 mg per metered squared.First dose expected on 11/16/2019. Status post 6 cycles.   INTERVAL HISTORY: Linda Woods 69 y.o. female returns to the clinic today for follow-up visit.  The patient is feeling fine today with no concerning complaints except for fatigue and skin burn from the radiation on the left side of the anterior chest.  She denied having any chest pain, shortness of breath but has dry cough with no hemoptysis.  She denied having any nausea, vomiting, diarrhea or constipation.  She denied having any headache or visual changes.  She is here today for evaluation before starting cycle #7 of her treatment.  MEDICAL HISTORY: Past Medical History:  Diagnosis Date  . Anemia    as a child  . Anxiety   . Bursitis   . Chronic reflux esophagitis   . Complication of anesthesia   . Diarrhea, functional   . Diverticulosis   . GERD (gastroesophageal reflux disease)   . History of kidney stones   . Hypertension   . Knee pain    right knee-seeing ortho   . Osteoarthritis   . Plantar fasciitis   . PONV (postoperative nausea and vomiting)    has used the patch before and that helps  . Pure hypercholesterolemia   . RLS (restless legs syndrome)   . Superficial vein thrombosis   . Thyroid disease    hypothyroid    ALLERGIES:  is allergic to crestor [rosuvastatin calcium] and statins.  MEDICATIONS:  Current Outpatient Medications  Medication Sig Dispense Refill  . cholecalciferol (VITAMIN D3) 25 MCG (1000 UNIT) tablet Take 1,000 Units by mouth daily. Pt states she take 2000 units/day    . diphenoxylate-atropine (LOMOTIL) 2.5-0.025 MG tablet Take 1 tablet by mouth daily as needed for diarrhea or loose stools.     . famotidine (PEPCID) 40 MG tablet Take 40 mg by mouth See admin instructions. Five times a week    . fexofenadine (ALLEGRA) 180 MG tablet Take 180 mg by mouth daily.    . fluticasone (FLONASE) 50 MCG/ACT nasal spray Place 2 sprays into both nostrils daily.    Marland Kitchen HYDROcodone-acetaminophen (NORCO/VICODIN) 5-325 MG tablet Take 0.5 tablets by mouth daily as needed (Knee pain).     Marland Kitchen HYDROcodone-homatropine (HYCODAN) 5-1.5 MG/5ML syrup Take 5 mLs by mouth daily as needed for cough.    Marland Kitchen ibuprofen (ADVIL) 200 MG tablet Take 400 mg by mouth every 6 (six) hours as needed (Knee pain).    Marland Kitchen lidocaine (XYLOCAINE) 2 % solution 5 ml swish and spit or swallow Q 3 hours prn oral and esophageal pain 200 mL 1  . LORazepam (ATIVAN) 0.5 MG tablet Take 0.5 mg by mouth at bedtime.     Marland Kitchen  magic mouthwash SOLN 5 to 10 ml swish and spit or swallow QID prn mouth and esophageal pain 240 mL 2  . melatonin 5 MG TABS Take 5 mg by mouth at bedtime.    . metoprolol succinate (TOPROL XL) 50 MG 24 hr tablet Take 50 mg by mouth daily.     Marland Kitchen oxymetazoline (AFRIN) 0.05 % nasal spray Place 1 spray into both nostrils daily as needed for congestion.    . prochlorperazine (COMPAZINE) 10 MG tablet Take 1 tablet (10 mg total) by mouth every 6 (six) hours as needed for nausea  or vomiting. 30 tablet 0  . RABEprazole (ACIPHEX) 20 MG tablet Take 1 tablet (20 mg total) by mouth 2 (two) times a week.    . sucralfate (CARAFATE) 1 GM/10ML suspension Take 10 mLs (1 g total) by mouth 4 (four) times daily -  with meals and at bedtime. 420 mL 1  . SYNTHROID 112 MCG tablet Take 112 mcg by mouth daily before breakfast.     . vitamin B-12 (CYANOCOBALAMIN) 1000 MCG tablet Take 1,000 mcg by mouth daily.     No current facility-administered medications for this visit.    SURGICAL HISTORY:  Past Surgical History:  Procedure Laterality Date  . APPENDECTOMY    . BRONCHIAL BIOPSY  10/20/2019   Procedure: BRONCHIAL BIOPSIES;  Surgeon: Collene Gobble, MD;  Location: [facility] ENDOSCOPY;  Service: Pulmonary;;  . BRONCHIAL BRUSHINGS  10/20/2019   Procedure: BRONCHIAL BRUSHINGS;  Surgeon: Collene Gobble, MD;  Location: [facility] ENDOSCOPY;  Service: Pulmonary;;  . BRONCHIAL NEEDLE ASPIRATION BIOPSY  10/20/2019   Procedure: BRONCHIAL NEEDLE ASPIRATION BIOPSIES;  Surgeon: Collene Gobble, MD;  Location: [facility];  Service: Pulmonary;;  . BRONCHIAL WASHINGS  10/20/2019   Procedure: BRONCHIAL WASHINGS;  Surgeon: Collene Gobble, MD;  Location: [facility] ENDOSCOPY;  Service: Pulmonary;;  . CARPAL TUNNEL RELEASE Right 2011   Dr. Amedeo Plenty  . COLPORRHAPHY     posterior  . HAND SURGERY Right 2016   Nerve surgery, Dr. Amedeo Plenty  . OVARIAN CYST REMOVAL    . RECTOCELE REPAIR  2011   w/TVH and sling  . TONSILLECTOMY    . TONSILLECTOMY    . TOTAL KNEE ARTHROPLASTY Left 09/09/2018   Procedure: TOTAL KNEE ARTHROPLASTY, CORTISONE INJECTION RIGHT KNEE;  Surgeon: Paralee Cancel, MD;  Location: [facility] ORS;  Service: Orthopedics;  Laterality: Left;  70 mins  . TOTAL VAGINAL HYSTERECTOMY  10/18/2009   rectocele repair, sling  . TUBAL LIGATION Bilateral   . VARICOSE VEIN SURGERY    . VIDEO BRONCHOSCOPY WITH ENDOBRONCHIAL NAVIGATION N/A 10/20/2019   Procedure: VIDEO BRONCHOSCOPY WITH ENDOBRONCHIAL NAVIGATION;  Surgeon: Collene Gobble, MD;  Location: [facility] ENDOSCOPY;  Service: Pulmonary;  Laterality: N/A;    REVIEW OF SYSTEMS:  A comprehensive review of systems was negative except for: Constitutional: positive for fatigue Integument/breast: positive for rash and skin color change   PHYSICAL EXAMINATION: General appearance: alert, cooperative, fatigued and no distress Head: Normocephalic, without obvious abnormality, atraumatic Neck: no adenopathy, no JVD, supple, symmetrical, trachea midline and thyroid not enlarged, symmetric, no tenderness/mass/nodules Lymph nodes: Cervical, supraclavicular, and axillary nodes normal. Resp: clear to auscultation bilaterally Back: symmetric, no curvature. ROM normal. No CVA tenderness. Cardio: regular rate and rhythm, S1, S2 normal, no murmur, click, rub or gallop GI: soft, non-tender; bowel sounds normal; no masses,  no organomegaly Extremities: extremities normal, atraumatic, no cyanosis or edema  ECOG PERFORMANCE STATUS: 1 - Symptomatic but completely ambulatory  Blood pressure  117/73, pulse 73, temperature 98 F (36.7 C), temperature source Tympanic, resp. rate 18, height 5' 5" (1.651 m), weight 174 lb 14.4 oz (79.3 kg), last menstrual period 08/21/2002, SpO2 100 %.  LABORATORY DATA: Lab Results  Component Value Date   WBC 2.8 (L) 12/29/2019   HGB 10.7 (L) 12/29/2019   HCT 30.6 (L) 12/29/2019   MCV 92.4 12/29/2019   PLT 170 12/29/2019      Chemistry      Component Value Date/Time   NA 138 12/29/2019 0846   K 3.9 12/29/2019 0846   CL 105 12/29/2019 0846   CO2 27 12/29/2019 0846   BUN 8 12/29/2019 0846   CREATININE 0.69 12/29/2019 0846      Component Value Date/Time   CALCIUM 9.0 12/29/2019 0846   ALKPHOS 99 12/29/2019 0846   AST 13 (L) 12/29/2019 0846   ALT 13 12/29/2019 0846   BILITOT 0.5 12/29/2019 0846       RADIOGRAPHIC STUDIES: No results found.  ASSESSMENT AND PLAN: This is a very pleasant 68 years old white female recently diagnosed with a  stage IIIc (T3, N3, M0) non-small cell lung cancer, adenocarcinoma presented with large left upper lobe lung mass in addition to left infrahilar, right hilar and subcarinal lymphadenopathy diagnosed in August 2021 with positive EGFR mutation in exon 21 (L858R). The patient is undergoing a course of concurrent chemoradiation with weekly carboplatin and paclitaxel status post 6 cycles.  The patient has been tolerating this treatment well except for mild odynophagia in addition to fatigue and skin burns from the radiation. I recommended for her to proceed with cycle #7 today as planned. She will complete the course of concurrent chemoradiation on January 06, 2020. I will see the patient back for follow-up visit in around 1 month with repeat CT scan of the chest for restaging of her disease. The patient was advised to call immediately if she has any concerning symptoms in the interval. The patient voices understanding of current disease status and treatment options and is in agreement with the current care plan.  All questions were answered. The patient knows to call the clinic with any problems, questions or concerns. We can certainly see the patient much sooner if necessary.  Disclaimer: This note was dictated with voice recognition software. Similar sounding words can inadvertently be transcribed and may not be corrected upon review.       

## 2019-12-30 ENCOUNTER — Ambulatory Visit
Admission: RE | Admit: 2019-12-30 | Discharge: 2019-12-30 | Disposition: A | Discharge status: Home / Self Care | Payer: Medicare Other | Source: Ambulatory Visit | Attending: Radiation Oncology | Admitting: Radiation Oncology

## 2019-12-30 DIAGNOSIS — Z510 Encounter for antineoplastic radiation therapy: Secondary | ICD-10-CM | POA: Diagnosis not present

## 2019-12-31 ENCOUNTER — Ambulatory Visit
Admission: RE | Admit: 2019-12-31 | Discharge: 2019-12-31 | Disposition: A | Discharge status: Home / Self Care | Payer: Medicare Other | Source: Ambulatory Visit | Attending: Radiation Oncology | Admitting: Radiation Oncology

## 2019-12-31 DIAGNOSIS — Z510 Encounter for antineoplastic radiation therapy: Secondary | ICD-10-CM | POA: Diagnosis not present

## 2020-01-01 ENCOUNTER — Ambulatory Visit
Admission: RE | Admit: 2020-01-01 | Discharge: 2020-01-01 | Disposition: A | Discharge status: Home / Self Care | Payer: Medicare Other | Source: Ambulatory Visit | Attending: Radiation Oncology | Admitting: Radiation Oncology

## 2020-01-01 DIAGNOSIS — C3492 Malignant neoplasm of unspecified part of left bronchus or lung: Secondary | ICD-10-CM

## 2020-01-01 DIAGNOSIS — Z510 Encounter for antineoplastic radiation therapy: Secondary | ICD-10-CM | POA: Diagnosis not present

## 2020-01-01 MED ORDER — SONAFINE EX EMUL
1.0000 "application " | Freq: Two times a day (BID) | CUTANEOUS | Status: DC
  Administered 2020-01-01: 1 via TOPICAL

## 2020-01-04 ENCOUNTER — Inpatient Hospital Stay: Payer: Medicare Other

## 2020-01-04 ENCOUNTER — Ambulatory Visit
Admission: RE | Admit: 2020-01-04 | Discharge: 2020-01-04 | Disposition: A | Discharge status: Home / Self Care | Payer: Medicare Other | Source: Ambulatory Visit | Attending: Radiation Oncology | Admitting: Radiation Oncology

## 2020-01-04 ENCOUNTER — Other Ambulatory Visit: Payer: Self-pay

## 2020-01-04 ENCOUNTER — Other Ambulatory Visit: Payer: Self-pay | Admitting: *Deleted

## 2020-01-04 VITALS — BP 118/81 | HR 86 | Temp 98.2°F | Resp 18

## 2020-01-04 DIAGNOSIS — C3492 Malignant neoplasm of unspecified part of left bronchus or lung: Secondary | ICD-10-CM

## 2020-01-04 DIAGNOSIS — Z510 Encounter for antineoplastic radiation therapy: Secondary | ICD-10-CM | POA: Diagnosis not present

## 2020-01-04 DIAGNOSIS — Z5111 Encounter for antineoplastic chemotherapy: Secondary | ICD-10-CM | POA: Diagnosis not present

## 2020-01-04 LAB — COMPREHENSIVE METABOLIC PANEL
ALT: 13 U/L (ref 0–44)
AST: 13 U/L — ABNORMAL LOW (ref 15–41)
Albumin: 3.6 g/dL (ref 3.5–5.0)
Alkaline Phosphatase: 98 U/L (ref 38–126)
Anion gap: 9 (ref 5–15)
BUN: 8 mg/dL (ref 8–23)
CO2: 23 mmol/L (ref 22–32)
Calcium: 8.9 mg/dL (ref 8.9–10.3)
Chloride: 106 mmol/L (ref 98–111)
Creatinine, Ser: 0.67 mg/dL (ref 0.44–1.00)
GFR, Estimated: >60 mL/min (ref >60)
Glucose, Bld: 103 mg/dL — ABNORMAL HIGH (ref 70–99)
Potassium: 4.1 mmol/L (ref 3.5–5.1)
Sodium: 138 mmol/L (ref 135–145)
Total Bilirubin: 0.5 mg/dL (ref 0.3–1.2)
Total Protein: 7.1 g/dL (ref 6.5–8.1)

## 2020-01-04 LAB — CBC WITH DIFFERENTIAL/PLATELET
Abs Immature Granulocytes: 0.02 10*3/uL (ref 0.00–0.07)
Basophils Absolute: 0 10*3/uL (ref 0.0–0.1)
Basophils Relative: 1 %
Eosinophils Absolute: 0 10*3/uL (ref 0.0–0.5)
Eosinophils Relative: 1 %
HCT: 30.9 % — ABNORMAL LOW (ref 36.0–46.0)
Hemoglobin: 10.7 g/dL — ABNORMAL LOW (ref 12.0–15.0)
Immature Granulocytes: 1 %
Lymphocytes Relative: 19 %
Lymphs Abs: 0.5 10*3/uL — ABNORMAL LOW (ref 0.7–4.0)
MCH: 32.1 pg (ref 26.0–34.0)
MCHC: 34.6 g/dL (ref 30.0–36.0)
MCV: 92.8 fL (ref 80.0–100.0)
Monocytes Absolute: 0.4 10*3/uL (ref 0.1–1.0)
Monocytes Relative: 13 %
Neutro Abs: 1.9 10*3/uL (ref 1.7–7.7)
Neutrophils Relative %: 65 %
Platelets: 171 10*3/uL (ref 150–400)
RBC: 3.33 MIL/uL — ABNORMAL LOW (ref 3.87–5.11)
RDW: 14.6 % (ref 11.5–15.5)
WBC: 2.9 10*3/uL — ABNORMAL LOW (ref 4.0–10.5)
nRBC: 0 % (ref 0.0–0.2)

## 2020-01-04 MED ORDER — SODIUM CHLORIDE 0.9 % IV SOLN
186.6000 mg | Freq: Once | INTRAVENOUS | Status: AC
  Administered 2020-01-04: 190 mg via INTRAVENOUS
  Filled 2020-01-04: qty 19

## 2020-01-04 MED ORDER — DIPHENHYDRAMINE HCL 50 MG/ML IJ SOLN
50.0000 mg | Freq: Once | INTRAMUSCULAR | Status: AC
  Administered 2020-01-04: 50 mg via INTRAVENOUS

## 2020-01-04 MED ORDER — PALONOSETRON HCL INJECTION 0.25 MG/5ML
0.2500 mg | Freq: Once | INTRAVENOUS | Status: AC
  Administered 2020-01-04: 0.25 mg via INTRAVENOUS

## 2020-01-04 MED ORDER — FAMOTIDINE IN NACL 20-0.9 MG/50ML-% IV SOLN
20.0000 mg | Freq: Once | INTRAVENOUS | Status: AC
  Administered 2020-01-04: 20 mg via INTRAVENOUS

## 2020-01-04 MED ORDER — SODIUM CHLORIDE 0.9 % IV SOLN
Freq: Once | INTRAVENOUS | Status: AC
  Filled 2020-01-04: qty 250

## 2020-01-04 MED ORDER — SODIUM CHLORIDE 0.9 % IV SOLN
20.0000 mg | Freq: Once | INTRAVENOUS | Status: AC
  Administered 2020-01-04: 20 mg via INTRAVENOUS
  Filled 2020-01-04: qty 20

## 2020-01-04 MED ORDER — SODIUM CHLORIDE 0.9 % IV SOLN
45.0000 mg/m2 | Freq: Once | INTRAVENOUS | Status: AC
  Administered 2020-01-04: 84 mg via INTRAVENOUS
  Filled 2020-01-04: qty 14

## 2020-01-04 MED ORDER — FAMOTIDINE IN NACL 20-0.9 MG/50ML-% IV SOLN
INTRAVENOUS | Status: AC
  Filled 2020-01-04: qty 50

## 2020-01-04 MED ORDER — PALONOSETRON HCL INJECTION 0.25 MG/5ML
INTRAVENOUS | Status: AC
  Filled 2020-01-04: qty 5

## 2020-01-04 MED ORDER — DIPHENHYDRAMINE HCL 50 MG/ML IJ SOLN
INTRAMUSCULAR | Status: AC
  Filled 2020-01-04: qty 1

## 2020-01-04 NOTE — Patient Instructions (Signed)
[facility] Discharge Instructions for Patients Receiving Chemotherapy  Today you received the following chemotherapy agents: taxol, carboplatin   To help prevent nausea and vomiting after your treatment, we encourage you to take your nausea medication as directed    If you develop nausea and vomiting that is not controlled by your nausea medication, call the clinic.   BELOW ARE SYMPTOMS THAT SHOULD BE REPORTED IMMEDIATELY:  *FEVER GREATER THAN 100.5 F  *CHILLS WITH OR WITHOUT FEVER  NAUSEA AND VOMITING THAT IS NOT CONTROLLED WITH YOUR NAUSEA MEDICATION  *UNUSUAL SHORTNESS OF BREATH  *UNUSUAL BRUISING OR BLEEDING  TENDERNESS IN MOUTH AND THROAT WITH OR WITHOUT PRESENCE OF ULCERS  *URINARY PROBLEMS  *BOWEL PROBLEMS  UNUSUAL RASH Items with * indicate a potential emergency and should be followed up as soon as possible.  Feel free to call the clinic should you have any questions or concerns. The clinic phone number is [phone].  Please show the [facility] [place] [facility] at check-in to the Emergency Department and triage nurse.

## 2020-01-05 ENCOUNTER — Ambulatory Visit
Admission: RE | Admit: 2020-01-05 | Discharge: 2020-01-05 | Disposition: A | Discharge status: Home / Self Care | Payer: Medicare Other | Source: Ambulatory Visit | Attending: Radiation Oncology | Admitting: Radiation Oncology

## 2020-01-05 ENCOUNTER — Other Ambulatory Visit: Payer: Self-pay | Admitting: Radiation Oncology

## 2020-01-05 DIAGNOSIS — Z510 Encounter for antineoplastic radiation therapy: Secondary | ICD-10-CM | POA: Diagnosis not present

## 2020-01-05 MED ORDER — OXYCODONE-ACETAMINOPHEN 5-325 MG PO TABS
1.0000 | ORAL_TABLET | ORAL | 0 refills | Status: DC | PRN
Start: 2020-01-05 — End: 2020-01-11

## 2020-01-06 ENCOUNTER — Ambulatory Visit
Admission: RE | Admit: 2020-01-06 | Discharge: 2020-01-06 | Disposition: A | Discharge status: Home / Self Care | Payer: Medicare Other | Source: Ambulatory Visit | Attending: Radiation Oncology | Admitting: Radiation Oncology

## 2020-01-06 ENCOUNTER — Encounter: Payer: Self-pay | Admitting: Radiation Oncology

## 2020-01-06 DIAGNOSIS — Z510 Encounter for antineoplastic radiation therapy: Secondary | ICD-10-CM | POA: Diagnosis not present

## 2020-01-08 ENCOUNTER — Other Ambulatory Visit: Payer: Self-pay | Admitting: Medical Oncology

## 2020-01-08 DIAGNOSIS — K1230 Oral mucositis (ulcerative), unspecified: Secondary | ICD-10-CM

## 2020-01-08 MED ORDER — MAGIC MOUTHWASH: ORAL | 2 refills | Status: DC

## 2020-01-08 NOTE — Telephone Encounter (Signed)
Mucositis- [facility] ,is recommending  a mouthwash with the following components: 1:1:1:1- carafate sups pension, benadryl , maalox and lidocaine. Order sent to [facility].

## 2020-01-11 ENCOUNTER — Other Ambulatory Visit: Payer: Self-pay | Admitting: Medical

## 2020-01-11 ENCOUNTER — Telehealth: Payer: Self-pay

## 2020-01-11 ENCOUNTER — Other Ambulatory Visit: Payer: Self-pay | Admitting: Radiation Oncology

## 2020-01-11 DIAGNOSIS — R1312 Dysphagia, oropharyngeal phase: Secondary | ICD-10-CM

## 2020-01-11 MED ORDER — OXYCODONE-ACETAMINOPHEN 5-325 MG PO TABS
1.0000 | ORAL_TABLET | ORAL | 0 refills | Status: DC | PRN
Start: 2020-01-11 — End: 2021-08-07

## 2020-01-11 MED ORDER — LIDOCAINE VISCOUS HCL 2 % MT SOLN: OROMUCOSAL | 2 refills | Status: DC

## 2020-01-11 NOTE — Telephone Encounter (Signed)
Lucianne Lei, Not sure if she will need this refilled. Gardiner Rhyme, RN

## 2020-01-11 NOTE — Telephone Encounter (Signed)
Returned patient's call at 1209 and 315pm today as she had questions about the ongoing drainage from the left side of her neck. She reports she is having a lot of pain but only taking a 1/2 tablet of oxycodone. Patient advised after talking with Dr. Sondra Come that he will send in more tablets to her drug store for pain. She was advised to use Neosporin on her neck and can use telfa or gauze to absorb the drainage. He husband ordered mor hydrogels for her neck that are soothing. Patient verbalized understanding.

## 2020-01-26 ENCOUNTER — Encounter (HOSPITAL_COMMUNITY): Payer: Self-pay

## 2020-01-26 ENCOUNTER — Inpatient Hospital Stay: Payer: Medicare Other | Attending: Internal Medicine

## 2020-01-26 ENCOUNTER — Other Ambulatory Visit: Payer: Self-pay

## 2020-01-26 ENCOUNTER — Ambulatory Visit (HOSPITAL_COMMUNITY)
Admission: RE | Admit: 2020-01-26 | Discharge: 2020-01-26 | Disposition: A | Discharge status: Home / Self Care | Payer: Medicare Other | Source: Ambulatory Visit | Attending: Internal Medicine | Admitting: Internal Medicine

## 2020-01-26 DIAGNOSIS — C3412 Malignant neoplasm of upper lobe, left bronchus or lung: Secondary | ICD-10-CM | POA: Diagnosis present

## 2020-01-26 DIAGNOSIS — C3490 Malignant neoplasm of unspecified part of unspecified bronchus or lung: Secondary | ICD-10-CM | POA: Diagnosis not present

## 2020-01-26 DIAGNOSIS — R5383 Other fatigue: Secondary | ICD-10-CM | POA: Diagnosis not present

## 2020-01-26 LAB — CMP (CANCER CENTER ONLY)
ALT: 11 U/L (ref 0–44)
AST: 17 U/L (ref 15–41)
Albumin: 3.4 g/dL — ABNORMAL LOW (ref 3.5–5.0)
Alkaline Phosphatase: 108 U/L (ref 38–126)
Anion gap: 6 (ref 5–15)
BUN: 6 mg/dL — ABNORMAL LOW (ref 8–23)
CO2: 27 mmol/L (ref 22–32)
Calcium: 9.6 mg/dL (ref 8.9–10.3)
Chloride: 107 mmol/L (ref 98–111)
Creatinine: 0.77 mg/dL (ref 0.44–1.00)
GFR, Estimated: >60 mL/min (ref >60)
Glucose, Bld: 112 mg/dL — ABNORMAL HIGH (ref 70–99)
Potassium: 4.1 mmol/L (ref 3.5–5.1)
Sodium: 140 mmol/L (ref 135–145)
Total Bilirubin: 0.5 mg/dL (ref 0.3–1.2)
Total Protein: 7.4 g/dL (ref 6.5–8.1)

## 2020-01-26 LAB — CBC WITH DIFFERENTIAL (CANCER CENTER ONLY)
Abs Immature Granulocytes: 0.03 10*3/uL (ref 0.00–0.07)
Basophils Absolute: 0 10*3/uL (ref 0.0–0.1)
Basophils Relative: 1 %
Eosinophils Absolute: 0.2 10*3/uL (ref 0.0–0.5)
Eosinophils Relative: 4 %
HCT: 26.6 % — ABNORMAL LOW (ref 36.0–46.0)
Hemoglobin: 8.8 g/dL — ABNORMAL LOW (ref 12.0–15.0)
Immature Granulocytes: 1 %
Lymphocytes Relative: 18 %
Lymphs Abs: 0.7 10*3/uL (ref 0.7–4.0)
MCH: 31.9 pg (ref 26.0–34.0)
MCHC: 33.1 g/dL (ref 30.0–36.0)
MCV: 96.4 fL (ref 80.0–100.0)
Monocytes Absolute: 0.6 10*3/uL (ref 0.1–1.0)
Monocytes Relative: 17 %
Neutro Abs: 2.3 10*3/uL (ref 1.7–7.7)
Neutrophils Relative %: 59 %
Platelet Count: 209 10*3/uL (ref 150–400)
RBC: 2.76 MIL/uL — ABNORMAL LOW (ref 3.87–5.11)
RDW: 16.4 % — ABNORMAL HIGH (ref 11.5–15.5)
WBC Count: 3.8 10*3/uL — ABNORMAL LOW (ref 4.0–10.5)
nRBC: 0 % (ref 0.0–0.2)

## 2020-01-26 MED ORDER — IOHEXOL 300 MG/ML  SOLN
75.0000 mL | Freq: Once | INTRAMUSCULAR | Status: AC | PRN
  Administered 2020-01-26: 75 mL via INTRAVENOUS

## 2020-01-26 MED ORDER — SODIUM CHLORIDE (PF) 0.9 % IJ SOLN
INTRAMUSCULAR | Status: AC
  Filled 2020-01-26: qty 50

## 2020-01-28 ENCOUNTER — Telehealth: Payer: Self-pay

## 2020-01-28 ENCOUNTER — Encounter: Payer: Self-pay | Admitting: Internal Medicine

## 2020-01-28 ENCOUNTER — Inpatient Hospital Stay (HOSPITAL_BASED_OUTPATIENT_CLINIC_OR_DEPARTMENT_OTHER): Payer: Medicare Other | Admitting: Internal Medicine

## 2020-01-28 ENCOUNTER — Other Ambulatory Visit: Payer: Self-pay | Admitting: Medical Oncology

## 2020-01-28 ENCOUNTER — Other Ambulatory Visit: Payer: Self-pay

## 2020-01-28 ENCOUNTER — Telehealth: Payer: Self-pay | Admitting: Pharmacist

## 2020-01-28 VITALS — BP 124/71 | HR 89 | Temp 97.2°F | Resp 18 | Ht 65.0 in | Wt 170.5 lb

## 2020-01-28 DIAGNOSIS — Z7189 Other specified counseling: Secondary | ICD-10-CM | POA: Diagnosis not present

## 2020-01-28 DIAGNOSIS — Z5111 Encounter for antineoplastic chemotherapy: Secondary | ICD-10-CM

## 2020-01-28 DIAGNOSIS — C3492 Malignant neoplasm of unspecified part of left bronchus or lung: Secondary | ICD-10-CM

## 2020-01-28 DIAGNOSIS — C3412 Malignant neoplasm of upper lobe, left bronchus or lung: Secondary | ICD-10-CM | POA: Diagnosis not present

## 2020-01-28 MED ORDER — OSIMERTINIB MESYLATE 80 MG PO TABS: 80.0000 mg | ORAL_TABLET | Freq: Every day | ORAL | 2 refills | Status: DC

## 2020-01-28 NOTE — Telephone Encounter (Signed)
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  Prescription dose and frequency assessed for appropriateness. Appropriate for therapy initiation.   CBC w/ Diff and CMP from 01/26/20 assessed, noted Hgb 8.8 g/dL (down from 10.7 g/dL) - all other labs stable. Dose OK for treatment initiation.  Current medication list in Epic reviewed, no significant/relevant DDIs with Tagrisso identified.  Evaluated chart and no patient barriers to medication adherence noted.   Prescription has been e-scribed to the [facility] for benefits analysis and approval.  Oral Oncology Clinic will continue to follow for insurance authorization, copayment issues, initial counseling and start date.  Leron Croak, PharmD, BCPS Hematology/Oncology Clinical Pharmacist [facility] [phone] 01/28/2020 1:27 PM

## 2020-01-28 NOTE — Progress Notes (Signed)
[facility] [phone]   [phone]  OFFICE PROGRESS NOTE  [name], MD [place] [address]  DIAGNOSIS: Stage IIIc (T3, N3, M0)non-small cell lung cancer, adenocarcinoma presented with large left upper lobe lung mass in addition to left infrahilar and right hilar and subcarinal lymphadenopathy diagnosed in August 2021.  Molecular Biomarkers: Insufficient tissue for foundation 1. Guardant 360 results.  DETECTED ALTERATION(S) / BIOMARKER(S) % CFDNA OR AMPLIFICATION ASSOCIATED FDA-APPROVED THERAPIES CLINICAL TRIAL AVAILABILITY  EGFR L858R, 1.2%, Afatinib, Dacomitinib, Erlotinib, Gefitinib, Osimertinib, Ramucirumab Yes [id number], 1.0%, Afatinib, Dacomitinib, Erlotinib, Gefitinib, Osimertinib, Ramucirumab Yes [id number] 1.0% None  No  PRIOR THERAPY: Weekly concurrent chemoradiation with carboplatin for an AUC of 2 and paclitaxel 45 mg/m2.First dose expected on 11/16/2019. Status post 7 cycles.  CURRENT THERAPY:   Tagrisso 80 mg p.o. daily.  Expected to start in the next few days.  INTERVAL HISTORY: Linda Woods 69 y.o. female returns to the clinic today for follow-up visit accompanied by her husband.  The patient is feeling fine today with no concerning complaints except for fatigue.  She denied having any current chest pain but has shortness of breath with exertion with mild cough and no hemoptysis.  She denied having any fever or chills.  She has no nausea, vomiting, diarrhea or constipation.  She denied having any headache or visual changes.  She has no weight loss or night sweats.  She tolerated the previous course of concurrent chemoradiation fairly well except for the fatigue as well as a skin burn especially in the neck area.  The patient had repeat CT scan of the chest performed recently and she is here for evaluation and discussion of her risk her results.  MEDICAL HISTORY: Past Medical History:   Diagnosis Date  . Anemia    as a child  . Anxiety   . Bursitis   . Chronic reflux esophagitis   . Complication of anesthesia   . Diarrhea, functional   . Diverticulosis   . GERD (gastroesophageal reflux disease)   . History of kidney stones   . Hypertension   . Knee pain    right knee-seeing ortho  . Osteoarthritis   . Plantar fasciitis   . PONV (postoperative nausea and vomiting)    has used the patch before and that helps  . Pure hypercholesterolemia   . RLS (restless legs syndrome)   . Superficial vein thrombosis   . Thyroid disease    hypothyroid    ALLERGIES:  is allergic to crestor [rosuvastatin calcium] and statins.  MEDICATIONS:  Current Outpatient Medications  Medication Sig Dispense Refill  . cholecalciferol (VITAMIN D3) 25 MCG (1000 UNIT) tablet Take 1,000 Units by mouth daily. Pt states she take 2000 units/day    . diphenoxylate-atropine (LOMOTIL) 2.5-0.025 MG tablet Take 1 tablet by mouth daily as needed for diarrhea or loose stools.     . famotidine (PEPCID) 40 MG tablet Take 40 mg by mouth See admin instructions. Five times a week    . fexofenadine (ALLEGRA) 180 MG tablet Take 180 mg by mouth daily.    . fluticasone (FLONASE) 50 MCG/ACT nasal spray Place 2 sprays into both nostrils daily.    Marland Kitchen HYDROcodone-acetaminophen (NORCO/VICODIN) 5-325 MG tablet Take 0.5 tablets by mouth daily as needed (Knee pain).     Marland Kitchen HYDROcodone-homatropine (HYCODAN) 5-1.5 MG/5ML syrup Take 5 mLs by mouth daily as needed for cough.    Marland Kitchen ibuprofen (ADVIL) 200 MG tablet Take  400 mg by mouth every 6 (six) hours as needed (Knee pain).    Marland Kitchen lidocaine (XYLOCAINE) 2 % solution 5 ml swish and spit or swallow Q 3 hours prn oral and esophageal pain 200 mL 2  . LORazepam (ATIVAN) 0.5 MG tablet Take 0.5 mg by mouth at bedtime.     . magic mouthwash SOLN 5 to 10 ml swish and spit or swallow QID prn mouth and esophageal pain 240 mL 2  . melatonin 5 MG TABS Take 5 mg by mouth at bedtime.    .  metoprolol succinate (TOPROL XL) 50 MG 24 hr tablet Take 50 mg by mouth daily.     Marland Kitchen oxyCODONE-acetaminophen (PERCOCET/ROXICET) 5-325 MG tablet Take 1 tablet by mouth every 4 (four) hours as needed for severe pain. 45 tablet 0  . oxymetazoline (AFRIN) 0.05 % nasal spray Place 1 spray into both nostrils daily as needed for congestion.    . prochlorperazine (COMPAZINE) 10 MG tablet Take 1 tablet (10 mg total) by mouth every 6 (six) hours as needed for nausea or vomiting. 30 tablet 0  . RABEprazole (ACIPHEX) 20 MG tablet Take 1 tablet (20 mg total) by mouth 2 (two) times a week.    . sucralfate (CARAFATE) 1 g tablet Take 1 tablet (1 g total) by mouth 4 (four) times daily -  with meals and at bedtime. Crush and dissolve in 10 mL's of warm water prior to swallowing 120 tablet 1  . sucralfate (CARAFATE) 1 GM/10ML suspension Take 10 mLs (1 g total) by mouth 4 (four) times daily -  with meals and at bedtime. 420 mL 1  . SYNTHROID 112 MCG tablet Take 112 mcg by mouth daily before breakfast.     . vitamin B-12 (CYANOCOBALAMIN) 1000 MCG tablet Take 1,000 mcg by mouth daily.     No current facility-administered medications for this visit.    SURGICAL HISTORY:  Past Surgical History:  Procedure Laterality Date  . APPENDECTOMY    . BRONCHIAL BIOPSY  10/20/2019   Procedure: BRONCHIAL BIOPSIES;  Surgeon: Collene Gobble, MD;  Location: [facility] ENDOSCOPY;  Service: Pulmonary;;  . BRONCHIAL BRUSHINGS  10/20/2019   Procedure: BRONCHIAL BRUSHINGS;  Surgeon: Collene Gobble, MD;  Location: [facility] ENDOSCOPY;  Service: Pulmonary;;  . BRONCHIAL NEEDLE ASPIRATION BIOPSY  10/20/2019   Procedure: BRONCHIAL NEEDLE ASPIRATION BIOPSIES;  Surgeon: Collene Gobble, MD;  Location: [place];  Service: Pulmonary;;  . BRONCHIAL WASHINGS  10/20/2019   Procedure: BRONCHIAL WASHINGS;  Surgeon: Collene Gobble, MD;  Location: [facility] ENDOSCOPY;  Service: Pulmonary;;  . CARPAL TUNNEL RELEASE Right 2011   Dr. Amedeo Plenty  . COLPORRHAPHY      posterior  . HAND SURGERY Right 2016   Nerve surgery, Dr. Amedeo Plenty  . OVARIAN CYST REMOVAL    . RECTOCELE REPAIR  2011   w/TVH and sling  . TONSILLECTOMY    . TONSILLECTOMY    . TOTAL KNEE ARTHROPLASTY Left 09/09/2018   Procedure: TOTAL KNEE ARTHROPLASTY, CORTISONE INJECTION RIGHT KNEE;  Surgeon: Paralee Cancel, MD;  Location: [facility] ORS;  Service: Orthopedics;  Laterality: Left;  70 mins  . TOTAL VAGINAL HYSTERECTOMY  10/18/2009   rectocele repair, sling  . TUBAL LIGATION Bilateral   . VARICOSE VEIN SURGERY    . VIDEO BRONCHOSCOPY WITH ENDOBRONCHIAL NAVIGATION N/A 10/20/2019   Procedure: VIDEO BRONCHOSCOPY WITH ENDOBRONCHIAL NAVIGATION;  Surgeon: Collene Gobble, MD;  Location: [facility] ENDOSCOPY;  Service: Pulmonary;  Laterality: N/A;    REVIEW OF SYSTEMS:  Constitutional: positive for fatigue Eyes: negative Ears, nose, mouth, throat, and face: negative Respiratory: positive for dyspnea on exertion Cardiovascular: negative Gastrointestinal: negative Genitourinary:negative Integument/breast: negative Hematologic/lymphatic: negative Musculoskeletal:negative Neurological: negative Behavioral/Psych: negative Endocrine: negative Allergic/Immunologic: negative   PHYSICAL EXAMINATION: General appearance: alert, cooperative, fatigued and no distress Head: Normocephalic, without obvious abnormality, atraumatic Neck: no adenopathy, no JVD, supple, symmetrical, trachea midline and thyroid not enlarged, symmetric, no tenderness/mass/nodules Lymph nodes: Cervical, supraclavicular, and axillary nodes normal. Resp: clear to auscultation bilaterally Back: symmetric, no curvature. ROM normal. No CVA tenderness. Cardio: regular rate and rhythm, S1, S2 normal, no murmur, click, rub or gallop GI: soft, non-tender; bowel sounds normal; no masses,  no organomegaly Extremities: extremities normal, atraumatic, no cyanosis or edema Neurologic: Alert and oriented X 3, normal strength and tone. Normal symmetric  reflexes. Normal coordination and gait  ECOG PERFORMANCE STATUS: 1 - Symptomatic but completely ambulatory  Blood pressure 124/71, pulse 89, temperature (!) 97.2 F (36.2 C), temperature source Tympanic, resp. rate 18, height _0  (1.651 m), weight 170 lb 8 oz (77.3 kg), last menstrual period 08/21/2002, SpO2 95 %.  LABORATORY DATA: Lab Results  Component Value Date   WBC 3.8 (L) 01/26/2020   HGB 8.8 (L) 01/26/2020   HCT 26.6 (L) 01/26/2020   MCV 96.4 01/26/2020   PLT 209 01/26/2020      Chemistry      Component Value Date/Time   NA 140 01/26/2020 1004   K 4.1 01/26/2020 1004   CL 107 01/26/2020 1004   CO2 27 01/26/2020 1004   BUN 6 (L) 01/26/2020 1004   CREATININE 0.77 01/26/2020 1004      Component Value Date/Time   CALCIUM 9.6 01/26/2020 1004   ALKPHOS 108 01/26/2020 1004   AST 17 01/26/2020 1004   ALT 11 01/26/2020 1004   BILITOT 0.5 01/26/2020 1004       RADIOGRAPHIC STUDIES: CT Chest W Contrast  Result Date: 01/27/2020 CLINICAL DATA:  Lung cancer, chemotherapy and XRT complete, cough, shortness of breath EXAM: CT CHEST WITH CONTRAST TECHNIQUE: Multidetector CT imaging of the chest was performed during intravenous contrast administration. CONTRAST:  63m OMNIPAQUE IOHEXOL 300 MG/ML  SOLN COMPARISON:  PET-CT dated 11/02/2019 FINDINGS: Cardiovascular: Heart is normal in size.  No pericardial effusion. No evidence of thoracic aortic aneurysm. Mild atherosclerotic calcifications of the aortic arch. Mild coronary atherosclerosis of the right coronary artery. Mediastinum/Nodes: 8 mm short axis subcarinal node, previously 10 mm. Visualized thyroid is unremarkable. Lungs/Pleura: 2.6 x 7.0 cm anterior left upper lobe mass (series 7/image 59), previously 3.4 x 7.8 cm, corresponding to the patient's known primary bronchogenic neoplasm. Surrounding mild patchy opacities in the left upper lobe likely reflect radiation changes. Additional patchy ground-glass/subpleural opacities in  the lungs bilaterally (series 7/image 73) favor atypical infection/pneumonia, including the possibility of COVID. Small left pleural effusion. No pneumothorax. Upper Abdomen: Stable nodular thickening of the right adrenal gland (series 2/image 146), unchanged. 4.0 cm right upper pole renal cyst, incompletely visualized. Musculoskeletal: Visualized osseous structures are within normal limits. IMPRESSION: 2.6 by 7.0 cm anterior left upper lobe mass, corresponding to the patient's known primary bronchogenic neoplasm, mildly improved. Surrounding radiation changes. 8 mm short axis subcarinal node, mildly improved. Small left pleural effusion. Additional patchy ground-glass/subpleural opacities in the lungs bilaterally, favoring atypical infection/pneumonia, including the possibility of COVID. Aortic Atherosclerosis (ICD10-I70.0). Electronically Signed   By: SJulian HyM.D.   On: 01/27/2020 10:27    ASSESSMENT AND PLAN: This is a very pleasant 68  years old white female recently diagnosed with a stage IIIc/IV (T3, N3, M0/M1a) non-small cell lung cancer, adenocarcinoma presented with large left upper lobe lung mass in addition to left infrahilar, right hilar and subcarinal lymphadenopathy and small left pleural effusion diagnosed in August 2021 with positive EGFR mutation in exon 21 (L858R). The patient completed a course of concurrent chemoradiation with weekly carboplatin and paclitaxel status post 7 cycles.  She tolerated her treatment well except for fatigue as well as a skin burn and cough. She had repeat CT scan of the chest performed recently.  I personally and independently reviewed the scan images and discussed the results with the patient and her husband. Her scan showed mild improvement of the left upper lobe lung mass as well as the mediastinal lymphadenopathy.  The patient continues to have small left pleural effusion that is suspicious given her presentation. I had a lengthy discussion with the  patient regarding her future treatment options.  I explained to the patient that the current standard of care is consolidation treatment with immunotherapy but with no that patient with EGFR mutation do not have great response to treatment with immunotherapy especially in the advanced stage disease. I discussed with the patient the option of treating her with targeted therapy with osimertinib 80 mg p.o. daily which showed significant response rate as well as progression free survival and overall survival but mainly for patient with a stage IV non-small cell lung cancer with positive EGFR mutation.  The patient has left sided small pleural effusion and she did not have great response to the previous course of concurrent chemoradiation.  I feel she will be a good candidate for treatment with the targeted therapy rather than the consolidation immunotherapy at this point but the patient was giving the 2 options of consolidation immunotherapy versus targeted therapy. After a lengthy discussion the patient and her husband would like to proceed with the targeted therapy.  She will start Tagrisso 80 mg p.o. daily hopefully in the next few days. I discussed with the patient the adverse effect of this treatment including but not limited to skin rash, diarrhea, dry skin, inflammation of the lung, kidney, liver as well as QT prolongation. I gave the patient after visit summary with more information about Tagrisso. She will come back for follow-up visit in 3 weeks for evaluation and repeat blood work. She was advised to call immediately if she has any concerning symptoms in the interval.  The patient voices understanding of current disease status and treatment options and is in agreement with the current care plan.  All questions were answered. The patient knows to call the clinic with any problems, questions or concerns. We can certainly see the patient much sooner if necessary.  Disclaimer: This note was dictated  with voice recognition software. Similar sounding words can inadvertently be transcribed and may not be corrected upon review.

## 2020-01-28 NOTE — Telephone Encounter (Signed)
Oral Oncology Patient Advocate Encounter  Received notification from [facility] that prior authorization for Tagrisso is required.  PA submitted on CoverMyMeds Key [id number] Status is pending  Oral Oncology Clinic will continue to follow.   [place] Patient Linda Woods Phone [phone] Fax [phone] 01/28/2020 3:07 PM

## 2020-01-28 NOTE — Progress Notes (Signed)
DISCONTINUE ON PATHWAY REGIMEN - Non-Small Cell Lung     Administer weekly:     Paclitaxel      Carboplatin   **Always confirm dose/schedule in your pharmacy ordering system**  REASON: Other Reason PRIOR TREATMENT: [id number]: Carboplatin AUC=2 + Paclitaxel 45 mg/m2 Weekly During Radiation TREATMENT RESPONSE: Partial Response (PR)  START ON PATHWAY REGIMEN - Non-Small Cell Lung     A cycle is every 28 days:     Osimertinib   **Always confirm dose/schedule in your pharmacy ordering system**  Patient Characteristics: Stage IV Metastatic, Nonsquamous, Initial Molecular Targeted Therapy, EGFR Mutation - Common (Exon 19 Deletion or Exon 21 L858R Substitution) Therapeutic Status: Stage IV Metastatic Histology: Nonsquamous Cell ROS1 Rearrangement Status: Negative Other Mutations/Biomarkers: No Other Actionable Mutations Chemotherapy/Immunotherapy LOT: Not Appropriate Molecular Targeted Therapy: Initial Molecular Targeted Therapy KRAS G12C Mutation Status: Negative MET Exon 14 Mutation Status: Negative RET Gene Fusion Status: Negative EGFR Mutation Status: Positive - Common (Exon 19 Deletion or Exon 21 L858R Substitution) NTRK Gene Fusion Status: Negative PD-L1 Expression Status: Did Not Order Test ALK Rearrangement Status: Negative BRAF V600E Mutation Status: Negative Intent of Therapy: Non-Curative / Palliative Intent, Discussed with Patient

## 2020-01-28 NOTE — Patient Instructions (Signed)
Osimertinib oral tablets What is this medicine? Osimertinib (OH sim ER ti nib) is a medicine that targets proteins in cancer cells and stops the cancer cells from growing. It is used to treat non-small cell lung cancer. This medicine may be used for other purposes; ask your health care provider or pharmacist if you have questions. COMMON BRAND NAME(S): Tagrisso What should I tell my health care provider before I take this medicine? They need to know if you have any of these conditions:  heart disease  history of irregular heartbeat  history of low levels of calcium, magnesium, or potassium in the blood  lung or breathing disease, like asthma  QT prolongation  scarring or thickening of the lungs  an unusual or allergic reaction to osimertinib, other medicines, foods, dyes, or preservatives  pregnant or trying to get pregnant  breast-feeding How should I use this medicine? Take osimertinib tablets by mouth with a glass of water. You can take it with or without food. Follow the directions on the prescription label. Take your medicine at regular intervals. Do not take it more often than directed. Do not stop taking except on your doctor's advice. If swallowing is difficult, you can take the tablet by placing your dose in a container with 2 ounces of cool water only. Stir until the tablet is in small pieces. The tablet will not completely dissolve. Do not crush or chew. Drink the water and the tablet pieces right away. Then add 4 to 8 ounces of water to the same container and drink to make sure you take your full dose. Talk to your pediatrician regarding the use of this medicine in children. Special care may be needed. Overdosage: If you think you have taken too much of this medicine contact a poison control center or emergency room at once. NOTE: This medicine is only for you. Do not share this medicine with others. What if I miss a dose? If you miss a dose, do not make up for the missed  dose. Take your next dose at your regular time. Do not take extra or double doses. What may interact with this medicine? Do not take this medicine with any of the following medications:  cisapride  dronedarone  penicillamine  pimozide  thioridazine This medicine may interact with the following medications:  certain medicines for seizures like carbamazepine, phenobarbital, phenytoin  other medicines that prolong the QT interval (cause an abnormal heart rhythm) like dofetilide, ziprasidone  rifampin  rosuvastatin  St. John's Wort  sulfasalazine  topotecan This list may not describe all possible interactions. Give your health care provider a list of all the medicines, herbs, non-prescription drugs, or dietary supplements you use. Also tell them if you smoke, drink alcohol, or use illegal drugs. Some items may interact with your medicine. What should I watch for while using this medicine? Visit your doctor for regular check ups. Report any side effects. Continue your course of treatment unless your doctor tells you to stop. You will need blood work done while you are taking this medicine. Do not become pregnant while taking this medicine or for 6 weeks after the last dose. Males with female partners of reproductive potential should use effective contraception for 4 months after the last dose. Women should inform their doctor if they wish to become pregnant or think they might be pregnant. There is a potential for serious side effects to an unborn child. This medicine may interfere with the ability to have a child for both men  and women. You should talk with your doctor or health care professional if you are concerned about your fertility. Talk to your health care professional or pharmacist for more information. Do not breast-feed an infant while taking this medicine or for 2 weeks after the last dose. This drug may make you feel generally unwell. This is not uncommon, as chemotherapy can  affect healthy cells as well as cancer cells. Report any side effects. Continue your course of treatment even though you feel ill unless your doctor tells you to stop. What side effects may I notice from receiving this medicine? Side effects that you should report to your doctor or health care professional as soon as possible:  allergic reactions like skin rash, itching or hives, swelling of the face, lips, or tongue  red spots on the skin  redness, blistering, peeling, or loosening of the skin, including inside the mouth  signs and symptoms of a dangerous change in heartbeat or heart rhythm like chest pain; dizziness; fast or irregular heartbeat; palpitations; feeling faint or lightheaded, falls; breathing problems  signs and symptoms of increased potassium like muscle weakness; chest pain; or fast, irregular heartbeat  signs and symptoms of low potassium like muscle cramps or muscle pain; chest pain; dizziness; feeling faint or lightheaded, falls; palpitations; breathing problems; or fast, irregular heartbeat  swelling of the legs or ankles  unusually weak or tired Side effects that usually do not require medical attention (report to your doctor or health care professional if they continue or are bothersome):  diarrhea  dry skin  nail changes This list may not describe all possible side effects. Call your doctor for medical advice about side effects. You may report side effects to FDA at 1-800-FDA-1088. Where should I keep my medicine? Keep out of the reach of children. Store at room temperature between 20 and 25 degrees C (68 and 77 degrees F). Throw away any unused medicine after the expiration date. NOTE: This sheet is a summary. It may not cover all possible information. If you have questions about this medicine, talk to your doctor, pharmacist, or health care provider.  2020 Elsevier/Gold Standard (2018-02-10 18:27:35)

## 2020-01-29 MED FILL — TAGRISSO 80 MG TABLET: 80 | 30 days supply | Qty: 30 | Fill #0

## 2020-01-29 NOTE — Telephone Encounter (Signed)
Oral Oncology Patient Advocate Encounter  Prior Authorization for Linda Woods has been approved.    PA# [id number] Effective dates: 01/28/20 through 01/27/21  Patients co-pay is $2726  Oral Oncology Clinic will continue to follow.   Linda Woods Phone [phone] Fax [phone] 01/29/2020 8:53 AM

## 2020-01-29 NOTE — Telephone Encounter (Signed)
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   Counseled patient on administration, dosing, side effects, monitoring, drug-food interactions, safe handling, storage, and disposal.  Patient will take Tagrisso 80 tablets, 1 tablet by mouth once daily, without regard to food.  Tagrisso start date: 01/31/20  Adverse effects include but are not limited to: diarrhea, mouth sores, decreased appetitie, fatigue, dry skin, rash, nail changes, altered cardiac conduction, and decreased blood counts or electrolytes.  Patient will obtain anti diarrheal and alert the office of 4 or more loose stools above baseline.   Reviewed with patient importance of keeping a medication schedule and plan for any missed doses. No barriers to medication adherence identified.  Medication reconciliation performed and medication/allergy list updated.  Insurance authorization for [facility] has been obtained. Test claim at the pharmacy revealed copayment $2726 for 1st fill of Tagrisso. Patient will pick up medication from the [facility] on 01/30/20.   Patient informed the pharmacy will reach out 5-7 days prior to needing next fill of Tagrisso to coordinate continued medication acquisition to prevent break in therapy.  All questions answered.  Ms. Melgarejo voiced understanding and appreciation.   Medication education handout placed in mail for patient. Patient knows to call the office with questions or concerns. Oral Chemotherapy Clinic phone number provided to patient.   Leron Croak, PharmD, BCPS Hematology/Oncology Clinical Pharmacist [facility] [phone] 01/29/2020 3:36 PM

## 2020-02-01 ENCOUNTER — Telehealth: Payer: Self-pay | Admitting: *Deleted

## 2020-02-01 NOTE — Telephone Encounter (Signed)
CALLED PATIENT TO ALTER FU APPT. FOR 02-08-20 DUE TO DR. KINARD BEING ON VACATION, RESCHEDULED FOR 02-29-20, PATIENT AGREED TO NEW DATE AND TIME

## 2020-02-03 ENCOUNTER — Encounter: Payer: Self-pay | Admitting: Internal Medicine

## 2020-02-08 ENCOUNTER — Ambulatory Visit: Payer: Self-pay | Admitting: Radiation Oncology

## 2020-02-15 ENCOUNTER — Encounter: Payer: Self-pay | Admitting: Internal Medicine

## 2020-02-15 ENCOUNTER — Telehealth: Payer: Self-pay | Admitting: Internal Medicine

## 2020-02-15 NOTE — Telephone Encounter (Signed)
Rescheduled appointment per 12/27 schedule message. Patient is aware of changes.

## 2020-02-16 ENCOUNTER — Telehealth: Payer: Self-pay

## 2020-02-16 NOTE — Telephone Encounter (Signed)
Can I take Lomotil to help control explosive diarrhea?  This happens when I eat a meal or cough.  I am taking in lots of water. Thanks    I spoke with pt and confirmed Lomotil was prescribed to her by her PCP. Pt was advised to contact her PCP for directions on use and frequency. Pt indicated this is a medication she has taken for over a year but wanted to make sure it doesn't interact with her rx Tagrisso. I confirmed for the pt it does not as this was reviewed with her by the pharmacist 01/27/20. Pt expressed understanding of this information.

## 2020-02-18 ENCOUNTER — Inpatient Hospital Stay: Payer: Medicare Other

## 2020-02-18 ENCOUNTER — Inpatient Hospital Stay: Payer: Medicare Other | Admitting: Internal Medicine

## 2020-02-22 ENCOUNTER — Inpatient Hospital Stay (HOSPITAL_BASED_OUTPATIENT_CLINIC_OR_DEPARTMENT_OTHER): Payer: Medicare Other | Admitting: Internal Medicine

## 2020-02-22 ENCOUNTER — Inpatient Hospital Stay: Payer: Medicare Other | Attending: Internal Medicine

## 2020-02-22 ENCOUNTER — Telehealth: Payer: Self-pay | Admitting: Internal Medicine

## 2020-02-22 ENCOUNTER — Other Ambulatory Visit: Payer: Self-pay

## 2020-02-22 VITALS — BP 138/72 | HR 97 | Temp 97.9°F | Resp 17 | Ht 65.0 in | Wt 163.4 lb

## 2020-02-22 DIAGNOSIS — K521 Toxic gastroenteritis and colitis: Secondary | ICD-10-CM | POA: Diagnosis not present

## 2020-02-22 DIAGNOSIS — Z5111 Encounter for antineoplastic chemotherapy: Secondary | ICD-10-CM

## 2020-02-22 DIAGNOSIS — E039 Hypothyroidism, unspecified: Secondary | ICD-10-CM | POA: Insufficient documentation

## 2020-02-22 DIAGNOSIS — R197 Diarrhea, unspecified: Secondary | ICD-10-CM | POA: Insufficient documentation

## 2020-02-22 DIAGNOSIS — I10 Essential (primary) hypertension: Secondary | ICD-10-CM | POA: Insufficient documentation

## 2020-02-22 DIAGNOSIS — C3492 Malignant neoplasm of unspecified part of left bronchus or lung: Secondary | ICD-10-CM

## 2020-02-22 DIAGNOSIS — C3412 Malignant neoplasm of upper lobe, left bronchus or lung: Secondary | ICD-10-CM | POA: Diagnosis present

## 2020-02-22 LAB — CBC WITH DIFFERENTIAL (CANCER CENTER ONLY)
Abs Immature Granulocytes: 0.01 10*3/uL (ref 0.00–0.07)
Basophils Absolute: 0 10*3/uL (ref 0.0–0.1)
Basophils Relative: 0 %
Eosinophils Absolute: 0.1 10*3/uL (ref 0.0–0.5)
Eosinophils Relative: 3 %
HCT: 29.3 % — ABNORMAL LOW (ref 36.0–46.0)
Hemoglobin: 9.4 g/dL — ABNORMAL LOW (ref 12.0–15.0)
Immature Granulocytes: 0 %
Lymphocytes Relative: 13 %
Lymphs Abs: 0.5 10*3/uL — ABNORMAL LOW (ref 0.7–4.0)
MCH: 29.6 pg (ref 26.0–34.0)
MCHC: 32.1 g/dL (ref 30.0–36.0)
MCV: 92.1 fL (ref 80.0–100.0)
Monocytes Absolute: 0.4 10*3/uL (ref 0.1–1.0)
Monocytes Relative: 12 %
Neutro Abs: 2.5 10*3/uL (ref 1.7–7.7)
Neutrophils Relative %: 72 %
Platelet Count: 163 10*3/uL (ref 150–400)
RBC: 3.18 MIL/uL — ABNORMAL LOW (ref 3.87–5.11)
RDW: 13.8 % (ref 11.5–15.5)
WBC Count: 3.6 10*3/uL — ABNORMAL LOW (ref 4.0–10.5)
nRBC: 0 % (ref 0.0–0.2)

## 2020-02-22 LAB — CMP (CANCER CENTER ONLY)
ALT: 11 U/L (ref 0–44)
AST: 14 U/L — ABNORMAL LOW (ref 15–41)
Albumin: 3.1 g/dL — ABNORMAL LOW (ref 3.5–5.0)
Alkaline Phosphatase: 90 U/L (ref 38–126)
Anion gap: 5 (ref 5–15)
BUN: 6 mg/dL — ABNORMAL LOW (ref 8–23)
CO2: 27 mmol/L (ref 22–32)
Calcium: 8.9 mg/dL (ref 8.9–10.3)
Chloride: 106 mmol/L (ref 98–111)
Creatinine: 0.76 mg/dL (ref 0.44–1.00)
GFR, Estimated: >60 mL/min (ref >60)
Glucose, Bld: 97 mg/dL (ref 70–99)
Potassium: 3.8 mmol/L (ref 3.5–5.1)
Sodium: 138 mmol/L (ref 135–145)
Total Bilirubin: 0.4 mg/dL (ref 0.3–1.2)
Total Protein: 7.7 g/dL (ref 6.5–8.1)

## 2020-02-22 MED ORDER — METHYLPREDNISOLONE 4 MG PO TBPK: ORAL_TABLET | ORAL | 0 refills | Status: DC

## 2020-02-22 MED FILL — TAGRISSO 80 MG TABLET: 80 | 30 days supply | Qty: 30 | Fill #1

## 2020-02-22 NOTE — Progress Notes (Signed)
[facility] [phone]   [phone]  OFFICE PROGRESS NOTE  [name], MD [place] [address]  DIAGNOSIS: Stage IIIc (T3, N3, M0)non-small cell lung cancer, adenocarcinoma presented with large left upper lobe lung mass in addition to left infrahilar and right hilar and subcarinal lymphadenopathy diagnosed in August 2021.  Molecular Biomarkers: Insufficient tissue for foundation 1. Guardant 360 results.  DETECTED ALTERATION(S) / BIOMARKER(S) % CFDNA OR AMPLIFICATION ASSOCIATED FDA-APPROVED THERAPIES CLINICAL TRIAL AVAILABILITY  EGFR L858R, 1.2%, Afatinib, Dacomitinib, Erlotinib, Gefitinib, Osimertinib, Ramucirumab Yes [id number], 1.0%, Afatinib, Dacomitinib, Erlotinib, Gefitinib, Osimertinib, Ramucirumab Yes [id number] 1.0% None  No  PRIOR THERAPY: Weekly concurrent chemoradiation with carboplatin for an AUC of 2 and paclitaxel 45 mg/m2.First dose expected on 11/16/2019. Status post 7 cycles.  CURRENT THERAPY:   Tagrisso 80 mg p.o. daily. First dose started 01/31/2020.  INTERVAL HISTORY: Linda Woods 70 y.o. female returns to the clinic today for follow-up visit accompanied by her husband.  The patient is feeling fine today with no concerning complaints except for the persistent fatigue as well as shortness of breath with exertion and frequent episodes of diarrhea.  She takes Lomotil as needed around 2 times daily.  She denied having any current chest pain but has cough with no hemoptysis.  She denied having any fever or chills.  She has no nausea, vomiting or constipation.  She denied having any abdominal pain.  She is tolerating her treatment with Tagrisso fairly well except for the diarrhea.  MEDICAL HISTORY: Past Medical History:  Diagnosis Date  . Anemia    as a child  . Anxiety   . Bursitis   . Chronic reflux esophagitis   . Complication of anesthesia   . Diarrhea, functional   . Diverticulosis    . GERD (gastroesophageal reflux disease)   . History of kidney stones   . Hypertension   . Knee pain    right knee-seeing ortho  . Osteoarthritis   . Plantar fasciitis   . PONV (postoperative nausea and vomiting)    has used the patch before and that helps  . Pure hypercholesterolemia   . RLS (restless legs syndrome)   . Superficial vein thrombosis   . Thyroid disease    hypothyroid    ALLERGIES:  is allergic to crestor [rosuvastatin calcium] and statins.  MEDICATIONS:  Current Outpatient Medications  Medication Sig Dispense Refill  . cholecalciferol (VITAMIN D3) 25 MCG (1000 UNIT) tablet Take 1,000 Units by mouth daily. Pt states she take 2000 units/day    . diphenoxylate-atropine (LOMOTIL) 2.5-0.025 MG tablet Take 1 tablet by mouth daily as needed for diarrhea or loose stools.     . famotidine (PEPCID) 40 MG tablet Take 40 mg by mouth See admin instructions. Five times a week    . fexofenadine (ALLEGRA) 180 MG tablet Take 180 mg by mouth daily.    . fluticasone (FLONASE) 50 MCG/ACT nasal spray Place 2 sprays into both nostrils daily.    Marland Kitchen HYDROcodone-acetaminophen (NORCO/VICODIN) 5-325 MG tablet Take 0.5 tablets by mouth daily as needed (Knee pain).     Marland Kitchen HYDROcodone-homatropine (HYCODAN) 5-1.5 MG/5ML syrup Take 5 mLs by mouth daily as needed for cough.    Marland Kitchen ibuprofen (ADVIL) 200 MG tablet Take 400 mg by mouth every 6 (six) hours as needed (Knee pain).    Marland Kitchen lidocaine (XYLOCAINE) 2 % solution 5 ml swish and spit or swallow Q 3 hours prn oral and esophageal pain  200 mL 2  . LORazepam (ATIVAN) 0.5 MG tablet Take 0.5 mg by mouth at bedtime.     . magic mouthwash SOLN 5 to 10 ml swish and spit or swallow QID prn mouth and esophageal pain 240 mL 2  . melatonin 5 MG TABS Take 5 mg by mouth at bedtime.    Marland Kitchen osimertinib mesylate (TAGRISSO) 80 MG tablet Take 1 tablet (80 mg total) by mouth daily. 30 tablet 2  . oxyCODONE-acetaminophen (PERCOCET/ROXICET) 5-325 MG tablet Take 1 tablet by  mouth every 4 (four) hours as needed for severe pain. 45 tablet 0  . oxymetazoline (AFRIN) 0.05 % nasal spray Place 1 spray into both nostrils daily as needed for congestion.    . prochlorperazine (COMPAZINE) 10 MG tablet Take 1 tablet (10 mg total) by mouth every 6 (six) hours as needed for nausea or vomiting. 30 tablet 0  . RABEprazole (ACIPHEX) 20 MG tablet Take 1 tablet (20 mg total) by mouth 2 (two) times a week.    . sucralfate (CARAFATE) 1 g tablet Take 1 tablet (1 g total) by mouth 4 (four) times daily -  with meals and at bedtime. Crush and dissolve in 10 mL's of warm water prior to swallowing 120 tablet 1  . sucralfate (CARAFATE) 1 GM/10ML suspension Take 10 mLs (1 g total) by mouth 4 (four) times daily -  with meals and at bedtime. 420 mL 1  . SYNTHROID 112 MCG tablet Take 112 mcg by mouth daily before breakfast.     . vitamin B-12 (CYANOCOBALAMIN) 1000 MCG tablet Take 1,000 mcg by mouth daily.    . metoprolol succinate (TOPROL XL) 50 MG 24 hr tablet Take 50 mg by mouth daily.      No current facility-administered medications for this visit.    SURGICAL HISTORY:  Past Surgical History:  Procedure Laterality Date  . APPENDECTOMY    . BRONCHIAL BIOPSY  10/20/2019   Procedure: BRONCHIAL BIOPSIES;  Surgeon: Collene Gobble, MD;  Location: [facility] ENDOSCOPY;  Service: Pulmonary;;  . BRONCHIAL BRUSHINGS  10/20/2019   Procedure: BRONCHIAL BRUSHINGS;  Surgeon: Collene Gobble, MD;  Location: [facility] ENDOSCOPY;  Service: Pulmonary;;  . BRONCHIAL NEEDLE ASPIRATION BIOPSY  10/20/2019   Procedure: BRONCHIAL NEEDLE ASPIRATION BIOPSIES;  Surgeon: Collene Gobble, MD;  Location: [facility];  Service: Pulmonary;;  . BRONCHIAL WASHINGS  10/20/2019   Procedure: BRONCHIAL WASHINGS;  Surgeon: Collene Gobble, MD;  Location: [facility] ENDOSCOPY;  Service: Pulmonary;;  . CARPAL TUNNEL RELEASE Right 2011   Dr. Amedeo Plenty  . COLPORRHAPHY     posterior  . HAND SURGERY Right 2016   Nerve surgery, Dr. Amedeo Plenty  . OVARIAN CYST  REMOVAL    . RECTOCELE REPAIR  2011   w/TVH and sling  . TONSILLECTOMY    . TONSILLECTOMY    . TOTAL KNEE ARTHROPLASTY Left 09/09/2018   Procedure: TOTAL KNEE ARTHROPLASTY, CORTISONE INJECTION RIGHT KNEE;  Surgeon: Paralee Cancel, MD;  Location: [facility] ORS;  Service: Orthopedics;  Laterality: Left;  70 mins  . TOTAL VAGINAL HYSTERECTOMY  10/18/2009   rectocele repair, sling  . TUBAL LIGATION Bilateral   . VARICOSE VEIN SURGERY    . VIDEO BRONCHOSCOPY WITH ENDOBRONCHIAL NAVIGATION N/A 10/20/2019   Procedure: VIDEO BRONCHOSCOPY WITH ENDOBRONCHIAL NAVIGATION;  Surgeon: Collene Gobble, MD;  Location: [facility] ENDOSCOPY;  Service: Pulmonary;  Laterality: N/A;    REVIEW OF SYSTEMS:  Constitutional: positive for fatigue Eyes: negative Ears, nose, mouth, throat, and face: negative Respiratory: positive for  dyspnea on exertion Cardiovascular: negative Gastrointestinal: positive for diarrhea Genitourinary:negative Integument/breast: negative Hematologic/lymphatic: negative Musculoskeletal:negative Neurological: negative Behavioral/Psych: negative Endocrine: negative Allergic/Immunologic: negative   PHYSICAL EXAMINATION: General appearance: alert, cooperative, fatigued and no distress Head: Normocephalic, without obvious abnormality, atraumatic Neck: no adenopathy, no JVD, supple, symmetrical, trachea midline and thyroid not enlarged, symmetric, no tenderness/mass/nodules Lymph nodes: Cervical, supraclavicular, and axillary nodes normal. Resp: clear to auscultation bilaterally Back: symmetric, no curvature. ROM normal. No CVA tenderness. Cardio: regular rate and rhythm, S1, S2 normal, no murmur, click, rub or gallop GI: soft, non-tender; bowel sounds normal; no masses,  no organomegaly Extremities: extremities normal, atraumatic, no cyanosis or edema Neurologic: Alert and oriented X 3, normal strength and tone. Normal symmetric reflexes. Normal coordination and gait  ECOG PERFORMANCE STATUS: 1 -  Symptomatic but completely ambulatory  Blood pressure 138/72, pulse 97, temperature 97.9 F (36.6 C), temperature source Tympanic, resp. rate 17, height '5\' 5"'  (1.651 m), weight 163 lb 6.4 oz (74.1 kg), last menstrual period 08/21/2002, SpO2 99 %.  LABORATORY DATA: Lab Results  Component Value Date   WBC 3.6 (L) 02/22/2020   HGB 9.4 (L) 02/22/2020   HCT 29.3 (L) 02/22/2020   MCV 92.1 02/22/2020   PLT 163 02/22/2020      Chemistry      Component Value Date/Time   NA 140 01/26/2020 1004   K 4.1 01/26/2020 1004   CL 107 01/26/2020 1004   CO2 27 01/26/2020 1004   BUN 6 (L) 01/26/2020 1004   CREATININE 0.77 01/26/2020 1004      Component Value Date/Time   CALCIUM 9.6 01/26/2020 1004   ALKPHOS 108 01/26/2020 1004   AST 17 01/26/2020 1004   ALT 11 01/26/2020 1004   BILITOT 0.5 01/26/2020 1004       RADIOGRAPHIC STUDIES: CT Chest W Contrast  Result Date: 01/27/2020 CLINICAL DATA:  Lung cancer, chemotherapy and XRT complete, cough, shortness of breath EXAM: CT CHEST WITH CONTRAST TECHNIQUE: Multidetector CT imaging of the chest was performed during intravenous contrast administration. CONTRAST:  22m OMNIPAQUE IOHEXOL 300 MG/ML  SOLN COMPARISON:  PET-CT dated 11/02/2019 FINDINGS: Cardiovascular: Heart is normal in size.  No pericardial effusion. No evidence of thoracic aortic aneurysm. Mild atherosclerotic calcifications of the aortic arch. Mild coronary atherosclerosis of the right coronary artery. Mediastinum/Nodes: 8 mm short axis subcarinal node, previously 10 mm. Visualized thyroid is unremarkable. Lungs/Pleura: 2.6 x 7.0 cm anterior left upper lobe mass (series 7/image 59), previously 3.4 x 7.8 cm, corresponding to the patient's known primary bronchogenic neoplasm. Surrounding mild patchy opacities in the left upper lobe likely reflect radiation changes. Additional patchy ground-glass/subpleural opacities in the lungs bilaterally (series 7/image 73) favor atypical  infection/pneumonia, including the possibility of COVID. Small left pleural effusion. No pneumothorax. Upper Abdomen: Stable nodular thickening of the right adrenal gland (series 2/image 146), unchanged. 4.0 cm right upper pole renal cyst, incompletely visualized. Musculoskeletal: Visualized osseous structures are within normal limits. IMPRESSION: 2.6 by 7.0 cm anterior left upper lobe mass, corresponding to the patient's known primary bronchogenic neoplasm, mildly improved. Surrounding radiation changes. 8 mm short axis subcarinal node, mildly improved. Small left pleural effusion. Additional patchy ground-glass/subpleural opacities in the lungs bilaterally, favoring atypical infection/pneumonia, including the possibility of COVID. Aortic Atherosclerosis (ICD10-I70.0). Electronically Signed   By: SJulian HyM.D.   On: 01/27/2020 10:27    ASSESSMENT AND PLAN: This is a very pleasant 70years old white female recently diagnosed with a stage IIIc/IV (T3, N3, M0/M1a) non-small cell  lung cancer, adenocarcinoma presented with large left upper lobe lung mass in addition to left infrahilar, right hilar and subcarinal lymphadenopathy and small left pleural effusion diagnosed in August 2021 with positive EGFR mutation in exon 21 (L858R). The patient completed a course of concurrent chemoradiation with weekly carboplatin and paclitaxel status post 7 cycles.  She tolerated her treatment well except for fatigue as well as a skin burn and cough. Her scan showed mild improvement of the left upper lobe lung mass as well as the mediastinal lymphadenopathy.  The patient continues to have small left pleural effusion that is suspicious given her presentation. She is currently on treatment with Tagrisso 80 mg p.o. daily started on January 31, 2020.  The patient has been tolerating this treatment well except for few episodes of diarrhea and she takes Lomotil on as-needed basis. I recommended for her to take Imodium 2  tablets early in the morning and then 1 tablet after every episodes of diarrhea for a maximum of 8 tablets a day. For the shortness of breath and cough, I will start the patient on Medrol Dosepak. I recommended for the patient to continue her current treatment with Tagrisso with the same dose for now. I will see her back for follow-up visit in 2 weeks for evaluation and repeat blood work. She was advised to call immediately if she has any concerning symptoms in the interval.  The patient voices understanding of current disease status and treatment options and is in agreement with the current care plan.  All questions were answered. The patient knows to call the clinic with any problems, questions or concerns. We can certainly see the patient much sooner if necessary.  Disclaimer: This note was dictated with voice recognition software. Similar sounding words can inadvertently be transcribed and may not be corrected upon review.

## 2020-02-22 NOTE — Telephone Encounter (Signed)
Scheduled appointments per 1/3 los. Spoke to patient who is aware of appointment date and time.

## 2020-02-26 ENCOUNTER — Encounter: Payer: Self-pay | Admitting: Internal Medicine

## 2020-02-29 ENCOUNTER — Encounter: Payer: Self-pay | Admitting: Medical Oncology

## 2020-02-29 ENCOUNTER — Other Ambulatory Visit: Payer: Self-pay

## 2020-02-29 ENCOUNTER — Ambulatory Visit
Admission: RE | Admit: 2020-02-29 | Discharge: 2020-02-29 | Disposition: A | Discharge status: Home / Self Care | Payer: Medicare Other | Source: Ambulatory Visit | Attending: Radiation Oncology | Admitting: Radiation Oncology

## 2020-02-29 DIAGNOSIS — Z923 Personal history of irradiation: Secondary | ICD-10-CM | POA: Diagnosis not present

## 2020-02-29 DIAGNOSIS — Z79899 Other long term (current) drug therapy: Secondary | ICD-10-CM | POA: Diagnosis not present

## 2020-02-29 DIAGNOSIS — R0602 Shortness of breath: Secondary | ICD-10-CM | POA: Insufficient documentation

## 2020-02-29 DIAGNOSIS — R059 Cough, unspecified: Secondary | ICD-10-CM | POA: Insufficient documentation

## 2020-02-29 DIAGNOSIS — C3412 Malignant neoplasm of upper lobe, left bronchus or lung: Secondary | ICD-10-CM | POA: Insufficient documentation

## 2020-02-29 DIAGNOSIS — C3492 Malignant neoplasm of unspecified part of left bronchus or lung: Secondary | ICD-10-CM

## 2020-02-29 NOTE — Progress Notes (Signed)
Patient is here today for follow up post radiation to left lung completed November 2021.  Patient reports having pain 7 out of 10 to mid chest radiating into the back from treatment.  Dr. Julien Nordmann prescribed medrol dose pack and that improved her energy/cough/pain in the chest and she completed that as of yesterday and those symptoms are returning.  She is taking Oxycodone for pain relief.  She returns to Dr Julien Nordmann next week.  Patient reports shortness of breath with talking and exertion.  Patient reports cough worse in the morning.  Reports getting strangled with food occassionally but nothing worse than normal and changes in taste buds.  Reports appetite is approximately 50% of normal.  Patient reports fatigue but not as bas a before.    Vitals:   02/29/20 1535  BP: 121/74  Pulse: 75  Resp: 18  Temp: 98.2 F (36.8 C)  SpO2: 97%  Weight: 164 lb (74.4 kg)  Height: 5\' 5"  (1.651 m)

## 2020-02-29 NOTE — Progress Notes (Signed)
Radiation Oncology         [phone] ________________________________  Name: Linda Woods MRN: [id number]  Date: 02/29/2020  DOB: [birth year]  Follow-Up Visit Note  CC: [name], MD  [name], MD    ICD-10-CM   1. Adenocarcinoma of left lung, stage 3 (HCC)  C34.92     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, adenocarcinoma  Interval Since Last Radiation: One month, three weeks, and three days  Radiation Treatment Dates: 11/26/2019 through 01/06/2020 Site Technique Total Dose (Gy) Dose per Fx (Gy) Completed Fx Beam Energies  Lung, Left: Lung_Lt IMRT 46/46 2 23/23 6X  Lung, Left: Lung_Lt_Bst IMRT 14/14 2 7/7 6X    Narrative:  The patient returns today for routine follow-up. Chest CT scan on 01/26/2020 showed a 2.6 x 7.0 cm anterior left upper lobe mass that corresponded to the patient's known primary bronchogenic neoplasm, mildly improved. There were also noted to be surrounding radiation changes. Additionally, there was an 8 mm short axis subcarinal node that was mildly improved.  She was last seen by Dr. Julien Nordmann on 02/22/2020. She was started on Tagrisso on 01/31/20 and has been tolerating it well.  On review of systems, she reports some coughing and mild shortness of breath as well as chest tightness.  She was placed on steroids which have helped this issue. She denies hemoptysis.  Her skin along the left supraclavicular region has healed well with no itching or discomfort in this area..  ALLERGIES:  is allergic to crestor [rosuvastatin calcium] and statins.  Meds: Current Outpatient Medications  Medication Sig Dispense Refill  . cholecalciferol (VITAMIN D3) 25 MCG (1000 UNIT) tablet Take 1,000 Units by mouth daily. Pt states she take 2000 units/day    . diphenoxylate-atropine (LOMOTIL) 2.5-0.025 MG tablet Take 1 tablet by mouth daily as needed for diarrhea or loose stools.     . famotidine (PEPCID) 40 MG tablet Take  40 mg by mouth See admin instructions. Five times a week    . fexofenadine (ALLEGRA) 180 MG tablet Take 180 mg by mouth daily.    . fluticasone (FLONASE) 50 MCG/ACT nasal spray Place 2 sprays into both nostrils daily.    Marland Kitchen HYDROcodone-acetaminophen (NORCO/VICODIN) 5-325 MG tablet Take 0.5 tablets by mouth daily as needed (Knee pain).     Marland Kitchen HYDROcodone-homatropine (HYCODAN) 5-1.5 MG/5ML syrup Take 5 mLs by mouth daily as needed for cough.    Marland Kitchen ibuprofen (ADVIL) 200 MG tablet Take 400 mg by mouth every 6 (six) hours as needed (Knee pain).    Marland Kitchen lidocaine (XYLOCAINE) 2 % solution 5 ml swish and spit or swallow Q 3 hours prn oral and esophageal pain 200 mL 2  . LORazepam (ATIVAN) 0.5 MG tablet Take 0.5 mg by mouth at bedtime.     . magic mouthwash SOLN 5 to 10 ml swish and spit or swallow QID prn mouth and esophageal pain 240 mL 2  . melatonin 5 MG TABS Take 5 mg by mouth at bedtime.    Marland Kitchen osimertinib mesylate (TAGRISSO) 80 MG tablet Take 1 tablet (80 mg total) by mouth daily. 30 tablet 2  . oxyCODONE-acetaminophen (PERCOCET/ROXICET) 5-325 MG tablet Take 1 tablet by mouth every 4 (four) hours as needed for severe pain. 45 tablet 0  . oxymetazoline (AFRIN) 0.05 % nasal spray Place 1 spray into both nostrils daily as needed for congestion.    . prochlorperazine (COMPAZINE) 10 MG tablet Take 1 tablet (10 mg  total) by mouth every 6 (six) hours as needed for nausea or vomiting. 30 tablet 0  . RABEprazole (ACIPHEX) 20 MG tablet Take 1 tablet (20 mg total) by mouth 2 (two) times a week.    . sucralfate (CARAFATE) 1 g tablet Take 1 tablet (1 g total) by mouth 4 (four) times daily -  with meals and at bedtime. Crush and dissolve in 10 mL's of warm water prior to swallowing 120 tablet 1  . sucralfate (CARAFATE) 1 GM/10ML suspension Take 10 mLs (1 g total) by mouth 4 (four) times daily -  with meals and at bedtime. 420 mL 1  . SYNTHROID 112 MCG tablet Take 112 mcg by mouth daily before breakfast.     . vitamin B-12  (CYANOCOBALAMIN) 1000 MCG tablet Take 1,000 mcg by mouth daily.    . methylPREDNISolone (MEDROL DOSEPAK) 4 MG TBPK tablet Use as instructed (Patient not taking: Reported on 02/29/2020) 21 tablet 0  . metoprolol succinate (TOPROL XL) 50 MG 24 hr tablet Take 50 mg by mouth daily.      No current facility-administered medications for this encounter.    Physical Findings: The patient is in no acute distress. Patient is alert and oriented.  height is 5\' 5"  (1.651 m) and weight is 164 lb (74.4 kg). Her temperature is 98.2 F (36.8 C). Her blood pressure is 121/74 and her pulse is 75. Her respiration is 18 and oxygen saturation is 97%. No significant changes. Lungs are clear to auscultation bilaterally. Heart has regular rate and rhythm. No palpable cervical, supraclavicular, or axillary adenopathy. Abdomen soft, non-tender, normal bowel sounds.  The left supraclavicular region shows hyperpigmentation changes.  No palpable adenopathy at this time.  Skin is completely healed  Lab Findings: Lab Results  Component Value Date   WBC 3.6 (L) 02/22/2020   HGB 9.4 (L) 02/22/2020   HCT 29.3 (L) 02/22/2020   MCV 92.1 02/22/2020   PLT 163 02/22/2020    Radiographic Findings: No results found.  Impression: Stage IIIC (T3, N3, M0) non-small cell left upper lobe lung cancer, adenocarcinoma  The patient is recovering from the effects of radiation.  Recent chest CT scan shows some response to her treatment.  Fortunately she is a candidate for targeted therapy and has started this and is tolerating well at this time.  Plan: The patient is scheduled to follow up with Dr. Julien Nordmann on 03/08/2020. She will follow up with radiation oncology in as needed basis in light of her close follow-up with medical oncology.   ____________________________________   Blair Promise, PhD, MD  This document serves as a record of services personally performed by Gery Pray, MD. It was created on his behalf by Clerance Lav,  a trained medical scribe. The creation of this record is based on the scribe's personal observations and the provider's statements to them. This document has been checked and approved by the attending provider.

## 2020-02-29 NOTE — Progress Notes (Incomplete)
  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 She did report ongoing pain in her left neck and left cheek, moderate fatigue, a frequent and productive cough with clear phlegm, poor appetite, dull headaches, constipation, and difficulty swallowing for which Carafate was prescribed. She denied shortness of breath, hemoptysis, visual changes, tinnitus, and dizziness.  During the beginning of treatment, she was noted to have some small palpable lymph nodes in the left supraclavicular area that were present prior to starting treatment. As treatment continued, she was noted to have some erythema on the left neck without any skin breakdown. Towards the end of treatment, she was noted to have some mild moist desquamation in the left supraclavicular region without signs of infection.  Plan: The patient will follow-up with radiation oncology in one month.  ________________________________________________   Blair Promise, PhD, MD  This document serves as a record of services personally performed by Gery Pray, MD. It was created on his behalf by Clerance Lav, a trained medical scribe. The creation of this record is based on the scribe's personal observations and the provider's statements to them. This document has been checked and approved by the attending provider.

## 2020-03-01 ENCOUNTER — Encounter: Payer: Self-pay | Admitting: Medical Oncology

## 2020-03-08 ENCOUNTER — Other Ambulatory Visit: Payer: Self-pay

## 2020-03-08 ENCOUNTER — Encounter: Payer: Self-pay | Admitting: Internal Medicine

## 2020-03-08 ENCOUNTER — Inpatient Hospital Stay: Payer: Medicare Other

## 2020-03-08 ENCOUNTER — Inpatient Hospital Stay: Payer: Medicare Other | Admitting: Internal Medicine

## 2020-03-08 ENCOUNTER — Inpatient Hospital Stay (HOSPITAL_BASED_OUTPATIENT_CLINIC_OR_DEPARTMENT_OTHER): Payer: Medicare Other | Admitting: Internal Medicine

## 2020-03-08 VITALS — BP 117/79 | HR 90 | Temp 98.2°F | Resp 18 | Ht 65.0 in | Wt 160.1 lb

## 2020-03-08 DIAGNOSIS — C3492 Malignant neoplasm of unspecified part of left bronchus or lung: Secondary | ICD-10-CM

## 2020-03-08 DIAGNOSIS — Z5111 Encounter for antineoplastic chemotherapy: Secondary | ICD-10-CM | POA: Diagnosis not present

## 2020-03-08 DIAGNOSIS — C3412 Malignant neoplasm of upper lobe, left bronchus or lung: Secondary | ICD-10-CM | POA: Diagnosis not present

## 2020-03-08 DIAGNOSIS — C3490 Malignant neoplasm of unspecified part of unspecified bronchus or lung: Secondary | ICD-10-CM

## 2020-03-08 LAB — CBC WITH DIFFERENTIAL (CANCER CENTER ONLY)
Abs Immature Granulocytes: 0.02 10*3/uL (ref 0.00–0.07)
Basophils Absolute: 0 10*3/uL (ref 0.0–0.1)
Basophils Relative: 1 %
Eosinophils Absolute: 0.1 10*3/uL (ref 0.0–0.5)
Eosinophils Relative: 2 %
HCT: 33.5 % — ABNORMAL LOW (ref 36.0–46.0)
Hemoglobin: 10.4 g/dL — ABNORMAL LOW (ref 12.0–15.0)
Immature Granulocytes: 0 %
Lymphocytes Relative: 13 %
Lymphs Abs: 0.6 10*3/uL — ABNORMAL LOW (ref 0.7–4.0)
MCH: 28.6 pg (ref 26.0–34.0)
MCHC: 31 g/dL (ref 30.0–36.0)
MCV: 92 fL (ref 80.0–100.0)
Monocytes Absolute: 0.7 10*3/uL (ref 0.1–1.0)
Monocytes Relative: 13 %
Neutro Abs: 3.4 10*3/uL (ref 1.7–7.7)
Neutrophils Relative %: 71 %
Platelet Count: 129 10*3/uL — ABNORMAL LOW (ref 150–400)
RBC: 3.64 MIL/uL — ABNORMAL LOW (ref 3.87–5.11)
RDW: 14.9 % (ref 11.5–15.5)
WBC Count: 4.9 10*3/uL (ref 4.0–10.5)
nRBC: 0 % (ref 0.0–0.2)

## 2020-03-08 LAB — CMP (CANCER CENTER ONLY)
ALT: 9 U/L (ref 0–44)
AST: 14 U/L — ABNORMAL LOW (ref 15–41)
Albumin: 3.6 g/dL (ref 3.5–5.0)
Alkaline Phosphatase: 68 U/L (ref 38–126)
Anion gap: 8 (ref 5–15)
BUN: 7 mg/dL — ABNORMAL LOW (ref 8–23)
CO2: 25 mmol/L (ref 22–32)
Calcium: 9 mg/dL (ref 8.9–10.3)
Chloride: 105 mmol/L (ref 98–111)
Creatinine: 0.83 mg/dL (ref 0.44–1.00)
GFR, Estimated: >60 mL/min (ref >60)
Glucose, Bld: 115 mg/dL — ABNORMAL HIGH (ref 70–99)
Potassium: 4.3 mmol/L (ref 3.5–5.1)
Sodium: 138 mmol/L (ref 135–145)
Total Bilirubin: 0.3 mg/dL (ref 0.3–1.2)
Total Protein: 7.5 g/dL (ref 6.5–8.1)

## 2020-03-08 NOTE — Progress Notes (Signed)
    [facility] [phone]   [phone]  OFFICE PROGRESS NOTE  Knowlton, Steve, MD [address] [place] [address]  DIAGNOSIS: Stage IIIc (T3, N3, M0)non-small cell lung cancer, adenocarcinoma presented with large left upper lobe lung mass in addition to left infrahilar and right hilar and subcarinal lymphadenopathy diagnosed in August 2021.  Molecular Biomarkers: Insufficient tissue for foundation 1. Guardant 360 results.  DETECTED ALTERATION(S) / BIOMARKER(S) % CFDNA OR AMPLIFICATION ASSOCIATED FDA-APPROVED THERAPIES CLINICAL TRIAL AVAILABILITY  EGFR L858R, 1.2%, Afatinib, Dacomitinib, Erlotinib, Gefitinib, Osimertinib, Ramucirumab Yes [id number], 1.0%, Afatinib, Dacomitinib, Erlotinib, Gefitinib, Osimertinib, Ramucirumab Yes [id number] 1.0% None  No  PRIOR THERAPY: Weekly concurrent chemoradiation with carboplatin for an AUC of 2 and paclitaxel 45 mg/m2.First dose expected on 11/16/2019. Status post 7 cycles.  CURRENT THERAPY: Tagrisso 80 mg p.o. daily. First dose started 01/31/2020.   INTERVAL HISTORY: Linda Woods 69 y.o. female returns to the clinic today for follow-up visit accompanied by her husband.  The patient is feeling fine today with no concerning complaints.  She denied having any current chest pain, shortness of breath, cough or hemoptysis.  She had no nausea, vomiting and her diarrhea significantly improved with Imodium.  She had right flank pain and she was seen by her urologist and bladder scan and urinalysis were unremarkable.  The patient denied having any recent weight loss or night sweats.  She felt much better after receiving a Medrol Dosepak.  She is here today for evaluation and repeat blood work.    MEDICAL HISTORY: Past Medical History:  Diagnosis Date  . Anemia    as a child  . Anxiety   . Bursitis   . Chronic reflux esophagitis   . Complication of anesthesia   . Diarrhea, functional   .  Diverticulosis   . GERD (gastroesophageal reflux disease)   . History of kidney stones   . Hypertension   . Knee pain    right knee-seeing ortho  . Osteoarthritis   . Plantar fasciitis   . PONV (postoperative nausea and vomiting)    has used the patch before and that helps  . Pure hypercholesterolemia   . RLS (restless legs syndrome)   . Superficial vein thrombosis   . Thyroid disease    hypothyroid    ALLERGIES:  is allergic to crestor [rosuvastatin calcium] and statins.  MEDICATIONS:  Current Outpatient Medications  Medication Sig Dispense Refill  . cholecalciferol (VITAMIN D3) 25 MCG (1000 UNIT) tablet Take 1,000 Units by mouth daily. Pt states she take 2000 units/day    . diphenoxylate-atropine (LOMOTIL) 2.5-0.025 MG tablet Take 1 tablet by mouth daily as needed for diarrhea or loose stools.     . famotidine (PEPCID) 40 MG tablet Take 40 mg by mouth See admin instructions. Five times a week    . fexofenadine (ALLEGRA) 180 MG tablet Take 180 mg by mouth daily.    . fluticasone (FLONASE) 50 MCG/ACT nasal spray Place 2 sprays into both nostrils daily.    . HYDROcodone-acetaminophen (NORCO/VICODIN) 5-325 MG tablet Take 0.5 tablets by mouth daily as needed (Knee pain).     . HYDROcodone-homatropine (HYCODAN) 5-1.5 MG/5ML syrup Take 5 mLs by mouth daily as needed for cough.    . ibuprofen (ADVIL) 200 MG tablet Take 400 mg by mouth every 6 (six) hours as needed (Knee pain).    . lidocaine (XYLOCAINE) 2 % solution 5 ml swish and spit or swallow Q 3 hours prn oral and   esophageal pain 200 mL 2  . LORazepam (ATIVAN) 0.5 MG tablet Take 0.5 mg by mouth at bedtime.     . magic mouthwash SOLN 5 to 10 ml swish and spit or swallow QID prn mouth and esophageal pain 240 mL 2  . melatonin 5 MG TABS Take 5 mg by mouth at bedtime.    . methylPREDNISolone (MEDROL DOSEPAK) 4 MG TBPK tablet Use as instructed (Patient not taking: Reported on 02/29/2020) 21 tablet 0  . metoprolol succinate (TOPROL XL) 50  MG 24 hr tablet Take 50 mg by mouth daily.     . osimertinib mesylate (TAGRISSO) 80 MG tablet Take 1 tablet (80 mg total) by mouth daily. 30 tablet 2  . oxyCODONE-acetaminophen (PERCOCET/ROXICET) 5-325 MG tablet Take 1 tablet by mouth every 4 (four) hours as needed for severe pain. 45 tablet 0  . oxymetazoline (AFRIN) 0.05 % nasal spray Place 1 spray into both nostrils daily as needed for congestion.    . prochlorperazine (COMPAZINE) 10 MG tablet Take 1 tablet (10 mg total) by mouth every 6 (six) hours as needed for nausea or vomiting. 30 tablet 0  . RABEprazole (ACIPHEX) 20 MG tablet Take 1 tablet (20 mg total) by mouth 2 (two) times a week.    . sucralfate (CARAFATE) 1 g tablet Take 1 tablet (1 g total) by mouth 4 (four) times daily -  with meals and at bedtime. Crush and dissolve in 10 mL's of warm water prior to swallowing 120 tablet 1  . sucralfate (CARAFATE) 1 GM/10ML suspension Take 10 mLs (1 g total) by mouth 4 (four) times daily -  with meals and at bedtime. 420 mL 1  . SYNTHROID 112 MCG tablet Take 112 mcg by mouth daily before breakfast.     . vitamin B-12 (CYANOCOBALAMIN) 1000 MCG tablet Take 1,000 mcg by mouth daily.     No current facility-administered medications for this visit.    SURGICAL HISTORY:  Past Surgical History:  Procedure Laterality Date  . APPENDECTOMY    . BRONCHIAL BIOPSY  10/20/2019   Procedure: BRONCHIAL BIOPSIES;  Surgeon: Byrum, Robert S, MD;  Location: [facility];  Service: Pulmonary;;  . BRONCHIAL BRUSHINGS  10/20/2019   Procedure: BRONCHIAL BRUSHINGS;  Surgeon: Byrum, Robert S, MD;  Location: [facility];  Service: Pulmonary;;  . BRONCHIAL NEEDLE ASPIRATION BIOPSY  10/20/2019   Procedure: BRONCHIAL NEEDLE ASPIRATION BIOPSIES;  Surgeon: Byrum, Robert S, MD;  Location: [facility];  Service: Pulmonary;;  . BRONCHIAL WASHINGS  10/20/2019   Procedure: BRONCHIAL WASHINGS;  Surgeon: Byrum, Robert S, MD;  Location: [facility];  Service: Pulmonary;;  . CARPAL  TUNNEL RELEASE Right 2011   Dr. Gramig  . COLPORRHAPHY     posterior  . HAND SURGERY Right 2016   Nerve surgery, Dr. Gramig  . OVARIAN CYST REMOVAL    . RECTOCELE REPAIR  2011   w/TVH and sling  . TONSILLECTOMY    . TONSILLECTOMY    . TOTAL KNEE ARTHROPLASTY Left 09/09/2018   Procedure: TOTAL KNEE ARTHROPLASTY, CORTISONE INJECTION RIGHT KNEE;  Surgeon: Olin, Matthew, MD;  Location: [facility];  Service: Orthopedics;  Laterality: Left;  70 mins  . TOTAL VAGINAL HYSTERECTOMY  10/18/2009   rectocele repair, sling  . TUBAL LIGATION Bilateral   . VARICOSE VEIN SURGERY    . VIDEO BRONCHOSCOPY WITH ENDOBRONCHIAL NAVIGATION N/A 10/20/2019   Procedure: VIDEO BRONCHOSCOPY WITH ENDOBRONCHIAL NAVIGATION;  Surgeon: Byrum, Robert S, MD;  Location: [facility];  Service: Pulmonary;  Laterality: N/A;      REVIEW OF SYSTEMS:  A comprehensive review of systems was negative except for: Constitutional: positive for fatigue Genitourinary: positive for Right flank pain   PHYSICAL EXAMINATION: General appearance: alert, cooperative and no distress Head: Normocephalic, without obvious abnormality, atraumatic Neck: no adenopathy, no JVD, supple, symmetrical, trachea midline and thyroid not enlarged, symmetric, no tenderness/mass/nodules Lymph nodes: Cervical, supraclavicular, and axillary nodes normal. Resp: clear to auscultation bilaterally Back: symmetric, no curvature. ROM normal. No CVA tenderness. Cardio: regular rate and rhythm, S1, S2 normal, no murmur, click, rub or gallop GI: soft, non-tender; bowel sounds normal; no masses,  no organomegaly Extremities: extremities normal, atraumatic, no cyanosis or edema  ECOG PERFORMANCE STATUS: 1 - Symptomatic but completely ambulatory  Blood pressure 117/79, pulse 90, temperature 98.2 F (36.8 C), temperature source Tympanic, resp. rate 18, height 5' 5" (1.651 m), weight 160 lb 1.6 oz (72.6 kg), last menstrual period 08/21/2002, SpO2 98 %.  LABORATORY DATA: Lab  Results  Component Value Date   WBC 4.9 03/08/2020   HGB 10.4 (L) 03/08/2020   HCT 33.5 (L) 03/08/2020   MCV 92.0 03/08/2020   PLT 129 (L) 03/08/2020      Chemistry      Component Value Date/Time   NA 138 02/22/2020 1351   K 3.8 02/22/2020 1351   CL 106 02/22/2020 1351   CO2 27 02/22/2020 1351   BUN 6 (L) 02/22/2020 1351   CREATININE 0.76 02/22/2020 1351      Component Value Date/Time   CALCIUM 8.9 02/22/2020 1351   ALKPHOS 90 02/22/2020 1351   AST 14 (L) 02/22/2020 1351   ALT 11 02/22/2020 1351   BILITOT 0.4 02/22/2020 1351       RADIOGRAPHIC STUDIES: No results found.  ASSESSMENT AND PLAN: This is a very pleasant 70 years old white female recently diagnosed with a stage IIIc/IV (T3, N3, M0/M1a) non-small cell lung cancer, adenocarcinoma presented with large left upper lobe lung mass in addition to left infrahilar, right hilar and subcarinal lymphadenopathy and small left pleural effusion diagnosed in August 2021 with positive EGFR mutation in exon 21 (L858R). The patient completed a course of concurrent chemoradiation with weekly carboplatin and paclitaxel status post 7 cycles.  She tolerated her treatment well except for fatigue as well as a skin burn and cough. Her scan showed mild improvement of the left upper lobe lung mass as well as the mediastinal lymphadenopathy.  The patient continues to have small left pleural effusion that is suspicious given her presentation. She is currently on treatment with Tagrisso 80 mg p.o. daily started on January 31, 2020.   The patient is now tolerating her treatment with Tagrisso much better with less diarrhea. I recommended for her to continue her current treatment with Tagrisso with the same dose. I will see her back for follow-up visit in 1 months with repeat CT scan of the chest for restaging of her disease. The patient was advised to call immediately if she has any other concerning symptoms in the interval.  The patient voices  understanding of current disease status and treatment options and is in agreement with the current care plan.  All questions were answered. The patient knows to call the clinic with any problems, questions or concerns. We can certainly see the patient much sooner if necessary.  Disclaimer: This note was dictated with voice recognition software. Similar sounding words can inadvertently be transcribed and may not be corrected upon review.

## 2020-03-14 ENCOUNTER — Telehealth: Payer: Self-pay | Admitting: Internal Medicine

## 2020-03-14 NOTE — Telephone Encounter (Signed)
Called to inform patient of her upcoming appointment. Patient is aware.

## 2020-03-15 ENCOUNTER — Ambulatory Visit: Payer: Medicare Other

## 2020-03-15 ENCOUNTER — Encounter: Payer: Self-pay | Admitting: Internal Medicine

## 2020-03-16 ENCOUNTER — Telehealth: Payer: Self-pay | Admitting: Internal Medicine

## 2020-03-16 NOTE — Telephone Encounter (Signed)
Rescheduled upcoming appointment per 1/25 schedule message. Patient is aware of changes.

## 2020-03-23 MED FILL — TAGRISSO 80 MG TABLET: 80 | 30 days supply | Qty: 30 | Fill #2

## 2020-04-08 ENCOUNTER — Inpatient Hospital Stay: Payer: Medicare Other | Attending: Internal Medicine

## 2020-04-08 ENCOUNTER — Ambulatory Visit (HOSPITAL_COMMUNITY)
Admission: RE | Admit: 2020-04-08 | Discharge: 2020-04-08 | Disposition: A | Discharge status: Home / Self Care | Payer: Medicare Other | Source: Ambulatory Visit | Attending: Internal Medicine | Admitting: Internal Medicine

## 2020-04-08 ENCOUNTER — Other Ambulatory Visit: Payer: Self-pay

## 2020-04-08 ENCOUNTER — Encounter (HOSPITAL_COMMUNITY): Payer: Self-pay

## 2020-04-08 DIAGNOSIS — I10 Essential (primary) hypertension: Secondary | ICD-10-CM | POA: Diagnosis not present

## 2020-04-08 DIAGNOSIS — C3490 Malignant neoplasm of unspecified part of unspecified bronchus or lung: Secondary | ICD-10-CM | POA: Insufficient documentation

## 2020-04-08 DIAGNOSIS — E039 Hypothyroidism, unspecified: Secondary | ICD-10-CM | POA: Diagnosis not present

## 2020-04-08 DIAGNOSIS — C3412 Malignant neoplasm of upper lobe, left bronchus or lung: Secondary | ICD-10-CM | POA: Diagnosis not present

## 2020-04-08 DIAGNOSIS — J90 Pleural effusion, not elsewhere classified: Secondary | ICD-10-CM | POA: Diagnosis not present

## 2020-04-08 HISTORY — DX: Malignant (primary) neoplasm, unspecified: C80.1

## 2020-04-08 LAB — CMP (CANCER CENTER ONLY)
ALT: 8 U/L (ref 0–44)
AST: 13 U/L — ABNORMAL LOW (ref 15–41)
Albumin: 3.7 g/dL (ref 3.5–5.0)
Alkaline Phosphatase: 73 U/L (ref 38–126)
Anion gap: 6 (ref 5–15)
BUN: 8 mg/dL (ref 8–23)
CO2: 25 mmol/L (ref 22–32)
Calcium: 8.9 mg/dL (ref 8.9–10.3)
Chloride: 109 mmol/L (ref 98–111)
Creatinine: 0.77 mg/dL (ref 0.44–1.00)
GFR, Estimated: >60 mL/min (ref >60)
Glucose, Bld: 109 mg/dL — ABNORMAL HIGH (ref 70–99)
Potassium: 4.4 mmol/L (ref 3.5–5.1)
Sodium: 140 mmol/L (ref 135–145)
Total Bilirubin: 0.4 mg/dL (ref 0.3–1.2)
Total Protein: 6.9 g/dL (ref 6.5–8.1)

## 2020-04-08 LAB — CBC WITH DIFFERENTIAL (CANCER CENTER ONLY)
Abs Immature Granulocytes: 0.01 10*3/uL (ref 0.00–0.07)
Basophils Absolute: 0 10*3/uL (ref 0.0–0.1)
Basophils Relative: 0 %
Eosinophils Absolute: 0.1 10*3/uL (ref 0.0–0.5)
Eosinophils Relative: 4 %
HCT: 33.5 % — ABNORMAL LOW (ref 36.0–46.0)
Hemoglobin: 10.6 g/dL — ABNORMAL LOW (ref 12.0–15.0)
Immature Granulocytes: 0 %
Lymphocytes Relative: 16 %
Lymphs Abs: 0.5 10*3/uL — ABNORMAL LOW (ref 0.7–4.0)
MCH: 28.8 pg (ref 26.0–34.0)
MCHC: 31.6 g/dL (ref 30.0–36.0)
MCV: 91 fL (ref 80.0–100.0)
Monocytes Absolute: 0.4 10*3/uL (ref 0.1–1.0)
Monocytes Relative: 12 %
Neutro Abs: 2.3 10*3/uL (ref 1.7–7.7)
Neutrophils Relative %: 68 %
Platelet Count: 114 10*3/uL — ABNORMAL LOW (ref 150–400)
RBC: 3.68 MIL/uL — ABNORMAL LOW (ref 3.87–5.11)
RDW: 15.5 % (ref 11.5–15.5)
WBC Count: 3.4 10*3/uL — ABNORMAL LOW (ref 4.0–10.5)
nRBC: 0 % (ref 0.0–0.2)

## 2020-04-08 MED ORDER — IOHEXOL 300 MG/ML  SOLN
75.0000 mL | Freq: Once | INTRAMUSCULAR | Status: AC | PRN
  Administered 2020-04-08: 75 mL via INTRAVENOUS

## 2020-04-11 ENCOUNTER — Ambulatory Visit: Payer: Medicare Other | Admitting: Internal Medicine

## 2020-04-13 ENCOUNTER — Telehealth: Payer: Self-pay | Admitting: Internal Medicine

## 2020-04-13 ENCOUNTER — Inpatient Hospital Stay: Payer: Medicare Other | Admitting: Internal Medicine

## 2020-04-13 ENCOUNTER — Encounter: Payer: Self-pay | Admitting: Internal Medicine

## 2020-04-13 ENCOUNTER — Other Ambulatory Visit: Payer: Self-pay

## 2020-04-13 VITALS — BP 134/64 | HR 80 | Temp 97.8°F | Resp 15 | Ht 65.0 in | Wt 164.5 lb

## 2020-04-13 DIAGNOSIS — C3492 Malignant neoplasm of unspecified part of left bronchus or lung: Secondary | ICD-10-CM

## 2020-04-13 DIAGNOSIS — C3412 Malignant neoplasm of upper lobe, left bronchus or lung: Secondary | ICD-10-CM | POA: Diagnosis not present

## 2020-04-13 NOTE — Telephone Encounter (Signed)
Scheduled follow-up appointment per 2/23 los. Patient is aware.

## 2020-04-13 NOTE — Progress Notes (Signed)
[facility] [phone]   [phone]  OFFICE PROGRESS NOTE  [name], MD [place] [address]  DIAGNOSIS: Stage IIIc (T3, N3, M0)non-small cell lung cancer, adenocarcinoma presented with large left upper lobe lung mass in addition to left infrahilar and right hilar and subcarinal lymphadenopathy diagnosed in August 2021.  Molecular Biomarkers: Insufficient tissue for foundation 1. Guardant 360 results.  DETECTED ALTERATION(S) / BIOMARKER(S) % CFDNA OR AMPLIFICATION ASSOCIATED FDA-APPROVED THERAPIES CLINICAL TRIAL AVAILABILITY  EGFR L858R, 1.2%, Afatinib, Dacomitinib, Erlotinib, Gefitinib, Osimertinib, Ramucirumab Yes [id number], 1.0%, Afatinib, Dacomitinib, Erlotinib, Gefitinib, Osimertinib, Ramucirumab Yes [id number] 1.0% None  No  PRIOR THERAPY: Weekly concurrent chemoradiation with carboplatin for an AUC of 2 and paclitaxel 45 mg/m2.First dose expected on 11/16/2019. Status post 7 cycles.  CURRENT THERAPY: Tagrisso 80 mg p.o. daily. First dose started 01/31/2020.  Status post 2 months of treatment.  INTERVAL HISTORY: Linda Woods 70 y.o. female returns to the clinic today for follow-up visit accompanied by her husband.  The patient is feeling fine today with no concerning complaints except for dry cough and shortness of breath with exertion.  Her diarrhea is much controlled with Imodium.  She denied having any current chest pain or hemoptysis.  She denied having any fever or chills.  She has no nausea, vomiting, abdominal pain or constipation.  She has no headache or visual changes.  She continues to tolerate her treatment with Tagrisso fairly well.  The patient had repeat CT scan of the chest performed recently and she is here for evaluation and discussion of her scan results.  MEDICAL HISTORY: Past Medical History:  Diagnosis Date  . Anemia    as a child  . Anxiety   . Bursitis   . Cancer ([place])   .  Chronic reflux esophagitis   . Complication of anesthesia   . Diarrhea, functional   . Diverticulosis   . GERD (gastroesophageal reflux disease)   . History of kidney stones   . Hypertension   . Knee pain    right knee-seeing ortho  . Osteoarthritis   . Plantar fasciitis   . PONV (postoperative nausea and vomiting)    has used the patch before and that helps  . Pure hypercholesterolemia   . RLS (restless legs syndrome)   . Superficial vein thrombosis   . Thyroid disease    hypothyroid    ALLERGIES:  is allergic to crestor [rosuvastatin calcium] and statins.  MEDICATIONS:  Current Outpatient Medications  Medication Sig Dispense Refill  . cholecalciferol (VITAMIN D3) 25 MCG (1000 UNIT) tablet Take 1,000 Units by mouth daily. Pt states she take 2000 units/day    . diphenoxylate-atropine (LOMOTIL) 2.5-0.025 MG tablet Take 1 tablet by mouth daily as needed for diarrhea or loose stools.     . famotidine (PEPCID) 40 MG tablet Take 40 mg by mouth See admin instructions. Five times a week    . fexofenadine (ALLEGRA) 180 MG tablet Take 180 mg by mouth daily.    . fluticasone (FLONASE) 50 MCG/ACT nasal spray Place 2 sprays into both nostrils daily.    Marland Kitchen HYDROcodone-acetaminophen (NORCO/VICODIN) 5-325 MG tablet Take 0.5 tablets by mouth daily as needed (Knee pain).     Marland Kitchen HYDROcodone-homatropine (HYCODAN) 5-1.5 MG/5ML syrup Take 5 mLs by mouth daily as needed for cough.    Marland Kitchen ibuprofen (ADVIL) 200 MG tablet Take 400 mg by mouth every 6 (six) hours as needed (Knee pain).    Marland Kitchen  lidocaine (XYLOCAINE) 2 % solution 5 ml swish and spit or swallow Q 3 hours prn oral and esophageal pain 200 mL 2  . LORazepam (ATIVAN) 0.5 MG tablet Take 0.5 mg by mouth at bedtime.     . magic mouthwash SOLN 5 to 10 ml swish and spit or swallow QID prn mouth and esophageal pain 240 mL 2  . melatonin 5 MG TABS Take 5 mg by mouth at bedtime.    . methylPREDNISolone (MEDROL DOSEPAK) 4 MG TBPK tablet Use as instructed  (Patient not taking: Reported on 02/29/2020) 21 tablet 0  . metoprolol succinate (TOPROL XL) 50 MG 24 hr tablet Take 50 mg by mouth daily.     Marland Kitchen osimertinib mesylate (TAGRISSO) 80 MG tablet Take 1 tablet (80 mg total) by mouth daily. 30 tablet 2  . oxyCODONE-acetaminophen (PERCOCET/ROXICET) 5-325 MG tablet Take 1 tablet by mouth every 4 (four) hours as needed for severe pain. 45 tablet 0  . oxymetazoline (AFRIN) 0.05 % nasal spray Place 1 spray into both nostrils daily as needed for congestion.    . prochlorperazine (COMPAZINE) 10 MG tablet Take 1 tablet (10 mg total) by mouth every 6 (six) hours as needed for nausea or vomiting. 30 tablet 0  . RABEprazole (ACIPHEX) 20 MG tablet Take 1 tablet (20 mg total) by mouth 2 (two) times a week.    . sucralfate (CARAFATE) 1 g tablet Take 1 tablet (1 g total) by mouth 4 (four) times daily -  with meals and at bedtime. Crush and dissolve in 10 mL's of warm water prior to swallowing 120 tablet 1  . sucralfate (CARAFATE) 1 GM/10ML suspension Take 10 mLs (1 g total) by mouth 4 (four) times daily -  with meals and at bedtime. 420 mL 1  . SYNTHROID 112 MCG tablet Take 112 mcg by mouth daily before breakfast.     . vitamin B-12 (CYANOCOBALAMIN) 1000 MCG tablet Take 1,000 mcg by mouth daily.     No current facility-administered medications for this visit.    SURGICAL HISTORY:  Past Surgical History:  Procedure Laterality Date  . APPENDECTOMY    . BRONCHIAL BIOPSY  10/20/2019   Procedure: BRONCHIAL BIOPSIES;  Surgeon: Collene Gobble, MD;  Location: [facility] ENDOSCOPY;  Service: Pulmonary;;  . BRONCHIAL BRUSHINGS  10/20/2019   Procedure: BRONCHIAL BRUSHINGS;  Surgeon: Collene Gobble, MD;  Location: [facility] ENDOSCOPY;  Service: Pulmonary;;  . BRONCHIAL NEEDLE ASPIRATION BIOPSY  10/20/2019   Procedure: BRONCHIAL NEEDLE ASPIRATION BIOPSIES;  Surgeon: Collene Gobble, MD;  Location: [facility];  Service: Pulmonary;;  . BRONCHIAL WASHINGS  10/20/2019   Procedure: BRONCHIAL  WASHINGS;  Surgeon: Collene Gobble, MD;  Location: [facility] ENDOSCOPY;  Service: Pulmonary;;  . CARPAL TUNNEL RELEASE Right 2011   Dr. Amedeo Plenty  . COLPORRHAPHY     posterior  . HAND SURGERY Right 2016   Nerve surgery, Dr. Amedeo Plenty  . OVARIAN CYST REMOVAL    . RECTOCELE REPAIR  2011   w/TVH and sling  . TONSILLECTOMY    . TONSILLECTOMY    . TOTAL KNEE ARTHROPLASTY Left 09/09/2018   Procedure: TOTAL KNEE ARTHROPLASTY, CORTISONE INJECTION RIGHT KNEE;  Surgeon: Paralee Cancel, MD;  Location: [facility] ORS;  Service: Orthopedics;  Laterality: Left;  70 mins  . TOTAL VAGINAL HYSTERECTOMY  10/18/2009   rectocele repair, sling  . TUBAL LIGATION Bilateral   . VARICOSE VEIN SURGERY    . VIDEO BRONCHOSCOPY WITH ENDOBRONCHIAL NAVIGATION N/A 10/20/2019   Procedure: VIDEO BRONCHOSCOPY WITH  ENDOBRONCHIAL NAVIGATION;  Surgeon: Collene Gobble, MD;  Location[facility] ENDOSCOPY;  Service: Pulmonary;  Laterality: N/A;    REVIEW OF SYSTEMS:  Constitutional: negative Eyes: negative Ears, nose, mouth, throat, and face: negative Respiratory: positive for cough and dyspnea on exertion Cardiovascular: negative Gastrointestinal: positive for diarrhea Genitourinary:negative Integument/breast: negative Hematologic/lymphatic: negative Musculoskeletal:negative Neurological: negative Behavioral/Psych: negative Endocrine: negative Allergic/Immunologic: negative   PHYSICAL EXAMINATION: General appearance: alert, cooperative, fatigued and no distress Head: Normocephalic, without obvious abnormality, atraumatic Neck: no adenopathy, no JVD, supple, symmetrical, trachea midline and thyroid not enlarged, symmetric, no tenderness/mass/nodules Lymph nodes: Cervical, supraclavicular, and axillary nodes normal. Resp: clear to auscultation bilaterally Back: symmetric, no curvature. ROM normal. No CVA tenderness. Cardio: regular rate and rhythm, S1, S2 normal, no murmur, click, rub or gallop GI: soft, non-tender; bowel sounds normal; no  masses,  no organomegaly Extremities: extremities normal, atraumatic, no cyanosis or edema Neurologic: Alert and oriented X 3, normal strength and tone. Normal symmetric reflexes. Normal coordination and gait  ECOG PERFORMANCE STATUS: 1 - Symptomatic but completely ambulatory  Blood pressure 134/64, pulse 80, temperature 97.8 F (36.6 C), temperature source Tympanic, resp. rate 15, height '5\' 5"'  (1.651 m), weight 164 lb 8 oz (74.6 kg), last menstrual period 08/21/2002, SpO2 95 %.  LABORATORY DATA: Lab Results  Component Value Date   WBC 3.4 (L) 04/08/2020   HGB 10.6 (L) 04/08/2020   HCT 33.5 (L) 04/08/2020   MCV 91.0 04/08/2020   PLT 114 (L) 04/08/2020      Chemistry      Component Value Date/Time   NA 140 04/08/2020 0932   K 4.4 04/08/2020 0932   CL 109 04/08/2020 0932   CO2 25 04/08/2020 0932   BUN 8 04/08/2020 0932   CREATININE 0.77 04/08/2020 0932      Component Value Date/Time   CALCIUM 8.9 04/08/2020 0932   ALKPHOS 73 04/08/2020 0932   AST 13 (L) 04/08/2020 0932   ALT 8 04/08/2020 0932   BILITOT 0.4 04/08/2020 0932       RADIOGRAPHIC STUDIES: CT Chest W Contrast  Result Date: 04/08/2020 CLINICAL DATA:  Stage III C left upper lobe lung adenocarcinoma status post concurrent chemo radiation therapy with ongoing aerated chemotherapy. Restaging. EXAM: CT CHEST WITH CONTRAST TECHNIQUE: Multidetector CT imaging of the chest was performed during intravenous contrast administration. CONTRAST:  20m OMNIPAQUE IOHEXOL 300 MG/ML  SOLN COMPARISON:  01/26/2020 chest CT.  11/02/2019 PET-CT. FINDINGS: Cardiovascular: Normal heart size. No significant pericardial effusion/thickening. Right coronary atherosclerosis. Atherosclerotic nonaneurysmal thoracic aorta. Normal caliber pulmonary arteries. No central pulmonary emboli. Mediastinum/Nodes: No discrete thyroid nodules. Unremarkable esophagus. No pathologically enlarged axillary, mediastinal or hilar lymph nodes. Lungs/Pleura: No  pneumothorax. Trace dependent right pleural effusion, new. Small dependent left pleural effusion, increased. Increased sharply marginated extensive left perihilar lung consolidation with increased associated volume loss, distortion and bronchiectasis, compatible with evolving postradiation change, encompassing the previously measured left upper lobe lung mass, which is no longer discretely visualized. Similar increased extensive sharply marginated patchy right perihilar lung consolidation with associated increasing volume loss, distortion and bronchiectasis, compatible with evolving post radiation change. Small patchy foci ground-glass opacity including in the anterior right middle lobe (series 5/image 99) are increased and also probably treatment related, warranting continued chest CT surveillance. Numerous tiny centrilobular solid nodules scattered throughout the right lung, most prominent in peripheral right upper lobe measuring up to 3 mm (series 5/image 45), not appreciably changed. No new discrete pulmonary nodules. Upper abdomen: Small hiatal hernia. Right adrenal  1.2 cm nodule with density 76 HU, stable, previously non hypermetabolic. Partially visualize simple 3.5 cm right renal cyst. Left colonic diverticulosis. Musculoskeletal: Subcentimeter sclerotic T10 and T11 vertebral lesions are stable since 01/26/2020 chest CT and new since 10/14/2019 chest CT. No appreciable new interval bone lesions. Mild thoracic spondylosis. IMPRESSION: 1. Subcentimeter sclerotic T10 and T11 vertebral lesions are stable since 01/26/2020 chest CT but new since 10/14/2019 chest CT, probably small stable bone metastases. No new interval thoracic bone lesions. 2. Evolving postradiation change in the perihilar lungs bilaterally. Previously measured left upper lobe lung mass is encompassed by the left perihilar radiation change and no longer discretely visualized. 3. Small patchy foci of ground-glass opacity in the right lung, most  prominent in the anterior right middle lobe, are new/increased and also probably treatment related, warranting continued chest CT surveillance. 4. Numerous scattered tiny centrilobular nodules in the right lung, largest 3 mm in the right upper lobe, not appreciably changed, warranting continued chest CT surveillance. 5. No pathologically enlarged thoracic lymph nodes. 6. Trace dependent right pleural effusion, new. Small dependent left pleural effusion, increased. 7. Aortic Atherosclerosis (ICD10-I70.0). Electronically Signed   By: Ilona Sorrel M.D.   On: 04/08/2020 13:55    ASSESSMENT AND PLAN: This is a very pleasant 70 years old white female recently diagnosed with a stage IIIc/IV (T3, N3, M0/M1a) non-small cell lung cancer, adenocarcinoma presented with large left upper lobe lung mass in addition to left infrahilar, right hilar and subcarinal lymphadenopathy and small left pleural effusion diagnosed in August 2021 with positive EGFR mutation in exon 21 (L858R). The patient completed a course of concurrent chemoradiation with weekly carboplatin and paclitaxel status post 7 cycles.  She tolerated her treatment well except for fatigue as well as a skin burn and cough. Her scan showed mild improvement of the left upper lobe lung mass as well as the mediastinal lymphadenopathy.  The patient continues to have small left pleural effusion that is suspicious given her presentation. She is currently on treatment with Tagrisso 80 mg p.o. daily started on January 31, 2020.   The patient continues to tolerate her treatment well except for infrequent diarrhea improved with Imodium. She had repeat CT scan of the chest performed recently.  I personally and independently reviewed the scans and discussed the results with the patient and her husband. Her scan showed no concerning findings for disease progression and she continues to have the radiation-induced changes. I recommended for her to continue her current  treatment with Tagrisso with the same dose for now. For the dry cough she will take Delsym over-the-counter. The patient will come back for follow-up visit in 1 months for evaluation and repeat blood work. She was advised to call immediately if she has any concerning symptoms in the interval. The patient voices understanding of current disease status and treatment options and is in agreement with the current care plan.  All questions were answered. The patient knows to call the clinic with any problems, questions or concerns. We can certainly see the patient much sooner if necessary.  Disclaimer: This note was dictated with voice recognition software. Similar sounding words can inadvertently be transcribed and may not be corrected upon review.

## 2020-04-15 ENCOUNTER — Other Ambulatory Visit: Payer: Self-pay | Admitting: Internal Medicine

## 2020-04-15 DIAGNOSIS — C3492 Malignant neoplasm of unspecified part of left bronchus or lung: Secondary | ICD-10-CM

## 2020-04-17 ENCOUNTER — Other Ambulatory Visit: Payer: Self-pay | Admitting: Internal Medicine

## 2020-04-27 MED FILL — TAGRISSO 80 MG TABLET: 80 | 30 days supply | Qty: 30 | Fill #0

## 2020-05-11 ENCOUNTER — Other Ambulatory Visit: Payer: Self-pay

## 2020-05-11 ENCOUNTER — Inpatient Hospital Stay: Payer: Medicare Other | Attending: Internal Medicine | Admitting: Internal Medicine

## 2020-05-11 ENCOUNTER — Inpatient Hospital Stay: Payer: Medicare Other

## 2020-05-11 VITALS — BP 129/50 | HR 77 | Temp 97.9°F | Resp 14 | Ht 65.0 in | Wt 159.8 lb

## 2020-05-11 DIAGNOSIS — C3412 Malignant neoplasm of upper lobe, left bronchus or lung: Secondary | ICD-10-CM | POA: Insufficient documentation

## 2020-05-11 DIAGNOSIS — Z923 Personal history of irradiation: Secondary | ICD-10-CM | POA: Insufficient documentation

## 2020-05-11 DIAGNOSIS — Z7952 Long term (current) use of systemic steroids: Secondary | ICD-10-CM | POA: Diagnosis not present

## 2020-05-11 DIAGNOSIS — Z5111 Encounter for antineoplastic chemotherapy: Secondary | ICD-10-CM

## 2020-05-11 DIAGNOSIS — Z79899 Other long term (current) drug therapy: Secondary | ICD-10-CM | POA: Insufficient documentation

## 2020-05-11 DIAGNOSIS — I10 Essential (primary) hypertension: Secondary | ICD-10-CM | POA: Insufficient documentation

## 2020-05-11 DIAGNOSIS — C3492 Malignant neoplasm of unspecified part of left bronchus or lung: Secondary | ICD-10-CM | POA: Diagnosis not present

## 2020-05-11 DIAGNOSIS — E039 Hypothyroidism, unspecified: Secondary | ICD-10-CM | POA: Insufficient documentation

## 2020-05-11 LAB — CMP (CANCER CENTER ONLY)
ALT: 11 U/L (ref 0–44)
AST: 13 U/L — ABNORMAL LOW (ref 15–41)
Albumin: 4 g/dL (ref 3.5–5.0)
Alkaline Phosphatase: 69 U/L (ref 38–126)
Anion gap: 9 (ref 5–15)
BUN: 12 mg/dL (ref 8–23)
CO2: 25 mmol/L (ref 22–32)
Calcium: 9.1 mg/dL (ref 8.9–10.3)
Chloride: 109 mmol/L (ref 98–111)
Creatinine: 0.8 mg/dL (ref 0.44–1.00)
GFR, Estimated: >60 mL/min (ref >60)
Glucose, Bld: 74 mg/dL (ref 70–99)
Potassium: 4 mmol/L (ref 3.5–5.1)
Sodium: 143 mmol/L (ref 135–145)
Total Bilirubin: 0.4 mg/dL (ref 0.3–1.2)
Total Protein: 7.1 g/dL (ref 6.5–8.1)

## 2020-05-11 LAB — CBC WITH DIFFERENTIAL (CANCER CENTER ONLY)
Abs Immature Granulocytes: 0.01 10*3/uL (ref 0.00–0.07)
Basophils Absolute: 0 10*3/uL (ref 0.0–0.1)
Basophils Relative: 0 %
Eosinophils Absolute: 0.1 10*3/uL (ref 0.0–0.5)
Eosinophils Relative: 3 %
HCT: 36 % (ref 36.0–46.0)
Hemoglobin: 11.5 g/dL — ABNORMAL LOW (ref 12.0–15.0)
Immature Granulocytes: 0 %
Lymphocytes Relative: 16 %
Lymphs Abs: 0.6 10*3/uL — ABNORMAL LOW (ref 0.7–4.0)
MCH: 29.2 pg (ref 26.0–34.0)
MCHC: 31.9 g/dL (ref 30.0–36.0)
MCV: 91.4 fL (ref 80.0–100.0)
Monocytes Absolute: 0.6 10*3/uL (ref 0.1–1.0)
Monocytes Relative: 16 %
Neutro Abs: 2.2 10*3/uL (ref 1.7–7.7)
Neutrophils Relative %: 65 %
Platelet Count: 105 10*3/uL — ABNORMAL LOW (ref 150–400)
RBC: 3.94 MIL/uL (ref 3.87–5.11)
RDW: 14.6 % (ref 11.5–15.5)
WBC Count: 3.5 10*3/uL — ABNORMAL LOW (ref 4.0–10.5)
nRBC: 0 % (ref 0.0–0.2)

## 2020-05-11 NOTE — Progress Notes (Signed)
[place] [phone]   [phone]  OFFICE PROGRESS NOTE  [name], MD [place] [address]  DIAGNOSIS: Stage IIIc (T3, N3, M0)non-small cell lung cancer, adenocarcinoma presented with large left upper lobe lung mass in addition to left infrahilar and right hilar and subcarinal lymphadenopathy diagnosed in August 2021.  Molecular Biomarkers: Insufficient tissue for foundation 1. Guardant 360 results.  DETECTED ALTERATION(S) / BIOMARKER(S) % CFDNA OR AMPLIFICATION ASSOCIATED FDA-APPROVED THERAPIES CLINICAL TRIAL AVAILABILITY  EGFR L858R, 1.2%, Afatinib, Dacomitinib, Erlotinib, Gefitinib, Osimertinib, Ramucirumab Yes EGFRL833V, 1.0%, Afatinib, Dacomitinib, Erlotinib, Gefitinib, Osimertinib, Ramucirumab Yes [id number] 1.0% None  No  PRIOR THERAPY: Weekly concurrent chemoradiation with carboplatin for an AUC of 2 and paclitaxel 45 mg/m2.First dose expected on 11/16/2019. Status post 7 cycles.  CURRENT THERAPY: Tagrisso 80 mg p.o. daily. First dose started 01/31/2020.  Status post 3 months of treatment.  INTERVAL HISTORY: Linda Woods 70 y.o. female returns to the clinic today for follow-up visit accompanied by her husband.  The patient is feeling fine today with no concerning complaints except for few episodes of cough as well as diarrhea.  She denied having any shortness of breath except with exertion with occasional left-sided chest pain and no hemoptysis.  She has no recent weight loss or night sweats.  She has no nausea, vomiting, diarrhea or constipation.  She has few areas of skin rash.  She denied having any headache or visual changes.  The patient is here today for evaluation and repeat blood work.  MEDICAL HISTORY: Past Medical History:  Diagnosis Date  . Anemia    as a child  . Anxiety   . Bursitis   . Cancer ([place])   . Chronic reflux esophagitis   . Complication of anesthesia   . Diarrhea,  functional   . Diverticulosis   . GERD (gastroesophageal reflux disease)   . History of kidney stones   . Hypertension   . Knee pain    right knee-seeing ortho  . Osteoarthritis   . Plantar fasciitis   . PONV (postoperative nausea and vomiting)    has used the patch before and that helps  . Pure hypercholesterolemia   . RLS (restless legs syndrome)   . Superficial vein thrombosis   . Thyroid disease    hypothyroid    ALLERGIES:  is allergic to crestor [rosuvastatin calcium] and statins.  MEDICATIONS:  Current Outpatient Medications  Medication Sig Dispense Refill  . cholecalciferol (VITAMIN D3) 25 MCG (1000 UNIT) tablet Take 1,000 Units by mouth daily. Pt states she take 2000 units/day    . diphenoxylate-atropine (LOMOTIL) 2.5-0.025 MG tablet Take 1 tablet by mouth daily as needed for diarrhea or loose stools.     . famotidine (PEPCID) 40 MG tablet Take 40 mg by mouth See admin instructions. Five times a week    . fexofenadine (ALLEGRA) 180 MG tablet Take 180 mg by mouth daily.    . fluticasone (FLONASE) 50 MCG/ACT nasal spray Place 2 sprays into both nostrils daily.    Marland Kitchen HYDROcodone-acetaminophen (NORCO/VICODIN) 5-325 MG tablet Take 0.5 tablets by mouth daily as needed (Knee pain).     Marland Kitchen HYDROcodone-homatropine (HYCODAN) 5-1.5 MG/5ML syrup Take 5 mLs by mouth daily as needed for cough.    Marland Kitchen ibuprofen (ADVIL) 200 MG tablet Take 400 mg by mouth every 6 (six) hours as needed (Knee pain).    Marland Kitchen lidocaine (XYLOCAINE) 2 % solution 5 ml swish and spit or swallow  Q 3 hours prn oral and esophageal pain 200 mL 2  . LORazepam (ATIVAN) 0.5 MG tablet Take 0.5 mg by mouth at bedtime.     . magic mouthwash SOLN 5 to 10 ml swish and spit or swallow QID prn mouth and esophageal pain 240 mL 2  . melatonin 5 MG TABS Take 5 mg by mouth at bedtime.    . methylPREDNISolone (MEDROL DOSEPAK) 4 MG TBPK tablet Use as instructed (Patient not taking: Reported on 02/29/2020) 21 tablet 0  . metoprolol succinate  (TOPROL XL) 50 MG 24 hr tablet Take 50 mg by mouth daily.     Marland Kitchen oxyCODONE-acetaminophen (PERCOCET/ROXICET) 5-325 MG tablet Take 1 tablet by mouth every 4 (four) hours as needed for severe pain. 45 tablet 0  . oxymetazoline (AFRIN) 0.05 % nasal spray Place 1 spray into both nostrils daily as needed for congestion.    Marland Kitchen PREMARIN vaginal cream Place vaginally.    . prochlorperazine (COMPAZINE) 10 MG tablet Take 1 tablet (10 mg total) by mouth every 6 (six) hours as needed for nausea or vomiting. 30 tablet 0  . RABEprazole (ACIPHEX) 20 MG tablet Take 1 tablet (20 mg total) by mouth 2 (two) times a week.    . sucralfate (CARAFATE) 1 g tablet Take 1 tablet (1 g total) by mouth 4 (four) times daily -  with meals and at bedtime. Crush and dissolve in 10 mL's of warm water prior to swallowing 120 tablet 1  . sucralfate (CARAFATE) 1 GM/10ML suspension Take 10 mLs (1 g total) by mouth 4 (four) times daily -  with meals and at bedtime. 420 mL 1  . SYNTHROID 112 MCG tablet Take 112 mcg by mouth daily before breakfast.     . TAGRISSO 80 MG tablet TAKE 1 TABLET BY MOUTH DAILY. 30 tablet 2  . vitamin B-12 (CYANOCOBALAMIN) 1000 MCG tablet Take 1,000 mcg by mouth daily.     No current facility-administered medications for this visit.    SURGICAL HISTORY:  Past Surgical History:  Procedure Laterality Date  . APPENDECTOMY    . BRONCHIAL BIOPSY  10/20/2019   Procedure: BRONCHIAL BIOPSIES;  Surgeon: Collene Gobble, MD;  Location: [facility] ENDOSCOPY;  Service: Pulmonary;;  . BRONCHIAL BRUSHINGS  10/20/2019   Procedure: BRONCHIAL BRUSHINGS;  Surgeon: Collene Gobble, MD;  Location[facility] ENDOSCOPY;  Service: Pulmonary;;  . BRONCHIAL NEEDLE ASPIRATION BIOPSY  10/20/2019   Procedure: BRONCHIAL NEEDLE ASPIRATION BIOPSIES;  Surgeon: Collene Gobble, MD;  Location: [place];  Service: Pulmonary;;  . BRONCHIAL WASHINGS  10/20/2019   Procedure: BRONCHIAL WASHINGS;  Surgeon: Collene Gobble, MD;  Location: [facility] ENDOSCOPY;  Service:  Pulmonary;;  . CARPAL TUNNEL RELEASE Right 2011   Dr. Amedeo Plenty  . COLPORRHAPHY     posterior  . HAND SURGERY Right 2016   Nerve surgery, Dr. Amedeo Plenty  . OVARIAN CYST REMOVAL    . RECTOCELE REPAIR  2011   w/TVH and sling  . TONSILLECTOMY    . TONSILLECTOMY    . TOTAL KNEE ARTHROPLASTY Left 09/09/2018   Procedure: TOTAL KNEE ARTHROPLASTY, CORTISONE INJECTION RIGHT KNEE;  Surgeon: Paralee Cancel, MD;  Location: [facility] ORS;  Service: Orthopedics;  Laterality: Left;  70 mins  . TOTAL VAGINAL HYSTERECTOMY  10/18/2009   rectocele repair, sling  . TUBAL LIGATION Bilateral   . VARICOSE VEIN SURGERY    . VIDEO BRONCHOSCOPY WITH ENDOBRONCHIAL NAVIGATION N/A 10/20/2019   Procedure: VIDEO BRONCHOSCOPY WITH ENDOBRONCHIAL NAVIGATION;  Surgeon: Collene Gobble, MD;  Location: [facility] ENDOSCOPY;  Service: Pulmonary;  Laterality: N/A;    REVIEW OF SYSTEMS:  A comprehensive review of systems was negative except for: Constitutional: positive for fatigue Respiratory: positive for cough and pleurisy/chest pain Gastrointestinal: positive for diarrhea Integument/breast: positive for rash   PHYSICAL EXAMINATION: General appearance: alert, cooperative, fatigued and no distress Head: Normocephalic, without obvious abnormality, atraumatic Neck: no adenopathy, no JVD, supple, symmetrical, trachea midline and thyroid not enlarged, symmetric, no tenderness/mass/nodules Lymph nodes: Cervical, supraclavicular, and axillary nodes normal. Resp: clear to auscultation bilaterally Back: symmetric, no curvature. ROM normal. No CVA tenderness. Cardio: regular rate and rhythm, S1, S2 normal, no murmur, click, rub or gallop GI: soft, non-tender; bowel sounds normal; no masses,  no organomegaly Extremities: extremities normal, atraumatic, no cyanosis or edema  ECOG PERFORMANCE STATUS: 1 - Symptomatic but completely ambulatory  Blood pressure (!) 129/50, pulse 77, temperature 97.9 F (36.6 C), temperature source Oral, resp. rate 14,  height $Remov'5\' 5"'JHFJtZ$  (1.651 m), weight 159 lb 12.8 oz (72.5 kg), last menstrual period 08/21/2002, SpO2 98 %.  LABORATORY DATA: Lab Results  Component Value Date   WBC 3.5 (L) 05/11/2020   HGB 11.5 (L) 05/11/2020   HCT 36.0 05/11/2020   MCV 91.4 05/11/2020   PLT 105 (L) 05/11/2020      Chemistry      Component Value Date/Time   NA 140 04/08/2020 0932   K 4.4 04/08/2020 0932   CL 109 04/08/2020 0932   CO2 25 04/08/2020 0932   BUN 8 04/08/2020 0932   CREATININE 0.77 04/08/2020 0932      Component Value Date/Time   CALCIUM 8.9 04/08/2020 0932   ALKPHOS 73 04/08/2020 0932   AST 13 (L) 04/08/2020 0932   ALT 8 04/08/2020 0932   BILITOT 0.4 04/08/2020 0932       RADIOGRAPHIC STUDIES: No results found.  ASSESSMENT AND PLAN: This is a very pleasant 70 years old white female recently diagnosed with a stage IIIc/IV (T3, N3, M0/M1a) non-small cell lung cancer, adenocarcinoma presented with large left upper lobe lung mass in addition to left infrahilar, right hilar and subcarinal lymphadenopathy and small left pleural effusion diagnosed in August 2021 with positive EGFR mutation in exon 21 (L858R). The patient completed a course of concurrent chemoradiation with weekly carboplatin and paclitaxel status post 7 cycles.  She tolerated her treatment well except for fatigue as well as a skin burn and cough. Her scan showed mild improvement of the left upper lobe lung mass as well as the mediastinal lymphadenopathy.  The patient continues to have small left pleural effusion that is suspicious given her presentation. She is currently on treatment with Tagrisso 80 mg p.o. daily started on January 31, 2020.  Status post 3 months of treatment. The patient has been tolerating this treatment well with no concerning adverse effects.  She is concerned about the bone finding on the last scan which are sclerotic in nature and could have been there before and becomes more apparent with the treatment with Tagrisso.   I recommended for the patient to continue her current treatment with Tagrisso with the same dose. For the skin rash she will apply hydrocortisone cream to the involved area. For the diarrhea she will use Imodium on as-needed basis. The patient will come back for follow-up visit in 1 months for evaluation and repeat blood work. She was advised to call immediately if she has any concerning symptoms in the interval. The patient voices understanding of current disease status and treatment  options and is in agreement with the current care plan.  All questions were answered. The patient knows to call the clinic with any problems, questions or concerns. We can certainly see the patient much sooner if necessary.  Disclaimer: This note was dictated with voice recognition software. Similar sounding words can inadvertently be transcribed and may not be corrected upon review.

## 2020-05-13 ENCOUNTER — Other Ambulatory Visit (HOSPITAL_COMMUNITY): Payer: Self-pay

## 2020-05-21 ENCOUNTER — Other Ambulatory Visit (HOSPITAL_COMMUNITY): Payer: Self-pay

## 2020-05-21 MED FILL — Osimertinib Mesylate Tab 80 MG (Base Equivalent): ORAL | 30 days supply | Qty: 30 | Fill #0 | Status: AC

## 2020-05-23 ENCOUNTER — Other Ambulatory Visit (HOSPITAL_COMMUNITY): Payer: Self-pay

## 2020-05-24 ENCOUNTER — Other Ambulatory Visit (HOSPITAL_COMMUNITY): Payer: Self-pay

## 2020-05-30 ENCOUNTER — Other Ambulatory Visit (HOSPITAL_COMMUNITY): Payer: Self-pay

## 2020-06-08 ENCOUNTER — Other Ambulatory Visit: Payer: Self-pay

## 2020-06-08 ENCOUNTER — Encounter: Payer: Self-pay | Admitting: Internal Medicine

## 2020-06-08 ENCOUNTER — Inpatient Hospital Stay (HOSPITAL_BASED_OUTPATIENT_CLINIC_OR_DEPARTMENT_OTHER): Payer: Medicare Other | Admitting: Internal Medicine

## 2020-06-08 ENCOUNTER — Inpatient Hospital Stay: Payer: Medicare Other | Attending: Internal Medicine

## 2020-06-08 VITALS — BP 127/65 | HR 76 | Temp 97.5°F | Resp 17 | Ht 65.0 in | Wt 161.2 lb

## 2020-06-08 DIAGNOSIS — E7800 Pure hypercholesterolemia, unspecified: Secondary | ICD-10-CM | POA: Insufficient documentation

## 2020-06-08 DIAGNOSIS — R197 Diarrhea, unspecified: Secondary | ICD-10-CM | POA: Insufficient documentation

## 2020-06-08 DIAGNOSIS — M1990 Unspecified osteoarthritis, unspecified site: Secondary | ICD-10-CM | POA: Insufficient documentation

## 2020-06-08 DIAGNOSIS — C3492 Malignant neoplasm of unspecified part of left bronchus or lung: Secondary | ICD-10-CM

## 2020-06-08 DIAGNOSIS — R062 Wheezing: Secondary | ICD-10-CM | POA: Insufficient documentation

## 2020-06-08 DIAGNOSIS — M549 Dorsalgia, unspecified: Secondary | ICD-10-CM | POA: Diagnosis not present

## 2020-06-08 DIAGNOSIS — K219 Gastro-esophageal reflux disease without esophagitis: Secondary | ICD-10-CM | POA: Insufficient documentation

## 2020-06-08 DIAGNOSIS — D649 Anemia, unspecified: Secondary | ICD-10-CM | POA: Diagnosis not present

## 2020-06-08 DIAGNOSIS — R059 Cough, unspecified: Secondary | ICD-10-CM | POA: Diagnosis not present

## 2020-06-08 DIAGNOSIS — E039 Hypothyroidism, unspecified: Secondary | ICD-10-CM | POA: Diagnosis not present

## 2020-06-08 DIAGNOSIS — C3412 Malignant neoplasm of upper lobe, left bronchus or lung: Secondary | ICD-10-CM | POA: Insufficient documentation

## 2020-06-08 DIAGNOSIS — I10 Essential (primary) hypertension: Secondary | ICD-10-CM | POA: Diagnosis not present

## 2020-06-08 DIAGNOSIS — Z79899 Other long term (current) drug therapy: Secondary | ICD-10-CM | POA: Insufficient documentation

## 2020-06-08 DIAGNOSIS — Z923 Personal history of irradiation: Secondary | ICD-10-CM | POA: Diagnosis not present

## 2020-06-08 DIAGNOSIS — Z9221 Personal history of antineoplastic chemotherapy: Secondary | ICD-10-CM | POA: Insufficient documentation

## 2020-06-08 LAB — CBC WITH DIFFERENTIAL (CANCER CENTER ONLY)
Abs Immature Granulocytes: 0.01 10*3/uL (ref 0.00–0.07)
Basophils Absolute: 0 10*3/uL (ref 0.0–0.1)
Basophils Relative: 0 %
Eosinophils Absolute: 0.1 10*3/uL (ref 0.0–0.5)
Eosinophils Relative: 2 %
HCT: 35.3 % — ABNORMAL LOW (ref 36.0–46.0)
Hemoglobin: 11.5 g/dL — ABNORMAL LOW (ref 12.0–15.0)
Immature Granulocytes: 0 %
Lymphocytes Relative: 16 %
Lymphs Abs: 0.7 10*3/uL (ref 0.7–4.0)
MCH: 30.2 pg (ref 26.0–34.0)
MCHC: 32.6 g/dL (ref 30.0–36.0)
MCV: 92.7 fL (ref 80.0–100.0)
Monocytes Absolute: 0.4 10*3/uL (ref 0.1–1.0)
Monocytes Relative: 10 %
Neutro Abs: 3 10*3/uL (ref 1.7–7.7)
Neutrophils Relative %: 72 %
Platelet Count: 105 10*3/uL — ABNORMAL LOW (ref 150–400)
RBC: 3.81 MIL/uL — ABNORMAL LOW (ref 3.87–5.11)
RDW: 13.8 % (ref 11.5–15.5)
WBC Count: 4.2 10*3/uL (ref 4.0–10.5)
nRBC: 0 % (ref 0.0–0.2)

## 2020-06-08 LAB — CMP (CANCER CENTER ONLY)
ALT: 11 U/L (ref 0–44)
AST: 15 U/L (ref 15–41)
Albumin: 4.1 g/dL (ref 3.5–5.0)
Alkaline Phosphatase: 65 U/L (ref 38–126)
Anion gap: 8 (ref 5–15)
BUN: 11 mg/dL (ref 8–23)
CO2: 27 mmol/L (ref 22–32)
Calcium: 9.4 mg/dL (ref 8.9–10.3)
Chloride: 106 mmol/L (ref 98–111)
Creatinine: 0.98 mg/dL (ref 0.44–1.00)
GFR, Estimated: >60 mL/min (ref >60)
Glucose, Bld: 118 mg/dL — ABNORMAL HIGH (ref 70–99)
Potassium: 4.5 mmol/L (ref 3.5–5.1)
Sodium: 141 mmol/L (ref 135–145)
Total Bilirubin: 0.3 mg/dL (ref 0.3–1.2)
Total Protein: 6.8 g/dL (ref 6.5–8.1)

## 2020-06-08 MED ORDER — ALBUTEROL SULFATE HFA 108 (90 BASE) MCG/ACT IN AERS: 2.0000 | INHALATION_SPRAY | Freq: Four times a day (QID) | RESPIRATORY_TRACT | 0 refills | Status: DC | PRN

## 2020-06-08 NOTE — Progress Notes (Signed)
[place] [phone]   Fax:(336) [phone]  OFFICE PROGRESS NOTE  [name], MD [place] [address]  DIAGNOSIS: Stage IIIc (T3, N3, M0)non-small cell lung cancer, adenocarcinoma presented with large left upper lobe lung mass in addition to left infrahilar and right hilar and subcarinal lymphadenopathy diagnosed in August 2021.  Molecular Biomarkers: Insufficient tissue for foundation 1. Guardant 360 results.  DETECTED ALTERATION(S) / BIOMARKER(S) % CFDNA OR AMPLIFICATION ASSOCIATED FDA-APPROVED THERAPIES CLINICAL TRIAL AVAILABILITY  EGFR L858R, 1.2%, Afatinib, Dacomitinib, Erlotinib, Gefitinib, Osimertinib, Ramucirumab Yes EGFRL833V, 1.0%, Afatinib, Dacomitinib, Erlotinib, Gefitinib, Osimertinib, Ramucirumab Yes RHOAE40K 1.0% None  No  PRIOR THERAPY: Weekly concurrent chemoradiation with carboplatin for an AUC of 2 and paclitaxel 45 mg/m2.First dose expected on 11/16/2019. Status post 7 cycles.  CURRENT THERAPY: Tagrisso 80 mg p.o. daily. First dose started 01/31/2020.  Status post 4 months of treatment.  INTERVAL HISTORY: Linda Woods 70 y.o. female returns to the clinic today for 1 month follow-up visit.  The patient is feeling fine today with no concerning complaints except for intermittent back pain.  Her diarrhea is better controlled these days.  She denied having any current chest pain, shortness of breath, cough or hemoptysis.  She denied having any fever or chills.  She has no nausea, vomiting, diarrhea or constipation.  She has no weight loss or night sweats.  She is here today for evaluation and repeat blood work.   MEDICAL HISTORY: Past Medical History:  Diagnosis Date  . Anemia    as a child  . Anxiety   . Bursitis   . Cancer ([place])   . Chronic reflux esophagitis   . Complication of anesthesia   . Diarrhea, functional   . Diverticulosis   . GERD (gastroesophageal reflux disease)   . History  of kidney stones   . Hypertension   . Knee pain    right knee-seeing ortho  . Osteoarthritis   . Plantar fasciitis   . PONV (postoperative nausea and vomiting)    has used the patch before and that helps  . Pure hypercholesterolemia   . RLS (restless legs syndrome)   . Superficial vein thrombosis   . Thyroid disease    hypothyroid    ALLERGIES:  is allergic to crestor [rosuvastatin calcium] and statins.  MEDICATIONS:  Current Outpatient Medications  Medication Sig Dispense Refill  . cholecalciferol (VITAMIN D3) 25 MCG (1000 UNIT) tablet Take 1,000 Units by mouth daily. Pt states she take 2000 units/day    . diphenoxylate-atropine (LOMOTIL) 2.5-0.025 MG tablet Take 1 tablet by mouth daily as needed for diarrhea or loose stools.     . famotidine (PEPCID) 40 MG tablet Take 40 mg by mouth See admin instructions. Five times a week    . fexofenadine (ALLEGRA) 180 MG tablet Take 180 mg by mouth daily.    . fluticasone (FLONASE) 50 MCG/ACT nasal spray Place 2 sprays into both nostrils daily.    Marland Kitchen HYDROcodone-acetaminophen (NORCO/VICODIN) 5-325 MG tablet Take 0.5 tablets by mouth daily as needed (Knee pain).     Marland Kitchen HYDROcodone-homatropine (HYCODAN) 5-1.5 MG/5ML syrup Take 5 mLs by mouth daily as needed for cough.    Marland Kitchen ibuprofen (ADVIL) 200 MG tablet Take 400 mg by mouth every 6 (six) hours as needed (Knee pain).    Marland Kitchen lidocaine (LIDODERM) 5 % 1 patch daily.    Marland Kitchen LORazepam (ATIVAN) 0.5 MG tablet Take 0.5 mg by mouth at bedtime.     Marland Kitchen  magic mouthwash SOLN 5 to 10 ml swish and spit or swallow QID prn mouth and esophageal pain 240 mL 2  . melatonin 5 MG TABS Take 5 mg by mouth at bedtime.    . methylPREDNISolone (MEDROL DOSEPAK) 4 MG TBPK tablet Use as instructed 21 tablet 0  . metoprolol succinate (TOPROL XL) 50 MG 24 hr tablet Take 50 mg by mouth daily.     Marland Kitchen osimertinib mesylate (TAGRISSO) 80 MG tablet TAKE 1 TABLET BY MOUTH DAILY. 30 tablet 2  . oxyCODONE-acetaminophen (PERCOCET/ROXICET)  5-325 MG tablet Take 1 tablet by mouth every 4 (four) hours as needed for severe pain. 45 tablet 0  . oxymetazoline (AFRIN) 0.05 % nasal spray Place 1 spray into both nostrils daily as needed for congestion.    Marland Kitchen PREMARIN vaginal cream Place vaginally.    . prochlorperazine (COMPAZINE) 10 MG tablet Take 1 tablet (10 mg total) by mouth every 6 (six) hours as needed for nausea or vomiting. 30 tablet 0  . RABEprazole (ACIPHEX) 20 MG tablet Take 1 tablet (20 mg total) by mouth 2 (two) times a week.    . rosuvastatin (CRESTOR) 5 MG tablet Take 5 mg by mouth daily. (Patient not taking: Reported on 05/11/2020)    . sucralfate (CARAFATE) 1 g tablet Take 1 tablet (1 g total) by mouth 4 (four) times daily -  with meals and at bedtime. Crush and dissolve in 10 mL's of warm water prior to swallowing 120 tablet 1  . sucralfate (CARAFATE) 1 GM/10ML suspension Take 10 mLs (1 g total) by mouth 4 (four) times daily -  with meals and at bedtime. 420 mL 1  . SYNTHROID 112 MCG tablet Take 112 mcg by mouth daily before breakfast.     . vitamin B-12 (CYANOCOBALAMIN) 1000 MCG tablet Take 1,000 mcg by mouth daily.     No current facility-administered medications for this visit.    SURGICAL HISTORY:  Past Surgical History:  Procedure Laterality Date  . APPENDECTOMY    . BRONCHIAL BIOPSY  10/20/2019   Procedure: BRONCHIAL BIOPSIES;  Surgeon: Collene Gobble, MD;  Location[facility] ENDOSCOPY;  Service: Pulmonary;;  . BRONCHIAL BRUSHINGS  10/20/2019   Procedure: BRONCHIAL BRUSHINGS;  Surgeon: Collene Gobble, MD;  Location: [facility] ENDOSCOPY;  Service: Pulmonary;;  . BRONCHIAL NEEDLE ASPIRATION BIOPSY  10/20/2019   Procedure: BRONCHIAL NEEDLE ASPIRATION BIOPSIES;  Surgeon: Collene Gobble, MD;  Location: [place];  Service: Pulmonary;;  . BRONCHIAL WASHINGS  10/20/2019   Procedure: BRONCHIAL WASHINGS;  Surgeon: Collene Gobble, MD;  Location: [facility] ENDOSCOPY;  Service: Pulmonary;;  . CARPAL TUNNEL RELEASE Right 2011   Dr. Amedeo Plenty   . COLPORRHAPHY     posterior  . HAND SURGERY Right 2016   Nerve surgery, Dr. Amedeo Plenty  . OVARIAN CYST REMOVAL    . RECTOCELE REPAIR  2011   w/TVH and sling  . TONSILLECTOMY    . TONSILLECTOMY    . TOTAL KNEE ARTHROPLASTY Left 09/09/2018   Procedure: TOTAL KNEE ARTHROPLASTY, CORTISONE INJECTION RIGHT KNEE;  Surgeon: Paralee Cancel, MD;  Location: [facility] OR[facility];  Service: Orthopedics;  Laterality: Left;  70 mins  . TOTAL VAGINAL HYSTERECTOMY  10/18/2009   rectocele repair, sling  . TUBAL LIGATION Bilateral   . VARICOSE VEIN SURGERY    . VIDEO BRONCHOSCOPY WITH ENDOBRONCHIAL NAVIGATION N/A 10/20/2019   Procedure: VIDEO BRONCHOSCOPY WITH ENDOBRONCHIAL NAVIGATION;  Surgeon: Collene Gobble, MD;  Location: [place] ENDOSCOPY;  Service: Pulmonary;  Laterality: N/A;  REVIEW OF SYSTEMS:  A comprehensive review of systems was negative except for: Respiratory: positive for cough and wheezing Gastrointestinal: positive for diarrhea Musculoskeletal: positive for back pain   PHYSICAL EXAMINATION: General appearance: alert, cooperative, fatigued and no distress Head: Normocephalic, without obvious abnormality, atraumatic Neck: no adenopathy, no JVD, supple, symmetrical, trachea midline and thyroid not enlarged, symmetric, no tenderness/mass/nodules Lymph nodes: Cervical, supraclavicular, and axillary nodes normal. Resp: clear to auscultation bilaterally Back: symmetric, no curvature. ROM normal. No CVA tenderness. Cardio: regular rate and rhythm, S1, S2 normal, no murmur, click, rub or gallop GI: soft, non-tender; bowel sounds normal; no masses,  no organomegaly Extremities: extremities normal, atraumatic, no cyanosis or edema  ECOG PERFORMANCE STATUS: 1 - Symptomatic but completely ambulatory  Blood pressure 127/65, pulse 76, temperature (!) 97.5 F (36.4 C), temperature source Tympanic, resp. rate 17, height $RemoveBe'5\' 5"'ElMIXetMx$  (1.651 m), weight 161 lb 3.2 oz (73.1 kg), last menstrual period 08/21/2002, SpO2 98  %.  LABORATORY DATA: Lab Results  Component Value Date   WBC 4.2 06/08/2020   HGB 11.5 (L) 06/08/2020   HCT 35.3 (L) 06/08/2020   MCV 92.7 06/08/2020   PLT 105 (L) 06/08/2020      Chemistry      Component Value Date/Time   NA 141 06/08/2020 1508   K 4.5 06/08/2020 1508   CL 106 06/08/2020 1508   CO2 27 06/08/2020 1508   BUN 11 06/08/2020 1508   CREATININE 0.98 06/08/2020 1508      Component Value Date/Time   CALCIUM 9.4 06/08/2020 1508   ALKPHOS 65 06/08/2020 1508   AST 15 06/08/2020 1508   ALT 11 06/08/2020 1508   BILITOT 0.3 06/08/2020 1508       RADIOGRAPHIC STUDIES: No results found.  ASSESSMENT AND PLAN: This is a very pleasant 70 years old white female recently diagnosed with a stage IIIc/IV (T3, N3, M0/M1a) non-small cell lung cancer, adenocarcinoma presented with large left upper lobe lung mass in addition to left infrahilar, right hilar and subcarinal lymphadenopathy and small left pleural effusion diagnosed in August 2021 with positive EGFR mutation in exon 21 (L858R). The patient completed a course of concurrent chemoradiation with weekly carboplatin and paclitaxel status post 7 cycles.  She tolerated her treatment well except for fatigue as well as a skin burn and cough. Her scan showed mild improvement of the left upper lobe lung mass as well as the mediastinal lymphadenopathy.  The patient continues to have small left pleural effusion that is suspicious given her presentation. She is currently on treatment with Tagrisso 80 mg p.o. daily started on January 31, 2020.  Status post 4 months of treatment. The patient has been tolerating this treatment well with no concerning complaints except for occasional diarrhea. I recommended for her to continue her current treatment with Tagrisso with the same dose. I will see her back for follow-up visit in 1 months for evaluation and repeat blood work. For the cough and wheezing especially at nighttime, I gave her  prescription for albuterol to be used on as-needed basis. The patient was advised to call immediately if she has any concerning symptoms in the interval. The patient voices understanding of current disease status and treatment options and is in agreement with the current care plan.  All questions were answered. The patient knows to call the clinic with any problems, questions or concerns. We can certainly see the patient much sooner if necessary.  Disclaimer: This note was dictated with voice recognition software. Similar sounding  words can inadvertently be transcribed and may not be corrected upon review.

## 2020-06-09 ENCOUNTER — Encounter: Payer: Self-pay | Admitting: Internal Medicine

## 2020-06-14 ENCOUNTER — Telehealth: Payer: Self-pay

## 2020-06-14 ENCOUNTER — Other Ambulatory Visit: Payer: Self-pay | Admitting: *Deleted

## 2020-06-14 ENCOUNTER — Telehealth: Payer: Self-pay | Admitting: Internal Medicine

## 2020-06-14 DIAGNOSIS — R5381 Other malaise: Secondary | ICD-10-CM

## 2020-06-14 NOTE — Telephone Encounter (Signed)
Spoke with pt regarding her request for MD Mohamed's office to order a routine mammogram. Informed pt to call PCP for routine mammogram. Pt verbalizes understanding and agrees with plan of care.

## 2020-06-14 NOTE — Telephone Encounter (Signed)
Scheduled appt per 4/26 sch msg. Pt aware.

## 2020-06-20 ENCOUNTER — Other Ambulatory Visit (HOSPITAL_COMMUNITY): Payer: Self-pay

## 2020-06-20 ENCOUNTER — Other Ambulatory Visit: Payer: Self-pay | Admitting: Family Medicine

## 2020-06-20 DIAGNOSIS — N644 Mastodynia: Secondary | ICD-10-CM

## 2020-06-21 ENCOUNTER — Telehealth: Payer: Self-pay | Admitting: Medical Oncology

## 2020-06-21 NOTE — Telephone Encounter (Signed)
Per Dr Julien Nordmann I told pt he is not going to prescribe medrol pack because it can suppress her immune system and may effect Covid booster.

## 2020-06-21 NOTE — Telephone Encounter (Signed)
Worsening cough . Chest congestion.  2 nd Covid booster last Friday  Home Covid test negative today. No fever.  She took advil and feels better this afternoon and cough is a little better.  I told her to monitor symptoms for now and call if they worsen.  Asking for  Medrol dose pak.

## 2020-06-27 ENCOUNTER — Other Ambulatory Visit (HOSPITAL_COMMUNITY): Payer: Self-pay

## 2020-06-27 MED FILL — Osimertinib Mesylate Tab 80 MG (Base Equivalent): ORAL | 30 days supply | Qty: 30 | Fill #1 | Status: AC

## 2020-07-01 ENCOUNTER — Encounter (HOSPITAL_BASED_OUTPATIENT_CLINIC_OR_DEPARTMENT_OTHER): Payer: Self-pay | Admitting: Obstetrics & Gynecology

## 2020-07-01 ENCOUNTER — Ambulatory Visit (INDEPENDENT_AMBULATORY_CARE_PROVIDER_SITE_OTHER): Payer: Medicare Other | Admitting: Obstetrics & Gynecology

## 2020-07-01 ENCOUNTER — Other Ambulatory Visit: Payer: Self-pay

## 2020-07-01 VITALS — BP 132/59 | HR 81 | Ht 65.0 in | Wt 157.0 lb

## 2020-07-01 DIAGNOSIS — Z90710 Acquired absence of both cervix and uterus: Secondary | ICD-10-CM

## 2020-07-01 DIAGNOSIS — Z9189 Other specified personal risk factors, not elsewhere classified: Secondary | ICD-10-CM

## 2020-07-01 DIAGNOSIS — N3281 Overactive bladder: Secondary | ICD-10-CM | POA: Diagnosis not present

## 2020-07-01 DIAGNOSIS — Z780 Asymptomatic menopausal state: Secondary | ICD-10-CM

## 2020-07-01 DIAGNOSIS — Z01419 Encounter for gynecological examination (general) (routine) without abnormal findings: Secondary | ICD-10-CM

## 2020-07-01 DIAGNOSIS — C3492 Malignant neoplasm of unspecified part of left bronchus or lung: Secondary | ICD-10-CM

## 2020-07-01 NOTE — Progress Notes (Signed)
70 y.o. Linda Woods Married White or Caucasian female here for breast and pelvic exam.  H/o lung cancer diagnosed last year, stage IIIc.  Underwent chemotherapy and currently on Tagrisso 80mg  daily.    Denies vaginal bleeding.  Had some issues with what felt like recurrent UTI.  Has cultures, cystoscopy with urology.  Did have one culture showed e coli.  Treated for this but symptoms started back.  Now on myrbetriq and symptoms have fully resolved.    Patient's last menstrual period was 08/21/2002.          Sexually active: No.  H/O STD:  no  Health Maintenance: PCP:  Dr. Karie Kirks.  Last wellness appt was earlier this year.  Did blood work at that appt:  Yes.  Has glucose, TSH and Lipid Vaccines are up to date:  Pt is sure she is up to date with pneumonia vaccinations but tdap is likely due Colonoscopy:  2021 with Dr. Earlean Shawl MMG:  Scheduled 07/21/2020, last one was 10/2018 BMD:  Plan next year.  Was normal in 10/2017 Last pap smear:  2018.   H/o abnormal pap smear:  no   reports that she has never smoked. She has never used smokeless tobacco. She reports current alcohol use of about 2.0 - 3.0 standard drinks of alcohol per week. She reports that she does not use drugs.  Past Medical History:  Diagnosis Date  . Anemia    as a child  . Anxiety   . Bursitis   . Cancer ([place])   . Chronic reflux esophagitis   . Complication of anesthesia   . Diarrhea, functional   . Diverticulosis   . GERD (gastroesophageal reflux disease)   . History of kidney stones   . Hypertension   . Knee pain    right knee-seeing ortho  . Osteoarthritis   . Plantar fasciitis   . PONV (postoperative nausea and vomiting)    has used the patch before and that helps  . Pure hypercholesterolemia   . RLS (restless legs syndrome)   . Superficial vein thrombosis   . Thyroid disease    hypothyroid    Past Surgical History:  Procedure Laterality Date  . APPENDECTOMY    . BRONCHIAL BIOPSY  10/20/2019   Procedure:  BRONCHIAL BIOPSIES;  Surgeon: Collene Gobble, MD;  Location: [facility] ENDOSCOPY;  Service: Pulmonary;;  . BRONCHIAL BRUSHINGS  10/20/2019   Procedure: BRONCHIAL BRUSHINGS;  Surgeon: Collene Gobble, MD;  Location: [facility] ENDOSCOPY;  Service: Pulmonary;;  . BRONCHIAL NEEDLE ASPIRATION BIOPSY  10/20/2019   Procedure: BRONCHIAL NEEDLE ASPIRATION BIOPSIES;  Surgeon: Collene Gobble, MD;  Location: [place];  Service: Pulmonary;;  . BRONCHIAL WASHINGS  10/20/2019   Procedure: BRONCHIAL WASHINGS;  Surgeon: Collene Gobble, MD;  Location: [facility] ENDOSCOPY;  Service: Pulmonary;;  . CARPAL TUNNEL RELEASE Right 2011   Dr. Amedeo Plenty  . COLPORRHAPHY     posterior  . HAND SURGERY Right 2016   Nerve surgery, Dr. Amedeo Plenty  . OVARIAN CYST REMOVAL    . RECTOCELE REPAIR  2011   w/TVH and sling  . TONSILLECTOMY    . TONSILLECTOMY    . TOTAL KNEE ARTHROPLASTY Left 09/09/2018   Procedure: TOTAL KNEE ARTHROPLASTY, CORTISONE INJECTION RIGHT KNEE;  Surgeon: Paralee Cancel, MD;  Location: [facility] ORS;  Service: Orthopedics;  Laterality: Left;  70 mins  . TOTAL VAGINAL HYSTERECTOMY  10/18/2009   rectocele repair, sling  . TUBAL LIGATION Bilateral   . VARICOSE VEIN SURGERY    .  VIDEO BRONCHOSCOPY WITH ENDOBRONCHIAL NAVIGATION N/A 10/20/2019   Procedure: VIDEO BRONCHOSCOPY WITH ENDOBRONCHIAL NAVIGATION;  Surgeon: Collene Gobble, MD;  Location: [facility] ENDOSCOPY;  Service: Pulmonary;  Laterality: N/A;    Current Outpatient Medications  Medication Sig Dispense Refill  . albuterol (VENTOLIN HFA) 108 (90 Base) MCG/ACT inhaler Inhale 2 puffs into the lungs every 6 (six) hours as needed for wheezing or shortness of breath. 8 g 0  . cholecalciferol (VITAMIN D3) 25 MCG (1000 UNIT) tablet Take 1,000 Units by mouth daily. Pt states she take 2000 units/day    . diphenoxylate-atropine (LOMOTIL) 2.5-0.025 MG tablet Take 1 tablet by mouth daily as needed for diarrhea or loose stools.     . famotidine (PEPCID) 40 MG tablet Take 40 mg by mouth See admin  instructions. Five times a week    . ferrous sulfate 325 (65 FE) MG tablet Take 325 mg by mouth daily with breakfast.    . fexofenadine (ALLEGRA) 180 MG tablet Take 180 mg by mouth daily.    . fluticasone (FLONASE) 50 MCG/ACT nasal spray Place 2 sprays into both nostrils daily.    Marland Kitchen HYDROcodone-acetaminophen (NORCO/VICODIN) 5-325 MG tablet Take 0.5 tablets by mouth daily as needed (Knee pain).     Marland Kitchen HYDROcodone-homatropine (HYCODAN) 5-1.5 MG/5ML syrup Take 5 mLs by mouth daily as needed for cough.    Marland Kitchen ibuprofen (ADVIL) 200 MG tablet Take 400 mg by mouth every 6 (six) hours as needed (Knee pain).    Marland Kitchen lidocaine (LIDODERM) 5 % 1 patch daily.    Marland Kitchen LORazepam (ATIVAN) 0.5 MG tablet Take 0.5 mg by mouth at bedtime.     . magic mouthwash SOLN 5 to 10 ml swish and spit or swallow QID prn mouth and esophageal pain 240 mL 2  . melatonin 5 MG TABS Take 5 mg by mouth at bedtime.    Marland Kitchen MYRBETRIQ 25 MG TB24 tablet Take 25 mg by mouth daily.    Marland Kitchen osimertinib mesylate (TAGRISSO) 80 MG tablet TAKE 1 TABLET BY MOUTH DAILY. 30 tablet 2  . oxyCODONE-acetaminophen (PERCOCET/ROXICET) 5-325 MG tablet Take 1 tablet by mouth every 4 (four) hours as needed for severe pain. 45 tablet 0  . oxymetazoline (AFRIN) 0.05 % nasal spray Place 1 spray into both nostrils daily as needed for congestion.    . phenazopyridine (PYRIDIUM) 200 MG tablet Take 200 mg by mouth daily as needed.    Marland Kitchen PREMARIN vaginal cream Place vaginally.    . prochlorperazine (COMPAZINE) 10 MG tablet Take 1 tablet (10 mg total) by mouth every 6 (six) hours as needed for nausea or vomiting. 30 tablet 0  . RABEprazole (ACIPHEX) 20 MG tablet Take 1 tablet (20 mg total) by mouth 2 (two) times a week.    . rosuvastatin (CRESTOR) 5 MG tablet Take 5 mg by mouth daily.    Marland Kitchen SYNTHROID 112 MCG tablet Take 112 mcg by mouth daily before breakfast.     . vitamin B-12 (CYANOCOBALAMIN) 1000 MCG tablet Take 1,000 mcg by mouth daily.    . metoprolol succinate (TOPROL XL)  50 MG 24 hr tablet Take 50 mg by mouth daily.      No current facility-administered medications for this visit.    Family History  Problem Relation Age of Onset  . Cervical cancer Mother   . Heart failure Mother   . Heart failure Father   . Diabetes Father   . Cancer Brother   . Breast cancer Maternal Aunt   . Breast  cancer Paternal Aunt     Review of Systems  Constitutional: Negative.   Gastrointestinal: Negative.   Genitourinary: Negative.     Exam:   BP (!) 132/59   Pulse 81   Ht 5\' 5"  (1.651 m)   Wt 157 lb (71.2 kg)   LMP 08/21/2002   BMI 26.13 kg/m   Height: 5\' 5"  (165.1 cm)  General appearance: alert, cooperative and appears stated age Breasts: normal appearance, no masses or tenderness Abdomen: soft, non-tender; bowel sounds normal; no masses,  no organomegaly Lymph nodes: Cervical, supraclavicular, and axillary nodes normal.  No abnormal inguinal nodes palpated Neurologic: Grossly normal  Pelvic: External genitalia:  no lesions              Urethra:  normal appearing urethra with no masses, tenderness or lesions              Bartholins and Skenes: normal                 Vagina: normal appearing vagina with atrophic changes and no discharge, no lesions              Cervix: absent              Pap taken: No. Bimanual Exam:  Uterus:  uterus absent              Adnexa: normal adnexa and no mass, fullness, tenderness               Rectovaginal: Confirms               Anus:  normal sphincter tone, no lesions  Chaperone, Octaviano Batty, CMA, was present for exam.  Assessment/Plan: 1. GYN exam for high-risk Medicare patient - pap smear not indicated - MMG scheduled 07/21/20 - colonoscopy with Dr. Earlean Shawl 2021 - plan BMD next year - lab owrk with Dr. Karie Kirks - vaccines updated.  Tdap due. - discussed with pt decreasing frequency of visits.  States she still wants to be seen yearly  2. Adenocarcinoma of left lung, stage 3 ([place]) - followed by Dr. Curt Bears  3. OAB (overactive bladder) - on Myrbetriq  4. Postmenopausal - no HRT  5. History of total vaginal hysterectomy, mid urethral sling 8/11

## 2020-07-06 ENCOUNTER — Inpatient Hospital Stay: Payer: Medicare Other

## 2020-07-06 ENCOUNTER — Other Ambulatory Visit: Payer: Self-pay

## 2020-07-06 ENCOUNTER — Inpatient Hospital Stay: Payer: Medicare Other | Attending: Internal Medicine | Admitting: Internal Medicine

## 2020-07-06 ENCOUNTER — Encounter: Payer: Self-pay | Admitting: Internal Medicine

## 2020-07-06 VITALS — BP 115/83 | HR 82 | Temp 97.3°F | Resp 18 | Ht 65.0 in | Wt 158.6 lb

## 2020-07-06 DIAGNOSIS — C3412 Malignant neoplasm of upper lobe, left bronchus or lung: Secondary | ICD-10-CM | POA: Diagnosis present

## 2020-07-06 DIAGNOSIS — R0602 Shortness of breath: Secondary | ICD-10-CM | POA: Insufficient documentation

## 2020-07-06 DIAGNOSIS — R059 Cough, unspecified: Secondary | ICD-10-CM | POA: Diagnosis not present

## 2020-07-06 DIAGNOSIS — R197 Diarrhea, unspecified: Secondary | ICD-10-CM | POA: Insufficient documentation

## 2020-07-06 DIAGNOSIS — J90 Pleural effusion, not elsewhere classified: Secondary | ICD-10-CM | POA: Diagnosis not present

## 2020-07-06 DIAGNOSIS — C3492 Malignant neoplasm of unspecified part of left bronchus or lung: Secondary | ICD-10-CM

## 2020-07-06 DIAGNOSIS — C3490 Malignant neoplasm of unspecified part of unspecified bronchus or lung: Secondary | ICD-10-CM | POA: Diagnosis not present

## 2020-07-06 DIAGNOSIS — Z5111 Encounter for antineoplastic chemotherapy: Secondary | ICD-10-CM

## 2020-07-06 DIAGNOSIS — I10 Essential (primary) hypertension: Secondary | ICD-10-CM | POA: Diagnosis not present

## 2020-07-06 LAB — CMP (CANCER CENTER ONLY)
ALT: 13 U/L (ref 0–44)
AST: 20 U/L (ref 15–41)
Albumin: 3.9 g/dL (ref 3.5–5.0)
Alkaline Phosphatase: 73 U/L (ref 38–126)
Anion gap: 9 (ref 5–15)
BUN: 13 mg/dL (ref 8–23)
CO2: 26 mmol/L (ref 22–32)
Calcium: 9.4 mg/dL (ref 8.9–10.3)
Chloride: 105 mmol/L (ref 98–111)
Creatinine: 0.79 mg/dL (ref 0.44–1.00)
GFR, Estimated: >60 mL/min (ref >60)
Glucose, Bld: 92 mg/dL (ref 70–99)
Potassium: 4.1 mmol/L (ref 3.5–5.1)
Sodium: 140 mmol/L (ref 135–145)
Total Bilirubin: 0.3 mg/dL (ref 0.3–1.2)
Total Protein: 6.8 g/dL (ref 6.5–8.1)

## 2020-07-06 LAB — CBC WITH DIFFERENTIAL (CANCER CENTER ONLY)
Abs Immature Granulocytes: 0.02 10*3/uL (ref 0.00–0.07)
Basophils Absolute: 0 10*3/uL (ref 0.0–0.1)
Basophils Relative: 0 %
Eosinophils Absolute: 0.1 10*3/uL (ref 0.0–0.5)
Eosinophils Relative: 3 %
HCT: 34.6 % — ABNORMAL LOW (ref 36.0–46.0)
Hemoglobin: 12 g/dL (ref 12.0–15.0)
Immature Granulocytes: 0 %
Lymphocytes Relative: 20 %
Lymphs Abs: 0.9 10*3/uL (ref 0.7–4.0)
MCH: 31.3 pg (ref 26.0–34.0)
MCHC: 34.7 g/dL (ref 30.0–36.0)
MCV: 90.1 fL (ref 80.0–100.0)
Monocytes Absolute: 0.4 10*3/uL (ref 0.1–1.0)
Monocytes Relative: 10 %
Neutro Abs: 3 10*3/uL (ref 1.7–7.7)
Neutrophils Relative %: 67 %
Platelet Count: 105 10*3/uL — ABNORMAL LOW (ref 150–400)
RBC: 3.84 MIL/uL — ABNORMAL LOW (ref 3.87–5.11)
RDW: 13.3 % (ref 11.5–15.5)
WBC Count: 4.5 10*3/uL (ref 4.0–10.5)
nRBC: 0 % (ref 0.0–0.2)

## 2020-07-06 MED ORDER — METHYLPREDNISOLONE 4 MG PO TBPK: ORAL_TABLET | ORAL | 0 refills | Status: DC

## 2020-07-06 NOTE — Progress Notes (Signed)
[place] [phone]   [phone]  OFFICE PROGRESS NOTE  [name], MD [place] [address]  DIAGNOSIS: Stage IIIc (T3, N3, M0)non-small cell lung cancer, adenocarcinoma presented with large left upper lobe lung mass in addition to left infrahilar and right hilar and subcarinal lymphadenopathy diagnosed in August 2021.  Molecular Biomarkers: Insufficient tissue for foundation 1. Guardant 360 results.  DETECTED ALTERATION(S) / BIOMARKER(S) % CFDNA OR AMPLIFICATION ASSOCIATED FDA-APPROVED THERAPIES CLINICAL TRIAL AVAILABILITY  EGFR L858R, 1.2%, Afatinib, Dacomitinib, Erlotinib, Gefitinib, Osimertinib, Ramucirumab Yes [id number], 1.0%, Afatinib, Dacomitinib, Erlotinib, Gefitinib, Osimertinib, Ramucirumab Yes [id number] 1.0% None  No  PRIOR THERAPY: Weekly concurrent chemoradiation with carboplatin for an AUC of 2 and paclitaxel 45 mg/m2.First dose expected on 11/16/2019. Status post 7 cycles.  CURRENT THERAPY: Tagrisso 80 mg p.o. daily. First dose started 01/31/2020.  Status post 5 months of treatment.  INTERVAL HISTORY: Linda Woods 70 y.o. female returns to the clinic today for 1 month follow-up visit.  The patient is feeling fine today with no concerning complaints except for the persistent cough productive of whitish sputum.  She denied having any current chest pain but has shortness of breath with exertion with no hemoptysis.  She denied having any recent weight loss or night sweats.  She has no nausea, vomiting, diarrhea or constipation.  She has no rash or itching.  She continues to tolerate her treatment with Tagrisso fairly well.  The patient is here today for evaluation and repeat blood work.    MEDICAL HISTORY: Past Medical History:  Diagnosis Date  . Anemia    as a child  . Anxiety   . Bursitis   . Cancer ([place])   . Chronic reflux esophagitis   . Complication of anesthesia   . Diarrhea,  functional   . Diverticulosis   . GERD (gastroesophageal reflux disease)   . History of kidney stones   . Hypertension   . Knee pain    right knee-seeing ortho  . Osteoarthritis   . Plantar fasciitis   . PONV (postoperative nausea and vomiting)    has used the patch before and that helps  . Pure hypercholesterolemia   . RLS (restless legs syndrome)   . Superficial vein thrombosis   . Thyroid disease    hypothyroid    ALLERGIES:  is allergic to crestor [rosuvastatin calcium].  MEDICATIONS:  Current Outpatient Medications  Medication Sig Dispense Refill  . albuterol (VENTOLIN HFA) 108 (90 Base) MCG/ACT inhaler Inhale 2 puffs into the lungs every 6 (six) hours as needed for wheezing or shortness of breath. 8 g 0  . cholecalciferol (VITAMIN D3) 25 MCG (1000 UNIT) tablet Take 1,000 Units by mouth daily. Pt states she take 2000 units/day    . diphenoxylate-atropine (LOMOTIL) 2.5-0.025 MG tablet Take 1 tablet by mouth daily as needed for diarrhea or loose stools.     . famotidine (PEPCID) 40 MG tablet Take 40 mg by mouth See admin instructions. Five times a week    . ferrous sulfate 325 (65 FE) MG tablet Take 325 mg by mouth daily with breakfast.    . fexofenadine (ALLEGRA) 180 MG tablet Take 180 mg by mouth daily.    . fluticasone (FLONASE) 50 MCG/ACT nasal spray Place 2 sprays into both nostrils daily.    Marland Kitchen HYDROcodone-acetaminophen (NORCO/VICODIN) 5-325 MG tablet Take 0.5 tablets by mouth daily as needed (Knee pain).     Marland Kitchen HYDROcodone-homatropine (HYCODAN) 5-1.5 MG/5ML syrup  Take 5 mLs by mouth daily as needed for cough.    Marland Kitchen ibuprofen (ADVIL) 200 MG tablet Take 400 mg by mouth every 6 (six) hours as needed (Knee pain).    Marland Kitchen lidocaine (LIDODERM) 5 % 1 patch daily.    Marland Kitchen LORazepam (ATIVAN) 0.5 MG tablet Take 0.5 mg by mouth at bedtime.     . magic mouthwash SOLN 5 to 10 ml swish and spit or swallow QID prn mouth and esophageal pain 240 mL 2  . melatonin 5 MG TABS Take 5 mg by mouth at  bedtime.    . metoprolol succinate (TOPROL XL) 50 MG 24 hr tablet Take 50 mg by mouth daily.     Marland Kitchen MYRBETRIQ 25 MG TB24 tablet Take 25 mg by mouth daily.    Marland Kitchen osimertinib mesylate (TAGRISSO) 80 MG tablet TAKE 1 TABLET BY MOUTH DAILY. 30 tablet 2  . oxyCODONE-acetaminophen (PERCOCET/ROXICET) 5-325 MG tablet Take 1 tablet by mouth every 4 (four) hours as needed for severe pain. 45 tablet 0  . oxymetazoline (AFRIN) 0.05 % nasal spray Place 1 spray into both nostrils daily as needed for congestion.    . phenazopyridine (PYRIDIUM) 200 MG tablet Take 200 mg by mouth daily as needed.    . prochlorperazine (COMPAZINE) 10 MG tablet Take 1 tablet (10 mg total) by mouth every 6 (six) hours as needed for nausea or vomiting. 30 tablet 0  . RABEprazole (ACIPHEX) 20 MG tablet Take 1 tablet (20 mg total) by mouth 2 (two) times a week.    . rosuvastatin (CRESTOR) 5 MG tablet Take 5 mg by mouth daily.    Marland Kitchen SYNTHROID 112 MCG tablet Take 112 mcg by mouth daily before breakfast.     . vitamin B-12 (CYANOCOBALAMIN) 1000 MCG tablet Take 1,000 mcg by mouth daily.     No current facility-administered medications for this visit.    SURGICAL HISTORY:  Past Surgical History:  Procedure Laterality Date  . APPENDECTOMY    . BRONCHIAL BIOPSY  10/20/2019   Procedure: BRONCHIAL BIOPSIES;  Surgeon: Collene Gobble, MD;  Location: [facility] ENDOSCOPY;  Service: Pulmonary;;  . BRONCHIAL BRUSHINGS  10/20/2019   Procedure: BRONCHIAL BRUSHINGS;  Surgeon: Collene Gobble, MD;  Location: [facility] ENDOSCOPY;  Service: Pulmonary;;  . BRONCHIAL NEEDLE ASPIRATION BIOPSY  10/20/2019   Procedure: BRONCHIAL NEEDLE ASPIRATION BIOPSIES;  Surgeon: Collene Gobble, MD;  Location: [place];  Service: Pulmonary;;  . BRONCHIAL WASHINGS  10/20/2019   Procedure: BRONCHIAL WASHINGS;  Surgeon: Collene Gobble, MD;  Location[facility] ENDOSCOPY;  Service: Pulmonary;;  . CARPAL TUNNEL RELEASE Right 2011   Dr. Amedeo Plenty  . COLPORRHAPHY     posterior  . HAND SURGERY  Right 2016   Nerve surgery, Dr. Amedeo Plenty  . OVARIAN CYST REMOVAL    . RECTOCELE REPAIR  2011   w/TVH and sling  . TONSILLECTOMY    . TONSILLECTOMY    . TOTAL KNEE ARTHROPLASTY Left 09/09/2018   Procedure: TOTAL KNEE ARTHROPLASTY, CORTISONE INJECTION RIGHT KNEE;  Surgeon: Paralee Cancel, MD;  Location: [facility] ORS;  Service: Orthopedics;  Laterality: Left;  70 mins  . TOTAL VAGINAL HYSTERECTOMY  10/18/2009   rectocele repair, sling  . TUBAL LIGATION Bilateral   . VARICOSE VEIN SURGERY    . VIDEO BRONCHOSCOPY WITH ENDOBRONCHIAL NAVIGATION N/A 10/20/2019   Procedure: VIDEO BRONCHOSCOPY WITH ENDOBRONCHIAL NAVIGATION;  Surgeon: Collene Gobble, MD;  Location: [place] ENDOSCOPY;  Service: Pulmonary;  Laterality: N/A;    REVIEW OF SYSTEMS:  A comprehensive review of systems was negative except for: Respiratory: positive for cough and sputum Musculoskeletal: positive for back pain   PHYSICAL EXAMINATION: General appearance: alert, cooperative, fatigued and no distress Head: Normocephalic, without obvious abnormality, atraumatic Neck: no adenopathy, no JVD, supple, symmetrical, trachea midline and thyroid not enlarged, symmetric, no tenderness/mass/nodules Lymph nodes: Cervical, supraclavicular, and axillary nodes normal. Resp: clear to auscultation bilaterally Back: symmetric, no curvature. ROM normal. No CVA tenderness. Cardio: regular rate and rhythm, S1, S2 normal, no murmur, click, rub or gallop GI: soft, non-tender; bowel sounds normal; no masses,  no organomegaly Extremities: extremities normal, atraumatic, no cyanosis or edema  ECOG PERFORMANCE STATUS: 1 - Symptomatic but completely ambulatory  Blood pressure 115/83, pulse 82, temperature (!) 97.3 F (36.3 C), temperature source Tympanic, resp. rate 18, height _0  (1.651 m), weight 158 lb 9.6 oz (71.9 kg), last menstrual period 08/21/2002, SpO2 99 %.  LABORATORY DATA: Lab Results  Component Value Date   WBC 4.2 06/08/2020   HGB 11.5 (L)  06/08/2020   HCT 35.3 (L) 06/08/2020   MCV 92.7 06/08/2020   PLT 105 (L) 06/08/2020      Chemistry      Component Value Date/Time   NA 141 06/08/2020 1508   K 4.5 06/08/2020 1508   CL 106 06/08/2020 1508   CO2 27 06/08/2020 1508   BUN 11 06/08/2020 1508   CREATININE 0.98 06/08/2020 1508      Component Value Date/Time   CALCIUM 9.4 06/08/2020 1508   ALKPHOS 65 06/08/2020 1508   AST 15 06/08/2020 1508   ALT 11 06/08/2020 1508   BILITOT 0.3 06/08/2020 1508       RADIOGRAPHIC STUDIES: No results found.  ASSESSMENT AND PLAN: This is a very pleasant 70 years old white female recently diagnosed with a stage IIIc/IV (T3, N3, M0/M1a) non-small cell lung cancer, adenocarcinoma presented with large left upper lobe lung mass in addition to left infrahilar, right hilar and subcarinal lymphadenopathy and small left pleural effusion diagnosed in August 2021 with positive EGFR mutation in exon 21 (L858R). The patient completed a course of concurrent chemoradiation with weekly carboplatin and paclitaxel status post 7 cycles.  She tolerated her treatment well except for fatigue as well as a skin burn and cough. Her scan showed mild improvement of the left upper lobe lung mass as well as the mediastinal lymphadenopathy.  The patient continues to have small left pleural effusion that is suspicious given her presentation. She is currently on treatment with Tagrisso 80 mg p.o. daily started on January 31, 2020.  Status post 5 months of treatment. She has been tolerating this treatment fairly well with no significant complaints except for occasional diarrhea.  The patient continues to have cough productive of whitish sputum as well as shortness of breath with exertion. I recommended for her to continue her current treatment with Tagrisso with the same dose. For the cough and shortness of breath, I will give the patient prescription for Medrol Dosepak. For the diarrhea she will continue on Imodium on  as-needed basis. I will see her back for follow-up visit in 1 months for evaluation and repeat CT scan of the chest for restaging of her disease. The patient was advised to call immediately if she has any other concerning symptoms in the interval. The patient voices understanding of current disease status and treatment options and is in agreement with the current care plan.  All questions were answered. The patient knows to call the clinic with  any problems, questions or concerns. We can certainly see the patient much sooner if necessary.  Disclaimer: This note was dictated with voice recognition software. Similar sounding words can inadvertently be transcribed and may not be corrected upon review.

## 2020-07-07 ENCOUNTER — Telehealth: Payer: Self-pay | Admitting: Internal Medicine

## 2020-07-07 NOTE — Telephone Encounter (Signed)
Scheduled per los. Called and left msg. Mailed printout  °

## 2020-07-09 ENCOUNTER — Ambulatory Visit
Admission: RE | Admit: 2020-07-09 | Discharge: 2020-07-09 | Disposition: A | Discharge status: Home / Self Care | Payer: Medicare Other | Source: Ambulatory Visit | Attending: Family Medicine | Admitting: Family Medicine

## 2020-07-09 ENCOUNTER — Other Ambulatory Visit: Payer: Self-pay

## 2020-07-09 DIAGNOSIS — N644 Mastodynia: Secondary | ICD-10-CM

## 2020-07-20 ENCOUNTER — Other Ambulatory Visit: Payer: Self-pay | Admitting: Internal Medicine

## 2020-07-21 ENCOUNTER — Other Ambulatory Visit: Payer: Self-pay | Admitting: Internal Medicine

## 2020-07-21 ENCOUNTER — Other Ambulatory Visit (HOSPITAL_COMMUNITY): Payer: Self-pay

## 2020-07-21 ENCOUNTER — Other Ambulatory Visit: Payer: Medicare Other

## 2020-07-21 DIAGNOSIS — C3492 Malignant neoplasm of unspecified part of left bronchus or lung: Secondary | ICD-10-CM

## 2020-07-21 MED ORDER — OSIMERTINIB MESYLATE 80 MG PO TABS
ORAL_TABLET | Freq: Every day | ORAL | 2 refills | Status: DC
  Filled 2020-07-21: qty 30, 30d supply, fill #0
  Filled 2020-08-16: qty 30, 30d supply, fill #1
  Filled 2020-09-20: qty 30, 30d supply, fill #2

## 2020-07-25 ENCOUNTER — Other Ambulatory Visit (HOSPITAL_COMMUNITY): Payer: Self-pay

## 2020-07-26 ENCOUNTER — Telehealth: Payer: Self-pay | Admitting: Medical Oncology

## 2020-07-26 NOTE — Telephone Encounter (Signed)
Medrol has not helped   cough is somewhat better ,  but she is still wheezing.    She is going on vacation friday and is concerned because she is still wheezing and has a little bit of a cough esp when she talks .  Please advise.

## 2020-07-27 ENCOUNTER — Telehealth: Payer: Self-pay | Admitting: Internal Medicine

## 2020-07-27 ENCOUNTER — Telehealth: Payer: Self-pay | Admitting: Emergency Medicine

## 2020-07-27 MED ORDER — AZITHROMYCIN 250 MG PO TABS: ORAL_TABLET | ORAL | 0 refills | Status: DC

## 2020-07-27 NOTE — Telephone Encounter (Signed)
LMTCB for [employer] and spoke with the pt  She is c/o increased cough and wheezing x 3 wks  Her cough is occ prod with clear sputum  Cough is triggered by talking  Not waking her up  She has hycodan that helps some  She finished medrol per Dr Julien Nordmann about 10 days ago with not much relief  She has occ chest tightness  Not having increased SOB and sats are 96%ra  No f/c/s, aches  Fully vaxxed and has had booster  Please advise thanks!

## 2020-07-27 NOTE — Telephone Encounter (Signed)
For respiratory symptom , Dr Julien Nordmann said " We may have to refer her to pulmonary medicine. She can also stop her treatment with Tagrisso for 7-10 days to see if there is any improvement in her condition. Thank you".  She does not want to stop Tagrisso. Pulmonary -Dr Agustina Caroli office will call back.

## 2020-07-27 NOTE — Telephone Encounter (Signed)
Please have her set up an OV w me or APP  In meantime give azithro z -pack.  Thanks.

## 2020-07-27 NOTE — Telephone Encounter (Signed)
Called and spoke with pt letting her know that we sent Rx for zpak to pharmacy for and she verbalized understanding. Nothing further needed.

## 2020-07-27 NOTE — Telephone Encounter (Signed)
ATC patient.  LM to call back. Zpac pending until Patient calls and  pharmacy clarified.

## 2020-07-27 NOTE — Telephone Encounter (Signed)
R/s per 6/8 sch msg . Pt aware

## 2020-07-28 ENCOUNTER — Other Ambulatory Visit (HOSPITAL_COMMUNITY): Payer: Self-pay

## 2020-08-05 ENCOUNTER — Other Ambulatory Visit: Payer: Medicare Other

## 2020-08-08 ENCOUNTER — Other Ambulatory Visit: Payer: Self-pay

## 2020-08-08 ENCOUNTER — Encounter: Payer: Self-pay | Admitting: Internal Medicine

## 2020-08-08 ENCOUNTER — Inpatient Hospital Stay: Payer: Medicare Other

## 2020-08-08 ENCOUNTER — Inpatient Hospital Stay: Payer: Medicare Other | Attending: Internal Medicine | Admitting: Internal Medicine

## 2020-08-08 ENCOUNTER — Ambulatory Visit (HOSPITAL_COMMUNITY)
Admission: RE | Admit: 2020-08-08 | Discharge: 2020-08-08 | Disposition: A | Discharge status: Home / Self Care | Payer: Medicare Other | Source: Ambulatory Visit | Attending: Internal Medicine | Admitting: Internal Medicine

## 2020-08-08 ENCOUNTER — Encounter (HOSPITAL_COMMUNITY): Payer: Self-pay

## 2020-08-08 ENCOUNTER — Other Ambulatory Visit: Payer: Medicare Other

## 2020-08-08 VITALS — BP 120/88 | HR 94 | Temp 96.1°F | Resp 20 | Ht 65.0 in | Wt 163.0 lb

## 2020-08-08 DIAGNOSIS — J90 Pleural effusion, not elsewhere classified: Secondary | ICD-10-CM | POA: Diagnosis not present

## 2020-08-08 DIAGNOSIS — C3490 Malignant neoplasm of unspecified part of unspecified bronchus or lung: Secondary | ICD-10-CM | POA: Diagnosis present

## 2020-08-08 DIAGNOSIS — R062 Wheezing: Secondary | ICD-10-CM | POA: Diagnosis not present

## 2020-08-08 DIAGNOSIS — R197 Diarrhea, unspecified: Secondary | ICD-10-CM | POA: Insufficient documentation

## 2020-08-08 DIAGNOSIS — C3492 Malignant neoplasm of unspecified part of left bronchus or lung: Secondary | ICD-10-CM

## 2020-08-08 DIAGNOSIS — Z5111 Encounter for antineoplastic chemotherapy: Secondary | ICD-10-CM

## 2020-08-08 DIAGNOSIS — C3412 Malignant neoplasm of upper lobe, left bronchus or lung: Secondary | ICD-10-CM | POA: Insufficient documentation

## 2020-08-08 LAB — CBC WITH DIFFERENTIAL (CANCER CENTER ONLY)
Abs Immature Granulocytes: 0.03 10*3/uL (ref 0.00–0.07)
Basophils Absolute: 0 10*3/uL (ref 0.0–0.1)
Basophils Relative: 0 %
Eosinophils Absolute: 0.1 10*3/uL (ref 0.0–0.5)
Eosinophils Relative: 2 %
HCT: 36.9 % (ref 36.0–46.0)
Hemoglobin: 12.6 g/dL (ref 12.0–15.0)
Immature Granulocytes: 0 %
Lymphocytes Relative: 15 %
Lymphs Abs: 1.1 10*3/uL (ref 0.7–4.0)
MCH: 32 pg (ref 26.0–34.0)
MCHC: 34.1 g/dL (ref 30.0–36.0)
MCV: 93.7 fL (ref 80.0–100.0)
Monocytes Absolute: 0.6 10*3/uL (ref 0.1–1.0)
Monocytes Relative: 9 %
Neutro Abs: 5.2 10*3/uL (ref 1.7–7.7)
Neutrophils Relative %: 74 %
Platelet Count: 107 10*3/uL — ABNORMAL LOW (ref 150–400)
RBC: 3.94 MIL/uL (ref 3.87–5.11)
RDW: 14 % (ref 11.5–15.5)
WBC Count: 7.1 10*3/uL (ref 4.0–10.5)
nRBC: 0 % (ref 0.0–0.2)

## 2020-08-08 LAB — CMP (CANCER CENTER ONLY)
ALT: 24 U/L (ref 0–44)
AST: 18 U/L (ref 15–41)
Albumin: 3.9 g/dL (ref 3.5–5.0)
Alkaline Phosphatase: 57 U/L (ref 38–126)
Anion gap: 7 (ref 5–15)
BUN: 13 mg/dL (ref 8–23)
CO2: 29 mmol/L (ref 22–32)
Calcium: 9.4 mg/dL (ref 8.9–10.3)
Chloride: 103 mmol/L (ref 98–111)
Creatinine: 0.75 mg/dL (ref 0.44–1.00)
GFR, Estimated: >60 mL/min (ref >60)
Glucose, Bld: 108 mg/dL — ABNORMAL HIGH (ref 70–99)
Potassium: 4.3 mmol/L (ref 3.5–5.1)
Sodium: 139 mmol/L (ref 135–145)
Total Bilirubin: 0.8 mg/dL (ref 0.3–1.2)
Total Protein: 6.7 g/dL (ref 6.5–8.1)

## 2020-08-08 MED ORDER — IOHEXOL 300 MG/ML  SOLN
75.0000 mL | Freq: Once | INTRAMUSCULAR | Status: AC | PRN
  Administered 2020-08-08: 75 mL via INTRAVENOUS

## 2020-08-08 MED ORDER — SODIUM CHLORIDE (PF) 0.9 % IJ SOLN
INTRAMUSCULAR | Status: AC
  Filled 2020-08-08: qty 50

## 2020-08-08 NOTE — Progress Notes (Signed)
[name]   [phone]  OFFICE PROGRESS NOTE  [name], MD [place] [address]  DIAGNOSIS: Stage IIIc (T3, N3, M0) non-small cell lung cancer, adenocarcinoma presented with large left upper lobe lung mass in addition to left infrahilar and right hilar and subcarinal lymphadenopathy diagnosed in August 2021.   Molecular Biomarkers: Insufficient tissue for foundation 1. Guardant 360 results.  DETECTED ALTERATION(S) / BIOMARKER(S) % CFDNA OR AMPLIFICATION ASSOCIATED FDA-APPROVED THERAPIES CLINICAL TRIAL AVAILABILITY  EGFR L858R, 1.2%, Afatinib, Dacomitinib, Erlotinib, Gefitinib, Osimertinib, Ramucirumab Yes [id number], 1.0%, Afatinib, Dacomitinib, Erlotinib, Gefitinib, Osimertinib, Ramucirumab Yes [id number] 1.0% None  No   PRIOR THERAPY: Weekly concurrent chemoradiation with carboplatin for an AUC of 2 and paclitaxel 45 mg/m2.  First dose expected on 11/16/2019. Status post 7 cycles.   CURRENT THERAPY: Tagrisso 80 mg p.o. daily. First dose started 01/31/2020.  Status post 6 months of treatment.  INTERVAL HISTORY: Linda Woods 70 y.o. female returns to the clinic today for follow-up visit accompanied by her husband.  The patient is feeling fine today with no concerning complaints except for the baseline shortness of breath increased with exertion as well as dry cough and wheezing.  She also has few episodes of diarrhea.  She denied having any significant chest pain or hemoptysis.  She denied having any nausea, vomiting, abdominal pain or constipation.  She has no significant skin rash.  She has no recent weight loss or night sweats.  She has no fever or chills.  She continues to tolerate her treatment with Tagrisso fairly well.  The patient had repeat CT scan of the chest performed recently and she is here for evaluation and discussion of her risk her results.   MEDICAL HISTORY: Past Medical History:   Diagnosis Date   Anemia    as a child   Anxiety    Bursitis    Chronic reflux esophagitis    Complication of anesthesia    Diarrhea, functional    Diverticulosis    GERD (gastroesophageal reflux disease)    History of kidney stones    Hypertension    Knee pain    right knee-seeing ortho   lung ca 09/2019   Osteoarthritis    Plantar fasciitis    PONV (postoperative nausea and vomiting)    has used the patch before and that helps   Pure hypercholesterolemia    RLS (restless legs syndrome)    Superficial vein thrombosis    Thyroid disease    hypothyroid    ALLERGIES:  is allergic to crestor [rosuvastatin calcium].  MEDICATIONS:  Current Outpatient Medications  Medication Sig Dispense Refill   albuterol (VENTOLIN HFA) 108 (90 Base) MCG/ACT inhaler INHALE (2) PUFFS INTO THE LUNGS EVERY 6 HOURS AS NEEDED FOR WHEEZING OR SHORTNESS OF BREATH 8.5 g 0   azithromycin (ZITHROMAX) 250 MG tablet Take as directed 6 tablet 0   cholecalciferol (VITAMIN D3) 25 MCG (1000 UNIT) tablet Take 1,000 Units by mouth daily. Pt states she take 2000 units/day     diphenoxylate-atropine (LOMOTIL) 2.5-0.025 MG tablet Take 1 tablet by mouth daily as needed for diarrhea or loose stools.      famotidine (PEPCID) 40 MG tablet Take 40 mg by mouth See admin instructions. Five times a week     ferrous sulfate 325 (65 FE) MG tablet Take 325 mg by mouth daily with breakfast.     fexofenadine (ALLEGRA) 180 MG tablet Take 180 mg by  mouth daily.     fluticasone (FLONASE) 50 MCG/ACT nasal spray Place 2 sprays into both nostrils daily.     HYDROcodone-acetaminophen (NORCO/VICODIN) 5-325 MG tablet Take 0.5 tablets by mouth daily as needed (Knee pain).      HYDROcodone-homatropine (HYCODAN) 5-1.5 MG/5ML syrup Take 5 mLs by mouth daily as needed for cough.     ibuprofen (ADVIL) 200 MG tablet Take 400 mg by mouth every 6 (six) hours as needed (Knee pain).     lidocaine (LIDODERM) 5 % 1 patch daily.     LORazepam  (ATIVAN) 0.5 MG tablet Take 0.5 mg by mouth at bedtime.      magic mouthwash SOLN 5 to 10 ml swish and spit or swallow QID prn mouth and esophageal pain 240 mL 2   melatonin 5 MG TABS Take 5 mg by mouth at bedtime.     methylPREDNISolone (MEDROL DOSEPAK) 4 MG TBPK tablet Use as instructed. 21 tablet 0   metoprolol succinate (TOPROL XL) 50 MG 24 hr tablet Take 50 mg by mouth daily.      MYRBETRIQ 25 MG TB24 tablet Take 25 mg by mouth daily.     osimertinib mesylate (TAGRISSO) 80 MG tablet TAKE 1 TABLET BY MOUTH DAILY. 30 tablet 2   oxyCODONE-acetaminophen (PERCOCET/ROXICET) 5-325 MG tablet Take 1 tablet by mouth every 4 (four) hours as needed for severe pain. 45 tablet 0   oxymetazoline (AFRIN) 0.05 % nasal spray Place 1 spray into both nostrils daily as needed for congestion.     phenazopyridine (PYRIDIUM) 200 MG tablet Take 200 mg by mouth daily as needed.     prochlorperazine (COMPAZINE) 10 MG tablet Take 1 tablet (10 mg total) by mouth every 6 (six) hours as needed for nausea or vomiting. 30 tablet 0   RABEprazole (ACIPHEX) 20 MG tablet Take 1 tablet (20 mg total) by mouth 2 (two) times a week.     rosuvastatin (CRESTOR) 5 MG tablet Take 5 mg by mouth daily.     SYNTHROID 112 MCG tablet Take 112 mcg by mouth daily before breakfast.      vitamin B-12 (CYANOCOBALAMIN) 1000 MCG tablet Take 1,000 mcg by mouth daily.     No current facility-administered medications for this visit.   Facility-Administered Medications Ordered in Other Visits  Medication Dose Route Frequency Provider Last Rate Last Admin   sodium chloride (PF) 0.9 % injection             SURGICAL HISTORY:  Past Surgical History:  Procedure Laterality Date   APPENDECTOMY     BRONCHIAL BIOPSY  10/20/2019   Procedure: BRONCHIAL BIOPSIES;  Surgeon: Collene Gobble, MD;  Location: [facility] ENDOSCOPY;  Service: Pulmonary;;   BRONCHIAL BRUSHINGS  10/20/2019   Procedure: BRONCHIAL BRUSHINGS;  Surgeon: Collene Gobble, MD;  Location: [facility]  ENDOSCOPY;  Service: Pulmonary;;   BRONCHIAL NEEDLE ASPIRATION BIOPSY  10/20/2019   Procedure: BRONCHIAL NEEDLE ASPIRATION BIOPSIES;  Surgeon: Collene Gobble, MD;  Location: [place];  Service: Pulmonary;;   BRONCHIAL WASHINGS  10/20/2019   Procedure: BRONCHIAL WASHINGS;  Surgeon: Collene Gobble, MD;  Location: [facility] ENDOSCOPY;  Service: Pulmonary;;   CARPAL TUNNEL RELEASE Right 2011   Dr. Amedeo Plenty   COLPORRHAPHY     posterior   HAND SURGERY Right 2016   Nerve surgery, Dr. Amedeo Plenty   OVARIAN CYST REMOVAL     RECTOCELE REPAIR  2011   w/TVH and sling   TONSILLECTOMY     TONSILLECTOMY  TOTAL KNEE ARTHROPLASTY Left 09/09/2018   Procedure: TOTAL KNEE ARTHROPLASTY, CORTISONE INJECTION RIGHT KNEE;  Surgeon: Paralee Cancel, MD;  Location: [facility] ORS;  Service: Orthopedics;  Laterality: Left;  70 mins   TOTAL VAGINAL HYSTERECTOMY  10/18/2009   rectocele repair, sling   TUBAL LIGATION Bilateral    VARICOSE VEIN SURGERY     VIDEO BRONCHOSCOPY WITH ENDOBRONCHIAL NAVIGATION N/A 10/20/2019   Procedure: VIDEO BRONCHOSCOPY WITH ENDOBRONCHIAL NAVIGATION;  Surgeon: Collene Gobble, MD;  Location: [facility] ENDOSCOPY;  Service: Pulmonary;  Laterality: N/A;    REVIEW OF SYSTEMS:  Constitutional: positive for fatigue Eyes: negative Ears, nose, mouth, throat, and face: negative Respiratory: positive for cough, dyspnea on exertion, and wheezing Cardiovascular: negative Gastrointestinal: positive for diarrhea Genitourinary:negative Integument/breast: negative Hematologic/lymphatic: negative Musculoskeletal:negative Neurological: negative Behavioral/Psych: negative Endocrine: negative Allergic/Immunologic: negative   PHYSICAL EXAMINATION: General appearance: alert, cooperative, fatigued, and no distress Head: Normocephalic, without obvious abnormality, atraumatic Neck: no adenopathy, no JVD, supple, symmetrical, trachea midline, and thyroid not enlarged, symmetric, no tenderness/mass/nodules Lymph nodes:  Cervical, supraclavicular, and axillary nodes normal. Resp: wheezes bilaterally Back: symmetric, no curvature. ROM normal. No CVA tenderness. Cardio: regular rate and rhythm, S1, S2 normal, no murmur, click, rub or gallop GI: soft, non-tender; bowel sounds normal; no masses,  no organomegaly Extremities: extremities normal, atraumatic, no cyanosis or edema Neurologic: Alert and oriented X 3, normal strength and tone. Normal symmetric reflexes. Normal coordination and gait  ECOG PERFORMANCE STATUS: 1 - Symptomatic but completely ambulatory  Blood pressure 120/88, pulse 94, temperature (!) 96.1 F (35.6 C), temperature source Tympanic, resp. rate 20, height 5' 5" (1.651 m), weight 163 lb (73.9 kg), last menstrual period 08/21/2002, SpO2 100 %.  LABORATORY DATA: Lab Results  Component Value Date   WBC 7.1 08/08/2020   HGB 12.6 08/08/2020   HCT 36.9 08/08/2020   MCV 93.7 08/08/2020   PLT 107 (L) 08/08/2020      Chemistry      Component Value Date/Time   NA 139 08/08/2020 1105   K 4.3 08/08/2020 1105   CL 103 08/08/2020 1105   CO2 29 08/08/2020 1105   BUN 13 08/08/2020 1105   CREATININE 0.75 08/08/2020 1105      Component Value Date/Time   CALCIUM 9.4 08/08/2020 1105   ALKPHOS 57 08/08/2020 1105   AST 18 08/08/2020 1105   ALT 24 08/08/2020 1105   BILITOT 0.8 08/08/2020 1105       RADIOGRAPHIC STUDIES: CT Chest W Contrast  Result Date: 08/08/2020 CLINICAL DATA:  Non-small-cell lung cancer.  Restaging. EXAM: CT CHEST WITH CONTRAST TECHNIQUE: Multidetector CT imaging of the chest was performed during intravenous contrast administration. CONTRAST:  49m OMNIPAQUE IOHEXOL 300 MG/ML  SOLN COMPARISON:  04/08/2020 FINDINGS: Cardiovascular: The heart size is normal. No substantial pericardial effusion. Coronary artery calcification is evident. Atherosclerotic calcification is noted in the wall of the thoracic aorta. Mediastinum/Nodes: No mediastinal lymphadenopathy. There is no hilar  lymphadenopathy. The esophagus has normal imaging features. There is no axillary lymphadenopathy. Lungs/Pleura: Post radiation scarring in the medial left lung has evolved in the interval with central retraction in against the suprahilar mediastinum posteriorly. Interval of post radiation scarring in the parahilar right lung as also involved in the interval becoming more well-defined and showing some retraction in towards the hilum. Pleural effusions seen on the previous study have resolved in the interval. The patchy ground-glass opacity seen previously in the anterior right middle lobe has resolved in the interval. Scattered tiny peripheral right upper lobe  predominant nodules (image 39/7) are stable since prior. No new suspicious pulmonary nodule or mass. Upper Abdomen: 12 mm right adrenal nodule measured previously is stable in the interval. Right renal cyst has been incompletely visualized. Small hiatal hernia evident. Musculoskeletal: Tiny sclerotic foci in the T10 and T11 vertebral bodies described previously are stable in the interval. No new suspicious bony abnormality. IMPRESSION: 1. Interval evolution of post radiation scarring in the medial left lung and parahilar right lung. 2. Interval resolution of bilateral pleural effusions. 3. Patchy ground-glass opacity seen previously in the anterior right middle lobe has cleared in the interval. Tiny peripheral nodularity in the right upper lobe is stable. 4. Stable 12 mm right adrenal nodule, likely benign. 5. Aortic Atherosclerosis (ICD10-I70.0). Electronically Signed   By: Misty Stanley M.D.   On: 08/08/2020 14:21    ASSESSMENT AND PLAN: This is a very pleasant 70 years old white female recently diagnosed with a stage IIIc/IV (T3, N3, M0/M1a) non-small cell lung cancer, adenocarcinoma presented with large left upper lobe lung mass in addition to left infrahilar, right hilar and subcarinal lymphadenopathy and small left pleural effusion diagnosed in August  2021 with positive EGFR mutation in exon 21 (L858R). The patient completed a course of concurrent chemoradiation with weekly carboplatin and paclitaxel status post 7 cycles.  She tolerated her treatment well except for fatigue as well as a skin burn and cough. Her scan showed mild improvement of the left upper lobe lung mass as well as the mediastinal lymphadenopathy.  The patient continues to have small left pleural effusion that is suspicious given her presentation. She is currently on treatment with Tagrisso 80 mg p.o. daily started on January 31, 2020.  Status post 6 months of treatment. 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 She had repeat CT scan of the chest performed recently.  I personally and independently reviewed the scans and discussed the results with the patient and showed him the images today. Her scan showed no concerning findings for disease progression but she continues to have the evolving radiation changes in the lungs bilaterally. I recommended for her to continue her current treatment with Tagrisso with the same dose. I will see her back for follow-up visit in 1 months for evaluation and repeat blood work. For the wheezing and pulmonary radiation fibrosis, she is scheduled to see her pulmonologist soon for evaluation and recommendation regarding her condition. For the diarrhea, she will continue on Imodium on as-needed basis. The patient was advised to call immediately if she has any other concerning symptoms in the interval. The patient voices understanding of current disease status and treatment options and is in agreement with the current care plan.  All questions were answered. The patient knows to call the clinic with any problems, questions or concerns. We can certainly see the patient much sooner if  necessary.  Disclaimer: This note was dictated with voice recognition software. Similar sounding words can inadvertently be transcribed and may not be corrected upon review.

## 2020-08-09 ENCOUNTER — Encounter: Payer: Self-pay | Admitting: Adult Health

## 2020-08-09 ENCOUNTER — Ambulatory Visit: Payer: Medicare Other | Admitting: Adult Health

## 2020-08-09 ENCOUNTER — Encounter: Payer: Self-pay | Admitting: Internal Medicine

## 2020-08-09 DIAGNOSIS — R059 Cough, unspecified: Secondary | ICD-10-CM

## 2020-08-09 DIAGNOSIS — C3492 Malignant neoplasm of unspecified part of left bronchus or lung: Secondary | ICD-10-CM | POA: Diagnosis not present

## 2020-08-09 MED ORDER — STIOLTO RESPIMAT 2.5-2.5 MCG/ACT IN AERS: 2.0000 | INHALATION_SPRAY | Freq: Every day | RESPIRATORY_TRACT | 5 refills | Status: DC

## 2020-08-09 MED ORDER — PREDNISONE 10 MG PO TABS: ORAL_TABLET | ORAL | 0 refills | Status: DC

## 2020-08-09 NOTE — Assessment & Plan Note (Addendum)
Cough and wheezing x6 months-patient may be developing some radiation pneumonitis-plus or minus reactive airways. We will give a slow prednisone taper over the next 2 weeks.  Began a bronchodilator to see if this helps and continue cough control regimen  On return if improved consider PFT /Spirometry if cough is improved.  Also on Tagrisso - which can cause pneumonitis   Plan  Patient Instructions  Prednisone taper - take with food .  Begin Stiolto 2 puffs daily .  Albuterol inhaler As needed   Follow up with Dr. Lamonte Sakai  in 3-4 weeks and As needed   Please contact office for sooner follow up if symptoms do not improve or worsen or seek emergency care

## 2020-08-09 NOTE — Patient Instructions (Signed)
Prednisone taper - take with food .  Begin Stiolto 2 puffs daily .  Albuterol inhaler As needed   Follow up with Dr. Lamonte Sakai  in 3-4 weeks and As needed   Please contact office for sooner follow up if symptoms do not improve or worsen or seek emergency care

## 2020-08-09 NOTE — Telephone Encounter (Signed)
I spoke with pt and advised there is no appt or order for a CT scan in the system. Pt states maybe someone miss spoke and just wanted to make sure there was no appt.

## 2020-08-09 NOTE — Progress Notes (Signed)
@Patient  ID: Linda Woods, female    DOB: [birth year], 70 y.o.   MRN: [id number]  Chief Complaint  Patient presents with   Follow-up    Referring provider: [name], MD  HPI: 70 year old female never smoker seen for pulmonary consult October 16, 2019 for abnormal CT chest with a left upper lobe lung mass found to have adenocarcinoma of the lung, stage IIIc (large left upper lobe lung mass with a left infrahilar and right hilar and subcarinal lymphadenopathy) Followed by oncology undergoing chemo radiation (completed 7 cycles of carboplatin and paclitaxel , currently on Tagrisso-first dose January 31, 2020.  Completed SBRT January 06, 2020   TEST/EVENTS :  CT chest October 14, 2019 left upper lobe lung mass measuring 6.9 x 3.7 cm PET scan November 01, 2681 hypermetabolic left upper lobe lung mass, postobstructive pneumonitis in the left upper lobe, hypermetabolic adenopathy right hilar, left infrahilar and subcarinal MRI brain November 13, 2019 negative for metastatic disease CT chest August 08, 2020 showed post radiation scarring in the medial left lung which has evolved with central retraction.  Post radiation scarring in the right lung.  Resolved pleural effusions.  Patchy groundglass opacity in the right middle lobe has resolved.  Stable scattered right upper lobe tiny nodules.  No new suspicious pulmonary nodule.  08/09/2020 Follow up : Lung cancer , cough  Patient returns for a follow-up visit.  She was seen October 16, 2019 with an abnormal CT chest with a left upper lobe lung mass.  Work-up revealed an underlying stage IIIc large left upper lobe lung mass consistent with adenocarcinoma.  With a left infrahilar and right hilar and subcarinal involvement.  Patient was referred to oncology and radiation oncology.  As above she is undergoing treatment.  She completed 7 cycles are carboplatin and Paclitaxel.  She is currently on Tagrisso .  She completed SBRT and January 06 2020 .  Patient says she has tolerated treatments fairly well.  However she does complain that shortly after finishing radiation she started develop a cough.  Predominantly dry in nature intermittently can get up some clear mucus.  She is also had associated wheezing.  Initially after radiation she did have shortness of breath and decreased activity tolerance along with significant fatigue.  She does say that her fatigue and activity tolerance and dyspnea have all improved recently she is more back to her baseline and is walking and trying to get her energy level back up.  However her cough and wheezing continue to come and go.  She has been treated by a Medrol Dosepak last month which did have some improvement in symptoms.  She is also has some cough suppression medications it helps intermittently.  Prior to her diagnosis of lung cancer she did not have any history of asthma or COPD.  She is a lifelong never smoker.  She denies any hemoptysis unintentional weight loss chest pain orthopnea PND.  She does have a history of hiatal hernia.  It is on Aciphex and Pepcid which she alternates.  She denies any significant postnasal drainage. As above her most recent CT chest August 16, 2020 showed postradiation scarring in the medial left lung which has evolved with central retraction and post radiation scarring in the right lung.  There are resolved pleural effusions.  And a previously groundglass opacity in the right middle lobe has resolved.  Stable right upper lobe tiny nodules.  And no new suspicious pulmonary nodules.    Allergies  Allergen Reactions  Crestor [Rosuvastatin Calcium] Diarrhea    Immunization History  Administered Date(s) Administered   Fluad Quad(high Dose 65+) 11/18/2019   Influenza, High Dose Seasonal PF 11/18/2018   Moderna Sars-Covid-2 Vaccination 05/08/2019, 06/08/2019, 12/18/2019   PFIZER(Purple Top)SARS-COV-2 Vaccination 06/24/2020   Pneumococcal Conjugate-13 01/29/2018   Tdap  04/27/2009   Zoster Recombinat (Shingrix) 08/27/2018    Past Medical History:  Diagnosis Date   Anemia    as a child   Anxiety    Bursitis    Chronic reflux esophagitis    Complication of anesthesia    Diarrhea, functional    Diverticulosis    GERD (gastroesophageal reflux disease)    History of kidney stones    Hypertension    Knee pain    right knee-seeing ortho   lung ca 09/2019   Osteoarthritis    Plantar fasciitis    PONV (postoperative nausea and vomiting)    has used the patch before and that helps   Pure hypercholesterolemia    RLS (restless legs syndrome)    Superficial vein thrombosis    Thyroid disease    hypothyroid    Tobacco History: Social History   Tobacco Use  Smoking Status Never  Smokeless Tobacco Never   Counseling given: Not Answered   Outpatient Medications Prior to Visit  Medication Sig Dispense Refill   albuterol (VENTOLIN HFA) 108 (90 Base) MCG/ACT inhaler INHALE (2) PUFFS INTO THE LUNGS EVERY 6 HOURS AS NEEDED FOR WHEEZING OR SHORTNESS OF BREATH 8.5 g 0   azithromycin (ZITHROMAX) 250 MG tablet Take as directed 6 tablet 0   cholecalciferol (VITAMIN D3) 25 MCG (1000 UNIT) tablet Take 1,000 Units by mouth daily. Pt states she take 2000 units/day     diphenoxylate-atropine (LOMOTIL) 2.5-0.025 MG tablet Take 1 tablet by mouth daily as needed for diarrhea or loose stools.      famotidine (PEPCID) 40 MG tablet Take 40 mg by mouth See admin instructions. Five times a week     ferrous sulfate 325 (65 FE) MG tablet Take 325 mg by mouth daily with breakfast.     fexofenadine (ALLEGRA) 180 MG tablet Take 180 mg by mouth daily.     fluticasone (FLONASE) 50 MCG/ACT nasal spray Place 2 sprays into both nostrils daily.     HYDROcodone-acetaminophen (NORCO/VICODIN) 5-325 MG tablet Take 0.5 tablets by mouth daily as needed (Knee pain).      HYDROcodone-homatropine (HYCODAN) 5-1.5 MG/5ML syrup Take 5 mLs by mouth daily as needed for cough.     ibuprofen  (ADVIL) 200 MG tablet Take 400 mg by mouth every 6 (six) hours as needed (Knee pain).     lidocaine (LIDODERM) 5 % 1 patch daily.     LORazepam (ATIVAN) 0.5 MG tablet Take 0.5 mg by mouth at bedtime.      magic mouthwash SOLN 5 to 10 ml swish and spit or swallow QID prn mouth and esophageal pain 240 mL 2   melatonin 5 MG TABS Take 5 mg by mouth at bedtime.     MYRBETRIQ 25 MG TB24 tablet Take 25 mg by mouth daily.     osimertinib mesylate (TAGRISSO) 80 MG tablet TAKE 1 TABLET BY MOUTH DAILY. 30 tablet 2   oxyCODONE-acetaminophen (PERCOCET/ROXICET) 5-325 MG tablet Take 1 tablet by mouth every 4 (four) hours as needed for severe pain. 45 tablet 0   oxymetazoline (AFRIN) 0.05 % nasal spray Place 1 spray into both nostrils daily as needed for congestion.     phenazopyridine (PYRIDIUM) 200  MG tablet Take 200 mg by mouth daily as needed.     prochlorperazine (COMPAZINE) 10 MG tablet Take 1 tablet (10 mg total) by mouth every 6 (six) hours as needed for nausea or vomiting. 30 tablet 0   RABEprazole (ACIPHEX) 20 MG tablet Take 1 tablet (20 mg total) by mouth 2 (two) times a week.     rosuvastatin (CRESTOR) 5 MG tablet Take 5 mg by mouth daily.     SYNTHROID 112 MCG tablet Take 112 mcg by mouth daily before breakfast.      vitamin B-12 (CYANOCOBALAMIN) 1000 MCG tablet Take 1,000 mcg by mouth daily.     metoprolol succinate (TOPROL XL) 50 MG 24 hr tablet Take 50 mg by mouth daily.      methylPREDNISolone (MEDROL DOSEPAK) 4 MG TBPK tablet Use as instructed. (Patient not taking: Reported on 08/09/2020) 21 tablet 0   No facility-administered medications prior to visit.     Review of Systems:   Constitutional:   No  weight loss, night sweats,  Fevers, chills, fatigue, or  lassitude.  HEENT:   No headaches,  Difficulty swallowing,  Tooth/dental problems, or  Sore throat,                No sneezing, itching, ear ache, nasal congestion, post nasal drip,   CV:  No chest pain,  Orthopnea, PND, swelling in  lower extremities, anasarca, dizziness, palpitations, syncope.   GI  No heartburn, indigestion, abdominal pain, nausea, vomiting, diarrhea, change in bowel habits, loss of appetite, bloody stools.   Resp: No shortness of breath with exertion or at rest.  No excess mucus, no productive cough,  No non-productive cough,  No coughing up of blood.  No change in color of mucus.  No wheezing.  No chest wall deformity  Skin: no rash or lesions.  GU: no dysuria, change in color of urine, no urgency or frequency.  No flank pain, no hematuria   MS:  No joint pain or swelling.  No decreased range of motion.  No back pain.    Physical Exam  BP 110/60   Pulse 65   Ht 5\' 5"  (1.651 m)   Wt 161 lb (73 kg)   LMP 08/21/2002   SpO2 99%   BMI 26.79 kg/m   GEN: A/Ox3; pleasant , NAD, well nourished    HEENT:  [place]/AT,  EACs-clear, TMs-wnl, NOSE-clear, THROAT-clear, no lesions, no postnasal drip or exudate noted.   NECK:  Supple w/ fair ROM; no JVD; normal carotid impulses w/o bruits; no thyromegaly or nodules palpated; no lymphadenopathy.    RESP  few expiratory wheezes ,  no accessory muscle use, no dullness to percussion  CARD:  RRR, no m/r/g, no peripheral edema, pulses intact, no cyanosis or clubbing.  GI:   Soft & nt; nml bowel sounds; no organomegaly or masses detected.   Musco: Warm bil, no deformities or joint swelling noted.   Neuro: alert, no focal deficits noted.    Skin: Warm, no lesions or rashes    Lab Results:  CBC    Component Value Date/Time   WBC 7.1 08/08/2020 1105   WBC 2.9 (L) 01/04/2020 1049   RBC 3.94 08/08/2020 1105   HGB 12.6 08/08/2020 1105   HGB 13.5 05/21/2013 1526   HCT 36.9 08/08/2020 1105   PLT 107 (L) 08/08/2020 1105   MCV 93.7 08/08/2020 1105   MCH 32.0 08/08/2020 1105   MCHC 34.1 08/08/2020 1105   RDW 14.0 08/08/2020 1105  LYMPHSABS 1.1 08/08/2020 1105   MONOABS 0.6 08/08/2020 1105   EOSABS 0.1 08/08/2020 1105   BASOSABS 0.0 08/08/2020 1105     BMET    Component Value Date/Time   NA 139 08/08/2020 1105   K 4.3 08/08/2020 1105   CL 103 08/08/2020 1105   CO2 29 08/08/2020 1105   GLUCOSE 108 (H) 08/08/2020 1105   BUN 13 08/08/2020 1105   CREATININE 0.75 08/08/2020 1105   CALCIUM 9.4 08/08/2020 1105   GFRNONAA >60 08/08/2020 1105   GFRAA >60 11/23/2019 1102    BNP No results found for: BNP  ProBNP No results found for: PROBNP  Imaging: CT Chest W Contrast  Result Date: 08/08/2020 CLINICAL DATA:  Non-small-cell lung cancer.  Restaging. EXAM: CT CHEST WITH CONTRAST TECHNIQUE: Multidetector CT imaging of the chest was performed during intravenous contrast administration. CONTRAST:  62mL OMNIPAQUE IOHEXOL 300 MG/ML  SOLN COMPARISON:  04/08/2020 FINDINGS: Cardiovascular: The heart size is normal. No substantial pericardial effusion. Coronary artery calcification is evident. Atherosclerotic calcification is noted in the wall of the thoracic aorta. Mediastinum/Nodes: No mediastinal lymphadenopathy. There is no hilar lymphadenopathy. The esophagus has normal imaging features. There is no axillary lymphadenopathy. Lungs/Pleura: Post radiation scarring in the medial left lung has evolved in the interval with central retraction in against the suprahilar mediastinum posteriorly. Interval of post radiation scarring in the parahilar right lung as also involved in the interval becoming more well-defined and showing some retraction in towards the hilum. Pleural effusions seen on the previous study have resolved in the interval. The patchy ground-glass opacity seen previously in the anterior right middle lobe has resolved in the interval. Scattered tiny peripheral right upper lobe predominant nodules (image 39/7) are stable since prior. No new suspicious pulmonary nodule or mass. Upper Abdomen: 12 mm right adrenal nodule measured previously is stable in the interval. Right renal cyst has been incompletely visualized. Small hiatal hernia evident.  Musculoskeletal: Tiny sclerotic foci in the T10 and T11 vertebral bodies described previously are stable in the interval. No new suspicious bony abnormality. IMPRESSION: 1. Interval evolution of post radiation scarring in the medial left lung and parahilar right lung. 2. Interval resolution of bilateral pleural effusions. 3. Patchy ground-glass opacity seen previously in the anterior right middle lobe has cleared in the interval. Tiny peripheral nodularity in the right upper lobe is stable. 4. Stable 12 mm right adrenal nodule, likely benign. 5. Aortic Atherosclerosis (ICD10-I70.0). Electronically Signed   By: Misty Stanley M.D.   On: 08/08/2020 14:21      No flowsheet data found.  No results found for: NITRICOXIDE      Assessment & Plan:   Cough Cough and wheezing x6 months-patient may be developing some radiation pneumonitis-plus or minus reactive airways. We will give a slow prednisone taper over the next 2 weeks.  Began a bronchodilator to see if this helps and continue cough control regimen  On return if improved consider PFT /Spirometry if cough is improved.  Also on Tagrisso - which can cause pneumonitis   Plan  Patient Instructions  Prednisone taper - take with food .  Begin Stiolto 2 puffs daily .  Albuterol inhaler As needed   Follow up with Dr. Lamonte Sakai  in 3-4 weeks and As needed   Please contact office for sooner follow up if symptoms do not improve or worsen or seek emergency care       Adenocarcinoma of left lung, stage 3 ([place]) Continue follow up with Oncology and current  regimen    I spent  36  minutes dedicated to the care of this patient on the date of this encounter to include pre-visit review of records, face-to-face time with the patient discussing conditions above, post visit ordering of testing, clinical documentation with the electronic health record, making appropriate referrals as documented, and communicating necessary findings to members of the patients  care team.    Rexene Edison, NP 08/09/2020

## 2020-08-09 NOTE — Assessment & Plan Note (Signed)
Continue follow up with Oncology and current regimen

## 2020-08-16 ENCOUNTER — Other Ambulatory Visit (HOSPITAL_COMMUNITY): Payer: Self-pay

## 2020-08-17 ENCOUNTER — Other Ambulatory Visit (HOSPITAL_COMMUNITY): Payer: Self-pay

## 2020-09-01 ENCOUNTER — Ambulatory Visit: Payer: Medicare Other | Admitting: Emergency Medicine

## 2020-09-01 ENCOUNTER — Encounter: Payer: Self-pay | Admitting: Emergency Medicine

## 2020-09-01 ENCOUNTER — Other Ambulatory Visit: Payer: Self-pay

## 2020-09-01 DIAGNOSIS — R059 Cough, unspecified: Secondary | ICD-10-CM

## 2020-09-01 DIAGNOSIS — C3492 Malignant neoplasm of unspecified part of left bronchus or lung: Secondary | ICD-10-CM

## 2020-09-01 NOTE — Assessment & Plan Note (Signed)
No reason to stop her Tagrisso at this time.  She is following Dr. Julien Nordmann, will have her surveillance CT scan as per his plans.

## 2020-09-01 NOTE — Assessment & Plan Note (Signed)
She has breakthrough GERD, stridor on exam.  They have been working to try to minimize her PPI exposure to prevent long-term side effects.  She denies much active rhinitis although occasionally she does have clear drainage.  She is on Allegra for this.  I will ask her to increase her GERD therapy until next visit, see if she gets benefit.  In absence of any evidence for obstructive disease I will stop the Stiolto, continue the albuterol as needed.  Obtain pulmonary function testing.  No clear evidence for Tagrisso related pneumonitis on her most recent CT scan.  We will stop Stiolto for now Keep albuterol available to use 2 puffs if needed for shortness of breath, chest tightness, spells of coughing. We will arrange for pulmonary function testing at your next office visit Increase your Aciphex to once daily every day until next visit.  Take this 1 hour around food Start taking your Pepcid every evening until next visit Continue Allegra once daily Depending on your symptoms and your CT scans we will decide whether you will be able to stay on [place].  For now I do not see any reason why it needs to be held.  Follow with Dr. Lamonte Sakai next available with full pulmonary function testing on the same day.

## 2020-09-01 NOTE — Patient Instructions (Addendum)
We will stop Stiolto for now Keep albuterol available to use 2 puffs if needed for shortness of breath, chest tightness, spells of coughing. We will arrange for pulmonary function testing at your next office visit Increase your Aciphex to once daily every day until next visit.  Take this 1 hour around food Start taking your Pepcid every evening until next visit Continue Allegra once daily Get your next CT scan as planned by Dr Julien Nordmann Depending on your symptoms and your CT scans we will decide whether you will be able to stay on [place].  For now I do not see any reason why it needs to be held.  Follow with Dr. Lamonte Sakai next available with full pulmonary function testing on the same day.

## 2020-09-01 NOTE — Addendum Note (Signed)
Addended by: Gavin Potters R on: 09/01/2020 02:25 PM   Modules accepted: Orders

## 2020-09-01 NOTE — Progress Notes (Signed)
Subjective:    Patient ID: Linda Woods, female    DOB: 12/24/1950, 70 y.o.   MRN: [id number]  HPI  ROV 11/18/19 --70 year old woman who follows up today for abnormal CT scan of the neck and chest as above.  She underwent navigational bronchoscopy on 10/20/2019, diagnosed with adenocarcinoma and in the left upper lobe.  Course complicated by pneumothorax requiring chest tube. PET scan performed on 11/02/2019 reviewed by me, shows hypermetabolic left upper lobe mass, small right adrenal mass that was not hypermetabolic, left level 5 lymph node not hypermetabolic.  MRI brain done on 11/13/2019 showed no evidence intracranial metastatic disease.  She has seen Dr. Julien Nordmann and is currently getting chemoradiation with Dr Sondra Come. Planning for 6-7 weeks chemo.   R OV 09/01/2020 --follow-up visit for 70 year old woman, never smoker, whom I saw with abnormal CT scan of the chest.  She was diagnosed with stage IIIc adenocarcinoma by navigational bronchoscopy 10/20/2019.  She has undergone chemoradiation, then started Tagrisso 01/31/2020.  She has been dealing with persistent cough, and that has been some question of possible pneumonitis related to either her XRT or more likely her Tagrisso.  She was treated with corticosteroids in May with some transient improvement and then again 08/09/2020. On the more recent taper unclear that she got much benefit.  She has a hiatal hernia with GERD and uses Aciphex/Pepcid.  She was empirically started on Stiolto 6/21 to see if she would get benefit, she reports that it may have changed the cough somewhat. She uses albuterol  - again may help her breathing some, unclear how much.  Her cough is daily, not always productive. She has reflux daily, feels a burning up to her throat. She is on aciphex but only 2x a week. Rotates with her pepcid. Minimal nasal congestion - but can happen. She is on allegra daily, has flonase but uses prn.   CT chest 08/08/2020 reviewed by me, shows  interval evolution of postradiation scarring in the medial left lung and perihilar right lung, resolved pleural effusions, clearing of patchy anterior right middle lobe groundglass infiltrate .  Review of Systems As per HPI     Objective:   Physical Exam Vitals:   09/01/20 1353  BP: 106/68  Pulse: 96  Temp: 97.6 F (36.4 C)  TempSrc: Oral  SpO2: 96%  Weight: 164 lb (74.4 kg)  Height: 5\' 5"  (1.651 m)   Gen: Pleasant, well-nourished, in no distress,  normal affect  ENT: No lesions,  mouth clear,  oropharynx clear, no postnasal drip.   Neck: No JVD, stridor heard on exam, especially on expiration  Lungs: No use of accessory muscles, no crackles or wheezing on normal respiration, no wheeze on forced expiration.  Referred upper airway noise  Cardiovascular: RRR, heart sounds normal, no murmur or gallops, no peripheral edema  Musculoskeletal: No deformities, no cyanosis or clubbing.    Neuro: alert, awake, non focal  Skin: Warm, no lesions or rash     Assessment & Plan:  Cough She has breakthrough GERD, stridor on exam.  They have been working to try to minimize her PPI exposure to prevent long-term side effects.  She denies much active rhinitis although occasionally she does have clear drainage.  She is on Allegra for this.  I will ask her to increase her GERD therapy until next visit, see if she gets benefit.  In absence of any evidence for obstructive disease I will stop the Stiolto, continue the albuterol as needed.  Obtain  pulmonary function testing.  No clear evidence for Tagrisso related pneumonitis on her most recent CT scan.  We will stop Stiolto for now Keep albuterol available to use 2 puffs if needed for shortness of breath, chest tightness, spells of coughing. We will arrange for pulmonary function testing at your next office visit Increase your Aciphex to once daily every day until next visit.  Take this 1 hour around food Start taking your Pepcid every evening  until next visit Continue Allegra once daily Depending on your symptoms and your CT scans we will decide whether you will be able to stay on [place].  For now I do not see any reason why it needs to be held.  Follow with Dr. Lamonte Sakai next available with full pulmonary function testing on the same day.   Adenocarcinoma of left lung, stage 3 ([facility]) No reason to stop her Tagrisso at this time.  She is following Dr. Julien Nordmann, will have her surveillance CT scan as per his plans.  Time spent 35 minutes  Baltazar Apo, MD, PhD 09/01/2020, 2:16 PM [facility] [phone] or if no answer [phone]

## 2020-09-02 ENCOUNTER — Other Ambulatory Visit: Payer: Medicare Other

## 2020-09-05 ENCOUNTER — Inpatient Hospital Stay: Payer: Medicare Other

## 2020-09-05 ENCOUNTER — Inpatient Hospital Stay: Payer: Medicare Other | Attending: Internal Medicine | Admitting: Internal Medicine

## 2020-09-05 ENCOUNTER — Other Ambulatory Visit: Payer: Self-pay

## 2020-09-05 VITALS — BP 106/67 | HR 82 | Temp 97.0°F | Resp 18 | Ht 65.0 in | Wt 165.3 lb

## 2020-09-05 DIAGNOSIS — R197 Diarrhea, unspecified: Secondary | ICD-10-CM | POA: Insufficient documentation

## 2020-09-05 DIAGNOSIS — C3492 Malignant neoplasm of unspecified part of left bronchus or lung: Secondary | ICD-10-CM

## 2020-09-05 DIAGNOSIS — C3412 Malignant neoplasm of upper lobe, left bronchus or lung: Secondary | ICD-10-CM | POA: Insufficient documentation

## 2020-09-05 DIAGNOSIS — Z5111 Encounter for antineoplastic chemotherapy: Secondary | ICD-10-CM

## 2020-09-05 LAB — CBC WITH DIFFERENTIAL (CANCER CENTER ONLY)
Abs Immature Granulocytes: 0.01 10*3/uL (ref 0.00–0.07)
Basophils Absolute: 0 10*3/uL (ref 0.0–0.1)
Basophils Relative: 0 %
Eosinophils Absolute: 0.2 10*3/uL (ref 0.0–0.5)
Eosinophils Relative: 4 %
HCT: 33.9 % — ABNORMAL LOW (ref 36.0–46.0)
Hemoglobin: 11.5 g/dL — ABNORMAL LOW (ref 12.0–15.0)
Immature Granulocytes: 0 %
Lymphocytes Relative: 17 %
Lymphs Abs: 0.8 10*3/uL (ref 0.7–4.0)
MCH: 32.3 pg (ref 26.0–34.0)
MCHC: 33.9 g/dL (ref 30.0–36.0)
MCV: 95.2 fL (ref 80.0–100.0)
Monocytes Absolute: 0.5 10*3/uL (ref 0.1–1.0)
Monocytes Relative: 11 %
Neutro Abs: 3.3 10*3/uL (ref 1.7–7.7)
Neutrophils Relative %: 68 %
Platelet Count: 106 10*3/uL — ABNORMAL LOW (ref 150–400)
RBC: 3.56 MIL/uL — ABNORMAL LOW (ref 3.87–5.11)
RDW: 14.2 % (ref 11.5–15.5)
WBC Count: 4.8 10*3/uL (ref 4.0–10.5)
nRBC: 0 % (ref 0.0–0.2)

## 2020-09-05 LAB — CMP (CANCER CENTER ONLY)
ALT: 16 U/L (ref 0–44)
AST: 16 U/L (ref 15–41)
Albumin: 3.6 g/dL (ref 3.5–5.0)
Alkaline Phosphatase: 58 U/L (ref 38–126)
Anion gap: 8 (ref 5–15)
BUN: 12 mg/dL (ref 8–23)
CO2: 26 mmol/L (ref 22–32)
Calcium: 8.8 mg/dL — ABNORMAL LOW (ref 8.9–10.3)
Chloride: 106 mmol/L (ref 98–111)
Creatinine: 0.95 mg/dL (ref 0.44–1.00)
GFR, Estimated: >60 mL/min (ref >60)
Glucose, Bld: 101 mg/dL — ABNORMAL HIGH (ref 70–99)
Potassium: 4.3 mmol/L (ref 3.5–5.1)
Sodium: 140 mmol/L (ref 135–145)
Total Bilirubin: 0.4 mg/dL (ref 0.3–1.2)
Total Protein: 6.3 g/dL — ABNORMAL LOW (ref 6.5–8.1)

## 2020-09-05 NOTE — Progress Notes (Signed)
[place] [phone]   [phone]  OFFICE PROGRESS NOTE  [name], MD [place] [address]  DIAGNOSIS: Stage IIIc (T3, N3, M0) non-small cell lung cancer, adenocarcinoma presented with large left upper lobe lung mass in addition to left infrahilar and right hilar and subcarinal lymphadenopathy diagnosed in August 2021.   Molecular Biomarkers: Insufficient tissue for foundation 1. Guardant 360 results.  DETECTED ALTERATION(S) / BIOMARKER(S) % CFDNA OR AMPLIFICATION ASSOCIATED FDA-APPROVED THERAPIES CLINICAL TRIAL AVAILABILITY  EGFR L858R, 1.2%, Afatinib, Dacomitinib, Erlotinib, Gefitinib, Osimertinib, Ramucirumab Yes [id number], 1.0%, Afatinib, Dacomitinib, Erlotinib, Gefitinib, Osimertinib, Ramucirumab Yes [id number] 1.0% None  No   PRIOR THERAPY: Weekly concurrent chemoradiation with carboplatin for an AUC of 2 and paclitaxel 45 mg/m2.  First dose expected on 11/16/2019. Status post 7 cycles.   CURRENT THERAPY: Tagrisso 80 mg p.o. daily. First dose started 01/31/2020.  Status post 7 months of treatment.  INTERVAL HISTORY: Linda Woods 70 y.o. female returns to the clinic today for follow-up visit.  The patient is feeling fine today with no concerning complaints except for the persistent dry cough and shortness of breath with exertion.  She has no chest pain or hemoptysis.  She denied having any nausea, vomiting, diarrhea or constipation.  She has no headache or visual changes.  She was seen recently by Dr. Lamonte Sakai for evaluation of her breathing problem and is thought it may be related to acid reflux and changed her medication for the acid reflux.  She is currently on Aciphex and Pepcid.  She continues to tolerate her treatment with Tagrisso fairly well.  The patient is here today for evaluation and repeat blood work.   MEDICAL HISTORY: Past Medical History:  Diagnosis Date   Anemia    as a child   Anxiety     Bursitis    Chronic reflux esophagitis    Complication of anesthesia    Diarrhea, functional    Diverticulosis    GERD (gastroesophageal reflux disease)    History of kidney stones    Hypertension    Knee pain    right knee-seeing ortho   lung ca 09/2019   Osteoarthritis    Plantar fasciitis    PONV (postoperative nausea and vomiting)    has used the patch before and that helps   Pure hypercholesterolemia    RLS (restless legs syndrome)    Superficial vein thrombosis    Thyroid disease    hypothyroid    ALLERGIES:  is allergic to crestor [rosuvastatin calcium].  MEDICATIONS:  Current Outpatient Medications  Medication Sig Dispense Refill   albuterol (VENTOLIN HFA) 108 (90 Base) MCG/ACT inhaler INHALE (2) PUFFS INTO THE LUNGS EVERY 6 HOURS AS NEEDED FOR WHEEZING OR SHORTNESS OF BREATH 8.5 g 0   cholecalciferol (VITAMIN D3) 25 MCG (1000 UNIT) tablet Take 1,000 Units by mouth daily. Pt states she take 2000 units/day     famotidine (PEPCID) 40 MG tablet Take 40 mg by mouth See admin instructions. 4 times a week     ferrous sulfate 325 (65 FE) MG tablet Take 325 mg by mouth daily with breakfast.     fexofenadine (ALLEGRA) 180 MG tablet Take 180 mg by mouth daily.     fluticasone (FLONASE) 50 MCG/ACT nasal spray Place 2 sprays into both nostrils daily. (Patient not taking: Reported on 09/01/2020)     HYDROcodone-acetaminophen (NORCO/VICODIN) 5-325 MG tablet Take 0.5 tablets by mouth daily as needed (Knee pain).  (  Patient not taking: Reported on 09/01/2020)     HYDROcodone-homatropine (HYCODAN) 5-1.5 MG/5ML syrup Take 5 mLs by mouth daily as needed for cough.     ibuprofen (ADVIL) 200 MG tablet Take 400 mg by mouth every 6 (six) hours as needed (Knee pain).     lidocaine (LIDODERM) 5 % 1 patch daily.     LORazepam (ATIVAN) 0.5 MG tablet Take 0.5 mg by mouth at bedtime.      magic mouthwash SOLN 5 to 10 ml swish and spit or swallow QID prn mouth and esophageal pain (Patient not taking:  Reported on 09/01/2020) 240 mL 2   melatonin 5 MG TABS Take 5 mg by mouth at bedtime.     metoprolol succinate (TOPROL XL) 50 MG 24 hr tablet Take 50 mg by mouth daily.      MYRBETRIQ 25 MG TB24 tablet Take 25 mg by mouth daily.     osimertinib mesylate (TAGRISSO) 80 MG tablet TAKE 1 TABLET BY MOUTH DAILY. 30 tablet 2   oxyCODONE-acetaminophen (PERCOCET/ROXICET) 5-325 MG tablet Take 1 tablet by mouth every 4 (four) hours as needed for severe pain. 45 tablet 0   oxymetazoline (AFRIN) 0.05 % nasal spray Place 1 spray into both nostrils daily as needed for congestion.     phenazopyridine (PYRIDIUM) 200 MG tablet Take 200 mg by mouth daily as needed.     prochlorperazine (COMPAZINE) 10 MG tablet Take 1 tablet (10 mg total) by mouth every 6 (six) hours as needed for nausea or vomiting. 30 tablet 0   RABEprazole (ACIPHEX) 20 MG tablet Take 1 tablet (20 mg total) by mouth 2 (two) times a week.     rosuvastatin (CRESTOR) 5 MG tablet Take 5 mg by mouth daily.     SYNTHROID 112 MCG tablet Take 112 mcg by mouth daily before breakfast.      vitamin B-12 (CYANOCOBALAMIN) 1000 MCG tablet Take 1,000 mcg by mouth daily.     No current facility-administered medications for this visit.    SURGICAL HISTORY:  Past Surgical History:  Procedure Laterality Date   APPENDECTOMY     BRONCHIAL BIOPSY  10/20/2019   Procedure: BRONCHIAL BIOPSIES;  Surgeon: Collene Gobble, MD;  Location: [facility] ENDOSCOPY;  Service: Pulmonary;;   BRONCHIAL BRUSHINGS  10/20/2019   Procedure: BRONCHIAL BRUSHINGS;  Surgeon: Collene Gobble, MD;  Location: [facility] ENDOSCOPY;  Service: Pulmonary;;   BRONCHIAL NEEDLE ASPIRATION BIOPSY  10/20/2019   Procedure: BRONCHIAL NEEDLE ASPIRATION BIOPSIES;  Surgeon: Collene Gobble, MD;  Location: [facility] ENDOSCOPY;  Service: Pulmonary;;   BRONCHIAL WASHINGS  10/20/2019   Procedure: BRONCHIAL WASHINGS;  Surgeon: Collene Gobble, MD;  Location: [facility] ENDOSCOPY;  Service: Pulmonary;;   CARPAL TUNNEL RELEASE Right 2011    Dr. Amedeo Plenty   COLPORRHAPHY     posterior   HAND SURGERY Right 2016   Nerve surgery, Dr. Amedeo Plenty   OVARIAN CYST REMOVAL     RECTOCELE REPAIR  2011   w/TVH and sling   TONSILLECTOMY     TONSILLECTOMY     TOTAL KNEE ARTHROPLASTY Left 09/09/2018   Procedure: TOTAL KNEE ARTHROPLASTY, CORTISONE INJECTION RIGHT KNEE;  Surgeon: Paralee Cancel, MD;  Location: [facility] ORS;  Service: Orthopedics;  Laterality: Left;  70 mins   TOTAL VAGINAL HYSTERECTOMY  10/18/2009   rectocele repair, sling   TUBAL LIGATION Bilateral    VARICOSE VEIN SURGERY     VIDEO BRONCHOSCOPY WITH ENDOBRONCHIAL NAVIGATION N/A 10/20/2019   Procedure: VIDEO BRONCHOSCOPY WITH ENDOBRONCHIAL NAVIGATION;  Surgeon:  Collene Gobble, MD;  Location: [facility] ENDOSCOPY;  Service: Pulmonary;  Laterality: N/A;    REVIEW OF SYSTEMS:  A comprehensive review of systems was negative except for: Constitutional: positive for fatigue Respiratory: positive for cough and dyspnea on exertion Gastrointestinal: positive for diarrhea   PHYSICAL EXAMINATION: General appearance: alert, cooperative, fatigued, and no distress Head: Normocephalic, without obvious abnormality, atraumatic Neck: no adenopathy, no JVD, supple, symmetrical, trachea midline, and thyroid not enlarged, symmetric, no tenderness/mass/nodules Lymph nodes: Cervical, supraclavicular, and axillary nodes normal. Resp: wheezes bilaterally Back: symmetric, no curvature. ROM normal. No CVA tenderness. Cardio: regular rate and rhythm, S1, S2 normal, no murmur, click, rub or gallop GI: soft, non-tender; bowel sounds normal; no masses,  no organomegaly Extremities: extremities normal, atraumatic, no cyanosis or edema  ECOG PERFORMANCE STATUS: 1 - Symptomatic but completely ambulatory  Blood pressure 106/67, pulse 82, temperature (!) 97 F (36.1 C), temperature source Tympanic, resp. rate 18, height '5\' 5"'  (1.651 m), weight 165 lb 4.8 oz (75 kg), last menstrual period 08/21/2002, SpO2 96 %.  LABORATORY  DATA: Lab Results  Component Value Date   WBC 4.8 09/05/2020   HGB 11.5 (L) 09/05/2020   HCT 33.9 (L) 09/05/2020   MCV 95.2 09/05/2020   PLT 106 (L) 09/05/2020      Chemistry      Component Value Date/Time   NA 140 09/05/2020 1008   K 4.3 09/05/2020 1008   CL 106 09/05/2020 1008   CO2 26 09/05/2020 1008   BUN 12 09/05/2020 1008   CREATININE 0.95 09/05/2020 1008      Component Value Date/Time   CALCIUM 8.8 (L) 09/05/2020 1008   ALKPHOS 58 09/05/2020 1008   AST 16 09/05/2020 1008   ALT 16 09/05/2020 1008   BILITOT 0.4 09/05/2020 1008       RADIOGRAPHIC STUDIES: CT Chest W Contrast  Result Date: 08/08/2020 CLINICAL DATA:  Non-small-cell lung cancer.  Restaging. EXAM: CT CHEST WITH CONTRAST TECHNIQUE: Multidetector CT imaging of the chest was performed during intravenous contrast administration. CONTRAST:  103m OMNIPAQUE IOHEXOL 300 MG/ML  SOLN COMPARISON:  04/08/2020 FINDINGS: Cardiovascular: The heart size is normal. No substantial pericardial effusion. Coronary artery calcification is evident. Atherosclerotic calcification is noted in the wall of the thoracic aorta. Mediastinum/Nodes: No mediastinal lymphadenopathy. There is no hilar lymphadenopathy. The esophagus has normal imaging features. There is no axillary lymphadenopathy. Lungs/Pleura: Post radiation scarring in the medial left lung has evolved in the interval with central retraction in against the suprahilar mediastinum posteriorly. Interval of post radiation scarring in the parahilar right lung as also involved in the interval becoming more well-defined and showing some retraction in towards the hilum. Pleural effusions seen on the previous study have resolved in the interval. The patchy ground-glass opacity seen previously in the anterior right middle lobe has resolved in the interval. Scattered tiny peripheral right upper lobe predominant nodules (image 39/7) are stable since prior. No new suspicious pulmonary nodule or  mass. Upper Abdomen: 12 mm right adrenal nodule measured previously is stable in the interval. Right renal cyst has been incompletely visualized. Small hiatal hernia evident. Musculoskeletal: Tiny sclerotic foci in the T10 and T11 vertebral bodies described previously are stable in the interval. No new suspicious bony abnormality. IMPRESSION: 1. Interval evolution of post radiation scarring in the medial left lung and parahilar right lung. 2. Interval resolution of bilateral pleural effusions. 3. Patchy ground-glass opacity seen previously in the anterior right middle lobe has cleared in the interval. Tiny peripheral  nodularity in the right upper lobe is stable. 4. Stable 12 mm right adrenal nodule, likely benign. 5. Aortic Atherosclerosis (ICD10-I70.0). Electronically Signed   By: Misty Stanley M.D.   On: 08/08/2020 14:21    ASSESSMENT AND PLAN: This is a very pleasant 70 years old white female recently diagnosed with a stage IIIc/IV (T3, N3, M0/M1a) non-small cell lung cancer, adenocarcinoma presented with large left upper lobe lung mass in addition to left infrahilar, right hilar and subcarinal lymphadenopathy and small left pleural effusion diagnosed in August 2021 with positive EGFR mutation in exon 21 (L858R). The patient completed a course of concurrent chemoradiation with weekly carboplatin and paclitaxel status post 7 cycles.  She tolerated her treatment well except for fatigue as well as a skin burn and cough. Her scan showed mild improvement of the left upper lobe lung mass as well as the mediastinal lymphadenopathy.  The patient continues to have small left pleural effusion that is suspicious given her presentation. She is currently on treatment with Tagrisso 80 mg p.o. daily started on January 31, 2020.  Status post 7 months of treatment. The patient continues to tolerate this treatment well except for few episodes of diarrhea improved with Imodium. I recommended for her to continue her current  treatment with Tagrisso with the same dose. I will see her back for follow-up visit in 1 months for evaluation and repeat blood work. For the wheezing and pulmonary radiation fibrosis, she is currently followed by Dr. Lamonte Sakai and treated for GERD.  She is expected to have pulmonary function test next month. For the diarrhea, she will continue on Imodium on as-needed basis. The patient was advised to call immediately if she has any other concerning symptoms in the interval. The patient voices understanding of current disease status and treatment options and is in agreement with the current care plan.  All questions were answered. The patient knows to call the clinic with any problems, questions or concerns. We can certainly see the patient much sooner if necessary.  Disclaimer: This note was dictated with voice recognition software. Similar sounding words can inadvertently be transcribed and may not be corrected upon review.

## 2020-09-13 ENCOUNTER — Other Ambulatory Visit (HOSPITAL_COMMUNITY): Payer: Self-pay

## 2020-09-15 ENCOUNTER — Other Ambulatory Visit (HOSPITAL_COMMUNITY): Payer: Self-pay

## 2020-09-16 ENCOUNTER — Other Ambulatory Visit (HOSPITAL_COMMUNITY): Payer: Self-pay

## 2020-09-16 ENCOUNTER — Telehealth: Payer: Self-pay | Admitting: Emergency Medicine

## 2020-09-16 NOTE — Telephone Encounter (Signed)
Pt is returning phone call from Lattie Haw pt is unsure as to the nature of the call. Pls regard; [phone]

## 2020-09-16 NOTE — Telephone Encounter (Signed)
I do not see that Linda Woods called this pt  There are not any outstanding results or appts to be scheduled  I called and left her a detailed msg letting her know I am unsure of reason someone would have called her  I advised she can call back if there is something that she is needing  Will close encounter

## 2020-09-18 ENCOUNTER — Encounter: Payer: Self-pay | Admitting: Internal Medicine

## 2020-09-19 ENCOUNTER — Telehealth: Payer: Self-pay | Admitting: Internal Medicine

## 2020-09-19 ENCOUNTER — Other Ambulatory Visit (HOSPITAL_COMMUNITY): Payer: Self-pay

## 2020-09-19 NOTE — Telephone Encounter (Signed)
R/s appts per 8/1 sch msg. Pt aware.

## 2020-09-20 ENCOUNTER — Other Ambulatory Visit (HOSPITAL_COMMUNITY): Payer: Self-pay

## 2020-10-10 ENCOUNTER — Ambulatory Visit: Payer: Medicare Other | Admitting: Internal Medicine

## 2020-10-10 ENCOUNTER — Other Ambulatory Visit: Payer: Medicare Other

## 2020-10-12 ENCOUNTER — Ambulatory Visit: Payer: Medicare Other | Admitting: Emergency Medicine

## 2020-10-18 ENCOUNTER — Other Ambulatory Visit: Payer: Self-pay | Admitting: Internal Medicine

## 2020-10-18 ENCOUNTER — Other Ambulatory Visit (HOSPITAL_COMMUNITY): Payer: Self-pay

## 2020-10-18 DIAGNOSIS — C3492 Malignant neoplasm of unspecified part of left bronchus or lung: Secondary | ICD-10-CM

## 2020-10-18 MED ORDER — OSIMERTINIB MESYLATE 80 MG PO TABS
ORAL_TABLET | Freq: Every day | ORAL | 2 refills | Status: DC
Start: 2020-10-18 — End: 2021-01-13
  Filled 2020-10-25: qty 30, 30d supply, fill #0
  Filled 2020-11-17: qty 30, 30d supply, fill #1
  Filled 2020-12-20: qty 30, 30d supply, fill #2

## 2020-10-19 ENCOUNTER — Ambulatory Visit: Payer: Medicare Other | Admitting: Emergency Medicine

## 2020-10-19 ENCOUNTER — Other Ambulatory Visit: Payer: Self-pay

## 2020-10-19 ENCOUNTER — Inpatient Hospital Stay: Payer: Medicare Other | Admitting: Internal Medicine

## 2020-10-19 ENCOUNTER — Inpatient Hospital Stay: Payer: Medicare Other | Attending: Internal Medicine

## 2020-10-19 ENCOUNTER — Ambulatory Visit (INDEPENDENT_AMBULATORY_CARE_PROVIDER_SITE_OTHER): Payer: Medicare Other | Admitting: Emergency Medicine

## 2020-10-19 ENCOUNTER — Encounter: Payer: Self-pay | Admitting: Emergency Medicine

## 2020-10-19 VITALS — BP 119/81 | HR 93 | Temp 97.7°F | Resp 19 | Ht 65.5 in | Wt 167.1 lb

## 2020-10-19 DIAGNOSIS — J90 Pleural effusion, not elsewhere classified: Secondary | ICD-10-CM | POA: Insufficient documentation

## 2020-10-19 DIAGNOSIS — R059 Cough, unspecified: Secondary | ICD-10-CM

## 2020-10-19 DIAGNOSIS — E039 Hypothyroidism, unspecified: Secondary | ICD-10-CM | POA: Insufficient documentation

## 2020-10-19 DIAGNOSIS — J45909 Unspecified asthma, uncomplicated: Secondary | ICD-10-CM

## 2020-10-19 DIAGNOSIS — Z5111 Encounter for antineoplastic chemotherapy: Secondary | ICD-10-CM

## 2020-10-19 DIAGNOSIS — J301 Allergic rhinitis due to pollen: Secondary | ICD-10-CM

## 2020-10-19 DIAGNOSIS — J4520 Mild intermittent asthma, uncomplicated: Secondary | ICD-10-CM

## 2020-10-19 DIAGNOSIS — C3492 Malignant neoplasm of unspecified part of left bronchus or lung: Secondary | ICD-10-CM | POA: Diagnosis not present

## 2020-10-19 DIAGNOSIS — J309 Allergic rhinitis, unspecified: Secondary | ICD-10-CM | POA: Insufficient documentation

## 2020-10-19 DIAGNOSIS — C3412 Malignant neoplasm of upper lobe, left bronchus or lung: Secondary | ICD-10-CM | POA: Insufficient documentation

## 2020-10-19 DIAGNOSIS — K219 Gastro-esophageal reflux disease without esophagitis: Secondary | ICD-10-CM | POA: Diagnosis not present

## 2020-10-19 DIAGNOSIS — C3490 Malignant neoplasm of unspecified part of unspecified bronchus or lung: Secondary | ICD-10-CM

## 2020-10-19 HISTORY — DX: Unspecified asthma, uncomplicated: J45.909

## 2020-10-19 LAB — CMP (CANCER CENTER ONLY)
ALT: 18 U/L (ref 0–44)
AST: 20 U/L (ref 15–41)
Albumin: 3.8 g/dL (ref 3.5–5.0)
Alkaline Phosphatase: 58 U/L (ref 38–126)
Anion gap: 8 (ref 5–15)
BUN: 11 mg/dL (ref 8–23)
CO2: 26 mmol/L (ref 22–32)
Calcium: 9.1 mg/dL (ref 8.9–10.3)
Chloride: 110 mmol/L (ref 98–111)
Creatinine: 0.96 mg/dL (ref 0.44–1.00)
GFR, Estimated: >60 mL/min (ref >60)
Glucose, Bld: 131 mg/dL — ABNORMAL HIGH (ref 70–99)
Potassium: 3.8 mmol/L (ref 3.5–5.1)
Sodium: 144 mmol/L (ref 135–145)
Total Bilirubin: 0.3 mg/dL (ref 0.3–1.2)
Total Protein: 6.2 g/dL — ABNORMAL LOW (ref 6.5–8.1)

## 2020-10-19 LAB — PULMONARY FUNCTION TEST
DL/VA % pred: 92 %
DL/VA: 3.78 ml/min/mmHg/L
DLCO cor % pred: 79 %
DLCO cor: 16.3 ml/min/mmHg
DLCO unc % pred: 79 %
DLCO unc: 16.4 ml/min/mmHg
FEF 25-75 Post: 1.21 L/sec
FEF 25-75 Pre: 0.74 L/sec
FEF2575-%Change-Post: 64 %
FEF2575-%Pred-Post: 59 %
FEF2575-%Pred-Pre: 36 %
FEV1-%Change-Post: 12 %
FEV1-%Pred-Post: 69 %
FEV1-%Pred-Pre: 61 %
FEV1-Post: 1.7 L
FEV1-Pre: 1.5 L
FEV1FVC-%Change-Post: 3 %
FEV1FVC-%Pred-Pre: 85 %
FEV6-%Change-Post: 9 %
FEV6-%Pred-Post: 82 %
FEV6-%Pred-Pre: 75 %
FEV6-Post: 2.53 L
FEV6-Pre: 2.32 L
FEV6FVC-%Pred-Post: 104 %
FEV6FVC-%Pred-Pre: 104 %
FVC-%Change-Post: 9 %
FVC-%Pred-Post: 78 %
FVC-%Pred-Pre: 72 %
FVC-Post: 2.53 L
FVC-Pre: 2.32 L
Post FEV1/FVC ratio: 67 %
Post FEV6/FVC ratio: 100 %
Pre FEV1/FVC ratio: 65 %
Pre FEV6/FVC Ratio: 100 %
RV % pred: 146 %
RV: 3.29 L
TLC % pred: 111 %
TLC: 5.88 L

## 2020-10-19 LAB — CBC WITH DIFFERENTIAL (CANCER CENTER ONLY)
Abs Immature Granulocytes: 0.01 10*3/uL (ref 0.00–0.07)
Basophils Absolute: 0 10*3/uL (ref 0.0–0.1)
Basophils Relative: 0 %
Eosinophils Absolute: 0.1 10*3/uL (ref 0.0–0.5)
Eosinophils Relative: 2 %
HCT: 33 % — ABNORMAL LOW (ref 36.0–46.0)
Hemoglobin: 11.1 g/dL — ABNORMAL LOW (ref 12.0–15.0)
Immature Granulocytes: 0 %
Lymphocytes Relative: 23 %
Lymphs Abs: 0.9 10*3/uL (ref 0.7–4.0)
MCH: 32.8 pg (ref 26.0–34.0)
MCHC: 33.6 g/dL (ref 30.0–36.0)
MCV: 97.6 fL (ref 80.0–100.0)
Monocytes Absolute: 0.4 10*3/uL (ref 0.1–1.0)
Monocytes Relative: 9 %
Neutro Abs: 2.6 10*3/uL (ref 1.7–7.7)
Neutrophils Relative %: 66 %
Platelet Count: 92 10*3/uL — ABNORMAL LOW (ref 150–400)
RBC: 3.38 MIL/uL — ABNORMAL LOW (ref 3.87–5.11)
RDW: 12.8 % (ref 11.5–15.5)
WBC Count: 4.1 10*3/uL (ref 4.0–10.5)
nRBC: 0 % (ref 0.0–0.2)

## 2020-10-19 NOTE — Progress Notes (Signed)
PFT done today. 

## 2020-10-19 NOTE — Assessment & Plan Note (Signed)
Moderate obstruction with a positive bronchodilator response identified on her pulmonary function testing today.  She is off Stiolto, unclear that she got true benefit.  Minimal symptoms so we will not start scheduled bronchodilator therapy right now.  She has albuterol and will use it as needed.  For use goes up or if her symptoms increase and we could consider ICS or ICS/LABA.

## 2020-10-19 NOTE — Assessment & Plan Note (Signed)
Remains on Tagrisso.  There was some concern about possible associated pneumonitis but this cleared and I believe it likely was either due to radiation or another inflammatory process.  She is tolerating, planning through November 2022 with repeat imaging to be planned by Dr. Julien Nordmann.

## 2020-10-19 NOTE — Assessment & Plan Note (Signed)
Likely the most active contributor to her cough.  The cough is better.  She is interested in trying to come off the PPI if possible.  I asked her to continue scheduled Pepcid, decrease the frequency of her Aciphex.  She knows that if her cough returns she would likely need to go back on the Aciphex every day.

## 2020-10-19 NOTE — Patient Instructions (Addendum)
We will hold off on starting an every day inhaler for now. Keep albuterol available to use 2 puffs if needed for shortness of breath, chest tightness, wheezing. Continue Pepcid each evening as you have been taking it. Okay to try cutting back on your Aciphex.  If your cough returns when you make this change and you may need to go back on it every day. Continue to follow with Dr. Julien Nordmann and get your repeat chest imaging as per his plans. Follow with Dr Lamonte Sakai in 6 months or sooner if you have any problems

## 2020-10-19 NOTE — Progress Notes (Signed)
   Subjective:    Patient ID: Linda Woods, female    DOB: [birth year], 70 y.o.   MRN: [id number]  HPI  ROV 10/19/20 --70 year old never smoker with a history of stage IIIc adenocarcinoma postchemotherapy, radiation and Tagrisso.  There were some changes on her CT concerning for possible Tagrisso associated pneumonitis, cleared on most recent scan 07/2020.  Tagrisso was continued, planning thru Nov.  I saw her for refractory cough about 6 weeks ago.  I stopped Stiolto given some question about degree of obstructive disease, perform pulmonary function test today.  I also increased her Aciphex to qd and asked her to take her Pepcid each evening.  Continue Allegra.  Today she reports  Pulmonary function testing performed today and reviewed by me, showed moderately severe obstruction with a positive bronchodilator response, hyperinflated lung volumes and a decreased diffusion capacity that does correct for alveolar volume.   Review of Systems As per HPI     Objective:   Physical Exam Vitals:   10/19/20 1106  BP: 118/76  Pulse: 97  Temp: 97.7 F (36.5 C)  TempSrc: Oral  SpO2: 98%  Weight: 166 lb 3.2 oz (75.4 kg)  Height: 5' 5.5" (1.664 m)   Gen: Pleasant, well-nourished, in no distress,  normal affect  ENT: No lesions,  mouth clear,  oropharynx clear, no postnasal drip.   Neck: No JVD, strong voice.   Lungs: No use of accessory muscles, no crackles or wheezing on normal respiration, no wheeze on forced expiration.  No UA noise  Cardiovascular: RRR, heart sounds normal, no murmur or gallops, no peripheral edema  Musculoskeletal: No deformities, no cyanosis or clubbing.    Neuro: alert, awake, non focal  Skin: Warm, no lesions or rash     Assessment & Plan:  Adenocarcinoma of left lung, stage 3 ([place]) Remains on Tagrisso.  There was some concern about possible associated pneumonitis but this cleared and I believe it likely was either due to radiation or another inflammatory  process.  She is tolerating, planning through November 2022 with repeat imaging to be planned by Dr. Julien Nordmann.  Gastroesophageal reflux disease without esophagitis Likely the most active contributor to her cough.  The cough is better.  She is interested in trying to come off the PPI if possible.  I asked her to continue scheduled Pepcid, decrease the frequency of her Aciphex.  She knows that if her cough returns she would likely need to go back on the Aciphex every day.  Cough Significant improvement after more aggressive treatment of her GERD.  Plan to continue to treat rhinitis and GERD, modify her PPI as above because she is worried about potential side effects.  She understands that she may need to go back on it every day if cough becomes more active.  Allergic rhinitis Allegra and Flonase  Asthma Moderate obstruction with a positive bronchodilator response identified on her pulmonary function testing today.  She is off Stiolto, unclear that she got true benefit.  Minimal symptoms so we will not start scheduled bronchodilator therapy right now.  She has albuterol and will use it as needed.  For use goes up or if her symptoms increase and we could consider ICS or ICS/LABA.    Baltazar Apo, MD, PhD 10/19/2020, 11:30 AM [facility] [phone] or if no answer [phone]

## 2020-10-19 NOTE — Assessment & Plan Note (Addendum)
Significant improvement after more aggressive treatment of her GERD.  Plan to continue to treat rhinitis and GERD, modify her PPI as above because she is worried about potential side effects.  She understands that she may need to go back on it every day if cough becomes more active.

## 2020-10-19 NOTE — Progress Notes (Signed)
    [facility] [phone]   [phone]  OFFICE PROGRESS NOTE  Knowlton, Steve, MD [address] [place] [address]  DIAGNOSIS: Stage IIIc (T3, N3, M0) non-small cell lung cancer, adenocarcinoma presented with large left upper lobe lung mass in addition to left infrahilar and right hilar and subcarinal lymphadenopathy diagnosed in August 2021.   Molecular Biomarkers: Insufficient tissue for foundation 1. Guardant 360 results.  DETECTED ALTERATION(S) / BIOMARKER(S) % CFDNA OR AMPLIFICATION ASSOCIATED FDA-APPROVED THERAPIES CLINICAL TRIAL AVAILABILITY  EGFR L858R, 1.2%, Afatinib, Dacomitinib, Erlotinib, Gefitinib, Osimertinib, Ramucirumab Yes [id number], 1.0%, Afatinib, Dacomitinib, Erlotinib, Gefitinib, Osimertinib, Ramucirumab Yes [id number] 1.0% None  No   PRIOR THERAPY: Weekly concurrent chemoradiation with carboplatin for an AUC of 2 and paclitaxel 45 mg/m2.  First dose expected on 11/16/2019. Status post 7 cycles.   CURRENT THERAPY: Tagrisso 80 mg p.o. daily. First dose started 01/31/2020.  Status post 8 months of treatment.  INTERVAL HISTORY: Linda Woods 69 y.o. female returns to the clinic today for follow-up visit.  The patient is feeling fine today with no concerning complaints.  She continues to tolerate her treatment with Tagrisso fairly well.  She has less with after starting treatment for GERD.  She denied having any current chest pain, shortness of breath, cough or hemoptysis.  She denied having any fever or chills.  She has no nausea, vomiting, diarrhea or constipation.  She has no headache or visual changes.  She is here today for evaluation and repeat blood work. MEDICAL HISTORY: Past Medical History:  Diagnosis Date   Anemia    as a child   Anxiety    Asthma 10/19/2020   Bursitis    Chronic reflux esophagitis    Complication of anesthesia    Diarrhea, functional    Diverticulosis    GERD (gastroesophageal reflux  disease)    History of kidney stones    Hypertension    Knee pain    right knee-seeing ortho   lung ca 09/2019   Osteoarthritis    Plantar fasciitis    PONV (postoperative nausea and vomiting)    has used the patch before and that helps   Pure hypercholesterolemia    RLS (restless legs syndrome)    Superficial vein thrombosis    Thyroid disease    hypothyroid    ALLERGIES:  is allergic to crestor [rosuvastatin calcium].  MEDICATIONS:  Current Outpatient Medications  Medication Sig Dispense Refill   albuterol (VENTOLIN HFA) 108 (90 Base) MCG/ACT inhaler INHALE (2) PUFFS INTO THE LUNGS EVERY 6 HOURS AS NEEDED FOR WHEEZING OR SHORTNESS OF BREATH 8.5 g 0   cholecalciferol (VITAMIN D3) 25 MCG (1000 UNIT) tablet Take 1,000 Units by mouth daily. Pt states she take 2000 units/day     famotidine (PEPCID) 40 MG tablet Take 40 mg by mouth See admin instructions. 4 times a week     ferrous sulfate 325 (65 FE) MG tablet Take 325 mg by mouth daily with breakfast.     fexofenadine (ALLEGRA) 180 MG tablet Take 180 mg by mouth daily.     fluticasone (FLONASE) 50 MCG/ACT nasal spray Place 2 sprays into both nostrils daily.     HYDROcodone-acetaminophen (NORCO/VICODIN) 5-325 MG tablet Take 0.5 tablets by mouth daily as needed (Knee pain).     HYDROcodone-homatropine (HYCODAN) 5-1.5 MG/5ML syrup Take 5 mLs by mouth daily as needed for cough.     ibuprofen (ADVIL) 200 MG tablet Take 400 mg by mouth every 6 (  six) hours as needed (Knee pain).     lidocaine (LIDODERM) 5 % 1 patch daily.     LORazepam (ATIVAN) 0.5 MG tablet Take 0.5 mg by mouth at bedtime.      magic mouthwash SOLN 5 to 10 ml swish and spit or swallow QID prn mouth and esophageal pain 240 mL 2   melatonin 5 MG TABS Take 5 mg by mouth at bedtime.     metoprolol succinate (TOPROL XL) 50 MG 24 hr tablet Take 50 mg by mouth daily.      MYRBETRIQ 25 MG TB24 tablet Take 25 mg by mouth daily.     osimertinib mesylate (TAGRISSO) 80 MG tablet  TAKE 1 TABLET BY MOUTH DAILY. 30 tablet 2   oxyCODONE-acetaminophen (PERCOCET/ROXICET) 5-325 MG tablet Take 1 tablet by mouth every 4 (four) hours as needed for severe pain. 45 tablet 0   oxymetazoline (AFRIN) 0.05 % nasal spray Place 1 spray into both nostrils daily as needed for congestion.     phenazopyridine (PYRIDIUM) 200 MG tablet Take 200 mg by mouth daily as needed.     prochlorperazine (COMPAZINE) 10 MG tablet Take 1 tablet (10 mg total) by mouth every 6 (six) hours as needed for nausea or vomiting. 30 tablet 0   RABEprazole (ACIPHEX) 20 MG tablet Take 1 tablet (20 mg total) by mouth 2 (two) times a week.     rosuvastatin (CRESTOR) 5 MG tablet Take 5 mg by mouth daily.     SYNTHROID 112 MCG tablet Take 112 mcg by mouth daily before breakfast.      vitamin B-12 (CYANOCOBALAMIN) 1000 MCG tablet Take 1,000 mcg by mouth daily.     No current facility-administered medications for this visit.    SURGICAL HISTORY:  Past Surgical History:  Procedure Laterality Date   APPENDECTOMY     BRONCHIAL BIOPSY  10/20/2019   Procedure: BRONCHIAL BIOPSIES;  Surgeon: Byrum, Robert S, MD;  Location: [facility] END[facility];  Service: Pulmonary;;   BRONCHIAL BRUSHINGS  10/20/2019   Procedure: BRONCHIAL BRUSHINGS;  Surgeon: Byrum, Robert S, MD;  Location: [facility];  Service: Pulmonary;;   BRONCHIAL NEEDLE ASPIRATION BIOPSY  10/20/2019   Procedure: BRONCHIAL NEEDLE ASPIRATION BIOPSIES;  Surgeon: Byrum, Robert S, MD;  Location: [facility]OS[facility];  Service: Pulmonary;;   BRONCHIAL WASHINGS  10/20/2019   Procedure: BRONCHIAL WASHINGS;  Surgeon: Byrum, Robert S, MD;  Location: [facility]OS[facility];  Service: Pulmonary;;   CARPAL TUNNEL RELEASE Right 2011   Dr. Gramig   COLPORRHAPHY     posterior   HAND SURGERY Right 2016   Nerve surgery, Dr. Gramig   OVARIAN CYST REMOVAL     RECTOCELE REPAIR  2011   w/TVH and sling   TONSILLECTOMY     TONSILLECTOMY     TOTAL KNEE ARTHROPLASTY Left 09/09/2018   Procedure: TOTAL KNEE  ARTHROPLASTY, CORTISONE INJECTION RIGHT KNEE;  Surgeon: Olin, Matthew, MD;  Location: [facility];  Service: Orthopedics;  Laterality: Left;  70 mins   TOTAL VAGINAL HYSTERECTOMY  10/18/2009   rectocele repair, sling   TUBAL LIGATION Bilateral    VARICOSE VEIN SURGERY     VIDEO BRONCHOSCOPY WITH ENDOBRONCHIAL NAVIGATION N/A 10/20/2019   Procedure: VIDEO BRONCHOSCOPY WITH ENDOBRONCHIAL NAVIGATION;  Surgeon: Byrum, Robert S, MD;  Location: [facility];  Service: Pulmonary;  Laterality: N/A;    REVIEW OF SYSTEMS:  A comprehensive review of systems was negative except for: Constitutional: positive for fatigue Gastrointestinal: positive for diarrhea   PHYSICAL EXAMINATION: General appearance: alert, cooperative, fatigued,   and no distress Head: Normocephalic, without obvious abnormality, atraumatic Neck: no adenopathy, no JVD, supple, symmetrical, trachea midline, and thyroid not enlarged, symmetric, no tenderness/mass/nodules Lymph nodes: Cervical, supraclavicular, and axillary nodes normal. Resp: clear to auscultation bilaterally Back: symmetric, no curvature. ROM normal. No CVA tenderness. Cardio: regular rate and rhythm, S1, S2 normal, no murmur, click, rub or gallop GI: soft, non-tender; bowel sounds normal; no masses,  no organomegaly Extremities: extremities normal, atraumatic, no cyanosis or edema  ECOG PERFORMANCE STATUS: 1 - Symptomatic but completely ambulatory  Blood pressure 119/81, pulse 93, temperature 97.7 F (36.5 C), temperature source Tympanic, resp. rate 19, height 5' 5.5" (1.664 m), weight 167 lb 1.6 oz (75.8 kg), last menstrual period 08/21/2002, SpO2 97 %.  LABORATORY DATA: Lab Results  Component Value Date   WBC 4.1 10/19/2020   HGB 11.1 (L) 10/19/2020   HCT 33.0 (L) 10/19/2020   MCV 97.6 10/19/2020   PLT 92 (L) 10/19/2020      Chemistry      Component Value Date/Time   NA 144 10/19/2020 1426   K 3.8 10/19/2020 1426   CL 110 10/19/2020 1426   CO2 26 10/19/2020  1426   BUN 11 10/19/2020 1426   CREATININE 0.96 10/19/2020 1426      Component Value Date/Time   CALCIUM 9.1 10/19/2020 1426   ALKPHOS 58 10/19/2020 1426   AST 20 10/19/2020 1426   ALT 18 10/19/2020 1426   BILITOT 0.3 10/19/2020 1426       RADIOGRAPHIC STUDIES: No results found.  ASSESSMENT AND PLAN: This is a very pleasant 70 years old white female recently diagnosed with a stage IIIc/IV (T3, N3, M0/M1a) non-small cell lung cancer, adenocarcinoma presented with large left upper lobe lung mass in addition to left infrahilar, right hilar and subcarinal lymphadenopathy and small left pleural effusion diagnosed in August 2021 with positive EGFR mutation in exon 21 (L858R). The patient completed a course of concurrent chemoradiation with weekly carboplatin and paclitaxel status post 7 cycles.  She tolerated her treatment well except for fatigue as well as a skin burn and cough. Her scan showed mild improvement of the left upper lobe lung mass as well as the mediastinal lymphadenopathy.  The patient continues to have small left pleural effusion that is suspicious given her presentation. She is currently on treatment with Tagrisso 80 mg p.o. daily started on January 31, 2020.  Status post 8 months of treatment. The patient continues to tolerate her treatment with Tagrisso fairly well. I recommended for her to continue her current treatment with Tagrisso with the same dose. I will see her back for follow-up visit in 1 months for evaluation and repeat CT scan of the chest for restaging of her disease. For the wheezing and pulmonary radiation fibrosis, she is currently followed by Dr. Lamonte Sakai and treated for GERD.  She had improvement of her wheezing after starting treatment for the GERD. For the diarrhea, she will continue on Imodium on as-needed basis. The patient was advised to call immediately if she has any other concerning symptoms in the interval. The patient voices understanding of current  disease status and treatment options and is in agreement with the current care plan.  All questions were answered. The patient knows to call the clinic with any problems, questions or concerns. We can certainly see the patient much sooner if necessary.  Disclaimer: This note was dictated with voice recognition software. Similar sounding words can inadvertently be transcribed and may not be corrected upon review.

## 2020-10-19 NOTE — Assessment & Plan Note (Signed)
Allegra and Flonase

## 2020-10-20 ENCOUNTER — Other Ambulatory Visit (HOSPITAL_COMMUNITY): Payer: Self-pay

## 2020-10-25 ENCOUNTER — Other Ambulatory Visit (HOSPITAL_COMMUNITY): Payer: Self-pay

## 2020-11-17 ENCOUNTER — Other Ambulatory Visit (HOSPITAL_COMMUNITY): Payer: Self-pay

## 2020-11-18 ENCOUNTER — Other Ambulatory Visit: Payer: Self-pay

## 2020-11-18 ENCOUNTER — Ambulatory Visit (HOSPITAL_COMMUNITY)
Admission: RE | Admit: 2020-11-18 | Discharge: 2020-11-18 | Disposition: A | Discharge status: Home / Self Care | Payer: Medicare Other | Source: Ambulatory Visit | Attending: Internal Medicine | Admitting: Internal Medicine

## 2020-11-18 DIAGNOSIS — C3490 Malignant neoplasm of unspecified part of unspecified bronchus or lung: Secondary | ICD-10-CM | POA: Diagnosis not present

## 2020-11-18 MED ORDER — IOHEXOL 350 MG/ML SOLN
75.0000 mL | Freq: Once | INTRAVENOUS | Status: AC | PRN
  Administered 2020-11-18: 75 mL via INTRAVENOUS

## 2020-11-21 ENCOUNTER — Inpatient Hospital Stay: Payer: Medicare Other | Attending: Internal Medicine | Admitting: Internal Medicine

## 2020-11-21 ENCOUNTER — Other Ambulatory Visit: Payer: Self-pay

## 2020-11-21 ENCOUNTER — Inpatient Hospital Stay: Payer: Medicare Other

## 2020-11-21 ENCOUNTER — Other Ambulatory Visit (HOSPITAL_COMMUNITY): Payer: Self-pay

## 2020-11-21 VITALS — BP 116/67 | HR 86 | Temp 97.4°F | Resp 18 | Ht 65.5 in | Wt 164.9 lb

## 2020-11-21 DIAGNOSIS — J90 Pleural effusion, not elsewhere classified: Secondary | ICD-10-CM | POA: Diagnosis not present

## 2020-11-21 DIAGNOSIS — Z5111 Encounter for antineoplastic chemotherapy: Secondary | ICD-10-CM

## 2020-11-21 DIAGNOSIS — R590 Localized enlarged lymph nodes: Secondary | ICD-10-CM | POA: Insufficient documentation

## 2020-11-21 DIAGNOSIS — K219 Gastro-esophageal reflux disease without esophagitis: Secondary | ICD-10-CM | POA: Diagnosis not present

## 2020-11-21 DIAGNOSIS — C3412 Malignant neoplasm of upper lobe, left bronchus or lung: Secondary | ICD-10-CM | POA: Insufficient documentation

## 2020-11-21 DIAGNOSIS — J8410 Pulmonary fibrosis, unspecified: Secondary | ICD-10-CM | POA: Diagnosis not present

## 2020-11-21 DIAGNOSIS — C3492 Malignant neoplasm of unspecified part of left bronchus or lung: Secondary | ICD-10-CM

## 2020-11-21 DIAGNOSIS — C3490 Malignant neoplasm of unspecified part of unspecified bronchus or lung: Secondary | ICD-10-CM

## 2020-11-21 DIAGNOSIS — R197 Diarrhea, unspecified: Secondary | ICD-10-CM | POA: Diagnosis not present

## 2020-11-21 LAB — CBC WITH DIFFERENTIAL (CANCER CENTER ONLY)
Abs Immature Granulocytes: 0.01 10*3/uL (ref 0.00–0.07)
Basophils Absolute: 0 10*3/uL (ref 0.0–0.1)
Basophils Relative: 0 %
Eosinophils Absolute: 0.1 10*3/uL (ref 0.0–0.5)
Eosinophils Relative: 3 %
HCT: 36 % (ref 36.0–46.0)
Hemoglobin: 12.3 g/dL (ref 12.0–15.0)
Immature Granulocytes: 0 %
Lymphocytes Relative: 21 %
Lymphs Abs: 0.9 10*3/uL (ref 0.7–4.0)
MCH: 32.5 pg (ref 26.0–34.0)
MCHC: 34.2 g/dL (ref 30.0–36.0)
MCV: 95.2 fL (ref 80.0–100.0)
Monocytes Absolute: 0.5 10*3/uL (ref 0.1–1.0)
Monocytes Relative: 11 %
Neutro Abs: 2.7 10*3/uL (ref 1.7–7.7)
Neutrophils Relative %: 65 %
Platelet Count: 99 10*3/uL — ABNORMAL LOW (ref 150–400)
RBC: 3.78 MIL/uL — ABNORMAL LOW (ref 3.87–5.11)
RDW: 12.6 % (ref 11.5–15.5)
WBC Count: 4.3 10*3/uL (ref 4.0–10.5)
nRBC: 0 % (ref 0.0–0.2)

## 2020-11-21 LAB — CMP (CANCER CENTER ONLY)
ALT: 19 U/L (ref 0–44)
AST: 18 U/L (ref 15–41)
Albumin: 3.8 g/dL (ref 3.5–5.0)
Alkaline Phosphatase: 71 U/L (ref 38–126)
Anion gap: 10 (ref 5–15)
BUN: 13 mg/dL (ref 8–23)
CO2: 25 mmol/L (ref 22–32)
Calcium: 9.3 mg/dL (ref 8.9–10.3)
Chloride: 108 mmol/L (ref 98–111)
Creatinine: 0.88 mg/dL (ref 0.44–1.00)
GFR, Estimated: >60 mL/min (ref >60)
Glucose, Bld: 95 mg/dL (ref 70–99)
Potassium: 4.4 mmol/L (ref 3.5–5.1)
Sodium: 143 mmol/L (ref 135–145)
Total Bilirubin: 0.3 mg/dL (ref 0.3–1.2)
Total Protein: 6.5 g/dL (ref 6.5–8.1)

## 2020-11-21 NOTE — Progress Notes (Signed)
[facility] [phone]   [phone]  OFFICE PROGRESS NOTE  [name], MD [place] [address]  DIAGNOSIS: Stage IIIc (T3, N3, M0) non-small cell lung cancer, adenocarcinoma presented with large left upper lobe lung mass in addition to left infrahilar and right hilar and subcarinal lymphadenopathy diagnosed in August 2021.   Molecular Biomarkers: Insufficient tissue for foundation 1. Guardant 360 results.  DETECTED ALTERATION(S) / BIOMARKER(S) % CFDNA OR AMPLIFICATION ASSOCIATED FDA-APPROVED THERAPIES CLINICAL TRIAL AVAILABILITY  EGFR L858R, 1.2%, Afatinib, Dacomitinib, Erlotinib, Gefitinib, Osimertinib, Ramucirumab Yes [id number], 1.0%, Afatinib, Dacomitinib, Erlotinib, Gefitinib, Osimertinib, Ramucirumab Yes [id number] 1.0% None  No   PRIOR THERAPY: Weekly concurrent chemoradiation with carboplatin for an AUC of 2 and paclitaxel 45 mg/m2.  First dose expected on 11/16/2019. Status post 7 cycles.   CURRENT THERAPY: Tagrisso 80 mg p.o. daily. First dose started 01/31/2020.  Status post 10 months of treatment.  INTERVAL HISTORY: Linda Woods 70 y.o. female returns to the clinic today for follow-up visit.  The patient is feeling fine today with no concerning complaints except for the baseline shortness of breath as well as few episodes of diarrhea.  She denied having any current chest pain, or hemoptysis but has dry cough.  She has no nausea, vomiting, abdominal pain or constipation.  She has no headache or visual changes.  She denied having any recent weight loss or night sweats.  She has no fever or chills.  She continues to tolerate her treatment with Tagrisso fairly well.  The patient had repeat CT scan of the chest performed recently and she is here for evaluation and discussion of her risk her results.  MEDICAL HISTORY: Past Medical History:  Diagnosis Date   Anemia    as a child   Anxiety    Asthma 10/19/2020    Bursitis    Chronic reflux esophagitis    Complication of anesthesia    Diarrhea, functional    Diverticulosis    GERD (gastroesophageal reflux disease)    History of kidney stones    Hypertension    Knee pain    right knee-seeing ortho   lung ca 09/2019   Osteoarthritis    Plantar fasciitis    PONV (postoperative nausea and vomiting)    has used the patch before and that helps   Pure hypercholesterolemia    RLS (restless legs syndrome)    Superficial vein thrombosis    Thyroid disease    hypothyroid    ALLERGIES:  has no active allergies.  MEDICATIONS:  Current Outpatient Medications  Medication Sig Dispense Refill   albuterol (VENTOLIN HFA) 108 (90 Base) MCG/ACT inhaler INHALE (2) PUFFS INTO THE LUNGS EVERY 6 HOURS AS NEEDED FOR WHEEZING OR SHORTNESS OF BREATH 8.5 g 0   cholecalciferol (VITAMIN D3) 25 MCG (1000 UNIT) tablet Take 1,000 Units by mouth daily. Pt states she take 2000 units/day     famotidine (PEPCID) 40 MG tablet Take 40 mg by mouth See admin instructions. 4 times a week     ferrous sulfate 325 (65 FE) MG tablet Take 325 mg by mouth daily with breakfast.     fexofenadine (ALLEGRA) 180 MG tablet Take 180 mg by mouth daily.     fluticasone (FLONASE) 50 MCG/ACT nasal spray Place 2 sprays into both nostrils daily.     HYDROcodone-acetaminophen (NORCO/VICODIN) 5-325 MG tablet Take 0.5 tablets by mouth daily as needed (Knee pain).     HYDROcodone-homatropine (HYCODAN) 5-1.5  MG/5ML syrup Take 5 mLs by mouth daily as needed for cough.     ibuprofen (ADVIL) 200 MG tablet Take 400 mg by mouth every 6 (six) hours as needed (Knee pain).     lidocaine (LIDODERM) 5 % 1 patch daily.     LORazepam (ATIVAN) 0.5 MG tablet Take 0.5 mg by mouth at bedtime.      magic mouthwash SOLN 5 to 10 ml swish and spit or swallow QID prn mouth and esophageal pain 240 mL 2   melatonin 5 MG TABS Take 5 mg by mouth at bedtime.     metoprolol succinate (TOPROL XL) 50 MG 24 hr tablet Take 50 mg  by mouth daily.      MYRBETRIQ 25 MG TB24 tablet Take 25 mg by mouth daily.     osimertinib mesylate (TAGRISSO) 80 MG tablet TAKE 1 TABLET BY MOUTH DAILY. 30 tablet 2   oxyCODONE-acetaminophen (PERCOCET/ROXICET) 5-325 MG tablet Take 1 tablet by mouth every 4 (four) hours as needed for severe pain. 45 tablet 0   oxymetazoline (AFRIN) 0.05 % nasal spray Place 1 spray into both nostrils daily as needed for congestion.     phenazopyridine (PYRIDIUM) 200 MG tablet Take 200 mg by mouth daily as needed.     prochlorperazine (COMPAZINE) 10 MG tablet Take 1 tablet (10 mg total) by mouth every 6 (six) hours as needed for nausea or vomiting. 30 tablet 0   RABEprazole (ACIPHEX) 20 MG tablet Take 1 tablet (20 mg total) by mouth 2 (two) times a week.     rosuvastatin (CRESTOR) 5 MG tablet Take 5 mg by mouth daily.     SYNTHROID 112 MCG tablet Take 112 mcg by mouth daily before breakfast.      vitamin B-12 (CYANOCOBALAMIN) 1000 MCG tablet Take 1,000 mcg by mouth daily.     No current facility-administered medications for this visit.    SURGICAL HISTORY:  Past Surgical History:  Procedure Laterality Date   APPENDECTOMY     BRONCHIAL BIOPSY  10/20/2019   Procedure: BRONCHIAL BIOPSIES;  Surgeon: Collene Gobble, MD;  Location: [facility] ENDOSCOPY;  Service: Pulmonary;;   BRONCHIAL BRUSHINGS  10/20/2019   Procedure: BRONCHIAL BRUSHINGS;  Surgeon: Collene Gobble, MD;  Location: [facility] ENDOSCOPY;  Service: Pulmonary;;   BRONCHIAL NEEDLE ASPIRATION BIOPSY  10/20/2019   Procedure: BRONCHIAL NEEDLE ASPIRATION BIOPSIES;  Surgeon: Collene Gobble, MD;  Location: [facility] ENDOSCOPY;  Service: Pulmonary;;   BRONCHIAL WASHINGS  10/20/2019   Procedure: BRONCHIAL WASHINGS;  Surgeon: Collene Gobble, MD;  Location: [facility] ENDOSCOPY;  Service: Pulmonary;;   CARPAL TUNNEL RELEASE Right 2011   Dr. Amedeo Plenty   COLPORRHAPHY     posterior   HAND SURGERY Right 2016   Nerve surgery, Dr. Amedeo Plenty   OVARIAN CYST REMOVAL     RECTOCELE REPAIR  2011    w/TVH and sling   TONSILLECTOMY     TONSILLECTOMY     TOTAL KNEE ARTHROPLASTY Left 09/09/2018   Procedure: TOTAL KNEE ARTHROPLASTY, CORTISONE INJECTION RIGHT KNEE;  Surgeon: Paralee Cancel, MD;  Location: [facility];  Service: Orthopedics;  Laterality: Left;  70 mins   TOTAL VAGINAL HYSTERECTOMY  10/18/2009   rectocele repair, sling   TUBAL LIGATION Bilateral    VARICOSE VEIN SURGERY     VIDEO BRONCHOSCOPY WITH ENDOBRONCHIAL NAVIGATION N/A 10/20/2019   Procedure: VIDEO BRONCHOSCOPY WITH ENDOBRONCHIAL NAVIGATION;  Surgeon: Collene Gobble, MD;  Location: [facility] ENDOSCOPY;  Service: Pulmonary;  Laterality: N/A;    REVIEW OF  SYSTEMS:  Constitutional: positive for fatigue Eyes: negative Ears, nose, mouth, throat, and face: negative Respiratory: positive for dyspnea on exertion Cardiovascular: negative Gastrointestinal: positive for diarrhea Genitourinary:negative Integument/breast: negative Hematologic/lymphatic: negative Musculoskeletal:negative Neurological: negative Behavioral/Psych: negative Endocrine: negative Allergic/Immunologic: negative   PHYSICAL EXAMINATION: General appearance: alert, cooperative, fatigued, and no distress Head: Normocephalic, without obvious abnormality, atraumatic Neck: no adenopathy, no JVD, supple, symmetrical, trachea midline, and thyroid not enlarged, symmetric, no tenderness/mass/nodules Lymph nodes: Cervical, supraclavicular, and axillary nodes normal. Resp: clear to auscultation bilaterally Back: symmetric, no curvature. ROM normal. No CVA tenderness. Cardio: regular rate and rhythm, S1, S2 normal, no murmur, click, rub or gallop GI: soft, non-tender; bowel sounds normal; no masses,  no organomegaly Extremities: extremities normal, atraumatic, no cyanosis or edema Neurologic: Alert and oriented X 3, normal strength and tone. Normal symmetric reflexes. Normal coordination and gait  ECOG PERFORMANCE STATUS: 1 - Symptomatic but completely ambulatory  Blood  pressure 116/67, pulse 86, temperature (!) 97.4 F (36.3 C), temperature source Tympanic, resp. rate 18, height 5' 5.5" (1.664 m), weight 164 lb 14.4 oz (74.8 kg), last menstrual period 08/21/2002, SpO2 96 %.  LABORATORY DATA: Lab Results  Component Value Date   WBC 4.1 10/19/2020   HGB 11.1 (L) 10/19/2020   HCT 33.0 (L) 10/19/2020   MCV 97.6 10/19/2020   PLT 92 (L) 10/19/2020      Chemistry      Component Value Date/Time   NA 144 10/19/2020 1426   K 3.8 10/19/2020 1426   CL 110 10/19/2020 1426   CO2 26 10/19/2020 1426   BUN 11 10/19/2020 1426   CREATININE 0.96 10/19/2020 1426      Component Value Date/Time   CALCIUM 9.1 10/19/2020 1426   ALKPHOS 58 10/19/2020 1426   AST 20 10/19/2020 1426   ALT 18 10/19/2020 1426   BILITOT 0.3 10/19/2020 1426       RADIOGRAPHIC STUDIES: CT Chest W Contrast  Result Date: 11/21/2020 CLINICAL DATA:  Restaging non-small cell lung cancer. EXAM: CT CHEST WITH CONTRAST TECHNIQUE: Multidetector CT imaging of the chest was performed during intravenous contrast administration. CONTRAST:  37m OMNIPAQUE IOHEXOL 350 MG/ML SOLN COMPARISON:  08/08/2020 FINDINGS: Cardiovascular: Normal heart size. Aortic atherosclerosis. Coronary artery calcifications Mediastinum/Nodes: No enlarged mediastinal, hilar, or axillary lymph nodes. Thyroid gland, trachea, and esophagus demonstrate no significant findings. Lungs/Pleura: Perihilar fibrosis and architectural distortion within the left mid lung is stable from the previous exam. Similarly, there is perihilar fibrosis and architectural distortion within the right upper lobe and right lower lobe. No pleural effusion, acute airspace consolidation, atelectasis or pneumothorax. Tiny cluster of peripheral tree-in-bud nodules within the posterolateral right upper lobe are unchanged, image 35/5. Most likely postinflammatory. No new suspicious lung nodules identified. Upper Abdomen: No acute abnormality. Small hiatal hernia. Right  kidney cyst. Stable appearance of left adrenal gland nodule measuring 1 cm compatible with a benign adenoma. Musculoskeletal: Unchanged appearance small sclerotic foci within the T10 and T11 vertebral bodies. No acute abnormality. IMPRESSION: 1. Stable CT of the chest. No specific findings identified to suggest residual or recurrent tumor or metastatic disease. 2. Small hiatal hernia. 3. Stable left adrenal gland adenoma. 4. Stable small sclerotic foci within the T10 and T11 vertebral bodies. 5. Aortic Atherosclerosis (ICD10-I70.0). Electronically Signed   By: TKerby MoorsM.D.   On: 11/21/2020 08:52    ASSESSMENT AND PLAN: This is a very pleasant 70years old white female recently diagnosed with a stage IIIc/IV (T3, N3, M0/M1a) non-small cell lung  cancer, adenocarcinoma presented with large left upper lobe lung mass in addition to left infrahilar, right hilar and subcarinal lymphadenopathy and small left pleural effusion diagnosed in August 2021 with positive EGFR mutation in exon 21 (L858R). The patient completed a course of concurrent chemoradiation with weekly carboplatin and paclitaxel status post 7 cycles.  She tolerated her treatment well except for fatigue as well as a skin burn and cough. Her scan showed mild improvement of the left upper lobe lung mass as well as the mediastinal lymphadenopathy.  The patient continues to have small left pleural effusion that is suspicious given her presentation. She is currently on treatment with Tagrisso 80 mg p.o. daily started on January 31, 2020.  Status post 10 months of treatment. The patient continues to tolerate her treatment with Tagrisso fairly well with no significant adverse effects. She had repeat CT scan of the chest performed recently.  I personally and independently reviewed the scan images and discussed the result and showed the images to the patient today. Her scan showed no concerning findings for disease recurrence or metastasis. I  recommended for the patient to continue her current treatment with Tagrisso with the same dose. I will see her back for follow-up visit in 6 weeks for evaluation and repeat blood work. For the history of pulmonary fibrosis and wheezing, she is followed by Dr. Lamonte Sakai and currently on treatment with GERD with improvement in her symptoms. For the diarrhea I recommended for the patient to take 1 or 2 tablets of Imodium every morning and then on as-needed basis for persistent diarrhea. She was advised to call immediately if she has any other concerning symptoms in the interval.  The patient voices understanding of current disease status and treatment options and is in agreement with the current care plan.  All questions were answered. The patient knows to call the clinic with any problems, questions or concerns. We can certainly see the patient much sooner if necessary.  Disclaimer: This note was dictated with voice recognition software. Similar sounding words can inadvertently be transcribed and may not be corrected upon review.

## 2020-12-08 ENCOUNTER — Other Ambulatory Visit (HOSPITAL_COMMUNITY): Payer: Self-pay

## 2020-12-12 ENCOUNTER — Other Ambulatory Visit (HOSPITAL_COMMUNITY): Payer: Self-pay

## 2020-12-13 ENCOUNTER — Encounter: Payer: Self-pay | Admitting: Internal Medicine

## 2020-12-14 ENCOUNTER — Other Ambulatory Visit: Payer: Self-pay | Admitting: Internal Medicine

## 2020-12-14 DIAGNOSIS — C3492 Malignant neoplasm of unspecified part of left bronchus or lung: Secondary | ICD-10-CM

## 2020-12-20 ENCOUNTER — Other Ambulatory Visit (HOSPITAL_COMMUNITY): Payer: Self-pay

## 2021-01-09 ENCOUNTER — Other Ambulatory Visit (HOSPITAL_COMMUNITY): Payer: Self-pay

## 2021-01-09 ENCOUNTER — Other Ambulatory Visit: Payer: Self-pay

## 2021-01-09 ENCOUNTER — Inpatient Hospital Stay: Payer: Medicare Other

## 2021-01-09 ENCOUNTER — Inpatient Hospital Stay: Payer: Medicare Other | Attending: Internal Medicine | Admitting: Internal Medicine

## 2021-01-09 ENCOUNTER — Encounter: Payer: Self-pay | Admitting: Internal Medicine

## 2021-01-09 VITALS — BP 126/74 | HR 67 | Temp 97.3°F | Resp 18 | Wt 167.6 lb

## 2021-01-09 DIAGNOSIS — C3412 Malignant neoplasm of upper lobe, left bronchus or lung: Secondary | ICD-10-CM | POA: Diagnosis not present

## 2021-01-09 DIAGNOSIS — C3490 Malignant neoplasm of unspecified part of unspecified bronchus or lung: Secondary | ICD-10-CM

## 2021-01-09 DIAGNOSIS — C3492 Malignant neoplasm of unspecified part of left bronchus or lung: Secondary | ICD-10-CM

## 2021-01-09 DIAGNOSIS — D6959 Other secondary thrombocytopenia: Secondary | ICD-10-CM | POA: Insufficient documentation

## 2021-01-09 DIAGNOSIS — D6481 Anemia due to antineoplastic chemotherapy: Secondary | ICD-10-CM | POA: Diagnosis not present

## 2021-01-09 LAB — CMP (CANCER CENTER ONLY)
ALT: 18 U/L (ref 0–44)
AST: 22 U/L (ref 15–41)
Albumin: 3.8 g/dL (ref 3.5–5.0)
Alkaline Phosphatase: 60 U/L (ref 38–126)
Anion gap: 5 (ref 5–15)
BUN: 18 mg/dL (ref 8–23)
CO2: 26 mmol/L (ref 22–32)
Calcium: 8.8 mg/dL — ABNORMAL LOW (ref 8.9–10.3)
Chloride: 109 mmol/L (ref 98–111)
Creatinine: 0.8 mg/dL (ref 0.44–1.00)
GFR, Estimated: >60 mL/min (ref >60)
Glucose, Bld: 98 mg/dL (ref 70–99)
Potassium: 3.6 mmol/L (ref 3.5–5.1)
Sodium: 140 mmol/L (ref 135–145)
Total Bilirubin: 0.6 mg/dL (ref 0.3–1.2)
Total Protein: 6 g/dL — ABNORMAL LOW (ref 6.5–8.1)

## 2021-01-09 LAB — CBC WITH DIFFERENTIAL (CANCER CENTER ONLY)
Abs Immature Granulocytes: 0.01 10*3/uL (ref 0.00–0.07)
Basophils Absolute: 0 10*3/uL (ref 0.0–0.1)
Basophils Relative: 0 %
Eosinophils Absolute: 0.1 10*3/uL (ref 0.0–0.5)
Eosinophils Relative: 2 %
HCT: 32.8 % — ABNORMAL LOW (ref 36.0–46.0)
Hemoglobin: 11.1 g/dL — ABNORMAL LOW (ref 12.0–15.0)
Immature Granulocytes: 0 %
Lymphocytes Relative: 32 %
Lymphs Abs: 1.5 10*3/uL (ref 0.7–4.0)
MCH: 32 pg (ref 26.0–34.0)
MCHC: 33.8 g/dL (ref 30.0–36.0)
MCV: 94.5 fL (ref 80.0–100.0)
Monocytes Absolute: 0.9 10*3/uL (ref 0.1–1.0)
Monocytes Relative: 18 %
Neutro Abs: 2.2 10*3/uL (ref 1.7–7.7)
Neutrophils Relative %: 48 %
Platelet Count: 78 10*3/uL — ABNORMAL LOW (ref 150–400)
RBC: 3.47 MIL/uL — ABNORMAL LOW (ref 3.87–5.11)
RDW: 13.1 % (ref 11.5–15.5)
WBC Count: 4.7 10*3/uL (ref 4.0–10.5)
nRBC: 0 % (ref 0.0–0.2)

## 2021-01-09 NOTE — Progress Notes (Signed)
[place] Telephone:(336) [phone]   Fax:(336) [phone]  OFFICE PROGRESS NOTE  [name], MD [place] [address]  DIAGNOSIS: Stage IIIc (T3, N3, M0) non-small cell lung cancer, adenocarcinoma presented with large left upper lobe lung mass in addition to left infrahilar and right hilar and subcarinal lymphadenopathy diagnosed in August 2021.   Molecular Biomarkers: Insufficient tissue for foundation 1. Guardant 360 results.  DETECTED ALTERATION(S) / BIOMARKER(S) % CFDNA OR AMPLIFICATION ASSOCIATED FDA-APPROVED THERAPIES CLINICAL TRIAL AVAILABILITY  EGFR L858R, 1.2%, Afatinib, Dacomitinib, Erlotinib, Gefitinib, Osimertinib, Ramucirumab Yes [id number], 1.0%, Afatinib, Dacomitinib, Erlotinib, Gefitinib, Osimertinib, Ramucirumab Yes [id number] 1.0% None  No   PRIOR THERAPY: Weekly concurrent chemoradiation with carboplatin for an AUC of 2 and paclitaxel 45 mg/m2.  First dose expected on 11/16/2019. Status post 7 cycles.   CURRENT THERAPY: Tagrisso 80 mg p.o. daily. First dose started 01/31/2020.  Status post 11 months of treatment.  INTERVAL HISTORY: Linda Woods 70 y.o. female returns to the clinic today for follow-up visit accompanied by her husband.  The patient is feeling fine today with no concerning complaints except for fingernail changes as well as rash on the left shoulder and stomach.  She also has mid back pain.  The patient continues to tolerate her treatment with Tagrisso fairly well.  She was seen for a second opinion at [facility] by Dr. Lennart Pall who assured her of her current treatment plan. She denied having any current nausea, vomiting, diarrhea or constipation.  She has no headache or visual changes.  She has no recent weight loss or night sweats.  She continues to have shortness of breath with exertion but no significant chest pain or hemoptysis.  She is here today for evaluation and repeat blood work.  MEDICAL  HISTORY: Past Medical History:  Diagnosis Date   Anemia    as a child   Anxiety    Asthma 10/19/2020   Bursitis    Chronic reflux esophagitis    Complication of anesthesia    Diarrhea, functional    Diverticulosis    GERD (gastroesophageal reflux disease)    History of kidney stones    Hypertension    Knee pain    right knee-seeing ortho   lung ca 09/2019   Osteoarthritis    Plantar fasciitis    PONV (postoperative nausea and vomiting)    has used the patch before and that helps   Pure hypercholesterolemia    RLS (restless legs syndrome)    Superficial vein thrombosis    Thyroid disease    hypothyroid    ALLERGIES:  has no active allergies.  MEDICATIONS:  Current Outpatient Medications  Medication Sig Dispense Refill   albuterol (VENTOLIN HFA) 108 (90 Base) MCG/ACT inhaler INHALE (2) PUFFS INTO THE LUNGS EVERY 6 HOURS AS NEEDED FOR WHEEZING OR SHORTNESS OF BREATH 8.5 g 0   cholecalciferol (VITAMIN D3) 25 MCG (1000 UNIT) tablet Take 1,000 Units by mouth daily. Pt states she take 2000 units/day     famotidine (PEPCID) 40 MG tablet Take 40 mg by mouth See admin instructions. 4 times a week     ferrous sulfate 325 (65 FE) MG tablet Take 325 mg by mouth daily with breakfast.     fexofenadine (ALLEGRA) 180 MG tablet Take 180 mg by mouth daily.     fluticasone (FLONASE) 50 MCG/ACT nasal spray Place 2 sprays into both nostrils daily.     HYDROcodone-acetaminophen (NORCO/VICODIN) 5-325 MG tablet  Take 0.5 tablets by mouth daily as needed (Knee pain).     HYDROcodone-homatropine (HYCODAN) 5-1.5 MG/5ML syrup Take 5 mLs by mouth daily as needed for cough.     ibuprofen (ADVIL) 200 MG tablet Take 400 mg by mouth every 6 (six) hours as needed (Knee pain).     lidocaine (LIDODERM) 5 % 1 patch daily.     LORazepam (ATIVAN) 0.5 MG tablet Take 0.5 mg by mouth at bedtime.      magic mouthwash SOLN 5 to 10 ml swish and spit or swallow QID prn mouth and esophageal pain 240 mL 2   melatonin 5  MG TABS Take 5 mg by mouth at bedtime.     metoprolol succinate (TOPROL XL) 50 MG 24 hr tablet Take 50 mg by mouth daily.      MYRBETRIQ 25 MG TB24 tablet Take 25 mg by mouth daily.     osimertinib mesylate (TAGRISSO) 80 MG tablet TAKE 1 TABLET BY MOUTH DAILY. 30 tablet 2   oxyCODONE-acetaminophen (PERCOCET/ROXICET) 5-325 MG tablet Take 1 tablet by mouth every 4 (four) hours as needed for severe pain. 45 tablet 0   oxymetazoline (AFRIN) 0.05 % nasal spray Place 1 spray into both nostrils daily as needed for congestion.     phenazopyridine (PYRIDIUM) 200 MG tablet Take 200 mg by mouth daily as needed.     prochlorperazine (COMPAZINE) 10 MG tablet Take 1 tablet (10 mg total) by mouth every 6 (six) hours as needed for nausea or vomiting. 30 tablet 0   RABEprazole (ACIPHEX) 20 MG tablet Take 1 tablet (20 mg total) by mouth 2 (two) times a week.     rosuvastatin (CRESTOR) 5 MG tablet Take 5 mg by mouth daily.     SYNTHROID 112 MCG tablet Take 112 mcg by mouth daily before breakfast.      vitamin B-12 (CYANOCOBALAMIN) 1000 MCG tablet Take 1,000 mcg by mouth daily.     No current facility-administered medications for this visit.    SURGICAL HISTORY:  Past Surgical History:  Procedure Laterality Date   APPENDECTOMY     BRONCHIAL BIOPSY  10/20/2019   Procedure: BRONCHIAL BIOPSIES;  Surgeon: Collene Gobble, MD;  Location: [facility] ENDOSCOPY;  Service: Pulmonary;;   BRONCHIAL BRUSHINGS  10/20/2019   Procedure: BRONCHIAL BRUSHINGS;  Surgeon: Collene Gobble, MD;  Location: [facility] ENDOSCOPY;  Service: Pulmonary;;   BRONCHIAL NEEDLE ASPIRATION BIOPSY  10/20/2019   Procedure: BRONCHIAL NEEDLE ASPIRATION BIOPSIES;  Surgeon: Collene Gobble, MD;  Location: [facility] ENDOSCOPY;  Service: Pulmonary;;   BRONCHIAL WASHINGS  10/20/2019   Procedure: BRONCHIAL WASHINGS;  Surgeon: Collene Gobble, MD;  Location: [facility] ENDOSCOPY;  Service: Pulmonary;;   CARPAL TUNNEL RELEASE Right 2011   Dr. Amedeo Plenty   COLPORRHAPHY     posterior    HAND SURGERY Right 2016   Nerve surgery, Dr. Amedeo Plenty   OVARIAN CYST REMOVAL     RECTOCELE REPAIR  2011   w/TVH and sling   TONSILLECTOMY     TONSILLECTOMY     TOTAL KNEE ARTHROPLASTY Left 09/09/2018   Procedure: TOTAL KNEE ARTHROPLASTY, CORTISONE INJECTION RIGHT KNEE;  Surgeon: Paralee Cancel, MD;  Location: [facility];  Service: Orthopedics;  Laterality: Left;  70 mins   TOTAL VAGINAL HYSTERECTOMY  10/18/2009   rectocele repair, sling   TUBAL LIGATION Bilateral    VARICOSE VEIN SURGERY     VIDEO BRONCHOSCOPY WITH ENDOBRONCHIAL NAVIGATION N/A 10/20/2019   Procedure: VIDEO BRONCHOSCOPY WITH ENDOBRONCHIAL NAVIGATION;  Surgeon: Baltazar Apo  S, MD;  Location: [place];  Service: Pulmonary;  Laterality: N/A;    REVIEW OF SYSTEMS:  A comprehensive review of systems was negative except for: Constitutional: positive for fatigue Respiratory: positive for cough and dyspnea on exertion   PHYSICAL EXAMINATION: General appearance: alert, cooperative, fatigued, and no distress Head: Normocephalic, without obvious abnormality, atraumatic Neck: no adenopathy, no JVD, supple, symmetrical, trachea midline, and thyroid not enlarged, symmetric, no tenderness/mass/nodules Lymph nodes: Cervical, supraclavicular, and axillary nodes normal. Resp: clear to auscultation bilaterally Back: symmetric, no curvature. ROM normal. No CVA tenderness. Cardio: regular rate and rhythm, S1, S2 normal, no murmur, click, rub or gallop GI: soft, non-tender; bowel sounds normal; no masses,  no organomegaly Extremities: extremities normal, atraumatic, no cyanosis or edema  ECOG PERFORMANCE STATUS: 1 - Symptomatic but completely ambulatory  Blood pressure 126/74, pulse 67, temperature (!) 97.3 F (36.3 C), temperature source Tympanic, resp. rate 18, weight 167 lb 9 oz (76 kg), last menstrual period 08/21/2002, SpO2 97 %.  LABORATORY DATA: Lab Results  Component Value Date   WBC 4.7 01/09/2021   HGB 11.1 (L) 01/09/2021   HCT  32.8 (L) 01/09/2021   MCV 94.5 01/09/2021   PLT 78 (L) 01/09/2021      Chemistry      Component Value Date/Time   NA 143 11/21/2020 1331   K 4.4 11/21/2020 1331   CL 108 11/21/2020 1331   CO2 25 11/21/2020 1331   BUN 13 11/21/2020 1331   CREATININE 0.88 11/21/2020 1331      Component Value Date/Time   CALCIUM 9.3 11/21/2020 1331   ALKPHOS 71 11/21/2020 1331   AST 18 11/21/2020 1331   ALT 19 11/21/2020 1331   BILITOT 0.3 11/21/2020 1331       RADIOGRAPHIC STUDIES: No results found.  ASSESSMENT AND PLAN: This is a very pleasant 70 years old white female recently diagnosed with a stage IIIc/IV (T3, N3, M0/M1a) non-small cell lung cancer, adenocarcinoma presented with large left upper lobe lung mass in addition to left infrahilar, right hilar and subcarinal lymphadenopathy and small left pleural effusion diagnosed in August 2021 with positive EGFR mutation in exon 21 (L858R). The patient completed a course of concurrent chemoradiation with weekly carboplatin and paclitaxel status post 7 cycles.  She tolerated her treatment well except for fatigue as well as a skin burn and cough. Her scan showed mild improvement of the left upper lobe lung mass as well as the mediastinal lymphadenopathy.  The patient continues to have small left pleural effusion that is suspicious given her presentation. She is currently on treatment with Tagrisso 80 mg p.o. daily started on January 31, 2020.  Status post 11.5 months of treatment. The patient continues to tolerate her treatment with Tagrisso fairly well except for mild skin rash and few episodes of diarrhea improved with Imodium. Repeat CBC today showed the persistent mild anemia and thrombocytopenia secondary to her treatment with Tagrisso. I recommended for the patient to continue her current treatment with Tagrisso with the same dose. I will see her back for follow-up visit and 6 weeks for evaluation with repeat CT scan of the chest for restaging  of her disease. The patient was advised to call immediately if she has any other concerning symptoms in the interval. The patient voices understanding of current disease status and treatment options and is in agreement with the current care plan.  All questions were answered. The patient knows to call the clinic with any problems, questions or concerns. We  can certainly see the patient much sooner if necessary.  Disclaimer: This note was dictated with voice recognition software. Similar sounding words can inadvertently be transcribed and may not be corrected upon review.

## 2021-01-10 ENCOUNTER — Other Ambulatory Visit: Payer: Self-pay | Admitting: Internal Medicine

## 2021-01-11 ENCOUNTER — Encounter: Payer: Self-pay | Admitting: Internal Medicine

## 2021-01-11 ENCOUNTER — Other Ambulatory Visit (HOSPITAL_COMMUNITY): Payer: Self-pay

## 2021-01-11 MED ORDER — ALBUTEROL SULFATE HFA 108 (90 BASE) MCG/ACT IN AERS
2.0000 | INHALATION_SPRAY | Freq: Four times a day (QID) | RESPIRATORY_TRACT | 0 refills | Status: DC | PRN
Start: 2021-01-11 — End: 2023-01-21

## 2021-01-13 ENCOUNTER — Other Ambulatory Visit (HOSPITAL_COMMUNITY): Payer: Self-pay

## 2021-01-13 ENCOUNTER — Other Ambulatory Visit: Payer: Self-pay | Admitting: Internal Medicine

## 2021-01-13 DIAGNOSIS — C3492 Malignant neoplasm of unspecified part of left bronchus or lung: Secondary | ICD-10-CM

## 2021-01-16 ENCOUNTER — Other Ambulatory Visit (HOSPITAL_COMMUNITY): Payer: Self-pay

## 2021-01-16 ENCOUNTER — Other Ambulatory Visit: Payer: Self-pay | Admitting: Internal Medicine

## 2021-01-16 DIAGNOSIS — C3492 Malignant neoplasm of unspecified part of left bronchus or lung: Secondary | ICD-10-CM

## 2021-01-16 MED ORDER — OSIMERTINIB MESYLATE 80 MG PO TABS
ORAL_TABLET | Freq: Every day | ORAL | 2 refills | Status: DC
  Filled 2021-01-16 – 2021-01-24 (×2): qty 30, 30d supply, fill #0
  Filled 2021-02-17: qty 30, 30d supply, fill #1
  Filled 2021-03-17: qty 30, 30d supply, fill #2

## 2021-01-18 ENCOUNTER — Telehealth: Payer: Self-pay | Admitting: Internal Medicine

## 2021-01-18 NOTE — Telephone Encounter (Signed)
Sch per 11/18 los, pt aware

## 2021-01-24 ENCOUNTER — Other Ambulatory Visit (HOSPITAL_COMMUNITY): Payer: Self-pay

## 2021-01-25 ENCOUNTER — Other Ambulatory Visit (HOSPITAL_COMMUNITY): Payer: Self-pay

## 2021-02-17 ENCOUNTER — Other Ambulatory Visit (HOSPITAL_COMMUNITY): Payer: Self-pay

## 2021-02-19 ENCOUNTER — Encounter: Payer: Self-pay | Admitting: Internal Medicine

## 2021-02-21 ENCOUNTER — Encounter: Payer: Self-pay | Admitting: Internal Medicine

## 2021-02-24 ENCOUNTER — Ambulatory Visit (HOSPITAL_COMMUNITY)
Admission: RE | Admit: 2021-02-24 | Discharge: 2021-02-24 | Disposition: A | Discharge status: Home / Self Care | Payer: Medicare Other | Source: Ambulatory Visit | Attending: Internal Medicine | Admitting: Internal Medicine

## 2021-02-24 ENCOUNTER — Other Ambulatory Visit (HOSPITAL_COMMUNITY): Payer: Self-pay

## 2021-02-24 ENCOUNTER — Inpatient Hospital Stay: Payer: Medicare Other | Attending: Internal Medicine

## 2021-02-24 ENCOUNTER — Other Ambulatory Visit: Payer: Self-pay

## 2021-02-24 ENCOUNTER — Encounter (HOSPITAL_COMMUNITY): Payer: Self-pay

## 2021-02-24 DIAGNOSIS — C3412 Malignant neoplasm of upper lobe, left bronchus or lung: Secondary | ICD-10-CM | POA: Insufficient documentation

## 2021-02-24 DIAGNOSIS — C3490 Malignant neoplasm of unspecified part of unspecified bronchus or lung: Secondary | ICD-10-CM

## 2021-02-24 DIAGNOSIS — R197 Diarrhea, unspecified: Secondary | ICD-10-CM | POA: Insufficient documentation

## 2021-02-24 LAB — CBC WITH DIFFERENTIAL (CANCER CENTER ONLY)
Abs Immature Granulocytes: 0.01 10*3/uL (ref 0.00–0.07)
Basophils Absolute: 0 10*3/uL (ref 0.0–0.1)
Basophils Relative: 0 %
Eosinophils Absolute: 0.2 10*3/uL (ref 0.0–0.5)
Eosinophils Relative: 3 %
HCT: 37.4 % (ref 36.0–46.0)
Hemoglobin: 12.5 g/dL (ref 12.0–15.0)
Immature Granulocytes: 0 %
Lymphocytes Relative: 20 %
Lymphs Abs: 1 10*3/uL (ref 0.7–4.0)
MCH: 31.8 pg (ref 26.0–34.0)
MCHC: 33.4 g/dL (ref 30.0–36.0)
MCV: 95.2 fL (ref 80.0–100.0)
Monocytes Absolute: 0.5 10*3/uL (ref 0.1–1.0)
Monocytes Relative: 9 %
Neutro Abs: 3.6 10*3/uL (ref 1.7–7.7)
Neutrophils Relative %: 68 %
Platelet Count: 97 10*3/uL — ABNORMAL LOW (ref 150–400)
RBC: 3.93 MIL/uL (ref 3.87–5.11)
RDW: 12.7 % (ref 11.5–15.5)
WBC Count: 5.2 10*3/uL (ref 4.0–10.5)
nRBC: 0 % (ref 0.0–0.2)

## 2021-02-24 LAB — CMP (CANCER CENTER ONLY)
ALT: 18 U/L (ref 0–44)
AST: 18 U/L (ref 15–41)
Albumin: 4.3 g/dL (ref 3.5–5.0)
Alkaline Phosphatase: 82 U/L (ref 38–126)
Anion gap: 7 (ref 5–15)
BUN: 17 mg/dL (ref 8–23)
CO2: 27 mmol/L (ref 22–32)
Calcium: 9.6 mg/dL (ref 8.9–10.3)
Chloride: 106 mmol/L (ref 98–111)
Creatinine: 0.89 mg/dL (ref 0.44–1.00)
GFR, Estimated: >60 mL/min (ref >60)
Glucose, Bld: 105 mg/dL — ABNORMAL HIGH (ref 70–99)
Potassium: 4.5 mmol/L (ref 3.5–5.1)
Sodium: 140 mmol/L (ref 135–145)
Total Bilirubin: 0.6 mg/dL (ref 0.3–1.2)
Total Protein: 6.9 g/dL (ref 6.5–8.1)

## 2021-02-24 MED ORDER — IOHEXOL 350 MG/ML SOLN
60.0000 mL | Freq: Once | INTRAVENOUS | Status: AC | PRN
  Administered 2021-02-24: 60 mL via INTRAVENOUS

## 2021-02-24 MED ORDER — SODIUM CHLORIDE (PF) 0.9 % IJ SOLN
INTRAMUSCULAR | Status: AC
  Filled 2021-02-24: qty 50

## 2021-02-27 ENCOUNTER — Other Ambulatory Visit (HOSPITAL_COMMUNITY): Payer: Self-pay

## 2021-02-27 ENCOUNTER — Encounter: Payer: Self-pay | Admitting: Internal Medicine

## 2021-02-27 ENCOUNTER — Inpatient Hospital Stay: Payer: Medicare Other | Admitting: Internal Medicine

## 2021-02-27 ENCOUNTER — Other Ambulatory Visit: Payer: Self-pay

## 2021-02-27 VITALS — BP 110/66 | HR 89 | Temp 97.7°F | Resp 16 | Ht 65.5 in | Wt 164.2 lb

## 2021-02-27 DIAGNOSIS — C3412 Malignant neoplasm of upper lobe, left bronchus or lung: Secondary | ICD-10-CM | POA: Diagnosis not present

## 2021-02-27 DIAGNOSIS — C3492 Malignant neoplasm of unspecified part of left bronchus or lung: Secondary | ICD-10-CM

## 2021-02-27 DIAGNOSIS — R197 Diarrhea, unspecified: Secondary | ICD-10-CM | POA: Diagnosis not present

## 2021-02-27 NOTE — Progress Notes (Signed)
[place] [phone]   Fax:(336) [phone]  OFFICE PROGRESS NOTE  [name], MD [place] [address]  DIAGNOSIS: Stage IIIc (T3, N3, M0) non-small cell lung cancer, adenocarcinoma presented with large left upper lobe lung mass in addition to left infrahilar and right hilar and subcarinal lymphadenopathy diagnosed in August 2021.   Molecular Biomarkers: Insufficient tissue for foundation 1. Guardant 360 results.  DETECTED ALTERATION(S) / BIOMARKER(S) % CFDNA OR AMPLIFICATION ASSOCIATED FDA-APPROVED THERAPIES CLINICAL TRIAL AVAILABILITY  EGFR L858R, 1.2%, Afatinib, Dacomitinib, Erlotinib, Gefitinib, Osimertinib, Ramucirumab Yes [id number], 1.0%, Afatinib, Dacomitinib, Erlotinib, Gefitinib, Osimertinib, Ramucirumab Yes [id number] 1.0% None  No   PRIOR THERAPY: Weekly concurrent chemoradiation with carboplatin for an AUC of 2 and paclitaxel 45 mg/m2.  First dose expected on 11/16/2019. Status post 7 cycles.   CURRENT THERAPY: Tagrisso 80 mg p.o. daily. First dose started 01/31/2020.  Status post 13 months of treatment.  INTERVAL HISTORY: Linda Woods 71 y.o. female returns to the clinic today for follow-up visit.  The patient is feeling fine today with no concerning complaints except for the low back pain and arthralgia of the right knee.  She also has occasional shortness of breath increased with exertion.  She denied having any chest pain, cough or hemoptysis.  She denied having any nausea, vomiting has occasional diarrhea with no constipation or abdominal pain.  She has no recent weight loss or night sweats.  She has no headache or visual changes.  She continues to tolerate her treatment with Tagrisso fairly well.  The patient had repeat CT scan of the chest, performed recently and she is here today for evaluation and discussion of her scan results.  MEDICAL HISTORY: Past Medical History:  Diagnosis Date   Anemia    as a  child   Anxiety    Asthma 10/19/2020   Bursitis    Chronic reflux esophagitis    Complication of anesthesia    Diarrhea, functional    Diverticulosis    GERD (gastroesophageal reflux disease)    History of kidney stones    Hypertension    Knee pain    right knee-seeing ortho   lung ca 09/2019   Osteoarthritis    Plantar fasciitis    PONV (postoperative nausea and vomiting)    has used the patch before and that helps   Pure hypercholesterolemia    RLS (restless legs syndrome)    Superficial vein thrombosis    Thyroid disease    hypothyroid    ALLERGIES:  has no active allergies.  MEDICATIONS:  Current Outpatient Medications  Medication Sig Dispense Refill   albuterol (VENTOLIN HFA) 108 (90 Base) MCG/ACT inhaler Inhale 2 puffs into the lungs every 6 (six) hours as needed for wheezing or shortness of breath. 8.5 g 0   cholecalciferol (VITAMIN D3) 25 MCG (1000 UNIT) tablet Take 1,000 Units by mouth daily. Pt states she take 2000 units/day     Difluprednate 0.05 % EMUL Place 1 drop into the left eye 3 (three) times daily.     famotidine (PEPCID) 40 MG tablet Take 40 mg by mouth See admin instructions. 4 times a week     ferrous sulfate 325 (65 FE) MG tablet Take 325 mg by mouth daily with breakfast.     fexofenadine (ALLEGRA) 180 MG tablet Take 180 mg by mouth daily.     fluticasone (FLONASE) 50 MCG/ACT nasal spray Place 2 sprays into both nostrils daily.  HYDROcodone-acetaminophen (NORCO/VICODIN) 5-325 MG tablet Take 0.5 tablets by mouth daily as needed (Knee pain).     HYDROcodone-homatropine (HYCODAN) 5-1.5 MG/5ML syrup Take 5 mLs by mouth daily as needed for cough.     ibuprofen (ADVIL) 200 MG tablet Take 400 mg by mouth every 6 (six) hours as needed (Knee pain).     lidocaine (LIDODERM) 5 % 1 patch daily.     loperamide (IMODIUM) 2 MG capsule Take 2-4 mg by mouth every 6 (six) hours as needed.     LORazepam (ATIVAN) 0.5 MG tablet Take 0.5 mg by mouth at bedtime.       magic mouthwash SOLN 5 to 10 ml swish and spit or swallow QID prn mouth and esophageal pain 240 mL 2   melatonin 5 MG TABS Take 5 mg by mouth at bedtime.     metoprolol succinate (TOPROL XL) 50 MG 24 hr tablet Take 50 mg by mouth daily.      MYRBETRIQ 25 MG TB24 tablet Take 25 mg by mouth daily.     osimertinib mesylate (TAGRISSO) 80 MG tablet TAKE 1 TABLET BY MOUTH DAILY. 30 tablet 2   oxyCODONE-acetaminophen (PERCOCET/ROXICET) 5-325 MG tablet Take 1 tablet by mouth every 4 (four) hours as needed for severe pain. 45 tablet 0   oxymetazoline (AFRIN) 0.05 % nasal spray Place 1 spray into both nostrils daily as needed for congestion.     phenazopyridine (PYRIDIUM) 200 MG tablet Take 200 mg by mouth daily as needed.     prochlorperazine (COMPAZINE) 10 MG tablet Take 1 tablet (10 mg total) by mouth every 6 (six) hours as needed for nausea or vomiting. 30 tablet 0   RABEprazole (ACIPHEX) 20 MG tablet Take 1 tablet (20 mg total) by mouth 2 (two) times a week.     rosuvastatin (CRESTOR) 5 MG tablet Take 5 mg by mouth daily.     SYNTHROID 112 MCG tablet Take 112 mcg by mouth daily before breakfast.      vitamin B-12 (CYANOCOBALAMIN) 1000 MCG tablet Take 1,000 mcg by mouth daily.     No current facility-administered medications for this visit.    SURGICAL HISTORY:  Past Surgical History:  Procedure Laterality Date   APPENDECTOMY     BRONCHIAL BIOPSY  10/20/2019   Procedure: BRONCHIAL BIOPSIES;  Surgeon: Collene Gobble, MD;  Location: [facility] ENDOSCOPY;  Service: Pulmonary;;   BRONCHIAL BRUSHINGS  10/20/2019   Procedure: BRONCHIAL BRUSHINGS;  Surgeon: Collene Gobble, MD;  Location: [facility] ENDOSCOPY;  Service: Pulmonary;;   BRONCHIAL NEEDLE ASPIRATION BIOPSY  10/20/2019   Procedure: BRONCHIAL NEEDLE ASPIRATION BIOPSIES;  Surgeon: Collene Gobble, MD;  Location[facility] ENDOSCOPY;  Service: Pulmonary;;   BRONCHIAL WASHINGS  10/20/2019   Procedure: BRONCHIAL WASHINGS;  Surgeon: Collene Gobble, MD;  Location: [facility]  ENDOSCOPY;  Service: Pulmonary;;   CARPAL TUNNEL RELEASE Right 2011   Dr. Amedeo Plenty   COLPORRHAPHY     posterior   HAND SURGERY Right 2016   Nerve surgery, Dr. Amedeo Plenty   OVARIAN CYST REMOVAL     RECTOCELE REPAIR  2011   w/TVH and sling   TONSILLECTOMY     TONSILLECTOMY     TOTAL KNEE ARTHROPLASTY Left 09/09/2018   Procedure: TOTAL KNEE ARTHROPLASTY, CORTISONE INJECTION RIGHT KNEE;  Surgeon: Paralee Cancel, MD;  Location: [facility];  Service: Orthopedics;  Laterality: Left;  70 mins   TOTAL VAGINAL HYSTERECTOMY  10/18/2009   rectocele repair, sling   TUBAL LIGATION Bilateral    VARICOSE  VEIN SURGERY     VIDEO BRONCHOSCOPY WITH ENDOBRONCHIAL NAVIGATION N/A 10/20/2019   Procedure: VIDEO BRONCHOSCOPY WITH ENDOBRONCHIAL NAVIGATION;  Surgeon: Collene Gobble, MD;  Location: [place] ENDOSCOPY;  Service: Pulmonary;  Laterality: N/A;    REVIEW OF SYSTEMS:  Constitutional: negative Eyes: negative Ears, nose, mouth, throat, and face: negative Respiratory: positive for dyspnea on exertion Cardiovascular: negative Gastrointestinal: positive for diarrhea Genitourinary:negative Integument/breast: negative Hematologic/lymphatic: negative Musculoskeletal:positive for arthralgias Neurological: negative Behavioral/Psych: negative Endocrine: negative Allergic/Immunologic: negative   PHYSICAL EXAMINATION: General appearance: alert, cooperative, fatigued, and no distress Head: Normocephalic, without obvious abnormality, atraumatic Neck: no adenopathy, no JVD, supple, symmetrical, trachea midline, and thyroid not enlarged, symmetric, no tenderness/mass/nodules Lymph nodes: Cervical, supraclavicular, and axillary nodes normal. Resp: clear to auscultation bilaterally Back: symmetric, no curvature. ROM normal. No CVA tenderness. Cardio: regular rate and rhythm, S1, S2 normal, no murmur, click, rub or gallop GI: soft, non-tender; bowel sounds normal; no masses,  no organomegaly Extremities: extremities normal,  atraumatic, no cyanosis or edema Neurologic: Alert and oriented X 3, normal strength and tone. Normal symmetric reflexes. Normal coordination and gait  ECOG PERFORMANCE STATUS: 1 - Symptomatic but completely ambulatory  Blood pressure 110/66, pulse 89, temperature 97.7 F (36.5 C), temperature source Axillary, resp. rate 16, height 5' 5.5" (1.664 m), weight 164 lb 3.2 oz (74.5 kg), last menstrual period 08/21/2002, SpO2 98 %.  LABORATORY DATA: Lab Results  Component Value Date   WBC 5.2 02/24/2021   HGB 12.5 02/24/2021   HCT 37.4 02/24/2021   MCV 95.2 02/24/2021   PLT 97 (L) 02/24/2021      Chemistry      Component Value Date/Time   NA 140 02/24/2021 1043   K 4.5 02/24/2021 1043   CL 106 02/24/2021 1043   CO2 27 02/24/2021 1043   BUN 17 02/24/2021 1043   CREATININE 0.89 02/24/2021 1043      Component Value Date/Time   CALCIUM 9.6 02/24/2021 1043   ALKPHOS 82 02/24/2021 1043   AST 18 02/24/2021 1043   ALT 18 02/24/2021 1043   BILITOT 0.6 02/24/2021 1043       RADIOGRAPHIC STUDIES: CT Chest W Contrast  Result Date: 02/25/2021 CLINICAL DATA:  Restaging non-small cell lung cancer. EXAM: CT CHEST WITH CONTRAST TECHNIQUE: Multidetector CT imaging of the chest was performed during intravenous contrast administration. CONTRAST:  55m OMNIPAQUE IOHEXOL 350 MG/ML SOLN COMPARISON:  Multiple prior imaging studies. The most recent CT scan is 11/18/2020. Most recent PET scan is 11/02/2019 FINDINGS: Cardiovascular: The heart is normal in size. No pericardial effusion. The aorta is normal in caliber. No dissection. The branch vessels are patent. Stable scattered coronary artery calcifications. Mediastinum/Nodes: No mediastinal or hilar mass or lymphadenopathy. Small scattered lymph nodes are stable. The esophagus is grossly normal. There is a stable moderate-sized hiatal hernia. 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 No change since prior studies. This was not hypermetabolic on the prior PET-CT. 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 No findings suspicious for recurrent tumor. No worrisome pulmonary nodules to suggest pulmonary metastatic disease. No acute overlying pulmonary process. No pleural effusions or pleural nodules. Stable underlying emphysematous changes. Upper Abdomen: No significant upper abdominal findings. No hepatic lesions are identified. Stable right adrenal gland adenoma. Stable right renal cyst. Musculoskeletal: No breast masses, supraclavicular or axillary adenopathy. The thyroid gland is unremarkable. The bony thorax is intact. No worrisome  bone lesions. Stable small sclerotic bone lesions involving T10 and T11. IMPRESSION: 1. 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 No findings suspicious for recurrent tumor, mediastinal/hilar adenopathy or pulmonary metastatic disease. 2. 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 3. Stable moderate-sized hiatal hernia. 4. Stable right adrenal gland adenoma. 5. Stable small sclerotic bone lesions involving T10 and T11. Aortic Atherosclerosis (ICD10-I70.0) and Emphysema (ICD10-J43.9). Electronically Signed   By: Marijo Sanes M.D.   On: 02/25/2021 10:32    ASSESSMENT AND PLAN: This is a very pleasant 71 years old white female recently diagnosed with a stage IIIc/IV (T3, N3, M0/M1a) non-small cell lung cancer, adenocarcinoma presented with large left upper lobe lung mass in addition to left infrahilar, right hilar and subcarinal lymphadenopathy and small left pleural effusion diagnosed in August 2021 with positive EGFR mutation in exon 21 (L858R). The patient completed a course of concurrent chemoradiation with weekly carboplatin and paclitaxel status post 7 cycles.  She tolerated her treatment well except for fatigue  as well as a skin burn and cough. Her scan showed mild improvement of the left upper lobe lung mass as well as the mediastinal lymphadenopathy.  The patient continues to have small left pleural effusion that is suspicious given her presentation. She is currently on treatment with Tagrisso 80 mg p.o. daily started on January 31, 2020.  She is status post 13 months of treatment and has been tolerating her treatment well except for occasional diarrhea. The patient had repeat CT scan of the chest performed recently.  I personally and independently reviewed the scans and discussed the results with the patient today. Her scan showed no concerning findings for disease progression. I recommended for her to continue her current treatment with Tagrisso with the same dose. I will see her back for follow-up visit in 6 weeks for evaluation and repeat blood work. For the diarrhea she will continue her current treatment with Imodium on as-needed basis. For the arthralgia, she will reach out to her orthopedic surgeon for evaluation and management of her condition. The patient was advised to call immediately if she has any other concerning symptoms in the interval. The patient voices understanding of current disease status and treatment options and is in agreement with the current care plan.  All questions were answered. The patient knows to call the clinic with any problems, questions or concerns. We can certainly see the patient much sooner if necessary.  Disclaimer: This note was dictated with voice recognition software. Similar sounding words can inadvertently be transcribed and may not be corrected upon review.

## 2021-03-17 ENCOUNTER — Other Ambulatory Visit (HOSPITAL_COMMUNITY): Payer: Self-pay

## 2021-03-23 ENCOUNTER — Telehealth: Payer: Self-pay | Admitting: Internal Medicine

## 2021-03-23 NOTE — Telephone Encounter (Signed)
Scheduled per los, patient has been called and notified of upcoming appointments.

## 2021-03-24 ENCOUNTER — Other Ambulatory Visit (HOSPITAL_COMMUNITY): Payer: Self-pay

## 2021-04-05 ENCOUNTER — Telehealth: Payer: Self-pay | Admitting: Internal Medicine

## 2021-04-05 ENCOUNTER — Telehealth: Payer: Self-pay

## 2021-04-05 NOTE — Telephone Encounter (Signed)
Pt  called and LM stating she tested positive for COVID today 04/05/21. Pt is symptomatic.  Schedule message has been sent to push pts appts out 21 days.

## 2021-04-05 NOTE — Telephone Encounter (Signed)
R/s pt due to covid +, pt aware

## 2021-04-10 ENCOUNTER — Inpatient Hospital Stay: Payer: Medicare Other

## 2021-04-10 ENCOUNTER — Inpatient Hospital Stay: Payer: Medicare Other | Admitting: Internal Medicine

## 2021-04-11 ENCOUNTER — Other Ambulatory Visit (HOSPITAL_COMMUNITY): Payer: Self-pay

## 2021-04-13 ENCOUNTER — Ambulatory Visit: Payer: Medicare Other | Admitting: Internal Medicine

## 2021-04-13 ENCOUNTER — Other Ambulatory Visit: Payer: Medicare Other

## 2021-04-14 ENCOUNTER — Other Ambulatory Visit: Payer: Self-pay | Admitting: Internal Medicine

## 2021-04-14 ENCOUNTER — Other Ambulatory Visit (HOSPITAL_COMMUNITY): Payer: Self-pay

## 2021-04-14 DIAGNOSIS — C3492 Malignant neoplasm of unspecified part of left bronchus or lung: Secondary | ICD-10-CM

## 2021-04-14 MED ORDER — OSIMERTINIB MESYLATE 80 MG PO TABS
ORAL_TABLET | Freq: Every day | ORAL | 2 refills | Status: DC
  Filled 2021-04-14: qty 30, 30d supply, fill #0
  Filled 2021-05-15: qty 30, 30d supply, fill #1
  Filled 2021-06-15: qty 30, 30d supply, fill #2

## 2021-04-15 ENCOUNTER — Other Ambulatory Visit (HOSPITAL_COMMUNITY): Payer: Self-pay

## 2021-04-17 ENCOUNTER — Other Ambulatory Visit (HOSPITAL_COMMUNITY): Payer: Self-pay

## 2021-04-18 ENCOUNTER — Other Ambulatory Visit (HOSPITAL_COMMUNITY): Payer: Self-pay

## 2021-04-19 ENCOUNTER — Other Ambulatory Visit (HOSPITAL_COMMUNITY): Payer: Self-pay

## 2021-04-21 DIAGNOSIS — C3492 Malignant neoplasm of unspecified part of left bronchus or lung: Secondary | ICD-10-CM | POA: Diagnosis not present

## 2021-04-21 DIAGNOSIS — F419 Anxiety disorder, unspecified: Secondary | ICD-10-CM | POA: Diagnosis not present

## 2021-04-21 DIAGNOSIS — E039 Hypothyroidism, unspecified: Secondary | ICD-10-CM | POA: Diagnosis not present

## 2021-04-21 DIAGNOSIS — R252 Cramp and spasm: Secondary | ICD-10-CM | POA: Diagnosis not present

## 2021-04-25 DIAGNOSIS — Z79899 Other long term (current) drug therapy: Secondary | ICD-10-CM | POA: Diagnosis not present

## 2021-04-25 DIAGNOSIS — D509 Iron deficiency anemia, unspecified: Secondary | ICD-10-CM | POA: Diagnosis not present

## 2021-04-25 DIAGNOSIS — R7301 Impaired fasting glucose: Secondary | ICD-10-CM | POA: Diagnosis not present

## 2021-04-25 DIAGNOSIS — E039 Hypothyroidism, unspecified: Secondary | ICD-10-CM | POA: Diagnosis not present

## 2021-04-27 ENCOUNTER — Inpatient Hospital Stay: Payer: Medicare Other | Attending: Internal Medicine

## 2021-04-27 ENCOUNTER — Other Ambulatory Visit: Payer: Self-pay

## 2021-04-27 ENCOUNTER — Encounter: Payer: Self-pay | Admitting: Internal Medicine

## 2021-04-27 ENCOUNTER — Inpatient Hospital Stay: Payer: Medicare Other | Admitting: Internal Medicine

## 2021-04-27 VITALS — BP 110/73 | HR 101 | Temp 97.8°F | Resp 17 | Ht 65.5 in | Wt 167.3 lb

## 2021-04-27 DIAGNOSIS — C3490 Malignant neoplasm of unspecified part of unspecified bronchus or lung: Secondary | ICD-10-CM | POA: Diagnosis not present

## 2021-04-27 DIAGNOSIS — Z923 Personal history of irradiation: Secondary | ICD-10-CM | POA: Diagnosis not present

## 2021-04-27 DIAGNOSIS — C3412 Malignant neoplasm of upper lobe, left bronchus or lung: Secondary | ICD-10-CM | POA: Diagnosis not present

## 2021-04-27 DIAGNOSIS — Z79899 Other long term (current) drug therapy: Secondary | ICD-10-CM | POA: Insufficient documentation

## 2021-04-27 DIAGNOSIS — Z9221 Personal history of antineoplastic chemotherapy: Secondary | ICD-10-CM | POA: Diagnosis not present

## 2021-04-27 DIAGNOSIS — Z5111 Encounter for antineoplastic chemotherapy: Secondary | ICD-10-CM

## 2021-04-27 DIAGNOSIS — C3492 Malignant neoplasm of unspecified part of left bronchus or lung: Secondary | ICD-10-CM

## 2021-04-27 LAB — CBC WITH DIFFERENTIAL (CANCER CENTER ONLY)
Abs Immature Granulocytes: 0.02 10*3/uL (ref 0.00–0.07)
Basophils Absolute: 0 10*3/uL (ref 0.0–0.1)
Basophils Relative: 1 %
Eosinophils Absolute: 0.2 10*3/uL (ref 0.0–0.5)
Eosinophils Relative: 5 %
HCT: 33.1 % — ABNORMAL LOW (ref 36.0–46.0)
Hemoglobin: 11.6 g/dL — ABNORMAL LOW (ref 12.0–15.0)
Immature Granulocytes: 1 %
Lymphocytes Relative: 21 %
Lymphs Abs: 0.8 10*3/uL (ref 0.7–4.0)
MCH: 33 pg (ref 26.0–34.0)
MCHC: 35 g/dL (ref 30.0–36.0)
MCV: 94.3 fL (ref 80.0–100.0)
Monocytes Absolute: 0.5 10*3/uL (ref 0.1–1.0)
Monocytes Relative: 13 %
Neutro Abs: 2.4 10*3/uL (ref 1.7–7.7)
Neutrophils Relative %: 59 %
Platelet Count: 88 10*3/uL — ABNORMAL LOW (ref 150–400)
RBC: 3.51 MIL/uL — ABNORMAL LOW (ref 3.87–5.11)
RDW: 13.6 % (ref 11.5–15.5)
WBC Count: 3.9 10*3/uL — ABNORMAL LOW (ref 4.0–10.5)
nRBC: 0 % (ref 0.0–0.2)

## 2021-04-27 LAB — CMP (CANCER CENTER ONLY)
ALT: 14 U/L (ref 0–44)
AST: 16 U/L (ref 15–41)
Albumin: 4.3 g/dL (ref 3.5–5.0)
Alkaline Phosphatase: 70 U/L (ref 38–126)
Anion gap: 7 (ref 5–15)
BUN: 15 mg/dL (ref 8–23)
CO2: 27 mmol/L (ref 22–32)
Calcium: 9.3 mg/dL (ref 8.9–10.3)
Chloride: 106 mmol/L (ref 98–111)
Creatinine: 0.93 mg/dL (ref 0.44–1.00)
GFR, Estimated: >60 mL/min (ref >60)
Glucose, Bld: 98 mg/dL (ref 70–99)
Potassium: 4.1 mmol/L (ref 3.5–5.1)
Sodium: 140 mmol/L (ref 135–145)
Total Bilirubin: 0.4 mg/dL (ref 0.3–1.2)
Total Protein: 6.4 g/dL — ABNORMAL LOW (ref 6.5–8.1)

## 2021-04-27 NOTE — Progress Notes (Signed)
[place] [phone]   [phone]) [phone]  OFFICE PROGRESS NOTE  Linda Evens, MD [place] [address]  DIAGNOSIS: Stage IIIc (T3, N3, M0) non-small cell lung cancer, adenocarcinoma presented with large left upper lobe lung mass in addition to left infrahilar and right hilar and subcarinal lymphadenopathy diagnosed in August 2021.   Molecular Biomarkers: Insufficient tissue for foundation 1. Guardant 360 results.  DETECTED ALTERATION(S) / BIOMARKER(S) % CFDNA OR AMPLIFICATION ASSOCIATED FDA-APPROVED THERAPIES CLINICAL TRIAL AVAILABILITY  EGFR L858R, 1.2%, Afatinib, Dacomitinib, Erlotinib, Gefitinib, Osimertinib, Ramucirumab Yes [id number], 1.0%, Afatinib, Dacomitinib, Erlotinib, Gefitinib, Osimertinib, Ramucirumab Yes [id number] 1.0% None  No   PRIOR THERAPY: Weekly concurrent chemoradiation with carboplatin for an AUC of 2 and paclitaxel 45 mg/m2.  First dose expected on 11/16/2019. Status post 7 cycles.   CURRENT THERAPY: Tagrisso 80 mg p.o. daily. First dose started 01/31/2020.  Status post 14.5 months of treatment.  INTERVAL HISTORY: Linda Woods 71 y.o. female returns to the clinic today for follow-up visit.  The patient is feeling fine today with no concerning complaints except for fatigue and feeling down recently.  She would like to see a psychologist for consultation if possible.  She denied having any current chest pain, shortness of breath except with exertion but she has mild cough with no hemoptysis.  She denied having any fever or chills.  She has no nausea, vomiting but has occasional diarrhea with no constipation or abdominal pain.  She denied having any recent weight loss or night sweats.  She continues to tolerate her treatment with Tagrisso fairly well.  She is here today for evaluation and repeat blood work.   MEDICAL HISTORY: Past Medical History:  Diagnosis Date   Anemia    as a child   Anxiety     Asthma 10/19/2020   Bursitis    Chronic reflux esophagitis    Complication of anesthesia    Diarrhea, functional    Diverticulosis    GERD (gastroesophageal reflux disease)    History of kidney stones    Hypertension    Knee pain    right knee-seeing ortho   lung ca 09/2019   Osteoarthritis    Plantar fasciitis    PONV (postoperative nausea and vomiting)    has used the patch before and that helps   Pure hypercholesterolemia    RLS (restless legs syndrome)    Superficial vein thrombosis    Thyroid disease    hypothyroid    ALLERGIES:  has no active allergies.  MEDICATIONS:  Current Outpatient Medications  Medication Sig Dispense Refill   albuterol (VENTOLIN HFA) 108 (90 Base) MCG/ACT inhaler Inhale 2 puffs into the lungs every 6 (six) hours as needed for wheezing or shortness of breath. 8.5 g 0   cholecalciferol (VITAMIN D3) 25 MCG (1000 UNIT) tablet Take 1,000 Units by mouth daily. Pt states she take 2000 units/day     Difluprednate 0.05 % EMUL Place 1 drop into the left eye 3 (three) times daily.     famotidine (PEPCID) 40 MG tablet Take 40 mg by mouth See admin instructions. 4 times a week     ferrous sulfate 325 (65 FE) MG tablet Take 325 mg by mouth daily with breakfast.     fexofenadine (ALLEGRA) 180 MG tablet Take 180 mg by mouth daily.     fluticasone (FLONASE) 50 MCG/ACT nasal spray Place 2 sprays into both nostrils daily.     HYDROcodone-acetaminophen (NORCO/VICODIN) 5-325  MG tablet Take 0.5 tablets by mouth daily as needed (Knee pain).     HYDROcodone-homatropine (HYCODAN) 5-1.5 MG/5ML syrup Take 5 mLs by mouth daily as needed for cough.     ibuprofen (ADVIL) 200 MG tablet Take 400 mg by mouth every 6 (six) hours as needed (Knee pain).     lidocaine (LIDODERM) 5 % 1 patch daily.     loperamide (IMODIUM) 2 MG capsule Take 2-4 mg by mouth every 6 (six) hours as needed.     LORazepam (ATIVAN) 0.5 MG tablet Take 0.5 mg by mouth at bedtime.      magic mouthwash SOLN 5 to  10 ml swish and spit or swallow QID prn mouth and esophageal pain 240 mL 2   melatonin 5 MG TABS Take 5 mg by mouth at bedtime.     metoprolol succinate (TOPROL XL) 50 MG 24 hr tablet Take 50 mg by mouth daily.      MYRBETRIQ 50 MG TB24 tablet Take 50 mg by mouth daily.     osimertinib mesylate (TAGRISSO) 80 MG tablet TAKE 1 TABLET BY MOUTH DAILY. 30 tablet 2   oxyCODONE-acetaminophen (PERCOCET/ROXICET) 5-325 MG tablet Take 1 tablet by mouth every 4 (four) hours as needed for severe pain. 45 tablet 0   oxymetazoline (AFRIN) 0.05 % nasal spray Place 1 spray into both nostrils daily as needed for congestion.     phenazopyridine (PYRIDIUM) 200 MG tablet Take 200 mg by mouth daily as needed.     prochlorperazine (COMPAZINE) 10 MG tablet Take 1 tablet (10 mg total) by mouth every 6 (six) hours as needed for nausea or vomiting. 30 tablet 0   RABEprazole (ACIPHEX) 20 MG tablet Take 1 tablet (20 mg total) by mouth 2 (two) times a week.     rosuvastatin (CRESTOR) 5 MG tablet Take 5 mg by mouth daily.     SYNTHROID 112 MCG tablet Take 112 mcg by mouth daily before breakfast.      vitamin B-12 (CYANOCOBALAMIN) 1000 MCG tablet Take 1,000 mcg by mouth daily.     No current facility-administered medications for this visit.    SURGICAL HISTORY:  Past Surgical History:  Procedure Laterality Date   APPENDECTOMY     BRONCHIAL BIOPSY  10/20/2019   Procedure: BRONCHIAL BIOPSIES;  Surgeon: Collene Gobble, MD;  Location: [facility] ENDOSCOPY;  Service: Pulmonary;;   BRONCHIAL BRUSHINGS  10/20/2019   Procedure: BRONCHIAL BRUSHINGS;  Surgeon: Collene Gobble, MD;  Location: [facility] ENDOSCOPY;  Service: Pulmonary;;   BRONCHIAL NEEDLE ASPIRATION BIOPSY  10/20/2019   Procedure: BRONCHIAL NEEDLE ASPIRATION BIOPSIES;  Surgeon: Collene Gobble, MD;  Location: [facility] ENDOSCOPY;  Service: Pulmonary;;   BRONCHIAL WASHINGS  10/20/2019   Procedure: BRONCHIAL WASHINGS;  Surgeon: Collene Gobble, MD;  Location: [facility] ENDOSCOPY;  Service:  Pulmonary;;   CARPAL TUNNEL RELEASE Right 2011   Dr. Amedeo Plenty   COLPORRHAPHY     posterior   HAND SURGERY Right 2016   Nerve surgery, Dr. Amedeo Plenty   OVARIAN CYST REMOVAL     RECTOCELE REPAIR  2011   w/TVH and sling   TONSILLECTOMY     TONSILLECTOMY     TOTAL KNEE ARTHROPLASTY Left 09/09/2018   Procedure: TOTAL KNEE ARTHROPLASTY, CORTISONE INJECTION RIGHT KNEE;  Surgeon: Paralee Cancel, MD;  Location: [facility] ORS;  Service: Orthopedics;  Laterality: Left;  70 mins   TOTAL VAGINAL HYSTERECTOMY  10/18/2009   rectocele repair, sling   TUBAL LIGATION Bilateral    VARICOSE VEIN SURGERY  VIDEO BRONCHOSCOPY WITH ENDOBRONCHIAL NAVIGATION N/A 10/20/2019   Procedure: VIDEO BRONCHOSCOPY WITH ENDOBRONCHIAL NAVIGATION;  Surgeon: Collene Gobble, MD;  Location: [facility] ENDOSCOPY;  Service: Pulmonary;  Laterality: N/A;    REVIEW OF SYSTEMS:  Constitutional: positive for fatigue Eyes: negative Ears, nose, mouth, throat, and face: negative Respiratory: positive for cough and dyspnea on exertion Cardiovascular: negative Gastrointestinal: positive for diarrhea Genitourinary:negative Integument/breast: negative Hematologic/lymphatic: negative Musculoskeletal:positive for arthralgias Neurological: negative Behavioral/Psych: negative Endocrine: negative Allergic/Immunologic: negative   PHYSICAL EXAMINATION: General appearance: alert, cooperative, fatigued, and no distress Head: Normocephalic, without obvious abnormality, atraumatic Neck: no adenopathy, no JVD, supple, symmetrical, trachea midline, and thyroid not enlarged, symmetric, no tenderness/mass/nodules Lymph nodes: Cervical, supraclavicular, and axillary nodes normal. Resp: clear to auscultation bilaterally Back: symmetric, no curvature. ROM normal. No CVA tenderness. Cardio: regular rate and rhythm, S1, S2 normal, no murmur, click, rub or gallop GI: soft, non-tender; bowel sounds normal; no masses,  no organomegaly Extremities: extremities normal,  atraumatic, no cyanosis or edema Neurologic: Alert and oriented X 3, normal strength and tone. Normal symmetric reflexes. Normal coordination and gait  ECOG PERFORMANCE STATUS: 1 - Symptomatic but completely ambulatory  Blood pressure 110/73, pulse (!) 101, temperature 97.8 F (36.6 C), temperature source Tympanic, resp. rate 17, height 5' 5.5" (1.664 m), weight 167 lb 4.8 oz (75.9 kg), last menstrual period 08/21/2002, SpO2 96 %.  LABORATORY DATA: Lab Results  Component Value Date   WBC 5.2 02/24/2021   HGB 12.5 02/24/2021   HCT 37.4 02/24/2021   MCV 95.2 02/24/2021   PLT 97 (L) 02/24/2021      Chemistry      Component Value Date/Time   NA 140 02/24/2021 1043   K 4.5 02/24/2021 1043   CL 106 02/24/2021 1043   CO2 27 02/24/2021 1043   BUN 17 02/24/2021 1043   CREATININE 0.89 02/24/2021 1043      Component Value Date/Time   CALCIUM 9.6 02/24/2021 1043   ALKPHOS 82 02/24/2021 1043   AST 18 02/24/2021 1043   ALT 18 02/24/2021 1043   BILITOT 0.6 02/24/2021 1043       RADIOGRAPHIC STUDIES: No results found.  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 The patient completed a course of concurrent chemoradiation with weekly carboplatin and paclitaxel status post 7 cycles.  She tolerated her treatment well except for fatigue as well as a skin burn and cough. Her scan showed mild improvement of the left upper lobe lung mass as well as the mediastinal lymphadenopathy.  The patient continues to have small left pleural effusion that is suspicious given her presentation. She is currently on treatment with Tagrisso 80 mg p.o. daily started  on January 31, 2020.  She is status post 14.5 months of treatment and has been tolerating her treatment well except for occasional diarrhea. The patient has no additional complaints since her last visit except for the fatigue and mild depression that comes and goes. I had a lengthy discussion with the patient today about her condition. I will give the patient information about the lung cancer support group to join and have a peer support. I will also refer the patient to psychological evaluation and consultation.  I will see her back for follow-up visit in 6 weeks for evaluation and repeat CT scan of the chest for restaging of her disease. She was advised to call immediately if she has any other concerning symptoms in the interval. The patient voices understanding of current disease status and treatment options and is in agreement with the current care plan.  All questions were answered. The patient knows to call the clinic with any problems, questions or concerns. We can certainly see the patient much sooner if necessary.  Disclaimer: This note was dictated with voice recognition software. Similar sounding words can inadvertently be transcribed and may not be corrected upon review.

## 2021-05-09 ENCOUNTER — Other Ambulatory Visit (HOSPITAL_COMMUNITY): Payer: Self-pay

## 2021-05-11 ENCOUNTER — Other Ambulatory Visit (HOSPITAL_COMMUNITY): Payer: Self-pay

## 2021-05-15 ENCOUNTER — Other Ambulatory Visit (HOSPITAL_COMMUNITY): Payer: Self-pay

## 2021-05-17 ENCOUNTER — Other Ambulatory Visit (HOSPITAL_COMMUNITY): Payer: Self-pay

## 2021-05-19 ENCOUNTER — Other Ambulatory Visit (HOSPITAL_COMMUNITY): Payer: Self-pay

## 2021-06-02 ENCOUNTER — Other Ambulatory Visit (HOSPITAL_COMMUNITY): Payer: Self-pay

## 2021-06-06 ENCOUNTER — Inpatient Hospital Stay: Payer: Medicare Other | Attending: Internal Medicine

## 2021-06-06 ENCOUNTER — Encounter (HOSPITAL_COMMUNITY): Payer: Self-pay

## 2021-06-06 ENCOUNTER — Ambulatory Visit (HOSPITAL_COMMUNITY)
Admission: RE | Admit: 2021-06-06 | Discharge: 2021-06-06 | Disposition: A | Discharge status: Home / Self Care | Payer: Medicare Other | Source: Ambulatory Visit | Attending: Internal Medicine | Admitting: Internal Medicine

## 2021-06-06 ENCOUNTER — Other Ambulatory Visit: Payer: Self-pay

## 2021-06-06 ENCOUNTER — Other Ambulatory Visit (HOSPITAL_COMMUNITY): Payer: Self-pay

## 2021-06-06 DIAGNOSIS — C3490 Malignant neoplasm of unspecified part of unspecified bronchus or lung: Secondary | ICD-10-CM

## 2021-06-06 DIAGNOSIS — K449 Diaphragmatic hernia without obstruction or gangrene: Secondary | ICD-10-CM | POA: Diagnosis not present

## 2021-06-06 DIAGNOSIS — I2510 Atherosclerotic heart disease of native coronary artery without angina pectoris: Secondary | ICD-10-CM | POA: Diagnosis not present

## 2021-06-06 DIAGNOSIS — Z85118 Personal history of other malignant neoplasm of bronchus and lung: Secondary | ICD-10-CM | POA: Diagnosis not present

## 2021-06-06 DIAGNOSIS — J9811 Atelectasis: Secondary | ICD-10-CM | POA: Diagnosis not present

## 2021-06-06 LAB — CBC WITH DIFFERENTIAL (CANCER CENTER ONLY)
Abs Immature Granulocytes: 0.01 10*3/uL (ref 0.00–0.07)
Basophils Absolute: 0 10*3/uL (ref 0.0–0.1)
Basophils Relative: 0 %
Eosinophils Absolute: 0.2 10*3/uL (ref 0.0–0.5)
Eosinophils Relative: 4 %
HCT: 36.1 % (ref 36.0–46.0)
Hemoglobin: 12.2 g/dL (ref 12.0–15.0)
Immature Granulocytes: 0 %
Lymphocytes Relative: 23 %
Lymphs Abs: 1 10*3/uL (ref 0.7–4.0)
MCH: 31.9 pg (ref 26.0–34.0)
MCHC: 33.8 g/dL (ref 30.0–36.0)
MCV: 94.3 fL (ref 80.0–100.0)
Monocytes Absolute: 0.5 10*3/uL (ref 0.1–1.0)
Monocytes Relative: 12 %
Neutro Abs: 2.6 10*3/uL (ref 1.7–7.7)
Neutrophils Relative %: 61 %
Platelet Count: 103 10*3/uL — ABNORMAL LOW (ref 150–400)
RBC: 3.83 MIL/uL — ABNORMAL LOW (ref 3.87–5.11)
RDW: 12.7 % (ref 11.5–15.5)
WBC Count: 4.3 10*3/uL (ref 4.0–10.5)
nRBC: 0 % (ref 0.0–0.2)

## 2021-06-06 LAB — CMP (CANCER CENTER ONLY)
ALT: 14 U/L (ref 0–44)
AST: 15 U/L (ref 15–41)
Albumin: 4.2 g/dL (ref 3.5–5.0)
Alkaline Phosphatase: 79 U/L (ref 38–126)
Anion gap: 6 (ref 5–15)
BUN: 16 mg/dL (ref 8–23)
CO2: 28 mmol/L (ref 22–32)
Calcium: 9.4 mg/dL (ref 8.9–10.3)
Chloride: 105 mmol/L (ref 98–111)
Creatinine: 1.06 mg/dL — ABNORMAL HIGH (ref 0.44–1.00)
GFR, Estimated: 57 mL/min — ABNORMAL LOW (ref >60)
Glucose, Bld: 106 mg/dL — ABNORMAL HIGH (ref 70–99)
Potassium: 4.3 mmol/L (ref 3.5–5.1)
Sodium: 139 mmol/L (ref 135–145)
Total Bilirubin: 0.4 mg/dL (ref 0.3–1.2)
Total Protein: 6.7 g/dL (ref 6.5–8.1)

## 2021-06-06 MED ORDER — IOHEXOL 300 MG/ML  SOLN
75.0000 mL | Freq: Once | INTRAMUSCULAR | Status: AC | PRN
Start: 2021-06-06 — End: 2021-06-06
  Administered 2021-06-06: 75 mL via INTRAVENOUS

## 2021-06-06 MED ORDER — SODIUM CHLORIDE (PF) 0.9 % IJ SOLN
INTRAMUSCULAR | Status: AC
  Filled 2021-06-06: qty 50

## 2021-06-08 ENCOUNTER — Other Ambulatory Visit (HOSPITAL_COMMUNITY): Payer: Self-pay

## 2021-06-13 ENCOUNTER — Other Ambulatory Visit (HOSPITAL_COMMUNITY): Payer: Self-pay

## 2021-06-13 ENCOUNTER — Other Ambulatory Visit: Payer: Self-pay

## 2021-06-13 ENCOUNTER — Inpatient Hospital Stay: Payer: Medicare Other | Admitting: Internal Medicine

## 2021-06-13 VITALS — BP 128/78 | HR 71 | Temp 97.8°F | Resp 18 | Wt 166.4 lb

## 2021-06-13 DIAGNOSIS — Z5111 Encounter for antineoplastic chemotherapy: Secondary | ICD-10-CM | POA: Diagnosis not present

## 2021-06-13 DIAGNOSIS — Z9221 Personal history of antineoplastic chemotherapy: Secondary | ICD-10-CM | POA: Insufficient documentation

## 2021-06-13 DIAGNOSIS — C3412 Malignant neoplasm of upper lobe, left bronchus or lung: Secondary | ICD-10-CM | POA: Insufficient documentation

## 2021-06-13 DIAGNOSIS — L821 Other seborrheic keratosis: Secondary | ICD-10-CM | POA: Diagnosis not present

## 2021-06-13 DIAGNOSIS — D225 Melanocytic nevi of trunk: Secondary | ICD-10-CM | POA: Diagnosis not present

## 2021-06-13 DIAGNOSIS — L738 Other specified follicular disorders: Secondary | ICD-10-CM | POA: Diagnosis not present

## 2021-06-13 DIAGNOSIS — Z79899 Other long term (current) drug therapy: Secondary | ICD-10-CM | POA: Insufficient documentation

## 2021-06-13 DIAGNOSIS — C3492 Malignant neoplasm of unspecified part of left bronchus or lung: Secondary | ICD-10-CM | POA: Diagnosis not present

## 2021-06-13 DIAGNOSIS — Z923 Personal history of irradiation: Secondary | ICD-10-CM | POA: Insufficient documentation

## 2021-06-13 NOTE — Progress Notes (Signed)
?    [facility] ?[phone]   [phone] ? ?OFFICE PROGRESS NOTE ? ?[name], MD ?[address] ?[place] [address] ? ?DIAGNOSIS: Stage IIIc (T3, N3, M0) non-small cell lung cancer, adenocarcinoma presented with large left upper lobe lung mass in addition to left infrahilar and right hilar and subcarinal lymphadenopathy diagnosed in August 2021. ?  ?Molecular Biomarkers: Insufficient tissue for foundation 1. Guardant 360 results. ? ?DETECTED ALTERATION(S) / BIOMARKER(S) % CFDNA OR AMPLIFICATION ASSOCIATED FDA-APPROVED THERAPIES CLINICAL TRIAL AVAILABILITY ? ?EGFR L858R, 1.2%, Afatinib, Dacomitinib, Erlotinib, Gefitinib, Osimertinib, Ramucirumab ?Yes ?[id number], 1.0%, Afatinib, Dacomitinib, Erlotinib, Gefitinib, Osimertinib, Ramucirumab ?Yes ?[id number] ?1.0% ?None  ?No ?  ?PRIOR THERAPY: Weekly concurrent chemoradiation with carboplatin for an AUC of 2 and paclitaxel 45 mg/m2.  First dose expected on 11/16/2019. Status post 7 cycles. ?  ?CURRENT THERAPY: Tagrisso 80 mg p.o. daily. First dose started 01/31/2020.  Status post 16 months of treatment. ? ?INTERVAL HISTORY: ?Keidra Withers 71 y.o. female returns to the clinic today for follow-up visit.  The patient is feeling fine today with no concerning complaints except for mild fatigue as well as cough and shortness of breath with exertion.  She has no chest pain or hemoptysis.  She has no nausea, vomiting, diarrhea or constipation.  She has no headache or visual changes.  She has no recent weight loss or night sweats.  She has been tolerating her treatment with Tagrisso fairly well.  The patient is here today for evaluation with repeat CT scan of the chest for restaging of her disease. ? ? ?MEDICAL HISTORY: ?Past Medical History:  ?Diagnosis Date  ? Anemia   ? as a child  ? Anxiety   ? Asthma 10/19/2020  ? Bursitis   ? Chronic reflux esophagitis   ? Complication of anesthesia   ? Diarrhea, functional   ?  Diverticulosis   ? GERD (gastroesophageal reflux disease)   ? History of kidney stones   ? Hypertension   ? Knee pain   ? right knee-seeing ortho  ? lung ca 09/2019  ? Osteoarthritis   ? Plantar fasciitis   ? PONV (postoperative nausea and vomiting)   ? has used the patch before and that helps  ? Pure hypercholesterolemia   ? RLS (restless legs syndrome)   ? Superficial vein thrombosis   ? Thyroid disease   ? hypothyroid  ? ? ?ALLERGIES:  has No Known Allergies. ? ?MEDICATIONS:  ?Current Outpatient Medications  ?Medication Sig Dispense Refill  ? albuterol (VENTOLIN HFA) 108 (90 Base) MCG/ACT inhaler Inhale 2 puffs into the lungs every 6 (six) hours as needed for wheezing or shortness of breath. 8.5 g 0  ? cholecalciferol (VITAMIN D3) 25 MCG (1000 UNIT) tablet Take 1,000 Units by mouth daily. Pt states she take 2000 units/day    ? cyclobenzaprine (FLEXERIL) 10 MG tablet Take 10 mg by mouth 3 (three) times daily.    ? famotidine (PEPCID) 40 MG tablet Take 40 mg by mouth See admin instructions. 4 times a week    ? ferrous sulfate 325 (65 FE) MG tablet Take 325 mg by mouth daily with breakfast.    ? fexofenadine (ALLEGRA) 180 MG tablet Take 180 mg by mouth daily.    ? fluticasone (FLONASE) 50 MCG/ACT nasal spray Place 2 sprays into both nostrils daily.    ? HYDROcodone-acetaminophen (NORCO/VICODIN) 5-325 MG tablet Take 0.5 tablets by mouth daily as needed (Knee/Back pain).    ? HYDROcodone-homatropine (HYCODAN) 5-1.5  MG/5ML syrup Take 5 mLs by mouth daily as needed for cough.    ? lidocaine (LIDODERM) 5 % 1 patch daily.    ? loperamide (IMODIUM) 2 MG capsule Take 2-4 mg by mouth every 6 (six) hours as needed.    ? LORazepam (ATIVAN) 0.5 MG tablet Take 0.5 mg by mouth at bedtime.     ? magic mouthwash SOLN 5 to 10 ml swish and spit or swallow QID prn mouth and esophageal pain 240 mL 2  ? Magnesium-Potassium 250-100 MG TABS Take 1 tablet by mouth daily.    ? melatonin 5 MG TABS Take 5 mg by mouth at bedtime.    ?  metoprolol succinate (TOPROL XL) 50 MG 24 hr tablet Take 50 mg by mouth daily.     ? MYRBETRIQ 50 MG TB24 tablet Take 50 mg by mouth daily.    ? osimertinib mesylate (TAGRISSO) 80 MG tablet TAKE 1 TABLET BY MOUTH DAILY. 30 tablet 2  ? oxyCODONE-acetaminophen (PERCOCET/ROXICET) 5-325 MG tablet Take 1 tablet by mouth every 4 (four) hours as needed for severe pain. 45 tablet 0  ? oxymetazoline (AFRIN) 0.05 % nasal spray Place 1 spray into both nostrils daily as needed for congestion.    ? phenazopyridine (PYRIDIUM) 200 MG tablet Take 200 mg by mouth daily as needed.    ? RABEprazole (ACIPHEX) 20 MG tablet Take 1 tablet (20 mg total) by mouth 2 (two) times a week.    ? rosuvastatin (CRESTOR) 5 MG tablet Take 5 mg by mouth daily.    ? SYNTHROID 112 MCG tablet Take 112 mcg by mouth daily before breakfast.     ? vitamin B-12 (CYANOCOBALAMIN) 1000 MCG tablet Take 1,000 mcg by mouth daily.    ? ?No current facility-administered medications for this visit.  ? ? ?SURGICAL HISTORY:  ?Past Surgical History:  ?Procedure Laterality Date  ? APPENDECTOMY    ? BRONCHIAL BIOPSY  10/20/2019  ? Procedure: BRONCHIAL BIOPSIES;  Surgeon: Collene Gobble, MD;  Location: [facility] ENDOSCOPY;  Service: Pulmonary;;  ? BRONCHIAL BRUSHINGS  10/20/2019  ? Procedure: BRONCHIAL BRUSHINGS;  Surgeon: Collene Gobble, MD;  Location: [facility] ENDOSCOPY;  Service: Pulmonary;;  ? BRONCHIAL NEEDLE ASPIRATION BIOPSY  10/20/2019  ? Procedure: BRONCHIAL NEEDLE ASPIRATION BIOPSIES;  Surgeon: Collene Gobble, MD;  Location: [facility] ENDOSCOPY;  Service: Pulmonary;;  ? BRONCHIAL WASHINGS  10/20/2019  ? Procedure: BRONCHIAL WASHINGS;  Surgeon: Collene Gobble, MD;  Location: [facility] ENDOSCOPY;  Service: Pulmonary;;  ? CARPAL TUNNEL RELEASE Right 2011  ? Dr. Amedeo Plenty  ? COLPORRHAPHY    ? posterior  ? HAND SURGERY Right 2016  ? Nerve surgery, Dr. Amedeo Plenty  ? OVARIAN CYST REMOVAL    ? RECTOCELE REPAIR  2011  ? w/TVH and sling  ? TONSILLECTOMY    ? TONSILLECTOMY    ? TOTAL KNEE ARTHROPLASTY Left  09/09/2018  ? Procedure: TOTAL KNEE ARTHROPLASTY, CORTISONE INJECTION RIGHT KNEE;  Surgeon: Paralee Cancel, MD;  Location: [facility] ORS;  Service: Orthopedics;  Laterality: Left;  70 mins  ? TOTAL VAGINAL HYSTERECTOMY  10/18/2009  ? rectocele repair, sling  ? TUBAL LIGATION Bilateral   ? VARICOSE VEIN SURGERY    ? VIDEO BRONCHOSCOPY WITH ENDOBRONCHIAL NAVIGATION N/A 10/20/2019  ? Procedure: VIDEO BRONCHOSCOPY WITH ENDOBRONCHIAL NAVIGATION;  Surgeon: Collene Gobble, MD;  Location: [facility] ENDOSCOPY;  Service: Pulmonary;  Laterality: N/A;  ? ? ?REVIEW OF SYSTEMS:  Constitutional: positive for fatigue ?Eyes: negative ?Ears, nose, mouth, throat, and face: negative ?Respiratory:  positive for cough and dyspnea on exertion ?Cardiovascular: negative ?Gastrointestinal: negative ?Genitourinary:negative ?Integument/breast: negative ?Hematologic/lymphatic: negative ?Musculoskeletal:negative ?Neurological: negative ?Behavioral/Psych: negative ?Endocrine: negative ?Allergic/Immunologic: negative  ? ?PHYSICAL EXAMINATION: General appearance: alert, cooperative, fatigued, and no distress ?Head: Normocephalic, without obvious abnormality, atraumatic ?Neck: no adenopathy, no JVD, supple, symmetrical, trachea midline, and thyroid not enlarged, symmetric, no tenderness/mass/nodules ?Lymph nodes: Cervical, supraclavicular, and axillary nodes normal. ?Resp: clear to auscultation bilaterally ?Back: symmetric, no curvature. ROM normal. No CVA tenderness. ?Cardio: regular rate and rhythm, S1, S2 normal, no murmur, click, rub or gallop ?GI: soft, non-tender; bowel sounds normal; no masses,  no organomegaly ?Extremities: extremities normal, atraumatic, no cyanosis or edema ?Neurologic: Alert and oriented X 3, normal strength and tone. Normal symmetric reflexes. Normal coordination and gait ? ?ECOG PERFORMANCE STATUS: 1 - Symptomatic but completely ambulatory ? ?Blood pressure 128/78, pulse 71, temperature 97.8 ?F (36.6 ?C), temperature source Oral, resp.  rate 18, weight 166 lb 6 oz (75.5 kg), last menstrual period 08/21/2002, SpO2 100 %. ? ?LABORATORY DATA: ?Lab Results  ?Component Value Date  ? WBC 4.3 06/06/2021  ? HGB 12.2 06/06/2021  ? HCT 36.1 06/06/2021  ? MCV 94.3

## 2021-06-14 ENCOUNTER — Other Ambulatory Visit: Payer: Self-pay | Admitting: Family Medicine

## 2021-06-14 DIAGNOSIS — Z1231 Encounter for screening mammogram for malignant neoplasm of breast: Secondary | ICD-10-CM

## 2021-06-15 ENCOUNTER — Other Ambulatory Visit (HOSPITAL_COMMUNITY): Payer: Self-pay

## 2021-06-19 ENCOUNTER — Other Ambulatory Visit (HOSPITAL_COMMUNITY): Payer: Self-pay

## 2021-06-21 ENCOUNTER — Other Ambulatory Visit (HOSPITAL_COMMUNITY): Payer: Self-pay

## 2021-06-29 ENCOUNTER — Encounter: Payer: Self-pay | Admitting: Interventional Cardiology

## 2021-06-29 ENCOUNTER — Ambulatory Visit: Payer: Medicare Other | Admitting: Interventional Cardiology

## 2021-06-29 VITALS — BP 114/72 | HR 87 | Ht 65.5 in | Wt 166.0 lb

## 2021-06-29 DIAGNOSIS — R0602 Shortness of breath: Secondary | ICD-10-CM

## 2021-06-29 DIAGNOSIS — I2584 Coronary atherosclerosis due to calcified coronary lesion: Secondary | ICD-10-CM

## 2021-06-29 DIAGNOSIS — I700 Atherosclerosis of aorta: Secondary | ICD-10-CM

## 2021-06-29 DIAGNOSIS — E782 Mixed hyperlipidemia: Secondary | ICD-10-CM | POA: Diagnosis not present

## 2021-06-29 DIAGNOSIS — I2510 Atherosclerotic heart disease of native coronary artery without angina pectoris: Secondary | ICD-10-CM | POA: Diagnosis not present

## 2021-06-29 NOTE — Progress Notes (Signed)
?  ?Cardiology Office Note ? ? ?Date:  06/29/2021  ? ?ID:  Linda Woods, DOB 1950-05-02, MRN [id number] ? ?PCP:  [name], MD  ? ? ?No chief complaint on file. ? ?Coronary artery calcification ? ?Wt Readings from Last 3 Encounters:  ?06/29/21 166 lb (75.3 kg)  ?06/13/21 166 lb 6 oz (75.5 kg)  ?04/27/21 167 lb 4.8 oz (75.9 kg)  ?  ? ?  ?History of Present Illness: ?Linda Woods is a 71 y.o. female who is being seen today for the evaluation of coronary artery calcification at the request of Curt Bears, MD.  ? ?Records from oncology show: "Stage IIIc (T3, N3, M0) non-small cell lung cancer, adenocarcinoma presented with large left upper lobe lung mass in addition to left infrahilar and right hilar and subcarinal lymphadenopathy diagnosed in August 2021." ? ?"For the coronary artery disease documented on the CT scan of the chest, the patient was seen by cardiology in the past but not in the last few years.  I will make a referral for her to see a cardiologist with [facility]." ? ?She was seen in [facility] for cardiology care back in 2016.  High triglycerides were noted. ? ?2023 chest CT showed: "Cardiovascular: Heart size is normal. There is no significant pericardial fluid, thickening or pericardial calcification. There is aortic atherosclerosis, as well as atherosclerosis of the great vessels of the mediastinum and the coronary arteries, including calcified atherosclerotic plaque in the right coronary artery." ? ?SHe has some SHOB when she leans over and has some fatigue.  Has some difficulty with inclines.  ? ?Denies : Chest pain. Dizziness. Leg edema. Nitroglycerin use. Orthopnea. Palpitations. Paroxysmal nocturnal dyspnea. Syncope.   ? ?DOE limits her.   ? 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 ? ?Past Medical History:  ?Diagnosis Date  ? Anemia   ? as a child  ? Anxiety   ? Asthma 10/19/2020  ? Bursitis   ? Chronic reflux esophagitis   ? Complication of anesthesia   ?  Diarrhea, functional   ? Diverticulosis   ? GERD (gastroesophageal reflux disease)   ? History of kidney stones   ? Hypertension   ? Knee pain   ? right knee-seeing ortho  ? lung ca 09/2019  ? Osteoarthritis   ? Plantar fasciitis   ? PONV (postoperative nausea and vomiting)   ? has used the patch before and that helps  ? Pure hypercholesterolemia   ? RLS (restless legs syndrome)   ? Superficial vein thrombosis   ? Thyroid disease   ? hypothyroid  ? ? ?Past Surgical History:  ?Procedure Laterality Date  ? APPENDECTOMY    ? BRONCHIAL BIOPSY  10/20/2019  ? Procedure: BRONCHIAL BIOPSIES;  Surgeon: Collene Gobble, MD;  Location: [facility] ENDOSCOPY;  Service: Pulmonary;;  ? BRONCHIAL BRUSHINGS  10/20/2019  ? Procedure: BRONCHIAL BRUSHINGS;  Surgeon: Collene Gobble, MD;  Location: [facility] ENDOSCOPY;  Service: Pulmonary;;  ? BRONCHIAL NEEDLE ASPIRATION BIOPSY  10/20/2019  ? Procedure: BRONCHIAL NEEDLE ASPIRATION BIOPSIES;  Surgeon: Collene Gobble, MD;  Location: [facility] ENDOSCOPY;  Service: Pulmonary;;  ? BRONCHIAL WASHINGS  10/20/2019  ? Procedure: BRONCHIAL WASHINGS;  Surgeon: Collene Gobble, MD;  Location: [facility] ENDOSCOPY;  Service: Pulmonary;;  ? CARPAL TUNNEL RELEASE Right 2011  ? Dr. Amedeo Plenty  ? COLPORRHAPHY    ? posterior  ? HAND SURGERY Right 2016  ? Nerve surgery, Dr. Amedeo Plenty  ? OVARIAN CYST REMOVAL    ? RECTOCELE REPAIR  2011  ? w/TVH and sling  ? TONSILLECTOMY    ? TONSILLECTOMY    ? TOTAL KNEE ARTHROPLASTY Left 09/09/2018  ? Procedure: TOTAL KNEE ARTHROPLASTY, CORTISONE INJECTION RIGHT KNEE;  Surgeon: Paralee Cancel, MD;  Location: [facility] ORS;  Service: Orthopedics;  Laterality: Left;  70 mins  ? TOTAL VAGINAL HYSTERECTOMY  10/18/2009  ? rectocele repair, sling  ? TUBAL LIGATION Bilateral   ? VARICOSE VEIN SURGERY    ? VIDEO BRONCHOSCOPY WITH ENDOBRONCHIAL NAVIGATION N/A 10/20/2019  ? Procedure: VIDEO BRONCHOSCOPY WITH ENDOBRONCHIAL NAVIGATION;  Surgeon: Collene Gobble, MD;  Location: [facility] ENDOSCOPY;  Service: Pulmonary;  Laterality: N/A;   ? ? ? ?Current Outpatient Medications  ?Medication Sig Dispense Refill  ? albuterol (VENTOLIN HFA) 108 (90 Base) MCG/ACT inhaler Inhale 2 puffs into the lungs every 6 (six) hours as needed for wheezing or shortness of breath. 8.5 g 0  ? cholecalciferol (VITAMIN D3) 25 MCG (1000 UNIT) tablet Take 1,000 Units by mouth daily. Pt states she take 2000 units/day    ? cyclobenzaprine (FLEXERIL) 10 MG tablet Take 10 mg by mouth 3 (three) times daily.    ? famotidine (PEPCID) 40 MG tablet Take 40 mg by mouth See admin instructions. 4 times a week    ? ferrous sulfate 325 (65 FE) MG tablet Take 325 mg by mouth daily with breakfast.    ? fexofenadine (ALLEGRA) 180 MG tablet Take 180 mg by mouth daily.    ? fluticasone (FLONASE) 50 MCG/ACT nasal spray Place 2 sprays into both nostrils daily.    ? HYDROcodone-acetaminophen (NORCO/VICODIN) 5-325 MG tablet Take 0.5 tablets by mouth daily as needed (Knee/Back pain).    ? HYDROcodone-homatropine (HYCODAN) 5-1.5 MG/5ML syrup Take 5 mLs by mouth daily as needed for cough.    ? lidocaine (LIDODERM) 5 % 1 patch daily.    ? loperamide (IMODIUM) 2 MG capsule Take 2-4 mg by mouth every 6 (six) hours as needed.    ? LORazepam (ATIVAN) 0.5 MG tablet Take 0.5 mg by mouth at bedtime.     ? magic mouthwash SOLN 5 to 10 ml swish and spit or swallow QID prn mouth and esophageal pain 240 mL 2  ? Magnesium-Potassium 250-100 MG TABS Take 1 tablet by mouth daily.    ? melatonin 5 MG TABS Take 5 mg by mouth at bedtime.    ? metoprolol succinate (TOPROL-XL) 50 MG 24 hr tablet Take 50 mg by mouth daily.     ? MYRBETRIQ 50 MG TB24 tablet Take 50 mg by mouth daily.    ? osimertinib mesylate (TAGRISSO) 80 MG tablet TAKE 1 TABLET BY MOUTH DAILY. 30 tablet 2  ? oxyCODONE-acetaminophen (PERCOCET/ROXICET) 5-325 MG tablet Take 1 tablet by mouth every 4 (four) hours as needed for severe pain. 45 tablet 0  ? oxymetazoline (AFRIN) 0.05 % nasal spray Place 1 spray into both nostrils daily as needed for congestion.     ? phenazopyridine (PYRIDIUM) 200 MG tablet Take 200 mg by mouth daily as needed.    ? RABEprazole (ACIPHEX) 20 MG tablet Take 1 tablet (20 mg total) by mouth 2 (two) times a week.    ? rosuvastatin (CRESTOR) 5 MG tablet Take 5 mg by mouth daily.    ? SYNTHROID 112 MCG tablet Take 112 mcg by mouth daily before breakfast.     ? vitamin B-12 (CYANOCOBALAMIN) 1000 MCG tablet Take 1,000 mcg by mouth daily.    ? ?No current facility-administered medications for this visit.  ? ? ?  Allergies:   Patient has no known allergies.  ? ? ?Social History:  The patient  reports that she has never smoked. She has never used smokeless tobacco. She reports current alcohol use of about 2.0 - 3.0 standard drinks per week. She reports that she does not use drugs.  ? ?Family History:  The patient's family history includes Breast cancer in her maternal aunt and paternal aunt; Cancer in her brother; Cervical cancer in her mother; Diabetes in her father; Heart failure in her father and mother.  ? ? ?ROS:  Please see the history of present illness.   Otherwise, review of systems are positive for DOE.   All other systems are reviewed and negative.  ? ? ?PHYSICAL EXAM: ?VS:  BP 114/72   Pulse 87   Ht 5' 5.5" (1.664 m)   Wt 166 lb (75.3 kg)   LMP 08/21/2002   SpO2 97%   BMI 27.20 kg/m?  , BMI Body mass index is 27.2 kg/m?. ?GEN: Well nourished, well developed, in no acute distress ?HEENT: normal ?Neck: no JVD, carotid bruits, or masses ?Cardiac: RRR; no murmurs, rubs, or gallops,no edema  ?Respiratory:  clear to auscultation bilaterally, normal work of breathing ?GI: soft, nontender, nondistended, + BS ?MS: no deformity or atrophy ?Skin: warm and dry, no rash ?Neuro:  Strength and sensation are intact ?Psych: euthymic mood, full affect ? ? ?EKG:   ?The ekg ordered today demonstrates normal sinus rhythm, nonspecific ST T wave changes ? ? ?Recent Labs: ?06/06/2021: ALT 14; BUN 16; Creatinine 1.06; Hemoglobin 12.2; Platelet Count 103;  Potassium 4.3; Sodium 139  ? ?Lipid Panel ?No results found for: CHOL, TRIG, HDL, CHOLHDL, VLDL, LDLCALC, LDLDIRECT ?  ?Other studies Reviewed: ?Additional studies/ records that were reviewed today with results demonstr

## 2021-06-29 NOTE — Patient Instructions (Signed)
Medication Instructions:  ?Your physician recommends that you continue on your current medications as directed. Please refer to the Current Medication list given to you today. ? ?*If you need a refill on your cardiac medications before your next appointment, please call your pharmacy* ? ? ?Lab Work: ?none ?If you have labs (blood work) drawn today and your tests are completely normal, you will receive your results only by: ?MyChart Message (if you have MyChart) OR ?A paper copy in the mail ?If you have any lab test that is abnormal or we need to change your treatment, we will call you to review the results. ? ? ?Testing/Procedures: ?Your physician has requested that you have an echocardiogram. Echocardiography is a painless test that uses sound waves to create images of your heart. It provides your doctor with information about the size and shape of your heart and how well your heart?s chambers and valves are working. This procedure takes approximately one hour. There are no restrictions for this procedure. ? ? ? ?Follow-Up: ?At [facility], you and your health needs are our priority.  As part of our continuing mission to provide you with exceptional heart care, we have created designated Provider Care Teams.  These Care Teams include your primary Cardiologist (physician) and Advanced Practice Providers (APPs -  Physician Assistants and Nurse Practitioners) who all work together to provide you with the care you need, when you need it. ? ?We recommend signing up for the patient portal called "MyChart".  Sign up information is provided on this After Visit Summary.  MyChart is used to connect with patients for Virtual Visits (Telemedicine).  Patients are able to view lab/test results, encounter notes, upcoming appointments, etc.  Non-urgent messages can be sent to your provider as well.   ?To learn more about what you can do with MyChart, go to [email or web address].   ? ?Your next appointment:   ?6  month(s) ? ?The format for your next appointment:   ?In Person ? ?Provider:   ?Larae Grooms, MD   ? ? ?Other Instructions ? ? ?Important Information About Sugar ? ? ? ? ? ? ?

## 2021-07-03 IMAGING — DX DG CHEST 1V PORT
1 series · 1 of 1 positions shown · non-contrast
Comparison: Multiple prior chest radiographs. Including study from
10/21/2019 is most recent prior

CLINICAL DATA: LEFT-sided pneumothorax

EXAM:
PORTABLE CHEST 1 VIEW

[chest]
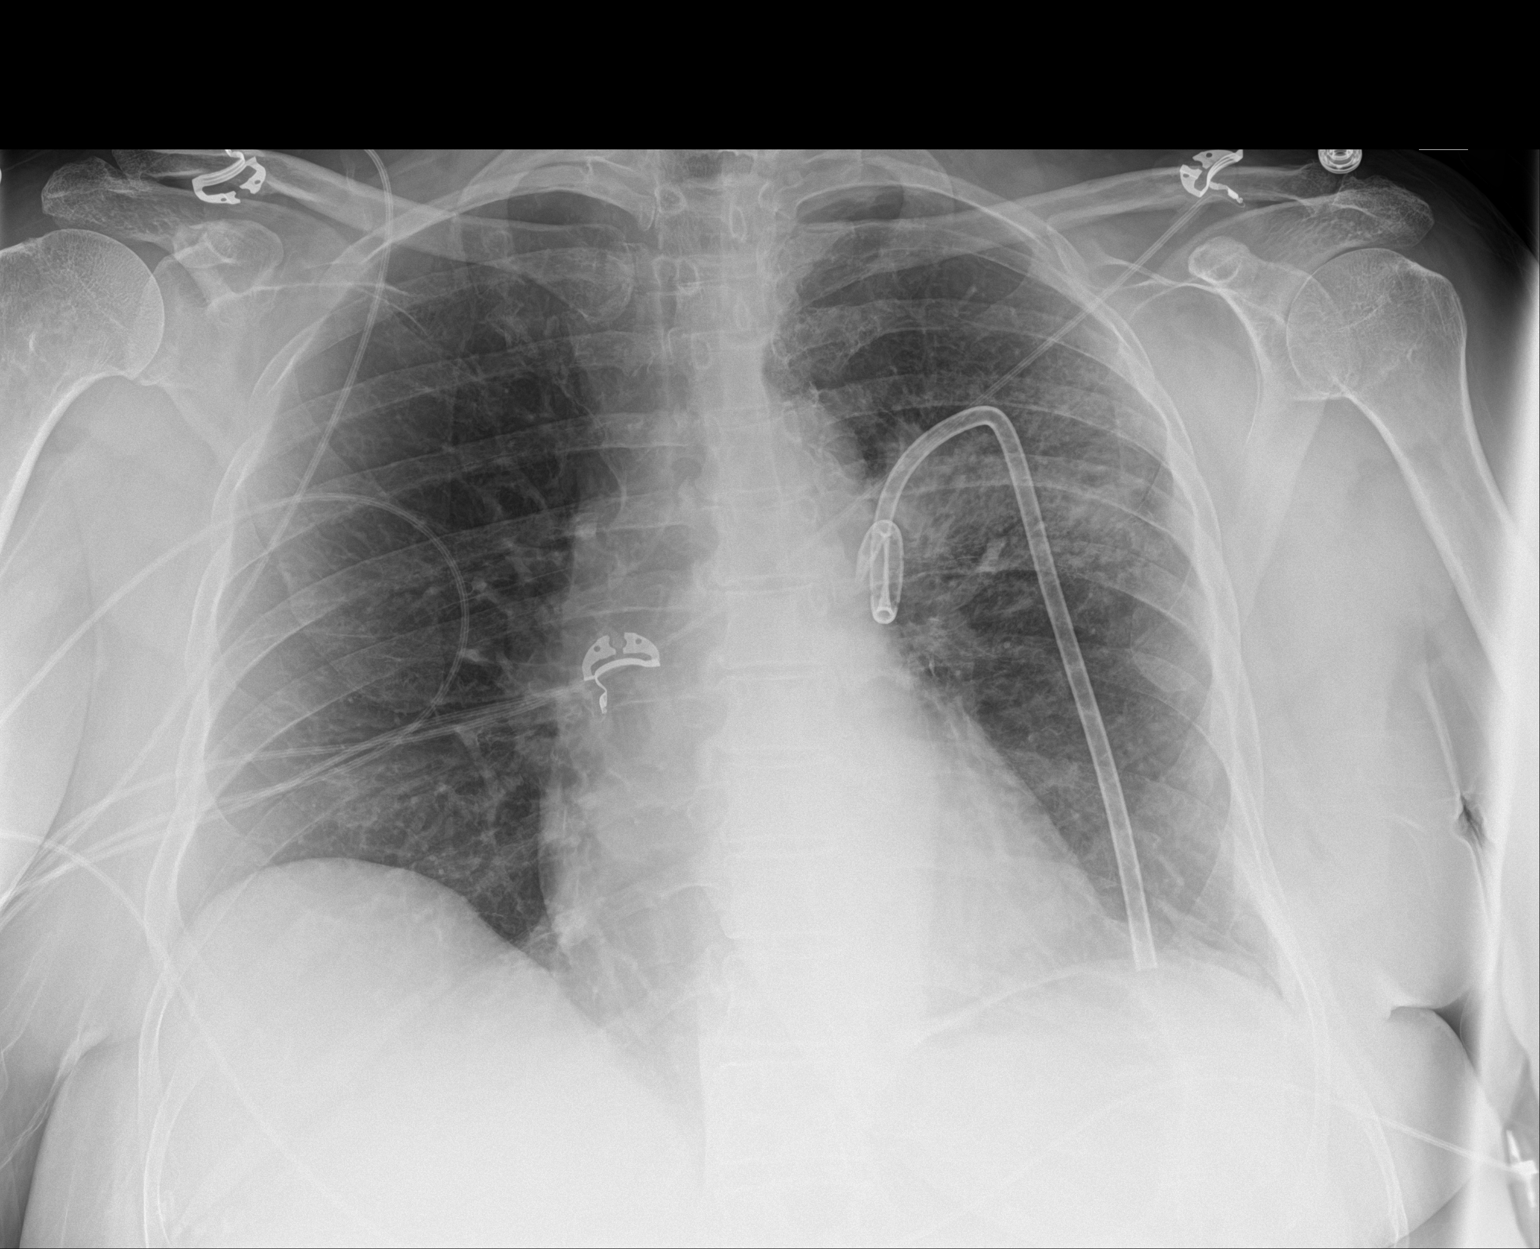

[1 of 1 positions shown; findings below may reference images not displayed]

FINDINGS: Slight rotation to the RIGHT on current exam.

Accounting for this cardiomediastinal contours are stable. LEFT
upper lobe mass as before.

The tiny LEFT apical pneumothorax that was seen on the previous exam
is not currently seen. There may be a very small lateral component
adjacent to the mass and fissural distortion in the lateral LEFT
chest.

On limited assessment skeletal structures without acute process.
LEFT-sided chest tube likely retracted slightly since the previous
exam based on appearance.
IMPRESSION: LEFT-sided chest tube perhaps retracted slightly since previous
imaging. Possible tiny pneumothorax remaining laterally adjacent to
fissural distortion along the lateral margin of the known mass.

## 2021-07-03 IMAGING — DX DG CHEST 1V PORT
1 series · 1 of 1 positions shown · non-contrast
Comparison: 10/22/2019

CLINICAL DATA: Pneumothorax.  Chest tube

EXAM:
PORTABLE CHEST 1 VIEW

[chest ap]
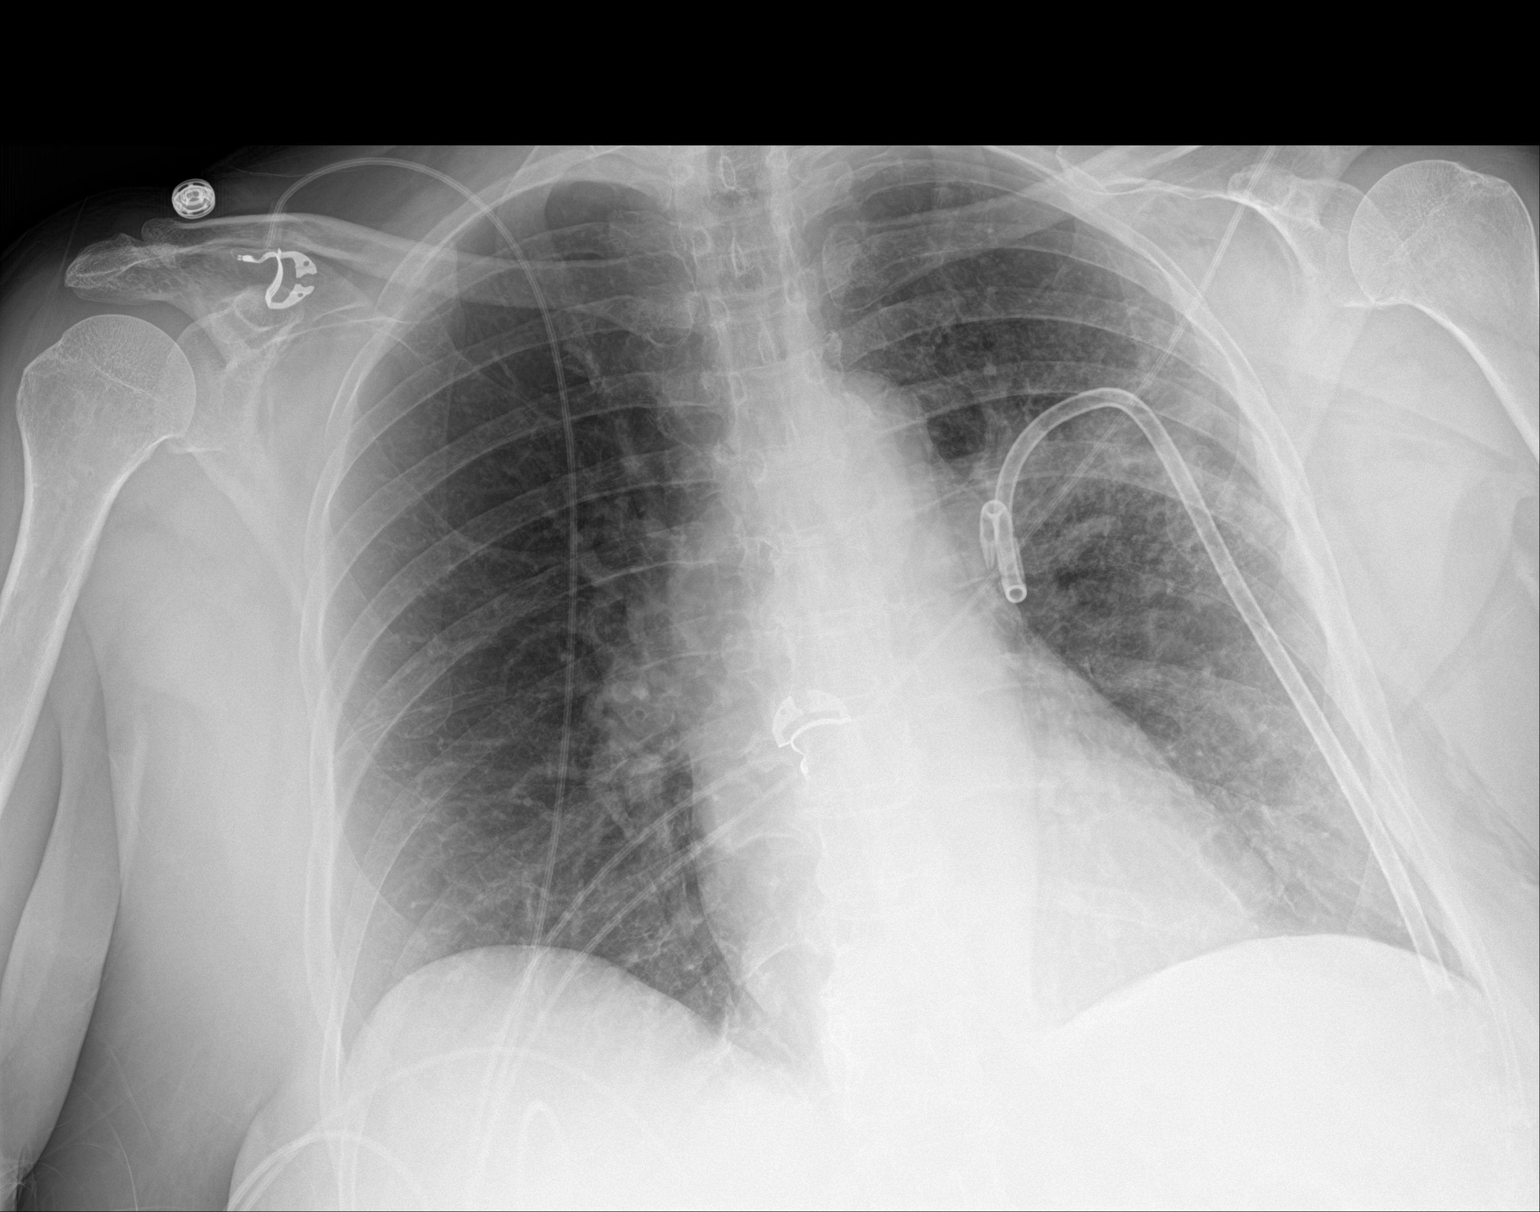

[1 of 1 positions shown; findings below may reference images not displayed]

FINDINGS: Pigtail chest tube on the left is unchanged in position. Tiny left
apical pneumothorax approximately 5 mm. Patchy airspace disease on
the left unchanged. No effusion.

Right lung remains clear.
IMPRESSION: Tiny left apical pneumothorax. Patchy airspace disease left upper
lobe unchanged, possible mass lesion based on CT.

## 2021-07-04 ENCOUNTER — Telehealth: Payer: Self-pay | Admitting: Internal Medicine

## 2021-07-04 NOTE — Telephone Encounter (Signed)
Rescheduled 06/22 appointment to 06/19 due to provider pal and patient's request, patient has been called and notified.  ?

## 2021-07-09 IMAGING — DX DG CHEST 2V
3 series · 3 of 3 positions shown · non-contrast
Comparison: 10/23/2019

CLINICAL DATA: History of pneumothorax. Left upper lobe lung
lesion.

EXAM:
CHEST - 2 VIEW

[chest pa (1 of 2)]
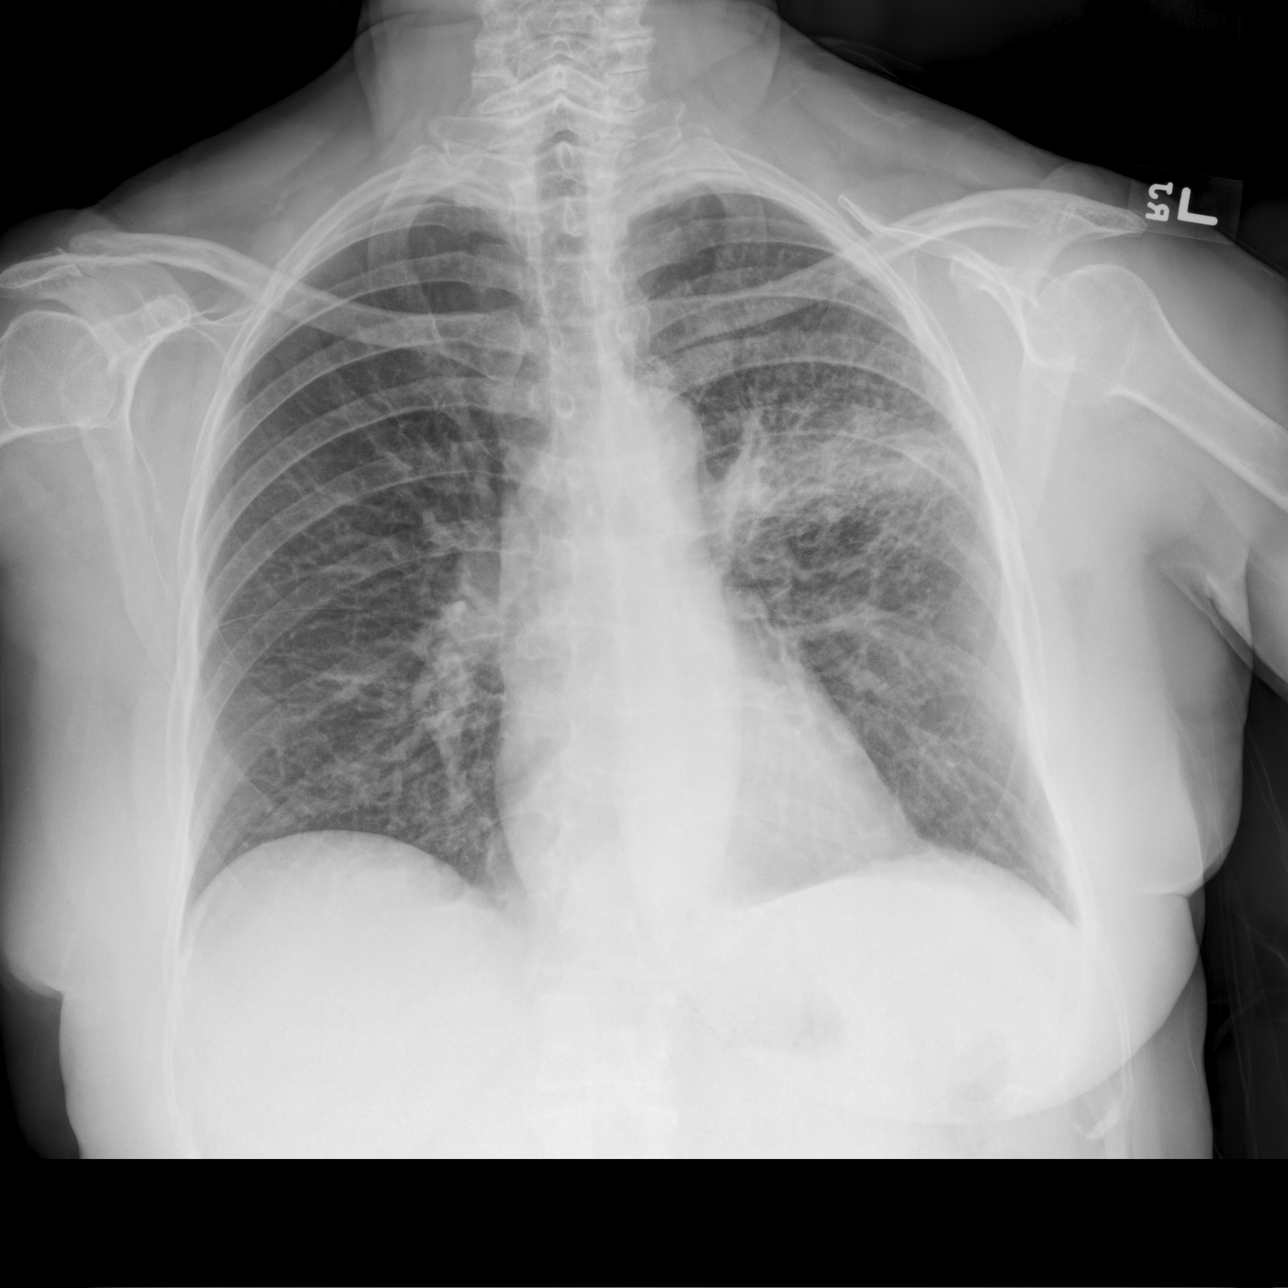

[chest pa (2 of 2)]
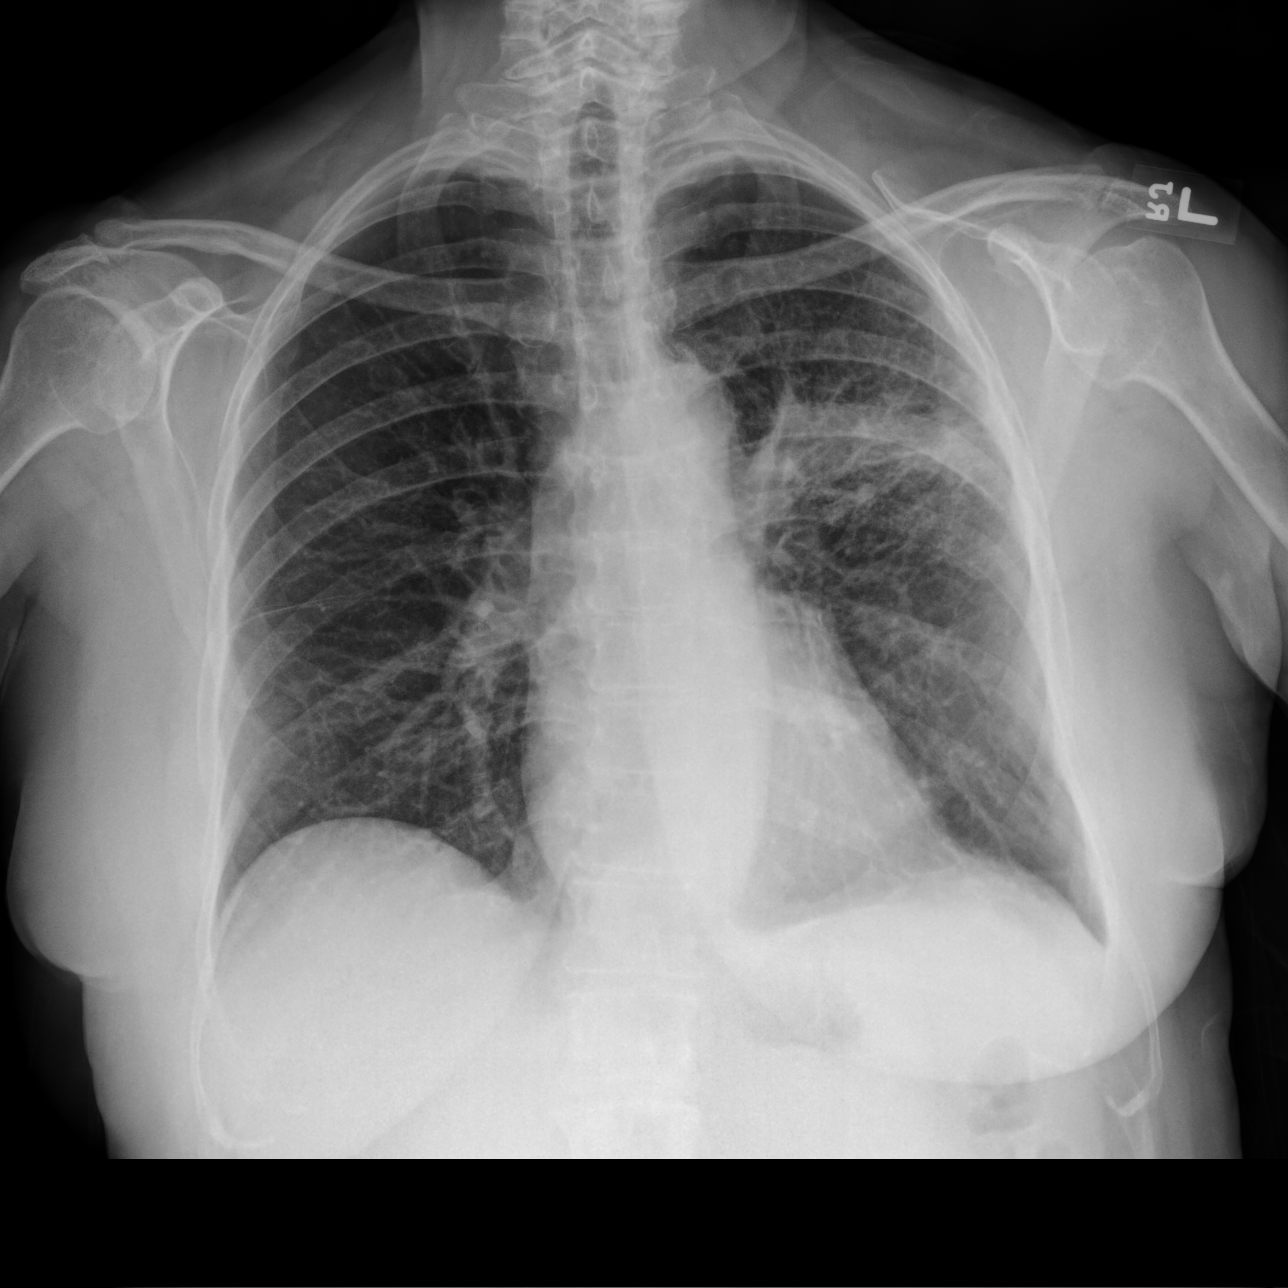

[chest lat]
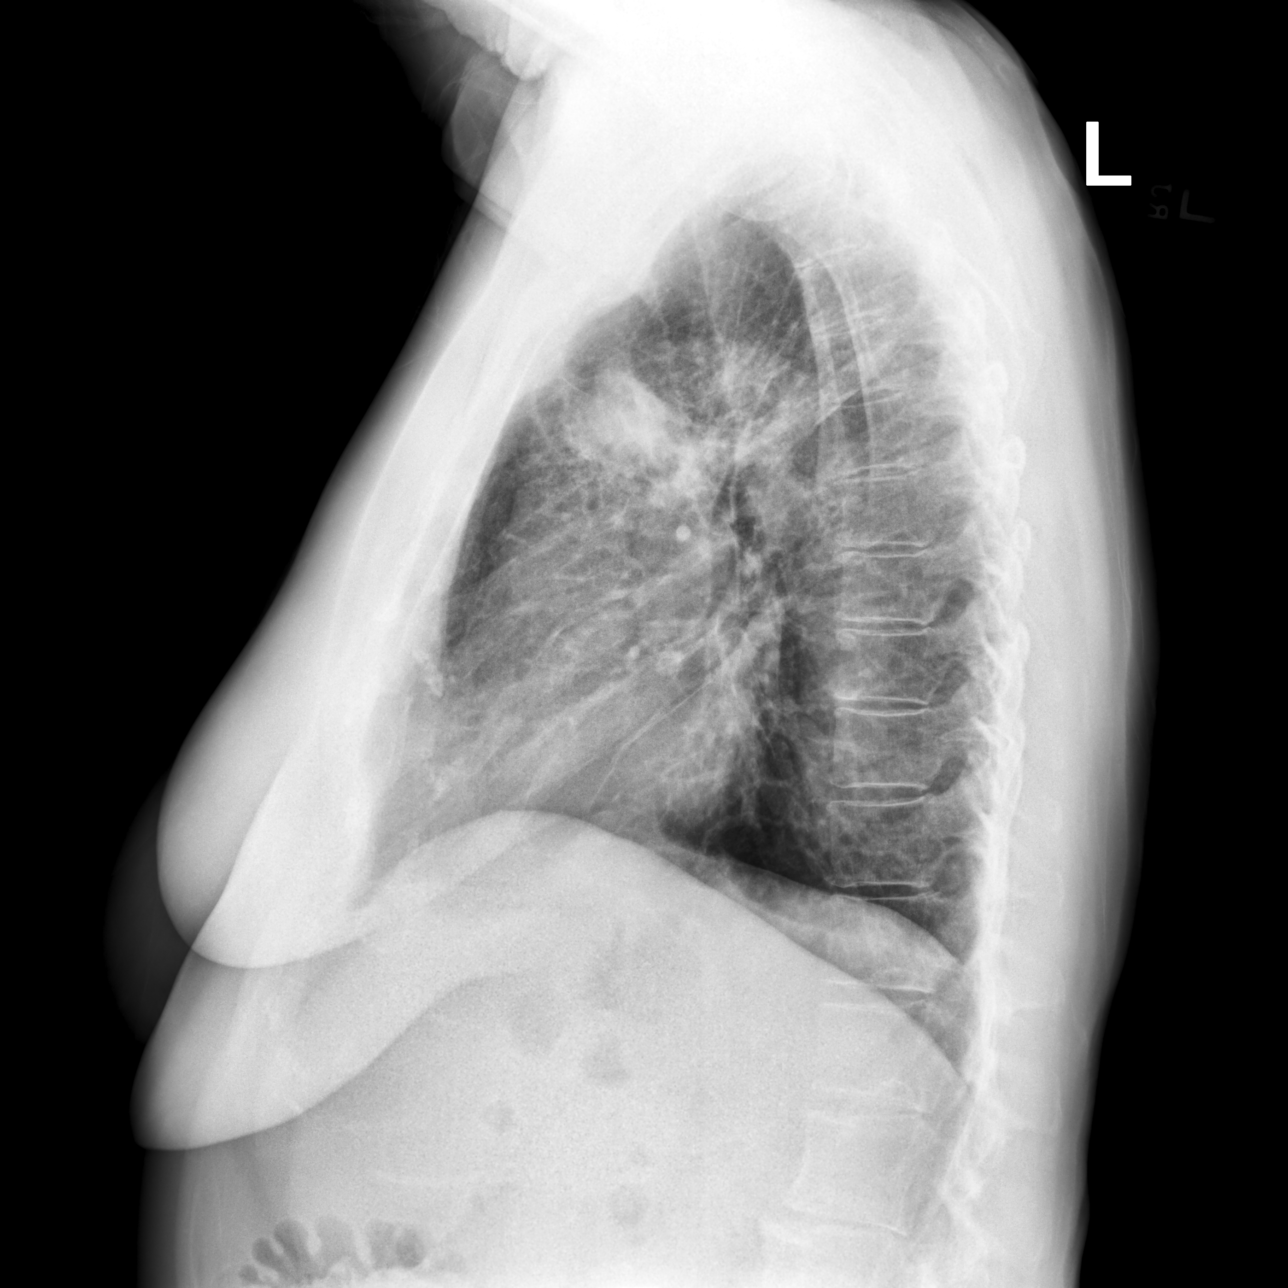

[3 of 3 positions shown; findings below may reference images not displayed]

FINDINGS: The left-sided chest tube is been removed. No pneumothorax is
identified. Persistent ill-defined left upper lobe density with
surrounding interstitial changes. No pleural effusion. The bony
thorax is intact.
IMPRESSION: Removal of left-sided chest tube without pneumothorax.

Persistent ill-defined left upper lobe opacity with surrounding
interstitial changes.

## 2021-07-10 ENCOUNTER — Ambulatory Visit
Admission: RE | Admit: 2021-07-10 | Discharge: 2021-07-10 | Disposition: A | Discharge status: Home / Self Care | Payer: Medicare Other | Source: Ambulatory Visit | Attending: Family Medicine | Admitting: Family Medicine

## 2021-07-10 DIAGNOSIS — Z1231 Encounter for screening mammogram for malignant neoplasm of breast: Secondary | ICD-10-CM | POA: Diagnosis not present

## 2021-07-11 ENCOUNTER — Other Ambulatory Visit (HOSPITAL_COMMUNITY): Payer: Self-pay

## 2021-07-14 ENCOUNTER — Other Ambulatory Visit: Payer: Self-pay | Admitting: Internal Medicine

## 2021-07-14 ENCOUNTER — Other Ambulatory Visit (HOSPITAL_COMMUNITY): Payer: Self-pay

## 2021-07-14 DIAGNOSIS — C3492 Malignant neoplasm of unspecified part of left bronchus or lung: Secondary | ICD-10-CM

## 2021-07-14 MED ORDER — OSIMERTINIB MESYLATE 80 MG PO TABS
ORAL_TABLET | Freq: Every day | ORAL | 2 refills | Status: DC
  Filled 2021-07-18: qty 30, 30d supply, fill #0
  Filled 2021-08-17: qty 30, 30d supply, fill #1
  Filled 2021-09-25: qty 30, 30d supply, fill #2

## 2021-07-18 ENCOUNTER — Other Ambulatory Visit (HOSPITAL_COMMUNITY): Payer: Self-pay

## 2021-07-19 ENCOUNTER — Ambulatory Visit (HOSPITAL_COMMUNITY): Payer: Medicare Other | Attending: Cardiology

## 2021-07-19 ENCOUNTER — Telehealth: Payer: Self-pay | Admitting: Internal Medicine

## 2021-07-19 DIAGNOSIS — I2510 Atherosclerotic heart disease of native coronary artery without angina pectoris: Secondary | ICD-10-CM | POA: Insufficient documentation

## 2021-07-19 DIAGNOSIS — R0602 Shortness of breath: Secondary | ICD-10-CM | POA: Diagnosis not present

## 2021-07-19 DIAGNOSIS — I2584 Coronary atherosclerosis due to calcified coronary lesion: Secondary | ICD-10-CM | POA: Diagnosis not present

## 2021-07-19 LAB — ECHOCARDIOGRAM COMPLETE
Area-P 1/2: 5.38 cm2
P 1/2 time: 564 msec

## 2021-07-19 NOTE — Telephone Encounter (Signed)
Called patient regarding upcoming June appointments, patient is notified.  

## 2021-07-25 ENCOUNTER — Other Ambulatory Visit (HOSPITAL_COMMUNITY): Payer: Self-pay

## 2021-08-03 DIAGNOSIS — H5203 Hypermetropia, bilateral: Secondary | ICD-10-CM | POA: Diagnosis not present

## 2021-08-07 ENCOUNTER — Other Ambulatory Visit: Payer: Self-pay

## 2021-08-07 ENCOUNTER — Inpatient Hospital Stay (HOSPITAL_BASED_OUTPATIENT_CLINIC_OR_DEPARTMENT_OTHER): Payer: Medicare Other | Admitting: Internal Medicine

## 2021-08-07 ENCOUNTER — Inpatient Hospital Stay: Payer: Medicare Other | Attending: Internal Medicine

## 2021-08-07 ENCOUNTER — Other Ambulatory Visit: Payer: Self-pay | Admitting: Lab

## 2021-08-07 VITALS — BP 123/76 | HR 81 | Temp 98.0°F | Resp 16 | Ht 65.0 in | Wt 165.7 lb

## 2021-08-07 DIAGNOSIS — C3412 Malignant neoplasm of upper lobe, left bronchus or lung: Secondary | ICD-10-CM | POA: Diagnosis not present

## 2021-08-07 DIAGNOSIS — C3492 Malignant neoplasm of unspecified part of left bronchus or lung: Secondary | ICD-10-CM

## 2021-08-07 DIAGNOSIS — Z5111 Encounter for antineoplastic chemotherapy: Secondary | ICD-10-CM | POA: Diagnosis not present

## 2021-08-07 DIAGNOSIS — J90 Pleural effusion, not elsewhere classified: Secondary | ICD-10-CM | POA: Diagnosis not present

## 2021-08-07 DIAGNOSIS — C3490 Malignant neoplasm of unspecified part of unspecified bronchus or lung: Secondary | ICD-10-CM

## 2021-08-07 DIAGNOSIS — T148XXA Other injury of unspecified body region, initial encounter: Secondary | ICD-10-CM | POA: Diagnosis not present

## 2021-08-07 LAB — CMP (CANCER CENTER ONLY)
ALT: 12 U/L (ref 0–44)
AST: 16 U/L (ref 15–41)
Albumin: 4.2 g/dL (ref 3.5–5.0)
Alkaline Phosphatase: 81 U/L (ref 38–126)
Anion gap: 5 (ref 5–15)
BUN: 16 mg/dL (ref 8–23)
CO2: 29 mmol/L (ref 22–32)
Calcium: 9.8 mg/dL (ref 8.9–10.3)
Chloride: 107 mmol/L (ref 98–111)
Creatinine: 1.01 mg/dL — ABNORMAL HIGH (ref 0.44–1.00)
GFR, Estimated: 60 mL/min — ABNORMAL LOW (ref >60)
Glucose, Bld: 126 mg/dL — ABNORMAL HIGH (ref 70–99)
Potassium: 4.3 mmol/L (ref 3.5–5.1)
Sodium: 141 mmol/L (ref 135–145)
Total Bilirubin: 0.4 mg/dL (ref 0.3–1.2)
Total Protein: 6.6 g/dL (ref 6.5–8.1)

## 2021-08-07 LAB — CBC WITH DIFFERENTIAL (CANCER CENTER ONLY)
Abs Immature Granulocytes: 0.01 10*3/uL (ref 0.00–0.07)
Basophils Absolute: 0 10*3/uL (ref 0.0–0.1)
Basophils Relative: 0 %
Eosinophils Absolute: 0.2 10*3/uL (ref 0.0–0.5)
Eosinophils Relative: 5 %
HCT: 35 % — ABNORMAL LOW (ref 36.0–46.0)
Hemoglobin: 12 g/dL (ref 12.0–15.0)
Immature Granulocytes: 0 %
Lymphocytes Relative: 24 %
Lymphs Abs: 1 10*3/uL (ref 0.7–4.0)
MCH: 32 pg (ref 26.0–34.0)
MCHC: 34.3 g/dL (ref 30.0–36.0)
MCV: 93.3 fL (ref 80.0–100.0)
Monocytes Absolute: 0.5 10*3/uL (ref 0.1–1.0)
Monocytes Relative: 11 %
Neutro Abs: 2.4 10*3/uL (ref 1.7–7.7)
Neutrophils Relative %: 60 %
Platelet Count: 110 10*3/uL — ABNORMAL LOW (ref 150–400)
RBC: 3.75 MIL/uL — ABNORMAL LOW (ref 3.87–5.11)
RDW: 12.5 % (ref 11.5–15.5)
WBC Count: 4 10*3/uL (ref 4.0–10.5)
nRBC: 0 % (ref 0.0–0.2)

## 2021-08-07 MED ORDER — DOXYCYCLINE HYCLATE 100 MG PO TABS: 100.0000 mg | ORAL_TABLET | Freq: Two times a day (BID) | ORAL | 0 refills | Status: DC

## 2021-08-07 NOTE — Progress Notes (Signed)
[place] [phone]   [phone]  OFFICE PROGRESS NOTE  [name], MD [place] [address]  DIAGNOSIS: Stage IIIc (T3, N3, M0) non-small cell lung cancer, adenocarcinoma presented with large left upper lobe lung mass in addition to left infrahilar and right hilar and subcarinal lymphadenopathy diagnosed in August 2021.   Molecular Biomarkers: Insufficient tissue for foundation 1. Guardant 360 results.  DETECTED ALTERATION(S) / BIOMARKER(S) % CFDNA OR AMPLIFICATION ASSOCIATED FDA-APPROVED THERAPIES CLINICAL TRIAL AVAILABILITY  EGFR L858R, 1.2%, Afatinib, Dacomitinib, Erlotinib, Gefitinib, Osimertinib, Ramucirumab Yes EGFRL833V, 1.0%, Afatinib, Dacomitinib, Erlotinib, Gefitinib, Osimertinib, Ramucirumab Yes [id number] 1.0% None  No   PRIOR THERAPY: Weekly concurrent chemoradiation with carboplatin for an AUC of 2 and paclitaxel 45 mg/m2.  First dose expected on 11/16/2019. Status post 7 cycles.   CURRENT THERAPY: Tagrisso 80 mg p.o. daily. First dose started 01/31/2020.  Status post 18 months of treatment.  INTERVAL HISTORY: Linda Woods 71 y.o. female returns to the clinic today for follow-up visit.  The patient is feeling fine today with no concerning complaints except for a tick bite in her right thigh that have been a week ago.  Her primary care physician is out of the office and she could not get any treatment for it.  She is asking for doxycycline prescription.  She is feeling fine with no concerning issues today.  She denied having any chest pain, shortness of breath, cough or hemoptysis.  She denied having any fever or chills.  She has no nausea, vomiting, diarrhea or constipation.  She has no headache or visual changes.  She has no recent weight loss or night sweats.  She continues to tolerate her treatment with Tagrisso fairly well.  The patient is here today for evaluation with repeat blood work. MEDICAL  HISTORY: Past Medical History:  Diagnosis Date   Anemia    as a child   Anxiety    Asthma 10/19/2020   Bursitis    Chronic reflux esophagitis    Complication of anesthesia    Diarrhea, functional    Diverticulosis    GERD (gastroesophageal reflux disease)    History of kidney stones    Hypertension    Knee pain    right knee-seeing ortho   lung ca 09/2019   Osteoarthritis    Plantar fasciitis    PONV (postoperative nausea and vomiting)    has used the patch before and that helps   Pure hypercholesterolemia    RLS (restless legs syndrome)    Superficial vein thrombosis    Thyroid disease    hypothyroid    ALLERGIES:  has No Known Allergies.  MEDICATIONS:  Current Outpatient Medications  Medication Sig Dispense Refill   albuterol (VENTOLIN HFA) 108 (90 Base) MCG/ACT inhaler Inhale 2 puffs into the lungs every 6 (six) hours as needed for wheezing or shortness of breath. 8.5 g 0   cholecalciferol (VITAMIN D3) 25 MCG (1000 UNIT) tablet Take 1,000 Units by mouth daily. Pt states she take 2000 units/day     cyclobenzaprine (FLEXERIL) 10 MG tablet Take 10 mg by mouth 3 (three) times daily.     famotidine (PEPCID) 40 MG tablet Take 40 mg by mouth See admin instructions. 4 times a week     ferrous sulfate 325 (65 FE) MG tablet Take 325 mg by mouth daily with breakfast.     fexofenadine (ALLEGRA) 180 MG tablet Take 180 mg by mouth daily.  fluticasone (FLONASE) 50 MCG/ACT nasal spray Place 2 sprays into both nostrils daily.     HYDROcodone-acetaminophen (NORCO/VICODIN) 5-325 MG tablet Take 0.5 tablets by mouth daily as needed (Knee/Back pain).     HYDROcodone-homatropine (HYCODAN) 5-1.5 MG/5ML syrup Take 5 mLs by mouth daily as needed for cough.     lidocaine (LIDODERM) 5 % 1 patch daily.     loperamide (IMODIUM) 2 MG capsule Take 2-4 mg by mouth every 6 (six) hours as needed.     LORazepam (ATIVAN) 0.5 MG tablet Take 0.5 mg by mouth at bedtime.      magic mouthwash SOLN 5 to 10  ml swish and spit or swallow QID prn mouth and esophageal pain 240 mL 2   Magnesium-Potassium 250-100 MG TABS Take 1 tablet by mouth daily.     melatonin 5 MG TABS Take 5 mg by mouth at bedtime.     metoprolol succinate (TOPROL-XL) 50 MG 24 hr tablet Take 50 mg by mouth daily.      MYRBETRIQ 50 MG TB24 tablet Take 50 mg by mouth daily.     osimertinib mesylate (TAGRISSO) 80 MG tablet TAKE 1 TABLET BY MOUTH DAILY. 30 tablet 2   oxymetazoline (AFRIN) 0.05 % nasal spray Place 1 spray into both nostrils daily as needed for congestion.     phenazopyridine (PYRIDIUM) 200 MG tablet Take 200 mg by mouth daily as needed.     RABEprazole (ACIPHEX) 20 MG tablet Take 1 tablet (20 mg total) by mouth 2 (two) times a week.     rosuvastatin (CRESTOR) 5 MG tablet Take 5 mg by mouth daily.     SYNTHROID 112 MCG tablet Take 112 mcg by mouth daily before breakfast.      vitamin B-12 (CYANOCOBALAMIN) 1000 MCG tablet Take 1,000 mcg by mouth daily.     No current facility-administered medications for this visit.    SURGICAL HISTORY:  Past Surgical History:  Procedure Laterality Date   APPENDECTOMY     BRONCHIAL BIOPSY  10/20/2019   Procedure: BRONCHIAL BIOPSIES;  Surgeon: Collene Gobble, MD;  Location: [facility] ENDOSCOPY;  Service: Pulmonary;;   BRONCHIAL BRUSHINGS  10/20/2019   Procedure: BRONCHIAL BRUSHINGS;  Surgeon: Collene Gobble, MD;  Location: [facility] ENDOSCOPY;  Service: Pulmonary;;   BRONCHIAL NEEDLE ASPIRATION BIOPSY  10/20/2019   Procedure: BRONCHIAL NEEDLE ASPIRATION BIOPSIES;  Surgeon: Collene Gobble, MD;  Location: [facility] ENDOSCOPY;  Service: Pulmonary;;   BRONCHIAL WASHINGS  10/20/2019   Procedure: BRONCHIAL WASHINGS;  Surgeon: Collene Gobble, MD;  Location: [facility] ENDOSCOPY;  Service: Pulmonary;;   CARPAL TUNNEL RELEASE Right 2011   Dr. Amedeo Plenty   COLPORRHAPHY     posterior   HAND SURGERY Right 2016   Nerve surgery, Dr. Amedeo Plenty   OVARIAN CYST REMOVAL     RECTOCELE REPAIR  2011   w/TVH and sling    TONSILLECTOMY     TONSILLECTOMY     TOTAL KNEE ARTHROPLASTY Left 09/09/2018   Procedure: TOTAL KNEE ARTHROPLASTY, CORTISONE INJECTION RIGHT KNEE;  Surgeon: Paralee Cancel, MD;  Location: [facility] ORS;  Service: Orthopedics;  Laterality: Left;  70 mins   TOTAL VAGINAL HYSTERECTOMY  10/18/2009   rectocele repair, sling   TUBAL LIGATION Bilateral    VARICOSE VEIN SURGERY     VIDEO BRONCHOSCOPY WITH ENDOBRONCHIAL NAVIGATION N/A 10/20/2019   Procedure: VIDEO BRONCHOSCOPY WITH ENDOBRONCHIAL NAVIGATION;  Surgeon: Collene Gobble, MD;  Location: [facility] ENDOSCOPY;  Service: Pulmonary;  Laterality: N/A;    REVIEW OF SYSTEMS:  A comprehensive review of systems was negative except for: Constitutional: positive for fatigue   PHYSICAL EXAMINATION: General appearance: alert, cooperative, fatigued, and no distress Head: Normocephalic, without obvious abnormality, atraumatic Neck: no adenopathy, no JVD, supple, symmetrical, trachea midline, and thyroid not enlarged, symmetric, no tenderness/mass/nodules Lymph nodes: Cervical, supraclavicular, and axillary nodes normal. Resp: clear to auscultation bilaterally Back: symmetric, no curvature. ROM normal. No CVA tenderness. Cardio: regular rate and rhythm, S1, S2 normal, no murmur, click, rub or gallop GI: soft, non-tender; bowel sounds normal; no masses,  no organomegaly Extremities: extremities normal, atraumatic, no cyanosis or edema  ECOG PERFORMANCE STATUS: 1 - Symptomatic but completely ambulatory  Blood pressure 123/76, pulse 81, temperature 98 F (36.7 C), temperature source Oral, resp. rate 16, height 5' 5" (1.651 m), weight 165 lb 11.2 oz (75.2 kg), last menstrual period 08/21/2002, SpO2 99 %.  LABORATORY DATA: Lab Results  Component Value Date   WBC 4.0 08/07/2021   HGB 12.0 08/07/2021   HCT 35.0 (L) 08/07/2021   MCV 93.3 08/07/2021   PLT 110 (L) 08/07/2021      Chemistry      Component Value Date/Time   NA 141 08/07/2021 0829   K 4.3 08/07/2021  0829   CL 107 08/07/2021 0829   CO2 29 08/07/2021 0829   BUN 16 08/07/2021 0829   CREATININE 1.01 (H) 08/07/2021 0829      Component Value Date/Time   CALCIUM 9.8 08/07/2021 0829   ALKPHOS 81 08/07/2021 0829   AST 16 08/07/2021 0829   ALT 12 08/07/2021 0829   BILITOT 0.4 08/07/2021 0829       RADIOGRAPHIC STUDIES: ECHOCARDIOGRAM COMPLETE  Result Date: 07/19/2021    ECHOCARDIOGRAM REPORT   Patient Name:   Linda Woods Date of Exam: 07/19/2021 Medical Rec #:  [id number]           Height:       65.5 in Accession #:    [id number]          Weight:       166.0 lb Date of Birth:  29-Sep-1950          BSA:          1.838 m Patient Age:    68 years            BP:           114/72 mmHg Patient Gender: F                   HR:           91 bpm. Exam Location:  [facility] Procedure: 2D Echo, 3D Echo, Cardiac Doppler, Color Doppler and Strain Analysis Indications:    R06.00 Dyspnea  History:        Patient has no prior history of Echocardiogram examinations.                 Risk Factors:Hypertension. Lung cancer.  Sonographer:    Wilford Sports Rodgers-Jones RDCS Referring Phys: [facility]  1. Left ventricular ejection fraction, by estimation, is 55 to 60%. The left ventricle has normal function. The left ventricle has no regional wall motion abnormalities. Left ventricular diastolic parameters are consistent with Grade I diastolic dysfunction (impaired relaxation). The average left ventricular global longitudinal strain is -19.2 %.  2. Right ventricular systolic function is normal. The right ventricular size is normal. There is normal pulmonary artery systolic pressure. The estimated right ventricular systolic pressure is 96.2 mmHg.  3. The mitral valve is normal in structure. Moderate thickening/calcification of leaflets. No evidence of mitral valve regurgitation. No evidence of mitral stenosis.  4. The aortic valve is tricuspid. Aortic valve regurgitation is trivial. No aortic  stenosis is present.  5. The inferior vena cava is normal in size with greater than 50% respiratory variability, suggesting right atrial pressure of 3 mmHg. FINDINGS  Left Ventricle: Left ventricular ejection fraction, by estimation, is 55 to 60%. The left ventricle has normal function. The left ventricle has no regional wall motion abnormalities. The average left ventricular global longitudinal strain is -19.2 %. The left ventricular internal cavity size was normal in size. There is no left ventricular hypertrophy. Left ventricular diastolic parameters are consistent with Grade I diastolic dysfunction (impaired relaxation). Right Ventricle: The right ventricular size is normal. No increase in right ventricular wall thickness. Right ventricular systolic function is normal. There is normal pulmonary artery systolic pressure. The tricuspid regurgitant velocity is 2.68 m/s, and  with an assumed right atrial pressure of 3 mmHg, the estimated right ventricular systolic pressure is 09.3 mmHg. Left Atrium: Left atrial size was normal in size. Right Atrium: Right atrial size was normal in size. Pericardium: Trivial pericardial effusion is present. Mitral Valve: The mitral valve is normal in structure. There is moderate thickening of the mitral valve leaflet(s). No evidence of mitral valve regurgitation. No evidence of mitral valve stenosis. Tricuspid Valve: The tricuspid valve is normal in structure. Tricuspid valve regurgitation is trivial. Aortic Valve: The aortic valve is tricuspid. Aortic valve regurgitation is trivial. Aortic regurgitation PHT measures 564 msec. No aortic stenosis is present. Pulmonic Valve: The pulmonic valve was not well visualized. Pulmonic valve regurgitation is not visualized. Aorta: The aortic root and ascending aorta are structurally normal, with no evidence of dilitation. Venous: The inferior vena cava is normal in size with greater than 50% respiratory variability, suggesting right atrial  pressure of 3 mmHg. IAS/Shunts: The interatrial septum was not well visualized.  LEFT VENTRICLE PLAX 2D LVOT diam:     1.90 cm   Diastology LV SV:         41        LV e' medial:    5.66 cm/s LV SV Index:   22        LV E/e' medial:  10.3 LVOT Area:     2.84 cm  LV e' lateral:   11.60 cm/s                          LV E/e' lateral: 5.0                           2D Longitudinal Strain                          2D Strain GLS (A2C):   -19.5 %                          2D Strain GLS (A3C):   -19.1 %                          2D Strain GLS (A4C):   -19.0 %                          2D Strain GLS  Avg:     -19.2 %                           3D Volume EF:                          3D EF:        55 %                          LV EDV:       91 ml                          LV ESV:       41 ml                          LV SV:        50 ml RIGHT VENTRICLE RV Basal diam:  3.60 cm RV S prime:     12.75 cm/s TAPSE (M-mode): 1.9 cm LEFT ATRIUM             Index        RIGHT ATRIUM           Index LA Vol (A2C):   42.8 ml 23.29 ml/m  RA Area:     12.30 cm LA Vol (A4C):   38.0 ml 20.68 ml/m  RA Volume:   30.30 ml  16.49 ml/m LA Biplane Vol: 40.5 ml 22.04 ml/m  AORTIC VALVE LVOT Vmax:   83.93 cm/s LVOT Vmean:  58.000 cm/s LVOT VTI:    0.143 m AI PHT:      564 msec  AORTA Ao Asc diam: 3.10 cm MITRAL VALVE               TRICUSPID VALVE MV Area (PHT): 5.38 cm    TR Peak grad:   28.7 mmHg MV Decel Time: 141 msec    TR Vmax:        268.00 cm/s MV E velocity: 58.15 cm/s MV A velocity: 86.70 cm/s  SHUNTS MV E/A ratio:  0.67        Systemic VTI:  0.14 m                            Systemic Diam: 1.90 cm Oswaldo Milian MD Electronically signed by Oswaldo Milian MD Signature Date/Time: 07/19/2021/10:33:20 PM    Final    MM 3D SCREEN BREAST BILATERAL  Result Date: 07/11/2021 CLINICAL DATA:  Screening. EXAM: DIGITAL SCREENING BILATERAL MAMMOGRAM WITH TOMOSYNTHESIS AND CAD TECHNIQUE: Bilateral screening digital craniocaudal and  mediolateral oblique mammograms were obtained. Bilateral screening digital breast tomosynthesis was performed. The images were evaluated with computer-aided detection. COMPARISON:  Previous exam(s). ACR Breast Density Category b: There are scattered areas of fibroglandular density. FINDINGS: There are no findings suspicious for malignancy. IMPRESSION: No mammographic evidence of malignancy. A result letter of this screening mammogram will be mailed directly to the patient. RECOMMENDATION: Screening mammogram in one year. (Code:SM-B-01Y) BI-RADS CATEGORY  1: Negative. Electronically Signed   By: Franki Cabot M.D.   On: 07/11/2021 14:48    ASSESSMENT AND PLAN: This is a very pleasant 71 years old white female recently diagnosed with a stage IIIc/IV (T3, N3, M0/M1a) non-small cell lung cancer, adenocarcinoma presented with large left upper  lobe lung mass in addition to left infrahilar, right hilar and subcarinal lymphadenopathy and small left pleural effusion diagnosed in August 2021 with positive EGFR mutation in exon 21 (L858R). The patient completed a course of concurrent chemoradiation with weekly carboplatin and paclitaxel status post 7 cycles.  She tolerated her treatment well except for fatigue as well as a skin burn and cough. Her scan showed mild improvement of the left upper lobe lung mass as well as the mediastinal lymphadenopathy.  The patient continues to have small left pleural effusion that is suspicious given her presentation. She is currently on treatment with Tagrisso 80 mg p.o. daily started on January 31, 2020.  She is status post 18 months of treatment.  She continues to tolerate this treatment well with no concerning adverse effects. I recommended for the patient to proceed with her treatment with Tagrisso as planned. I will see her back for follow-up visit in 2 months for evaluation with repeat CT scan of the chest for restaging of her disease. For the tick bite, I will give her a  prescription for doxycycline 100 mg p.o. twice daily for 1 week.  She was advised to reach out to her primary care physician if no improvement. The patient was advised to call immediately if she has any other concerning symptoms in the interval. The patient voices understanding of current disease status and treatment options and is in agreement with the current care plan.  All questions were answered. The patient knows to call the clinic with any problems, questions or concerns. We can certainly see the patient much sooner if necessary.  Disclaimer: This note was dictated with voice recognition software. Similar sounding words can inadvertently be transcribed and may not be corrected upon review.

## 2021-08-10 ENCOUNTER — Ambulatory Visit: Payer: Medicare Other | Admitting: Internal Medicine

## 2021-08-10 ENCOUNTER — Other Ambulatory Visit: Payer: Medicare Other

## 2021-08-11 ENCOUNTER — Other Ambulatory Visit (HOSPITAL_COMMUNITY): Payer: Self-pay

## 2021-08-11 DIAGNOSIS — M25561 Pain in right knee: Secondary | ICD-10-CM | POA: Diagnosis not present

## 2021-08-11 DIAGNOSIS — M1711 Unilateral primary osteoarthritis, right knee: Secondary | ICD-10-CM | POA: Diagnosis not present

## 2021-08-15 ENCOUNTER — Other Ambulatory Visit (HOSPITAL_COMMUNITY): Payer: Self-pay

## 2021-08-15 ENCOUNTER — Ambulatory Visit: Payer: Medicare Other | Admitting: Internal Medicine

## 2021-08-15 ENCOUNTER — Other Ambulatory Visit: Payer: Medicare Other

## 2021-08-17 ENCOUNTER — Other Ambulatory Visit (HOSPITAL_COMMUNITY): Payer: Self-pay

## 2021-08-18 ENCOUNTER — Other Ambulatory Visit (HOSPITAL_COMMUNITY): Payer: Self-pay

## 2021-08-25 ENCOUNTER — Telehealth: Payer: Self-pay | Admitting: Internal Medicine

## 2021-08-25 NOTE — Telephone Encounter (Signed)
Called patient regarding upcoming August appointments, patient has been called and voicemail was left.

## 2021-08-31 DIAGNOSIS — R109 Unspecified abdominal pain: Secondary | ICD-10-CM | POA: Diagnosis not present

## 2021-08-31 DIAGNOSIS — N3000 Acute cystitis without hematuria: Secondary | ICD-10-CM | POA: Diagnosis not present

## 2021-08-31 DIAGNOSIS — R350 Frequency of micturition: Secondary | ICD-10-CM | POA: Diagnosis not present

## 2021-09-04 DIAGNOSIS — I10 Essential (primary) hypertension: Secondary | ICD-10-CM | POA: Diagnosis not present

## 2021-09-04 DIAGNOSIS — H26492 Other secondary cataract, left eye: Secondary | ICD-10-CM | POA: Diagnosis not present

## 2021-09-04 DIAGNOSIS — H04123 Dry eye syndrome of bilateral lacrimal glands: Secondary | ICD-10-CM | POA: Diagnosis not present

## 2021-09-04 DIAGNOSIS — H02831 Dermatochalasis of right upper eyelid: Secondary | ICD-10-CM | POA: Diagnosis not present

## 2021-09-11 ENCOUNTER — Other Ambulatory Visit: Payer: Self-pay

## 2021-09-12 DIAGNOSIS — H5203 Hypermetropia, bilateral: Secondary | ICD-10-CM | POA: Diagnosis not present

## 2021-09-12 DIAGNOSIS — H52223 Regular astigmatism, bilateral: Secondary | ICD-10-CM | POA: Diagnosis not present

## 2021-09-12 DIAGNOSIS — Z961 Presence of intraocular lens: Secondary | ICD-10-CM | POA: Diagnosis not present

## 2021-09-12 DIAGNOSIS — Z9849 Cataract extraction status, unspecified eye: Secondary | ICD-10-CM | POA: Diagnosis not present

## 2021-09-18 ENCOUNTER — Other Ambulatory Visit (HOSPITAL_COMMUNITY): Payer: Self-pay

## 2021-09-25 ENCOUNTER — Other Ambulatory Visit (HOSPITAL_COMMUNITY): Payer: Self-pay

## 2021-09-26 ENCOUNTER — Other Ambulatory Visit (HOSPITAL_COMMUNITY): Payer: Self-pay

## 2021-09-27 DIAGNOSIS — H26491 Other secondary cataract, right eye: Secondary | ICD-10-CM | POA: Diagnosis not present

## 2021-09-28 ENCOUNTER — Other Ambulatory Visit (HOSPITAL_COMMUNITY): Payer: Self-pay

## 2021-10-04 DIAGNOSIS — Z961 Presence of intraocular lens: Secondary | ICD-10-CM | POA: Diagnosis not present

## 2021-10-04 DIAGNOSIS — H5203 Hypermetropia, bilateral: Secondary | ICD-10-CM | POA: Diagnosis not present

## 2021-10-04 DIAGNOSIS — H52223 Regular astigmatism, bilateral: Secondary | ICD-10-CM | POA: Diagnosis not present

## 2021-10-04 DIAGNOSIS — Z9849 Cataract extraction status, unspecified eye: Secondary | ICD-10-CM | POA: Diagnosis not present

## 2021-10-05 ENCOUNTER — Inpatient Hospital Stay: Payer: Medicare Other | Attending: Internal Medicine

## 2021-10-05 ENCOUNTER — Ambulatory Visit (HOSPITAL_COMMUNITY)
Admission: RE | Admit: 2021-10-05 | Discharge: 2021-10-05 | Disposition: A | Discharge status: Home / Self Care | Payer: Medicare Other | Source: Ambulatory Visit | Attending: Internal Medicine | Admitting: Internal Medicine

## 2021-10-05 ENCOUNTER — Other Ambulatory Visit: Payer: Self-pay

## 2021-10-05 DIAGNOSIS — C3412 Malignant neoplasm of upper lobe, left bronchus or lung: Secondary | ICD-10-CM | POA: Insufficient documentation

## 2021-10-05 DIAGNOSIS — C3492 Malignant neoplasm of unspecified part of left bronchus or lung: Secondary | ICD-10-CM

## 2021-10-05 DIAGNOSIS — C3490 Malignant neoplasm of unspecified part of unspecified bronchus or lung: Secondary | ICD-10-CM | POA: Diagnosis not present

## 2021-10-05 LAB — CMP (CANCER CENTER ONLY)
ALT: 15 U/L (ref 0–44)
AST: 16 U/L (ref 15–41)
Albumin: 4.2 g/dL (ref 3.5–5.0)
Alkaline Phosphatase: 74 U/L (ref 38–126)
Anion gap: 6 (ref 5–15)
BUN: 15 mg/dL (ref 8–23)
CO2: 27 mmol/L (ref 22–32)
Calcium: 9.1 mg/dL (ref 8.9–10.3)
Chloride: 107 mmol/L (ref 98–111)
Creatinine: 1.11 mg/dL — ABNORMAL HIGH (ref 0.44–1.00)
GFR, Estimated: 53 mL/min — ABNORMAL LOW (ref >60)
Glucose, Bld: 96 mg/dL (ref 70–99)
Potassium: 4.5 mmol/L (ref 3.5–5.1)
Sodium: 140 mmol/L (ref 135–145)
Total Bilirubin: 0.4 mg/dL (ref 0.3–1.2)
Total Protein: 6.6 g/dL (ref 6.5–8.1)

## 2021-10-05 LAB — CBC WITH DIFFERENTIAL (CANCER CENTER ONLY)
Abs Immature Granulocytes: 0.01 10*3/uL (ref 0.00–0.07)
Basophils Absolute: 0 10*3/uL (ref 0.0–0.1)
Basophils Relative: 0 %
Eosinophils Absolute: 0.2 10*3/uL (ref 0.0–0.5)
Eosinophils Relative: 5 %
HCT: 34.8 % — ABNORMAL LOW (ref 36.0–46.0)
Hemoglobin: 12 g/dL (ref 12.0–15.0)
Immature Granulocytes: 0 %
Lymphocytes Relative: 24 %
Lymphs Abs: 1.1 10*3/uL (ref 0.7–4.0)
MCH: 31.7 pg (ref 26.0–34.0)
MCHC: 34.5 g/dL (ref 30.0–36.0)
MCV: 91.8 fL (ref 80.0–100.0)
Monocytes Absolute: 0.4 10*3/uL (ref 0.1–1.0)
Monocytes Relative: 10 %
Neutro Abs: 2.7 10*3/uL (ref 1.7–7.7)
Neutrophils Relative %: 61 %
Platelet Count: 126 10*3/uL — ABNORMAL LOW (ref 150–400)
RBC: 3.79 MIL/uL — ABNORMAL LOW (ref 3.87–5.11)
RDW: 12.9 % (ref 11.5–15.5)
WBC Count: 4.4 10*3/uL (ref 4.0–10.5)
nRBC: 0 % (ref 0.0–0.2)

## 2021-10-05 MED ORDER — IOHEXOL 300 MG/ML  SOLN
75.0000 mL | Freq: Once | INTRAMUSCULAR | Status: AC | PRN
  Administered 2021-10-05: 75 mL via INTRAVENOUS

## 2021-10-09 ENCOUNTER — Inpatient Hospital Stay: Payer: Medicare Other | Admitting: Internal Medicine

## 2021-10-09 ENCOUNTER — Other Ambulatory Visit: Payer: Self-pay

## 2021-10-09 ENCOUNTER — Encounter: Payer: Self-pay | Admitting: Internal Medicine

## 2021-10-09 VITALS — BP 118/82 | HR 115 | Temp 97.8°F | Wt 159.0 lb

## 2021-10-09 DIAGNOSIS — C3492 Malignant neoplasm of unspecified part of left bronchus or lung: Secondary | ICD-10-CM | POA: Diagnosis not present

## 2021-10-09 DIAGNOSIS — C3412 Malignant neoplasm of upper lobe, left bronchus or lung: Secondary | ICD-10-CM | POA: Diagnosis not present

## 2021-10-09 NOTE — Progress Notes (Signed)
[place] [phone]   [phone]  OFFICE PROGRESS NOTE  [name], MD [place] [address]  DIAGNOSIS: Stage IIIc (T3, N3, M0) non-small cell lung cancer, adenocarcinoma presented with large left upper lobe lung mass in addition to left infrahilar and right hilar and subcarinal lymphadenopathy diagnosed in August 2021.   Molecular Biomarkers: Insufficient tissue for foundation 1. Guardant 360 results.  DETECTED ALTERATION(S) / BIOMARKER(S) % CFDNA OR AMPLIFICATION ASSOCIATED FDA-APPROVED THERAPIES CLINICAL TRIAL AVAILABILITY  EGFR L858R, 1.2%, Afatinib, Dacomitinib, Erlotinib, Gefitinib, Osimertinib, Ramucirumab Yes [id number], 1.0%, Afatinib, Dacomitinib, Erlotinib, Gefitinib, Osimertinib, Ramucirumab Yes [id number] 1.0% None  No   PRIOR THERAPY: Weekly concurrent chemoradiation with carboplatin for an AUC of 2 and paclitaxel 45 mg/m2.  First dose expected on 11/16/2019. Status post 7 cycles.   CURRENT THERAPY: Tagrisso 80 mg p.o. daily. First dose started 01/31/2020.  Status post 20 months of treatment.  INTERVAL HISTORY: Linda Woods 71 y.o. female returns to the clinic today for follow-up visit.  The patient is feeling fine today with no concerning complaints except for few episodes of diarrhea over the weekend after the contrast.  She denied having any current chest pain, shortness of breath but has mild cough with no hemoptysis.  She has no nausea, vomiting, no current diarrhea or constipation.  She has no recent weight loss or night sweats.  She has no headache or visual changes.  She has been tolerating her treatment with Tagrisso fairly well except for mild skin rash.  She is here today for evaluation with repeat CT scan of the chest for restaging of her disease.  MEDICAL HISTORY: Past Medical History:  Diagnosis Date   Anemia    as a child   Anxiety    Asthma 10/19/2020   Bursitis    Chronic reflux  esophagitis    Complication of anesthesia    Diarrhea, functional    Diverticulosis    GERD (gastroesophageal reflux disease)    History of kidney stones    Hypertension    Knee pain    right knee-seeing ortho   lung ca 09/2019   Osteoarthritis    Plantar fasciitis    PONV (postoperative nausea and vomiting)    has used the patch before and that helps   Pure hypercholesterolemia    RLS (restless legs syndrome)    Superficial vein thrombosis    Thyroid disease    hypothyroid    ALLERGIES:  has No Known Allergies.  MEDICATIONS:  Current Outpatient Medications  Medication Sig Dispense Refill   albuterol (VENTOLIN HFA) 108 (90 Base) MCG/ACT inhaler Inhale 2 puffs into the lungs every 6 (six) hours as needed for wheezing or shortness of breath. 8.5 g 0   cholecalciferol (VITAMIN D3) 25 MCG (1000 UNIT) tablet Take 1,000 Units by mouth daily. Pt states she take 2000 units/day     cyclobenzaprine (FLEXERIL) 10 MG tablet Take 10 mg by mouth 3 (three) times daily.     doxycycline (VIBRA-TABS) 100 MG tablet Take 1 tablet (100 mg total) by mouth 2 (two) times daily. 14 tablet 0   famotidine (PEPCID) 40 MG tablet Take 40 mg by mouth See admin instructions. 4 times a week     ferrous sulfate 325 (65 FE) MG tablet Take 325 mg by mouth daily with breakfast.     fexofenadine (ALLEGRA) 180 MG tablet Take 180 mg by mouth daily.     fluticasone (FLONASE) 50 MCG/ACT  nasal spray Place 2 sprays into both nostrils daily.     HYDROcodone-acetaminophen (NORCO/VICODIN) 5-325 MG tablet Take 0.5 tablets by mouth daily as needed (Knee/Back pain).     HYDROcodone-homatropine (HYCODAN) 5-1.5 MG/5ML syrup Take 5 mLs by mouth daily as needed for cough.     lidocaine (LIDODERM) 5 % 1 patch daily.     loperamide (IMODIUM) 2 MG capsule Take 2-4 mg by mouth every 6 (six) hours as needed.     LORazepam (ATIVAN) 0.5 MG tablet Take 0.5 mg by mouth at bedtime.      magic mouthwash SOLN 5 to 10 ml swish and spit or  swallow QID prn mouth and esophageal pain 240 mL 2   Magnesium-Potassium 250-100 MG TABS Take 1 tablet by mouth daily.     melatonin 5 MG TABS Take 5 mg by mouth at bedtime.     metoprolol succinate (TOPROL-XL) 50 MG 24 hr tablet Take 50 mg by mouth daily.      MYRBETRIQ 50 MG TB24 tablet Take 50 mg by mouth daily.     osimertinib mesylate (TAGRISSO) 80 MG tablet TAKE 1 TABLET BY MOUTH DAILY. 30 tablet 2   oxymetazoline (AFRIN) 0.05 % nasal spray Place 1 spray into both nostrils daily as needed for congestion.     phenazopyridine (PYRIDIUM) 200 MG tablet Take 200 mg by mouth daily as needed.     RABEprazole (ACIPHEX) 20 MG tablet Take 1 tablet (20 mg total) by mouth 2 (two) times a week.     rosuvastatin (CRESTOR) 5 MG tablet Take 5 mg by mouth daily.     SYNTHROID 112 MCG tablet Take 112 mcg by mouth daily before breakfast.      vitamin B-12 (CYANOCOBALAMIN) 1000 MCG tablet Take 1,000 mcg by mouth daily.     No current facility-administered medications for this visit.    SURGICAL HISTORY:  Past Surgical History:  Procedure Laterality Date   APPENDECTOMY     BRONCHIAL BIOPSY  10/20/2019   Procedure: BRONCHIAL BIOPSIES;  Surgeon: Collene Gobble, MD;  Location: [facility] ENDOSCOPY;  Service: Pulmonary;;   BRONCHIAL BRUSHINGS  10/20/2019   Procedure: BRONCHIAL BRUSHINGS;  Surgeon: Collene Gobble, MD;  Location: [facility] ENDOSCOPY;  Service: Pulmonary;;   BRONCHIAL NEEDLE ASPIRATION BIOPSY  10/20/2019   Procedure: BRONCHIAL NEEDLE ASPIRATION BIOPSIES;  Surgeon: Collene Gobble, MD;  Location: [facility] ENDOSCOPY;  Service: Pulmonary;;   BRONCHIAL WASHINGS  10/20/2019   Procedure: BRONCHIAL WASHINGS;  Surgeon: Collene Gobble, MD;  Location: [facility] ENDOSCOPY;  Service: Pulmonary;;   CARPAL TUNNEL RELEASE Right 2011   Dr. Amedeo Plenty   COLPORRHAPHY     posterior   HAND SURGERY Right 2016   Nerve surgery, Dr. Amedeo Plenty   OVARIAN CYST REMOVAL     RECTOCELE REPAIR  2011   w/TVH and sling   TONSILLECTOMY     TONSILLECTOMY      TOTAL KNEE ARTHROPLASTY Left 09/09/2018   Procedure: TOTAL KNEE ARTHROPLASTY, CORTISONE INJECTION RIGHT KNEE;  Surgeon: Paralee Cancel, MD;  Location: [facility] ORS;  Service: Orthopedics;  Laterality: Left;  70 mins   TOTAL VAGINAL HYSTERECTOMY  10/18/2009   rectocele repair, sling   TUBAL LIGATION Bilateral    VARICOSE VEIN SURGERY     VIDEO BRONCHOSCOPY WITH ENDOBRONCHIAL NAVIGATION N/A 10/20/2019   Procedure: VIDEO BRONCHOSCOPY WITH ENDOBRONCHIAL NAVIGATION;  Surgeon: Collene Gobble, MD;  Location: [facility] ENDOSCOPY;  Service: Pulmonary;  Laterality: N/A;    REVIEW OF SYSTEMS:  Constitutional: negative Eyes: negative  Ears, nose, mouth, throat, and face: negative Respiratory: positive for cough Cardiovascular: negative Gastrointestinal: negative Genitourinary:negative Integument/breast: negative Hematologic/lymphatic: negative Musculoskeletal:negative Neurological: negative Behavioral/Psych: negative Endocrine: negative Allergic/Immunologic: negative   PHYSICAL EXAMINATION: General appearance: alert, cooperative, fatigued, and no distress Head: Normocephalic, without obvious abnormality, atraumatic Neck: no adenopathy, no JVD, supple, symmetrical, trachea midline, and thyroid not enlarged, symmetric, no tenderness/mass/nodules Lymph nodes: Cervical, supraclavicular, and axillary nodes normal. Resp: clear to auscultation bilaterally Back: symmetric, no curvature. ROM normal. No CVA tenderness. Cardio: regular rate and rhythm, S1, S2 normal, no murmur, click, rub or gallop GI: soft, non-tender; bowel sounds normal; no masses,  no organomegaly Extremities: extremities normal, atraumatic, no cyanosis or edema Neurologic: Alert and oriented X 3, normal strength and tone. Normal symmetric reflexes. Normal coordination and gait  ECOG PERFORMANCE STATUS: 1 - Symptomatic but completely ambulatory  Blood pressure 118/82, pulse (!) 115, temperature 97.8 F (36.6 C), weight 159 lb (72.1 kg), last  menstrual period 08/21/2002, SpO2 98 %.  LABORATORY DATA: Lab Results  Component Value Date   WBC 4.4 10/05/2021   HGB 12.0 10/05/2021   HCT 34.8 (L) 10/05/2021   MCV 91.8 10/05/2021   PLT 126 (L) 10/05/2021      Chemistry      Component Value Date/Time   NA 140 10/05/2021 1417   K 4.5 10/05/2021 1417   CL 107 10/05/2021 1417   CO2 27 10/05/2021 1417   BUN 15 10/05/2021 1417   CREATININE 1.11 (H) 10/05/2021 1417      Component Value Date/Time   CALCIUM 9.1 10/05/2021 1417   ALKPHOS 74 10/05/2021 1417   AST 16 10/05/2021 1417   ALT 15 10/05/2021 1417   BILITOT 0.4 10/05/2021 1417       RADIOGRAPHIC STUDIES: CT Chest W Contrast  Result Date: 10/06/2021 CLINICAL DATA:  Primary Cancer Type: Lung Imaging Indication: Assess response to therapy Interval therapy since last imaging? Yes Initial Cancer Diagnosis Date: 10/20/2019; Established by: Biopsy-proven Detailed Pathology: Stage IIIc non-small cell lung cancer, adenocarcinoma. Primary Tumor location:  Left upper lobe. Surgeries: No thoracic. Chemotherapy: Yes; Ongoing? Yes; Tagrisso daily Immunotherapy? No Radiation therapy? Yes; Date Range: 11/26/2019 - 01/06/2020; Target: Left lung * Tracking Code: BO * EXAM: CT CHEST WITH CONTRAST TECHNIQUE: Multidetector CT imaging of the chest was performed during intravenous contrast administration. RADIATION DOSE REDUCTION: This exam was performed according to the departmental dose-optimization program which includes automated exposure control, adjustment of the mA and/or kV according to patient size and/or use of iterative reconstruction technique. CONTRAST:  72m OMNIPAQUE IOHEXOL 300 MG/ML  SOLN COMPARISON:  Most recent CT chest 06/06/2021.  11/02/2019 PET-CT. FINDINGS: Cardiovascular: No significant vascular findings. Normal heart size. No pericardial effusion. Mediastinum/Nodes: No mediastinal adenopathy. No axillary supraclavicular adenopathy. Moderate size hiatal hernia posterior to the  heart. Low-density collection adjacent to the hiatal hernia within the medial middle mediastinum has simple fluid attenuation and unchanged from prior. Lungs/Pleura: Posterior collapse of the LEFT upper lobe is unchanged from comparison exam. No new nodularity. Perihilar consolidation RIGHT lung is also unchanged. Lung findings consistent with post radiation change. Upper Abdomen: Limited view of the liver, kidneys, pancreas are unremarkable. Normal adrenal glands. Musculoskeletal: No aggressive osseous lesion. Schmorl's nodes in the lower thoracic spine. Densely sclerotic lesion in the T9 vertebral body is unchanged most consistent benign bone island IMPRESSION: 1. Post radiation change in the LEFT upper lobe stable compared to prior. No new or suspicious pulmonary nodules. 2. No mediastinal adenopathy. 3. Moderate size hiatal  hernia. 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 No interval change. Electronically Signed   By: Suzy Bouchard M.D.   On: 10/06/2021 11:12    ASSESSMENT AND PLAN: This is a very pleasant 71 years old white female recently diagnosed with a stage IIIc/IV (T3, N3, M0/M1a) non-small cell lung cancer, adenocarcinoma presented with large left upper lobe lung mass in addition to left infrahilar, right hilar and subcarinal lymphadenopathy and small left pleural effusion diagnosed in August 2021 with positive EGFR mutation in exon 21 (L858R). The patient completed a course of concurrent chemoradiation with weekly carboplatin and paclitaxel status post 7 cycles.  She tolerated her treatment well except for fatigue as well as a skin burn and cough. Her scan showed mild improvement of the left upper lobe lung mass as well as the mediastinal lymphadenopathy.  The patient continues to have small left pleural effusion that is suspicious given her presentation. She is currently on treatment with Tagrisso 80 mg p.o. daily started on  January 31, 2020.  She is status post 20 months of treatment.   The patient has been tolerating her treatment with Tagrisso fairly well with no significant adverse effect except for mild skin rash. She had repeat CT scan of the chest performed recently.  I personally and independently reviewed the scan and discussed the result with the patient today. Her scan showed no concerning findings for disease progression. I recommended for her to continue with her current treatment with Tagrisso with the same dose. I will see her back for follow-up visit in 3 months for evaluation and repeat blood work. We will consider changing her staging work-up to every 6 months as long as the patient does not have any concerning findings for disease progression. She was advised to call immediately if she has any other concerning symptoms in the interval. The patient voices understanding of current disease status and treatment options and is in agreement with the current care plan.  All questions were answered. The patient knows to call the clinic with any problems, questions or concerns. We can certainly see the patient much sooner if necessary.  Disclaimer: This note was dictated with voice recognition software. Similar sounding words can inadvertently be transcribed and may not be corrected upon review.

## 2021-10-10 NOTE — Addendum Note (Signed)
Addended by: Ardeen Garland on: 10/10/2021 03:08 PM   Modules accepted: Orders

## 2021-10-16 ENCOUNTER — Other Ambulatory Visit: Payer: Self-pay | Admitting: Internal Medicine

## 2021-10-16 ENCOUNTER — Other Ambulatory Visit (HOSPITAL_COMMUNITY): Payer: Self-pay

## 2021-10-16 DIAGNOSIS — C3492 Malignant neoplasm of unspecified part of left bronchus or lung: Secondary | ICD-10-CM

## 2021-10-16 MED ORDER — OSIMERTINIB MESYLATE 80 MG PO TABS
ORAL_TABLET | Freq: Every day | ORAL | 2 refills | Status: DC
  Filled 2021-10-16: qty 30, 30d supply, fill #0
  Filled 2021-11-14: qty 30, 30d supply, fill #1
  Filled 2021-12-15: qty 30, 30d supply, fill #2

## 2021-10-17 ENCOUNTER — Telehealth: Payer: Self-pay | Admitting: Internal Medicine

## 2021-10-17 ENCOUNTER — Other Ambulatory Visit (HOSPITAL_COMMUNITY): Payer: Self-pay

## 2021-10-17 NOTE — Telephone Encounter (Signed)
Called patient regarding upcoming November appointment, patient is notified.  

## 2021-10-24 ENCOUNTER — Other Ambulatory Visit (HOSPITAL_COMMUNITY): Payer: Self-pay

## 2021-10-24 DIAGNOSIS — R059 Cough, unspecified: Secondary | ICD-10-CM | POA: Diagnosis not present

## 2021-10-24 DIAGNOSIS — F419 Anxiety disorder, unspecified: Secondary | ICD-10-CM | POA: Diagnosis not present

## 2021-10-24 DIAGNOSIS — M25561 Pain in right knee: Secondary | ICD-10-CM | POA: Diagnosis not present

## 2021-10-24 DIAGNOSIS — N390 Urinary tract infection, site not specified: Secondary | ICD-10-CM | POA: Diagnosis not present

## 2021-10-24 DIAGNOSIS — R350 Frequency of micturition: Secondary | ICD-10-CM | POA: Diagnosis not present

## 2021-11-03 DIAGNOSIS — H524 Presbyopia: Secondary | ICD-10-CM | POA: Diagnosis not present

## 2021-11-14 ENCOUNTER — Other Ambulatory Visit (HOSPITAL_COMMUNITY): Payer: Self-pay

## 2021-11-23 ENCOUNTER — Other Ambulatory Visit (HOSPITAL_COMMUNITY): Payer: Self-pay

## 2021-12-15 ENCOUNTER — Other Ambulatory Visit (HOSPITAL_COMMUNITY): Payer: Self-pay

## 2021-12-20 ENCOUNTER — Other Ambulatory Visit (HOSPITAL_COMMUNITY): Payer: Self-pay

## 2021-12-21 ENCOUNTER — Other Ambulatory Visit (HOSPITAL_COMMUNITY): Payer: Self-pay

## 2021-12-29 ENCOUNTER — Telehealth: Payer: Self-pay | Admitting: Internal Medicine

## 2021-12-29 NOTE — Telephone Encounter (Signed)
Called patient regarding rescheduled 11/27 appointment to 11/21 per provider pal, patient has been called and voicemail was left.

## 2021-12-30 NOTE — Progress Notes (Unsigned)
Cardiology Office Note   Date:  01/01/2022   ID:  Linda Woods, DOB August 07, 1950, MRN [id number]  PCP:  [name], MD    No chief complaint on file.  Coronary artery calcification  Wt Readings from Last 3 Encounters:  01/01/22 165 lb (74.8 kg)  10/09/21 159 lb (72.1 kg)  08/07/21 165 lb 11.2 oz (75.2 kg)       History of Present Illness: Linda Woods is a 71 y.o. female who was referred to cardiology by oncology after coronary artery calcification was noted on a prior CT.  Records from her initial visit in May 2023 show: "Records from oncology show: "Stage IIIc (T3, N3, M0) non-small cell lung cancer, adenocarcinoma presented with large left upper lobe lung mass in addition to left infrahilar and right hilar and subcarinal lymphadenopathy diagnosed in August 2021."   "For the coronary artery disease documented on the CT scan of the chest, the patient was seen by cardiology in the past but not in the last few years.  I will make a referral for her to see a cardiologist with [facility]."   She was seen in [facility] cardi[facility] care back in 2016.  High triglycerides were noted.   2023 chest CT showed: "Cardiovascular: Heart size is normal. There is no significant pericardial fluid, thickening or pericardial calcification. There is aortic atherosclerosis, as well as atherosclerosis of the great vessels of the mediastinum and the coronary arteries, including calcified atherosclerotic plaque in the right coronary artery."   SHe has some SHOB when she leans over and has some fatigue.  Has some difficulty with inclines. "  She and her husband on [place].  Echocardiogram was done in 5/23 showing normal LV/RV/valvular function.  She was trying to get more activity to improve stamina.  She was taking a statin for her aortic atherosclerosis and hyperlipidemia.  Denies : Chest pain. Dizziness. Leg edema. Nitroglycerin use. Orthopnea.  Palpitations. Paroxysmal nocturnal dyspnea.  Syncope.    Mild DOE with hills and stairs, but unchanged.  Elliptical used at the gym.    Walks outside with her dog.   Past Medical History:  Diagnosis Date   Anemia    as a child   Anxiety    Asthma 10/19/2020   Bursitis    Chronic reflux esophagitis    Complication of anesthesia    Diarrhea, functional    Diverticulosis    GERD (gastroesophageal reflux disease)    History of kidney stones    Hypertension    Knee pain    right knee-seeing ortho   lung ca 09/2019   Osteoarthritis    Plantar fasciitis    PONV (postoperative nausea and vomiting)    has used the patch before and that helps   Pure hypercholesterolemia    RLS (restless legs syndrome)    Superficial vein thrombosis    Thyroid disease    hypothyroid    Past Surgical History:  Procedure Laterality Date   APPENDECTOMY     BRONCHIAL BIOPSY  10/20/2019   Procedure: BRONCHIAL BIOPSIES;  Surgeon: Collene Gobble, MD;  Location: [facility] ENDOSCOPY;  Service: Pulmonary;;   BRONCHIAL BRUSHINGS  10/20/2019   Procedure: BRONCHIAL BRUSHINGS;  Surgeon: Collene Gobble, MD;  Location: [facility] ENDOSCOPY;  Service: Pulmonary;;   BRONCHIAL NEEDLE ASPIRATION BIOPSY  10/20/2019   Procedure: BRONCHIAL NEEDLE ASPIRATION BIOPSIES;  Surgeon: Collene Gobble, MD;  Location: [facility] ENDOSCOPY;  Service: Pulmonary;;   BRONCHIAL WASHINGS  10/20/2019  Procedure: BRONCHIAL WASHINGS;  Surgeon: Collene Gobble, MD;  Location: [facility] ENDOSCOPY;  Service: Pulmonary;;   CARPAL TUNNEL RELEASE Right 2011   Dr. Amedeo Plenty   COLPORRHAPHY     posterior   HAND SURGERY Right 2016   Nerve surgery, Dr. Amedeo Plenty   OVARIAN CYST REMOVAL     RECTOCELE REPAIR  2011   w/TVH and sling   TONSILLECTOMY     TONSILLECTOMY     TOTAL KNEE ARTHROPLASTY Left 09/09/2018   Procedure: TOTAL KNEE ARTHROPLASTY, CORTISONE INJECTION RIGHT KNEE;  Surgeon: Paralee Cancel, MD;  Location: [facility] ORS;  Service: Orthopedics;  Laterality: Left;  70 mins    TOTAL VAGINAL HYSTERECTOMY  10/18/2009   rectocele repair, sling   TUBAL LIGATION Bilateral    VARICOSE VEIN SURGERY     VIDEO BRONCHOSCOPY WITH ENDOBRONCHIAL NAVIGATION N/A 10/20/2019   Procedure: VIDEO BRONCHOSCOPY WITH ENDOBRONCHIAL NAVIGATION;  Surgeon: Collene Gobble, MD;  Location: [place] ENDOSCOPY;  Service: Pulmonary;  Laterality: N/A;     Current Outpatient Medications  Medication Sig Dispense Refill   albuterol (VENTOLIN HFA) 108 (90 Base) MCG/ACT inhaler Inhale 2 puffs into the lungs every 6 (six) hours as needed for wheezing or shortness of breath. 8.5 g 0   benzonatate (TESSALON) 100 MG capsule Take 100 mg by mouth 3 (three) times daily as needed for cough.     cholecalciferol (VITAMIN D3) 25 MCG (1000 UNIT) tablet Take 1,000 Units by mouth daily. Pt states she take 2000 units/day     cyclobenzaprine (FLEXERIL) 10 MG tablet Take 10 mg by mouth 3 (three) times daily as needed for muscle spasms.     famotidine (PEPCID) 40 MG tablet Take 40 mg by mouth See admin instructions. 4 times a week     ferrous sulfate 325 (65 FE) MG tablet Take 325 mg by mouth daily with breakfast.     fexofenadine (ALLEGRA) 180 MG tablet Take 180 mg by mouth daily.     fluticasone (FLONASE) 50 MCG/ACT nasal spray Place 2 sprays into both nostrils as needed for allergies.     HYDROcodone-acetaminophen (NORCO/VICODIN) 5-325 MG tablet Take 0.5 tablets by mouth daily as needed (Knee/Back pain).     HYDROcodone-homatropine (HYCODAN) 5-1.5 MG/5ML syrup Take 5 mLs by mouth daily as needed for cough.     lidocaine (LIDODERM) 5 % 1 patch daily.     loperamide (IMODIUM) 2 MG capsule Take 2-4 mg by mouth every 6 (six) hours as needed.     LORazepam (ATIVAN) 0.5 MG tablet Take 0.5 mg by mouth at bedtime.      Magnesium-Potassium 250-100 MG TABS Take 1 tablet by mouth daily.     melatonin 5 MG TABS Take 5 mg by mouth at bedtime.     metoprolol succinate (TOPROL-XL) 50 MG 24 hr tablet Take 50 mg by mouth daily.       MYRBETRIQ 50 MG TB24 tablet Take 50 mg by mouth daily.     osimertinib mesylate (TAGRISSO) 80 MG tablet TAKE 1 TABLET BY MOUTH DAILY. 30 tablet 2   oxymetazoline (AFRIN) 0.05 % nasal spray Place 1 spray into both nostrils daily as needed for congestion.     phenazopyridine (PYRIDIUM) 200 MG tablet Take 200 mg by mouth daily.     PREMARIN vaginal cream Place vaginally.     RABEprazole (ACIPHEX) 20 MG tablet Take 1 tablet (20 mg total) by mouth 2 (two) times a week.     rosuvastatin (CRESTOR) 5 MG tablet Take 5 mg  by mouth daily.     SYNTHROID 112 MCG tablet Take 112 mcg by mouth daily before breakfast.      vitamin B-12 (CYANOCOBALAMIN) 1000 MCG tablet Take 1,000 mcg by mouth daily.     magic mouthwash SOLN 5 to 10 ml swish and spit or swallow QID prn mouth and esophageal pain (Patient not taking: Reported on 01/01/2022) 240 mL 2   No current facility-administered medications for this visit.    Allergies:   Patient has no known allergies.    Social History:  The patient  reports that she has never smoked. She has never used smokeless tobacco. She reports current alcohol use of about 2.0 - 3.0 standard drinks of alcohol per week. She reports that she does not use drugs.   Family History:  The patient's family history includes Breast cancer in her maternal aunt and paternal aunt; Cancer in her brother; Cervical cancer in her mother; Diabetes in her father; Heart failure in her father and mother.    ROS:  Please see the history of present illness.   Otherwise, review of systems are positive for bad knee.   All other systems are reviewed and negative.    PHYSICAL EXAM: VS:  BP 118/84   Pulse 99   Ht 5' 5.5" (1.664 m)   Wt 165 lb (74.8 kg)   LMP 08/21/2002   SpO2 98%   BMI 27.04 kg/m  , BMI Body mass index is 27.04 kg/m. GEN: Well nourished, well developed, in no acute distress HEENT: normal Neck: no JVD, carotid bruits, or masses Cardiac: RRR; no murmurs, rubs, or gallops,no edema   Respiratory:  clear to auscultation bilaterally, normal work of breathing GI: soft, nontender, nondistended, + BS MS: no deformity or atrophy Skin: warm and dry, no rash Neuro:  Strength and sensation are intact Psych: euthymic mood, full affect   EKG:   The ekg ordered today demonstrates NSR, nonspecific ST changes   Recent Labs: 10/05/2021: ALT 15; BUN 15; Creatinine 1.11; Hemoglobin 12.0; Platelet Count 126; Potassium 4.5; Sodium 140   Lipid Panel No results found for: "CHOL", "TRIG", "HDL", "CHOLHDL", "VLDL", "LDLCALC", "LDLDIRECT"   Other studies Reviewed: Additional studies/ records that were reviewed today with results demonstrating: labs reviewed; Cr 1.1.     ASSESSMENT AND PLAN:  Coronary artery calcification:.  No angina.  Continue aggressive secondary prevention.  Aortic atherosclerosis:  Minimizes red meat.  Eats greens.  Gets protein with nuts and beans.  Continue low dose Crestor.   Hyperlipidemia: Followed by primary care doctor.  LDL improved per her report.   DOE: stable.  Hoping to avoid TKR.  Consider water aerobics.     Current medicines are reviewed at length with the patient today.  The patient concerns regarding her medicines were addressed.  The following changes have been made:  No change  Labs/ tests ordered today include:  No orders of the defined types were placed in this encounter.   Recommend 150 minutes/week of aerobic exercise Low fat, low carb, high fiber diet recommended  Disposition:   FU in 1 year   Signed, Larae Grooms, MD  01/01/2022 10:32 AM    [facility] [address], [place], [place]  [address] Phone: [phone]; Fax: [phone]

## 2022-01-01 ENCOUNTER — Ambulatory Visit: Payer: Medicare Other | Attending: Interventional Cardiology | Admitting: Interventional Cardiology

## 2022-01-01 VITALS — BP 118/84 | HR 99 | Ht 65.5 in | Wt 165.0 lb

## 2022-01-01 DIAGNOSIS — R0602 Shortness of breath: Secondary | ICD-10-CM | POA: Diagnosis not present

## 2022-01-01 DIAGNOSIS — E782 Mixed hyperlipidemia: Secondary | ICD-10-CM | POA: Diagnosis not present

## 2022-01-01 DIAGNOSIS — I2584 Coronary atherosclerosis due to calcified coronary lesion: Secondary | ICD-10-CM

## 2022-01-01 DIAGNOSIS — I2510 Atherosclerotic heart disease of native coronary artery without angina pectoris: Secondary | ICD-10-CM | POA: Diagnosis not present

## 2022-01-01 DIAGNOSIS — I700 Atherosclerosis of aorta: Secondary | ICD-10-CM

## 2022-01-01 NOTE — Patient Instructions (Signed)
Medication Instructions:  Your physician recommends that you continue on your current medications as directed. Please refer to the Current Medication list given to you today.  *If you need a refill on your cardiac medications before your next appointment, please call your pharmacy*   Lab Work: none If you have labs (blood work) drawn today and your tests are completely normal, you will receive your results only by: MyChart Message (if you have MyChart) OR A paper copy in the mail If you have any lab test that is abnormal or we need to change your treatment, we will call you to review the results.   Testing/Procedures: none   Follow-Up: At [facility], you and your health needs are our priority.  As part of our continuing mission to provide you with exceptional heart care, we have created designated Provider Care Teams.  These Care Teams include your primary Cardiologist (physician) and Advanced Practice Providers (APPs -  Physician Assistants and Nurse Practitioners) who all work together to provide you with the care you need, when you need it.  We recommend signing up for the patient portal called "MyChart".  Sign up information is provided on this After Visit Summary.  MyChart is used to connect with patients for Virtual Visits (Telemedicine).  Patients are able to view lab/test results, encounter notes, upcoming appointments, etc.  Non-urgent messages can be sent to your provider as well.   To learn more about what you can do with MyChart, go to [email or web address].    Your next appointment:   12 month(s)  The format for your next appointment:   In Person  Provider:   Jayadeep Varanasi, MD     Other Instructions    Important Information About Sugar       

## 2022-01-09 ENCOUNTER — Inpatient Hospital Stay: Payer: Medicare Other | Attending: Internal Medicine

## 2022-01-09 ENCOUNTER — Other Ambulatory Visit: Payer: Self-pay

## 2022-01-09 ENCOUNTER — Inpatient Hospital Stay: Payer: Medicare Other | Admitting: Internal Medicine

## 2022-01-09 ENCOUNTER — Other Ambulatory Visit (HOSPITAL_COMMUNITY): Payer: Self-pay

## 2022-01-09 VITALS — BP 106/61 | HR 83 | Temp 98.2°F | Resp 16 | Ht 65.5 in | Wt 163.1 lb

## 2022-01-09 DIAGNOSIS — D649 Anemia, unspecified: Secondary | ICD-10-CM | POA: Insufficient documentation

## 2022-01-09 DIAGNOSIS — E039 Hypothyroidism, unspecified: Secondary | ICD-10-CM | POA: Diagnosis not present

## 2022-01-09 DIAGNOSIS — J90 Pleural effusion, not elsewhere classified: Secondary | ICD-10-CM | POA: Diagnosis not present

## 2022-01-09 DIAGNOSIS — R21 Rash and other nonspecific skin eruption: Secondary | ICD-10-CM | POA: Diagnosis not present

## 2022-01-09 DIAGNOSIS — C3490 Malignant neoplasm of unspecified part of unspecified bronchus or lung: Secondary | ICD-10-CM | POA: Diagnosis not present

## 2022-01-09 DIAGNOSIS — C3412 Malignant neoplasm of upper lobe, left bronchus or lung: Secondary | ICD-10-CM | POA: Insufficient documentation

## 2022-01-09 DIAGNOSIS — C3492 Malignant neoplasm of unspecified part of left bronchus or lung: Secondary | ICD-10-CM

## 2022-01-09 LAB — CBC WITH DIFFERENTIAL (CANCER CENTER ONLY)
Abs Immature Granulocytes: 0.01 10*3/uL (ref 0.00–0.07)
Basophils Absolute: 0 10*3/uL (ref 0.0–0.1)
Basophils Relative: 0 %
Eosinophils Absolute: 0 10*3/uL (ref 0.0–0.5)
Eosinophils Relative: 1 %
HCT: 34.8 % — ABNORMAL LOW (ref 36.0–46.0)
Hemoglobin: 11.8 g/dL — ABNORMAL LOW (ref 12.0–15.0)
Immature Granulocytes: 0 %
Lymphocytes Relative: 12 %
Lymphs Abs: 0.5 10*3/uL — ABNORMAL LOW (ref 0.7–4.0)
MCH: 32.2 pg (ref 26.0–34.0)
MCHC: 33.9 g/dL (ref 30.0–36.0)
MCV: 95.1 fL (ref 80.0–100.0)
Monocytes Absolute: 0.1 10*3/uL (ref 0.1–1.0)
Monocytes Relative: 3 %
Neutro Abs: 3.6 10*3/uL (ref 1.7–7.7)
Neutrophils Relative %: 84 %
Platelet Count: 111 10*3/uL — ABNORMAL LOW (ref 150–400)
RBC: 3.66 MIL/uL — ABNORMAL LOW (ref 3.87–5.11)
RDW: 13.1 % (ref 11.5–15.5)
WBC Count: 4.3 10*3/uL (ref 4.0–10.5)
nRBC: 0 % (ref 0.0–0.2)

## 2022-01-09 LAB — CMP (CANCER CENTER ONLY)
ALT: 13 U/L (ref 0–44)
AST: 18 U/L (ref 15–41)
Albumin: 4.4 g/dL (ref 3.5–5.0)
Alkaline Phosphatase: 74 U/L (ref 38–126)
Anion gap: 6 (ref 5–15)
BUN: 15 mg/dL (ref 8–23)
CO2: 27 mmol/L (ref 22–32)
Calcium: 9.6 mg/dL (ref 8.9–10.3)
Chloride: 107 mmol/L (ref 98–111)
Creatinine: 1.09 mg/dL — ABNORMAL HIGH (ref 0.44–1.00)
GFR, Estimated: 55 mL/min — ABNORMAL LOW (ref >60)
Glucose, Bld: 158 mg/dL — ABNORMAL HIGH (ref 70–99)
Potassium: 4.8 mmol/L (ref 3.5–5.1)
Sodium: 140 mmol/L (ref 135–145)
Total Bilirubin: 0.3 mg/dL (ref 0.3–1.2)
Total Protein: 6.6 g/dL (ref 6.5–8.1)

## 2022-01-09 NOTE — Progress Notes (Signed)
[facility] [phone]   [phone]  OFFICE PROGRESS NOTE  [name], MD [place] [address]  DIAGNOSIS: Stage IIIc (T3, N3, M0) non-small cell lung cancer, adenocarcinoma presented with large left upper lobe lung mass in addition to left infrahilar and right hilar and subcarinal lymphadenopathy diagnosed in August 2021.   Molecular Biomarkers: Insufficient tissue for foundation 1. Guardant 360 results.  DETECTED ALTERATION(S) / BIOMARKER(S) % CFDNA OR AMPLIFICATION ASSOCIATED FDA-APPROVED THERAPIES CLINICAL TRIAL AVAILABILITY  EGFR L858R, 1.2%, Afatinib, Dacomitinib, Erlotinib, Gefitinib, Osimertinib, Ramucirumab Yes [id number], 1.0%, Afatinib, Dacomitinib, Erlotinib, Gefitinib, Osimertinib, Ramucirumab Yes [id number] 1.0% None  No   PRIOR THERAPY: Weekly concurrent chemoradiation with carboplatin for an AUC of 2 and paclitaxel 45 mg/m2.  First dose expected on 11/16/2019. Status post 7 cycles.   CURRENT THERAPY: Tagrisso 80 mg p.o. daily. First dose started 01/31/2020.  Status post 23 months of treatment.  INTERVAL HISTORY: Linda Woods 71 y.o. female returns to the clinic today for follow-up visit.  The patient is feeling fine today with no concerning complaints except for mild skin rash that increased recently.  She also continues to have some nail changes and occasional episodes of diarrhea.  She has no current chest pain, shortness of breath, cough or hemoptysis.  She has no nausea, vomiting, abdominal pain or constipation.  She has no headache or visual changes.  She continues to tolerate her treatment with Tagrisso fairly well.  She is here today for evaluation and repeat blood work.  MEDICAL HISTORY: Past Medical History:  Diagnosis Date   Anemia    as a child   Anxiety    Asthma 10/19/2020   Bursitis    Chronic reflux esophagitis    Complication of anesthesia    Diarrhea, functional     Diverticulosis    GERD (gastroesophageal reflux disease)    History of kidney stones    Hypertension    Knee pain    right knee-seeing ortho   lung ca 09/2019   Osteoarthritis    Plantar fasciitis    PONV (postoperative nausea and vomiting)    has used the patch before and that helps   Pure hypercholesterolemia    RLS (restless legs syndrome)    Superficial vein thrombosis    Thyroid disease    hypothyroid    ALLERGIES:  has No Known Allergies.  MEDICATIONS:  Current Outpatient Medications  Medication Sig Dispense Refill   albuterol (VENTOLIN HFA) 108 (90 Base) MCG/ACT inhaler Inhale 2 puffs into the lungs every 6 (six) hours as needed for wheezing or shortness of breath. 8.5 g 0   benzonatate (TESSALON) 100 MG capsule Take 100 mg by mouth 3 (three) times daily as needed for cough.     cholecalciferol (VITAMIN D3) 25 MCG (1000 UNIT) tablet Take 1,000 Units by mouth daily. Pt states she take 2000 units/day     cyclobenzaprine (FLEXERIL) 10 MG tablet Take 10 mg by mouth 3 (three) times daily as needed for muscle spasms.     famotidine (PEPCID) 40 MG tablet Take 40 mg by mouth See admin instructions. 4 times a week     ferrous sulfate 325 (65 FE) MG tablet Take 325 mg by mouth daily with breakfast.     fexofenadine (ALLEGRA) 180 MG tablet Take 180 mg by mouth daily.     fluticasone (FLONASE) 50 MCG/ACT nasal spray Place 2 sprays into both nostrils as needed for allergies.  HYDROcodone-acetaminophen (NORCO/VICODIN) 5-325 MG tablet Take 0.5 tablets by mouth daily as needed (Knee/Back pain).     HYDROcodone-homatropine (HYCODAN) 5-1.5 MG/5ML syrup Take 5 mLs by mouth daily as needed for cough.     lidocaine (LIDODERM) 5 % 1 patch daily.     loperamide (IMODIUM) 2 MG capsule Take 2-4 mg by mouth every 6 (six) hours as needed.     LORazepam (ATIVAN) 0.5 MG tablet Take 0.5 mg by mouth at bedtime.      magic mouthwash SOLN 5 to 10 ml swish and spit or swallow QID prn mouth and esophageal  pain (Patient not taking: Reported on 01/01/2022) 240 mL 2   Magnesium-Potassium 250-100 MG TABS Take 1 tablet by mouth daily.     melatonin 5 MG TABS Take 5 mg by mouth at bedtime.     metoprolol succinate (TOPROL-XL) 50 MG 24 hr tablet Take 50 mg by mouth daily.      MYRBETRIQ 50 MG TB24 tablet Take 50 mg by mouth daily.     osimertinib mesylate (TAGRISSO) 80 MG tablet TAKE 1 TABLET BY MOUTH DAILY. 30 tablet 2   oxymetazoline (AFRIN) 0.05 % nasal spray Place 1 spray into both nostrils daily as needed for congestion.     phenazopyridine (PYRIDIUM) 200 MG tablet Take 200 mg by mouth daily.     PREMARIN vaginal cream Place vaginally.     RABEprazole (ACIPHEX) 20 MG tablet Take 1 tablet (20 mg total) by mouth 2 (two) times a week.     rosuvastatin (CRESTOR) 5 MG tablet Take 5 mg by mouth daily.     SYNTHROID 112 MCG tablet Take 112 mcg by mouth daily before breakfast.      vitamin B-12 (CYANOCOBALAMIN) 1000 MCG tablet Take 1,000 mcg by mouth daily.     No current facility-administered medications for this visit.    SURGICAL HISTORY:  Past Surgical History:  Procedure Laterality Date   APPENDECTOMY     BRONCHIAL BIOPSY  10/20/2019   Procedure: BRONCHIAL BIOPSIES;  Surgeon: Collene Gobble, MD;  Location: [facility] ENDOSCOPY;  Service: Pulmonary;;   BRONCHIAL BRUSHINGS  10/20/2019   Procedure: BRONCHIAL BRUSHINGS;  Surgeon: Collene Gobble, MD;  Location: [facility] ENDOSCOPY;  Service: Pulmonary;;   BRONCHIAL NEEDLE ASPIRATION BIOPSY  10/20/2019   Procedure: BRONCHIAL NEEDLE ASPIRATION BIOPSIES;  Surgeon: Collene Gobble, MD;  Location: [facility] ENDOSCOPY;  Service: Pulmonary;;   BRONCHIAL WASHINGS  10/20/2019   Procedure: BRONCHIAL WASHINGS;  Surgeon: Collene Gobble, MD;  Location: [facility] ENDOSCOPY;  Service: Pulmonary;;   CARPAL TUNNEL RELEASE Right 2011   Dr. Amedeo Plenty   COLPORRHAPHY     posterior   HAND SURGERY Right 2016   Nerve surgery, Dr. Amedeo Plenty   OVARIAN CYST REMOVAL     RECTOCELE REPAIR  2011   w/TVH  and sling   TONSILLECTOMY     TONSILLECTOMY     TOTAL KNEE ARTHROPLASTY Left 09/09/2018   Procedure: TOTAL KNEE ARTHROPLASTY, CORTISONE INJECTION RIGHT KNEE;  Surgeon: Paralee Cancel, MD;  Location: [facility] ORS;  Service: Orthopedics;  Laterality: Left;  70 mins   TOTAL VAGINAL HYSTERECTOMY  10/18/2009   rectocele repair, sling   TUBAL LIGATION Bilateral    VARICOSE VEIN SURGERY     VIDEO BRONCHOSCOPY WITH ENDOBRONCHIAL NAVIGATION N/A 10/20/2019   Procedure: VIDEO BRONCHOSCOPY WITH ENDOBRONCHIAL NAVIGATION;  Surgeon: Collene Gobble, MD;  Location: [facility] ENDOSCOPY;  Service: Pulmonary;  Laterality: N/A;    REVIEW OF SYSTEMS:  A comprehensive review of  systems was negative except for: Gastrointestinal: positive for diarrhea Integument/breast: positive for rash   PHYSICAL EXAMINATION: General appearance: alert, cooperative, fatigued, and no distress Head: Normocephalic, without obvious abnormality, atraumatic Neck: no adenopathy, no JVD, supple, symmetrical, trachea midline, and thyroid not enlarged, symmetric, no tenderness/mass/nodules Lymph nodes: Cervical, supraclavicular, and axillary nodes normal. Resp: clear to auscultation bilaterally Back: symmetric, no curvature. ROM normal. No CVA tenderness. Cardio: regular rate and rhythm, S1, S2 normal, no murmur, click, rub or gallop GI: soft, non-tender; bowel sounds normal; no masses,  no organomegaly Extremities: extremities normal, atraumatic, no cyanosis or edema  ECOG PERFORMANCE STATUS: 1 - Symptomatic but completely ambulatory  Blood pressure 106/61, pulse 83, temperature 98.2 F (36.8 C), temperature source Oral, resp. rate 16, height 5' 5.5" (1.664 m), weight 163 lb 1.6 oz (74 kg), last menstrual period 08/21/2002, SpO2 97 %.  LABORATORY DATA: Lab Results  Component Value Date   WBC 4.3 01/09/2022   HGB 11.8 (L) 01/09/2022   HCT 34.8 (L) 01/09/2022   MCV 95.1 01/09/2022   PLT 111 (L) 01/09/2022      Chemistry      Component Value  Date/Time   NA 140 10/05/2021 1417   K 4.5 10/05/2021 1417   CL 107 10/05/2021 1417   CO2 27 10/05/2021 1417   BUN 15 10/05/2021 1417   CREATININE 1.11 (H) 10/05/2021 1417      Component Value Date/Time   CALCIUM 9.1 10/05/2021 1417   ALKPHOS 74 10/05/2021 1417   AST 16 10/05/2021 1417   ALT 15 10/05/2021 1417   BILITOT 0.4 10/05/2021 1417       RADIOGRAPHIC STUDIES: No results found.  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 The patient completed a course of concurrent chemoradiation with weekly carboplatin and paclitaxel status post 7 cycles.  She tolerated her treatment well except for fatigue as well as a skin burn and cough. Her scan showed mild improvement of the left upper lobe lung mass as well as the mediastinal lymphadenopathy.  The patient continues to have small left pleural effusion that is suspicious given her presentation. She is currently on treatment with Tagrisso 80 mg p.o. daily started on January 31, 2020.  She is status post 23 months of treatment.   The patient has been tolerating her treatment with Tagrisso fairly well except for mild skin rash, nail changes and few episodes of diarrhea that is manageable with Imodium. I recommended for the patient to continue her current treatment with Tagrisso with the same dose. I will see her back for follow-up visit in 3 months for evaluation and repeat CT scan of the chest for restaging of her disease. For the anemia she was advised to take oral iron tablets few times a week. The patient was advised to call immediately if she has any other concerning symptoms in the  interval. The patient voices understanding of current disease status and treatment options and is in agreement with the current care plan.  All questions were answered. The patient knows to call the clinic with any problems, questions or concerns. We can certainly see the patient much sooner if necessary.  Disclaimer: This note was dictated  with voice recognition software. Similar sounding words can inadvertently be transcribed and may not be corrected upon review.

## 2022-01-15 ENCOUNTER — Other Ambulatory Visit: Payer: Medicare Other

## 2022-01-15 ENCOUNTER — Ambulatory Visit: Payer: Medicare Other | Admitting: Internal Medicine

## 2022-01-22 ENCOUNTER — Other Ambulatory Visit: Payer: Self-pay | Admitting: Internal Medicine

## 2022-01-22 ENCOUNTER — Other Ambulatory Visit (HOSPITAL_COMMUNITY): Payer: Self-pay

## 2022-01-22 DIAGNOSIS — C3492 Malignant neoplasm of unspecified part of left bronchus or lung: Secondary | ICD-10-CM

## 2022-01-22 MED ORDER — OSIMERTINIB MESYLATE 80 MG PO TABS
ORAL_TABLET | Freq: Every day | ORAL | 2 refills | Status: DC
  Filled 2022-01-22: qty 30, 30d supply, fill #0
  Filled 2022-02-13: qty 30, 30d supply, fill #1
  Filled 2022-03-16: qty 30, 30d supply, fill #2

## 2022-01-23 ENCOUNTER — Other Ambulatory Visit (HOSPITAL_COMMUNITY): Payer: Self-pay

## 2022-01-24 ENCOUNTER — Other Ambulatory Visit (HOSPITAL_COMMUNITY): Payer: Self-pay

## 2022-01-31 DIAGNOSIS — M1711 Unilateral primary osteoarthritis, right knee: Secondary | ICD-10-CM | POA: Diagnosis not present

## 2022-02-13 ENCOUNTER — Other Ambulatory Visit (HOSPITAL_COMMUNITY): Payer: Self-pay

## 2022-02-14 ENCOUNTER — Other Ambulatory Visit: Payer: Self-pay

## 2022-02-15 ENCOUNTER — Other Ambulatory Visit (HOSPITAL_COMMUNITY): Payer: Self-pay

## 2022-02-15 ENCOUNTER — Other Ambulatory Visit: Payer: Self-pay

## 2022-03-08 ENCOUNTER — Other Ambulatory Visit (HOSPITAL_COMMUNITY): Payer: Self-pay

## 2022-03-10 ENCOUNTER — Encounter: Payer: Self-pay | Admitting: Internal Medicine

## 2022-03-16 ENCOUNTER — Other Ambulatory Visit: Payer: Self-pay

## 2022-03-16 ENCOUNTER — Other Ambulatory Visit (HOSPITAL_COMMUNITY): Payer: Self-pay

## 2022-03-16 ENCOUNTER — Encounter: Payer: Self-pay | Admitting: Internal Medicine

## 2022-03-19 ENCOUNTER — Other Ambulatory Visit (HOSPITAL_COMMUNITY): Payer: Self-pay

## 2022-03-22 DIAGNOSIS — C3492 Malignant neoplasm of unspecified part of left bronchus or lung: Secondary | ICD-10-CM | POA: Diagnosis not present

## 2022-03-22 DIAGNOSIS — Z79899 Other long term (current) drug therapy: Secondary | ICD-10-CM | POA: Diagnosis not present

## 2022-03-22 DIAGNOSIS — D84821 Immunodeficiency due to drugs: Secondary | ICD-10-CM | POA: Diagnosis not present

## 2022-03-22 DIAGNOSIS — I700 Atherosclerosis of aorta: Secondary | ICD-10-CM | POA: Diagnosis not present

## 2022-04-09 ENCOUNTER — Encounter (HOSPITAL_COMMUNITY): Payer: Self-pay

## 2022-04-09 ENCOUNTER — Ambulatory Visit (HOSPITAL_COMMUNITY)
Admission: RE | Admit: 2022-04-09 | Discharge: 2022-04-09 | Disposition: A | Discharge status: Home / Self Care | Payer: Medicare Other | Source: Ambulatory Visit | Attending: Internal Medicine | Admitting: Internal Medicine

## 2022-04-09 ENCOUNTER — Inpatient Hospital Stay: Payer: Medicare Other | Attending: Nurse Practitioner

## 2022-04-09 DIAGNOSIS — C3412 Malignant neoplasm of upper lobe, left bronchus or lung: Secondary | ICD-10-CM | POA: Insufficient documentation

## 2022-04-09 DIAGNOSIS — C3490 Malignant neoplasm of unspecified part of unspecified bronchus or lung: Secondary | ICD-10-CM | POA: Insufficient documentation

## 2022-04-09 DIAGNOSIS — J479 Bronchiectasis, uncomplicated: Secondary | ICD-10-CM | POA: Diagnosis not present

## 2022-04-09 LAB — CBC WITH DIFFERENTIAL (CANCER CENTER ONLY)
Abs Immature Granulocytes: 0.01 10*3/uL (ref 0.00–0.07)
Basophils Absolute: 0 10*3/uL (ref 0.0–0.1)
Basophils Relative: 0 %
Eosinophils Absolute: 0.2 10*3/uL (ref 0.0–0.5)
Eosinophils Relative: 5 %
HCT: 36.7 % (ref 36.0–46.0)
Hemoglobin: 12.5 g/dL (ref 12.0–15.0)
Immature Granulocytes: 0 %
Lymphocytes Relative: 27 %
Lymphs Abs: 1.2 10*3/uL (ref 0.7–4.0)
MCH: 31.9 pg (ref 26.0–34.0)
MCHC: 34.1 g/dL (ref 30.0–36.0)
MCV: 93.6 fL (ref 80.0–100.0)
Monocytes Absolute: 0.5 10*3/uL (ref 0.1–1.0)
Monocytes Relative: 11 %
Neutro Abs: 2.4 10*3/uL (ref 1.7–7.7)
Neutrophils Relative %: 57 %
Platelet Count: 128 10*3/uL — ABNORMAL LOW (ref 150–400)
RBC: 3.92 MIL/uL (ref 3.87–5.11)
RDW: 12.7 % (ref 11.5–15.5)
WBC Count: 4.3 10*3/uL (ref 4.0–10.5)
nRBC: 0 % (ref 0.0–0.2)

## 2022-04-09 LAB — CMP (CANCER CENTER ONLY)
ALT: 9 U/L (ref 0–44)
AST: 14 U/L — ABNORMAL LOW (ref 15–41)
Albumin: 4.2 g/dL (ref 3.5–5.0)
Alkaline Phosphatase: 60 U/L (ref 38–126)
Anion gap: 7 (ref 5–15)
BUN: 12 mg/dL (ref 8–23)
CO2: 28 mmol/L (ref 22–32)
Calcium: 9.1 mg/dL (ref 8.9–10.3)
Chloride: 104 mmol/L (ref 98–111)
Creatinine: 0.95 mg/dL (ref 0.44–1.00)
GFR, Estimated: >60 mL/min (ref >60)
Glucose, Bld: 111 mg/dL — ABNORMAL HIGH (ref 70–99)
Potassium: 4.3 mmol/L (ref 3.5–5.1)
Sodium: 139 mmol/L (ref 135–145)
Total Bilirubin: 0.3 mg/dL (ref 0.3–1.2)
Total Protein: 6.5 g/dL (ref 6.5–8.1)

## 2022-04-09 MED ORDER — SODIUM CHLORIDE (PF) 0.9 % IJ SOLN
INTRAMUSCULAR | Status: AC
  Filled 2022-04-09: qty 50

## 2022-04-09 MED ORDER — IOHEXOL 300 MG/ML  SOLN
75.0000 mL | Freq: Once | INTRAMUSCULAR | Status: AC | PRN
  Administered 2022-04-09: 75 mL via INTRAVENOUS

## 2022-04-11 ENCOUNTER — Inpatient Hospital Stay: Payer: Medicare Other | Admitting: Internal Medicine

## 2022-04-11 ENCOUNTER — Ambulatory Visit: Payer: Medicare Other | Admitting: Internal Medicine

## 2022-04-17 ENCOUNTER — Other Ambulatory Visit (HOSPITAL_COMMUNITY): Payer: Self-pay

## 2022-04-17 ENCOUNTER — Other Ambulatory Visit: Payer: Self-pay | Admitting: Internal Medicine

## 2022-04-17 DIAGNOSIS — C3492 Malignant neoplasm of unspecified part of left bronchus or lung: Secondary | ICD-10-CM

## 2022-04-17 MED ORDER — OSIMERTINIB MESYLATE 80 MG PO TABS
ORAL_TABLET | Freq: Every day | ORAL | 2 refills | Status: DC
  Filled 2022-04-17: qty 30, 30d supply, fill #0
  Filled 2022-05-14: qty 30, 30d supply, fill #1
  Filled 2022-06-18: qty 30, 30d supply, fill #2

## 2022-04-18 ENCOUNTER — Other Ambulatory Visit (HOSPITAL_COMMUNITY): Payer: Self-pay

## 2022-04-20 ENCOUNTER — Other Ambulatory Visit (HOSPITAL_COMMUNITY): Payer: Self-pay

## 2022-04-24 ENCOUNTER — Other Ambulatory Visit: Payer: Self-pay

## 2022-04-24 ENCOUNTER — Encounter: Payer: Self-pay | Admitting: Internal Medicine

## 2022-04-24 ENCOUNTER — Inpatient Hospital Stay: Payer: Medicare Other | Attending: Internal Medicine | Admitting: Internal Medicine

## 2022-04-24 ENCOUNTER — Other Ambulatory Visit (HOSPITAL_COMMUNITY): Payer: Self-pay

## 2022-04-24 VITALS — BP 92/61 | HR 86 | Temp 98.0°F | Resp 18 | Wt 160.0 lb

## 2022-04-24 DIAGNOSIS — C3492 Malignant neoplasm of unspecified part of left bronchus or lung: Secondary | ICD-10-CM | POA: Diagnosis not present

## 2022-04-24 DIAGNOSIS — C3412 Malignant neoplasm of upper lobe, left bronchus or lung: Secondary | ICD-10-CM | POA: Insufficient documentation

## 2022-04-24 DIAGNOSIS — J90 Pleural effusion, not elsewhere classified: Secondary | ICD-10-CM | POA: Diagnosis not present

## 2022-04-24 NOTE — Progress Notes (Signed)
[place] [phone]   [phone]  OFFICE PROGRESS NOTE  [name], MD [place] [address]  DIAGNOSIS: Stage IIIc (T3, N3, M0) non-small cell lung cancer, adenocarcinoma presented with large left upper lobe lung mass in addition to left infrahilar and right hilar and subcarinal lymphadenopathy diagnosed in August 2021.   Molecular Biomarkers: Insufficient tissue for foundation 1. Guardant 360 results.  DETECTED ALTERATION(S) / BIOMARKER(S) % CFDNA OR AMPLIFICATION ASSOCIATED FDA-APPROVED THERAPIES CLINICAL TRIAL AVAILABILITY  EGFR L858R, 1.2%, Afatinib, Dacomitinib, Erlotinib, Gefitinib, Osimertinib, Ramucirumab Yes [id number], 1.0%, Afatinib, Dacomitinib, Erlotinib, Gefitinib, Osimertinib, Ramucirumab Yes [id number] 1.0% None  No   PRIOR THERAPY: Weekly concurrent chemoradiation with carboplatin for an AUC of 2 and paclitaxel 45 mg/m2.  First dose expected on 11/16/2019. Status post 7 cycles.   CURRENT THERAPY: Tagrisso 80 mg p.o. daily. First dose started 01/31/2020.  Status post 26 months of treatment.  INTERVAL HISTORY: Linda Woods 72 y.o. female returns to the clinic today for 3 months follow-up visit.  The patient is feeling fine today with no concerning complaints except for arthralgia.  She denied having any significant chest pain, shortness of breath, cough or hemoptysis.  She has no nausea, vomiting, diarrhea or constipation.  She has no headache or visual changes.  She has 3 pound of weight loss recently.  The patient has been tolerating her treatment with Tagrisso fairly well she is here today for evaluation and repeat CT scan of the chest for restaging of her disease.   MEDICAL HISTORY: Past Medical History:  Diagnosis Date   Anemia    as a child   Anxiety    Asthma 10/19/2020   Bursitis    Chronic reflux esophagitis    Complication of anesthesia    Diarrhea, functional    Diverticulosis     GERD (gastroesophageal reflux disease)    History of kidney stones    Hypertension    Knee pain    right knee-seeing ortho   lung ca 09/2019   Osteoarthritis    Plantar fasciitis    PONV (postoperative nausea and vomiting)    has used the patch before and that helps   Pure hypercholesterolemia    RLS (restless legs syndrome)    Superficial vein thrombosis    Thyroid disease    hypothyroid    ALLERGIES:  has No Known Allergies.  MEDICATIONS:  Current Outpatient Medications  Medication Sig Dispense Refill   albuterol (VENTOLIN HFA) 108 (90 Base) MCG/ACT inhaler Inhale 2 puffs into the lungs every 6 (six) hours as needed for wheezing or shortness of breath. 8.5 g 0   benzonatate (TESSALON) 100 MG capsule Take 100 mg by mouth 3 (three) times daily as needed for cough.     cholecalciferol (VITAMIN D3) 25 MCG (1000 UNIT) tablet Take 1,000 Units by mouth daily. Pt states she take 2000 units/day     cyclobenzaprine (FLEXERIL) 10 MG tablet Take 10 mg by mouth 3 (three) times daily as needed for muscle spasms.     famotidine (PEPCID) 40 MG tablet Take 40 mg by mouth See admin instructions. 4 times a week     ferrous sulfate 325 (65 FE) MG tablet Take 325 mg by mouth daily with breakfast.     fexofenadine (ALLEGRA) 180 MG tablet Take 180 mg by mouth daily.     fluticasone (FLONASE) 50 MCG/ACT nasal spray Place 2 sprays into both nostrils as needed for allergies.  HYDROcodone-acetaminophen (NORCO/VICODIN) 5-325 MG tablet Take 0.5 tablets by mouth daily as needed (Knee/Back pain).     HYDROcodone-homatropine (HYCODAN) 5-1.5 MG/5ML syrup Take 5 mLs by mouth daily as needed for cough.     lidocaine (LIDODERM) 5 % 1 patch daily.     loperamide (IMODIUM) 2 MG capsule Take 2-4 mg by mouth every 6 (six) hours as needed.     LORazepam (ATIVAN) 0.5 MG tablet Take 0.5 mg by mouth at bedtime.      magic mouthwash SOLN 5 to 10 ml swish and spit or swallow QID prn mouth and esophageal pain (Patient  not taking: Reported on 01/01/2022) 240 mL 2   Magnesium-Potassium 250-100 MG TABS Take 1 tablet by mouth daily.     melatonin 5 MG TABS Take 5 mg by mouth at bedtime.     metoprolol succinate (TOPROL-XL) 50 MG 24 hr tablet Take 50 mg by mouth daily.      MYRBETRIQ 50 MG TB24 tablet Take 50 mg by mouth daily.     osimertinib mesylate (TAGRISSO) 80 MG tablet TAKE 1 TABLET BY MOUTH DAILY. 30 tablet 2   oxymetazoline (AFRIN) 0.05 % nasal spray Place 1 spray into both nostrils daily as needed for congestion.     phenazopyridine (PYRIDIUM) 200 MG tablet Take 200 mg by mouth daily.     PREMARIN vaginal cream Place vaginally.     RABEprazole (ACIPHEX) 20 MG tablet Take 1 tablet (20 mg total) by mouth 2 (two) times a week.     rosuvastatin (CRESTOR) 5 MG tablet Take 5 mg by mouth daily.     SYNTHROID 112 MCG tablet Take 112 mcg by mouth daily before breakfast.      vitamin B-12 (CYANOCOBALAMIN) 1000 MCG tablet Take 1,000 mcg by mouth daily.     No current facility-administered medications for this visit.    SURGICAL HISTORY:  Past Surgical History:  Procedure Laterality Date   APPENDECTOMY     BRONCHIAL BIOPSY  10/20/2019   Procedure: BRONCHIAL BIOPSIES;  Surgeon: Collene Gobble, MD;  Location: [facility] ENDOSCOPY;  Service: Pulmonary;;   BRONCHIAL BRUSHINGS  10/20/2019   Procedure: BRONCHIAL BRUSHINGS;  Surgeon: Collene Gobble, MD;  Location: [facility] ENDOSCOPY;  Service: Pulmonary;;   BRONCHIAL NEEDLE ASPIRATION BIOPSY  10/20/2019   Procedure: BRONCHIAL NEEDLE ASPIRATION BIOPSIES;  Surgeon: Collene Gobble, MD;  Location: [facility] ENDOSCOPY;  Service: Pulmonary;;   BRONCHIAL WASHINGS  10/20/2019   Procedure: BRONCHIAL WASHINGS;  Surgeon: Collene Gobble, MD;  Location: [facility] ENDOSCOPY;  Service: Pulmonary;;   CARPAL TUNNEL RELEASE Right 2011   Dr. Amedeo Plenty   COLPORRHAPHY     posterior   HAND SURGERY Right 2016   Nerve surgery, Dr. Amedeo Plenty   OVARIAN CYST REMOVAL     RECTOCELE REPAIR  2011   w/TVH and sling    TONSILLECTOMY     TONSILLECTOMY     TOTAL KNEE ARTHROPLASTY Left 09/09/2018   Procedure: TOTAL KNEE ARTHROPLASTY, CORTISONE INJECTION RIGHT KNEE;  Surgeon: Paralee Cancel, MD;  Location: [facility] ORS;  Service: Orthopedics;  Laterality: Left;  70 mins   TOTAL VAGINAL HYSTERECTOMY  10/18/2009   rectocele repair, sling   TUBAL LIGATION Bilateral    VARICOSE VEIN SURGERY     VIDEO BRONCHOSCOPY WITH ENDOBRONCHIAL NAVIGATION N/A 10/20/2019   Procedure: VIDEO BRONCHOSCOPY WITH ENDOBRONCHIAL NAVIGATION;  Surgeon: Collene Gobble, MD;  Location: [place] ENDOSCOPY;  Service: Pulmonary;  Laterality: N/A;    REVIEW OF SYSTEMS:  Constitutional: negative Eyes: negative  Ears, nose, mouth, throat, and face: negative Respiratory: negative Cardiovascular: negative Gastrointestinal: negative Genitourinary:negative Integument/breast: negative Hematologic/lymphatic: negative Musculoskeletal:positive for arthralgias Neurological: negative Behavioral/Psych: negative Endocrine: negative Allergic/Immunologic: negative   PHYSICAL EXAMINATION: General appearance: alert, cooperative, fatigued, and no distress Head: Normocephalic, without obvious abnormality, atraumatic Neck: no adenopathy, no JVD, supple, symmetrical, trachea midline, and thyroid not enlarged, symmetric, no tenderness/mass/nodules Lymph nodes: Cervical, supraclavicular, and axillary nodes normal. Resp: clear to auscultation bilaterally Back: symmetric, no curvature. ROM normal. No CVA tenderness. Cardio: regular rate and rhythm, S1, S2 normal, no murmur, click, rub or gallop GI: soft, non-tender; bowel sounds normal; no masses,  no organomegaly Extremities: extremities normal, atraumatic, no cyanosis or edema Neurologic: Alert and oriented X 3, normal strength and tone. Normal symmetric reflexes. Normal coordination and gait  ECOG PERFORMANCE STATUS: 1 - Symptomatic but completely ambulatory  Blood pressure 92/61, pulse 86, temperature 98 F (36.7  C), temperature source Oral, resp. rate 18, weight 160 lb (72.6 kg), last menstrual period 08/21/2002, SpO2 100 %.  LABORATORY DATA: Lab Results  Component Value Date   WBC 4.3 04/09/2022   HGB 12.5 04/09/2022   HCT 36.7 04/09/2022   MCV 93.6 04/09/2022   PLT 128 (L) 04/09/2022      Chemistry      Component Value Date/Time   NA 139 04/09/2022 1452   K 4.3 04/09/2022 1452   CL 104 04/09/2022 1452   CO2 28 04/09/2022 1452   BUN 12 04/09/2022 1452   CREATININE 0.95 04/09/2022 1452      Component Value Date/Time   CALCIUM 9.1 04/09/2022 1452   ALKPHOS 60 04/09/2022 1452   AST 14 (L) 04/09/2022 1452   ALT 9 04/09/2022 1452   BILITOT 0.3 04/09/2022 1452       RADIOGRAPHIC STUDIES: CT Chest W Contrast  Result Date: 04/10/2022 CLINICAL DATA:  Non-small-cell lung cancer staging. * Tracking Code: BO * EXAM: CT CHEST WITH CONTRAST TECHNIQUE: Multidetector CT imaging of the chest was performed during intravenous contrast administration. RADIATION DOSE REDUCTION: This exam was performed according to the departmental dose-optimization program which includes automated exposure control, adjustment of the mA and/or kV according to patient size and/or use of iterative reconstruction technique. CONTRAST:  48m OMNIPAQUE IOHEXOL 300 MG/ML  SOLN COMPARISON:  Chest CT 10/05/2021 and older FINDINGS: Cardiovascular: Heart is nonenlarged. Coronary artery calcifications are seen. No significant pericardial effusion. The thoracic aorta has a normal normal course and caliber with mild atherosclerotic change. Mediastinum/Nodes: There is no specific abnormal lymph node enlargement seen in the axillary region, hilum or mediastinum. There is fluid in the pericardial recess. There is a small hiatal hernia with some fluid tracking along the right side of the hernia sac in the adjacent fat as well which is unchanged from prior. Normal caliber thoracic esophagus otherwise. Lungs/Pleura: Once again in the left lung  has areas of collapse with bronchiectasis and scarring along the posterior left upper lobe extending back to the hilum. There are some additional areas of opacity nodularity bronchiectasis in the medial left lower lobe superior segment abutting the hilum. The distribution of this areas similar to previous. There is some fluid adjacent to the left lower lobe foci which are stable. There is also perihilar bandlike changes in the right lung with lung distortion, bronchiectasis and volume loss which is stable as well. No new lung mass identified. Upper Abdomen: Along the upper abdomen the left adrenal gland is incompletely included in the imaging field but preserved. There is a nodule  in the right adrenal gland which is stable measuring 13 by 12 mm. This has been present since at least 2021 and was not hypermetabolic on a PET-CT scan. Presumed Bosniak 1 right-sided renal cyst incompletely included in the imaging field. Musculoskeletal: Scattered degenerative changes of the spine. Sclerotic lesion at T11 vertebral body is stable. IMPRESSION: 1. Stable appearance of the lungs with areas of scarring, bronchiectasis and volume loss in the left upper lobe and left lower lobe superior segment. No new lung mass identified. 2. Stable right adrenal nodule. 3. Small hiatal hernia with some fluid tracking along the right side of the hernia sac in the adjacent fat which is unchanged from prior. 4. Stable sclerotic lesion at T11 vertebral body. Aortic Atherosclerosis (ICD10-I70.0). Electronically Signed   By: Jill Side M.D.   On: 04/10/2022 14:11    ASSESSMENT AND PLAN: This is a very pleasant 73 years old white female recently diagnosed with a stage IIIc/IV (T3, N3, M0/M1a) non-small cell lung cancer, adenocarcinoma presented with large left upper lobe lung mass in addition to left infrahilar, right hilar and subcarinal lymphadenopathy and small left pleural effusion diagnosed in August 2021 with positive EGFR mutation in exon  21 (L858R). The patient completed a course of concurrent chemoradiation with weekly carboplatin and paclitaxel status post 7 cycles.  She tolerated her treatment well except for fatigue as well as a skin burn and cough. Her scan showed mild improvement of the left upper lobe lung mass as well as the mediastinal lymphadenopathy.  The patient continues to have small left pleural effusion that is suspicious given her presentation. She is currently on treatment with Tagrisso 80 mg p.o. daily started on January 31, 2020.  She is status post 26 months of treatment.   The patient has been tolerating this treatment well with no concerning adverse effect except for mild arthralgia. She had repeat CT scan of the chest performed recently.  I personally and independently reviewed the scan and discussed the result with the patient today. Her scan showed no concerning findings for disease progression. I recommended for her to continue her current treatment with Tagrisso with the same dose. I will see her back for follow-up visit in 3 months for evaluation and repeat blood work. She was advised to call immediately if she has any other concerning symptoms in the interval. The patient voices understanding of current disease status and treatment options and is in agreement with the current care plan.  All questions were answered. The patient knows to call the clinic with any problems, questions or concerns. We can certainly see the patient much sooner if necessary.  Disclaimer: This note was dictated with voice recognition software. Similar sounding words can inadvertently be transcribed and may not be corrected upon review.

## 2022-04-25 ENCOUNTER — Other Ambulatory Visit: Payer: Self-pay

## 2022-05-01 ENCOUNTER — Encounter: Payer: Self-pay | Admitting: Podiatry

## 2022-05-01 ENCOUNTER — Ambulatory Visit: Payer: Medicare Other | Admitting: Podiatry

## 2022-05-01 DIAGNOSIS — L600 Ingrowing nail: Secondary | ICD-10-CM | POA: Diagnosis not present

## 2022-05-01 MED ORDER — NEOMYCIN-POLYMYXIN-HC 1 % OT SOLN: OTIC | 1 refills | Status: DC

## 2022-05-01 NOTE — Progress Notes (Signed)
Subjective:  Patient ID: Linda Woods, female    DOB: 03-Mar-1950,  MRN: [id number] HPI Chief Complaint  Patient presents with   Toe Pain    4th toe right - lateral border, ingrown x few months, tried filing-no help   New Patient (Initial Visit)    Est pt 2019    72 y.o. female presents with the above complaint.   ROS: Denies fever chills nausea vomit muscle aches pains calf pain back pain chest pain shortness of breath.  Past Medical History:  Diagnosis Date   Anemia    as a child   Anxiety    Asthma 10/19/2020   Bursitis    Chronic reflux esophagitis    Complication of anesthesia    Diarrhea, functional    Diverticulosis    GERD (gastroesophageal reflux disease)    History of kidney stones    Hypertension    Knee pain    right knee-seeing ortho   lung ca 09/2019   Osteoarthritis    Plantar fasciitis    PONV (postoperative nausea and vomiting)    has used the patch before and that helps   Pure hypercholesterolemia    RLS (restless legs syndrome)    Superficial vein thrombosis    Thyroid disease    hypothyroid   Past Surgical History:  Procedure Laterality Date   APPENDECTOMY     BRONCHIAL BIOPSY  10/20/2019   Procedure: BRONCHIAL BIOPSIES;  Surgeon: Collene Gobble, MD;  Location: [facility] ENDOSCOPY;  Service: Pulmonary;;   BRONCHIAL BRUSHINGS  10/20/2019   Procedure: BRONCHIAL BRUSHINGS;  Surgeon: Collene Gobble, MD;  Location: [place];  Service: Pulmonary;;   BRONCHIAL NEEDLE ASPIRATION BIOPSY  10/20/2019   Procedure: BRONCHIAL NEEDLE ASPIRATION BIOPSIES;  Surgeon: Collene Gobble, MD;  Location: [place] ENDOSCOPY;  Service: Pulmonary;;   BRONCHIAL WASHINGS  10/20/2019   Procedure: BRONCHIAL WASHINGS;  Surgeon: Collene Gobble, MD;  Location: [place] ENDOSCOPY;  Service: Pulmonary;;   CARPAL TUNNEL RELEASE Right 2011   Dr. Amedeo Plenty   COLPORRHAPHY     posterior   HAND SURGERY Right 2016   Nerve surgery, Dr. Amedeo Plenty   OVARIAN CYST REMOVAL     RECTOCELE REPAIR  2011    w/TVH and sling   TONSILLECTOMY     TONSILLECTOMY     TOTAL KNEE ARTHROPLASTY Left 09/09/2018   Procedure: TOTAL KNEE ARTHROPLASTY, CORTISONE INJECTION RIGHT KNEE;  Surgeon: Paralee Cancel, MD;  Location: [facility] ORS;  Service: Orthopedics;  Laterality: Left;  70 mins   TOTAL VAGINAL HYSTERECTOMY  10/18/2009   rectocele repair, sling   TUBAL LIGATION Bilateral    VARICOSE VEIN SURGERY     VIDEO BRONCHOSCOPY WITH ENDOBRONCHIAL NAVIGATION N/A 10/20/2019   Procedure: VIDEO BRONCHOSCOPY WITH ENDOBRONCHIAL NAVIGATION;  Surgeon: Collene Gobble, MD;  Location: [place] ENDOSCOPY;  Service: Pulmonary;  Laterality: N/A;    Current Outpatient Medications:    NEOMYCIN-POLYMYXIN-HYDROCORTISONE (CORTISPORIN) 1 % SOLN OTIC solution, Apply 1-2 drops to toe BID after soaking, Disp: 10 mL, Rfl: 1   albuterol (VENTOLIN HFA) 108 (90 Base) MCG/ACT inhaler, Inhale 2 puffs into the lungs every 6 (six) hours as needed for wheezing or shortness of breath., Disp: 8.5 g, Rfl: 0   cholecalciferol (VITAMIN D3) 25 MCG (1000 UNIT) tablet, Take 1,000 Units by mouth daily. Pt states she take 2000 units/day, Disp: , Rfl:    cyclobenzaprine (FLEXERIL) 10 MG tablet, Take 10 mg by mouth 3 (three) times daily as needed for muscle spasms., Disp: , Rfl:  famotidine (PEPCID) 40 MG tablet, Take 40 mg by mouth See admin instructions. 4 times a week, Disp: , Rfl:    ferrous sulfate 325 (65 FE) MG tablet, Take 325 mg by mouth daily with breakfast., Disp: , Rfl:    fexofenadine (ALLEGRA) 180 MG tablet, Take 180 mg by mouth daily., Disp: , Rfl:    fluticasone (FLONASE) 50 MCG/ACT nasal spray, Place 2 sprays into both nostrils as needed for allergies., Disp: , Rfl:    HYDROcodone-homatropine (HYCODAN) 5-1.5 MG/5ML syrup, Take 5 mLs by mouth daily as needed for cough., Disp: , Rfl:    lidocaine (LIDODERM) 5 %, 1 patch daily., Disp: , Rfl:    loperamide (IMODIUM) 2 MG capsule, Take 2-4 mg by mouth every 6 (six) hours as needed., Disp: , Rfl:     LORazepam (ATIVAN) 0.5 MG tablet, Take 0.5 mg by mouth at bedtime. , Disp: , Rfl:    Magnesium-Potassium 250-100 MG TABS, Take 1 tablet by mouth daily., Disp: , Rfl:    melatonin 5 MG TABS, Take 5 mg by mouth at bedtime., Disp: , Rfl:    metoprolol succinate (TOPROL-XL) 50 MG 24 hr tablet, Take 50 mg by mouth daily. , Disp: , Rfl:    MYRBETRIQ 50 MG TB24 tablet, Take 50 mg by mouth daily., Disp: , Rfl:    osimertinib mesylate (TAGRISSO) 80 MG tablet, TAKE 1 TABLET BY MOUTH DAILY., Disp: 30 tablet, Rfl: 2   oxymetazoline (AFRIN) 0.05 % nasal spray, Place 1 spray into both nostrils daily as needed for congestion., Disp: , Rfl:    PREMARIN vaginal cream, Place vaginally., Disp: , Rfl:    RABEprazole (ACIPHEX) 20 MG tablet, Take 1 tablet (20 mg total) by mouth 2 (two) times a week., Disp: , Rfl:    rosuvastatin (CRESTOR) 5 MG tablet, Take 5 mg by mouth daily., Disp: , Rfl:    SYNTHROID 112 MCG tablet, Take 112 mcg by mouth daily before breakfast. , Disp: , Rfl:    vitamin B-12 (CYANOCOBALAMIN) 1000 MCG tablet, Take 1,000 mcg by mouth daily., Disp: , Rfl:   No Known Allergies Review of Systems Objective:  There were no vitals filed for this visit.  General: Well developed, nourished, in no acute distress, alert and oriented x3   Dermatological: Skin is warm, dry and supple bilateral. Nails x 10 are well maintained; remaining integument appears unremarkable at this time. There are no open sores, no preulcerative lesions, no rash or signs of infection present.  Fibular border fourth digit right foot demonstrates mild erythema and a dried blistered area.  Moderately tender on palpation.  Vascular: Dorsalis Pedis artery and Posterior Tibial artery pedal pulses are 2/4 bilateral with immedate capillary fill time. Pedal hair growth present. No varicosities and no lower extremity edema present bilateral.   Neruologic: Grossly intact via light touch bilateral. Vibratory intact via tuning fork bilateral.  Protective threshold with Semmes Wienstein monofilament intact to all pedal sites bilateral. Patellar and Achilles deep tendon reflexes 2+ bilateral. No Babinski or clonus noted bilateral.   Musculoskeletal: No gross boney pedal deformities bilateral. No pain, crepitus, or limitation noted with foot and ankle range of motion bilateral. Muscular strength 5/5 in all groups tested bilateral.  Gait: Unassisted, Nonantalgic.    Radiographs:  None taken  Assessment & Plan:   Assessment: Ingrown toenail fibular border fourth digit right foot most likely secondary to her chemotherapy.  She has stage IV lung cancer.  Plan: Discussed etiology pathology and surgical therapies chemical  matricectomy to the fibular border fourth digit right foot.  Tolerated procedure well after local anesthetic was administered given both oral and written number instructions for the care and soaking of the toe as well as a prescription for Cortisporin Otic to be applied twice daily after soaking.  Like to follow-up with her in about 2 weeks     Kordel Leavy T. [place], [place]

## 2022-05-01 NOTE — Patient Instructions (Signed)

## 2022-05-07 DIAGNOSIS — R7301 Impaired fasting glucose: Secondary | ICD-10-CM | POA: Diagnosis not present

## 2022-05-07 DIAGNOSIS — E039 Hypothyroidism, unspecified: Secondary | ICD-10-CM | POA: Diagnosis not present

## 2022-05-07 DIAGNOSIS — E782 Mixed hyperlipidemia: Secondary | ICD-10-CM | POA: Diagnosis not present

## 2022-05-07 DIAGNOSIS — M150 Primary generalized (osteo)arthritis: Secondary | ICD-10-CM | POA: Diagnosis not present

## 2022-05-10 ENCOUNTER — Other Ambulatory Visit (HOSPITAL_COMMUNITY): Payer: Self-pay

## 2022-05-14 ENCOUNTER — Other Ambulatory Visit (HOSPITAL_COMMUNITY): Payer: Self-pay

## 2022-05-14 ENCOUNTER — Other Ambulatory Visit: Payer: Self-pay

## 2022-05-22 ENCOUNTER — Other Ambulatory Visit: Payer: Self-pay

## 2022-05-28 ENCOUNTER — Ambulatory Visit: Payer: Medicare Other | Admitting: Podiatry

## 2022-05-29 ENCOUNTER — Encounter: Payer: Self-pay | Admitting: Podiatry

## 2022-05-29 ENCOUNTER — Ambulatory Visit: Payer: Medicare Other | Admitting: Podiatry

## 2022-05-29 DIAGNOSIS — Z9889 Other specified postprocedural states: Secondary | ICD-10-CM

## 2022-05-29 DIAGNOSIS — L600 Ingrowing nail: Secondary | ICD-10-CM

## 2022-05-29 DIAGNOSIS — D2371 Other benign neoplasm of skin of right lower limb, including hip: Secondary | ICD-10-CM

## 2022-05-29 NOTE — Progress Notes (Signed)
She denies today chief complaint of pain to the callus on the right hallux along the medial aspect.  She is also here for follow-up of her matrixectomy fibular border fourth digit right foot.  She states the toe is doing very well no problems.   Objective: Vital signs are stable alert and oriented x 3.  Pulses are palpable.  Her fourth digit right foot fibular border does demonstrate some dried exudate which I trimmed away today demonstrating a small abscess which I cleaned out today leaving a small area of bleeding.  I applied silver nitrate to this with a Band-Aid and antibiotic.  I also debrided the reactive hyperkeratotic tissue medial aspect of the hallux.  Assessment: Well-healing surgical fourth toe right.  Painful benign skin lesion callus.  Plan: Debrided benign skin lesion we will follow-up with her with any recurrence.

## 2022-06-04 DIAGNOSIS — C3492 Malignant neoplasm of unspecified part of left bronchus or lung: Secondary | ICD-10-CM | POA: Diagnosis not present

## 2022-06-04 DIAGNOSIS — R21 Rash and other nonspecific skin eruption: Secondary | ICD-10-CM | POA: Diagnosis not present

## 2022-06-04 DIAGNOSIS — D84821 Immunodeficiency due to drugs: Secondary | ICD-10-CM | POA: Diagnosis not present

## 2022-06-05 DIAGNOSIS — I10 Essential (primary) hypertension: Secondary | ICD-10-CM | POA: Diagnosis not present

## 2022-06-05 DIAGNOSIS — E559 Vitamin D deficiency, unspecified: Secondary | ICD-10-CM | POA: Diagnosis not present

## 2022-06-05 DIAGNOSIS — E039 Hypothyroidism, unspecified: Secondary | ICD-10-CM | POA: Diagnosis not present

## 2022-06-05 DIAGNOSIS — E782 Mixed hyperlipidemia: Secondary | ICD-10-CM | POA: Diagnosis not present

## 2022-06-07 DIAGNOSIS — M1711 Unilateral primary osteoarthritis, right knee: Secondary | ICD-10-CM | POA: Diagnosis not present

## 2022-06-18 ENCOUNTER — Other Ambulatory Visit (HOSPITAL_COMMUNITY): Payer: Self-pay

## 2022-06-19 ENCOUNTER — Other Ambulatory Visit (HOSPITAL_COMMUNITY): Payer: Self-pay

## 2022-07-04 ENCOUNTER — Other Ambulatory Visit: Payer: Self-pay

## 2022-07-05 ENCOUNTER — Ambulatory Visit (HOSPITAL_BASED_OUTPATIENT_CLINIC_OR_DEPARTMENT_OTHER): Payer: Medicare Other | Admitting: Obstetrics & Gynecology

## 2022-07-13 ENCOUNTER — Other Ambulatory Visit: Payer: Self-pay | Admitting: Internal Medicine

## 2022-07-13 ENCOUNTER — Other Ambulatory Visit: Payer: Self-pay

## 2022-07-13 DIAGNOSIS — C3492 Malignant neoplasm of unspecified part of left bronchus or lung: Secondary | ICD-10-CM

## 2022-07-14 ENCOUNTER — Encounter: Payer: Self-pay | Admitting: Internal Medicine

## 2022-07-14 MED ORDER — OSIMERTINIB MESYLATE 80 MG PO TABS
ORAL_TABLET | Freq: Every day | ORAL | 2 refills | Status: DC
  Filled 2022-07-14: qty 30, 30d supply, fill #0
  Filled 2022-08-15: qty 30, 30d supply, fill #1
  Filled 2022-09-11: qty 30, 30d supply, fill #2

## 2022-07-17 ENCOUNTER — Other Ambulatory Visit (HOSPITAL_COMMUNITY): Payer: Self-pay

## 2022-07-17 ENCOUNTER — Other Ambulatory Visit: Payer: Self-pay

## 2022-07-17 DIAGNOSIS — J302 Other seasonal allergic rhinitis: Secondary | ICD-10-CM | POA: Diagnosis not present

## 2022-07-17 DIAGNOSIS — R21 Rash and other nonspecific skin eruption: Secondary | ICD-10-CM | POA: Diagnosis not present

## 2022-07-20 ENCOUNTER — Encounter: Payer: Self-pay | Admitting: Physician Assistant

## 2022-07-20 ENCOUNTER — Telehealth: Payer: Self-pay | Admitting: Medical Oncology

## 2022-07-20 ENCOUNTER — Other Ambulatory Visit: Payer: Self-pay | Admitting: Physician Assistant

## 2022-07-20 ENCOUNTER — Other Ambulatory Visit: Payer: Self-pay | Admitting: Medical Oncology

## 2022-07-20 DIAGNOSIS — L299 Pruritus, unspecified: Secondary | ICD-10-CM

## 2022-07-20 MED ORDER — TRIAMCINOLONE ACETONIDE 0.1 % EX CREA: 1.0000 | TOPICAL_CREAM | Freq: Two times a day (BID) | CUTANEOUS | 0 refills | Status: AC

## 2022-07-20 NOTE — Telephone Encounter (Signed)
Spots on skin- She is on tagrisso -for 2-3 years .Pt described these  spots as " pimple like and turn red and itch.They move around to face, legs, stomach nose, scalp and legs ".  Cleda Mccreedy , NP at [facility] consulted with provider at  [facility] and recommended Prednisone 20 mg tablets -take 2 daily x 7 days then 20 mg daily x 7 days. For the itching -triamcinolone   1 % cream bid x 2 weeks , then off x 1 week and repeat.  Message sent to Cassie.

## 2022-07-24 ENCOUNTER — Other Ambulatory Visit: Payer: Self-pay

## 2022-07-24 ENCOUNTER — Inpatient Hospital Stay: Payer: Medicare Other | Attending: Internal Medicine

## 2022-07-24 ENCOUNTER — Inpatient Hospital Stay: Payer: Medicare Other | Admitting: Internal Medicine

## 2022-07-24 VITALS — BP 123/76 | HR 102 | Temp 97.9°F | Resp 16 | Ht 65.0 in | Wt 164.7 lb

## 2022-07-24 DIAGNOSIS — R21 Rash and other nonspecific skin eruption: Secondary | ICD-10-CM | POA: Insufficient documentation

## 2022-07-24 DIAGNOSIS — C3490 Malignant neoplasm of unspecified part of unspecified bronchus or lung: Secondary | ICD-10-CM | POA: Diagnosis not present

## 2022-07-24 DIAGNOSIS — C3492 Malignant neoplasm of unspecified part of left bronchus or lung: Secondary | ICD-10-CM

## 2022-07-24 DIAGNOSIS — C3412 Malignant neoplasm of upper lobe, left bronchus or lung: Secondary | ICD-10-CM | POA: Insufficient documentation

## 2022-07-24 LAB — CBC WITH DIFFERENTIAL (CANCER CENTER ONLY)
Abs Immature Granulocytes: 0.01 10*3/uL (ref 0.00–0.07)
Basophils Absolute: 0 10*3/uL (ref 0.0–0.1)
Basophils Relative: 0 %
Eosinophils Absolute: 0.2 10*3/uL (ref 0.0–0.5)
Eosinophils Relative: 4 %
HCT: 37.2 % (ref 36.0–46.0)
Hemoglobin: 12.7 g/dL (ref 12.0–15.0)
Immature Granulocytes: 0 %
Lymphocytes Relative: 28 %
Lymphs Abs: 1.5 10*3/uL (ref 0.7–4.0)
MCH: 32.3 pg (ref 26.0–34.0)
MCHC: 34.1 g/dL (ref 30.0–36.0)
MCV: 94.7 fL (ref 80.0–100.0)
Monocytes Absolute: 0.6 10*3/uL (ref 0.1–1.0)
Monocytes Relative: 11 %
Neutro Abs: 3.1 10*3/uL (ref 1.7–7.7)
Neutrophils Relative %: 57 %
Platelet Count: 165 10*3/uL (ref 150–400)
RBC: 3.93 MIL/uL (ref 3.87–5.11)
RDW: 12.9 % (ref 11.5–15.5)
WBC Count: 5.5 10*3/uL (ref 4.0–10.5)
nRBC: 0 % (ref 0.0–0.2)

## 2022-07-24 LAB — CMP (CANCER CENTER ONLY)
ALT: 19 U/L (ref 0–44)
AST: 22 U/L (ref 15–41)
Albumin: 4.6 g/dL (ref 3.5–5.0)
Alkaline Phosphatase: 62 U/L (ref 38–126)
Anion gap: 8 (ref 5–15)
BUN: 16 mg/dL (ref 8–23)
CO2: 26 mmol/L (ref 22–32)
Calcium: 9.4 mg/dL (ref 8.9–10.3)
Chloride: 105 mmol/L (ref 98–111)
Creatinine: 1.14 mg/dL — ABNORMAL HIGH (ref 0.44–1.00)
GFR, Estimated: 51 mL/min — ABNORMAL LOW (ref >60)
Glucose, Bld: 82 mg/dL (ref 70–99)
Potassium: 3.9 mmol/L (ref 3.5–5.1)
Sodium: 139 mmol/L (ref 135–145)
Total Bilirubin: 0.4 mg/dL (ref 0.3–1.2)
Total Protein: 7.1 g/dL (ref 6.5–8.1)

## 2022-07-24 NOTE — Progress Notes (Signed)
[facility] Telephone:(336) [phone]   Fax:(336) [phone]  OFFICE PROGRESS NOTE  Gareth Morgan, MD [address] [place] [address]  DIAGNOSIS: Stage IIIc (T3, N3, M0) non-small cell lung cancer, adenocarcinoma presented with large left upper lobe lung mass in addition to left infrahilar and right hilar and subcarinal lymphadenopathy diagnosed in August 2021.   Molecular Biomarkers: Insufficient tissue for foundation 1. Guardant 360 results.  DETECTED ALTERATION(S) / BIOMARKER(S) % CFDNA OR AMPLIFICATION ASSOCIATED FDA-APPROVED THERAPIES CLINICAL TRIAL AVAILABILITY  EGFR L858R, 1.2%, Afatinib, Dacomitinib, Erlotinib, Gefitinib, Osimertinib, Ramucirumab Yes [id number], 1.0%, Afatinib, Dacomitinib, Erlotinib, Gefitinib, Osimertinib, Ramucirumab Yes [id number] 1.0% None  No   PRIOR THERAPY: Weekly concurrent chemoradiation with carboplatin for an AUC of 2 and paclitaxel 45 mg/m2.  First dose expected on 11/16/2019. Status post 7 cycles.   CURRENT THERAPY: Tagrisso 80 mg p.o. daily. First dose started 01/31/2020.  Status post 29 months of treatment.  INTERVAL HISTORY: Linda Woods 72 y.o. female returns to the clinic today for follow-up visit.  The patient is feeling fine today with no concerning complaints except for the occasional diarrhea improved with Imodium.  She also had an episode of skin rash especially in the legs and this is improved.  She denied having any chest pain, shortness of breath, cough or hemoptysis.  She has no nausea, vomiting, abdominal pain.  She has no headache or visual changes.  She denied having any recent weight loss or night sweats.  She have some episodes of grahams over her body. She continues to tolerate her treatment with Tagrisso fairly well.  She is here today for evaluation and repeat blood work.  MEDICAL HISTORY: Past Medical History:  Diagnosis Date   Anemia    as a child   Anxiety    Asthma 10/19/2020   Bursitis     Chronic reflux esophagitis    Complication of anesthesia    Diarrhea, functional    Diverticulosis    GERD (gastroesophageal reflux disease)    History of kidney stones    Hypertension    Knee pain    right knee-seeing ortho   lung ca 09/2019   Osteoarthritis    Plantar fasciitis    PONV (postoperative nausea and vomiting)    has used the patch before and that helps   Pure hypercholesterolemia    RLS (restless legs syndrome)    Superficial vein thrombosis    Thyroid disease    hypothyroid    ALLERGIES:  has No Known Allergies.  MEDICATIONS:  Current Outpatient Medications  Medication Sig Dispense Refill   albuterol (VENTOLIN HFA) 108 (90 Base) MCG/ACT inhaler Inhale 2 puffs into the lungs every 6 (six) hours as needed for wheezing or shortness of breath. 8.5 g 0   cholecalciferol (VITAMIN D3) 25 MCG (1000 UNIT) tablet Take 1,000 Units by mouth daily. Pt states she take 2000 units/day     cyclobenzaprine (FLEXERIL) 10 MG tablet Take 10 mg by mouth 3 (three) times daily as needed for muscle spasms.     famotidine (PEPCID) 40 MG tablet Take 40 mg by mouth See admin instructions. 4 times a week     ferrous sulfate 325 (65 FE) MG tablet Take 325 mg by mouth daily with breakfast.     fexofenadine (ALLEGRA) 180 MG tablet Take 180 mg by mouth daily.     fluticasone (FLONASE) 50 MCG/ACT nasal spray Place 2 sprays into both nostrils as needed for allergies.  HYDROcodone-acetaminophen (NORCO/VICODIN) 5-325 MG tablet SMARTSIG:1 Tablet(s) By Mouth 1-4 Times Daily     HYDROcodone-homatropine (HYCODAN) 5-1.5 MG/5ML syrup Take 5 mLs by mouth daily as needed for cough.     lidocaine (LIDODERM) 5 % 1 patch daily.     loperamide (IMODIUM) 2 MG capsule Take 2-4 mg by mouth every 6 (six) hours as needed.     LORazepam (ATIVAN) 0.5 MG tablet Take 0.5 mg by mouth at bedtime.      Magnesium-Potassium 250-100 MG TABS Take 1 tablet by mouth daily.     melatonin 5 MG TABS Take 5 mg by mouth at  bedtime.     metoprolol succinate (TOPROL-XL) 50 MG 24 hr tablet Take 50 mg by mouth daily.      MYRBETRIQ 50 MG TB24 tablet Take 50 mg by mouth daily.     NEOMYCIN-POLYMYXIN-HYDROCORTISONE (CORTISPORIN) 1 % SOLN OTIC solution Apply 1-2 drops to toe BID after soaking 10 mL 1   osimertinib mesylate (TAGRISSO) 80 MG tablet TAKE 1 TABLET BY MOUTH DAILY. 30 tablet 2   oxymetazoline (AFRIN) 0.05 % nasal spray Place 1 spray into both nostrils daily as needed for congestion.     PREMARIN vaginal cream Place vaginally.     RABEprazole (ACIPHEX) 20 MG tablet Take 1 tablet (20 mg total) by mouth 2 (two) times a week.     rosuvastatin (CRESTOR) 5 MG tablet Take 5 mg by mouth daily.     SYNTHROID 112 MCG tablet Take 112 mcg by mouth daily before breakfast.      triamcinolone cream (KENALOG) 0.1 % Apply 1 Application topically 2 (two) times daily. As needed for itching/rash 453.6 g 0   vitamin B-12 (CYANOCOBALAMIN) 1000 MCG tablet Take 1,000 mcg by mouth daily.     No current facility-administered medications for this visit.    SURGICAL HISTORY:  Past Surgical History:  Procedure Laterality Date   APPENDECTOMY     BRONCHIAL BIOPSY  10/20/2019   Procedure: BRONCHIAL BIOPSIES;  Surgeon: Leslye Peer, MD;  Location: [facility] ENDOSCOPY;  Service: Pulmonary;;   BRONCHIAL BRUSHINGS  10/20/2019   Procedure: BRONCHIAL BRUSHINGS;  Surgeon: Leslye Peer, MD;  Location: [facility] ENDOSCOPY;  Service: Pulmonary;;   BRONCHIAL NEEDLE ASPIRATION BIOPSY  10/20/2019   Procedure: BRONCHIAL NEEDLE ASPIRATION BIOPSIES;  Surgeon: Leslye Peer, MD;  Location: [facility] ENDOSCOPY;  Service: Pulmonary;;   BRONCHIAL WASHINGS  10/20/2019   Procedure: BRONCHIAL WASHINGS;  Surgeon: Leslye Peer, MD;  Location: [facility] ENDOSCOPY;  Service: Pulmonary;;   CARPAL TUNNEL RELEASE Right 2011   Dr. Amanda Pea   COLPORRHAPHY     posterior   HAND SURGERY Right 2016   Nerve surgery, Dr. Amanda Pea   OVARIAN CYST REMOVAL     RECTOCELE REPAIR  2011   w/TVH  and sling   TONSILLECTOMY     TONSILLECTOMY     TOTAL KNEE ARTHROPLASTY Left 09/09/2018   Procedure: TOTAL KNEE ARTHROPLASTY, CORTISONE INJECTION RIGHT KNEE;  Surgeon: Durene Romans, MD;  Location: [facility] ORS;  Service: Orthopedics;  Laterality: Left;  70 mins   TOTAL VAGINAL HYSTERECTOMY  10/18/2009   rectocele repair, sling   TUBAL LIGATION Bilateral    VARICOSE VEIN SURGERY     VIDEO BRONCHOSCOPY WITH ENDOBRONCHIAL NAVIGATION N/A 10/20/2019   Procedure: VIDEO BRONCHOSCOPY WITH ENDOBRONCHIAL NAVIGATION;  Surgeon: Leslye Peer, MD;  Location: [facility] ENDOSCOPY;  Service: Pulmonary;  Laterality: N/A;    REVIEW OF SYSTEMS:  A comprehensive review of systems was negative except for:  Gastrointestinal: positive for diarrhea Integument/breast: positive for rash   PHYSICAL EXAMINATION: General appearance: alert, cooperative, fatigued, and no distress Head: Normocephalic, without obvious abnormality, atraumatic Neck: no adenopathy, no JVD, supple, symmetrical, trachea midline, and thyroid not enlarged, symmetric, no tenderness/mass/nodules Lymph nodes: Cervical, supraclavicular, and axillary nodes normal. Resp: clear to auscultation bilaterally Back: symmetric, no curvature. ROM normal. No CVA tenderness. Cardio: regular rate and rhythm, S1, S2 normal, no murmur, click, rub or gallop GI: soft, non-tender; bowel sounds normal; no masses,  no organomegaly Extremities: extremities normal, atraumatic, no cyanosis or edema  ECOG PERFORMANCE STATUS: 1 - Symptomatic but completely ambulatory  Blood pressure 123/76, pulse (!) 102, temperature 97.9 F (36.6 C), temperature source Oral, resp. rate 16, height 5\' 5"  (1.651 m), weight 164 lb 11.2 oz (74.7 kg), last menstrual period 08/21/2002, SpO2 97 %.  LABORATORY DATA: Lab Results  Component Value Date   WBC 5.5 07/24/2022   HGB 12.7 07/24/2022   HCT 37.2 07/24/2022   MCV 94.7 07/24/2022   PLT 165 07/24/2022      Chemistry      Component Value  Date/Time   NA 139 07/24/2022 1446   K 3.9 07/24/2022 1446   CL 105 07/24/2022 1446   CO2 26 07/24/2022 1446   BUN 16 07/24/2022 1446   CREATININE 1.14 (H) 07/24/2022 1446      Component Value Date/Time   CALCIUM 9.4 07/24/2022 1446   ALKPHOS 62 07/24/2022 1446   AST 22 07/24/2022 1446   ALT 19 07/24/2022 1446   BILITOT 0.4 07/24/2022 1446       RADIOGRAPHIC STUDIES: No results found.  ASSESSMENT AND PLAN: This is a very pleasant 72 years old white female recently diagnosed with a stage IIIc/IV (T3, N3, M0/M1a) non-small cell lung cancer, adenocarcinoma presented with large left upper lobe lung mass in addition to left infrahilar, right hilar and subcarinal lymphadenopathy and small left pleural effusion diagnosed in August 2021 with positive EGFR mutation in exon 21 (L858R). The patient completed a course of concurrent chemoradiation with weekly carboplatin and paclitaxel status post 7 cycles.  She tolerated her treatment well except for fatigue as well as a skin burn and cough. Her scan showed mild improvement of the left upper lobe lung mass as well as the mediastinal lymphadenopathy.  The patient continues to have small left pleural effusion that is suspicious given her presentation. She is currently on treatment with Tagrisso 80 mg p.o. daily started on January 31, 2020.  She is status post 29 months of treatment.   The patient has been tolerating this treatment well with no concerning adverse effect except for the few episodes of diarrhea and intermittent rash.  She also has muscle cramps intermittently. Her lab work today is unremarkable. I recommended for her to continue her current treatment with Tagrisso with the same dose. I will see her back for follow-up visit in 3 months for evaluation and repeat CT scan of the chest for restaging of her disease. The patient was advised to call immediately if she has any concerning symptoms in the interval. The patient voices  understanding of current disease status and treatment options and is in agreement with the current care plan.  All questions were answered. The patient knows to call the clinic with any problems, questions or concerns. We can certainly see the patient much sooner if necessary.  Disclaimer: This note was dictated with voice recognition software. Similar sounding words can inadvertently be transcribed and may not be corrected  upon review.

## 2022-07-31 DIAGNOSIS — Z96652 Presence of left artificial knee joint: Secondary | ICD-10-CM | POA: Diagnosis not present

## 2022-07-31 DIAGNOSIS — E039 Hypothyroidism, unspecified: Secondary | ICD-10-CM | POA: Diagnosis not present

## 2022-07-31 DIAGNOSIS — M170 Bilateral primary osteoarthritis of knee: Secondary | ICD-10-CM | POA: Diagnosis not present

## 2022-07-31 DIAGNOSIS — J701 Chronic and other pulmonary manifestations due to radiation: Secondary | ICD-10-CM | POA: Diagnosis not present

## 2022-08-07 DIAGNOSIS — H5203 Hypermetropia, bilateral: Secondary | ICD-10-CM | POA: Diagnosis not present

## 2022-08-08 ENCOUNTER — Other Ambulatory Visit (HOSPITAL_COMMUNITY): Payer: Self-pay

## 2022-08-08 ENCOUNTER — Other Ambulatory Visit: Payer: Medicare Other

## 2022-08-08 ENCOUNTER — Ambulatory Visit: Payer: Medicare Other | Admitting: Internal Medicine

## 2022-08-13 ENCOUNTER — Other Ambulatory Visit: Payer: Self-pay | Admitting: Obstetrics & Gynecology

## 2022-08-13 ENCOUNTER — Other Ambulatory Visit: Payer: Self-pay | Admitting: Physical Medicine and Rehabilitation

## 2022-08-13 DIAGNOSIS — Z1231 Encounter for screening mammogram for malignant neoplasm of breast: Secondary | ICD-10-CM

## 2022-08-15 ENCOUNTER — Other Ambulatory Visit: Payer: Self-pay

## 2022-08-15 ENCOUNTER — Other Ambulatory Visit (HOSPITAL_COMMUNITY): Payer: Self-pay

## 2022-08-16 DIAGNOSIS — M25561 Pain in right knee: Secondary | ICD-10-CM | POA: Diagnosis not present

## 2022-08-17 ENCOUNTER — Other Ambulatory Visit (HOSPITAL_COMMUNITY): Payer: Self-pay

## 2022-08-28 DIAGNOSIS — L821 Other seborrheic keratosis: Secondary | ICD-10-CM | POA: Diagnosis not present

## 2022-08-28 DIAGNOSIS — D225 Melanocytic nevi of trunk: Secondary | ICD-10-CM | POA: Diagnosis not present

## 2022-08-28 DIAGNOSIS — L570 Actinic keratosis: Secondary | ICD-10-CM | POA: Diagnosis not present

## 2022-08-28 DIAGNOSIS — L281 Prurigo nodularis: Secondary | ICD-10-CM | POA: Diagnosis not present

## 2022-08-30 ENCOUNTER — Other Ambulatory Visit (HOSPITAL_COMMUNITY): Payer: Self-pay

## 2022-08-30 DIAGNOSIS — C3492 Malignant neoplasm of unspecified part of left bronchus or lung: Secondary | ICD-10-CM | POA: Diagnosis not present

## 2022-08-31 ENCOUNTER — Ambulatory Visit
Admission: RE | Admit: 2022-08-31 | Discharge: 2022-08-31 | Disposition: A | Discharge status: Home / Self Care | Payer: Medicare Other | Source: Ambulatory Visit | Attending: Obstetrics & Gynecology | Admitting: Obstetrics & Gynecology

## 2022-08-31 DIAGNOSIS — Z1231 Encounter for screening mammogram for malignant neoplasm of breast: Secondary | ICD-10-CM | POA: Diagnosis not present

## 2022-09-11 ENCOUNTER — Other Ambulatory Visit (HOSPITAL_COMMUNITY): Payer: Self-pay

## 2022-09-12 ENCOUNTER — Other Ambulatory Visit (HOSPITAL_COMMUNITY): Payer: Self-pay

## 2022-10-04 ENCOUNTER — Other Ambulatory Visit (HOSPITAL_COMMUNITY): Payer: Self-pay

## 2022-10-05 DIAGNOSIS — R350 Frequency of micturition: Secondary | ICD-10-CM | POA: Diagnosis not present

## 2022-10-05 DIAGNOSIS — R300 Dysuria: Secondary | ICD-10-CM | POA: Diagnosis not present

## 2022-10-05 DIAGNOSIS — N3000 Acute cystitis without hematuria: Secondary | ICD-10-CM | POA: Diagnosis not present

## 2022-10-08 ENCOUNTER — Other Ambulatory Visit: Payer: Self-pay

## 2022-10-08 ENCOUNTER — Other Ambulatory Visit (HOSPITAL_COMMUNITY): Payer: Self-pay

## 2022-10-08 ENCOUNTER — Other Ambulatory Visit: Payer: Self-pay | Admitting: Internal Medicine

## 2022-10-08 DIAGNOSIS — C3492 Malignant neoplasm of unspecified part of left bronchus or lung: Secondary | ICD-10-CM

## 2022-10-08 MED ORDER — OSIMERTINIB MESYLATE 80 MG PO TABS
ORAL_TABLET | Freq: Every day | ORAL | 2 refills | Status: DC
  Filled 2022-10-08: qty 30, 30d supply, fill #0
  Filled 2022-10-31: qty 30, 30d supply, fill #1
  Filled 2022-12-11: qty 30, 30d supply, fill #2

## 2022-10-09 ENCOUNTER — Telehealth: Payer: Self-pay | Admitting: Medical Oncology

## 2022-10-09 ENCOUNTER — Other Ambulatory Visit (HOSPITAL_COMMUNITY): Payer: Self-pay

## 2022-10-09 ENCOUNTER — Other Ambulatory Visit: Payer: Self-pay

## 2022-10-09 NOTE — Telephone Encounter (Signed)
Schedule message sent to change appts per pt request.

## 2022-10-10 ENCOUNTER — Other Ambulatory Visit (HOSPITAL_COMMUNITY): Payer: Self-pay

## 2022-10-10 ENCOUNTER — Encounter: Payer: Self-pay | Admitting: Interventional Cardiology

## 2022-10-10 NOTE — Telephone Encounter (Signed)
Error

## 2022-10-10 NOTE — Telephone Encounter (Signed)
Pt requesting provider switch to Dr. Diona Browner. Reason being that the [place] office is closer for her. Please advise

## 2022-10-11 ENCOUNTER — Other Ambulatory Visit: Payer: Self-pay

## 2022-10-19 ENCOUNTER — Ambulatory Visit (HOSPITAL_COMMUNITY): Payer: Medicare Other

## 2022-10-19 ENCOUNTER — Other Ambulatory Visit: Payer: Medicare Other

## 2022-10-23 ENCOUNTER — Ambulatory Visit: Payer: Medicare Other | Admitting: Internal Medicine

## 2022-10-24 ENCOUNTER — Encounter (HOSPITAL_COMMUNITY): Payer: Self-pay

## 2022-10-24 ENCOUNTER — Ambulatory Visit (HOSPITAL_COMMUNITY)
Admission: RE | Admit: 2022-10-24 | Discharge: 2022-10-24 | Disposition: A | Discharge status: Home / Self Care | Payer: Medicare Other | Source: Ambulatory Visit | Attending: Internal Medicine | Admitting: Internal Medicine

## 2022-10-24 ENCOUNTER — Inpatient Hospital Stay: Payer: Medicare Other | Attending: Internal Medicine

## 2022-10-24 DIAGNOSIS — C3490 Malignant neoplasm of unspecified part of unspecified bronchus or lung: Secondary | ICD-10-CM | POA: Diagnosis not present

## 2022-10-24 DIAGNOSIS — Z79899 Other long term (current) drug therapy: Secondary | ICD-10-CM | POA: Insufficient documentation

## 2022-10-24 DIAGNOSIS — Z9221 Personal history of antineoplastic chemotherapy: Secondary | ICD-10-CM | POA: Insufficient documentation

## 2022-10-24 DIAGNOSIS — J90 Pleural effusion, not elsewhere classified: Secondary | ICD-10-CM | POA: Insufficient documentation

## 2022-10-24 DIAGNOSIS — K449 Diaphragmatic hernia without obstruction or gangrene: Secondary | ICD-10-CM | POA: Diagnosis not present

## 2022-10-24 DIAGNOSIS — Z923 Personal history of irradiation: Secondary | ICD-10-CM | POA: Insufficient documentation

## 2022-10-24 DIAGNOSIS — I700 Atherosclerosis of aorta: Secondary | ICD-10-CM | POA: Diagnosis not present

## 2022-10-24 DIAGNOSIS — C3412 Malignant neoplasm of upper lobe, left bronchus or lung: Secondary | ICD-10-CM | POA: Insufficient documentation

## 2022-10-24 LAB — CBC WITH DIFFERENTIAL (CANCER CENTER ONLY)
Abs Immature Granulocytes: 0.02 10*3/uL (ref 0.00–0.07)
Basophils Absolute: 0 10*3/uL (ref 0.0–0.1)
Basophils Relative: 0 %
Eosinophils Absolute: 0.2 10*3/uL (ref 0.0–0.5)
Eosinophils Relative: 3 %
HCT: 37.9 % (ref 36.0–46.0)
Hemoglobin: 12.8 g/dL (ref 12.0–15.0)
Immature Granulocytes: 0 %
Lymphocytes Relative: 18 %
Lymphs Abs: 1 10*3/uL (ref 0.7–4.0)
MCH: 32.4 pg (ref 26.0–34.0)
MCHC: 33.8 g/dL (ref 30.0–36.0)
MCV: 95.9 fL (ref 80.0–100.0)
Monocytes Absolute: 0.5 10*3/uL (ref 0.1–1.0)
Monocytes Relative: 10 %
Neutro Abs: 3.6 10*3/uL (ref 1.7–7.7)
Neutrophils Relative %: 69 %
Platelet Count: 130 10*3/uL — ABNORMAL LOW (ref 150–400)
RBC: 3.95 MIL/uL (ref 3.87–5.11)
RDW: 13 % (ref 11.5–15.5)
WBC Count: 5.2 10*3/uL (ref 4.0–10.5)
nRBC: 0 % (ref 0.0–0.2)

## 2022-10-24 LAB — CMP (CANCER CENTER ONLY)
ALT: 12 U/L (ref 0–44)
AST: 17 U/L (ref 15–41)
Albumin: 4.1 g/dL (ref 3.5–5.0)
Alkaline Phosphatase: 62 U/L (ref 38–126)
Anion gap: 5 (ref 5–15)
BUN: 15 mg/dL (ref 8–23)
CO2: 30 mmol/L (ref 22–32)
Calcium: 9.1 mg/dL (ref 8.9–10.3)
Chloride: 107 mmol/L (ref 98–111)
Creatinine: 1.07 mg/dL — ABNORMAL HIGH (ref 0.44–1.00)
GFR, Estimated: 56 mL/min — ABNORMAL LOW (ref >60)
Glucose, Bld: 97 mg/dL (ref 70–99)
Potassium: 3.9 mmol/L (ref 3.5–5.1)
Sodium: 142 mmol/L (ref 135–145)
Total Bilirubin: 0.3 mg/dL (ref 0.3–1.2)
Total Protein: 6.3 g/dL — ABNORMAL LOW (ref 6.5–8.1)

## 2022-10-24 MED ORDER — IOHEXOL 300 MG/ML  SOLN
75.0000 mL | Freq: Once | INTRAMUSCULAR | Status: AC | PRN
  Administered 2022-10-24: 75 mL via INTRAVENOUS

## 2022-10-24 MED ORDER — SODIUM CHLORIDE (PF) 0.9 % IJ SOLN
INTRAMUSCULAR | Status: AC
  Filled 2022-10-24: qty 50

## 2022-10-29 ENCOUNTER — Inpatient Hospital Stay: Payer: Medicare Other | Admitting: Internal Medicine

## 2022-10-29 ENCOUNTER — Other Ambulatory Visit: Payer: Self-pay

## 2022-10-29 VITALS — BP 112/72 | HR 80 | Temp 98.1°F | Resp 18 | Ht 65.0 in | Wt 166.6 lb

## 2022-10-29 DIAGNOSIS — J90 Pleural effusion, not elsewhere classified: Secondary | ICD-10-CM | POA: Diagnosis not present

## 2022-10-29 DIAGNOSIS — C3492 Malignant neoplasm of unspecified part of left bronchus or lung: Secondary | ICD-10-CM

## 2022-10-29 DIAGNOSIS — Z9221 Personal history of antineoplastic chemotherapy: Secondary | ICD-10-CM | POA: Diagnosis not present

## 2022-10-29 DIAGNOSIS — Z923 Personal history of irradiation: Secondary | ICD-10-CM | POA: Diagnosis not present

## 2022-10-29 DIAGNOSIS — C3412 Malignant neoplasm of upper lobe, left bronchus or lung: Secondary | ICD-10-CM | POA: Diagnosis not present

## 2022-10-29 DIAGNOSIS — Z79899 Other long term (current) drug therapy: Secondary | ICD-10-CM | POA: Diagnosis not present

## 2022-10-29 NOTE — Progress Notes (Signed)
[facility] [phone]   Fax:(336) [phone]  OFFICE PROGRESS NOTE  Gareth Morgan, MD [address] [place] [address]  DIAGNOSIS: Stage IIIc (T3, N3, M0) non-small cell lung cancer, adenocarcinoma presented with large left upper lobe lung mass in addition to left infrahilar and right hilar and subcarinal lymphadenopathy diagnosed in August 2021.   Molecular Biomarkers: Insufficient tissue for foundation 1. Guardant 360 results.  DETECTED ALTERATION(S) / BIOMARKER(S) % CFDNA OR AMPLIFICATION ASSOCIATED FDA-APPROVED THERAPIES CLINICAL TRIAL AVAILABILITY  EGFR L858R, 1.2%, Afatinib, Dacomitinib, Erlotinib, Gefitinib, Osimertinib, Ramucirumab Yes EGFRL833V, 1.0%, Afatinib, Dacomitinib, Erlotinib, Gefitinib, Osimertinib, Ramucirumab Yes [id number] 1.0% None  No   PRIOR THERAPY: Weekly concurrent chemoradiation with carboplatin for an AUC of 2 and paclitaxel 45 mg/m2.  First dose expected on 11/16/2019. Status post 7 cycles.   CURRENT THERAPY: Tagrisso 80 mg p.o. daily. First dose started 01/31/2020.  Status post 32 months of treatment.  INTERVAL HISTORY: Linda Woods 72 y.o. female returns to the clinic today for follow-up visit.  The patient is feeling fine today with no concerning complaints except for shortness of breath with exertion.  She denied having any chest pain, cough or hemoptysis.  She has no nausea, vomiting, diarrhea or constipation.  She has no headache or visual changes.  She has no recent weight loss or night sweats.  She denied having any fever or chills.  She has been tolerating her treatment with Tagrisso fairly well.  She is here today for evaluation with repeat CT scan of the chest for restaging of her disease.  MEDICAL HISTORY: Past Medical History:  Diagnosis Date   Anemia    as a child   Anxiety    Asthma 10/19/2020   Bursitis    Chronic reflux esophagitis    Complication of anesthesia    Diarrhea, functional     Diverticulosis    GERD (gastroesophageal reflux disease)    History of kidney stones    Hypertension    Knee pain    right knee-seeing ortho   lung ca 09/2019   Osteoarthritis    Plantar fasciitis    PONV (postoperative nausea and vomiting)    has used the patch before and that helps   Pure hypercholesterolemia    RLS (restless legs syndrome)    Superficial vein thrombosis    Thyroid disease    hypothyroid    ALLERGIES:  has No Known Allergies.  MEDICATIONS:  Current Outpatient Medications  Medication Sig Dispense Refill   albuterol (VENTOLIN HFA) 108 (90 Base) MCG/ACT inhaler Inhale 2 puffs into the lungs every 6 (six) hours as needed for wheezing or shortness of breath. 8.5 g 0   cholecalciferol (VITAMIN D3) 25 MCG (1000 UNIT) tablet Take 1,000 Units by mouth daily. Pt states she take 2000 units/day     cyclobenzaprine (FLEXERIL) 10 MG tablet Take 10 mg by mouth 3 (three) times daily as needed for muscle spasms.     famotidine (PEPCID) 40 MG tablet Take 40 mg by mouth See admin instructions. 4 times a week     ferrous sulfate 325 (65 FE) MG tablet Take 325 mg by mouth daily with breakfast.     fexofenadine (ALLEGRA) 180 MG tablet Take 180 mg by mouth daily.     fluticasone (FLONASE) 50 MCG/ACT nasal spray Place 2 sprays into both nostrils as needed for allergies.     HYDROcodone-acetaminophen (NORCO/VICODIN) 5-325 MG tablet SMARTSIG:1 Tablet(s) By Mouth 1-4 Times Daily  HYDROcodone-homatropine (HYCODAN) 5-1.5 MG/5ML syrup Take 5 mLs by mouth daily as needed for cough.     lidocaine (LIDODERM) 5 % 1 patch daily.     loperamide (IMODIUM) 2 MG capsule Take 2-4 mg by mouth every 6 (six) hours as needed.     LORazepam (ATIVAN) 0.5 MG tablet Take 0.5 mg by mouth at bedtime.      Magnesium-Potassium 250-100 MG TABS Take 1 tablet by mouth daily.     melatonin 5 MG TABS Take 5 mg by mouth at bedtime.     metoprolol succinate (TOPROL-XL) 50 MG 24 hr tablet Take 50 mg by mouth  daily.      MYRBETRIQ 50 MG TB24 tablet Take 50 mg by mouth daily.     NEOMYCIN-POLYMYXIN-HYDROCORTISONE (CORTISPORIN) 1 % SOLN OTIC solution Apply 1-2 drops to toe BID after soaking 10 mL 1   osimertinib mesylate (TAGRISSO) 80 MG tablet TAKE 1 TABLET BY MOUTH DAILY. 30 tablet 2   oxymetazoline (AFRIN) 0.05 % nasal spray Place 1 spray into both nostrils daily as needed for congestion.     PREMARIN vaginal cream Place vaginally.     RABEprazole (ACIPHEX) 20 MG tablet Take 1 tablet (20 mg total) by mouth 2 (two) times a week.     rosuvastatin (CRESTOR) 5 MG tablet Take 5 mg by mouth daily.     SYNTHROID 112 MCG tablet Take 112 mcg by mouth daily before breakfast.      triamcinolone cream (KENALOG) 0.1 % Apply 1 Application topically 2 (two) times daily. As needed for itching/rash 453.6 g 0   vitamin B-12 (CYANOCOBALAMIN) 1000 MCG tablet Take 1,000 mcg by mouth daily.     No current facility-administered medications for this visit.    SURGICAL HISTORY:  Past Surgical History:  Procedure Laterality Date   APPENDECTOMY     BRONCHIAL BIOPSY  10/20/2019   Procedure: BRONCHIAL BIOPSIES;  Surgeon: Leslye Peer, MD;  Location[facility] ENDOSCOPY;  Service: Pulmonary;;   BRONCHIAL BRUSHINGS  10/20/2019   Procedure: BRONCHIAL BRUSHINGS;  Surgeon: Leslye Peer, MD;  Location: [facility] ENDOSCOPY;  Service: Pulmonary;;   BRONCHIAL NEEDLE ASPIRATION BIOPSY  10/20/2019   Procedure: BRONCHIAL NEEDLE ASPIRATION BIOPSIES;  Surgeon: Leslye Peer, MD;  Location: [facility] ENDOSCOPY;  Service: Pulmonary;;   BRONCHIAL WASHINGS  10/20/2019   Procedure: BRONCHIAL WASHINGS;  Surgeon: Leslye Peer, MD;  Location: [facility] ENDOSCOPY;  Service: Pulmonary;;   CARPAL TUNNEL RELEASE Right 2011   Dr. Amanda Pea   COLPORRHAPHY     posterior   HAND SURGERY Right 2016   Nerve surgery, Dr. Amanda Pea   OVARIAN CYST REMOVAL     RECTOCELE REPAIR  2011   w/TVH and sling   TONSILLECTOMY     TONSILLECTOMY     TOTAL KNEE ARTHROPLASTY Left  09/09/2018   Procedure: TOTAL KNEE ARTHROPLASTY, CORTISONE INJECTION RIGHT KNEE;  Surgeon: Durene Romans, MD;  Location: [facility] ORS;  Service: Orthopedics;  Laterality: Left;  70 mins   TOTAL VAGINAL HYSTERECTOMY  10/18/2009   rectocele repair, sling   TUBAL LIGATION Bilateral    VARICOSE VEIN SURGERY     VIDEO BRONCHOSCOPY WITH ENDOBRONCHIAL NAVIGATION N/A 10/20/2019   Procedure: VIDEO BRONCHOSCOPY WITH ENDOBRONCHIAL NAVIGATION;  Surgeon: Leslye Peer, MD;  Location: [facility] ENDOSCOPY;  Service: Pulmonary;  Laterality: N/A;    REVIEW OF SYSTEMS:  Constitutional: negative Eyes: negative Ears, nose, mouth, throat, and face: negative Respiratory: positive for dyspnea on exertion Cardiovascular: negative Gastrointestinal: negative Genitourinary:negative Integument/breast: negative Hematologic/lymphatic:  negative Musculoskeletal:negative Neurological: negative Behavioral/Psych: negative Endocrine: negative Allergic/Immunologic: negative   PHYSICAL EXAMINATION: General appearance: alert, cooperative, fatigued, and no distress Head: Normocephalic, without obvious abnormality, atraumatic Neck: no adenopathy, no JVD, supple, symmetrical, trachea midline, and thyroid not enlarged, symmetric, no tenderness/mass/nodules Lymph nodes: Cervical, supraclavicular, and axillary nodes normal. Resp: clear to auscultation bilaterally Back: symmetric, no curvature. ROM normal. No CVA tenderness. Cardio: regular rate and rhythm, S1, S2 normal, no murmur, click, rub or gallop GI: soft, non-tender; bowel sounds normal; no masses,  no organomegaly Extremities: extremities normal, atraumatic, no cyanosis or edema Neurologic: Alert and oriented X 3, normal strength and tone. Normal symmetric reflexes. Normal coordination and gait  ECOG PERFORMANCE STATUS: 1 - Symptomatic but completely ambulatory  Blood pressure 112/72, pulse 80, temperature 98.1 F (36.7 C), temperature source Oral, resp. rate 18, height 5\' 5"   (1.651 m), weight 166 lb 9.6 oz (75.6 kg), last menstrual period 08/21/2002, SpO2 99%.  LABORATORY DATA: Lab Results  Component Value Date   WBC 5.2 10/24/2022   HGB 12.8 10/24/2022   HCT 37.9 10/24/2022   MCV 95.9 10/24/2022   PLT 130 (L) 10/24/2022      Chemistry      Component Value Date/Time   NA 142 10/24/2022 1325   K 3.9 10/24/2022 1325   CL 107 10/24/2022 1325   CO2 30 10/24/2022 1325   BUN 15 10/24/2022 1325   CREATININE 1.07 (H) 10/24/2022 1325      Component Value Date/Time   CALCIUM 9.1 10/24/2022 1325   ALKPHOS 62 10/24/2022 1325   AST 17 10/24/2022 1325   ALT 12 10/24/2022 1325   BILITOT 0.3 10/24/2022 1325       RADIOGRAPHIC STUDIES: No results found.  ASSESSMENT AND PLAN: This is a very pleasant 72 years old white female recently diagnosed with a stage IIIc/IV (T3, N3, M0/M1a) non-small cell lung cancer, adenocarcinoma presented with large left upper lobe lung mass in addition to left infrahilar, right hilar and subcarinal lymphadenopathy and small left pleural effusion diagnosed in August 2021 with positive EGFR mutation in exon 21 (L858R). The patient completed a course of concurrent chemoradiation with weekly carboplatin and paclitaxel status post 7 cycles.  She tolerated her treatment well except for fatigue as well as a skin burn and cough. Her scan showed mild improvement of the left upper lobe lung mass as well as the mediastinal lymphadenopathy.  The patient continues to have small left pleural effusion that is suspicious given her presentation. She is currently on treatment with Tagrisso 80 mg p.o. daily started on January 31, 2020.  She is status post 32 months of treatment.   She has been tolerating this treatment well with no concerning adverse effects. She had repeat CT scan of the chest performed last week but the final report is still pending.  I personally and independently reviewed the scan images and discussed the result with the patient  today.  I do not see any clear evidence for disease progression but I will wait for the final report for confirmation. I recommended for the patient to continue her current treatment with Tagrisso with the same dose. I will see her back for follow-up visit in 3 months for evaluation and repeat blood work. She was advised to call immediately if she has any concerning symptoms in the interval. The patient voices understanding of current disease status and treatment options and is in agreement with the current care plan.  All questions were answered. The patient knows  to call the clinic with any problems, questions or concerns. We can certainly see the patient much sooner if necessary.  Disclaimer: This note was dictated with voice recognition software. Similar sounding words can inadvertently be transcribed and may not be corrected upon review.

## 2022-10-31 ENCOUNTER — Other Ambulatory Visit (HOSPITAL_COMMUNITY): Payer: Self-pay

## 2022-11-01 ENCOUNTER — Ambulatory Visit (HOSPITAL_BASED_OUTPATIENT_CLINIC_OR_DEPARTMENT_OTHER): Payer: Medicare Other | Admitting: Obstetrics & Gynecology

## 2022-11-01 ENCOUNTER — Encounter (HOSPITAL_BASED_OUTPATIENT_CLINIC_OR_DEPARTMENT_OTHER): Payer: Self-pay | Admitting: Obstetrics & Gynecology

## 2022-11-01 VITALS — BP 116/64 | HR 79 | Ht 65.0 in | Wt 164.0 lb

## 2022-11-01 DIAGNOSIS — E039 Hypothyroidism, unspecified: Secondary | ICD-10-CM | POA: Diagnosis not present

## 2022-11-01 DIAGNOSIS — Z90710 Acquired absence of both cervix and uterus: Secondary | ICD-10-CM

## 2022-11-01 DIAGNOSIS — E2839 Other primary ovarian failure: Secondary | ICD-10-CM

## 2022-11-01 DIAGNOSIS — Z01419 Encounter for gynecological examination (general) (routine) without abnormal findings: Secondary | ICD-10-CM | POA: Diagnosis not present

## 2022-11-01 DIAGNOSIS — E785 Hyperlipidemia, unspecified: Secondary | ICD-10-CM

## 2022-11-01 NOTE — Progress Notes (Signed)
72 y.o. [id number] Married White or Caucasian female here for breast and pelvic exam.  I am also following her for h/o PMP status.  Denies vaginal bleeding.  Continues to follow with Dr. Arbutus Ped with oncology.  PCP, Dr. Sudie Bailey, retired.  He closed his practice.  .  Patient's last menstrual period was 08/21/2002.          Sexually active: No.  H/O STD:  no  Health Maintenance: PCP: Dr. Sudie Bailey.  Last appt was over a hear ago.   Vaccines are up to date:  pt reports she is up to date Colonoscopy:  06/07/2019, follow up 5 years MMG:  08/31/22 BMD:  normal 2019  will order for pt Last pap smear:  not indicated due to hysterectomy.   H/o abnormal pap smear:  no    reports that she has never smoked. She has never used smokeless tobacco. She reports current alcohol use of about 2.0 - 3.0 standard drinks of alcohol per week. She reports that she does not use drugs.  Past Medical History:  Diagnosis Date   Anemia    as a child   Anxiety    Asthma 10/19/2020   Bursitis    Chronic reflux esophagitis    Complication of anesthesia    Diarrhea, functional    Diverticulosis    GERD (gastroesophageal reflux disease)    History of kidney stones    Hypertension    Knee pain    right knee-seeing ortho   lung ca 09/2019   Osteoarthritis    Plantar fasciitis    PONV (postoperative nausea and vomiting)    has used the patch before and that helps   Pure hypercholesterolemia    RLS (restless legs syndrome)    Superficial vein thrombosis    Thyroid disease    hypothyroid    Past Surgical History:  Procedure Laterality Date   APPENDECTOMY     BRONCHIAL BIOPSY  10/20/2019   Procedure: BRONCHIAL BIOPSIES;  Surgeon: Leslye Peer, MD;  Location: [facility] ENDOSCOPY;  Service: Pulmonary;;   BRONCHIAL BRUSHINGS  10/20/2019   Procedure: BRONCHIAL BRUSHINGS;  Surgeon: Leslye Peer, MD;  Location: [facility] ENDOSCOPY;  Service: Pulmonary;;   BRONCHIAL NEEDLE ASPIRATION BIOPSY  10/20/2019   Procedure:  BRONCHIAL NEEDLE ASPIRATION BIOPSIES;  Surgeon: Leslye Peer, MD;  Location: [facility] ENDOSCOPY;  Service: Pulmonary;;   BRONCHIAL WASHINGS  10/20/2019   Procedure: BRONCHIAL WASHINGS;  Surgeon: Leslye Peer, MD;  Location: [facility] ENDOSCOPY;  Service: Pulmonary;;   CARPAL TUNNEL RELEASE Right 2011   Dr. Amanda Pea   COLPORRHAPHY     posterior   HAND SURGERY Right 2016   Nerve surgery, Dr. Amanda Pea   OVARIAN CYST REMOVAL     RECTOCELE REPAIR  2011   w/TVH and sling   TONSILLECTOMY     TONSILLECTOMY     TOTAL KNEE ARTHROPLASTY Left 09/09/2018   Procedure: TOTAL KNEE ARTHROPLASTY, CORTISONE INJECTION RIGHT KNEE;  Surgeon: Durene Romans, MD;  Location: [facility] ORS;  Service: Orthopedics;  Laterality: Left;  70 mins   TOTAL VAGINAL HYSTERECTOMY  10/18/2009   rectocele repair, sling   TUBAL LIGATION Bilateral    VARICOSE VEIN SURGERY     VIDEO BRONCHOSCOPY WITH ENDOBRONCHIAL NAVIGATION N/A 10/20/2019   Procedure: VIDEO BRONCHOSCOPY WITH ENDOBRONCHIAL NAVIGATION;  Surgeon: Leslye Peer, MD;  Location: [facility] ENDOSCOPY;  Service: Pulmonary;  Laterality: N/A;    Current Outpatient Medications  Medication Sig Dispense Refill   albuterol (VENTOLIN HFA) 108 (90 Base) MCG/ACT  inhaler Inhale 2 puffs into the lungs every 6 (six) hours as needed for wheezing or shortness of breath. 8.5 g 0   cholecalciferol (VITAMIN D3) 25 MCG (1000 UNIT) tablet Take 1,000 Units by mouth daily. Pt states she take 2000 units/day     cyclobenzaprine (FLEXERIL) 10 MG tablet Take 10 mg by mouth 3 (three) times daily as needed for muscle spasms.     famotidine (PEPCID) 40 MG tablet Take 40 mg by mouth See admin instructions. 4 times a week     ferrous sulfate 325 (65 FE) MG tablet Take 325 mg by mouth daily with breakfast.     fexofenadine (ALLEGRA) 180 MG tablet Take 180 mg by mouth daily.     fluticasone (FLONASE) 50 MCG/ACT nasal spray Place 2 sprays into both nostrils as needed for allergies.     HYDROcodone-homatropine (HYCODAN) 5-1.5  MG/5ML syrup Take 5 mLs by mouth daily as needed for cough.     lidocaine (LIDODERM) 5 % 1 patch daily.     loperamide (IMODIUM) 2 MG capsule Take 2-4 mg by mouth every 6 (six) hours as needed.     LORazepam (ATIVAN) 0.5 MG tablet Take 0.5 mg by mouth at bedtime.      Magnesium-Potassium 250-100 MG TABS Take 1 tablet by mouth daily.     melatonin 5 MG TABS Take 5 mg by mouth at bedtime.     metoprolol succinate (TOPROL-XL) 50 MG 24 hr tablet Take 50 mg by mouth daily.      MYRBETRIQ 50 MG TB24 tablet Take 50 mg by mouth daily.     NEOMYCIN-POLYMYXIN-HYDROCORTISONE (CORTISPORIN) 1 % SOLN OTIC solution Apply 1-2 drops to toe BID after soaking 10 mL 1   osimertinib mesylate (TAGRISSO) 80 MG tablet TAKE 1 TABLET BY MOUTH DAILY. 30 tablet 2   oxymetazoline (AFRIN) 0.05 % nasal spray Place 1 spray into both nostrils daily as needed for congestion.     PREMARIN vaginal cream Place vaginally.     RABEprazole (ACIPHEX) 20 MG tablet Take 1 tablet (20 mg total) by mouth 2 (two) times a week.     rosuvastatin (CRESTOR) 5 MG tablet Take 5 mg by mouth daily.     SYNTHROID 112 MCG tablet Take 112 mcg by mouth daily before breakfast.      triamcinolone cream (KENALOG) 0.1 % Apply 1 Application topically 2 (two) times daily. As needed for itching/rash 453.6 g 0   vitamin B-12 (CYANOCOBALAMIN) 1000 MCG tablet Take 1,000 mcg by mouth daily.     No current facility-administered medications for this visit.    Family History  Problem Relation Age of Onset   Cervical cancer Mother    Heart failure Mother    Heart failure Father    Diabetes Father    Cancer Brother    Breast cancer Maternal Aunt    Breast cancer Paternal Aunt     Review of Systems  Constitutional: Negative.   Genitourinary: Negative.     Exam:   BP 116/64 (BP Location: Left Arm, Patient Position: Sitting, Cuff Size: Normal)   Pulse 79   Ht 5\' 5"  (1.651 m)   Wt 164 lb (74.4 kg)   LMP 08/21/2002   BMI 27.29 kg/m   Height: 5\' 5"   (165.1 cm)  General appearance: alert, cooperative and appears stated age Breasts: normal appearance, no masses or tenderness Abdomen: soft, non-tender; bowel sounds normal; no masses,  no organomegaly Lymph nodes: Cervical, supraclavicular, and axillary nodes normal.  No  abnormal inguinal nodes palpated Neurologic: Grossly normal  Pelvic: External genitalia:  no lesions              Urethra:  normal appearing urethra with no masses, tenderness or lesions              Bartholins and Skenes: normal                 Vagina: normal appearing vagina with atrophic changes and no discharge, no lesions              Cervix: absent              Pap taken: No. Bimanual Exam:  Uterus:  uterus absent              Adnexa: no mass, fullness, tenderness               Rectovaginal: Confirms               Anus:  normal sphincter tone, no lesions  Chaperone, Hendricks Milo, CMA, was present for exam.  Assessment/Plan: 1. Encntr for gyn exam (general) (routine) w/o abn findings - Pap smear not indicated - Mammogram 11/13/2022 - Colonoscopy 2021.  Due 5 years.  Dr. Kinnie Scales retired.  Will need referral next year - Bone mineral density updated - lab work ordered - vaccines reviewed/updated.  She is going to send me dates to I can update Epic.    2. Hypoestrogenism - DG BONE DENSITY (DXA); Future  3. Elevated lipids - Hemoglobin A1c - Lipid panel  4. Acquired hypothyroidism - TSH  5. H/O: hysterectomy

## 2022-11-02 DIAGNOSIS — M1990 Unspecified osteoarthritis, unspecified site: Secondary | ICD-10-CM | POA: Diagnosis not present

## 2022-11-02 DIAGNOSIS — I10 Essential (primary) hypertension: Secondary | ICD-10-CM | POA: Diagnosis not present

## 2022-11-02 DIAGNOSIS — K219 Gastro-esophageal reflux disease without esophagitis: Secondary | ICD-10-CM | POA: Diagnosis not present

## 2022-11-02 DIAGNOSIS — E039 Hypothyroidism, unspecified: Secondary | ICD-10-CM | POA: Diagnosis not present

## 2022-11-02 LAB — LIPID PANEL
Chol/HDL Ratio: 3.7 ratio (ref 0.0–4.4)
Cholesterol, Total: 209 mg/dL — ABNORMAL HIGH (ref 100–199)
HDL: 57 mg/dL (ref >39)
LDL Chol Calc (NIH): 104 mg/dL — ABNORMAL HIGH (ref 0–99)
Triglycerides: 282 mg/dL — ABNORMAL HIGH (ref 0–149)
VLDL Cholesterol Cal: 48 mg/dL — ABNORMAL HIGH (ref 5–40)

## 2022-11-02 LAB — HEMOGLOBIN A1C
Est. average glucose Bld gHb Est-mCnc: 114 mg/dL
Hgb A1c MFr Bld: 5.6 % (ref 4.8–5.6)

## 2022-11-02 LAB — TSH: TSH: 0.398 u[IU]/mL — ABNORMAL LOW (ref 0.450–4.500)

## 2022-11-03 ENCOUNTER — Encounter: Payer: Self-pay | Admitting: Internal Medicine

## 2022-11-05 ENCOUNTER — Other Ambulatory Visit: Payer: Self-pay | Admitting: Internal Medicine

## 2022-11-05 DIAGNOSIS — C3490 Malignant neoplasm of unspecified part of unspecified bronchus or lung: Secondary | ICD-10-CM

## 2022-11-08 ENCOUNTER — Telehealth: Payer: Self-pay | Admitting: Internal Medicine

## 2022-11-13 ENCOUNTER — Other Ambulatory Visit (HOSPITAL_BASED_OUTPATIENT_CLINIC_OR_DEPARTMENT_OTHER): Payer: Self-pay | Admitting: *Deleted

## 2022-11-13 MED ORDER — LEVOTHYROXINE SODIUM 100 MCG PO TABS: 100.0000 ug | ORAL_TABLET | Freq: Every day | ORAL | 0 refills | Status: AC

## 2022-11-15 ENCOUNTER — Other Ambulatory Visit: Payer: Self-pay

## 2022-11-17 ENCOUNTER — Other Ambulatory Visit: Payer: Self-pay

## 2022-11-26 ENCOUNTER — Encounter (HOSPITAL_BASED_OUTPATIENT_CLINIC_OR_DEPARTMENT_OTHER): Payer: Self-pay | Admitting: Obstetrics & Gynecology

## 2022-11-26 NOTE — Telephone Encounter (Signed)
Pt needing pcp and phone number was given for [facility].

## 2022-12-04 DIAGNOSIS — M1711 Unilateral primary osteoarthritis, right knee: Secondary | ICD-10-CM | POA: Diagnosis not present

## 2022-12-11 ENCOUNTER — Other Ambulatory Visit: Payer: Self-pay

## 2022-12-11 NOTE — Progress Notes (Signed)
Specialty Pharmacy Refill Coordination Note  Rafaella Shadduck is a 72 y.o. female contacted today regarding refills of specialty medication(s) Osimertinib Mesylate   Patient requested Delivery   Delivery date: 12/20/22   Verified address: [address], [place], [phone]   Medication will be filled on 12/19/22.

## 2022-12-24 ENCOUNTER — Ambulatory Visit (HOSPITAL_COMMUNITY)
Admission: RE | Admit: 2022-12-24 | Discharge: 2022-12-24 | Disposition: A | Discharge status: Home / Self Care | Payer: Medicare Other | Source: Ambulatory Visit | Attending: Internal Medicine | Admitting: Internal Medicine

## 2022-12-24 ENCOUNTER — Inpatient Hospital Stay: Payer: Medicare Other | Attending: Internal Medicine

## 2022-12-24 DIAGNOSIS — R918 Other nonspecific abnormal finding of lung field: Secondary | ICD-10-CM | POA: Diagnosis not present

## 2022-12-24 DIAGNOSIS — J479 Bronchiectasis, uncomplicated: Secondary | ICD-10-CM | POA: Diagnosis not present

## 2022-12-24 DIAGNOSIS — Z9221 Personal history of antineoplastic chemotherapy: Secondary | ICD-10-CM | POA: Diagnosis not present

## 2022-12-24 DIAGNOSIS — C3490 Malignant neoplasm of unspecified part of unspecified bronchus or lung: Secondary | ICD-10-CM | POA: Insufficient documentation

## 2022-12-24 DIAGNOSIS — R590 Localized enlarged lymph nodes: Secondary | ICD-10-CM | POA: Diagnosis not present

## 2022-12-24 DIAGNOSIS — J90 Pleural effusion, not elsewhere classified: Secondary | ICD-10-CM | POA: Insufficient documentation

## 2022-12-24 DIAGNOSIS — Z923 Personal history of irradiation: Secondary | ICD-10-CM | POA: Insufficient documentation

## 2022-12-24 DIAGNOSIS — C3412 Malignant neoplasm of upper lobe, left bronchus or lung: Secondary | ICD-10-CM | POA: Diagnosis not present

## 2022-12-24 LAB — CBC WITH DIFFERENTIAL (CANCER CENTER ONLY)
Abs Immature Granulocytes: 0.01 10*3/uL (ref 0.00–0.07)
Basophils Absolute: 0 10*3/uL (ref 0.0–0.1)
Basophils Relative: 0 %
Eosinophils Absolute: 0.2 10*3/uL (ref 0.0–0.5)
Eosinophils Relative: 3 %
HCT: 36.7 % (ref 36.0–46.0)
Hemoglobin: 12.5 g/dL (ref 12.0–15.0)
Immature Granulocytes: 0 %
Lymphocytes Relative: 20 %
Lymphs Abs: 1.1 10*3/uL (ref 0.7–4.0)
MCH: 32.8 pg (ref 26.0–34.0)
MCHC: 34.1 g/dL (ref 30.0–36.0)
MCV: 96.3 fL (ref 80.0–100.0)
Monocytes Absolute: 0.4 10*3/uL (ref 0.1–1.0)
Monocytes Relative: 8 %
Neutro Abs: 3.5 10*3/uL (ref 1.7–7.7)
Neutrophils Relative %: 69 %
Platelet Count: 127 10*3/uL — ABNORMAL LOW (ref 150–400)
RBC: 3.81 MIL/uL — ABNORMAL LOW (ref 3.87–5.11)
RDW: 12.7 % (ref 11.5–15.5)
WBC Count: 5.2 10*3/uL (ref 4.0–10.5)
nRBC: 0 % (ref 0.0–0.2)

## 2022-12-24 LAB — CMP (CANCER CENTER ONLY)
ALT: 12 U/L (ref 0–44)
AST: 14 U/L — ABNORMAL LOW (ref 15–41)
Albumin: 4.4 g/dL (ref 3.5–5.0)
Alkaline Phosphatase: 79 U/L (ref 38–126)
Anion gap: 6 (ref 5–15)
BUN: 13 mg/dL (ref 8–23)
CO2: 28 mmol/L (ref 22–32)
Calcium: 9.6 mg/dL (ref 8.9–10.3)
Chloride: 105 mmol/L (ref 98–111)
Creatinine: 1.24 mg/dL — ABNORMAL HIGH (ref 0.44–1.00)
GFR, Estimated: 47 mL/min — ABNORMAL LOW (ref >60)
Glucose, Bld: 103 mg/dL — ABNORMAL HIGH (ref 70–99)
Potassium: 4.1 mmol/L (ref 3.5–5.1)
Sodium: 139 mmol/L (ref 135–145)
Total Bilirubin: 0.5 mg/dL (ref <1.2)
Total Protein: 6.8 g/dL (ref 6.5–8.1)

## 2022-12-24 MED ORDER — IOHEXOL 300 MG/ML  SOLN
75.0000 mL | Freq: Once | INTRAMUSCULAR | Status: AC | PRN
  Administered 2022-12-24: 75 mL via INTRAVENOUS

## 2022-12-26 DIAGNOSIS — K080 Exfoliation of teeth due to systemic causes: Secondary | ICD-10-CM | POA: Diagnosis not present

## 2022-12-31 ENCOUNTER — Inpatient Hospital Stay: Payer: Medicare Other | Admitting: Internal Medicine

## 2022-12-31 VITALS — BP 110/66 | HR 86 | Temp 98.1°F | Resp 16 | Ht 65.0 in | Wt 164.5 lb

## 2022-12-31 DIAGNOSIS — Z9221 Personal history of antineoplastic chemotherapy: Secondary | ICD-10-CM | POA: Diagnosis not present

## 2022-12-31 DIAGNOSIS — J90 Pleural effusion, not elsewhere classified: Secondary | ICD-10-CM | POA: Diagnosis not present

## 2022-12-31 DIAGNOSIS — C3492 Malignant neoplasm of unspecified part of left bronchus or lung: Secondary | ICD-10-CM | POA: Diagnosis not present

## 2022-12-31 DIAGNOSIS — Z923 Personal history of irradiation: Secondary | ICD-10-CM | POA: Diagnosis not present

## 2022-12-31 DIAGNOSIS — C3412 Malignant neoplasm of upper lobe, left bronchus or lung: Secondary | ICD-10-CM | POA: Diagnosis not present

## 2022-12-31 DIAGNOSIS — R590 Localized enlarged lymph nodes: Secondary | ICD-10-CM | POA: Diagnosis not present

## 2022-12-31 NOTE — Progress Notes (Signed)
[facility] [phone]   [phone]  OFFICE PROGRESS NOTE  Assunta Found, MD [address] [place] [address]  DIAGNOSIS: Stage IIIc (T3, N3, M0) non-small cell lung cancer, adenocarcinoma presented with large left upper lobe lung mass in addition to left infrahilar and right hilar and subcarinal lymphadenopathy diagnosed in August 2021.   Molecular Biomarkers: Insufficient tissue for foundation 1. Guardant 360 results.  DETECTED ALTERATION(S) / BIOMARKER(S) % CFDNA OR AMPLIFICATION ASSOCIATED FDA-APPROVED THERAPIES CLINICAL TRIAL AVAILABILITY  EGFR L858R, 1.2%, Afatinib, Dacomitinib, Erlotinib, Gefitinib, Osimertinib, Ramucirumab Yes [id number], 1.0%, Afatinib, Dacomitinib, Erlotinib, Gefitinib, Osimertinib, Ramucirumab Yes [id number] 1.0% None  No   PRIOR THERAPY: Weekly concurrent chemoradiation with carboplatin for an AUC of 2 and paclitaxel 45 mg/m2.  First dose expected on 11/16/2019. Status post 7 cycles.   CURRENT THERAPY: Tagrisso 80 mg p.o. daily. First dose started 01/31/2020.  Status post 34 months of treatment.  INTERVAL HISTORY: Linda Woods 72 y.o. female returns to clinic today for follow-up visit accompanied by her husband.Discussed the use of AI scribe software for clinical note transcription with the patient, who gave verbal consent to proceed.  History of Present Illness   Linda Woods, a 72 year old patient with a history of stage 3C adenocarcinoma with positive EGFR mutation, was diagnosed in August 2021. She underwent a course of chemotherapy and radiation, showing a good response, and started on Tagrisso in December 2021. The patient has been on this treatment for 34 months. Two months prior to this consultation, nodules were found in her lung, prompting a repeat scan in two months.  The patient reports feeling generally well, but notes occasional coughing and fatigue. The coughing is not consistent and is  not associated with hemoptysis or sputum production. She also reports a slight increase in shortness of breath, particularly when walking uphill. The patient denies experiencing nausea or vomiting.  The patient continues to experience diarrhea once a week, a known side effect of her Tagrisso treatment. She manages this with loperamide, which she reports as effective. She denies any recent weight loss.  The patient also reports experiencing chest pain, which is brief but painful. She is unsure if it is related to indigestion, although she does take medication for this. The pain sometimes occurs when she has not eaten and is often triggered by wearing a bra. She also notes residual soreness from previous radiation treatment. She has an upcoming appointment with a cardiologist to investigate this further.  The patient's primary care doctor has recently changed due to her previous doctor's retirement. She is now under the care of Dr. Audrea Muscat.       MEDICAL HISTORY: Past Medical History:  Diagnosis Date   Anemia    as a child   Anxiety    Asthma 10/19/2020   Bursitis    Chronic reflux esophagitis    Complication of anesthesia    Diarrhea, functional    Diverticulosis    GERD (gastroesophageal reflux disease)    History of kidney stones    Hypertension    Knee pain    right knee-seeing ortho   lung ca 09/2019   Osteoarthritis    Plantar fasciitis    PONV (postoperative nausea and vomiting)    has used the patch before and that helps   Pure hypercholesterolemia    RLS (restless legs syndrome)    Superficial vein thrombosis    Thyroid disease    hypothyroid    ALLERGIES:  has No Known Allergies.  MEDICATIONS:  Current Outpatient Medications  Medication Sig Dispense Refill   albuterol (VENTOLIN HFA) 108 (90 Base) MCG/ACT inhaler Inhale 2 puffs into the lungs every 6 (six) hours as needed for wheezing or shortness of breath. 8.5 g 0   cholecalciferol (VITAMIN D3) 25 MCG (1000  UNIT) tablet Take 1,000 Units by mouth daily. Pt states she take 2000 units/day     cyclobenzaprine (FLEXERIL) 10 MG tablet Take 10 mg by mouth 3 (three) times daily as needed for muscle spasms.     famotidine (PEPCID) 40 MG tablet Take 40 mg by mouth See admin instructions. 4 times a week     ferrous sulfate 325 (65 FE) MG tablet Take 325 mg by mouth daily with breakfast.     fexofenadine (ALLEGRA) 180 MG tablet Take 180 mg by mouth daily.     fluticasone (FLONASE) 50 MCG/ACT nasal spray Place 2 sprays into both nostrils as needed for allergies.     HYDROcodone-homatropine (HYCODAN) 5-1.5 MG/5ML syrup Take 5 mLs by mouth daily as needed for cough.     levothyroxine (SYNTHROID) 100 MCG tablet Take 1 tablet (100 mcg total) by mouth daily. One po qd 90 tablet 0   lidocaine (LIDODERM) 5 % 1 patch daily.     loperamide (IMODIUM) 2 MG capsule Take 2-4 mg by mouth every 6 (six) hours as needed.     LORazepam (ATIVAN) 0.5 MG tablet Take 0.5 mg by mouth at bedtime.      Magnesium-Potassium 250-100 MG TABS Take 1 tablet by mouth daily.     melatonin 5 MG TABS Take 5 mg by mouth at bedtime.     metoprolol succinate (TOPROL-XL) 50 MG 24 hr tablet Take 50 mg by mouth daily.      MYRBETRIQ 50 MG TB24 tablet Take 50 mg by mouth daily.     NEOMYCIN-POLYMYXIN-HYDROCORTISONE (CORTISPORIN) 1 % SOLN OTIC solution Apply 1-2 drops to toe BID after soaking 10 mL 1   osimertinib mesylate (TAGRISSO) 80 MG tablet TAKE 1 TABLET BY MOUTH DAILY. 30 tablet 2   oxymetazoline (AFRIN) 0.05 % nasal spray Place 1 spray into both nostrils daily as needed for congestion.     PREMARIN vaginal cream Place vaginally.     RABEprazole (ACIPHEX) 20 MG tablet Take 1 tablet (20 mg total) by mouth 2 (two) times a week.     rosuvastatin (CRESTOR) 5 MG tablet Take 5 mg by mouth daily.     triamcinolone cream (KENALOG) 0.1 % Apply 1 Application topically 2 (two) times daily. As needed for itching/rash 453.6 g 0   vitamin B-12  (CYANOCOBALAMIN) 1000 MCG tablet Take 1,000 mcg by mouth daily.     No current facility-administered medications for this visit.    SURGICAL HISTORY:  Past Surgical History:  Procedure Laterality Date   APPENDECTOMY     BRONCHIAL BIOPSY  10/20/2019   Procedure: BRONCHIAL BIOPSIES;  Surgeon: Leslye Peer, MD;  Location: [facility] ENDOSCOPY;  Service: Pulmonary;;   BRONCHIAL BRUSHINGS  10/20/2019   Procedure: BRONCHIAL BRUSHINGS;  Surgeon: Leslye Peer, MD;  Location: [facility] ENDOSCOPY;  Service: Pulmonary;;   BRONCHIAL NEEDLE ASPIRATION BIOPSY  10/20/2019   Procedure: BRONCHIAL NEEDLE ASPIRATION BIOPSIES;  Surgeon: Leslye Peer, MD;  Location: [facility] ENDOSCOPY;  Service: Pulmonary;;   BRONCHIAL WASHINGS  10/20/2019   Procedure: BRONCHIAL WASHINGS;  Surgeon: Leslye Peer, MD;  Location: [facility] ENDOSCOPY;  Service: Pulmonary;;   CARPAL TUNNEL RELEASE Right 2011  Dr. Amanda Pea   COLPORRHAPHY     posterior   HAND SURGERY Right 2016   Nerve surgery, Dr. Amanda Pea   OVARIAN CYST REMOVAL     RECTOCELE REPAIR  2011   w/TVH and sling   TONSILLECTOMY     TONSILLECTOMY     TOTAL KNEE ARTHROPLASTY Left 09/09/2018   Procedure: TOTAL KNEE ARTHROPLASTY, CORTISONE INJECTION RIGHT KNEE;  Surgeon: Durene Romans, MD;  Location: [facility] ORS;  Service: Orthopedics;  Laterality: Left;  70 mins   TOTAL VAGINAL HYSTERECTOMY  10/18/2009   rectocele repair, sling   TUBAL LIGATION Bilateral    VARICOSE VEIN SURGERY     VIDEO BRONCHOSCOPY WITH ENDOBRONCHIAL NAVIGATION N/A 10/20/2019   Procedure: VIDEO BRONCHOSCOPY WITH ENDOBRONCHIAL NAVIGATION;  Surgeon: Leslye Peer, MD;  Location: [facility] ENDOSCOPY;  Service: Pulmonary;  Laterality: N/A;    REVIEW OF SYSTEMS:  Constitutional: positive for fatigue Eyes: negative Ears, nose, mouth, throat, and face: negative Respiratory: positive for dyspnea on exertion and pleurisy/chest pain Cardiovascular: negative Gastrointestinal: negative Genitourinary:negative Integument/breast:  negative Hematologic/lymphatic: negative Musculoskeletal:negative Neurological: negative Behavioral/Psych: negative Endocrine: negative Allergic/Immunologic: negative   PHYSICAL EXAMINATION: General appearance: alert, cooperative, fatigued, and no distress Head: Normocephalic, without obvious abnormality, atraumatic Neck: no adenopathy, no JVD, supple, symmetrical, trachea midline, and thyroid not enlarged, symmetric, no tenderness/mass/nodules Lymph nodes: Cervical, supraclavicular, and axillary nodes normal. Resp: clear to auscultation bilaterally Back: symmetric, no curvature. ROM normal. No CVA tenderness. Cardio: regular rate and rhythm, S1, S2 normal, no murmur, click, rub or gallop GI: soft, non-tender; bowel sounds normal; no masses,  no organomegaly Extremities: extremities normal, atraumatic, no cyanosis or edema Neurologic: Alert and oriented X 3, normal strength and tone. Normal symmetric reflexes. Normal coordination and gait  ECOG PERFORMANCE STATUS: 1 - Symptomatic but completely ambulatory  Blood pressure 110/66, pulse 86, temperature 98.1 F (36.7 C), temperature source Temporal, resp. rate 16, height 5\' 5"  (1.651 m), weight 164 lb 8 oz (74.6 kg), last menstrual period 08/21/2002, SpO2 98%.  LABORATORY DATA: Lab Results  Component Value Date   WBC 5.2 12/24/2022   HGB 12.5 12/24/2022   HCT 36.7 12/24/2022   MCV 96.3 12/24/2022   PLT 127 (L) 12/24/2022      Chemistry      Component Value Date/Time   NA 139 12/24/2022 1443   K 4.1 12/24/2022 1443   CL 105 12/24/2022 1443   CO2 28 12/24/2022 1443   BUN 13 12/24/2022 1443   CREATININE 1.24 (H) 12/24/2022 1443      Component Value Date/Time   CALCIUM 9.6 12/24/2022 1443   ALKPHOS 79 12/24/2022 1443   AST 14 (L) 12/24/2022 1443   ALT 12 12/24/2022 1443   BILITOT 0.5 12/24/2022 1443       RADIOGRAPHIC STUDIES: CT Chest W Contrast  Result Date: 12/31/2022 CLINICAL DATA:  Non-small-cell lung cancer  staging. * Tracking Code: BO * EXAM: CT CHEST WITH CONTRAST TECHNIQUE: Multidetector CT imaging of the chest was performed during intravenous contrast administration. RADIATION DOSE REDUCTION: This exam was performed according to the departmental dose-optimization program which includes automated exposure control, adjustment of the mA and/or kV according to patient size and/or use of iterative reconstruction technique. CONTRAST:  75mL OMNIPAQUE IOHEXOL 300 MG/ML  SOLN COMPARISON:  CT 10/24/2022.  Older exams as well FINDINGS: Cardiovascular: Coronary artery calcifications are seen. Heart is nonenlarged. The thoracic aorta has a normal course and caliber. Mild atherosclerotic calcified plaque. Mediastinum/Nodes: No specific abnormal lymph node enlargement identified in the axillary  regions, hilum or mediastinum. Moderate hiatal hernia. Small thyroid gland. There is some fluid in the pericardial recesses and small amount of fluid to the right of the hiatal hernia, unchanged from previous. Lungs/Pleura: Once again there is bandlike changes along the palate regions bilaterally consistent with likely scarring and fibrotic changes. Areas of distortion and bronchiectasis are stable. Consolidation pneumothorax. Previously there were several small lung nodules which have developed. Example right midlung nodule measured 5 mm. Today this has in the other nodules are improved. The 5 mm nodule just mentioned is no longer clearly visible today. The adjacent 5 mm nodule just posteroinferior to this on the prior today has tiny focus on series 5, image 72 measuring 1-2 mm. Other areas have improved as well. No new dominant nodule. Upper Abdomen: Left adrenal gland is preserved. Stable 13 mm right-sided adrenal gland nodule. Stable upper pole right-sided benign-appearing renal cysts. Musculoskeletal: Scattered degenerative changes of the spine. Osteopenia. Stable sclerotic focus in the T11 vertebral body and a smaller focus at T5 and  T8. IMPRESSION: Previous multiple small lung nodules are significantly improved. No new mass lesion, lymph node enlargement. Stable right adrenal nodule. Stable posttreatment scarring and fibrotic changes perihilar. Hiatal hernia Aortic Atherosclerosis (ICD10-I70.0). Electronically Signed   By: Karen Kays M.D.   On: 12/31/2022 12:02    ASSESSMENT AND PLAN: This is a very pleasant 72 years old white female recently diagnosed with a stage IIIc/IV (T3, N3, M0/M1a) non-small cell lung cancer, adenocarcinoma presented with large left upper lobe lung mass in addition to left infrahilar, right hilar and subcarinal lymphadenopathy and small left pleural effusion diagnosed in August 2021 with positive EGFR mutation in exon 21 (L858R). The patient completed a course of concurrent chemoradiation with weekly carboplatin and paclitaxel status post 7 cycles.  She tolerated her treatment well except for fatigue as well as a skin burn and cough. Her scan showed mild improvement of the left upper lobe lung mass as well as the mediastinal lymphadenopathy.  The patient continues to have small left pleural effusion that is suspicious given her presentation. She is currently on treatment with Tagrisso 80 mg p.o. daily started on January 31, 2020.  She is status post 34 months of treatment.  She had repeat CT scan of the chest performed recently.  I personally and independently reviewed the scan images and discussed the result with the patient today.  Her scan showed nephric and improvement on the previously seen small bilateral pulmonary nodules with no new findings.    Stage III C Adenocarcinoma with EGFR Mutation Diagnosed in August 2021. Underwent chemotherapy and radiation with good response. Currently on Tagrisso for 34 months. Recent scan shows some nodules disappearing, final report pending. Reports intermittent coughing, mild dyspnea, and weekly diarrhea managed with Imodium. No recent weight loss, BP well-managed,  and blood work normal. Discussed continuing Tagrisso unless scan indicates otherwise. Planning trips to [place] in July and a cruise in September. - Continue Tagrisso - Review final scan report - Follow-up in two months with lab work - Reach out if scan report shows concerning findings  Chest Pain Intermittent, severe chest pain, sometimes triggered by wearing a bra. Possible causes include indigestion or residual soreness from radiation. Upcoming cardiology appointment. - Monitor scan for any findings related to chest pain - Follow-up with cardiologist next week  General Health Maintenance - Update primary care provider to Dr. Audrea Muscat.   She was advised to call immediately if she has any concerning symptoms in  the interval.    The patient voices understanding of current disease status and treatment options and is in agreement with the current care plan.  All questions were answered. The patient knows to call the clinic with any problems, questions or concerns. We can certainly see the patient much sooner if necessary.  Disclaimer: This note was dictated with voice recognition software. Similar sounding words can inadvertently be transcribed and may not be corrected upon review.

## 2023-01-02 ENCOUNTER — Encounter: Payer: Self-pay | Admitting: Student

## 2023-01-02 ENCOUNTER — Ambulatory Visit: Payer: Medicare Other | Attending: Student | Admitting: Student

## 2023-01-02 VITALS — BP 104/68 | HR 102 | Ht 65.0 in | Wt 165.0 lb

## 2023-01-02 DIAGNOSIS — C3492 Malignant neoplasm of unspecified part of left bronchus or lung: Secondary | ICD-10-CM | POA: Diagnosis not present

## 2023-01-02 DIAGNOSIS — Z01810 Encounter for preprocedural cardiovascular examination: Secondary | ICD-10-CM | POA: Diagnosis not present

## 2023-01-02 DIAGNOSIS — E785 Hyperlipidemia, unspecified: Secondary | ICD-10-CM

## 2023-01-02 DIAGNOSIS — I2510 Atherosclerotic heart disease of native coronary artery without angina pectoris: Secondary | ICD-10-CM | POA: Diagnosis not present

## 2023-01-02 MED ORDER — METOPROLOL SUCCINATE ER 50 MG PO TB24: 50.0000 mg | ORAL_TABLET | Freq: Every day | ORAL | 3 refills | Status: DC

## 2023-01-02 MED ORDER — ROSUVASTATIN CALCIUM 5 MG PO TABS: 5.0000 mg | ORAL_TABLET | Freq: Every day | ORAL | 3 refills | Status: AC

## 2023-01-02 NOTE — Progress Notes (Signed)
Cardiology Office Note    Date:  01/02/2023  ID:  Linda Woods, DOB [birth year], MRN [id number] Cardiologist: Previously Lance Muss, MD    History of Present Illness:    Linda Woods is a 72 y.o. female with past medical history of coronary calcification by CT, HLD, hypothyroidism and non-small cell lung cancer (s/p chemoradiation and currently on Tagrisso) who presents to the office today for annual follow-up.  She was last examined by Dr. Eldridge Dace in 12/2021 and reported some shortness of breath with leaning over and fatigue but denied any associated chest pain or palpitations. Was still using an elliptical without symptoms. Continued aggressive secondary prevention was recommended for coronary artery calcification and she was continued on her current cardiac medications with Crestor 5 mg daily.  In talking with the patient today, she reports having intermittent dyspnea in the setting of lung cancer but this has overall been stable. No specific orthopnea, PND or pitting edema. She denies any recent exertional chest pain. Does report having occasional episodes of chest discomfort at night which she feels is due to acid reflux as a prior episode improved with vomiting. She does have chronic knee pain and might be undergoing knee replacement in the future. She has not yet met with her orthopedic surgeon to discuss this.  Studies Reviewed:   EKG: EKG is ordered today and demonstrates:   EKG Interpretation Date/Time:  Wednesday January 02 2023 13:02:32 EST Ventricular Rate:  102 PR Interval:  156 QRS Duration:  74 QT Interval:  354 QTC Calculation: 461 R Axis:   67  Text Interpretation: Sinus tachycardia No acute ST changes when compared to prior tracings. Confirmed by Randall An ([id number]) on 01/02/2023 1:04:38 PM       Echocardiogram: 06/2021 IMPRESSIONS     1. Left ventricular ejection fraction, by estimation, is 55 to 60%. The  left ventricle has  normal function. The left ventricle has no regional  wall motion abnormalities. Left ventricular diastolic parameters are  consistent with Grade I diastolic  dysfunction (impaired relaxation). The average left ventricular global  longitudinal strain is -19.2 %.   2. Right ventricular systolic function is normal. The right ventricular  size is normal. There is normal pulmonary artery systolic pressure. The  estimated right ventricular systolic pressure is 31.7 mmHg.   3. The mitral valve is normal in structure. Moderate  thickening/calcification of leaflets. No evidence of mitral valve  regurgitation. No evidence of mitral stenosis.   4. The aortic valve is tricuspid. Aortic valve regurgitation is trivial.  No aortic stenosis is present.   5. The inferior vena cava is normal in size with greater than 50%  respiratory variability, suggesting right atrial pressure of 3 mmHg.    Physical Exam:   VS:  BP 104/68   Pulse (!) 102   Ht 5\' 5"  (1.651 m)   Wt 165 lb (74.8 kg)   LMP 08/21/2002   SpO2 98%   BMI 27.46 kg/m    Wt Readings from Last 3 Encounters:  01/02/23 165 lb (74.8 kg)  12/31/22 164 lb 8 oz (74.6 kg)  11/01/22 164 lb (74.4 kg)     GEN: Well nourished, well developed female appearing in no acute distress NECK: No JVD; No carotid bruits CARDIAC: RRR, no murmurs, rubs, gallops RESPIRATORY:  Clear to auscultation without rales, wheezing or rhonchi  ABDOMEN: Appears non-distended. No obvious abdominal masses. EXTREMITIES: No clubbing or cyanosis. No pitting edema.  Distal pedal pulses are 2+ bilaterally.  Assessment and Plan:   1. Chest Pain/Coronary Calcification by CT - She has been noted to have coronary artery calcifications by prior Chest CT imaging.  She has baseline dyspnea on exertion but denies any exertional chest pain. Her recent episodes of pain have atypical qualities and likely due to a GI etiology as they occur at night and have improved with antacids or  vomiting. - Continue statin therapy with Crestor 5 mg daily and Toprol-XL 50 mg daily.  2. HLD - FLP in 10/2022 showed total cholesterol 209, triglycerides 282, HDL 57 and LDL 104. She reports she had been off Crestor around the time labs were obtained and has resumed this. She is scheduled for follow-up labs in the next few months. Remains on Crestor 5 mg daily as she was previously intolerant to higher intensity statin therapy.  3. Stage III Adenocarcinoma - Followed by Oncology. Previously underwent chemoradiation and currently on Tagrisso.   4. Preoperative Cardiac Clearance for Knee Replacement - She reports she might be undergoing knee replacement in the future but has not yet met with her orthopedic surgeon to discuss this. Given that she does have baseline dyspnea on exertion in the setting of lung cancer and has never undergone workup for coronary calcification by CT imaging, would recommend either a Lexiscan Myoview or Coronary CT for further assessment if she pursues surgery in the interim.   Signed, Ellsworth Lennox, PA-C

## 2023-01-02 NOTE — Patient Instructions (Signed)
Medication Instructions:   Continue current medication regimen.   *If you need a refill on your cardiac medications before your next appointment, please call your pharmacy*   Follow-Up: At [facility], you and your health needs are our priority.  As part of our continuing mission to provide you with exceptional heart care, we have created designated Provider Care Teams.  These Care Teams include your primary Cardiologist (physician) and Advanced Practice Providers (APPs -  Physician Assistants and Nurse Practitioners) who all work together to provide you with the care you need, when you need it.  We recommend signing up for the patient portal called "MyChart".  Sign up information is provided on this After Visit Summary.  MyChart is used to connect with patients for Virtual Visits (Telemedicine).  Patients are able to view lab/test results, encounter notes, upcoming appointments, etc.  Non-urgent messages can be sent to your provider as well.   To learn more about what you can do with MyChart, go to [email or web address].    Your next appointment:   1 year(s)  Provider:   You may see an MD or one of the following Advanced Practice Providers on your designated Care Team:   [place], PA-C  Jacolyn Reedy, [place]

## 2023-01-03 ENCOUNTER — Ambulatory Visit: Payer: Medicare Other | Admitting: Student

## 2023-01-07 ENCOUNTER — Encounter: Payer: Self-pay | Admitting: Internal Medicine

## 2023-01-08 ENCOUNTER — Other Ambulatory Visit: Payer: Self-pay

## 2023-01-09 ENCOUNTER — Encounter: Payer: Self-pay | Admitting: Internal Medicine

## 2023-01-09 NOTE — Telephone Encounter (Signed)
Telephone call  

## 2023-01-10 ENCOUNTER — Other Ambulatory Visit: Payer: Self-pay

## 2023-01-10 ENCOUNTER — Other Ambulatory Visit: Payer: Self-pay | Admitting: Internal Medicine

## 2023-01-10 ENCOUNTER — Other Ambulatory Visit (HOSPITAL_COMMUNITY): Payer: Self-pay

## 2023-01-10 DIAGNOSIS — C3492 Malignant neoplasm of unspecified part of left bronchus or lung: Secondary | ICD-10-CM

## 2023-01-10 MED ORDER — OSIMERTINIB MESYLATE 80 MG PO TABS
ORAL_TABLET | Freq: Every day | ORAL | 2 refills | Status: DC
  Filled 2023-01-10: qty 30, 30d supply, fill #0
  Filled 2023-02-08: qty 30, 30d supply, fill #1
  Filled 2023-02-28 – 2023-03-11 (×2): qty 30, 30d supply, fill #2

## 2023-01-10 NOTE — Progress Notes (Signed)
Specialty Pharmacy Refill Coordination Note  Linda Woods is a 72 y.o. female contacted today regarding refills of specialty medication(s) Osimertinib Mesylate   Patient requested Delivery   Delivery date: 01/15/23   Verified address: [address]   [place] [address]   Medication will be filled on 01/14/23 pending a refill request.

## 2023-01-10 NOTE — Telephone Encounter (Signed)
Tagrisso 80 mg daily

## 2023-01-21 ENCOUNTER — Ambulatory Visit
Admission: RE | Admit: 2023-01-21 | Discharge: 2023-01-21 | Disposition: A | Discharge status: Home / Self Care | Payer: Medicare Other | Source: Ambulatory Visit | Attending: Nurse Practitioner | Admitting: Nurse Practitioner

## 2023-01-21 ENCOUNTER — Ambulatory Visit: Payer: Medicare Other

## 2023-01-21 ENCOUNTER — Other Ambulatory Visit: Payer: Self-pay

## 2023-01-21 VITALS — BP 112/70 | HR 112 | Temp 99.0°F | Resp 17

## 2023-01-21 DIAGNOSIS — R918 Other nonspecific abnormal finding of lung field: Secondary | ICD-10-CM | POA: Diagnosis not present

## 2023-01-21 DIAGNOSIS — Z85118 Personal history of other malignant neoplasm of bronchus and lung: Secondary | ICD-10-CM | POA: Diagnosis not present

## 2023-01-21 DIAGNOSIS — J22 Unspecified acute lower respiratory infection: Secondary | ICD-10-CM

## 2023-01-21 DIAGNOSIS — R062 Wheezing: Secondary | ICD-10-CM

## 2023-01-21 DIAGNOSIS — R059 Cough, unspecified: Secondary | ICD-10-CM | POA: Diagnosis not present

## 2023-01-21 DIAGNOSIS — J8410 Pulmonary fibrosis, unspecified: Secondary | ICD-10-CM | POA: Diagnosis not present

## 2023-01-21 DIAGNOSIS — R000 Tachycardia, unspecified: Secondary | ICD-10-CM

## 2023-01-21 MED ORDER — ALBUTEROL SULFATE HFA 108 (90 BASE) MCG/ACT IN AERS: 2.0000 | INHALATION_SPRAY | Freq: Four times a day (QID) | RESPIRATORY_TRACT | 0 refills | Status: AC | PRN

## 2023-01-21 MED ORDER — AZITHROMYCIN 250 MG PO TABS: ORAL_TABLET | ORAL | 0 refills | Status: DC

## 2023-01-21 MED ORDER — ALBUTEROL SULFATE (2.5 MG/3ML) 0.083% IN NEBU
2.5000 mg | INHALATION_SOLUTION | Freq: Once | RESPIRATORY_TRACT | Status: AC
  Administered 2023-01-21: 2.5 mg via RESPIRATORY_TRACT

## 2023-01-21 MED ORDER — BENZONATATE 100 MG PO CAPS: 100.0000 mg | ORAL_CAPSULE | Freq: Three times a day (TID) | ORAL | 0 refills | Status: DC | PRN

## 2023-01-21 MED ORDER — PREDNISONE 20 MG PO TABS: 40.0000 mg | ORAL_TABLET | Freq: Every day | ORAL | 0 refills | Status: AC

## 2023-01-21 NOTE — ED Provider Notes (Signed)
[facility]    CSN: [id number] Arrival date & time: 01/21/23  1217      History   Chief Complaint Chief Complaint  Patient presents with   Cough    Entered by patient    HPI Linda Woods is a 72 y.o. female.   Patient presents today with 8-day history of congested cough, coughing up green mucus, shortness of breath when she coughs, stuffy nose, sore throat that is now improved, slight headache, decreased appetite, and fatigue.  She denies any known fevers, body aches or chills, chest pain or tightness, runny nose, ear pain, abdominal pain, nausea/vomiting, and diarrhea.  No known sick contacts prior to her symptoms starting.  Has been taking over-the-counter medications without relief.  Patient reports medical history significant for lung cancer, on oral chemotherapy, stable for the past 3 years.  Reports she has used albuterol inhalers in the past when she gets sick that are beneficial, but does not currently have a current inhaler.  Heart rate is noted to be slightly elevated today.  When I asked her if she feels palpitations, she states that she does but denies chest pain.      Past Medical History:  Diagnosis Date   Anemia    as a child   Anxiety    Asthma 10/19/2020   Bursitis    Chronic reflux esophagitis    Complication of anesthesia    Diarrhea, functional    Diverticulosis    GERD (gastroesophageal reflux disease)    History of kidney stones    Hypertension    Knee pain    right knee-seeing ortho   lung ca 09/2019   Osteoarthritis    Plantar fasciitis    PONV (postoperative nausea and vomiting)    has used the patch before and that helps   Pure hypercholesterolemia    RLS (restless legs syndrome)    Superficial vein thrombosis    Thyroid disease    hypothyroid    Patient Active Problem List   Diagnosis Date Noted   Allergic rhinitis 10/19/2020   Asthma 10/19/2020   Cough 08/09/2020   Drug-induced diarrhea 02/22/2020    Adenocarcinoma of left lung, stage 3 (HCC) 11/03/2019   Encounter for antineoplastic chemotherapy 11/03/2019   Goals of care, counseling/discussion 11/03/2019   Pneumothorax, left 10/20/2019   Mass of left lung 10/16/2019   History of adenomatous polyp of colon 09/29/2018   Diverticulosis 09/29/2018   Internal hemorrhoids 09/29/2018   S/P left TKA 09/09/2018   Status post total left knee replacement 09/09/2018   Pain of left heel 07/03/2017   Pain in joint of right hip 05/17/2017   Benign essential hypertension 11/17/2015   Abnormal weight gain 11/17/2015   Obesity, Class I, BMI 30-34.9 11/17/2015   Gastroesophageal reflux disease without esophagitis 10/12/2014   Hypertriglyceridemia 10/12/2014   Hypertensive disorder 05/21/2013   Hypothyroidism 05/21/2013   Vitamin D deficiency 05/21/2013    Past Surgical History:  Procedure Laterality Date   APPENDECTOMY     BRONCHIAL BIOPSY  10/20/2019   Procedure: BRONCHIAL BIOPSIES;  Surgeon: Leslye Peer, MD;  Location: [facility] ENDOSCOPY;  Service: Pulmonary;;   BRONCHIAL BRUSHINGS  10/20/2019   Procedure: BRONCHIAL BRUSHINGS;  Surgeon: Leslye Peer, MD;  Location: [facility] ENDOSCOPY;  Service: Pulmonary;;   BRONCHIAL NEEDLE ASPIRATION BIOPSY  10/20/2019   Procedure: BRONCHIAL NEEDLE ASPIRATION BIOPSIES;  Surgeon: Leslye Peer, MD;  Location[facility] ENDOSCOPY;  Service: Pulmonary;;   BRONCHIAL WASHINGS  10/20/2019  Procedure: BRONCHIAL WASHINGS;  Surgeon: Leslye Peer, MD;  Location: [facility] ENDOSCOPY;  Service: Pulmonary;;   CARPAL TUNNEL RELEASE Right 2011   Dr. Amanda Pea   COLPORRHAPHY     posterior   HAND SURGERY Right 2016   Nerve surgery, Dr. Amanda Pea   OVARIAN CYST REMOVAL     RECTOCELE REPAIR  2011   w/TVH and sling   TONSILLECTOMY     TONSILLECTOMY     TOTAL KNEE ARTHROPLASTY Left 09/09/2018   Procedure: TOTAL KNEE ARTHROPLASTY, CORTISONE INJECTION RIGHT KNEE;  Surgeon: Durene Romans, MD;  Location: [facility] ORS;  Service: Orthopedics;   Laterality: Left;  70 mins   TOTAL VAGINAL HYSTERECTOMY  10/18/2009   rectocele repair, sling   TUBAL LIGATION Bilateral    VARICOSE VEIN SURGERY     VIDEO BRONCHOSCOPY WITH ENDOBRONCHIAL NAVIGATION N/A 10/20/2019   Procedure: VIDEO BRONCHOSCOPY WITH ENDOBRONCHIAL NAVIGATION;  Surgeon: Leslye Peer, MD;  Location: [facility] ENDOSCOPY;  Service: Pulmonary;  Laterality: N/A;    OB History     Gravida  4   Para  3   Term  3   Preterm      AB  1   Living  3      SAB  1   IAB      Ectopic      Multiple      Live Births  3            Home Medications    Prior to Admission medications   Medication Sig Start Date End Date Taking? Authorizing Provider  azithromycin (ZITHROMAX) 250 MG tablet Take (2) tablets by mouth on day 1, then take (1) tablet by mouth on days 2-5. 01/21/23  Yes Valentino Nose, NP  benzonatate (TESSALON) 100 MG capsule Take 1 capsule (100 mg total) by mouth 3 (three) times daily as needed for cough. Do not take with alcohol or while driving or operating heavy machinery.  May cause drowsiness. 01/21/23  Yes Valentino Nose, NP  predniSONE (DELTASONE) 20 MG tablet Take 2 tablets (40 mg total) by mouth daily with breakfast for 5 days. 01/21/23 01/26/23 Yes Valentino Nose, NP  albuterol (VENTOLIN HFA) 108 (90 Base) MCG/ACT inhaler Inhale 2 puffs into the lungs every 6 (six) hours as needed for wheezing or shortness of breath. 01/21/23   Valentino Nose, NP  cholecalciferol (VITAMIN D3) 25 MCG (1000 UNIT) tablet Take 1,000 Units by mouth daily. Pt states she take 2000 units/day    [provider]  cyclobenzaprine (FLEXERIL) 10 MG tablet Take 10 mg by mouth 3 (three) times daily as needed for muscle spasms. 04/21/21   [provider]  famotidine (PEPCID) 40 MG tablet Take 40 mg by mouth See admin instructions. 4 times a week    [provider]  ferrous sulfate 325 (65 FE) MG tablet Take 325 mg by mouth daily with breakfast.     [provider]  fexofenadine (ALLEGRA) 180 MG tablet Take 180 mg by mouth daily.    [provider]  fluticasone (FLONASE) 50 MCG/ACT nasal spray Place 2 sprays into both nostrils as needed for allergies. 10/01/19   [provider]  HYDROcodone-homatropine (HYCODAN) 5-1.5 MG/5ML syrup Take 5 mLs by mouth daily as needed for cough. 07/08/19   [provider]  levothyroxine (SYNTHROID) 100 MCG tablet Take 1 tablet (100 mcg total) by mouth daily. One po qd 11/13/22   Jerene Bears, MD  lidocaine (LIDODERM) 5 % 1  patch daily. 04/26/20   [provider]  loperamide (IMODIUM) 2 MG capsule Take 2-4 mg by mouth every 6 (six) hours as needed. 12/24/20   [provider]  LORazepam (ATIVAN) 0.5 MG tablet Take 0.5 mg by mouth at bedtime.     [provider]  Magnesium-Potassium 250-100 MG TABS Take 1 tablet by mouth daily.    [provider]  melatonin 5 MG TABS Take 5 mg by mouth at bedtime.    [provider]  metoprolol succinate (TOPROL-XL) 50 MG 24 hr tablet Take 1 tablet (50 mg total) by mouth daily. 01/02/23   Strader, Lennart Pall, PA-C  MYRBETRIQ 50 MG TB24 tablet Take 50 mg by mouth daily. 02/08/21   [provider]  NEOMYCIN-POLYMYXIN-HYDROCORTISONE (CORTISPORIN) 1 % SOLN OTIC solution Apply 1-2 drops to toe BID after soaking 05/01/22   Hyatt, Max T, DPM  osimertinib mesylate (TAGRISSO) 80 MG tablet TAKE 1 TABLET BY MOUTH DAILY. 01/10/23 01/10/24  Si Gaul, MD  oxymetazoline (AFRIN) 0.05 % nasal spray Place 1 spray into both nostrils daily as needed for congestion.    [provider]  PREMARIN vaginal cream Place vaginally. 12/19/21   [provider]  RABEprazole (ACIPHEX) 20 MG tablet Take 1 tablet (20 mg total) by mouth 2 (two) times a week. 03/08/16   Jerene Bears, MD  rosuvastatin (CRESTOR) 5 MG tablet Take 1 tablet (5 mg total) by mouth daily. 01/02/23   Strader, Lennart Pall, PA-C   triamcinolone cream (KENALOG) 0.1 % Apply 1 Application topically 2 (two) times daily. As needed for itching/rash 07/20/22   Heilingoetter, Cassandra L, PA-C  vitamin B-12 (CYANOCOBALAMIN) 1000 MCG tablet Take 1,000 mcg by mouth daily.    [provider]    Family History Family History  Problem Relation Age of Onset   Cervical cancer Mother    Heart failure Mother    Heart failure Father    Diabetes Father    Cancer Brother    Breast cancer Maternal Aunt    Breast cancer Paternal Aunt     Social History Social History   Tobacco Use   Smoking status: Never   Smokeless tobacco: Never  Vaping Use   Vaping status: Never Used  Substance Use Topics   Alcohol use: Yes    Alcohol/week: 2.0 - 3.0 standard drinks of alcohol    Types: 2 - 3 Glasses of wine per week   Drug use: No     Allergies   Patient has no known allergies.   Review of Systems Review of Systems Per HPI  Physical Exam Triage Vital Signs ED Triage Vitals  Encounter Vitals Group     BP 01/21/23 1316 112/70     Systolic BP Percentile --      Diastolic BP Percentile --      Pulse Rate 01/21/23 1316 (!) 112     Resp 01/21/23 1316 17     Temp 01/21/23 1316 99 F (37.2 C)     Temp Source 01/21/23 1316 Oral     SpO2 01/21/23 1316 93 %     Weight --      Height --      Head Circumference --      Peak Flow --      Pain Score 01/21/23 1320 0     Pain Loc --      Pain Education --      Exclude from Growth Chart --    No data found.  Updated Vital Signs BP 112/70 (BP Location: Right Arm)   Pulse (!) 112   Temp 99 F (37.2 C) (Oral)   Resp 17   LMP 08/21/2002   SpO2 93%   Visual Acuity Right Eye Distance:   Left Eye Distance:   Bilateral Distance:    Right Eye Near:   Left Eye Near:    Bilateral Near:     Physical Exam Vitals and nursing note reviewed.  Constitutional:      General: She is not in acute distress.    Appearance: Normal appearance. She is not ill-appearing or  toxic-appearing.  HENT:     Head: Normocephalic and atraumatic.     Right Ear: Tympanic membrane, ear canal and external ear normal.     Left Ear: Tympanic membrane, ear canal and external ear normal.     Nose: Rhinorrhea present. No congestion.     Mouth/Throat:     Mouth: Mucous membranes are moist.     Pharynx: Oropharynx is clear. Posterior oropharyngeal erythema present. No oropharyngeal exudate.  Eyes:     General: No scleral icterus.    Extraocular Movements: Extraocular movements intact.  Cardiovascular:     Rate and Rhythm: Regular rhythm. Tachycardia present.  Pulmonary:     Effort: Pulmonary effort is normal. No respiratory distress.     Breath sounds: Wheezing present. No rhonchi or rales.  Musculoskeletal:     Cervical back: Normal range of motion and neck supple.  Lymphadenopathy:     Cervical: No cervical adenopathy.  Skin:    General: Skin is warm and dry.     Coloration: Skin is not jaundiced or pale.     Findings: No erythema or rash.  Neurological:     Mental Status: She is alert and oriented to person, place, and time.  Psychiatric:        Behavior: Behavior is cooperative.      UC Treatments / Results  Labs (all labs ordered are listed, but only abnormal results are displayed) Labs Reviewed - No data to display  EKG   Radiology No results found.  Procedures Procedures (including critical care time)  Medications Ordered in UC Medications  albuterol (PROVENTIL) (2.5 MG/3ML) 0.083% nebulizer solution 2.5 mg (2.5 mg Nebulization Given 01/21/23 1339)    Initial Impression / Assessment and Plan / UC Course  I have reviewed the triage vital signs and the nursing notes.  Pertinent labs & imaging results that were available during my care of the patient were reviewed by me and considered in my medical decision making (see chart for details).   Patient is well-appearing, normotensive, afebrile, not tachypneic, oxygenating well on room air.  Patient  is mildly tachycardic in triage today.  1. Acute lower respiratory infection 2. Wheezing Overall, vitals and exam are stable Albuterol nebulizer given with improvement in subjective symptoms, patient had increased expiratory wheezing, however inspiratory wheezing improved after breathing treatment Chest x-ray pending at time of discharge, will contact patient if chest x-ray shows pneumonia and requires alternate treatment In the meantime, start albuterol inhaler at home every 6 hours around-the-clock for the next 48 hours, then reduce to as needed In addition, start oral azithromycin and prednisone as prescribed to help treat lung inflammation as patient is high risk for pulmonary compromise due to lung cancer Other supportive care discussed including Tessalon Perles Recommended follow-up if symptoms do not improve, strict ER precautions discussed if symptoms worsen  3. Sinus tachycardia EKG today shows sinus tachycardia without any  significant ST or T wave abnormalities when compared with previous EKG Suspect secondary to acute lower respiratory infection which we are treating today  The patient was given the opportunity to ask questions.  All questions answered to their satisfaction.  The patient is in agreement to this plan.    Final Clinical Impressions(s) / UC Diagnoses   Final diagnoses:  Acute lower respiratory infection  Wheezing  Sinus tachycardia     Discharge Instructions      I am concerned you have a lower respiratory infection.  I will contact you later today if the chest x-ray shows pneumonia.  In the meantime, start using the albuterol inhaler every 4-6 hours as needed for wheezing or shortness of breath.  Your last dose of albuterol at urgent care today was at 1:30 PM.  Start taking the oral prednisone to help with lung inflammation, in addition take the azithromycin as prescribed.  Seek care if your symptoms do not improve with treatment.  Some things that can make  you feel better are: - Increased rest - Increasing fluid with water/sugar free electrolytes - Acetaminophen and ibuprofen as needed for fever/pain - Salt water gargling, chloraseptic spray and throat lozenges - OTC guaifenesin (Mucinex) 600 mg twice daily for congestion - Saline sinus flushes or a neti pot - Humidifying the air -Tessalon Perles every 8 hours as needed for dry cough      ED Prescriptions     Medication Sig Dispense Auth. Provider   azithromycin (ZITHROMAX) 250 MG tablet Take (2) tablets by mouth on day 1, then take (1) tablet by mouth on days 2-5. 6 tablet Cathlean Marseilles A, NP   predniSONE (DELTASONE) 20 MG tablet Take 2 tablets (40 mg total) by mouth daily with breakfast for 5 days. 10 tablet Cathlean Marseilles A, NP   albuterol (VENTOLIN HFA) 108 (90 Base) MCG/ACT inhaler Inhale 2 puffs into the lungs every 6 (six) hours as needed for wheezing or shortness of breath. 8.5 g Cathlean Marseilles A, NP   benzonatate (TESSALON) 100 MG capsule Take 1 capsule (100 mg total) by mouth 3 (three) times daily as needed for cough. Do not take with alcohol or while driving or operating heavy machinery.  May cause drowsiness. 21 capsule Valentino Nose, NP      PDMP not reviewed this encounter.   Valentino Nose, NP 01/21/23 [phone]

## 2023-01-21 NOTE — ED Triage Notes (Signed)
Pt reports cough x 1 week, states it started with a sore throat and cough is lingering.

## 2023-01-21 NOTE — Discharge Instructions (Signed)
I am concerned you have a lower respiratory infection.  I will contact you later today if the chest x-ray shows pneumonia.  In the meantime, start using the albuterol inhaler every 4-6 hours as needed for wheezing or shortness of breath.  Your last dose of albuterol at urgent care today was at 1:30 PM.  Start taking the oral prednisone to help with lung inflammation, in addition take the azithromycin as prescribed.  Seek care if your symptoms do not improve with treatment.  Some things that can make you feel better are: - Increased rest - Increasing fluid with water/sugar free electrolytes - Acetaminophen and ibuprofen as needed for fever/pain - Salt water gargling, chloraseptic spray and throat lozenges - OTC guaifenesin (Mucinex) 600 mg twice daily for congestion - Saline sinus flushes or a neti pot - Humidifying the air -Tessalon Perles every 8 hours as needed for dry cough

## 2023-01-30 DIAGNOSIS — M62830 Muscle spasm of back: Secondary | ICD-10-CM | POA: Diagnosis not present

## 2023-01-30 DIAGNOSIS — M9903 Segmental and somatic dysfunction of lumbar region: Secondary | ICD-10-CM | POA: Diagnosis not present

## 2023-01-30 DIAGNOSIS — M25552 Pain in left hip: Secondary | ICD-10-CM | POA: Diagnosis not present

## 2023-01-30 DIAGNOSIS — M9905 Segmental and somatic dysfunction of pelvic region: Secondary | ICD-10-CM | POA: Diagnosis not present

## 2023-02-01 DIAGNOSIS — M62830 Muscle spasm of back: Secondary | ICD-10-CM | POA: Diagnosis not present

## 2023-02-01 DIAGNOSIS — M25552 Pain in left hip: Secondary | ICD-10-CM | POA: Diagnosis not present

## 2023-02-01 DIAGNOSIS — M9903 Segmental and somatic dysfunction of lumbar region: Secondary | ICD-10-CM | POA: Diagnosis not present

## 2023-02-01 DIAGNOSIS — M9905 Segmental and somatic dysfunction of pelvic region: Secondary | ICD-10-CM | POA: Diagnosis not present

## 2023-02-04 ENCOUNTER — Other Ambulatory Visit (HOSPITAL_BASED_OUTPATIENT_CLINIC_OR_DEPARTMENT_OTHER): Payer: Medicare Other

## 2023-02-07 DIAGNOSIS — Z6827 Body mass index (BMI) 27.0-27.9, adult: Secondary | ICD-10-CM | POA: Diagnosis not present

## 2023-02-07 DIAGNOSIS — M545 Low back pain, unspecified: Secondary | ICD-10-CM | POA: Diagnosis not present

## 2023-02-07 DIAGNOSIS — Z0001 Encounter for general adult medical examination with abnormal findings: Secondary | ICD-10-CM | POA: Diagnosis not present

## 2023-02-07 DIAGNOSIS — E663 Overweight: Secondary | ICD-10-CM | POA: Diagnosis not present

## 2023-02-07 DIAGNOSIS — Z1331 Encounter for screening for depression: Secondary | ICD-10-CM | POA: Diagnosis not present

## 2023-02-08 ENCOUNTER — Other Ambulatory Visit: Payer: Self-pay

## 2023-02-08 NOTE — Progress Notes (Signed)
Clinical Intervention Note  Clinical Intervention Notes: Patient has started taking Prednisone. No DDIs identified with Tagrisso.   Clinical Intervention Outcomes: Prevention of an adverse drug event   Otto Herb Specialty Pharmacist

## 2023-02-08 NOTE — Progress Notes (Signed)
Specialty Pharmacy Refill Coordination Note  Linda Woods is a 72 y.o. female contacted today regarding refills of specialty medication(s) Osimertinib Mesylate [name]   Patient requested Delivery   Delivery date: 02/12/23   Verified address: [address]   [place] [address]   Medication will be filled on 02/11/23.

## 2023-02-11 ENCOUNTER — Other Ambulatory Visit: Payer: Self-pay

## 2023-02-11 ENCOUNTER — Other Ambulatory Visit (HOSPITAL_BASED_OUTPATIENT_CLINIC_OR_DEPARTMENT_OTHER): Payer: Medicare Other

## 2023-02-18 ENCOUNTER — Encounter: Payer: Self-pay | Admitting: Internal Medicine

## 2023-02-18 ENCOUNTER — Telehealth: Payer: Self-pay

## 2023-02-18 ENCOUNTER — Telehealth: Payer: Self-pay | Admitting: Internal Medicine

## 2023-02-18 NOTE — Telephone Encounter (Signed)
Patient called in and stated that she has been having back pain for about 2 weeks.  PCP sent her in prednisone taper pack and she is almost finished.  Pt stated that her back pain is somewhat better and uses hydrocodone as needed.   Pt mentioned results of the 9/4 CT scan and stated that there was suspicious areas on her spine and was concerned for the cancer coming back. Informed patient that I would let Dr. Arbutus Ped know and get back to her with any additional recommendations.

## 2023-02-18 NOTE — Telephone Encounter (Signed)
Patient states not able to see appointments on my chart. Advise patient would mail appointment reminder of future- appts. Also aslk patient to log out or refresh web page for updated appts.

## 2023-02-22 ENCOUNTER — Other Ambulatory Visit: Payer: Self-pay | Admitting: Physician Assistant

## 2023-02-22 NOTE — Telephone Encounter (Signed)
 Informed patient of Dr. Jeannett recommendations above and will be in touch with more information.   Called to check in on patients back pain.  Patient stated that she has periods of pain at waist level/lower back when exerting. Patient states that she finished prednisone  yesterday and is taking hydrocodone  as needed.

## 2023-02-23 ENCOUNTER — Other Ambulatory Visit: Payer: Self-pay | Admitting: Internal Medicine

## 2023-02-23 DIAGNOSIS — C3490 Malignant neoplasm of unspecified part of unspecified bronchus or lung: Secondary | ICD-10-CM

## 2023-02-25 ENCOUNTER — Other Ambulatory Visit (HOSPITAL_COMMUNITY): Payer: Self-pay | Admitting: Family Medicine

## 2023-02-25 ENCOUNTER — Ambulatory Visit (HOSPITAL_COMMUNITY)
Admission: RE | Admit: 2023-02-25 | Discharge: 2023-02-25 | Disposition: A | Discharge status: Home / Self Care | Payer: Medicare Other | Source: Ambulatory Visit | Attending: Family Medicine | Admitting: Family Medicine

## 2023-02-25 ENCOUNTER — Encounter: Payer: Self-pay | Admitting: Medical Oncology

## 2023-02-25 ENCOUNTER — Telehealth: Payer: Self-pay | Admitting: Internal Medicine

## 2023-02-25 ENCOUNTER — Encounter: Payer: Self-pay | Admitting: Internal Medicine

## 2023-02-25 DIAGNOSIS — M160 Bilateral primary osteoarthritis of hip: Secondary | ICD-10-CM | POA: Diagnosis not present

## 2023-02-25 DIAGNOSIS — M25552 Pain in left hip: Secondary | ICD-10-CM | POA: Insufficient documentation

## 2023-02-25 DIAGNOSIS — E663 Overweight: Secondary | ICD-10-CM | POA: Diagnosis not present

## 2023-02-25 DIAGNOSIS — Z6828 Body mass index (BMI) 28.0-28.9, adult: Secondary | ICD-10-CM | POA: Diagnosis not present

## 2023-02-26 ENCOUNTER — Other Ambulatory Visit (HOSPITAL_BASED_OUTPATIENT_CLINIC_OR_DEPARTMENT_OTHER): Payer: Self-pay | Admitting: Obstetrics & Gynecology

## 2023-02-26 DIAGNOSIS — E039 Hypothyroidism, unspecified: Secondary | ICD-10-CM

## 2023-02-26 DIAGNOSIS — E785 Hyperlipidemia, unspecified: Secondary | ICD-10-CM

## 2023-02-27 ENCOUNTER — Encounter: Payer: Self-pay | Admitting: Internal Medicine

## 2023-02-28 ENCOUNTER — Other Ambulatory Visit (HOSPITAL_BASED_OUTPATIENT_CLINIC_OR_DEPARTMENT_OTHER): Payer: Medicare Other

## 2023-02-28 ENCOUNTER — Other Ambulatory Visit: Payer: Self-pay

## 2023-02-28 DIAGNOSIS — E785 Hyperlipidemia, unspecified: Secondary | ICD-10-CM

## 2023-02-28 DIAGNOSIS — E039 Hypothyroidism, unspecified: Secondary | ICD-10-CM | POA: Diagnosis not present

## 2023-02-28 DIAGNOSIS — Z96652 Presence of left artificial knee joint: Secondary | ICD-10-CM | POA: Diagnosis not present

## 2023-02-28 DIAGNOSIS — M1711 Unilateral primary osteoarthritis, right knee: Secondary | ICD-10-CM | POA: Diagnosis not present

## 2023-02-28 LAB — TSH: TSH: 3.4 u[IU]/mL (ref 0.450–4.500)

## 2023-02-28 LAB — LIPID PANEL
Chol/HDL Ratio: 2.3 {ratio} (ref 0.0–4.4)
Cholesterol, Total: 162 mg/dL (ref 100–199)
HDL: 69 mg/dL (ref >39)
LDL Chol Calc (NIH): 50 mg/dL (ref 0–99)
Triglycerides: 280 mg/dL — ABNORMAL HIGH (ref 0–149)
VLDL Cholesterol Cal: 43 mg/dL — ABNORMAL HIGH (ref 5–40)

## 2023-02-28 LAB — HEMOGLOBIN A1C
Est. average glucose Bld gHb Est-mCnc: 123 mg/dL
Hgb A1c MFr Bld: 5.9 % — ABNORMAL HIGH (ref 4.8–5.6)

## 2023-02-28 NOTE — Progress Notes (Signed)
 Specialty Pharmacy Ongoing Clinical Assessment Note  Linda Woods is a 73 y.o. female who is being followed by the specialty pharmacy service for RxSp Oncology   Patient's specialty medication(s) reviewed today: Osimertinib  Mesylate (TAGRISSO )   Missed doses in the last 4 weeks: 0   Patient/Caregiver did not have any additional questions or concerns.   Therapeutic benefit summary: Patient is achieving benefit   Adverse events/side effects summary: Experienced adverse events/side effects (Diarrhea successuflly managed by loperamide )   Patient's therapy is appropriate to: Continue    Goals Addressed             This Visit's Progress    Slow Disease Progression       Patient is on track. Patient will maintain adherence. Per provider note from 11/11 recent scans did not show any new findings.          Follow up:  6 months  Odena Mcquaid M Tymarion Everard Specialty Pharmacist

## 2023-02-28 NOTE — Progress Notes (Signed)
 Clinical Intervention Note  Clinical Intervention Notes: Patient will be starting Celebrex  for back pain after having completed Prednisone . No DDIs identified with Tagrisso .   Clinical Intervention Outcomes: Prevention of an adverse drug event   Delon CHRISTELLA Brow Specialty Pharmacist

## 2023-02-28 NOTE — Progress Notes (Signed)
 Specialty Pharmacy Refill Coordination Note  Linda Woods is a 73 y.o. female contacted today regarding refills of specialty medication(s) Osimertinib  Mesylate (TAGRISSO )   Patient requested Delivery   Delivery date: 03/11/23   Verified address: [address]   [place] [id number]   Medication will be filled on 03/08/23.

## 2023-03-01 ENCOUNTER — Encounter (HOSPITAL_COMMUNITY)
Admission: RE | Admit: 2023-03-01 | Discharge: 2023-03-01 | Disposition: A | Discharge status: Home / Self Care | Payer: Medicare Other | Source: Ambulatory Visit | Attending: Internal Medicine | Admitting: Internal Medicine

## 2023-03-01 DIAGNOSIS — I700 Atherosclerosis of aorta: Secondary | ICD-10-CM | POA: Diagnosis not present

## 2023-03-01 DIAGNOSIS — I2510 Atherosclerotic heart disease of native coronary artery without angina pectoris: Secondary | ICD-10-CM | POA: Insufficient documentation

## 2023-03-01 DIAGNOSIS — C7951 Secondary malignant neoplasm of bone: Secondary | ICD-10-CM | POA: Insufficient documentation

## 2023-03-01 DIAGNOSIS — C3490 Malignant neoplasm of unspecified part of unspecified bronchus or lung: Secondary | ICD-10-CM | POA: Insufficient documentation

## 2023-03-01 DIAGNOSIS — K449 Diaphragmatic hernia without obstruction or gangrene: Secondary | ICD-10-CM | POA: Insufficient documentation

## 2023-03-01 LAB — GLUCOSE, CAPILLARY: Glucose-Capillary: 109 mg/dL — ABNORMAL HIGH (ref 70–99)

## 2023-03-01 MED ORDER — FLUDEOXYGLUCOSE F - 18 (FDG) INJECTION
8.2000 | Freq: Once | INTRAVENOUS | Status: AC | PRN
  Administered 2023-03-01: 8.2 via INTRAVENOUS

## 2023-03-04 ENCOUNTER — Encounter (HOSPITAL_BASED_OUTPATIENT_CLINIC_OR_DEPARTMENT_OTHER): Payer: Self-pay | Admitting: Obstetrics & Gynecology

## 2023-03-05 ENCOUNTER — Other Ambulatory Visit: Payer: Medicare Other

## 2023-03-05 ENCOUNTER — Ambulatory Visit: Payer: Medicare Other | Admitting: Internal Medicine

## 2023-03-05 DIAGNOSIS — M545 Low back pain, unspecified: Secondary | ICD-10-CM | POA: Diagnosis not present

## 2023-03-06 ENCOUNTER — Telehealth: Payer: Self-pay | Admitting: Student

## 2023-03-06 DIAGNOSIS — R0609 Other forms of dyspnea: Secondary | ICD-10-CM

## 2023-03-06 DIAGNOSIS — R079 Chest pain, unspecified: Secondary | ICD-10-CM

## 2023-03-06 NOTE — Telephone Encounter (Signed)
   Pre-operative Risk Assessment    Patient Name: Linda Woods  DOB: 01-24-1951 MRN: [id number]   Date of last office visit: 01/02/2023  Date of next office visit: none   Request for Surgical Clearance    Procedure:   right total knee arthroplasty  Date of Surgery:  Clearance 04/16/23                                Surgeon:  Dr. Claiborne Crew Surgeon's Group or Practice Name:  [facility] Phone number:  [phone] Fax number:  [phone]   Type of Clearance Requested:   - Medical    Type of Anesthesia:  Spinal   Additional requests/questions:    SignedLacey Pian   03/06/2023, 12:05 PM

## 2023-03-06 NOTE — Telephone Encounter (Signed)
 Patient wants to get orders to have the test for plaque in her arteries as was discussed with B. Strader.

## 2023-03-07 ENCOUNTER — Encounter: Payer: Self-pay | Admitting: *Deleted

## 2023-03-07 MED ORDER — METOPROLOL TARTRATE 100 MG PO TABS: 100.0000 mg | ORAL_TABLET | ORAL | 0 refills | Status: DC

## 2023-03-07 NOTE — Telephone Encounter (Signed)
Follow Up:      Patient is returning call from today. 

## 2023-03-07 NOTE — Telephone Encounter (Signed)
Called pt with provider with suggestions. No answer. Left msg to call back.

## 2023-03-07 NOTE — Telephone Encounter (Signed)
    Would recommend proceeding with a Coronary CTA as discussed during her office visit. Associations are chest pain, dyspnea on exertion and coronary calcification by CT. She does take Toprol-XL and would hold that and use Lopressor (dosing per protocol) the day of her scan.   Signed, Ellsworth Lennox, PA-C 03/07/2023, 7:55 AM Pager: [phone]

## 2023-03-07 NOTE — Telephone Encounter (Signed)
Returned call to pt and pt notified. Instruction letter mailed to pt.

## 2023-03-08 ENCOUNTER — Other Ambulatory Visit: Payer: Self-pay

## 2023-03-08 ENCOUNTER — Telehealth: Payer: Self-pay

## 2023-03-08 NOTE — Telephone Encounter (Signed)
Faxed surgical clearance form to [phone].  Confirmed fax.

## 2023-03-11 ENCOUNTER — Other Ambulatory Visit: Payer: Self-pay

## 2023-03-12 ENCOUNTER — Other Ambulatory Visit (HOSPITAL_COMMUNITY): Payer: Self-pay

## 2023-03-12 ENCOUNTER — Other Ambulatory Visit: Payer: Self-pay

## 2023-03-13 ENCOUNTER — Inpatient Hospital Stay: Payer: Medicare Other | Admitting: Internal Medicine

## 2023-03-13 ENCOUNTER — Inpatient Hospital Stay: Payer: Medicare Other | Attending: Internal Medicine

## 2023-03-13 VITALS — BP 103/77 | HR 88 | Temp 98.1°F | Resp 17 | Ht 65.0 in | Wt 163.5 lb

## 2023-03-13 DIAGNOSIS — C7951 Secondary malignant neoplasm of bone: Secondary | ICD-10-CM | POA: Diagnosis not present

## 2023-03-13 DIAGNOSIS — C3412 Malignant neoplasm of upper lobe, left bronchus or lung: Secondary | ICD-10-CM | POA: Insufficient documentation

## 2023-03-13 DIAGNOSIS — G8929 Other chronic pain: Secondary | ICD-10-CM | POA: Diagnosis not present

## 2023-03-13 DIAGNOSIS — C3490 Malignant neoplasm of unspecified part of unspecified bronchus or lung: Secondary | ICD-10-CM

## 2023-03-13 DIAGNOSIS — M549 Dorsalgia, unspecified: Secondary | ICD-10-CM | POA: Insufficient documentation

## 2023-03-13 DIAGNOSIS — C3492 Malignant neoplasm of unspecified part of left bronchus or lung: Secondary | ICD-10-CM

## 2023-03-13 LAB — CBC WITH DIFFERENTIAL (CANCER CENTER ONLY)
Abs Immature Granulocytes: 0.03 10*3/uL (ref 0.00–0.07)
Basophils Absolute: 0 10*3/uL (ref 0.0–0.1)
Basophils Relative: 0 %
Eosinophils Absolute: 0.1 10*3/uL (ref 0.0–0.5)
Eosinophils Relative: 2 %
HCT: 36.5 % (ref 36.0–46.0)
Hemoglobin: 12.2 g/dL (ref 12.0–15.0)
Immature Granulocytes: 0 %
Lymphocytes Relative: 16 %
Lymphs Abs: 1.2 10*3/uL (ref 0.7–4.0)
MCH: 32.1 pg (ref 26.0–34.0)
MCHC: 33.4 g/dL (ref 30.0–36.0)
MCV: 96.1 fL (ref 80.0–100.0)
Monocytes Absolute: 0.8 10*3/uL (ref 0.1–1.0)
Monocytes Relative: 10 %
Neutro Abs: 5.4 10*3/uL (ref 1.7–7.7)
Neutrophils Relative %: 72 %
Platelet Count: 171 10*3/uL (ref 150–400)
RBC: 3.8 MIL/uL — ABNORMAL LOW (ref 3.87–5.11)
RDW: 14.1 % (ref 11.5–15.5)
WBC Count: 7.6 10*3/uL (ref 4.0–10.5)
nRBC: 0 % (ref 0.0–0.2)

## 2023-03-13 LAB — CMP (CANCER CENTER ONLY)
ALT: 20 U/L (ref 0–44)
AST: 19 U/L (ref 15–41)
Albumin: 4.2 g/dL (ref 3.5–5.0)
Alkaline Phosphatase: 63 U/L (ref 38–126)
Anion gap: 5 (ref 5–15)
BUN: 14 mg/dL (ref 8–23)
CO2: 28 mmol/L (ref 22–32)
Calcium: 9.5 mg/dL (ref 8.9–10.3)
Chloride: 103 mmol/L (ref 98–111)
Creatinine: 1.06 mg/dL — ABNORMAL HIGH (ref 0.44–1.00)
GFR, Estimated: 56 mL/min — ABNORMAL LOW (ref >60)
Glucose, Bld: 112 mg/dL — ABNORMAL HIGH (ref 70–99)
Potassium: 4 mmol/L (ref 3.5–5.1)
Sodium: 136 mmol/L (ref 135–145)
Total Bilirubin: 0.5 mg/dL (ref 0.0–1.2)
Total Protein: 6.6 g/dL (ref 6.5–8.1)

## 2023-03-13 NOTE — Progress Notes (Signed)
[facility] Telephone:([phone]   [phone]  OFFICE PROGRESS NOTE  Linda Found, MD [address] [place] [address]  DIAGNOSIS: Stage IIIc (T3, N3, M0) non-small cell lung cancer, adenocarcinoma presented with large left upper lobe lung mass in addition to left infrahilar and right hilar and subcarinal lymphadenopathy diagnosed in August 2021.   Molecular Biomarkers: Insufficient tissue for foundation 1. Guardant 360 results.  DETECTED ALTERATION(S) / BIOMARKER(S) % CFDNA OR AMPLIFICATION ASSOCIATED FDA-APPROVED THERAPIES CLINICAL TRIAL AVAILABILITY  EGFR L858R, 1.2%, Afatinib, Dacomitinib, Erlotinib, Gefitinib, Osimertinib, Ramucirumab Yes [id number], 1.0%, Afatinib, Dacomitinib, Erlotinib, Gefitinib, Osimertinib, Ramucirumab Yes [id number] 1.0% None  No   PRIOR THERAPY: Weekly concurrent chemoradiation with carboplatin for an AUC of 2 and paclitaxel 45 mg/m2.  First dose expected on 11/16/2019. Status post 7 cycles.   CURRENT THERAPY: Tagrisso 80 mg p.o. daily. First dose started 01/31/2020.  Status post 34 months of treatment.  INTERVAL HISTORY: Linda Woods 73 y.o. female returns to clinic today for follow-up visit. Discussed the use of AI scribe software for clinical note transcription with the patient, who gave verbal consent to proceed.  History of Present Illness   Linda Woods, a 73 year old patient with a history of stage 3 non-small cell lung cancer (adenocarcinoma), diagnosed in 2021, has been on a treatment regimen of concurrent chemo-radiation and Tagrisso 80mg  daily due to an EGFR mutation in the tumor. The patient has been on this treatment for 37 months.  The patient reports a long-standing issue of back pain, which predates the cancer diagnosis. The pain is described as muscular in nature and tends to flare up and then subside. Recently, the patient completed a course of Prednisone, prescribed by her GP, which provided  some relief. The patient also received an injection, the details of which were not specified, and is planning to start physical therapy for further management of the back pain.  In response to concerns about the back pain, a PET scan was conducted. The scan showed changes in the bones and some activity, but it was below the threshold to definitively diagnose it as cancer.  The patient also reports plans for knee surgery, pending approval, and has appointments scheduled with a pulmonologist and a cardiologist for a calcium test. The patient is also planning a trip to [place] with her granddaughter and daughters in July.  The patient's overall health status appears to be stable on the current treatment regimen, with the primary concern being the chronic back pain.       MEDICAL HISTORY: Past Medical History:  Diagnosis Date   Anemia    as a child   Anxiety    Asthma 10/19/2020   Bursitis    Chronic reflux esophagitis    Complication of anesthesia    Diarrhea, functional    Diverticulosis    GERD (gastroesophageal reflux disease)    History of kidney stones    Hypertension    Knee pain    right knee-seeing ortho   lung ca 09/2019   Osteoarthritis    Plantar fasciitis    PONV (postoperative nausea and vomiting)    has used the patch before and that helps   Pure hypercholesterolemia    RLS (restless legs syndrome)    Superficial vein thrombosis    Thyroid disease    hypothyroid    ALLERGIES:  has no known allergies.  MEDICATIONS:  Current Outpatient Medications  Medication Sig Dispense Refill   albuterol (VENTOLIN HFA) 108 (  90 Base) MCG/ACT inhaler Inhale 2 puffs into the lungs every 6 (six) hours as needed for wheezing or shortness of breath. 8.5 g 0   azithromycin (ZITHROMAX) 250 MG tablet Take (2) tablets by mouth on day 1, then take (1) tablet by mouth on days 2-5. 6 tablet 0   benzonatate (TESSALON) 100 MG capsule Take 1 capsule (100 mg total) by mouth 3 (three) times daily  as needed for cough. Do not take with alcohol or while driving or operating heavy machinery.  May cause drowsiness. 21 capsule 0   cholecalciferol (VITAMIN D3) 25 MCG (1000 UNIT) tablet Take 1,000 Units by mouth daily. Pt states she take 2000 units/day     cyclobenzaprine (FLEXERIL) 10 MG tablet Take 10 mg by mouth 3 (three) times daily as needed for muscle spasms.     famotidine (PEPCID) 40 MG tablet Take 40 mg by mouth See admin instructions. 4 times a week     ferrous sulfate 325 (65 FE) MG tablet Take 325 mg by mouth daily with breakfast.     fexofenadine (ALLEGRA) 180 MG tablet Take 180 mg by mouth daily.     fluticasone (FLONASE) 50 MCG/ACT nasal spray Place 2 sprays into both nostrils as needed for allergies.     HYDROcodone-homatropine (HYCODAN) 5-1.5 MG/5ML syrup Take 5 mLs by mouth daily as needed for cough.     levothyroxine (SYNTHROID) 100 MCG tablet Take 1 tablet (100 mcg total) by mouth daily. One po qd 90 tablet 0   lidocaine (LIDODERM) 5 % 1 patch daily.     loperamide (IMODIUM) 2 MG capsule Take 2-4 mg by mouth every 6 (six) hours as needed.     LORazepam (ATIVAN) 0.5 MG tablet Take 0.5 mg by mouth at bedtime.      Magnesium-Potassium 250-100 MG TABS Take 1 tablet by mouth daily.     melatonin 5 MG TABS Take 5 mg by mouth at bedtime.     metoprolol succinate (TOPROL-XL) 50 MG 24 hr tablet Take 1 tablet (50 mg total) by mouth daily. 90 tablet 3   metoprolol tartrate (LOPRESSOR) 100 MG tablet Take 1 tablet (100 mg total) by mouth as directed. Take 2 hours prior to CT Scan ( Hold Toprol the day of CT Scan ) 1 tablet 0   MYRBETRIQ 50 MG TB24 tablet Take 50 mg by mouth daily.     NEOMYCIN-POLYMYXIN-HYDROCORTISONE (CORTISPORIN) 1 % SOLN OTIC solution Apply 1-2 drops to toe BID after soaking 10 mL 1   osimertinib mesylate (TAGRISSO) 80 MG tablet TAKE 1 TABLET BY MOUTH DAILY. 30 tablet 2   oxymetazoline (AFRIN) 0.05 % nasal spray Place 1 spray into both nostrils daily as needed for  congestion.     PREMARIN vaginal cream Place vaginally.     RABEprazole (ACIPHEX) 20 MG tablet Take 1 tablet (20 mg total) by mouth 2 (two) times a week.     rosuvastatin (CRESTOR) 5 MG tablet Take 1 tablet (5 mg total) by mouth daily. 90 tablet 3   triamcinolone cream (KENALOG) 0.1 % Apply 1 Application topically 2 (two) times daily. As needed for itching/rash 453.6 g 0   vitamin B-12 (CYANOCOBALAMIN) 1000 MCG tablet Take 1,000 mcg by mouth daily.     No current facility-administered medications for this visit.    SURGICAL HISTORY:  Past Surgical History:  Procedure Laterality Date   APPENDECTOMY     BRONCHIAL BIOPSY  10/20/2019   Procedure: BRONCHIAL BIOPSIES;  Surgeon: Leslye Peer, MD;  Location: [facility] ENDOSCOPY;  Service: Pulmonary;;   BRONCHIAL BRUSHINGS  10/20/2019   Procedure: BRONCHIAL BRUSHINGS;  Surgeon: Leslye Peer, MD;  Location: [facility] ENDOSCOPY;  Service: Pulmonary;;   BRONCHIAL NEEDLE ASPIRATION BIOPSY  10/20/2019   Procedure: BRONCHIAL NEEDLE ASPIRATION BIOPSIES;  Surgeon: Leslye Peer, MD;  Location: [facility] ENDOSCOPY;  Service: Pulmonary;;   BRONCHIAL WASHINGS  10/20/2019   Procedure: BRONCHIAL WASHINGS;  Surgeon: Leslye Peer, MD;  Location: [facility] ENDOSCOPY;  Service: Pulmonary;;   CARPAL TUNNEL RELEASE Right 2011   Dr. Amanda Pea   COLPORRHAPHY     posterior   HAND SURGERY Right 2016   Nerve surgery, Dr. Amanda Pea   OVARIAN CYST REMOVAL     RECTOCELE REPAIR  2011   w/TVH and sling   TONSILLECTOMY     TONSILLECTOMY     TOTAL KNEE ARTHROPLASTY Left 09/09/2018   Procedure: TOTAL KNEE ARTHROPLASTY, CORTISONE INJECTION RIGHT KNEE;  Surgeon: Durene Romans, MD;  Location: [facility] ORS;  Service: Orthopedics;  Laterality: Left;  70 mins   TOTAL VAGINAL HYSTERECTOMY  10/18/2009   rectocele repair, sling   TUBAL LIGATION Bilateral    VARICOSE VEIN SURGERY     VIDEO BRONCHOSCOPY WITH ENDOBRONCHIAL NAVIGATION N/A 10/20/2019   Procedure: VIDEO BRONCHOSCOPY WITH ENDOBRONCHIAL NAVIGATION;   Surgeon: Leslye Peer, MD;  Location: [facility] ENDOSCOPY;  Service: Pulmonary;  Laterality: N/A;    REVIEW OF SYSTEMS:  Constitutional: negative Eyes: negative Ears, nose, mouth, throat, and face: negative Respiratory: negative Cardiovascular: negative Gastrointestinal: negative Genitourinary:negative Integument/breast: negative Hematologic/lymphatic: negative Musculoskeletal:positive for back pain Neurological: negative Behavioral/Psych: negative Endocrine: negative Allergic/Immunologic: negative   PHYSICAL EXAMINATION: General appearance: alert, cooperative, fatigued, and no distress Head: Normocephalic, without obvious abnormality, atraumatic Neck: no adenopathy, no JVD, supple, symmetrical, trachea midline, and thyroid not enlarged, symmetric, no tenderness/mass/nodules Lymph nodes: Cervical, supraclavicular, and axillary nodes normal. Resp: clear to auscultation bilaterally Back: symmetric, no curvature. ROM normal. No CVA tenderness. Cardio: regular rate and rhythm, S1, S2 normal, no murmur, click, rub or gallop GI: soft, non-tender; bowel sounds normal; no masses,  no organomegaly Extremities: extremities normal, atraumatic, no cyanosis or edema Neurologic: Alert and oriented X 3, normal strength and tone. Normal symmetric reflexes. Normal coordination and gait  ECOG PERFORMANCE STATUS: 1 - Symptomatic but completely ambulatory  Blood pressure 103/77, pulse 88, temperature 98.1 F (36.7 C), temperature source Temporal, resp. rate 17, height 5\' 5"  (1.651 m), weight 163 lb 8 oz (74.2 kg), last menstrual period 08/21/2002, SpO2 99%.  LABORATORY DATA: Lab Results  Component Value Date   WBC 7.6 03/13/2023   HGB 12.2 03/13/2023   HCT 36.5 03/13/2023   MCV 96.1 03/13/2023   PLT 171 03/13/2023      Chemistry      Component Value Date/Time   NA 139 12/24/2022 1443   K 4.1 12/24/2022 1443   CL 105 12/24/2022 1443   CO2 28 12/24/2022 1443   BUN 13 12/24/2022 1443    CREATININE 1.24 (H) 12/24/2022 1443      Component Value Date/Time   CALCIUM 9.6 12/24/2022 1443   ALKPHOS 79 12/24/2022 1443   AST 14 (L) 12/24/2022 1443   ALT 12 12/24/2022 1443   BILITOT 0.5 12/24/2022 1443       RADIOGRAPHIC STUDIES: NM PET Image Restage (PS) Skull Base to Thigh (F-18 FDG) Result Date: 03/11/2023 CLINICAL DATA:  Subsequent treatment strategy for non-small cell lung cancer. EXAM: NUCLEAR MEDICINE PET SKULL BASE TO THIGH TECHNIQUE: 8.2 mCi F-18 FDG was injected  intravenously. Full-ring PET imaging was performed from the skull base to thigh after the radiotracer. CT data was obtained and used for attenuation correction and anatomic localization. Fasting blood glucose: 109 mg/dl COMPARISON:  CT chest 96/05/5407 and PET 11/02/2019. FINDINGS: Mediastinal blood pool activity: SUV max 3.4 Liver activity: SUV max NA NECK: No abnormal hypermetabolism. Incidental CT findings: None. CHEST: No abnormal hypermetabolism. Incidental CT findings: Coronary artery calcification. Heart is enlarged. No pericardial or pleural effusion. Post radiation parenchymal retraction in both perihilar regions. ABDOMEN/PELVIS: Similar mildly hypermetabolic left periaortic lymph nodes, SUV max 4.2, measuring up to 7 mm (4/126). Otherwise, no abnormal hypermetabolism. Incidental CT findings: 12 mm right adrenal nodule measures 41 Hounsfield units, has no abnormal hypermetabolism and is unchanged from 11/02/2019. No specific follow-up necessary. Low-attenuation lesions in the right kidney. No specific follow-up necessary. Small to moderate hiatal hernia. SKELETON: Scattered sclerotic lesions do not show metabolism above blood pool but are largely new from 11/02/2019. Incidental CT findings: Degenerative changes in the spine. IMPRESSION: 1. No evidence of metabolically active primary or metastatic lung cancer. 2. Quiescent osseous metastatic disease. 3. Chronic small mildly hypermetabolic left periaortic lymph nodes,  nonspecific. 4. 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 5. Small to moderate hiatal hernia. 6. Aortic atherosclerosis (ICD10-I70.0). Coronary artery calcification. Electronically Signed   By: Leanna Battles M.D.   On: 03/11/2023 13:31   DG HIP UNILAT WITH PELVIS 2-3 VIEWS LEFT Result Date: 03/03/2023 CLINICAL DATA:  Left hip pain for 2-3 weeks. EXAM: DG HIP (WITH OR WITHOUT PELVIS) 3V LEFT COMPARISON:  None Available. FINDINGS: There is bilateral hip degenerative change with joint space narrowing and small osteophytes. Pelvic ring is intact. No acute fracture, dislocation or subluxation. No osteolytic or osteoblastic lesions. IMPRESSION: Bilateral hip degenerative changes.  No acute osseous abnormalities. Electronically Signed   By: Layla Maw M.D.   On: 03/03/2023 22:02    ASSESSMENT AND PLAN: This is a very pleasant 73 years old white female recently diagnosed with a stage IIIc/IV (T3, N3, M0/M1a) non-small cell lung cancer, adenocarcinoma presented with large left upper lobe lung mass in addition to left infrahilar, right hilar and subcarinal lymphadenopathy and small left pleural effusion diagnosed in August 2021 with positive EGFR mutation in exon 21 (L858R). The patient completed a course of concurrent chemoradiation with weekly carboplatin and paclitaxel status post 7 cycles.  She tolerated her treatment well except for fatigue as well as a skin burn and cough. Her scan showed mild improvement of the left upper lobe lung mass as well as the mediastinal lymphadenopathy.  The patient continues to have small left pleural effusion that is suspicious given her presentation. She is currently on treatment with Tagrisso 80 mg p.o. daily started on January 31, 2020.  She is status post 37 months of treatment.  She has been tolerating this treatment well with no concerning adverse effects.  Stage III Non-Small Cell Lung Cancer (NSCLC) with EGFR Mutation Diagnosed in 2021 with stage III  NSCLC, adenocarcinoma subtype, with an EGFR mutation. Currently on Tagrisso (osimertinib) 80 mg once daily for 37 months. Recent PET scan shows bone activity below the threshold to definitively call it cancer, possibly due to arthritis but osseous metastases is still a consideration. Current treatment is effectively controlling the cancer. Biopsy considered only if radiation therapy is pursued. - Continue Tagrisso 80 mg once daily - Monitor symptoms and treat symptomatically - Follow-up in 2 months with lab work, no scan  Chronic Back Pain  Intermittent back pain, likely muscular/osseous metastases. Recent flare-up treated with a prednisone taper, providing some relief. Physical therapy planned. - Start physical therapy for back pain management  General Health Maintenance Pending knee surgery approval. Upcoming pulmonologist and cardiologist appointments for a calcium score test. Planning a trip to [place] in July, concerned about extensive walking. - Ensure clearance for knee surgery - Attend pulmonologist and cardiologist appointments - Prepare for trip to [place] considering physical limitations  Follow-up - Follow-up in 2 months with lab work, no scan.   The patient was advised to call immediately if she has any other concerning symptoms in the interval.  The patient voices understanding of current disease status and treatment options and is in agreement with the current care plan.  All questions were answered. The patient knows to call the clinic with any problems, questions or concerns. We can certainly see the patient much sooner if necessary.  Disclaimer: This note was dictated with voice recognition software. Similar sounding words can inadvertently be transcribed and may not be corrected upon review.

## 2023-03-18 ENCOUNTER — Ambulatory Visit (HOSPITAL_COMMUNITY): Payer: Medicare Other | Attending: Family Medicine

## 2023-03-18 ENCOUNTER — Other Ambulatory Visit: Payer: Self-pay

## 2023-03-18 DIAGNOSIS — R262 Difficulty in walking, not elsewhere classified: Secondary | ICD-10-CM | POA: Insufficient documentation

## 2023-03-18 DIAGNOSIS — M5459 Other low back pain: Secondary | ICD-10-CM | POA: Insufficient documentation

## 2023-03-18 DIAGNOSIS — I10 Essential (primary) hypertension: Secondary | ICD-10-CM | POA: Diagnosis not present

## 2023-03-18 DIAGNOSIS — F411 Generalized anxiety disorder: Secondary | ICD-10-CM | POA: Diagnosis not present

## 2023-03-18 DIAGNOSIS — M6281 Muscle weakness (generalized): Secondary | ICD-10-CM | POA: Insufficient documentation

## 2023-03-18 DIAGNOSIS — R7309 Other abnormal glucose: Secondary | ICD-10-CM | POA: Diagnosis not present

## 2023-03-18 DIAGNOSIS — Z6828 Body mass index (BMI) 28.0-28.9, adult: Secondary | ICD-10-CM | POA: Diagnosis not present

## 2023-03-18 DIAGNOSIS — E782 Mixed hyperlipidemia: Secondary | ICD-10-CM | POA: Diagnosis not present

## 2023-03-18 DIAGNOSIS — M25561 Pain in right knee: Secondary | ICD-10-CM | POA: Insufficient documentation

## 2023-03-18 DIAGNOSIS — E663 Overweight: Secondary | ICD-10-CM | POA: Diagnosis not present

## 2023-03-18 DIAGNOSIS — E039 Hypothyroidism, unspecified: Secondary | ICD-10-CM | POA: Diagnosis not present

## 2023-03-18 NOTE — Therapy (Signed)
[facility] OUTPATIENT PHYSICAL THERAPY EVALUATION     Patient Name: Linda Woods MRN: [id number] DOB:07-02-1950, 73 y.o., female Today's Date: 03/18/2023      END OF SESSION:   PT End of Session - 03/18/23 1106     Visit Number 1    Number of Visits 8    Date for PT Re-Evaluation 04/15/23    Authorization Type BCBC Medicare    Authorization Time Period no auth    PT Start Time 1100    PT Stop Time 1140    PT Time Calculation (min) 40 min    Activity Tolerance Patient tolerated treatment well    Behavior During Therapy WFL for tasks assessed/performed              Past Medical History:  Diagnosis Date   Anemia    as a child   Anxiety    Asthma 10/19/2020   Bursitis    Chronic reflux esophagitis    Complication of anesthesia    Diarrhea, functional    Diverticulosis    GERD (gastroesophageal reflux disease)    History of kidney stones    Hypertension    Knee pain    right knee-seeing ortho   lung ca 09/2019   Osteoarthritis    Plantar fasciitis    PONV (postoperative nausea and vomiting)    has used the patch before and that helps   Pure hypercholesterolemia    RLS (restless legs syndrome)    Superficial vein thrombosis    Thyroid disease    hypothyroid   Past Surgical History:  Procedure Laterality Date   APPENDECTOMY     BRONCHIAL BIOPSY  10/20/2019   Procedure: BRONCHIAL BIOPSIES;  Surgeon: Leslye Peer, MD;  Location: [facility];  Service: Pulmonary;;   BRONCHIAL BRUSHINGS  10/20/2019   Procedure: BRONCHIAL BRUSHINGS;  Surgeon: Leslye Peer, MD;  Location: [facility] ENDOSCOPY;  Service: Pulmonary;;   BRONCHIAL NEEDLE ASPIRATION BIOPSY  10/20/2019   Procedure: BRONCHIAL NEEDLE ASPIRATION BIOPSIES;  Surgeon: Leslye Peer, MD;  Location: [facility];  Service: Pulmonary;;   BRONCHIAL WASHINGS  10/20/2019   Procedure: BRONCHIAL WASHINGS;  Surgeon: Leslye Peer, MD;  Location: [facility];  Service: Pulmonary;;   CARPAL TUNNEL RELEASE Right 2011    Dr. Amanda Pea   COLPORRHAPHY     posterior   HAND SURGERY Right 2016   Nerve surgery, Dr. Amanda Pea   OVARIAN CYST REMOVAL     RECTOCELE REPAIR  2011   w/TVH and sling   TONSILLECTOMY     TONSILLECTOMY     TOTAL KNEE ARTHROPLASTY Left 09/09/2018   Procedure: TOTAL KNEE ARTHROPLASTY, CORTISONE INJECTION RIGHT KNEE;  Surgeon: Durene Romans, MD;  Location: [facility];  Service: Orthopedics;  Laterality: Left;  70 mins   TOTAL VAGINAL HYSTERECTOMY  10/18/2009   rectocele repair, sling   TUBAL LIGATION Bilateral    VARICOSE VEIN SURGERY     VIDEO BRONCHOSCOPY WITH ENDOBRONCHIAL NAVIGATION N/A 10/20/2019   Procedure: VIDEO BRONCHOSCOPY WITH ENDOBRONCHIAL NAVIGATION;  Surgeon: Leslye Peer, MD;  Location: [facility];  Service: Pulmonary;  Laterality: N/A;   Patient Active Problem List   Diagnosis Date Noted   Allergic rhinitis 10/19/2020   Asthma 10/19/2020   Cough 08/09/2020   Drug-induced diarrhea 02/22/2020   Adenocarcinoma of left lung, stage 3 (HCC) 11/03/2019   Encounter for antineoplastic chemotherapy 11/03/2019   Goals of care, counseling/discussion 11/03/2019   Pneumothorax, left 10/20/2019   Mass of left lung 10/16/2019  History of adenomatous polyp of colon 09/29/2018   Diverticulosis 09/29/2018   Internal hemorrhoids 09/29/2018   S/P left TKA 09/09/2018   Status post total left knee replacement 09/09/2018   Pain of left heel 07/03/2017   Pain in joint of right hip 05/17/2017   Benign essential hypertension 11/17/2015   Abnormal weight gain 11/17/2015   Obesity, Class I, BMI 30-34.9 11/17/2015   Gastroesophageal reflux disease without esophagitis 10/12/2014   Hypertriglyceridemia 10/12/2014   Hypertensive disorder 05/21/2013   Hypothyroidism 05/21/2013   Vitamin D deficiency 05/21/2013    PCP: Assunta Found, MDRef Provider (PCP)   REFERRING PROVIDER:   Pincus Badder, FNP    REFERRING DIAG:  Free Text Diagnosis  LBP    Rationale for Evaluation and  Treatment: Rehabilitation  THERAPY DIAG:  Difficulty in walking, not elsewhere classified  Muscle weakness (generalized)  Right knee pain, unspecified chronicity  Other low back pain  ONSET DATE: January 16, 2023    SUBJECTIVE:                                                                                                                                                                                           SUBJECTIVE STATEMENT: Patient was helping her daughter around Thanksgiving and felt her low back start to hurt. For about ~2 weeks, patient had severe difficulty moving so she was inactive for ~2 weeks using heating pad. Patient was in between changing PCPs so it took a while for her to get to Orthopedic for referral. Pt went to [facility] on 03/15/23 who was referred OPPT after left hip cortisone.   Patient is scheduled for right TKR on 04/16/23    PERTINENT HISTORY:  Terminal lung cancer for 3 years +; controlled with medicine TOTAL KNEE ARTHROPLASTY Left 09/09/2018   Asthma     PAIN:  Are you having pain? Yes: NPRS scale: 3/10 Pain location: across lumbar  Pain description: constant Aggravating factors: bending, ADLS, lifting  Relieving factors: lidocaine patch  PRECAUTIONS: None  RED FLAGS: None   WEIGHT BEARING RESTRICTIONS: No  FALLS:  Has patient fallen in last 6 months? 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  No acute osseous abnormalities.  SCREENING FOR RED FLAGS: Bowel or bladder incontinence: No Spinal tumors:  No Cauda equina syndrome: No Compression fracture: No Abdominal aneurysm: No  COGNITION: Overall cognitive status: Within functional limits for tasks assessed  POSTURE: decreased lumbar lordosis, increased thoracic kyphosis, and flexed trunk       FUNCTIONAL TESTS:  5 times sit to stand: 20.94s with no upper extremity assist, PT stand-by assist   20-29 years: 6.0  1.4 seconds 30-39 years: 6.1  1.4 seconds 40-49 years: 7.6  1.8 seconds 50-59 years: 7.7  2.6 seconds 60-69 years: 8.4  0.0 seconds (female), 12.7  1.8 seconds (female) 70-79 years: 11.6  3.4 seconds (female), 13.0  4.8 seconds (female) 80-89 years: 16.7  4.5 seconds (female), 17.2  5.5 seconds (female) 90+ years: 19.5  2.3 seconds (female), 22.9  9.6 seconds (female)  The Minimal Clinically Important Difference (MCID) is 2.3 seconds.  A score of 16 seconds or less indicates that the participant is not likely to fall.  A score of more than 16 seconds indicates a higher risk of falls  As per American Physical Therapy Association (APTA) website   2 minute walk test: next session    GAIT ANALYSIS:  Distance walked: 17ft Assistive device utilized: None Level of assistance: Complete Independence Comments: moderate antalgia right lower extremity; decreased cadence/step length   SENSATION: WFL    LOWER EXTREMITY MMT:    MMT Right eval Left eval  Hip flexion 3/5 3/5  Hip extension    Hip abduction 3+/5 3+/5  Hip adduction    Hip internal rotation    Hip external rotation    Knee flexion 3+/5 3+/5  Knee extension 4-/5 4-/5  Ankle dorsiflexion    Ankle plantarflexion    Ankle inversion    Ankle eversion     (Blank rows = not tested)  LOWER EXTREMITY ROM:     Active  Right eval Left eval  Hip flexion WFL   Hip extension    Hip abduction    Hip adduction    Hip internal rotation [facility] WFL  Hip external rotation [facility]  Knee flexion 0-120   Knee extension WFL   Ankle dorsiflexion    Ankle  plantarflexion    Ankle inversion    Ankle eversion     (Blank rows = not tested)  LOWER EXTREMITY SPECIAL TESTS  Luisa Hart (FABER) test: positive on right lower extremity    PALPATION: Moderate tenderness to palpation bilateral lumbar paraspinals  INTEGUMENTARY   WFL    TODAY'S TREATMENT:                                                                                                                              DATE:   03/18/23 PT I.E. HEP-see below   PATIENT EDUCATION:  Education details: HEP  Person educated: Patient Education method: Explanation Education comprehension: verbalized understanding  HOME EXERCISE PROGRAM: Access Code: [email or web address] URL: [email or web address] Date: 03/18/2023 Prepared by: Seymour Bars  Exercises - Supine March with Resistance Band  - 1-2 x daily - 3 sets - 10 reps - Hooklying Clamshell with Resistance  - 1-2 x daily - 3 sets - 10 reps    ASSESSMENT:  CLINICAL IMPRESSION: Patient is a 73 y.o. y.o. female who was seen today for physical therapy evaluation and treatment for troubles walking, weakness, right knee pain, lumbar pain . Patient presents to PT with the following objective impairments: Abnormal gait, decreased activity tolerance, difficulty walking, decreased strength, increased muscle spasms, improper body mechanics, postural dysfunction, and pain. These impairments limit the patient in activities such as carrying, lifting, bending, sitting, standing, squatting, stairs, locomotion level, and caring for others. These impairments also limit the patient in participation such as meal prep, cleaning, laundry, shopping, community activity, and yard work. The patient will benefit from PT to address the limitations/impairments listed below to return to their prior level of function in the domains of activity and participation.    PERSONAL FACTORS:     are also affecting patient's functional outcome.   REHAB POTENTIAL:  Good  CLINICAL DECISION MAKING: Stable/uncomplicated  EVALUATION COMPLEXITY: Low     GOALS: Goals reviewed with patient? No  SHORT TERM GOALS: Target date: 2 weeks  1. Patient will be independent with a basic stretching/strengthening HEP  Baseline:  Goal status: INITIAL   LONG TERM GOALS: Target date: 4 weeks  Patient will walk >/= 200 feet on the with 1-2/10 pain in ADL completion, home/community ambulation, and lifting/bending/squatting. Baseline:  Goal status: INITIAL  2.  Patient will complete the 5 times sit to stand:    within 16s to demonstrate an improvement in ADL completion, stair negotiation, household/community ambulation, and self-care Baseline:  Goal status: INITIAL  3.  Patient will be independent with a comprehensive strengthening HEP  Baseline:  Goal status: INITIAL     PLAN:  PT FREQUENCY: 2x/week  PT DURATION: 4 weeks  PLANNED INTERVENTIONS: 97110-Therapeutic exercises, 97530- Therapeutic activity, 97112- Neuromuscular re-education, 97535- Self Care, 40981- Manual therapy, [id number]- Gait training, 97014- Electrical stimulation (unattended), [phone]- Electrical stimulation (manual), Patient/Family education, Balance training, Stair training, Taping, Dry Needling, Joint mobilization, Joint manipulation, Spinal manipulation, Cryotherapy, and Moist heat.  PLAN FOR NEXT SESSION: Complete 2 MWT; begin hip/lumbar strengthening; add to [employer], PT 03/18/2023, 11:45 AM

## 2023-03-19 ENCOUNTER — Telehealth (HOSPITAL_COMMUNITY): Payer: Self-pay | Admitting: *Deleted

## 2023-03-19 NOTE — Telephone Encounter (Signed)
Reaching out to patient to offer assistance regarding upcoming cardiac imaging study; pt verbalizes understanding of appt date/time, parking situation and where to check in, pre-test NPO status and medications ordered, and verified current allergies; name and call back number provided for further questions should they arise Johney Frame RN Navigator Cardiac Imaging [facility] [phone] office [phone] cell

## 2023-03-20 ENCOUNTER — Ambulatory Visit (HOSPITAL_COMMUNITY)
Admission: RE | Admit: 2023-03-20 | Discharge: 2023-03-20 | Disposition: A | Discharge status: Home / Self Care | Payer: Medicare Other | Source: Ambulatory Visit | Attending: Student | Admitting: Student

## 2023-03-20 ENCOUNTER — Encounter (HOSPITAL_COMMUNITY): Payer: Self-pay

## 2023-03-20 DIAGNOSIS — R0609 Other forms of dyspnea: Secondary | ICD-10-CM | POA: Diagnosis not present

## 2023-03-20 DIAGNOSIS — R079 Chest pain, unspecified: Secondary | ICD-10-CM | POA: Diagnosis not present

## 2023-03-20 MED ORDER — NITROGLYCERIN 0.4 MG SL SUBL: 0.8000 mg | SUBLINGUAL_TABLET | Freq: Once | SUBLINGUAL | Status: DC

## 2023-03-20 MED ORDER — METOPROLOL TARTRATE 5 MG/5ML IV SOLN: 10.0000 mg | Freq: Once | INTRAVENOUS | Status: DC | PRN

## 2023-03-20 MED ORDER — DILTIAZEM HCL 25 MG/5ML IV SOLN
INTRAVENOUS | Status: AC
  Filled 2023-03-20: qty 5

## 2023-03-20 MED ORDER — DILTIAZEM HCL 25 MG/5ML IV SOLN
10.0000 mg | INTRAVENOUS | Status: DC | PRN
  Administered 2023-03-20: 5 mg via INTRAVENOUS

## 2023-03-20 NOTE — Progress Notes (Signed)
Spoke with Dr. Anne Fu about pt's HR (99) with SBP of 104. Verbal order to attempt to lower pt's HR with 10mg  Cardizem and scan if HR is close to 75. States patient may need to be rescheduled with a different premed.   Patient only given 5mg  of Cardizem due to SBP in the 90s. Will notify cardiac navigators to reschedule.

## 2023-03-21 ENCOUNTER — Encounter (HOSPITAL_COMMUNITY): Payer: Self-pay | Admitting: Physical Therapy

## 2023-03-21 ENCOUNTER — Telehealth: Payer: Self-pay | Admitting: Interventional Cardiology

## 2023-03-21 ENCOUNTER — Ambulatory Visit (HOSPITAL_COMMUNITY): Payer: Medicare Other | Admitting: Physical Therapy

## 2023-03-21 DIAGNOSIS — M5459 Other low back pain: Secondary | ICD-10-CM | POA: Diagnosis not present

## 2023-03-21 DIAGNOSIS — M6281 Muscle weakness (generalized): Secondary | ICD-10-CM | POA: Diagnosis not present

## 2023-03-21 DIAGNOSIS — R262 Difficulty in walking, not elsewhere classified: Secondary | ICD-10-CM

## 2023-03-21 DIAGNOSIS — M25561 Pain in right knee: Secondary | ICD-10-CM | POA: Diagnosis not present

## 2023-03-21 NOTE — Therapy (Signed)
[facility] OUTPATIENT PHYSICAL THERAPY Treatment     Patient Name: Linda Woods MRN: [id number] DOB:24-Jan-1951, 73 y.o., female Today's Date: 03/21/2023      END OF SESSION:   PT End of Session - 03/21/23 1014     Visit Number 2    Number of Visits 8    Date for PT Re-Evaluation 04/15/23    Authorization Type BCBC Medicare    Authorization Time Period no auth    PT Start Time 0930    PT Stop Time 1014    PT Time Calculation (min) 44 min    Activity Tolerance Patient tolerated treatment well    Behavior During Therapy WFL for tasks assessed/performed               Past Medical History:  Diagnosis Date   Anemia    as a child   Anxiety    Asthma 10/19/2020   Bursitis    Chronic reflux esophagitis    Complication of anesthesia    Diarrhea, functional    Diverticulosis    GERD (gastroesophageal reflux disease)    History of kidney stones    Hypertension    Knee pain    right knee-seeing ortho   lung ca 09/2019   Osteoarthritis    Plantar fasciitis    PONV (postoperative nausea and vomiting)    has used the patch before and that helps   Pure hypercholesterolemia    RLS (restless legs syndrome)    Superficial vein thrombosis    Thyroid disease    hypothyroid   Past Surgical History:  Procedure Laterality Date   APPENDECTOMY     BRONCHIAL BIOPSY  10/20/2019   Procedure: BRONCHIAL BIOPSIES;  Surgeon: Leslye Peer, MD;  Location: [facility];  Service: Pulmonary;;   BRONCHIAL BRUSHINGS  10/20/2019   Procedure: BRONCHIAL BRUSHINGS;  Surgeon: Leslye Peer, MD;  Location: [facility] ENDOSCOPY;  Service: Pulmonary;;   BRONCHIAL NEEDLE ASPIRATION BIOPSY  10/20/2019   Procedure: BRONCHIAL NEEDLE ASPIRATION BIOPSIES;  Surgeon: Leslye Peer, MD;  Location: [facility];  Service: Pulmonary;;   BRONCHIAL WASHINGS  10/20/2019   Procedure: BRONCHIAL WASHINGS;  Surgeon: Leslye Peer, MD;  Location: [facility];  Service: Pulmonary;;   CARPAL TUNNEL RELEASE Right 2011    Dr. Amanda Pea   COLPORRHAPHY     posterior   HAND SURGERY Right 2016   Nerve surgery, Dr. Amanda Pea   OVARIAN CYST REMOVAL     RECTOCELE REPAIR  2011   w/TVH and sling   TONSILLECTOMY     TONSILLECTOMY     TOTAL KNEE ARTHROPLASTY Left 09/09/2018   Procedure: TOTAL KNEE ARTHROPLASTY, CORTISONE INJECTION RIGHT KNEE;  Surgeon: Durene Romans, MD;  Location: [facility];  Service: Orthopedics;  Laterality: Left;  70 mins   TOTAL VAGINAL HYSTERECTOMY  10/18/2009   rectocele repair, sling   TUBAL LIGATION Bilateral    VARICOSE VEIN SURGERY     VIDEO BRONCHOSCOPY WITH ENDOBRONCHIAL NAVIGATION N/A 10/20/2019   Procedure: VIDEO BRONCHOSCOPY WITH ENDOBRONCHIAL NAVIGATION;  Surgeon: Leslye Peer, MD;  Location: [facility];  Service: Pulmonary;  Laterality: N/A;   Patient Active Problem List   Diagnosis Date Noted   Allergic rhinitis 10/19/2020   Asthma 10/19/2020   Cough 08/09/2020   Drug-induced diarrhea 02/22/2020   Adenocarcinoma of left lung, stage 3 (HCC) 11/03/2019   Encounter for antineoplastic chemotherapy 11/03/2019   Goals of care, counseling/discussion 11/03/2019   Pneumothorax, left 10/20/2019   Mass of left lung 10/16/2019  History of adenomatous polyp of colon 09/29/2018   Diverticulosis 09/29/2018   Internal hemorrhoids 09/29/2018   S/P left TKA 09/09/2018   Status post total left knee replacement 09/09/2018   Pain of left heel 07/03/2017   Pain in joint of right hip 05/17/2017   Benign essential hypertension 11/17/2015   Abnormal weight gain 11/17/2015   Obesity, Class I, BMI 30-34.9 11/17/2015   Gastroesophageal reflux disease without esophagitis 10/12/2014   Hypertriglyceridemia 10/12/2014   Hypertensive disorder 05/21/2013   Hypothyroidism 05/21/2013   Vitamin D deficiency 05/21/2013    PCP: Assunta Found, MDRef Provider (PCP)   REFERRING PROVIDER:   Pincus Badder, FNP    REFERRING DIAG:  Free Text Diagnosis  LBP    Rationale for Evaluation and  Treatment: Rehabilitation  THERAPY DIAG:  Difficulty in walking, not elsewhere classified  Muscle weakness (generalized)  Right knee pain, unspecified chronicity  Other low back pain  ONSET DATE: January 16, 2023    SUBJECTIVE:                                                                                                                                                                                           SUBJECTIVE STATEMENT: Pt states that her back pain is about an 8 today she has taken some pain medication.  Feels as if she needs review of her HEP.  Pt goal is to be able to walk three miles as she is going to [place].  Pt states when she sits she has immediate relief of back pain.   Patient is scheduled for right TKR on 04/16/23    PERTINENT HISTORY:  Terminal lung cancer for 3 years +; controlled with medicine TOTAL KNEE ARTHROPLASTY Left 09/09/2018   Asthma     PAIN:  Are you having pain? Yes: NPRS scale: 3/10 Pain location: across lumbar  Pain description: constant Aggravating factors: bending, ADLS, lifting  Relieving factors: lidocaine patch  PRECAUTIONS: None  RED FLAGS: None   WEIGHT BEARING RESTRICTIONS: No  FALLS:  Has patient fallen in last 6 months? 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  No acute osseous abnormalities.    SCREENING FOR RED FLAGS: Bowel or bladder incontinence: No Spinal tumors:  No Cauda equina syndrome: No Compression fracture: No Abdominal aneurysm: No  COGNITION: Overall cognitive status:  Within functional limits for tasks assessed  POSTURE: decreased lumbar lordosis, increased thoracic kyphosis, and flexed trunk       FUNCTIONAL TESTS:  5 times sit to stand: 20.94s with no upper extremity assist, PT stand-by assist   20-29 years: 6.0  1.4 seconds 30-39 years: 6.1  1.4 seconds 40-49 years: 7.6  1.8 seconds 50-59 years: 7.7  2.6 seconds 60-69 years: 8.4  0.0 seconds (female), 12.7  1.8 seconds (female) 70-79 years: 11.6  3.4 seconds (female), 13.0  4.8 seconds (female) 80-89 years: 16.7  4.5 seconds (female), 17.2  5.5 seconds (female) 90+ years: 19.5  2.3 seconds (female), 22.9  9.6 seconds (female)  The Minimal Clinically Important Difference (MCID) is 2.3 seconds.  A score of 16 seconds or less indicates that the participant is not likely to fall.  A score of more than 16 seconds indicates a higher risk of falls  As per American Physical Therapy Association (APTA) website   2 minute walk test: next session    GAIT ANALYSIS:  Distance walked: 77ft Assistive device utilized: None Level of assistance: Complete Independence Comments: moderate antalgia right lower extremity; decreased cadence/step length   SENSATION: WFL    LOWER EXTREMITY MMT:    MMT Right eval Right  Left eval Left   Hip flexion 3/5  3/5   Hip extension  3+  3  Hip abduction 3+/5  3+/5   Hip adduction      Hip internal rotation      Hip external rotation      Knee flexion 3+/5  3+/5   Knee extension 4-/5  4-/5   Ankle dorsiflexion      Ankle plantarflexion      Ankle inversion      Ankle eversion       (Blank rows = not tested)  LOWER EXTREMITY ROM:     Active  Right eval Left eval  Hip flexion WFL   Hip extension    Hip abduction    Hip adduction    Hip internal rotation [facility]  Hip external rotation [facility]  Knee flexion 0-120   Knee extension WFL   Ankle dorsiflexion    Ankle plantarflexion    Ankle inversion    Ankle eversion     (Blank rows = not  tested)  LOWER EXTREMITY SPECIAL TESTS  Luisa Hart (FABER) test: positive on right lower extremity    PALPATION: Moderate tenderness to palpation bilateral lumbar paraspinals  INTEGUMENTARY   [facility]  03/21/23 326 ft walk in 2 minutes pain in knee that she is to get a TKR  5/10   TODAY'S TREATMENT:                                                                                                                              DATE:  03/21/23 Wall arch x  10 Seated: Tall posture x 10 Scapular retraction x 10 Supine: Resisted clam x 10 Each Resisted Marching x 10 Resisted Bridge x 10 Bent knee lift x 10     03/18/23 PT I.E. HEP-see below   PATIENT EDUCATION:  Education details: HEP  Person educated: Patient Education method: Explanation Education comprehension: verbalized understanding  HOME EXERCISE PROGRAM: Access Code: [email or web address] URL: [email or web address] Date: 03/18/2023 Prepared by: Seymour Bars  Exercises - Supine March with Resistance Band  - 1-2 x daily - 3 sets - 10 reps - Hooklying Clamshell with Resistance  - 1-2 x daily - 3 sets - 10 reps  03/21/23 - Seated Correct Posture  - 2 x daily - 7 x weekly - 1 sets - 10 reps - 3-5: hold - Seated Scapular Retraction  - 2 x daily - 7 x weekly - 1 sets - 10 reps - 3-5" hold - Supine Bridge with Resistance Band  - 1-2 x daily - 7 x weekly - 1 sets - 10 reps - 3-5 hold - Supine March  - 1-2 x daily - 7 x weekly - 1 sets - 10 reps - 3-5 hold  ASSESSMENT:  CLINICAL IMPRESSION:  Therapist reviewed goals and evaluation with pt.  Completed mm testing for gluteal mm as well as 2 MWT.  Pt needs cuing to complete exercises with proper stabilization but able to show good form after cuing.  presents to PT with the following objective impairments: Abnormal gait, decreased activity tolerance, difficulty walking, decreased strength, increased muscle spasms, improper body mechanics, postural dysfunction, and pain. These impairments  limit the patient in activities such as carrying, lifting, bending, sitting, standing, squatting, stairs, locomotion level, and caring for others. These impairments also limit the patient in participation such as meal prep, cleaning, laundry, shopping, community activity, and yard work. The patient will benefit from PT to address the limitations/impairments listed below to return to their prior level of function in the domains of activity and participation.    REHAB POTENTIAL: Good  CLINICAL DECISION MAKING: Stable/uncomplicated  EVALUATION COMPLEXITY: Low     GOALS: Goals reviewed with patient? No  SHORT TERM GOALS: Target date: 2 weeks  1. Patient will be independent with a basic stretching/strengthening HEP  Baseline:  Goal status: on-going    LONG TERM GOALS: Target date: 4 weeks  Patient will walk >/= 200 feet on the with 1-2/10 pain in ADL completion, home/community ambulation, and lifting/bending/squatting. Baseline:  Goal status: on-going   2.  Patient will complete the 5 times sit to stand:    within 16s to demonstrate an improvement in ADL completion, stair negotiation, household/community ambulation, and self-care Baseline:  Goal status: on-going   3.  Patient will be independent with a comprehensive strengthening HEP  Baseline:  Goal status: on-going     PLAN:  PT FREQUENCY: 2x/week  PT DURATION: 4 weeks  PLANNED INTERVENTIONS: 97110-Therapeutic exercises, 97530- Therapeutic activity, 97112- Neuromuscular re-education, 97535- Self Care, [id number]- Manual therapy, [id number]- Gait training, 97014- Electrical stimulation (unattended), [id number]- Electrical stimulation (manual), Patient/Family education, Balance training, Stair training, Taping, Dry Needling, Joint mobilization, Joint manipulation, Spinal manipulation, Cryotherapy, and Moist heat.  PLAN FOR NEXT SESSION: add knee to chest stretch, standing stabilization.   Virgina Organ, PT CLT [phone]   03/21/2023, 10:16 AM

## 2023-03-21 NOTE — Telephone Encounter (Signed)
Left message for patient to call back

## 2023-03-21 NOTE — Telephone Encounter (Signed)
Patient states she is returning call. Please advise

## 2023-03-25 ENCOUNTER — Ambulatory Visit (HOSPITAL_COMMUNITY): Payer: Medicare Other | Attending: Family Medicine

## 2023-03-25 ENCOUNTER — Encounter (HOSPITAL_COMMUNITY): Payer: Self-pay

## 2023-03-25 DIAGNOSIS — R262 Difficulty in walking, not elsewhere classified: Secondary | ICD-10-CM | POA: Diagnosis not present

## 2023-03-25 DIAGNOSIS — M5459 Other low back pain: Secondary | ICD-10-CM | POA: Diagnosis not present

## 2023-03-25 DIAGNOSIS — M25561 Pain in right knee: Secondary | ICD-10-CM | POA: Diagnosis not present

## 2023-03-25 DIAGNOSIS — M6281 Muscle weakness (generalized): Secondary | ICD-10-CM | POA: Insufficient documentation

## 2023-03-25 NOTE — Therapy (Signed)
[facility] OUTPATIENT PHYSICAL THERAPY Treatment     Patient Name: Linda Woods MRN: [id number] DOB:05/23/1950, 73 y.o., female Today's Date: 03/25/2023      END OF SESSION:   PT End of Session - 03/25/23 0845     Visit Number 3    Number of Visits 8    Date for PT Re-Evaluation 04/15/23    Authorization Type [employer] Medicare    Authorization Time Period no auth    PT Start Time 0847    PT Stop Time 0928    PT Time Calculation (min) 41 min    Activity Tolerance Patient tolerated treatment well    Behavior During Therapy [facility] for tasks assessed/performed               Past Medical History:  Diagnosis Date   Anemia    as a child   Anxiety    Asthma 10/19/2020   Bursitis    Chronic reflux esophagitis    Complication of anesthesia    Diarrhea, functional    Diverticulosis    GERD (gastroesophageal reflux disease)    History of kidney stones    Hypertension    Knee pain    right knee-seeing ortho   lung ca 09/2019   Osteoarthritis    Plantar fasciitis    PONV (postoperative nausea and vomiting)    has used the patch before and that helps   Pure hypercholesterolemia    RLS (restless legs syndrome)    Superficial vein thrombosis    Thyroid disease    hypothyroid   Past Surgical History:  Procedure Laterality Date   APPENDECTOMY     BRONCHIAL BIOPSY  10/20/2019   Procedure: BRONCHIAL BIOPSIES;  Surgeon: Leslye Peer, MD;  Location: [facility] ENDOSCOPY;  Service: Pulmonary;;   BRONCHIAL BRUSHINGS  10/20/2019   Procedure: BRONCHIAL BRUSHINGS;  Surgeon: Leslye Peer, MD;  Location: [facility] ENDOSCOPY;  Service: Pulmonary;;   BRONCHIAL NEEDLE ASPIRATION BIOPSY  10/20/2019   Procedure: BRONCHIAL NEEDLE ASPIRATION BIOPSIES;  Surgeon: Leslye Peer, MD;  Location: [facility] ENDOSCOPY;  Service: Pulmonary;;   BRONCHIAL WASHINGS  10/20/2019   Procedure: BRONCHIAL WASHINGS;  Surgeon: Leslye Peer, MD;  Location: [facility] ENDOSCOPY;  Service: Pulmonary;;   CARPAL TUNNEL RELEASE Right 2011    Dr. Amanda Pea   COLPORRHAPHY     posterior   HAND SURGERY Right 2016   Nerve surgery, Dr. Amanda Pea   OVARIAN CYST REMOVAL     RECTOCELE REPAIR  2011   w/TVH and sling   TONSILLECTOMY     TONSILLECTOMY     TOTAL KNEE ARTHROPLASTY Left 09/09/2018   Procedure: TOTAL KNEE ARTHROPLASTY, CORTISONE INJECTION RIGHT KNEE;  Surgeon: Durene Romans, MD;  Location: [facility] ORS;  Service: Orthopedics;  Laterality: Left;  70 mins   TOTAL VAGINAL HYSTERECTOMY  10/18/2009   rectocele repair, sling   TUBAL LIGATION Bilateral    VARICOSE VEIN SURGERY     VIDEO BRONCHOSCOPY WITH ENDOBRONCHIAL NAVIGATION N/A 10/20/2019   Procedure: VIDEO BRONCHOSCOPY WITH ENDOBRONCHIAL NAVIGATION;  Surgeon: Leslye Peer, MD;  Location: [facility] ENDOSCOPY;  Service: Pulmonary;  Laterality: N/A;   Patient Active Problem List   Diagnosis Date Noted   Allergic rhinitis 10/19/2020   Asthma 10/19/2020   Cough 08/09/2020   Drug-induced diarrhea 02/22/2020   Adenocarcinoma of left lung, stage 3 (HCC) 11/03/2019   Encounter for antineoplastic chemotherapy 11/03/2019   Goals of care, counseling/discussion 11/03/2019   Pneumothorax, left 10/20/2019   Mass of left lung 10/16/2019  History of adenomatous polyp of colon 09/29/2018   Diverticulosis 09/29/2018   Internal hemorrhoids 09/29/2018   S/P left TKA 09/09/2018   Status post total left knee replacement 09/09/2018   Pain of left heel 07/03/2017   Pain in joint of right hip 05/17/2017   Benign essential hypertension 11/17/2015   Abnormal weight gain 11/17/2015   Obesity, Class I, BMI 30-34.9 11/17/2015   Gastroesophageal reflux disease without esophagitis 10/12/2014   Hypertriglyceridemia 10/12/2014   Hypertensive disorder 05/21/2013   Hypothyroidism 05/21/2013   Vitamin D deficiency 05/21/2013    PCP: Assunta Found, MDRef Provider (PCP)   REFERRING PROVIDER:   Pincus Badder, FNP    REFERRING DIAG:  Free Text Diagnosis  LBP    Rationale for Evaluation and  Treatment: Rehabilitation  THERAPY DIAG:  Difficulty in walking, not elsewhere classified  Muscle weakness (generalized)  Right knee pain, unspecified chronicity  Other low back pain  ONSET DATE: January 16, 2023    SUBJECTIVE:                                                                                                                                                                                           SUBJECTIVE STATEMENT:  Pt reports increased pain over weekend in lower back and Rt knee, current pain scale 7/10 and reports of cramping.  Patient is scheduled for right TKR on 04/16/23    PERTINENT HISTORY:  Terminal lung cancer for 3 years +; controlled with medicine TOTAL KNEE ARTHROPLASTY Left 09/09/2018   Asthma     PAIN:  Are you having pain? Yes: NPRS scale: 3/10 Pain location: across lumbar  Pain description: constant Aggravating factors: bending, ADLS, lifting  Relieving factors: lidocaine patch  PRECAUTIONS: None  RED FLAGS: None   WEIGHT BEARING RESTRICTIONS: No  FALLS:  Has patient fallen in last 6 months? 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  No acute osseous abnormalities.    SCREENING FOR RED FLAGS: Bowel or bladder incontinence: No Spinal tumors: No Cauda equina syndrome: No Compression fracture: No Abdominal aneurysm: No  COGNITION: Overall cognitive status: Within functional limits for tasks assessed  POSTURE: decreased lumbar lordosis, increased thoracic kyphosis, and flexed trunk  FUNCTIONAL  TESTS:  5 times sit to stand: 20.94s with no upper extremity assist, PT stand-by assist   20-29 years: 6.0  1.4 seconds 30-39 years: 6.1  1.4 seconds 40-49 years: 7.6  1.8 seconds 50-59 years: 7.7  2.6 seconds 60-69 years: 8.4  0.0 seconds (female), 12.7  1.8 seconds (female) 70-79 years: 11.6  3.4 seconds (female), 13.0  4.8 seconds (female) 80-89 years: 16.7  4.5 seconds (female), 17.2  5.5 seconds (female) 90+ years: 19.5  2.3 seconds (female), 22.9  9.6 seconds (female)  The Minimal Clinically Important Difference (MCID) is 2.3 seconds.  A score of 16 seconds or less indicates that the participant is not likely to fall.  A score of more than 16 seconds indicates a higher risk of falls  As per American Physical Therapy Association (APTA) website   2 minute walk test: next session    GAIT ANALYSIS:  Distance walked: 67ft Assistive device utilized: None Level of assistance: Complete Independence Comments: moderate antalgia right lower extremity; decreased cadence/step length   SENSATION: WFL    LOWER EXTREMITY MMT:    MMT Right eval Right  Left eval Left   Hip flexion 3/5  3/5   Hip extension  3+  3  Hip abduction 3+/5  3+/5   Hip adduction      Hip internal rotation      Hip external rotation      Knee flexion 3+/5  3+/5   Knee extension 4-/5  4-/5   Ankle dorsiflexion      Ankle plantarflexion      Ankle inversion      Ankle eversion       (Blank rows = not tested)  LOWER EXTREMITY ROM:     Active  Right eval Left eval  Hip flexion WFL   Hip extension    Hip abduction    Hip adduction    Hip internal rotation [facility] WFL  Hip external rotation [facility]  Knee flexion 0-120   Knee extension WFL   Ankle dorsiflexion    Ankle plantarflexion    Ankle inversion    Ankle eversion     (Blank rows = not tested)  LOWER EXTREMITY SPECIAL TESTS  Luisa Hart (FABER) test: positive on right lower extremity    PALPATION: Moderate tenderness to palpation  bilateral lumbar paraspinals  INTEGUMENTARY   [facility]  03/21/23 326 ft walk in 2 minutes pain in knee that she is to get a TKR  5/10   TODAY'S TREATMENT:                                                                                                                              DATE:  03/25/23: Gait training with SPC x226 with cueing for sequence, heel to toe and equal step length  Supine: Resisted bridge 10x 5" with GTB around thigh Resisted marching with ab set 10x5" SKTC 3x 20" Quad sets 10x 5" SLR with core  and quad set prior raise 10x  Sidelying:  Clam with GTB  STS no HHA eccentric control Wback 10x 5" Rows with GTB 10x  03/21/23 Wall arch x 10 Seated: Tall posture x 10 Scapular retraction x 10 Supine: Resisted clam x 10 Each Resisted Marching x 10 Resisted Bridge x 10 Bent knee lift x 10     03/18/23 PT I.E. HEP-see below   PATIENT EDUCATION:  Education details: HEP  Person educated: Patient Education method: Explanation Education comprehension: verbalized understanding  HOME EXERCISE PROGRAM: Access Code: [email or web address] URL: [email or web address] Date: 03/18/2023 Prepared by: Seymour Bars  Exercises - Supine March with Resistance Band  - 1-2 x daily - 3 sets - 10 reps - Hooklying Clamshell with Resistance  - 1-2 x daily - 3 sets - 10 reps  03/21/23 - Seated Correct Posture  - 2 x daily - 7 x weekly - 1 sets - 10 reps - 3-5: hold - Seated Scapular Retraction  - 2 x daily - 7 x weekly - 1 sets - 10 reps - 3-5" hold - Supine Bridge with Resistance Band  - 1-2 x daily - 7 x weekly - 1 sets - 10 reps - 3-5 hold - Supine March  - 1-2 x daily - 7 x weekly - 1 sets - 10 reps - 3-5 hold  Access Code: [email or web address] URL: [email or web address] Date: 03/25/2023 Prepared by: Becky Sax  Exercises - Clam with Resistance  - 1-2 x daily - 7 x weekly - 1 sets - 10 reps - Supine Quad Set  - 2 x daily - 7 x weekly - 1 sets - 10 reps - Active  Straight Leg Raise with Quad Set  - 1 x daily - 7 x weekly - 3 sets - 10 reps - Hooklying Single Knee to Chest  - 1-2 x daily - 7 x weekly - 1 sets - 3 reps - 20" hold  ASSESSMENT:  CLINICAL IMPRESSION:  Educated on assistance with SPC during gait for support to assist with pain control, cueing for sequence, heel to toe mechanics and equal stride length.  Session focus with core and proximal strengthening.  Progressed strength with additional exercises with min cueing for core activation prior movement.  Pt educated on importance of strengthening and mobility prior surgery, plans for TKR on 04/16/23.  Added quad sets and SLR for quad strengthening to HEP to improve gait mechanics.  EOS pt reports LBP resolved, does continue to have knee pain with weight bearing.     Therapist reviewed goals and evaluation with pt.  Completed mm testing for gluteal mm as well as 2 MWT.  Pt needs cuing to complete exercises with proper stabilization but able to show good form after cuing.  presents to PT with the following objective impairments: Abnormal gait, decreased activity tolerance, difficulty walking, decreased strength, increased muscle spasms, improper body mechanics, postural dysfunction, and pain. These impairments limit the patient in activities such as carrying, lifting, bending, sitting, standing, squatting, stairs, locomotion level, and caring for others. These impairments also limit the patient in participation such as meal prep, cleaning, laundry, shopping, community activity, and yard work. The patient will benefit from PT to address the limitations/impairments listed below to return to their prior level of function in the domains of activity and participation.    REHAB POTENTIAL: Good  CLINICAL DECISION MAKING: Stable/uncomplicated  EVALUATION COMPLEXITY: Low     GOALS: Goals reviewed with patient? No  SHORT TERM GOALS: Target date: 2 weeks  1.  Patient will be independent with a basic  stretching/strengthening HEP  Baseline:  Goal status: on-going    LONG TERM GOALS: Target date: 4 weeks  Patient will walk >/= 200 feet on the with 1-2/10 pain in ADL completion, home/community ambulation, and lifting/bending/squatting. Baseline:  Goal status: on-going   2.  Patient will complete the 5 times sit to stand:    within 16s to demonstrate an improvement in ADL completion, stair negotiation, household/community ambulation, and self-care Baseline:  Goal status: on-going   3.  Patient will be independent with a comprehensive strengthening HEP  Baseline:  Goal status: on-going     PLAN:  PT FREQUENCY: 2x/week  PT DURATION: 4 weeks  PLANNED INTERVENTIONS: 97110-Therapeutic exercises, 97530- Therapeutic activity, 97112- Neuromuscular re-education, 97535- Self Care, [id number]- Manual therapy, [id number]- Gait training, 97014- Electrical stimulation (unattended), [phone]- Electrical stimulation (manual), Patient/Family education, Balance training, Stair training, Taping, Dry Needling, Joint mobilization, Joint manipulation, Spinal manipulation, Cryotherapy, and Moist heat.  PLAN FOR NEXT SESSION: Progress to standing stabilization.   Becky Sax, LPTA/CLT; [place] [phone]  03/25/2023, 12:13 PM

## 2023-03-25 NOTE — Telephone Encounter (Signed)
Looks like possibly she was called to schedule her CT.  Will route to [place] office.

## 2023-03-28 ENCOUNTER — Other Ambulatory Visit (HOSPITAL_COMMUNITY): Payer: Self-pay

## 2023-03-28 ENCOUNTER — Ambulatory Visit (HOSPITAL_COMMUNITY): Payer: Medicare Other

## 2023-03-28 ENCOUNTER — Encounter (HOSPITAL_COMMUNITY): Payer: Self-pay

## 2023-03-28 DIAGNOSIS — M25561 Pain in right knee: Secondary | ICD-10-CM

## 2023-03-28 DIAGNOSIS — M5459 Other low back pain: Secondary | ICD-10-CM

## 2023-03-28 DIAGNOSIS — M6281 Muscle weakness (generalized): Secondary | ICD-10-CM | POA: Diagnosis not present

## 2023-03-28 DIAGNOSIS — R262 Difficulty in walking, not elsewhere classified: Secondary | ICD-10-CM | POA: Diagnosis not present

## 2023-03-28 NOTE — Therapy (Signed)
 [facility] Treatment     Patient Name: Linda Woods MRN: [id number] DOB:1951-01-05, 73 y.o., female Today's Date: 03/28/2023      END OF SESSION:   PT End of Session - 03/28/23 0931     Visit Number 4    Number of Visits 8    Date for PT Re-Evaluation 04/15/23    Authorization Type [employer] Medicare    Authorization Time Period no auth    PT Start Time 0932    PT Stop Time 1012    PT Time Calculation (min) 40 min    Activity Tolerance Patient tolerated treatment well;Patient limited by pain;No increased pain    Behavior During Therapy WFL for tasks assessed/performed               Past Medical History:  Diagnosis Date   Anemia    as a child   Anxiety    Asthma 10/19/2020   Bursitis    Chronic reflux esophagitis    Complication of anesthesia    Diarrhea, functional    Diverticulosis    GERD (gastroesophageal reflux disease)    History of kidney stones    Hypertension    Knee pain    right knee-seeing ortho   lung ca 09/2019   Osteoarthritis    Plantar fasciitis    PONV (postoperative nausea and vomiting)    has used the patch before and that helps   Pure hypercholesterolemia    RLS (restless legs syndrome)    Superficial vein thrombosis    Thyroid  disease    hypothyroid   Past Surgical History:  Procedure Laterality Date   APPENDECTOMY     BRONCHIAL BIOPSY  10/20/2019   Procedure: BRONCHIAL BIOPSIES;  Surgeon: Shelah Lamar RAMAN, MD;  Location: [facility] ENDOSCOPY;  Service: Pulmonary;;   BRONCHIAL BRUSHINGS  10/20/2019   Procedure: BRONCHIAL BRUSHINGS;  Surgeon: Shelah Lamar RAMAN, MD;  Location: [facility] ENDOSCOPY;  Service: Pulmonary;;   BRONCHIAL NEEDLE ASPIRATION BIOPSY  10/20/2019   Procedure: BRONCHIAL NEEDLE ASPIRATION BIOPSIES;  Surgeon: Shelah Lamar RAMAN, MD;  Location: [facility] ENDOSCOPY;  Service: Pulmonary;;   BRONCHIAL WASHINGS  10/20/2019   Procedure: BRONCHIAL WASHINGS;  Surgeon: Shelah Lamar RAMAN, MD;  Location: [facility] ENDOSCOPY;  Service:  Pulmonary;;   CARPAL TUNNEL RELEASE Right 2011   Dr. Camella   COLPORRHAPHY     posterior   HAND SURGERY Right 2016   Nerve surgery, Dr. Camella   OVARIAN CYST REMOVAL     RECTOCELE REPAIR  2011   w/TVH and sling   TONSILLECTOMY     TONSILLECTOMY     TOTAL KNEE ARTHROPLASTY Left 09/09/2018   Procedure: TOTAL KNEE ARTHROPLASTY, CORTISONE INJECTION RIGHT KNEE;  Surgeon: Ernie Cough, MD;  Location: [facility] ORS;  Service: Orthopedics;  Laterality: Left;  70 mins   TOTAL VAGINAL HYSTERECTOMY  10/18/2009   rectocele repair, sling   TUBAL LIGATION Bilateral    VARICOSE VEIN SURGERY     VIDEO BRONCHOSCOPY WITH ENDOBRONCHIAL NAVIGATION N/A 10/20/2019   Procedure: VIDEO BRONCHOSCOPY WITH ENDOBRONCHIAL NAVIGATION;  Surgeon: Shelah Lamar RAMAN, MD;  Location: [facility] ENDOSCOPY;  Service: Pulmonary;  Laterality: N/A;   Patient Active Problem List   Diagnosis Date Noted   Allergic rhinitis 10/19/2020   Asthma 10/19/2020   Cough 08/09/2020   Drug-induced diarrhea 02/22/2020   Adenocarcinoma of left lung, stage 3 (HCC) 11/03/2019   Encounter for antineoplastic chemotherapy 11/03/2019   Goals of care, counseling/discussion 11/03/2019   Pneumothorax, left 10/20/2019   Mass  of left lung 10/16/2019   History of adenomatous polyp of colon 09/29/2018   Diverticulosis 09/29/2018   Internal hemorrhoids 09/29/2018   S/P left TKA 09/09/2018   Status post total left knee replacement 09/09/2018   Pain of left heel 07/03/2017   Pain in joint of right hip 05/17/2017   Benign essential hypertension 11/17/2015   Abnormal weight gain 11/17/2015   Obesity, Class I, BMI 30-34.9 11/17/2015   Gastroesophageal reflux disease without esophagitis 10/12/2014   Hypertriglyceridemia 10/12/2014   Hypertensive disorder 05/21/2013   Hypothyroidism 05/21/2013   Vitamin D  deficiency 05/21/2013    PCP: [name], MDRef Provider (PCP)   REFERRING PROVIDER:   Faye Lauraine PARAS, FNP    REFERRING DIAG:  Free Text  Diagnosis  LBP    Rationale for Evaluation and Treatment: Rehabilitation  THERAPY DIAG:  Difficulty in walking, not elsewhere classified  Muscle weakness (generalized)  Right knee pain, unspecified chronicity  Other low back pain  ONSET DATE: January 16, 2023    SUBJECTIVE:                                                                                                                                                                                           SUBJECTIVE STATEMENT:  Reports increased pain in back pain and Rt knee, current pain scale 6/10.  Stated she has not began walking with SPC at home, stated she thinks it is at [place].    Patient is scheduled for right TKR on 04/16/23    PERTINENT HISTORY:  Terminal lung cancer for 3 years +; controlled with medicine TOTAL KNEE ARTHROPLASTY Left 09/09/2018   Asthma     PAIN:  Are you having pain? Yes: NPRS scale: 3/10 Pain location: across lumbar  Pain description: constant Aggravating factors: bending, ADLS, lifting  Relieving factors: lidocaine  patch  PRECAUTIONS: None  RED FLAGS: None   WEIGHT BEARING RESTRICTIONS: No  FALLS:  Has patient fallen in last 6 months? No  LIVING ENVIRONMENT: Lives with: lives with their spouse Lives in: House/apartment Stairs: Yes: External: 1 steps; can reach both Has following equipment at home: Single point cane, Environmental Consultant - 2 wheeled, Tour manager, and Grab bars  OCCUPATION: Barista- retired; geographical information systems officer of [employer]   PLOF: Independent  PATIENT GOALS: To walk pain   NEXT MD VISIT: Jan 29    OBJECTIVE:   DIAGNOSTIC FINDINGS:  IMPRESSION: Bilateral hip degenerative changes.  No acute osseous abnormalities.    SCREENING FOR RED FLAGS: Bowel or bladder incontinence: No Spinal tumors: No Cauda equina syndrome: No Compression fracture: No Abdominal aneurysm: No  COGNITION: Overall cognitive status: Within  functional limits for tasks  assessed  POSTURE: decreased lumbar lordosis, increased thoracic kyphosis, and flexed trunk       FUNCTIONAL TESTS:  5 times sit to stand: 20.94s with no upper extremity assist, PT stand-by assist   20-29 years: 6.0  1.4 seconds 30-39 years: 6.1  1.4 seconds 40-49 years: 7.6  1.8 seconds 50-59 years: 7.7  2.6 seconds 60-69 years: 8.4  0.0 seconds (female), 12.7  1.8 seconds (female) 70-79 years: 11.6  3.4 seconds (female), 13.0  4.8 seconds (female) 80-89 years: 16.7  4.5 seconds (female), 17.2  5.5 seconds (female) 90+ years: 19.5  2.3 seconds (female), 22.9  9.6 seconds (female)  The Minimal Clinically Important Difference (MCID) is 2.3 seconds.  A score of 16 seconds or less indicates that the participant is not likely to fall.  A score of more than 16 seconds indicates a higher risk of falls  As per American Physical Therapy Association (APTA) website   2 minute walk test: next session    GAIT ANALYSIS:  Distance walked: 40ft Assistive device utilized: None Level of assistance: Complete Independence Comments: moderate antalgia right lower extremity; decreased cadence/step length   SENSATION: WFL    LOWER EXTREMITY MMT:    MMT Right eval Right  Left eval Left   Hip flexion 3/5  3/5   Hip extension  3+  3  Hip abduction 3+/5  3+/5   Hip adduction      Hip internal rotation      Hip external rotation      Knee flexion 3+/5  3+/5   Knee extension 4-/5  4-/5   Ankle dorsiflexion      Ankle plantarflexion      Ankle inversion      Ankle eversion       (Blank rows = not tested)  LOWER EXTREMITY ROM:     Active  Right eval Left eval  Hip flexion WFL   Hip extension    Hip abduction    Hip adduction    Hip internal rotation [facility]  Hip external rotation [facility]  Knee flexion 0-120   Knee extension WFL   Ankle dorsiflexion    Ankle plantarflexion    Ankle inversion    Ankle eversion     (Blank rows = not tested)  LOWER EXTREMITY SPECIAL  TESTS  Belvie (FABER) test: positive on right lower extremity    PALPATION: Moderate tenderness to palpation bilateral lumbar paraspinals  INTEGUMENTARY   [facility]  03/21/23 326 ft walk in 2 minutes pain in knee that she is to get a TKR  5/10   TODAY'S TREATMENT:                                                                                                                              DATE:  03/28/23: Standing: Marching 10x 3 with ab set Heel raise 10x Toe raise 10x Abduction 10x  RTB rows10x RTB shoulder  extension  Supine: Bridge with GTB 10x 5 Clam with ab set GTB 10x 5 each Marching with GTB around thigh with ab set 10x 5 SLR with ab set and quad set prior raise 10x SKTC 2x 30 Hamstring stretch 2x 30  Sidelying: Abduction 10x  03/25/23: Gait training with SPC x226 with cueing for sequence, heel to toe and equal step length  Supine: Resisted bridge 10x 5 with GTB around thigh Resisted marching with ab set 10x5 SKTC 3x 20 Quad sets 10x 5 SLR with core and quad set prior raise 10x  Sidelying:  Clam with GTB  STS no HHA eccentric control Wback 10x 5 Rows with GTB 10x  03/21/23 Wall arch x 10 Seated: Tall posture x 10 Scapular retraction x 10 Supine: Resisted clam x 10 Each Resisted Marching x 10 Resisted Bridge x 10 Bent knee lift x 10     03/18/23 PT I.E. HEP-see below   PATIENT EDUCATION:  Education details: HEP  Person educated: Patient Education method: Explanation Education comprehension: verbalized understanding  HOME EXERCISE PROGRAM: Access Code: [email or web address] URL: [email or web address] Date: 03/18/2023 Prepared by: Deward Ming  Exercises - Supine March with Resistance Band  - 1-2 x daily - 3 sets - 10 reps - Hooklying Clamshell with Resistance  - 1-2 x daily - 3 sets - 10 reps  03/21/23 - Seated Correct Posture  - 2 x daily - 7 x weekly - 1 sets - 10 reps - 3-5: hold - Seated Scapular Retraction  - 2 x daily - 7 x  weekly - 1 sets - 10 reps - 3-5 hold - Supine Bridge with Resistance Band  - 1-2 x daily - 7 x weekly - 1 sets - 10 reps - 3-5 hold - Supine March  - 1-2 x daily - 7 x weekly - 1 sets - 10 reps - 3-5 hold  Access Code: [email or web address] URL: [email or web address] Date: 03/25/2023 Prepared by: Augustin Mclean  Exercises - Clam with Resistance  - 1-2 x daily - 7 x weekly - 1 sets - 10 reps - Supine Quad Set  - 2 x daily - 7 x weekly - 1 sets - 10 reps - Active Straight Leg Raise with Quad Set  - 1 x daily - 7 x weekly - 3 sets - 10 reps - Hooklying Single Knee to Chest  - 1-2 x daily - 7 x weekly - 1 sets - 3 reps - 20 hold  ASSESSMENT:  CLINICAL IMPRESSION:  Began closed chain exercises for core stability and LE strengthening.  Pt limited by Rt knee pain, monitored through all exercises this session.  Cueing required for stability with new exercises, tolerated well though does show fatigue due to core and LE weakness.  EOS pt reports LBP reduced, continues to have knee pain with weight bearing.  Reviewed benefits of walking with AD to assist with pain and improve gait mechanics.   Therapist reviewed goals and evaluation with pt.  Completed mm testing for gluteal mm as well as 2 MWT.  Pt needs cuing to complete exercises with proper stabilization but able to show good form after cuing.  presents to PT with the following objective impairments: Abnormal gait, decreased activity tolerance, difficulty walking, decreased strength, increased muscle spasms, improper body mechanics, postural dysfunction, and pain. These impairments limit the patient in activities such as carrying, lifting, bending, sitting, standing, squatting, stairs, locomotion level, and caring for others. These impairments also limit the patient in participation such as meal prep, cleaning, laundry,  shopping, community activity, and yard work. The patient will benefit from PT to address the limitations/impairments listed below  to return to their prior level of function in the domains of activity and participation.    REHAB POTENTIAL: Good  CLINICAL DECISION MAKING: Stable/uncomplicated  EVALUATION COMPLEXITY: Low     GOALS: Goals reviewed with patient? No  SHORT TERM GOALS: Target date: 2 weeks  1. Patient will be independent with a basic stretching/strengthening HEP  Baseline:  Goal status: on-going    LONG TERM GOALS: Target date: 4 weeks  Patient will walk >/= 200 feet on the with 1-2/10 pain in ADL completion, home/community ambulation, and lifting/bending/squatting. Baseline:  Goal status: on-going   2.  Patient will complete the 5 times sit to stand:    within 16s to demonstrate an improvement in ADL completion, stair negotiation, household/community ambulation, and self-care Baseline:  Goal status: on-going   3.  Patient will be independent with a comprehensive strengthening HEP  Baseline:  Goal status: on-going     PLAN:  PT FREQUENCY: 2x/week  PT DURATION: 4 weeks  PLANNED INTERVENTIONS: 97110-Therapeutic exercises, 97530- Therapeutic activity, 97112- Neuromuscular re-education, 97535- Self Care, [id number]- Manual therapy, [id number]- Gait training, 97014- Electrical stimulation (unattended), [phone]- Electrical stimulation (manual), Patient/Family education, Balance training, Stair training, Taping, Dry Needling, Joint mobilization, Joint manipulation, Spinal manipulation, Cryotherapy, and Moist heat.  PLAN FOR NEXT SESSION: Progress to standing stabilization.   Augustin Mclean, LPTA/CLT; [facility] [phone]  03/28/2023, 10:17 AM

## 2023-04-01 ENCOUNTER — Telehealth (HOSPITAL_COMMUNITY): Payer: Self-pay | Admitting: *Deleted

## 2023-04-01 ENCOUNTER — Encounter (HOSPITAL_COMMUNITY): Payer: Medicare Other

## 2023-04-01 ENCOUNTER — Other Ambulatory Visit (HOSPITAL_COMMUNITY): Payer: Self-pay | Admitting: *Deleted

## 2023-04-01 ENCOUNTER — Telehealth: Payer: Self-pay | Admitting: Pharmacy Technician

## 2023-04-01 MED ORDER — IVABRADINE HCL 7.5 MG PO TABS: ORAL_TABLET | ORAL | 0 refills | Status: DC

## 2023-04-01 NOTE — Telephone Encounter (Signed)
 Reaching out to patient to offer assistance regarding upcoming cardiac imaging study; pt verbalizes understanding of appt date/time, parking situation and where to check in, pre-test NPO status and medications ordered, and verified current allergies; name and call back number provided for further questions should they arise  Chase Copping RN Navigator Cardiac Imaging [facility] [phone] office [phone] cell  Patient to take 15mg  ivabradine  two hours prior to her cardiac CT scan. She is aware to arrive at 1 PM.

## 2023-04-01 NOTE — Telephone Encounter (Signed)
 Got a fax that colanor needed a prior auth. I called the pharmacy and had them to run it as cash since insurance wont do a pa for 2 tablets. Cost is 26.80

## 2023-04-02 ENCOUNTER — Ambulatory Visit (HOSPITAL_COMMUNITY)
Admission: RE | Admit: 2023-04-02 | Discharge: 2023-04-02 | Disposition: A | Discharge status: Home / Self Care | Payer: Medicare Other | Source: Ambulatory Visit | Attending: Student | Admitting: Student

## 2023-04-02 DIAGNOSIS — I2510 Atherosclerotic heart disease of native coronary artery without angina pectoris: Secondary | ICD-10-CM | POA: Diagnosis not present

## 2023-04-02 DIAGNOSIS — R079 Chest pain, unspecified: Secondary | ICD-10-CM | POA: Insufficient documentation

## 2023-04-02 DIAGNOSIS — R0609 Other forms of dyspnea: Secondary | ICD-10-CM | POA: Insufficient documentation

## 2023-04-02 MED ORDER — IOHEXOL 350 MG/ML SOLN
95.0000 mL | Freq: Once | INTRAVENOUS | Status: AC | PRN
  Administered 2023-04-02: 95 mL via INTRAVENOUS

## 2023-04-02 MED ORDER — NITROGLYCERIN 0.4 MG SL SUBL
SUBLINGUAL_TABLET | SUBLINGUAL | Status: AC
  Filled 2023-04-02: qty 2

## 2023-04-02 MED ORDER — DILTIAZEM HCL 25 MG/5ML IV SOLN: 10.0000 mg | INTRAVENOUS | Status: DC | PRN

## 2023-04-02 MED ORDER — NITROGLYCERIN 0.4 MG SL SUBL
0.8000 mg | SUBLINGUAL_TABLET | Freq: Once | SUBLINGUAL | Status: AC
  Administered 2023-04-02: 0.8 mg via SUBLINGUAL

## 2023-04-02 MED ORDER — METOPROLOL TARTRATE 5 MG/5ML IV SOLN: 10.0000 mg | Freq: Once | INTRAVENOUS | Status: DC | PRN

## 2023-04-03 ENCOUNTER — Encounter (HOSPITAL_COMMUNITY): Payer: Medicare Other

## 2023-04-04 NOTE — Progress Notes (Signed)
COVID Vaccine Completed: yes  Date of COVID positive in last 90 days:  PCP - Assunta Found, MD Cardiologist - Lance Muss, MD Oncologist- Si Gaul, MD  Oncology clearance in media tab 03/11/23  Chest x-ray - 01/21/23 Epic EKG - 01/21/23 Epic tachy at 107 Stress Test -  ECHO - 07/18/21 Epic Cardiac Cath -  Pacemaker/ICD device last checked: Spinal Cord Stimulator:  Bowel Prep -   Sleep Study -  CPAP -   Fasting Blood Sugar -  Checks Blood Sugar _____ times a day  Last dose of GLP1 agonist-  N/A GLP1 instructions:  Hold 7 days before surgery    Last dose of SGLT-2 inhibitors-  N/A SGLT-2 instructions:  Hold 3 days before surgery    Blood Thinner Instructions:  Last dose:   Time: Aspirin Instructions: Last Dose:  Activity level:  Can go up a flight of stairs and perform activities of daily living without stopping and without symptoms of chest pain or shortness of breath.  Able to exercise without symptoms  Unable to go up a flight of stairs without symptoms of     Anesthesia review: lung cancer, HTN, asthma, needs cardiac clearance   Patient denies shortness of breath, fever, cough and chest pain at PAT appointment  Patient verbalized understanding of instructions that were given to them at the PAT appointment. Patient was also instructed that they will need to review over the PAT instructions again at home before surgery.

## 2023-04-05 ENCOUNTER — Encounter (HOSPITAL_COMMUNITY): Payer: Self-pay

## 2023-04-05 ENCOUNTER — Ambulatory Visit (HOSPITAL_COMMUNITY): Payer: Medicare Other | Admitting: Physical Therapy

## 2023-04-05 ENCOUNTER — Encounter (HOSPITAL_COMMUNITY)
Admission: RE | Admit: 2023-04-05 | Discharge: 2023-04-05 | Disposition: A | Discharge status: Home / Self Care | Payer: Medicare Other | Source: Ambulatory Visit | Attending: Orthopedic Surgery | Admitting: Orthopedic Surgery

## 2023-04-05 ENCOUNTER — Other Ambulatory Visit: Payer: Self-pay

## 2023-04-05 VITALS — BP 111/79 | HR 96 | Temp 97.6°F | Resp 18 | Ht 64.0 in | Wt 160.0 lb

## 2023-04-05 DIAGNOSIS — M25561 Pain in right knee: Secondary | ICD-10-CM | POA: Diagnosis not present

## 2023-04-05 DIAGNOSIS — Z01818 Encounter for other preprocedural examination: Secondary | ICD-10-CM

## 2023-04-05 DIAGNOSIS — Z01812 Encounter for preprocedural laboratory examination: Secondary | ICD-10-CM | POA: Insufficient documentation

## 2023-04-05 DIAGNOSIS — R262 Difficulty in walking, not elsewhere classified: Secondary | ICD-10-CM

## 2023-04-05 DIAGNOSIS — M6281 Muscle weakness (generalized): Secondary | ICD-10-CM

## 2023-04-05 DIAGNOSIS — M5459 Other low back pain: Secondary | ICD-10-CM | POA: Diagnosis not present

## 2023-04-05 HISTORY — DX: Pneumonia, unspecified organism: J18.9

## 2023-04-05 NOTE — Patient Instructions (Addendum)
SURGICAL WAITING ROOM VISITATION  Patients having surgery or a procedure may have no more than 2 support people in the waiting area - these visitors may rotate.    Children under the age of 64 must have an adult with them who is not the patient.  Due to an increase in RSV and influenza rates and associated hospitalizations, children ages 40 and under may not visit patients in [facility] hospitals.  Visitors with respiratory illnesses are discouraged from visiting and should remain at home.  If the patient needs to stay at the hospital during part of their recovery, the visitor guidelines for inpatient rooms apply. Pre-op nurse will coordinate an appropriate time for 1 support person to accompany patient in pre-op.  This support person may not rotate.    Please refer to the [facility] website for the visitor guidelines for Inpatients (after your surgery is over and you are in a regular room).    Your procedure is scheduled on: 04/15/22   Report to [facility] Main Entrance    Report to admitting at 7:35 AM   Call this number if you have problems the morning of surgery [phone]   Do not eat food :After Midnight.   After Midnight you may have the following liquids until 7:05 AM DAY OF SURGERY  Water Non-Citrus Juices (without pulp, NO RED-Apple, White grape, White cranberry) Black Coffee (NO MILK/CREAM OR CREAMERS, sugar ok)  Clear Tea (NO MILK/CREAM OR CREAMERS, sugar ok) regular and decaf                             Plain Jell-O (NO RED)                                           Fruit ices (not with fruit pulp, NO RED)                                     Popsicles (NO RED)                                                               Sports drinks like Gatorade (NO RED)                 The day of surgery:  Drink ONE (1) Pre-Surgery Clear Ensure at 7:05 AM the morning of surgery. Drink in one sitting. Do not sip.  This drink was given to you during your hospital   pre-op appointment visit. Nothing else to drink after completing the  Pre-Surgery Clear Ensure.          If you have questions, please contact your surgeon's office.   FOLLOW BOWEL PREP AND ANY ADDITIONAL PRE OP INSTRUCTIONS YOU RECEIVED FROM YOUR SURGEON'S OFFICE!!!     Oral Hygiene is also important to reduce your risk of infection.                                    Remember -  BRUSH YOUR TEETH THE MORNING OF SURGERY WITH YOUR REGULAR TOOTHPASTE  DENTURES WILL BE REMOVED PRIOR TO SURGERY PLEASE DO NOT APPLY "Poly grip" OR ADHESIVES!!!   Do NOT smoke after Midnight   Stop all vitamins and herbal supplements 7 days before surgery.   Take these medicines the morning of surgery with A SIP OF WATER: Inhalers, Allegra, Flonase, Levothyroxine, Metoprolol, Rosuvastatin, Tagrisso              You may not have any metal on your body including hair pins, jewelry, and body piercing             Do not wear make-up, lotions, powders, perfumes, or deodorant  Do not wear nail polish including gel and S&S, artificial/acrylic nails, or any other type of covering on natural nails including finger and toenails. If you have artificial nails, gel coating, etc. that needs to be removed by a nail salon please have this removed prior to surgery or surgery may need to be canceled/ delayed if the surgeon/ anesthesia feels like they are unable to be safely monitored.   Do not shave  48 hours prior to surgery.    Do not bring valuables to the hospital. [facility] IS NOT             RESPONSIBLE   FOR VALUABLES.   Contacts, glasses, dentures or bridgework may not be worn into surgery.   Bring small overnight bag day of surgery.   DO NOT BRING YOUR HOME MEDICATIONS TO THE HOSPITAL. PHARMACY WILL DISPENSE MEDICATIONS LISTED ON YOUR MEDICATION LIST TO YOU DURING YOUR ADMISSION IN THE HOSPITAL!   Special Instructions: Bring a copy of your healthcare power of attorney and living will documents the day of  surgery if you haven't scanned them before.              Please read over the following fact sheets you were given: IF YOU HAVE QUESTIONS ABOUT YOUR PRE-OP INSTRUCTIONS PLEASE CALL [phone] [place]ras   If you received a COVID test during your pre-op visit  it is requested that you wear a mask when out in public, stay away from anyone that may not be feeling well and notify your surgeon if you develop symptoms. If you test positive for Covid or have been in contact with anyone that has tested positive in the last 10 days please notify you surgeon.      Pre-operative 5 CHG Bath Instructions   You can play a key role in reducing the risk of infection after surgery. Your skin needs to be as free of germs as possible. You can reduce the number of germs on your skin by washing with CHG (chlorhexidine gluconate) soap before surgery. CHG is an antiseptic soap that kills germs and continues to kill germs even after washing.   DO NOT use if you have an allergy to chlorhexidine/CHG or antibacterial soaps. If your skin becomes reddened or irritated, stop using the CHG and notify one of our RNs at [phone].   Please shower with the CHG soap starting 4 days before surgery using the following schedule:     Please keep in mind the following:  DO NOT shave, including legs and underarms, starting the day of your first shower.   You may shave your face at any point before/day of surgery.  Place clean sheets on your bed the day you start using CHG soap. Use a clean washcloth (not used since being washed) for each shower. DO NOT  sleep with pets once you start using the CHG.   CHG Shower Instructions:  If you choose to wash your hair and private area, wash first with your normal shampoo/soap.  After you use shampoo/soap, rinse your hair and body thoroughly to remove shampoo/soap residue.  Turn the water OFF and apply about 3 tablespoons (45 ml) of CHG soap to a CLEAN washcloth.  Apply CHG soap ONLY  FROM YOUR NECK DOWN TO YOUR TOES (washing for 3-5 minutes)  DO NOT use CHG soap on face, private areas, open wounds, or sores.  Pay special attention to the area where your surgery is being performed.  If you are having back surgery, having someone wash your back for you may be helpful. Wait 2 minutes after CHG soap is applied, then you may rinse off the CHG soap.  Pat dry with a clean towel  Put on clean clothes/pajamas   If you choose to wear lotion, please use ONLY the CHG-compatible lotions on the back of this paper.     Additional instructions for the day of surgery: DO NOT APPLY any lotions, deodorants, cologne, or perfumes.   Put on clean/comfortable clothes.  Brush your teeth.  Ask your nurse before applying any prescription medications to the skin.      CHG Compatible Lotions   Aveeno Moisturizing lotion  Cetaphil Moisturizing Cream  Cetaphil Moisturizing Lotion  Clairol Herbal Essence Moisturizing Lotion, Dry Skin  Clairol Herbal Essence Moisturizing Lotion, Extra Dry Skin  Clairol Herbal Essence Moisturizing Lotion, Normal Skin  Curel Age Defying Therapeutic Moisturizing Lotion with Alpha Hydroxy  Curel Extreme Care Body Lotion  Curel Soothing Hands Moisturizing Hand Lotion  Curel Therapeutic Moisturizing Cream, Fragrance-Free  Curel Therapeutic Moisturizing Lotion, Fragrance-Free  Curel Therapeutic Moisturizing Lotion, Original Formula  Eucerin Daily Replenishing Lotion  Eucerin Dry Skin Therapy Plus Alpha Hydroxy Crme  Eucerin Dry Skin Therapy Plus Alpha Hydroxy Lotion  Eucerin Original Crme  Eucerin Original Lotion  Eucerin Plus Crme Eucerin Plus Lotion  Eucerin TriLipid Replenishing Lotion  Keri Anti-Bacterial Hand Lotion  Keri Deep Conditioning Original Lotion Dry Skin Formula Softly Scented  Keri Deep Conditioning Original Lotion, Fragrance Free Sensitive Skin Formula  Keri Lotion Fast Absorbing Fragrance Free Sensitive Skin Formula  Keri Lotion Fast  Absorbing Softly Scented Dry Skin Formula  Keri Original Lotion  Keri Skin Renewal Lotion Keri Silky Smooth Lotion  Keri Silky Smooth Sensitive Skin Lotion  Nivea Body Creamy Conditioning Oil  Nivea Body Extra Enriched Lotion  Nivea Body Original Lotion  Nivea Body Sheer Moisturizing Lotion Nivea Crme  Nivea Skin Firming Lotion  NutraDerm 30 Skin Lotion  NutraDerm Skin Lotion  NutraDerm Therapeutic Skin Cream  NutraDerm Therapeutic Skin Lotion  ProShield Protective Hand Cream  Provon moisturizing lotion View Pre-Surgery Education Videos:  [email or web address]     Incentive Spirometer  An incentive spirometer is a tool that can help keep your lungs clear and active. This tool measures how well you are filling your lungs with each breath. Taking long deep breaths may help reverse or decrease the chance of developing breathing (pulmonary) problems (especially infection) following: A long period of time when you are unable to move or be active. BEFORE THE PROCEDURE  If the spirometer includes an indicator to show your best effort, your nurse or respiratory therapist will set it to a desired goal. If possible, sit up straight or lean slightly forward. Try not to slouch. Hold the incentive spirometer in an upright position. INSTRUCTIONS FOR USE  Sit  on the edge of your bed if possible, or sit up as far as you can in bed or on a chair. Hold the incentive spirometer in an upright position. Breathe out normally. Place the mouthpiece in your mouth and seal your lips tightly around it. Breathe in slowly and as deeply as possible, raising the piston or the ball toward the top of the column. Hold your breath for 3-5 seconds or for as long as possible. Allow the piston or ball to fall to the bottom of the column. Remove the mouthpiece from your mouth and breathe out normally. Rest for a few seconds and repeat Steps 1 through 7 at least  10 times every 1-2 hours when you are awake. Take your time and take a few normal breaths between deep breaths. The spirometer may include an indicator to show your best effort. Use the indicator as a goal to work toward during each repetition. After each set of 10 deep breaths, practice coughing to be sure your lungs are clear. If you have an incision (the cut made at the time of surgery), support your incision when coughing by placing a pillow or rolled up towels firmly against it. Once you are able to get out of bed, walk around indoors and cough well. You may stop using the incentive spirometer when instructed by your caregiver.  RISKS AND COMPLICATIONS Take your time so you do not get dizzy or light-headed. If you are in pain, you may need to take or ask for pain medication before doing incentive spirometry. It is harder to take a deep breath if you are having pain. AFTER USE Rest and breathe slowly and easily. It can be helpful to keep track of a log of your progress. Your caregiver can provide you with a simple table to help with this. If you are using the spirometer at home, follow these instructions: SEEK MEDICAL CARE IF:  You are having difficultly using the spirometer. You have trouble using the spirometer as often as instructed. Your pain medication is not giving enough relief while using the spirometer. You develop fever of 100.5 F (38.1 C) or higher. SEEK IMMEDIATE MEDICAL CARE IF:  You cough up bloody sputum that had not been present before. You develop fever of 102 F (38.9 C) or greater. You develop worsening pain at or near the incision site. MAKE SURE YOU:  Understand these instructions. Will watch your condition. Will get help right away if you are not doing well or get worse. Document Released: 06/18/2006 Document Revised: 04/30/2011 Document Reviewed: 08/19/2006 [facility] Patient Information 2014 [place],  [place].   ________________________________________________________________________

## 2023-04-05 NOTE — Therapy (Signed)
[facility] OUTPATIENT PHYSICAL THERAPY Treatment     Patient Name: Linda Woods MRN: [id number] DOB:27-May-1950, 73 y.o., female Today's Date: 04/05/2023      END OF SESSION:   PT End of Session - 04/05/23 0838     Visit Number 5    Number of Visits 8    Date for PT Re-Evaluation 04/15/23    Authorization Type [employer] Medicare    Authorization Time Period no auth    PT Start Time 0802    PT Stop Time 0840    PT Time Calculation (min) 38 min    Activity Tolerance Patient tolerated treatment well;Patient limited by pain;No increased pain    Behavior During Therapy WFL for tasks assessed/performed                Past Medical History:  Diagnosis Date   Anemia    as a child   Anxiety    Asthma 10/19/2020   Bursitis    Chronic reflux esophagitis    Complication of anesthesia    Diarrhea, functional    Diverticulosis    GERD (gastroesophageal reflux disease)    History of kidney stones    Hypertension    Knee pain    right knee-seeing ortho   lung ca 09/2019   Osteoarthritis    Plantar fasciitis    PONV (postoperative nausea and vomiting)    has used the patch before and that helps   Pure hypercholesterolemia    RLS (restless legs syndrome)    Superficial vein thrombosis    Thyroid disease    hypothyroid   Past Surgical History:  Procedure Laterality Date   APPENDECTOMY     BRONCHIAL BIOPSY  10/20/2019   Procedure: BRONCHIAL BIOPSIES;  Surgeon: Leslye Peer, MD;  Location: [facility] ENDOSCOPY;  Service: Pulmonary;;   BRONCHIAL BRUSHINGS  10/20/2019   Procedure: BRONCHIAL BRUSHINGS;  Surgeon: Leslye Peer, MD;  Location: [facility] ENDOSCOPY;  Service: Pulmonary;;   BRONCHIAL NEEDLE ASPIRATION BIOPSY  10/20/2019   Procedure: BRONCHIAL NEEDLE ASPIRATION BIOPSIES;  Surgeon: Leslye Peer, MD;  Location: [facility] ENDOSCOPY;  Service: Pulmonary;;   BRONCHIAL WASHINGS  10/20/2019   Procedure: BRONCHIAL WASHINGS;  Surgeon: Leslye Peer, MD;  Location: [facility] ENDOSCOPY;  Service:  Pulmonary;;   CARPAL TUNNEL RELEASE Right 2011   Dr. Amanda Pea   COLPORRHAPHY     posterior   HAND SURGERY Right 2016   Nerve surgery, Dr. Amanda Pea   OVARIAN CYST REMOVAL     RECTOCELE REPAIR  2011   w/TVH and sling   TONSILLECTOMY     TONSILLECTOMY     TOTAL KNEE ARTHROPLASTY Left 09/09/2018   Procedure: TOTAL KNEE ARTHROPLASTY, CORTISONE INJECTION RIGHT KNEE;  Surgeon: Durene Romans, MD;  Location: [facility] ORS;  Service: Orthopedics;  Laterality: Left;  70 mins   TOTAL VAGINAL HYSTERECTOMY  10/18/2009   rectocele repair, sling   TUBAL LIGATION Bilateral    VARICOSE VEIN SURGERY     VIDEO BRONCHOSCOPY WITH ENDOBRONCHIAL NAVIGATION N/A 10/20/2019   Procedure: VIDEO BRONCHOSCOPY WITH ENDOBRONCHIAL NAVIGATION;  Surgeon: Leslye Peer, MD;  Location: [facility] ENDOSCOPY;  Service: Pulmonary;  Laterality: N/A;   Patient Active Problem List   Diagnosis Date Noted   Allergic rhinitis 10/19/2020   Asthma 10/19/2020   Cough 08/09/2020   Drug-induced diarrhea 02/22/2020   Adenocarcinoma of left lung, stage 3 (HCC) 11/03/2019   Encounter for antineoplastic chemotherapy 11/03/2019   Goals of care, counseling/discussion 11/03/2019   Pneumothorax, left 10/20/2019  Mass of left lung 10/16/2019   History of adenomatous polyp of colon 09/29/2018   Diverticulosis 09/29/2018   Internal hemorrhoids 09/29/2018   S/P left TKA 09/09/2018   Status post total left knee replacement 09/09/2018   Pain of left heel 07/03/2017   Pain in joint of right hip 05/17/2017   Benign essential hypertension 11/17/2015   Abnormal weight gain 11/17/2015   Obesity, Class I, BMI 30-34.9 11/17/2015   Gastroesophageal reflux disease without esophagitis 10/12/2014   Hypertriglyceridemia 10/12/2014   Hypertensive disorder 05/21/2013   Hypothyroidism 05/21/2013   Vitamin D deficiency 05/21/2013    PCP: Assunta Found, MDRef Provider (PCP)   REFERRING PROVIDER:   Pincus Badder, FNP    REFERRING DIAG:  Free Text  Diagnosis  LBP    Rationale for Evaluation and Treatment: Rehabilitation  THERAPY DIAG:  Difficulty in walking, not elsewhere classified  Muscle weakness (generalized)  Right knee pain, unspecified chronicity  Other low back pain  ONSET DATE: January 16, 2023    SUBJECTIVE:                                                                                                                                                                                           SUBJECTIVE STATEMENT:  Pt states that she has not been doing well with her exercises. Pain is an 8/10    Patient is scheduled for right TKR on 04/16/23    PERTINENT HISTORY:  Terminal lung cancer for 3 years +; controlled with medicine TOTAL KNEE ARTHROPLASTY Left 09/09/2018   Asthma     PAIN:  Are you having pain? Yes: NPRS scale: 8/10 Pain location: across lumbar  Pain description: constant Aggravating factors: bending, ADLS, lifting  Relieving factors: lidocaine patch  PRECAUTIONS: None  RED FLAGS: None   WEIGHT BEARING RESTRICTIONS: No  FALLS:  Has patient fallen in last 6 months? 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  No acute osseous abnormalities.    SCREENING FOR RED FLAGS: Bowel or bladder incontinence: No Spinal tumors: No Cauda equina syndrome: No Compression fracture: No Abdominal aneurysm: No  COGNITION: Overall cognitive status: Within functional limits for tasks assessed  POSTURE: decreased lumbar lordosis, increased thoracic kyphosis, and flexed  trunk        FUNCTIONAL TESTS:  5 times sit to stand: 20.94s with no upper extremity assist, PT stand-by assist   20-29 years: 6.0  1.4 seconds 30-39 years: 6.1  1.4 seconds 40-49 years: 7.6  1.8 seconds 50-59 years: 7.7  2.6 seconds 60-69 years: 8.4  0.0 seconds (female), 12.7  1.8 seconds (female) 70-79 years: 11.6  3.4 seconds (female), 13.0  4.8 seconds (female) 80-89 years: 16.7  4.5 seconds (female), 17.2  5.5 seconds (female) 90+ years: 19.5  2.3 seconds (female), 22.9  9.6 seconds (female)  The Minimal Clinically Important Difference (MCID) is 2.3 seconds.  A score of 16 seconds or less indicates that the participant is not likely to fall.  A score of more than 16 seconds indicates a higher risk of falls  As per American Physical Therapy Association (APTA) website   2 minute walk test: next session    GAIT ANALYSIS:  Distance walked: 70ft Assistive device utilized: None Level of assistance: Complete Independence Comments: moderate antalgia right lower extremity; decreased cadence/step length   SENSATION: WFL    LOWER EXTREMITY MMT:    MMT Right eval Right  Left eval Left   Hip flexion 3/5  3/5   Hip extension  3+  3  Hip abduction 3+/5  3+/5   Hip adduction      Hip internal rotation      Hip external rotation      Knee flexion 3+/5  3+/5   Knee extension 4-/5  4-/5   Ankle dorsiflexion      Ankle plantarflexion      Ankle inversion      Ankle eversion       (Blank rows = not tested)  LOWER EXTREMITY ROM:     Active  Right eval Left eval  Hip flexion WFL   Hip extension    Hip abduction    Hip adduction    Hip internal rotation [facility]  Hip external rotation [facility]  Knee flexion 0-120   Knee extension WFL   Ankle dorsiflexion    Ankle plantarflexion    Ankle inversion    Ankle eversion     (Blank rows = not tested)  LOWER EXTREMITY SPECIAL TESTS  Luisa Hart (FABER) test: positive on right lower extremity    PALPATION: Moderate  tenderness to palpation bilateral lumbar paraspinals  INTEGUMENTARY   [facility]  03/21/23 326 ft walk in 2 minutes pain in knee that she is to get a TKR  5/10   TODAY'S TREATMENT:                                                                                                                              DATE:  04/05/23 Theraband postural exercises: Green Scapular retraction x 10 Rows x 10 Shoulder extension x  10  Wall arch x 10  Squat x 5 Side step x  Rt down long line had to  stop due to knee pain; pt is having TKR in two weeks  Supine:  Dead bug x10 Knee to chest 30" x 3 Bed mobility 03/28/23: Standing: Marching 10x 3" with ab set Heel raise 10x Toe raise 10x Abduction 10x  RTB rows10x RTB shoulder extension  Supine: Bridge with GTB 10x 5" Clam with ab set GTB 10x 5" each Marching with GTB around thigh with ab set 10x 5" SLR with ab set and quad set prior raise 10x SKTC 2x 30" Hamstring stretch 2x 30"  Sidelying: Abduction 10x  03/25/23: Gait training with SPC x226 with cueing for sequence, heel to toe and equal step length  Supine: Resisted bridge 10x 5" with GTB around thigh Resisted marching with ab set 10x5" SKTC 3x 20" Quad sets 10x 5" SLR with core and quad set prior raise 10x  Sidelying:  Clam with GTB  STS no HHA eccentric control Wback 10x 5" Rows with GTB 10x  03/21/23 Wall arch x 10 Seated: Tall posture x 10 Scapular retraction x 10 Supine: Resisted clam x 10 Each Resisted Marching x 10 Resisted Bridge x 10 Bent knee lift x 10     03/18/23 PT I.E. HEP-see below   PATIENT EDUCATION:  Education details: HEP as well as proper bed mobility.   Person educated: Patient Education method: Explanation Education comprehension: verbalized understanding  HOME EXERCISE PROGRAM: Access Code: [email or web address] URL: [email or web address] Date: 03/18/2023 Prepared by: Seymour Bars  Exercises - Supine March with Resistance Band  - 1-2 x daily -  3 sets - 10 reps - Hooklying Clamshell with Resistance  - 1-2 x daily - 3 sets - 10 reps  03/21/23 - Seated Correct Posture  - 2 x daily - 7 x weekly - 1 sets - 10 reps - 3-5: hold - Seated Scapular Retraction  - 2 x daily - 7 x weekly - 1 sets - 10 reps - 3-5" hold - Supine Bridge with Resistance Band  - 1-2 x daily - 7 x weekly - 1 sets - 10 reps - 3-5 hold - Supine March  - 1-2 x daily - 7 x weekly - 1 sets - 10 reps - 3-5 hold  Access Code: [email or web address] URL: [email or web address] Date: 03/25/2023 Prepared by: Becky Sax  Exercises - Clam with Resistance  - 1-2 x daily - 7 x weekly - 1 sets - 10 reps - Supine Quad Set  - 2 x daily - 7 x weekly - 1 sets - 10 reps - Active Straight Leg Raise with Quad Set  - 1 x daily - 7 x weekly - 3 sets - 10 reps - Hooklying Single Knee to Chest  - 1-2 x daily - 7 x weekly - 1 sets - 3 reps - 20" hold  ASSESSMENT:  CLINICAL IMPRESSION:  Added postural exercises to HEP. Continued to attempt to progress WB exercises, however, Pt is having increased knee pain with all standing activity.  PT continues to have significant increased back and knee pain. PT with the following objective impairments: Abnormal gait, decreased activity tolerance, difficulty walking, decreased strength, increased muscle spasms, improper body mechanics, postural dysfunction, and pain. These impairments limit the patient in activities such as carrying, lifting, bending, sitting, standing, squatting, stairs, locomotion level, and caring for others. These impairments also limit the patient in participation such as meal prep, cleaning, laundry, shopping, community activity, and yard work. The patient will benefit from PT to address the limitations/impairments listed below to return to their prior level of function  in the domains of activity and participation.   Therapist reviewed goals and evaluation with pt.  Completed mm testing for gluteal mm as well as 2 MWT.  Pt needs  cuing to complete exercises with proper stabilization but able to show good form after cuing.  presents to PT with the following objective impairments: Abnormal gait, decreased activity tolerance, difficulty walking, decreased strength, increased muscle spasms, improper body mechanics, postural dysfunction, and pain. These impairments limit the patient in activities such as carrying, lifting, bending, sitting, standing, squatting, stairs, locomotion level, and caring for others. These impairments also limit the patient in participation such as meal prep, cleaning, laundry, shopping, community activity, and yard work. The patient will benefit from PT to address the limitations/impairments listed below to return to their prior level of function in the domains of activity and participation.    REHAB POTENTIAL: Good  CLINICAL DECISION MAKING: Stable/uncomplicated  EVALUATION COMPLEXITY: Low     GOALS: Goals reviewed with patient? No  SHORT TERM GOALS: Target date: 2 weeks  1. Patient will be independent with a basic stretching/strengthening HEP  Baseline:  Goal status: on-going    LONG TERM GOALS: Target date: 4 weeks  Patient will walk >/= 200 feet on the with 1-2/10 pain in ADL completion, home/community ambulation, and lifting/bending/squatting. Baseline:  Goal status: on-going   2.  Patient will complete the 5 times sit to stand:    within 16s to demonstrate an improvement in ADL completion, stair negotiation, household/community ambulation, and self-care Baseline:  Goal status: on-going   3.  Patient will be independent with a comprehensive strengthening HEP  Baseline:  Goal status: on-going     PLAN:  PT FREQUENCY: 2x/week  PT DURATION: 4 weeks  PLANNED INTERVENTIONS: 97110-Therapeutic exercises, 97530- Therapeutic activity, 97112- Neuromuscular re-education, 97535- Self Care, 44034- Manual therapy, [id number]- Gait training, 97014- Electrical stimulation (unattended),  [phone]- Electrical stimulation (manual), Patient/Family education, Balance training, Stair training, Taping, Dry Needling, Joint mobilization, Joint manipulation, Spinal manipulation, Cryotherapy, and Moist heat.  PLAN FOR NEXT SESSION: Progress to standing stabilization.   Virgina Organ, PT CLT [phone]  04/05/2023, 8:42 AM

## 2023-04-05 NOTE — Telephone Encounter (Signed)
   Patient Name: Kamalei Roeder  DOB: 07/06/1950 MRN: [id number]  Primary Cardiologist: Lance Muss, MD  Chart reviewed as part of pre-operative protocol coverage. Given past medical history and time since last visit, based on ACC/AHA guidelines, Kimorah Ridolfi is at acceptable risk for the planned procedure without further cardiovascular testing.   Patient completed coronary CTA that showed minimal to mild plaque and is cleared to proceed with scheduled procedure.   The patient was advised that if she develops new symptoms prior to surgery to contact our office to arrange for a follow-up visit, and she verbalized understanding.  I will route this recommendation to the requesting party via Epic fax function and remove from pre-op pool.  Please call with questions.  Napoleon Form, Leodis Rains, NP 04/05/2023, 8:24 AM

## 2023-04-08 LAB — SURGICAL PCR SCREEN
MRSA, PCR: POSITIVE — AB
Staphylococcus aureus: POSITIVE — AB

## 2023-04-08 NOTE — Progress Notes (Signed)
STAPH and MRSA+, results routed to Dr. Charlann Boxer.

## 2023-04-09 ENCOUNTER — Encounter: Payer: Self-pay | Admitting: Primary Care

## 2023-04-09 ENCOUNTER — Ambulatory Visit: Payer: Medicare Other | Admitting: Primary Care

## 2023-04-09 VITALS — BP 124/72 | HR 91 | Temp 97.8°F | Ht 64.0 in | Wt 162.6 lb

## 2023-04-09 DIAGNOSIS — M1711 Unilateral primary osteoarthritis, right knee: Secondary | ICD-10-CM | POA: Diagnosis not present

## 2023-04-09 DIAGNOSIS — C3492 Malignant neoplasm of unspecified part of left bronchus or lung: Secondary | ICD-10-CM | POA: Diagnosis not present

## 2023-04-09 DIAGNOSIS — M545 Low back pain, unspecified: Secondary | ICD-10-CM | POA: Diagnosis not present

## 2023-04-09 DIAGNOSIS — J4520 Mild intermittent asthma, uncomplicated: Secondary | ICD-10-CM

## 2023-04-09 DIAGNOSIS — M25561 Pain in right knee: Secondary | ICD-10-CM | POA: Diagnosis not present

## 2023-04-09 NOTE — Patient Instructions (Signed)
-  ADENOCARCINOMA OF THE LEFT LUNG: Continue following the treatment plan set by Dr. Arbutus Ped, your oncologist.  -OBSTRUCTIVE LUNG DISEASE WITH ASTHMATIC COMPONENT: This condition involves blocked airflow in the lungs, often with an asthmatic component that improves with medication. Use your Stiolto inhaler consistently before and after your surgery to help manage your breathing.  -GASTROESOPHAGEAL REFLUX DISEASE (GERD): GERD is a digestive disorder where stomach acid frequently flows back into the esophagus. Try taking Aciphex every other day and continue with dietary changes to manage your symptoms.  -PREOPERATIVE ASSESSMENT FOR RIGHT KNEE SURGERY: You are at intermediate risk for surgery due to your age and medical history. Use your Stiolto inhaler consistently before and after surgery, and follow the surgical team's recommendations, including early walking, using an incentive spirometer, and wearing compression stockings to prevent complications.  INSTRUCTIONS:  Your right knee surgery is scheduled for 04/16/2023. Please use your Stiolto inhaler consistently before and after the surgery. If you develop any acute respiratory illness, contact us immediately to discuss postponing the surgery. After the surgery, follow the surgical team's recommendations for early ambulation, use of an incentive spirometer, and wearing compression stockings. Check with the surgical team about the potential use of prophylactic heparin during your hospital stay. Schedule a follow-up appointment post-surgery to assess your recovery and lung function.  Follow-up Annually with Dr. Delton Coombes for COPD

## 2023-04-09 NOTE — Progress Notes (Signed)
@Patient  ID: Chauncy Lean, female    DOB: 15-Dec-1950, 73 y.o.   MRN: [id number]  No chief complaint on file.   Referring provider: Assunta Found, MD  HPI: 73 year old female, never smoked. PMH significant for HTN, lung cancer, asthma, GERD, obesity, hiatal hernia, left TKA, hypothyroidism, vit D deficiency, obesity.   04/09/2023 Discussed the use of AI scribe software for clinical note transcription with the patient, who gave verbal consent to proceed.  History of Present Illness   Jina Olenick is a 73 year old female with adenocarcinoma of the left lung and obstructive lung disease who presents for surgical clearance for right knee surgery.   She has a history of stage III non-small cell lung cancer, adenocarcinoma left lung with EGFR mutation, diagnosed in August 2021. She is currently under the care of an oncologist for the management of her lung cancer. She completed concurrent chemoradiation s/p 7 cycles. She tolerated hher course well except for fatigue as well as skin burn and cough. Pet scan in January showed bone activity below threshold possibly due to arthritis but osseous metastases is still of concern. Maintained on Tagrisso 80mg  once daily. Plan is to follow-up in 2 months with lab work, no scan.   She has moderate obstructive lung disease with an asthmatic component, as evidenced by a 12% improvement in lung function post-bronchodilator during a breathing test conducted in 2022. She experiences shortness of breath when climbing stairs and occasional wheezing, which she attributes to scar tissue from radiation. She uses a Stiolto inhaler, though not frequently, and reports that her breathing is generally stable day-to-day. No use supplemental oxygen.  She has a history of gastroesophageal reflux disease (GERD), which she manages with Aciphex, taken primarily at night. She is attempting to reduce her intake of Aciphex by controlling her diet and timing of meals. She  reports a history of a hiatal hernia and has previously undergone an endoscopy, which did not reveal any significant findings.  In terms of her medication, she recently renewed her prescription for the Stiolto inhaler and uses it occasionally, particularly at night when she feels breathless after exertion. She is not on any blood thinners and has no history of deep vein thrombosis or pulmonary embolism.         No Known Allergies  Immunization History  Administered Date(s) Administered   Fluad Quad(high Dose 65+) 11/18/2019   Influenza Nasal 11/25/2020   Influenza, High Dose Seasonal PF 11/18/2018   Moderna Sars-Covid-2 Vaccination 05/08/2019, 06/08/2019, 12/18/2019, 06/17/2020   PFIZER(Purple Top)SARS-COV-2 Vaccination 06/24/2020   Pneumococcal Conjugate-13 01/29/2018   Tdap 04/27/2009   Zoster Recombinant(Shingrix) 08/27/2018    Past Medical History:  Diagnosis Date   Anemia    as a child   Anxiety    Asthma 10/19/2020   Bursitis    Chronic reflux esophagitis    Complication of anesthesia    Diarrhea, functional    Diverticulosis    GERD (gastroesophageal reflux disease)    History of kidney stones    Hypertension    Knee pain    right knee-seeing ortho   lung ca 09/2019   Osteoarthritis    Plantar fasciitis    Pneumonia    PONV (postoperative nausea and vomiting)    has used the patch before and that helps   Pure hypercholesterolemia    RLS (restless legs syndrome)    Superficial vein thrombosis    Thyroid disease    hypothyroid    Tobacco History:  Social History   Tobacco Use  Smoking Status Never  Smokeless Tobacco Never   Counseling given: Not Answered   Outpatient Medications Prior to Visit  Medication Sig Dispense Refill   albuterol (VENTOLIN HFA) 108 (90 Base) MCG/ACT inhaler Inhale 2 puffs into the lungs every 6 (six) hours as needed for wheezing or shortness of breath. 8.5 g 0   azithromycin (ZITHROMAX) 250 MG tablet Take (2) tablets by mouth  on day 1, then take (1) tablet by mouth on days 2-5. (Patient not taking: Reported on 04/05/2023) 6 tablet 0   benzonatate (TESSALON) 100 MG capsule Take 1 capsule (100 mg total) by mouth 3 (three) times daily as needed for cough. Do not take with alcohol or while driving or operating heavy machinery.  May cause drowsiness. (Patient not taking: Reported on 04/05/2023) 21 capsule 0   cholecalciferol (VITAMIN D3) 25 MCG (1000 UNIT) tablet Take 1,000 Units by mouth daily. Pt states she take 2000 units/day     cyclobenzaprine (FLEXERIL) 10 MG tablet Take 10 mg by mouth 2 (two) times daily as needed for muscle spasms.     famotidine (PEPCID) 40 MG tablet Take 40 mg by mouth at bedtime.     ferrous sulfate 325 (65 FE) MG tablet Take 325 mg by mouth daily with breakfast.     fexofenadine (ALLEGRA) 180 MG tablet Take 180 mg by mouth daily as needed for allergies.     fluticasone (FLONASE) 50 MCG/ACT nasal spray Place 1 spray into both nostrils as needed for allergies.     ivabradine (CORLANOR) 7.5 MG TABS tablet Take tablets (15mg ) by mouth TWO hours prior to her cardiac CT scan. (Patient not taking: Reported on 04/05/2023) 2 tablet 0   levothyroxine (SYNTHROID) 100 MCG tablet Take 1 tablet (100 mcg total) by mouth daily. One po qd 90 tablet 0   lidocaine (LIDODERM) 5 % Place 1 patch onto the skin daily.     loperamide (IMODIUM) 2 MG capsule Take 2 mg by mouth every 6 (six) hours as needed for diarrhea or loose stools.     LORazepam (ATIVAN) 0.5 MG tablet Take 0.5 mg by mouth at bedtime.      melatonin 5 MG TABS Take 5 mg by mouth at bedtime as needed (sleep).     metoprolol succinate (TOPROL-XL) 50 MG 24 hr tablet Take 1 tablet (50 mg total) by mouth daily. 90 tablet 3   metoprolol tartrate (LOPRESSOR) 100 MG tablet Take 1 tablet (100 mg total) by mouth as directed. Take 2 hours prior to CT Scan ( Hold Toprol the day of CT Scan ) (Patient not taking: Reported on 04/05/2023) 1 tablet 0   MYRBETRIQ 50 MG TB24  tablet Take 50 mg by mouth daily.     NEOMYCIN-POLYMYXIN-HYDROCORTISONE (CORTISPORIN) 1 % SOLN OTIC solution Apply 1-2 drops to toe BID after soaking (Patient not taking: Reported on 04/05/2023) 10 mL 1   osimertinib mesylate (TAGRISSO) 80 MG tablet TAKE 1 TABLET BY MOUTH DAILY. 30 tablet 2   oxymetazoline (AFRIN) 0.05 % nasal spray Place 1 spray into both nostrils daily as needed for congestion.     PREMARIN vaginal cream Place 1 applicator vaginally as needed (urinary issues).     RABEprazole (ACIPHEX) 20 MG tablet Take 1 tablet (20 mg total) by mouth 2 (two) times a week. (Patient taking differently: Take 20 mg by mouth daily.)     rosuvastatin (CRESTOR) 5 MG tablet Take 1 tablet (5 mg total) by mouth daily.  90 tablet 3   triamcinolone cream (KENALOG) 0.1 % Apply 1 Application topically 2 (two) times daily. As needed for itching/rash (Patient taking differently: Apply 1 Application topically as needed (rash/itching).) 453.6 g 0   vitamin B-12 (CYANOCOBALAMIN) 1000 MCG tablet Take 1,000 mcg by mouth daily.     No facility-administered medications prior to visit.      Review of Systems  Review of Systems  Constitutional: Negative.   HENT: Negative.    Respiratory:  Positive for cough and wheezing. Negative for chest tightness and shortness of breath.        DOE with inclines  Cardiovascular: Negative.    Physical Exam  LMP 08/21/2002  Physical Exam Constitutional:      Appearance: Normal appearance. She is not ill-appearing.  HENT:     Head: Normocephalic and atraumatic.  Cardiovascular:     Rate and Rhythm: Normal rate and regular rhythm.  Pulmonary:     Effort: Pulmonary effort is normal.     Breath sounds: Normal breath sounds. No wheezing.     Comments: CTA Skin:    General: Skin is warm and dry.  Neurological:     General: No focal deficit present.     Mental Status: She is alert and oriented to person, place, and time. Mental status is at baseline.  Psychiatric:         Mood and Affect: Mood normal.        Behavior: Behavior normal.        Thought Content: Thought content normal.        Judgment: Judgment normal.      Lab Results:  CBC    Component Value Date/Time   WBC 7.6 03/13/2023 0752   WBC 2.9 (L) 01/04/2020 1049   RBC 3.80 (L) 03/13/2023 0752   HGB 12.2 03/13/2023 0752   HGB 13.5 05/21/2013 1526   HCT 36.5 03/13/2023 0752   PLT 171 03/13/2023 0752   MCV 96.1 03/13/2023 0752   MCH 32.1 03/13/2023 0752   MCHC 33.4 03/13/2023 0752   RDW 14.1 03/13/2023 0752   LYMPHSABS 1.2 03/13/2023 0752   MONOABS 0.8 03/13/2023 0752   EOSABS 0.1 03/13/2023 0752   BASOSABS 0.0 03/13/2023 0752    BMET    Component Value Date/Time   NA 136 03/13/2023 0752   K 4.0 03/13/2023 0752   CL 103 03/13/2023 0752   CO2 28 03/13/2023 0752   GLUCOSE 112 (H) 03/13/2023 0752   BUN 14 03/13/2023 0752   CREATININE 1.06 (H) 03/13/2023 0752   CALCIUM 9.5 03/13/2023 0752   GFRNONAA 56 (L) 03/13/2023 0752   GFRAA >60 11/23/2019 1102    BNP No results found for: "BNP"  ProBNP No results found for: "PROBNP"  Imaging: CT CORONARY MORPH W/CTA COR W/SCORE W/CA W/CM &/OR WO/CM Result Date: 04/03/2023 CLINICAL DATA:  56F with chest pain EXAM: Cardiac/Coronary CTA TECHNIQUE: The patient was scanned on a [employer]. FINDINGS: A 100 kV prospective scan was triggered in the descending thoracic aorta at 111 HU's. Axial non-contrast 3 mm slices were carried out through the heart. The data set was analyzed on a dedicated work station and scored using the Agatson method. Gantry rotation speed was 250 msecs and collimation was .6 mm. No beta blockade and 0.8 mg of sl NTG was given. The 3D data set was reconstructed in 5% intervals of the 35-75% of the R-R cycle. Phases were analyzed on a dedicated work station using MPR, MIP and VRT modes.  The patient received 100 cc of contrast. Coronary Arteries:  Normal coronary origin.  Right dominance. RCA is a large dominant  artery that gives rise to PDA and PLA. Calcified plaque in proximal RCA causes 0-24% stenosis Left main is a large artery that gives rise to LAD and LCX arteries. LAD is a large vessel. Noncalcified plaque in proximal LAD causes 0-24% stenosis. Mixed plaque in mid LAD causes 0-24% stenosis. Calcified plaque in distal LAD causes 25-49% stenosis LCX is a non-dominant artery that gives rise to one large OM1 branch. Calcified plaque in proximal LCX causes 0-24% stenosis Other findings: Left Ventricle: Normal size Left Atrium: Normal size Pulmonary Veins: Normal configuration Right Ventricle: Mild dilatation Right Atrium: Mild enlargement Cardiac valves: No calcifications Thoracic aorta: Normal size Pulmonary Arteries: Normal size Systemic Veins: Normal drainage Pericardium: Normal thickness IMPRESSION: 1. Coronary calcium score of 45. This was 56th percentile for age and sex matched control. 2. Total plaque volume 36mm3 which is 21st percentile for age and sex-matched controls (calcified plaque 92mm3; noncalcified plaque 60mm3). TPV is mild 3.  Normal coronary origin with right dominance. 4.  Nonobstructive CAD 5.  Mild (25-49%) stenosis in distal LAD 6. Minimal (0-24%) stenosis in proximal/mid LAD, proximal LCX, and proximal RCA CAD-RADS 2. Mild non-obstructive CAD (25-49%). Consider non-atherosclerotic causes of chest pain. Consider preventive therapy and risk factor modification. Electronically Signed   By: Epifanio Lesches M.D.   On: 04/03/2023 16:30     Assessment & Plan:   No problem-specific Assessment & Plan notes found for this encounter.  Assessment and Plan    Adenocarcinoma of the left lung Stable under the management of Dr. Jerolyn Center. No evidence of metastatic disease on recent PET scan. -Continue current oncology management plan with Dr. Jerolyn Center.  Obstructive Lung Disease with Asthmatic Component Moderate airflow obstruction with significant improvement post-bronchodilator, indicating an  asthmatic component. Shortness of breath with exertion, occasional wheezing, and cough. -Advised patient use Stiolto inhaler consistently pre- and post-surgery to optimize lung function.  Gastroesophageal Reflux Disease (GERD) Controlled with Aciphex, considering reducing frequency of use due to potential exacerbation of reflux symptoms with long-term use. -Try Aciphex every other day and monitor symptoms. -Continue dietary modifications to control acid reflux.  Preoperative Assessment for Right Knee Surgery Intermediate risk due to age and history of adenocarcinoma and obstructive lung disease. From a pulmonary standpoint she is optimized for surgery, recommend surgery be done in-patient settings. Knee surgery scheduled for 04/16/2023. -Use Stiolto inhaler consistently pre- and post-surgery. -After surgery encourage early ambulation, use of incentive spirometer, and compression stockings recommended to reduce risk of postoperative complications.  Major Pulmonary risks identified in the multifactorial risk analysis are but not limited to a) pneumonia; b) recurrent intubation risk; c) prolonged or recurrent acute respiratory failure needing mechanical ventilation; d) prolonged hospitalization; e) DVT/Pulmonary embolism; f) Acute Pulmonary edema  Recommend 1. Short duration of surgery as much as possible and avoid paralytic if possible 2. Recovery in step down or ICU with Pulmonary consultation if needed  3. DVT prophylaxis 4. Aggressive pulmonary toilet with o2, bronchodilatation, and incentive spirometry and early ambulation    Glenford Bayley, NP 04/09/2023

## 2023-04-10 ENCOUNTER — Ambulatory Visit (HOSPITAL_COMMUNITY): Payer: Medicare Other | Admitting: Physical Therapy

## 2023-04-10 DIAGNOSIS — M25561 Pain in right knee: Secondary | ICD-10-CM

## 2023-04-10 DIAGNOSIS — M5459 Other low back pain: Secondary | ICD-10-CM

## 2023-04-10 DIAGNOSIS — R262 Difficulty in walking, not elsewhere classified: Secondary | ICD-10-CM

## 2023-04-10 DIAGNOSIS — M6281 Muscle weakness (generalized): Secondary | ICD-10-CM

## 2023-04-10 NOTE — Therapy (Signed)
[facility] OUTPATIENT PHYSICAL THERAPY Treatment     Patient Name: Linda Woods MRN: [id number] DOB:February 11, 1951, 73 y.o., female Today's Date: 04/10/2023      END OF SESSION:   PT End of Session - 04/10/23 1029     Visit Number 6    Number of Visits 8    Date for PT Re-Evaluation 04/15/23    PT Start Time 0936    PT Stop Time 1016    PT Time Calculation (min) 40 min                 Past Medical History:  Diagnosis Date   Anemia    as a child   Anxiety    Asthma 10/19/2020   Bursitis    Chronic reflux esophagitis    Complication of anesthesia    Diarrhea, functional    Diverticulosis    GERD (gastroesophageal reflux disease)    History of kidney stones    Hypertension    Knee pain    right knee-seeing ortho   lung ca 09/2019   Osteoarthritis    Plantar fasciitis    Pneumonia    PONV (postoperative nausea and vomiting)    has used the patch before and that helps   Pure hypercholesterolemia    RLS (restless legs syndrome)    Superficial vein thrombosis    Thyroid disease    hypothyroid   Past Surgical History:  Procedure Laterality Date   APPENDECTOMY     BRONCHIAL BIOPSY  10/20/2019   Procedure: BRONCHIAL BIOPSIES;  Surgeon: Leslye Peer, MD;  Location: [facility] ENDOSCOPY;  Service: Pulmonary;;   BRONCHIAL BRUSHINGS  10/20/2019   Procedure: BRONCHIAL BRUSHINGS;  Surgeon: Leslye Peer, MD;  Location: [facility] ENDOSCOPY;  Service: Pulmonary;;   BRONCHIAL NEEDLE ASPIRATION BIOPSY  10/20/2019   Procedure: BRONCHIAL NEEDLE ASPIRATION BIOPSIES;  Surgeon: Leslye Peer, MD;  Location: [facility] ENDOSCOPY;  Service: Pulmonary;;   BRONCHIAL WASHINGS  10/20/2019   Procedure: BRONCHIAL WASHINGS;  Surgeon: Leslye Peer, MD;  Location: [facility] ENDOSCOPY;  Service: Pulmonary;;   CARPAL TUNNEL RELEASE Right 2011   Dr. Amanda Pea   COLPORRHAPHY     posterior   HAND SURGERY Right 2016   Nerve surgery, Dr. Amanda Pea   OVARIAN CYST REMOVAL     RECTOCELE REPAIR  2011   w/TVH and sling    TONSILLECTOMY     TONSILLECTOMY     TOTAL KNEE ARTHROPLASTY Left 09/09/2018   Procedure: TOTAL KNEE ARTHROPLASTY, CORTISONE INJECTION RIGHT KNEE;  Surgeon: Durene Romans, MD;  Location: [facility]S;  Service: Orthopedics;  Laterality: Left;  70 mins   TOTAL VAGINAL HYSTERECTOMY  10/18/2009   rectocele repair, sling   TUBAL LIGATION Bilateral    VARICOSE VEIN SURGERY     VIDEO BRONCHOSCOPY WITH ENDOBRONCHIAL NAVIGATION N/A 10/20/2019   Procedure: VIDEO BRONCHOSCOPY WITH ENDOBRONCHIAL NAVIGATION;  Surgeon: Leslye Peer, MD;  Location: [facility] ENDOS[facility];  Service: Pulmonary;  Laterality: N/A;   Patient Active Problem List   Diagnosis Date Noted   Allergic rhinitis 10/19/2020   Asthma 10/19/2020   Cough 08/09/2020   Drug-induced diarrhea 02/22/2020   Adenocarcinoma of left lung, stage 3 (HCC) 11/03/2019   Encounter for antineoplastic chemotherapy 11/03/2019   Goals of care, counseling/discussion 11/03/2019   Pneumothorax, left 10/20/2019   Mass of left lung 10/16/2019   History of adenomatous polyp of colon 09/29/2018   Diverticulosis 09/29/2018   Internal hemorrhoids 09/29/2018   S/P left TKA 09/09/2018   Status post total  left knee replacement 09/09/2018   Pain of left heel 07/03/2017   Pain in joint of right hip 05/17/2017   Benign essential hypertension 11/17/2015   Abnormal weight gain 11/17/2015   Obesity, Class I, BMI 30-34.9 11/17/2015   Gastroesophageal reflux disease without esophagitis 10/12/2014   Hypertriglyceridemia 10/12/2014   Hypertensive disorder 05/21/2013   Hypothyroidism 05/21/2013   Vitamin D deficiency 05/21/2013    PCP: Assunta Found, MDRef Provider (PCP)   REFERRING PROVIDER:   Pincus Badder, FNP    REFERRING DIAG:  Free Text Diagnosis  LBP    Rationale for Evaluation and Treatment: Rehabilitation  THERAPY DIAG:  Difficulty in walking, not elsewhere classified  Muscle weakness (generalized)  Right knee pain, unspecified chronicity  Other  low back pain  ONSET DATE: January 16, 2023    SUBJECTIVE:                                                                                                                                                                                           SUBJECTIVE STATEMENT:  Pt states that she has a bladder infection    Patient is scheduled for right TKR on 04/16/23    PERTINENT HISTORY:  Terminal lung cancer for 3 years +; controlled with medicine TOTAL KNEE ARTHROPLASTY Left 09/09/2018   Asthma     PAIN:  Are you having pain? Yes: NPRS scale: 6/10 pt has a patch on her back  Pain location: across lumbar  Pain description: constant Aggravating factors: bending, ADLS, lifting  Relieving factors: lidocaine patch  PRECAUTIONS: None  RED FLAGS: None   WEIGHT BEARING RESTRICTIONS: No  FALLS:  Has patient fallen in last 6 months? 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  No acute osseous abnormalities.    SCREENING FOR RED FLAGS: Bowel or bladder incontinence: No Spinal tumors: No Cauda equina syndrome: No Compression fracture: No Abdominal aneurysm: No  COGNITION: Overall cognitive status: Within functional limits for tasks assessed  POSTURE: decreased lumbar lordosis, increased thoracic kyphosis, and flexed trunk       FUNCTIONAL TESTS:  5 times sit to stand: 20.94s with no upper extremity assist, PT stand-by assist   20-29 years: 6.0  1.4 seconds 30-39 years:  6.1  1.4 seconds 40-49 years: 7.6  1.8 seconds 50-59 years: 7.7  2.6  seconds 60-69 years: 8.4  0.0 seconds (female), 12.7  1.8 seconds (female) 70-79 years: 11.6  3.4 seconds (female), 13.0  4.8 seconds (female) 80-89 years: 16.7  4.5 seconds (female), 17.2  5.5 seconds (female) 90+ years: 19.5  2.3 seconds (female), 22.9  9.6 seconds (female)  The Minimal Clinically Important Difference (MCID) is 2.3 seconds.  A score of 16 seconds or less indicates that the participant is not likely to fall.  A score of more than 16 seconds indicates a higher risk of falls  As per American Physical Therapy Association (APTA) website   2 minute walk test: next session    GAIT ANALYSIS:  Distance walked: 4ft Assistive device utilized: None Level of assistance: Complete Independence Comments: moderate antalgia right lower extremity; decreased cadence/step length   SENSATION: WFL    LOWER EXTREMITY MMT:    MMT Right eval Right  Left eval Left   Hip flexion 3/5  3/5   Hip extension  3+  3  Hip abduction 3+/5  3+/5   Hip adduction      Hip internal rotation      Hip external rotation      Knee flexion 3+/5  3+/5   Knee extension 4-/5  4-/5   Ankle dorsiflexion      Ankle plantarflexion      Ankle inversion      Ankle eversion       (Blank rows = not tested)  LOWER EXTREMITY ROM:     Active  Right eval Left eval  Hip flexion WFL   Hip extension    Hip abduction    Hip adduction    Hip internal rotation [facility] WFL  Hip external rotation [facility]  Knee flexion 0-120   Knee extension WFL   Ankle dorsiflexion    Ankle plantarflexion    Ankle inversion    Ankle eversion     (Blank rows = not tested)  LOWER EXTREMITY SPECIAL TESTS  Luisa Hart (FABER) test: positive on right lower extremity    PALPATION: Moderate tenderness to palpation bilateral lumbar paraspinals  INTEGUMENTARY   [facility]  03/21/23 326 ft walk in 2 minutes pain in knee that she is to get a TKR  5/10   TODAY'S TREATMENT:                                                                                                                               DATE:  04/10/23 Wall arch x 10 Hip excursion x 3  Theraband green t band: rows, scapular retraction and shoulder extension x 10 each  Tandem stance both with Rt and Lt in front x 10  B knee to chest x 3  Side lying clam x 15 B Dead bug x 5 very difficult for pt to coordinate Bridge x 15  Prone: Heel squeeze x 10 Single leg  raise x 10  Sitting: Thoracic excursion x 3   04/05/23 Theraband postural exercises: Green Scapular retraction x 10 Rows x 10 Shoulder extension x  10  Wall arch x 10  Squat x 5 Side step x  Rt down long line had to stop due to knee pain; pt is having TKR in two weeks  Supine:  Dead bug x10 Knee to chest 30" x 3 Bed mobility 03/28/23: Standing: Marching 10x 3" with ab set Heel raise 10x Toe raise 10x Abduction 10x  RTB rows10x RTB shoulder extension  Supine: Bridge with GTB 10x 5" Clam with ab set GTB 10x 5" each Marching with GTB around thigh with ab set 10x 5" SLR with ab set and quad set prior raise 10x SKTC 2x 30" Hamstring stretch 2x 30"  Sidelying: Abduction 10x PATIENT EDUCATION:  Education details: HEP as well as proper bed mobility.   Person educated: Patient Education method: Explanation Education comprehension: verbalized understanding  HOME EXERCISE PROGRAM: Access Code: [email or web address] URL: [email or web address] Date: 03/18/2023 Prepared by: Seymour Bars  Exercises - Supine March with Resistance Band  - 1-2 x daily - 3 sets - 10 reps - Hooklying Clamshell with Resistance  - 1-2 x daily - 3 sets - 10 reps  03/21/23 - Seated Correct Posture  - 2 x daily - 7 x weekly - 1 sets - 10 reps - 3-5: hold - Seated Scapular Retraction  - 2 x daily - 7 x weekly - 1 sets - 10 reps - 3-5" hold - Supine Bridge with Resistance Band  - 1-2 x daily - 7 x weekly - 1 sets - 10 reps - 3-5 hold - Supine March  - 1-2 x daily - 7 x weekly - 1 sets - 10 reps - 3-5  hold  Access Code: [email or web address] URL: [email or web address] Date: 03/25/2023 Prepared by: Becky Sax  Exercises - Clam with Resistance  - 1-2 x daily - 7 x weekly - 1 sets - 10 reps - Supine Quad Set  - 2 x daily - 7 x weekly - 1 sets - 10 reps - Active Straight Leg Raise with Quad Set  - 1 x daily - 7 x weekly - 3 sets - 10 reps - Hooklying Single Knee to Chest  - 1-2 x daily - 7 x weekly - 1 sets - 3 reps - 20" hold 2023-05-06 Dead bug  Tandem stance   ASSESSMENT:  CLINICAL IMPRESSION:   Therapist added dead bug and tandem stance to pt with pt having difficulty with both. Given both for HEP.  Pt will be seen one more time and then discharge as she will be having a TKR next week.    objective impairments: Abnormal gait, decreased activity tolerance, difficulty walking, decreased strength, increased muscle spasms, improper body mechanics, postural dysfunction, and pain. These impairments limit the patient in activities such as carrying, lifting, bending, sitting, standing, squatting, stairs, locomotion level, and caring for others. These impairments also limit the patient in participation such as meal prep, cleaning, laundry, shopping, community activity, and yard work. The patient will benefit from PT to address the limitations/impairments listed below to return to their prior level of function in the domains of activity and participation.   Therapist reviewed goals and evaluation with pt.  Completed mm testing for gluteal mm as well as 2 MWT.  Pt needs cuing to complete exercises with proper stabilization but able to show good form after cuing.  presents to PT with the following objective impairments:  Abnormal gait, decreased activity tolerance, difficulty walking, decreased strength, increased muscle spasms, improper body mechanics, postural dysfunction, and pain. These impairments limit the patient in activities such as carrying, lifting, bending, sitting, standing, squatting,  stairs, locomotion level, and caring for others. These impairments also limit the patient in participation such as meal prep, cleaning, laundry, shopping, community activity, and yard work. The patient will benefit from PT to address the limitations/impairments listed below to return to their prior level of function in the domains of activity and participation.    REHAB POTENTIAL: Good  CLINICAL DECISION MAKING: Stable/uncomplicated  EVALUATION COMPLEXITY: Low     GOALS: Goals reviewed with patient? No  SHORT TERM GOALS: Target date: 2 weeks  1. Patient will be independent with a basic stretching/strengthening HEP  Baseline:  Goal status: on-going    LONG TERM GOALS: Target date: 4 weeks  Patient will walk >/= 200 feet on the with 1-2/10 pain in ADL completion, home/community ambulation, and lifting/bending/squatting. Baseline:  Goal status: on-going   2.  Patient will complete the 5 times sit to stand:    within 16s to demonstrate an improvement in ADL completion, stair negotiation, household/community ambulation, and self-care Baseline:  Goal status: on-going   3.  Patient will be independent with a comprehensive strengthening HEP  Baseline:  Goal status: on-going     PLAN:  PT FREQUENCY: 2x/week  PT DURATION: 4 weeks  PLANNED INTERVENTIONS: 97110-Therapeutic exercises, 97530- Therapeutic activity, 97112- Neuromuscular re-education, 97535- Self Care, 91478- Manual therapy, [id number]- Gait training, 97014- Electrical stimulation (unattended), [phone]- Electrical stimulation (manual), Patient/Family education, Balance training, Stair training, Taping, Dry Needling, Joint mobilization, Joint manipulation, Spinal manipulation, Cryotherapy, and Moist heat.  PLAN FOR NEXT SESSION: Reassess for discharge.   Virgina Organ, PT CLT [phone]  04/10/2023, 10:30 AM

## 2023-04-11 ENCOUNTER — Encounter (HOSPITAL_COMMUNITY): Payer: Self-pay

## 2023-04-11 ENCOUNTER — Other Ambulatory Visit: Payer: Self-pay

## 2023-04-11 NOTE — Progress Notes (Signed)
Case: [id number] Date/Time: 04/16/23 0950   Procedure: TOTAL KNEE ARTHROPLASTY (Right: Knee)   Anesthesia type: Spinal   Pre-op diagnosis: Right Knee Osteoarthritis   Location: [facility] 09 / [facility]   Surgeons: Durene Romans, MD       DISCUSSION: Linda Woods is a 73 yo female who presents to PAT prior to surgery above. PMH of HTN, coronary calcification (by CT), hx of lung cancer s/p chemo and XRT, asthma, RLS, GERD, hiatal hernia, hypothyroidism, anxiety, anemia, CKD  Prior anesthesia complications include PONV  Patient follows with Cardiology for hx of coronary calcification by CT. Last seen on 01/02/23. She reported intermittent SOB from lung cancer but that it was stable/chronic. No CP but some chest discomfort thought to be due to GERD. Per op clearance was done which recommended:  "Preoperative Cardiac Clearance for Knee Replacement - She reports she might be undergoing knee replacement in the future but has not yet met with her orthopedic surgeon to discuss this. Given that she does have baseline dyspnea on exertion in the setting of lung cancer and has never undergone workup for coronary calcification by CT imaging, would recommend either a Lexiscan Myoview or Coronary CT for further assessment if she pursues surgery in the interim."  CTA coronaries was done on 04/02/23 which showed mild non obstructive CAD. Advised continue medical therapy.  Seen by Pulmonology on 04/09/23 for pre op evaluation. "Preoperative Assessment for Right Knee Surgery Intermediate risk due to age and history of adenocarcinoma and obstructive lung disease. From a pulmonary standpoint she is optimized for surgery, recommend surgery be done in-patient settings. Knee surgery scheduled for 04/16/2023. -Use Stiolto inhaler consistently pre- and post-surgery. -After surgery encourage early ambulation, use of incentive spirometer, and compression stockings recommended to reduce risk of postoperative  complications."  Follows with Oncology for left lung cancer s/p chemo and XRT. Currently on Tagrisso. Recent PET scan showing possible osseous mets. Last seen on 1/22/5. Advised f/u in 2 months.  VS: BP 111/79   Pulse 96   Temp 36.4 C (Oral)   Resp 18   Ht 5\' 4"  (1.626 m)   Wt 72.6 kg   LMP 08/21/2002   SpO2 99%   BMI 27.46 kg/m   PROVIDERS: Assunta Found, MD Cardiologist - Lance Muss, MD Oncologist- Si Gaul, MD Pulmonology: Levy Pupa, MD   LABS: Labs reviewed: Acceptable for surgery. (all labs ordered are listed, but only abnormal results are displayed)  Labs Reviewed  SURGICAL PCR SCREEN - Abnormal; Notable for the following components:      Result Value   MRSA, PCR POSITIVE (*)    Staphylococcus aureus POSITIVE (*)    All other components within normal limits     IMAGES: PET scan 03/01/23:  IMPRESSION: 1. No evidence of metabolically active primary or metastatic lung cancer. 2. Quiescent osseous metastatic disease. 3. Chronic small mildly hypermetabolic left periaortic lymph nodes, nonspecific. 4. 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 5. Small to moderate hiatal hernia. 6. Aortic atherosclerosis (ICD10-I70.0). Coronary artery calcification.  EKG:   CV:  CTA Coronaries 04/02/23:  IMPRESSION: 1. Coronary calcium score of 45. This was 56th percentile for age and sex matched control.   2. Total plaque volume 47mm3 which is 21st percentile for age and sex-matched controls (calcified plaque 76mm3; noncalcified plaque 14mm3). TPV is mild   3.  Normal coronary origin with right dominance.   4.  Nonobstructive CAD   5.  Mild (25-49%) stenosis in distal LAD  6. Minimal (0-24%) stenosis in proximal/mid LAD, proximal LCX, and proximal RCA   CAD-RADS 2. Mild non-obstructive CAD (25-49%). Consider non-atherosclerotic causes of chest pain. Consider preventive therapy and risk factor modification.  Echo  07/19/21:  IMPRESSIONS     1. Left ventricular ejection fraction, by estimation, is 55 to 60%. The  left ventricle has normal function. The left ventricle has no regional  wall motion abnormalities. Left ventricular diastolic parameters are  consistent with Grade I diastolic  dysfunction (impaired relaxation). The average left ventricular global  longitudinal strain is -19.2 %.   2. Right ventricular systolic function is normal. The right ventricular  size is normal. There is normal pulmonary artery systolic pressure. The  estimated right ventricular systolic pressure is 31.7 mmHg.   3. The mitral valve is normal in structure. Moderate  thickening/calcification of leaflets. No evidence of mitral valve  regurgitation. No evidence of mitral stenosis.   4. The aortic valve is tricuspid. Aortic valve regurgitation is trivial.  No aortic stenosis is present.   5. The inferior vena cava is normal in size with greater than 50%  respiratory variability, suggesting right atrial pressure of 3 mmHg.    Past Medical History:  Diagnosis Date   Anemia    as a child   Anxiety    Asthma 10/19/2020   Bursitis    Chronic reflux esophagitis    Complication of anesthesia    Diarrhea, functional    Diverticulosis    GERD (gastroesophageal reflux disease)    History of kidney stones    Hypertension    Knee pain    right knee-seeing ortho   lung ca 09/2019   Osteoarthritis    Plantar fasciitis    Pneumonia    PONV (postoperative nausea and vomiting)    has used the patch before and that helps   Pure hypercholesterolemia    RLS (restless legs syndrome)    Superficial vein thrombosis    Thyroid disease    hypothyroid    Past Surgical History:  Procedure Laterality Date   APPENDECTOMY     BRONCHIAL BIOPSY  10/20/2019   Procedure: BRONCHIAL BIOPSIES;  Surgeon: Leslye Peer, MD;  Location[facility] ENDOSCOPY;  Service: Pulmonary;;   BRONCHIAL BRUSHINGS  10/20/2019   Procedure: BRONCHIAL  BRUSHINGS;  Surgeon: Leslye Peer, MD;  Location: [facility] ENDOSCOPY;  Service: Pulmonary;;   BRONCHIAL NEEDLE ASPIRATION BIOPSY  10/20/2019   Procedure: BRONCHIAL NEEDLE ASPIRATION BIOPSIES;  Surgeon: Leslye Peer, MD;  Location: [facility] ENDOSCOPY;  Service: Pulmonary;;   BRONCHIAL WASHINGS  10/20/2019   Procedure: BRONCHIAL WASHINGS;  Surgeon: Leslye Peer, MD;  Location: [facility] ENDOSCOPY;  Service: Pulmonary;;   CARPAL TUNNEL RELEASE Right 2011   Dr. Amanda Pea   COLPORRHAPHY     posterior   HAND SURGERY Right 2016   Nerve surgery, Dr. Amanda Pea   OVARIAN CYST REMOVAL     RECTOCELE REPAIR  2011   w/TVH and sling   TONSILLECTOMY     TONSILLECTOMY     TOTAL KNEE ARTHROPLASTY Left 09/09/2018   Procedure: TOTAL KNEE ARTHROPLASTY, CORTISONE INJECTION RIGHT KNEE;  Surgeon: Durene Romans, MD;  Location: [facility];  Service: Orthopedics;  Laterality: Left;  70 mins   TOTAL VAGINAL HYSTERECTOMY  10/18/2009   rectocele repair, sling   TUBAL LIGATION Bilateral    VARICOSE VEIN SURGERY     VIDEO BRONCHOSCOPY WITH ENDOBRONCHIAL NAVIGATION N/A 10/20/2019   Procedure: VIDEO BRONCHOSCOPY WITH ENDOBRONCHIAL NAVIGATION;  Surgeon: Leslye Peer, MD;  Location: [facility] ENDOSCOPY;  Service: Pulmonary;  Laterality: N/A;    MEDICATIONS:  albuterol (VENTOLIN HFA) 108 (90 Base) MCG/ACT inhaler   cholecalciferol (VITAMIN D3) 25 MCG (1000 UNIT) tablet   cyclobenzaprine (FLEXERIL) 10 MG tablet   famotidine (PEPCID) 40 MG tablet   ferrous sulfate 325 (65 FE) MG tablet   fexofenadine (ALLEGRA) 180 MG tablet   fluticasone (FLONASE) 50 MCG/ACT nasal spray   HYDROcodone-acetaminophen (NORCO/VICODIN) 5-325 MG tablet   levothyroxine (SYNTHROID) 100 MCG tablet   lidocaine (LIDODERM) 5 %   loperamide (IMODIUM) 2 MG capsule   LORazepam (ATIVAN) 0.5 MG tablet   MAGNESIUM PO   melatonin 5 MG TABS   metoprolol succinate (TOPROL-XL) 50 MG 24 hr tablet   metoprolol tartrate (LOPRESSOR) 100 MG tablet   MYRBETRIQ 50 MG TB24 tablet    osimertinib mesylate (TAGRISSO) 80 MG tablet   oxymetazoline (AFRIN) 0.05 % nasal spray   POTASSIUM PO   PREMARIN vaginal cream   RABEprazole (ACIPHEX) 20 MG tablet   rosuvastatin (CRESTOR) 5 MG tablet   triamcinolone cream (KENALOG) 0.1 %   vitamin B-12 (CYANOCOBALAMIN) 1000 MCG tablet   No current facility-administered medications for this encounter.   Marcille Blanco [facility] Surgical Short Stay/Anesthesiology [facility] Phone [phone] 04/11/2023 11:38 AM

## 2023-04-11 NOTE — Anesthesia Preprocedure Evaluation (Addendum)
 Anesthesia Evaluation  Patient identified by MRN, date of birth, ID band Patient awake    Reviewed: Allergy & Precautions, NPO status , Patient's Chart, lab work & pertinent test results  History of Anesthesia Complications (+) PONV and history of anesthetic complications  Airway Mallampati: II  TM Distance: >3 FB Neck ROM: Full    Dental  (+) Dental Advisory Given   Pulmonary shortness of breath, asthma  Lung CA   breath sounds clear to auscultation       Cardiovascular hypertension, Pt. on medications and Pt. on home beta blockers  Rhythm:Regular Rate:Normal     Neuro/Psych negative neurological ROS     GI/Hepatic Neg liver ROS,GERD  ,,  Endo/Other  Hypothyroidism    Renal/GU negative Renal ROS     Musculoskeletal  (+) Arthritis ,    Abdominal   Peds  Hematology negative hematology ROS (+)   Anesthesia Other Findings   Reproductive/Obstetrics                              Lab Results  Component Value Date   WBC 7.6 03/13/2023   HGB 12.2 03/13/2023   HCT 36.5 03/13/2023   MCV 96.1 03/13/2023   PLT 171 03/13/2023   Lab Results  Component Value Date   NA 136 03/13/2023   CL 103 03/13/2023   K 4.0 03/13/2023   CO2 28 03/13/2023   BUN 14 03/13/2023   CREATININE 1.06 (H) 03/13/2023   GFRNONAA 56 (L) 03/13/2023   CALCIUM 9.5 03/13/2023   ALBUMIN 4.2 03/13/2023   GLUCOSE 112 (H) 03/13/2023    Anesthesia Physical Anesthesia Plan  ASA: 3  Anesthesia Plan: Spinal   Post-op Pain Management: Ofirmev IV (intra-op)* and Regional block*   Induction:   PONV Risk Score and Plan: 3 and Dexamethasone, Ondansetron, Propofol infusion and Treatment may vary due to age or medical condition  Airway Management Planned: Natural Airway and Simple Face Mask  Additional Equipment:   Intra-op Plan:   Post-operative Plan:   Informed Consent: I have reviewed the patients History and  Physical, chart, labs and discussed the procedure including the risks, benefits and alternatives for the proposed anesthesia with the patient or authorized representative who has indicated his/her understanding and acceptance.       Plan Discussed with:   Anesthesia Plan Comments: ( )         Anesthesia Quick Evaluation

## 2023-04-11 NOTE — H&P (Signed)
TOTAL KNEE ADMISSION H&P  Patient is being admitted for right total knee arthroplasty.  Therapy Plans: outpatient therapy at [facility] Disposition: Home with husband Planned DVT Prophylaxis: Xarelto 10mg  daily DME needed: none PCP: Dr. Phillips Odor Oncologist: Dr. Shirline Frees - clearance received (instructed to stay on her monoclonal ab Tagrisso) Cardiologist: Dr. 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  HPI: Linda Woods, 73 y.o. female, has a history of pain and functional disability in the right knee due to arthritis and has failed non-surgical conservative treatments for greater than 12 weeks to includeNSAID's and/or analgesics, corticosteriod injections, and activity modification.  Onset of symptoms was gradual, starting 2 years ago with gradually worsening course since that time. The patient noted no past surgery on the right knee(s).  Patient currently rates pain in the right knee(s) at 8 out of 10 with activity. Patient has worsening of pain with activity and weight bearing and pain that interferes with activities of daily living.  Patient has evidence of joint space narrowing by imaging studies. There is no active infection.  Patient Active Problem List   Diagnosis Date Noted   Allergic rhinitis 10/19/2020   Asthma 10/19/2020   Cough 08/09/2020   Drug-induced diarrhea 02/22/2020   Adenocarcinoma of left lung, stage 3 (HCC) 11/03/2019   Encounter for antineoplastic chemotherapy 11/03/2019   Goals of care, counseling/discussion 11/03/2019   Pneumothorax, left 10/20/2019   Mass of left lung 10/16/2019   History of adenomatous polyp of colon 09/29/2018    Diverticulosis 09/29/2018   Internal hemorrhoids 09/29/2018   S/P left TKA 09/09/2018   Status post total left knee replacement 09/09/2018   Pain of left heel 07/03/2017   Pain in joint of right hip 05/17/2017   Benign essential hypertension 11/17/2015   Abnormal weight gain 11/17/2015   Obesity, Class I, BMI 30-34.9 11/17/2015   Gastroesophageal reflux disease without esophagitis 10/12/2014   Hypertriglyceridemia 10/12/2014   Hypertensive disorder 05/21/2013   Hypothyroidism 05/21/2013   Vitamin D deficiency 05/21/2013   Past Medical History:  Diagnosis Date   Anemia    as a child   Anxiety    Asthma 10/19/2020   Bursitis    Chronic reflux esophagitis    Complication of anesthesia    Diarrhea, functional    Diverticulosis    GERD (gastroesophageal reflux disease)    History of kidney stones    Hypertension    Knee pain    right knee-seeing ortho   lung ca 09/2019   Osteoarthritis    Plantar fasciitis    Pneumonia    PONV (postoperative nausea and vomiting)    has used the patch before and that helps   Pure hypercholesterolemia    RLS (restless legs syndrome)    Superficial vein thrombosis    Thyroid disease    hypothyroid    Past Surgical History:  Procedure Laterality Date   APPENDECTOMY     BRONCHIAL BIOPSY  10/20/2019   Procedure: BRONCHIAL BIOPSIES;  Surgeon: Leslye Peer, MD;  Location: [facility] ENDOSCOPY;  Service: Pulmonary;;   BRONCHIAL BRUSHINGS  10/20/2019   Procedure: BRONCHIAL BRUSHINGS;  Surgeon: Leslye Peer, MD;  Location: [facility] ENDOSCOPY;  Service: Pulmonary;;  BRONCHIAL NEEDLE ASPIRATION BIOPSY  10/20/2019   Procedure: BRONCHIAL NEEDLE ASPIRATION BIOPSIES;  Surgeon: Leslye Peer, MD;  Location: [facility] ENDOSCOPY;  Service: Pulmonary;;   BRONCHIAL WASHINGS  10/20/2019   Procedure: BRONCHIAL WASHINGS;  Surgeon: Leslye Peer, MD;  Location: [facility] ENDOSCOPY;  Service: Pulmonary;;   CARPAL TUNNEL RELEASE Right 2011   Dr. Amanda Pea   COLPORRHAPHY      posterior   HAND SURGERY Right 2016   Nerve surgery, Dr. Amanda Pea   OVARIAN CYST REMOVAL     RECTOCELE REPAIR  2011   w/TVH and sling   TONSILLECTOMY     TONSILLECTOMY     TOTAL KNEE ARTHROPLASTY Left 09/09/2018   Procedure: TOTAL KNEE ARTHROPLASTY, CORTISONE INJECTION RIGHT KNEE;  Surgeon: Durene Romans, MD;  Location: [facility] ORS;  Service: Orthopedics;  Laterality: Left;  70 mins   TOTAL VAGINAL HYSTERECTOMY  10/18/2009   rectocele repair, sling   TUBAL LIGATION Bilateral    VARICOSE VEIN SURGERY     VIDEO BRONCHOSCOPY WITH ENDOBRONCHIAL NAVIGATION N/A 10/20/2019   Procedure: VIDEO BRONCHOSCOPY WITH ENDOBRONCHIAL NAVIGATION;  Surgeon: Leslye Peer, MD;  Location: [facility] ENDOSCOPY;  Service: Pulmonary;  Laterality: N/A;    No current facility-administered medications for this encounter.   Current Outpatient Medications  Medication Sig Dispense Refill Last Dose/Taking   albuterol (VENTOLIN HFA) 108 (90 Base) MCG/ACT inhaler Inhale 2 puffs into the lungs every 6 (six) hours as needed for wheezing or shortness of breath. 8.5 g 0 Taking As Needed   cholecalciferol (VITAMIN D3) 25 MCG (1000 UNIT) tablet Take 1,000 Units by mouth daily. Pt states she take 2000 units/day   Taking   cyclobenzaprine (FLEXERIL) 10 MG tablet Take 10 mg by mouth 2 (two) times daily as needed for muscle spasms.   Taking As Needed   famotidine (PEPCID) 40 MG tablet Take 40 mg by mouth at bedtime.   Taking   ferrous sulfate 325 (65 FE) MG tablet Take 325 mg by mouth daily with breakfast.   Taking   fexofenadine (ALLEGRA) 180 MG tablet Take 180 mg by mouth daily as needed for allergies.   Taking As Needed   fluticasone (FLONASE) 50 MCG/ACT nasal spray Place 1 spray into both nostrils as needed for allergies.   Taking As Needed   HYDROcodone-acetaminophen (NORCO/VICODIN) 5-325 MG tablet Take 0.5-1 tablets by mouth daily as needed for moderate pain (pain score 4-6) or severe pain (pain score 7-10).   Taking As Needed    levothyroxine (SYNTHROID) 100 MCG tablet Take 1 tablet (100 mcg total) by mouth daily. One po qd 90 tablet 0 Taking   lidocaine (LIDODERM) 5 % Place 1 patch onto the skin daily.   Taking   loperamide (IMODIUM) 2 MG capsule Take 2 mg by mouth every 6 (six) hours as needed for diarrhea or loose stools.   Taking As Needed   LORazepam (ATIVAN) 0.5 MG tablet Take 0.5 mg by mouth at bedtime.    Taking   MAGNESIUM PO Take 1 tablet by mouth daily.   Taking   melatonin 5 MG TABS Take 5 mg by mouth at bedtime as needed (sleep).   Taking As Needed   metoprolol succinate (TOPROL-XL) 50 MG 24 hr tablet Take 1 tablet (50 mg total) by mouth daily. 90 tablet 3 Taking   MYRBETRIQ 50 MG TB24 tablet Take 50 mg by mouth daily.   Taking   osimertinib mesylate (TAGRISSO) 80 MG tablet TAKE 1 TABLET BY MOUTH DAILY. 30 tablet 2  Taking   oxymetazoline (AFRIN) 0.05 % nasal spray Place 1 spray into both nostrils daily as needed for congestion.   Taking As Needed   POTASSIUM PO Take 1 tablet by mouth daily.   Taking   PREMARIN vaginal cream Place 1 applicator vaginally as needed (urinary issues).   Taking As Needed   RABEprazole (ACIPHEX) 20 MG tablet Take 1 tablet (20 mg total) by mouth 2 (two) times a week. (Patient taking differently: Take 20 mg by mouth daily.)   Taking Differently   rosuvastatin (CRESTOR) 5 MG tablet Take 1 tablet (5 mg total) by mouth daily. 90 tablet 3 Taking   triamcinolone cream (KENALOG) 0.1 % Apply 1 Application topically 2 (two) times daily. As needed for itching/rash (Patient taking differently: Apply 1 Application topically as needed (rash/itching).) 453.6 g 0 Taking Differently   vitamin B-12 (CYANOCOBALAMIN) 1000 MCG tablet Take 1,000 mcg by mouth daily.   Taking   metoprolol tartrate (LOPRESSOR) 100 MG tablet Take 1 tablet (100 mg total) by mouth as directed. Take 2 hours prior to CT Scan ( Hold Toprol the day of CT Scan ) 1 tablet 0 Not Taking   No Known Allergies  Social History   Tobacco  Use   Smoking status: Never   Smokeless tobacco: Never  Substance Use Topics   Alcohol use: Yes    Alcohol/week: 1.0 standard drink of alcohol    Types: 1 Glasses of wine per week    Family History  Problem Relation Age of Onset   Cervical cancer Mother    Heart failure Mother    Heart failure Father    Diabetes Father    Cancer Brother    Breast cancer Maternal Aunt    Breast cancer Paternal Aunt      Review of Systems  Constitutional:  Negative for chills and fever.  Respiratory:  Negative for cough and shortness of breath.   Cardiovascular:  Negative for chest pain.  Gastrointestinal:  Negative for nausea and vomiting.  Musculoskeletal:  Positive for arthralgias.     Objective:  Physical Exam Well nourished and well developed. General: Alert and oriented x3, cooperative and pleasant, no acute distress. Head: normocephalic, atraumatic, neck supple.  Musculoskeletal: BMI recorded at 27.8 No reports of any unexpected weight loss. Very pleasant 73 year old female awake alert and oriented. She is in no acute distress. She walks in the office with a mild limp favoring her right knee associated with discomfort as well as a valgus right knee compared to her left knee which is neutrally aligned following arthroplasty  Right knee exam: No palpable effusion, warmth erythema Slight flexion contracture associated with her valgus right knee Tenderness laterally and anteriorly Flexion to 120 degrees with tightness and crepitation anteriorly Left knee exam: Well-healed surgical incision with full knee extension and flexion without significant tenderness to palpation   Calves soft and nontender. Motor function intact in LE. Strength 5/5 LE bilaterally. Neuro: Distal pulses 2+. Sensation to light touch intact in LE.  Vital signs in last 24 hours:    Labs:   Estimated body mass index is 27.91 kg/m as calculated from the following:   Height as of 04/09/23: 5\' 4"  (1.626 m).    Weight as of 04/09/23: 73.8 kg.   Imaging Review Plain radiographs demonstrate severe degenerative joint disease of the right knee(s). The overall alignment isneutral. The bone quality appears to be adequate for age and reported activity level.      Assessment/Plan:  End stage arthritis,  right knee   The patient history, physical examination, clinical judgment of the provider and imaging studies are consistent with end stage degenerative joint disease of the right knee(s) and total knee arthroplasty is deemed medically necessary. The treatment options including medical management, injection therapy arthroscopy and arthroplasty were discussed at length. The risks and benefits of total knee arthroplasty were presented and reviewed. The risks due to aseptic loosening, infection, stiffness, patella tracking problems, thromboembolic complications and other imponderables were discussed. The patient acknowledged the explanation, agreed to proceed with the plan and consent was signed. Patient is being admitted for inpatient treatment for surgery, pain control, PT, OT, prophylactic antibiotics, VTE prophylaxis, progressive ambulation and ADL's and discharge planning. The patient is planning to be discharged  home.     Patient's anticipated LOS is less than 2 midnights, meeting these requirements: - Younger than 40 - Lives within 1 hour of care - Has a competent adult at home to recover with post-op recover - NO history of  - Chronic pain requiring opiods  - Diabetes  - Coronary Artery Disease  - Heart failure  - Heart attack  - Stroke  - DVT/VTE  - Cardiac arrhythmia  - Respiratory Failure/COPD  - Renal failure  - Anemia  - Advanced Liver disease  Rosalene Billings, PA-C Orthopedic Surgery [facility] [place] [phone]

## 2023-04-12 ENCOUNTER — Ambulatory Visit (HOSPITAL_COMMUNITY): Payer: Medicare Other | Admitting: Physical Therapy

## 2023-04-12 DIAGNOSIS — M25561 Pain in right knee: Secondary | ICD-10-CM

## 2023-04-12 DIAGNOSIS — M5459 Other low back pain: Secondary | ICD-10-CM

## 2023-04-12 DIAGNOSIS — R262 Difficulty in walking, not elsewhere classified: Secondary | ICD-10-CM

## 2023-04-12 DIAGNOSIS — M6281 Muscle weakness (generalized): Secondary | ICD-10-CM | POA: Diagnosis not present

## 2023-04-12 NOTE — Therapy (Signed)
[facility] OUTPATIENT PHYSICAL THERAPY Treatment     Patient Name: Linda Woods MRN: [id number] DOB:12-Oct-1950, 73 y.o., female Today's Date: 04/12/2023  PHYSICAL THERAPY DISCHARGE SUMMARY  Visits from Start of Care: 7  Current functional level related to goals / functional outcomes: See below   Remaining deficits: Pt unable to complete bending and lifting task with good body mechanics secondary to Rt knee pain pt having TKR next week   Education / Equipment: Pt has HEP   Patient agrees to discharge. Patient goals were met. Patient is being discharged due to meeting the stated rehab goals.     END OF SESSION:   PT End of Session - 04/12/23 0918     Visit Number 7    Number of Visits 7    Date for PT Re-Evaluation 04/15/23    Authorization Type [employer] Medicare    PT Start Time 4098    PT Stop Time 0919    PT Time Calculation (min) 27 min    Activity Tolerance Patient tolerated treatment well;Patient limited by pain;No increased pain    Behavior During Therapy WFL for tasks assessed/performed                  Past Medical History:  Diagnosis Date   Anemia    as a child   Anxiety    Asthma 10/19/2020   Bursitis    Chronic reflux esophagitis    Complication of anesthesia    Diarrhea, functional    Diverticulosis    GERD (gastroesophageal reflux disease)    History of kidney stones    Hypertension    Knee pain    right knee-seeing ortho   lung ca 09/2019   Osteoarthritis    Plantar fasciitis    Pneumonia    PONV (postoperative nausea and vomiting)    has used the patch before and that helps   Pure hypercholesterolemia    RLS (restless legs syndrome)    Superficial vein thrombosis    Thyroid disease    hypothyroid   Past Surgical History:  Procedure Laterality Date   APPENDECTOMY     BRONCHIAL BIOPSY  10/20/2019   Procedure: BRONCHIAL BIOPSIES;  Surgeon: Leslye Peer, MD;  Location: [facility] ENDOSCOPY;  Service: Pulmonary;;   BRONCHIAL BRUSHINGS   10/20/2019   Procedure: BRONCHIAL BRUSHINGS;  Surgeon: Leslye Peer, MD;  Location: [facility] ENDOSCOPY;  Service: Pulmonary;;   BRONCHIAL NEEDLE ASPIRATION BIOPSY  10/20/2019   Procedure: BRONCHIAL NEEDLE ASPIRATION BIOPSIES;  Surgeon: Leslye Peer, MD;  Location: [facility] ENDOSCOPY;  Service: Pulmonary;;   BRONCHIAL WASHINGS  10/20/2019   Procedure: BRONCHIAL WASHINGS;  Surgeon: Leslye Peer, MD;  Location: [facility] ENDOSCOPY;  Service: Pulmonary;;   CARPAL TUNNEL RELEASE Right 2011   Dr. Amanda Pea   COLPORRHAPHY     posterior   HAND SURGERY Right 2016   Nerve surgery, Dr. Amanda Pea   OVARIAN CYST REMOVAL     RECTOCELE REPAIR  2011   w/TVH and sling   TONSILLECTOMY     TONSILLECTOMY     TOTAL KNEE ARTHROPLASTY Left 09/09/2018   Procedure: TOTAL KNEE ARTHROPLASTY, CORTISONE INJECTION RIGHT KNEE;  Surgeon: Durene Romans, MD;  Location: [facility] ORS;  Service: Orthopedics;  Laterality: Left;  70 mins   TOTAL VAGINAL HYSTERECTOMY  10/18/2009   rectocele repair, sling   TUBAL LIGATION Bilateral    VARICOSE VEIN SURGERY     VIDEO BRONCHOSCOPY WITH ENDOBRONCHIAL NAVIGATION N/A 10/20/2019   Procedure: VIDEO BRONCHOSCOPY WITH ENDOBRONCHIAL NAVIGATION;  Surgeon: Leslye Peer, MD;  Location: [facility] ENDOSCOPY;  Service: Pulmonary;  Laterality: N/A;   Patient Active Problem List   Diagnosis Date Noted   Allergic rhinitis 10/19/2020   Asthma 10/19/2020   Cough 08/09/2020   Drug-induced diarrhea 02/22/2020   Adenocarcinoma of left lung, stage 3 (HCC) 11/03/2019   Encounter for antineoplastic chemotherapy 11/03/2019   Goals of care, counseling/discussion 11/03/2019   Pneumothorax, left 10/20/2019   Mass of left lung 10/16/2019   History of adenomatous polyp of colon 09/29/2018   Diverticulosis 09/29/2018   Internal hemorrhoids 09/29/2018   S/P left TKA 09/09/2018   Status post total left knee replacement 09/09/2018   Pain of left heel 07/03/2017   Pain in joint of right hip 05/17/2017   Benign essential  hypertension 11/17/2015   Abnormal weight gain 11/17/2015   Obesity, Class I, BMI 30-34.9 11/17/2015   Gastroesophageal reflux disease without esophagitis 10/12/2014   Hypertriglyceridemia 10/12/2014   Hypertensive disorder 05/21/2013   Hypothyroidism 05/21/2013   Vitamin D deficiency 05/21/2013    PCP: Assunta Found, MDRef Provider (PCP)   REFERRING PROVIDER:   Pincus Badder, FNP    REFERRING DIAG:  Free Text Diagnosis  LBP    Rationale for Evaluation and Treatment: Rehabilitation  THERAPY DIAG:  Difficulty in walking, not elsewhere classified  Muscle weakness (generalized)  Right knee pain, unspecified chronicity  Other low back pain  ONSET DATE: January 16, 2023    SUBJECTIVE:                                                                                                                                                                                           SUBJECTIVE STATEMENT:  Pt states she has been picking things off the floor this morning so her back really has flared up.  She is unable to lift properly due to significant OA in her knee.    Patient is scheduled for right TKR on 04/16/23    PERTINENT HISTORY:  Terminal lung cancer for 3 years +; controlled with medicine TOTAL KNEE ARTHROPLASTY Left 09/09/2018   Asthma     PAIN:  Are you having pain? Yes: NPRS scale: 6/10 pt has a patch on her back  Pain location: across lumbar  Pain description: constant Aggravating factors: bending, ADLS, lifting  Relieving factors: lidocaine patch  PRECAUTIONS: None  RED FLAGS: None   WEIGHT BEARING RESTRICTIONS: No  FALLS:  Has patient fallen in last 6 months? No  LIVING ENVIRONMENT: Lives with: lives with their spouse Lives in: House/apartment Stairs: Yes: External: 1 steps; can reach both Has following equipment at home: Single point cane,  Walker - 2 wheeled, Tour manager, and Grab bars  OCCUPATION: Barista- retired; Geographical information systems officer of [employer]   PLOF: Independent  PATIENT GOALS: To walk pain   NEXT MD VISIT: Jan 29    OBJECTIVE:   DIAGNOSTIC FINDINGS:  IMPRESSION: Bilateral hip degenerative changes.  No acute osseous abnormalities.    SCREENING FOR RED FLAGS: Bowel or bladder incontinence: No Spinal tumors: No Cauda equina syndrome: No Compression fracture: No Abdominal aneurysm: No  COGNITION: Overall cognitive status: Within functional limits for tasks assessed  POSTURE: decreased lumbar lordosis, increased thoracic kyphosis, and flexed trunk       FUNCTIONAL TESTS:  5 times sit to stand: 20.94s with no upper extremity assist, PT stand-by assist  04/12/23:  5 sit to stand 13.71  20-29 years: 6.0  1.4 seconds 30-39 years: 6.1  1.4 seconds 40-49 years: 7.6  1.8 seconds 50-59 years: 7.7  2.6 seconds 60-69 years: 8.4  0.0 seconds (female), 12.7  1.8 seconds (female) 70-79 years: 11.6  3.4 seconds (female), 13.0  4.8 seconds (female) 80-89 years: 16.7  4.5 seconds (female), 17.2  5.5 seconds (female) 90+ years: 19.5  2.3 seconds (female), 22.9  9.6 seconds (female)  The Minimal Clinically Important Difference (MCID) is 2.3 seconds.  A score of 16 seconds or less indicates that the participant is not likely to fall.  A score of more than 16 seconds indicates a higher risk of falls  As per American Physical Therapy Association (APTA) website      GAIT ANALYSIS:  Distance walked: 31ft Assistive device utilized: None Level of assistance: Complete Independence Comments: moderate antalgia right lower extremity; decreased cadence/step length   SENSATION: [facility]    LOWER EXTREMITY MMT:    MMT Right eval Right  2/21/5 Left eval Left  04/12/23  Hip flexion 3/5  4+/5 3/5  5/5  Hip extension  3+ 5/5  3+ 4-/5  Hip abduction 3+/5  4+/5 3+/5  3+/5  Hip adduction        Hip internal rotation        Hip external rotation        Knee flexion 3+/5 5/5 5/5 3+/5  5/5  Knee extension 4-/5  4+/5  4-/5  5/5  Ankle dorsiflexion        Ankle plantarflexion        Ankle inversion        Ankle eversion         (Blank rows = not tested)  LOWER EXTREMITY ROM:     Active  Right eval Left eval  Hip flexion WFL   Hip extension    Hip abduction    Hip adduction    Hip internal rotation [facility] WFL  Hip external rotation [facility]  Knee flexion 0-120   Knee extension WFL   Ankle dorsiflexion    Ankle plantarflexion    Ankle inversion    Ankle eversion     (Blank rows = not tested)  LOWER EXTREMITY SPECIAL TESTS  Luisa Hart (FABER) test: positive on right lower extremity    PALPATION: Moderate tenderness to palpation bilateral lumbar paraspinals  INTEGUMENTARY   [facility]  03/21/23 326 ft walk in 2 minutes pain in knee that she is to get a TKR  5/10  04/12/23 2 minutes walk test 456 ft   TODAY'S TREATMENT:  DATE:  04/23/2023 Wall arch x 10 Hip excursion x 3  Theraband green t band: rows, scapular retraction and shoulder extension x 10 each  Tandem stance both with Rt and Lt in front x 10  B knee to chest x 3  Side lying clam x 15 B Dead bug x 5 very difficult for pt to coordinate Bridge x 15  Prone: Heel squeeze x 10 Single leg raise x 10  Sitting: Thoracic excursion x 3   04/05/23 Theraband postural exercises: Green Scapular retraction x 10 Rows x 10 Shoulder extension x  10  Wall arch x 10  Squat x 5 Side step x  Rt down long line had to stop due to knee pain; pt is having TKR in two weeks  Supine:  Dead bug x10 Knee to chest 30" x 3 Bed mobility 03/28/23: Standing: Marching 10x 3" with ab set Heel raise 10x Toe raise 10x Abduction 10x  RTB rows10x RTB shoulder extension  Supine: Bridge with GTB 10x 5" Clam with ab set GTB 10x 5" each Marching with GTB around thigh with ab set 10x 5" SLR with ab set and quad set prior raise 10x SKTC 2x  30" Hamstring stretch 2x 30"  Sidelying: Abduction 10x PATIENT EDUCATION:  Education details: HEP as well as proper bed mobility.   Person educated: Patient Education method: Explanation Education comprehension: verbalized understanding  HOME EXERCISE PROGRAM: Access Code: [email or web address] URL: [email or web address] Date: 03/18/2023 Prepared by: Seymour Bars  Exercises - Supine March with Resistance Band  - 1-2 x daily - 3 sets - 10 reps - Hooklying Clamshell with Resistance  - 1-2 x daily - 3 sets - 10 reps  03/21/23 - Seated Correct Posture  - 2 x daily - 7 x weekly - 1 sets - 10 reps - 3-5: hold - Seated Scapular Retraction  - 2 x daily - 7 x weekly - 1 sets - 10 reps - 3-5" hold - Supine Bridge with Resistance Band  - 1-2 x daily - 7 x weekly - 1 sets - 10 reps - 3-5 hold - Supine March  - 1-2 x daily - 7 x weekly - 1 sets - 10 reps - 3-5 hold  Access Code: [email or web address] URL: [email or web address] Date: 03/25/2023 Prepared by: Becky Sax  Exercises - Clam with Resistance  - 1-2 x daily - 7 x weekly - 1 sets - 10 reps - Supine Quad Set  - 2 x daily - 7 x weekly - 1 sets - 10 reps - Active Straight Leg Raise with Quad Set  - 1 x daily - 7 x weekly - 3 sets - 10 reps - Hooklying Single Knee to Chest  - 1-2 x daily - 7 x weekly - 1 sets - 3 reps - 20" hold 2023/04/23 Dead bug  Tandem stance   ASSESSMENT:  CLINICAL IMPRESSION:   Pt reassessed see above.  PT is scheduled for a TKR on her RT knee next week and will be discharged from back therapy at this time as the pt will return for knee therapy.  REHAB POTENTIAL: Good  CLINICAL DECISION MAKING: Stable/uncomplicated  EVALUATION COMPLEXITY: Low     GOALS: Goals reviewed with patient? No  SHORT TERM GOALS: Target date: 2 weeks  1. Patient will be independent with a basic stretching/strengthening HEP  Baseline:  Goal status: met    LONG TERM GOALS: Target date: 4 weeks  Patient will walk  >/= 200 feet on the with 1-2/10 pain in  ADL completion, home/community ambulation, and lifting/bending/squatting. Baseline:  Goal status: met   2.  Patient will complete the 5 times sit to stand:    within 16s to demonstrate an improvement in ADL completion, stair negotiation, household/community ambulation, and self-care Baseline:  Goal status: met   3.  Patient will be independent with a comprehensive strengthening HEP  Baseline:  Goal status: met     PLAN:  PT FREQUENCY: 2x/week  PT DURATION: 4 weeks  PLANNED INTERVENTIONS: 97110-Therapeutic exercises, 97530- Therapeutic activity, 97112- Neuromuscular re-education, 97535- Self Care, 91478- Manual therapy, [id number]- Gait training, 97014- Electrical stimulation (unattended), [phone]- Electrical stimulation (manual), Patient/Family education, Balance training, Stair training, Taping, Dry Needling, Joint mobilization, Joint manipulation, Spinal manipulation, Cryotherapy, and Moist heat.  PLAN FOR NEXT SESSION: discharge.   Virgina Organ, PT CLT [phone]  04/12/2023, 9:19 AM

## 2023-04-16 ENCOUNTER — Ambulatory Visit (HOSPITAL_COMMUNITY): Payer: Medicare Other | Admitting: Certified Registered"

## 2023-04-16 ENCOUNTER — Other Ambulatory Visit: Payer: Self-pay

## 2023-04-16 ENCOUNTER — Observation Stay (HOSPITAL_COMMUNITY)
Admission: RE | Admit: 2023-04-16 | Discharge: 2023-04-17 | Disposition: A | Discharge status: Home / Self Care | Payer: Medicare Other | Source: Ambulatory Visit | Attending: Orthopedic Surgery | Admitting: Orthopedic Surgery

## 2023-04-16 ENCOUNTER — Encounter (HOSPITAL_COMMUNITY)
Admission: RE | Disposition: A | Discharge status: Home / Self Care | Payer: Self-pay | Source: Ambulatory Visit | Attending: Orthopedic Surgery

## 2023-04-16 ENCOUNTER — Encounter (HOSPITAL_COMMUNITY): Payer: Self-pay | Admitting: Orthopedic Surgery

## 2023-04-16 ENCOUNTER — Other Ambulatory Visit: Payer: Self-pay | Admitting: Primary Care

## 2023-04-16 ENCOUNTER — Ambulatory Visit (HOSPITAL_COMMUNITY): Payer: Medicare Other | Admitting: Medical

## 2023-04-16 DIAGNOSIS — Z79899 Other long term (current) drug therapy: Secondary | ICD-10-CM | POA: Diagnosis not present

## 2023-04-16 DIAGNOSIS — I10 Essential (primary) hypertension: Secondary | ICD-10-CM

## 2023-04-16 DIAGNOSIS — E039 Hypothyroidism, unspecified: Secondary | ICD-10-CM | POA: Diagnosis not present

## 2023-04-16 DIAGNOSIS — F419 Anxiety disorder, unspecified: Secondary | ICD-10-CM | POA: Diagnosis not present

## 2023-04-16 DIAGNOSIS — M1711 Unilateral primary osteoarthritis, right knee: Principal | ICD-10-CM | POA: Insufficient documentation

## 2023-04-16 DIAGNOSIS — G8918 Other acute postprocedural pain: Secondary | ICD-10-CM | POA: Diagnosis not present

## 2023-04-16 DIAGNOSIS — Z85118 Personal history of other malignant neoplasm of bronchus and lung: Secondary | ICD-10-CM | POA: Insufficient documentation

## 2023-04-16 DIAGNOSIS — J45909 Unspecified asthma, uncomplicated: Secondary | ICD-10-CM | POA: Insufficient documentation

## 2023-04-16 DIAGNOSIS — Z96651 Presence of right artificial knee joint: Principal | ICD-10-CM

## 2023-04-16 DIAGNOSIS — Z96652 Presence of left artificial knee joint: Secondary | ICD-10-CM | POA: Diagnosis not present

## 2023-04-16 HISTORY — PX: TOTAL KNEE ARTHROPLASTY: SHX125

## 2023-04-16 SURGERY — ARTHROPLASTY, KNEE, TOTAL
Anesthesia: Spinal | Site: Knee | Laterality: Right

## 2023-04-16 MED ORDER — METOPROLOL SUCCINATE ER 50 MG PO TB24: 50.0000 mg | ORAL_TABLET | Freq: Every day | ORAL | Status: DC

## 2023-04-16 MED ORDER — SODIUM CHLORIDE (PF) 0.9 % IJ SOLN
INTRAMUSCULAR | Status: AC
  Filled 2023-04-16: qty 30

## 2023-04-16 MED ORDER — AMISULPRIDE (ANTIEMETIC) 5 MG/2ML IV SOLN: 10.0000 mg | Freq: Once | INTRAVENOUS | Status: DC | PRN

## 2023-04-16 MED ORDER — MELATONIN 5 MG PO TABS
5.0000 mg | ORAL_TABLET | Freq: Every evening | ORAL | Status: DC | PRN
  Administered 2023-04-16: 5 mg via ORAL
  Filled 2023-04-16: qty 1

## 2023-04-16 MED ORDER — DEXAMETHASONE SODIUM PHOSPHATE 10 MG/ML IJ SOLN
10.0000 mg | Freq: Once | INTRAMUSCULAR | Status: AC
  Administered 2023-04-17: 10 mg via INTRAVENOUS
  Filled 2023-04-16: qty 1

## 2023-04-16 MED ORDER — LORATADINE 10 MG PO TABS
10.0000 mg | ORAL_TABLET | Freq: Every day | ORAL | Status: DC
  Administered 2023-04-17: 10 mg via ORAL
  Filled 2023-04-16: qty 1

## 2023-04-16 MED ORDER — CEFAZOLIN SODIUM-DEXTROSE 2-4 GM/100ML-% IV SOLN
2.0000 g | Freq: Four times a day (QID) | INTRAVENOUS | Status: AC
  Administered 2023-04-16 (×2): 2 g via INTRAVENOUS
  Filled 2023-04-16 (×2): qty 100

## 2023-04-16 MED ORDER — SENNA 8.6 MG PO TABS
2.0000 | ORAL_TABLET | Freq: Every day | ORAL | Status: DC
  Administered 2023-04-16: 17.2 mg via ORAL
  Filled 2023-04-16: qty 2

## 2023-04-16 MED ORDER — BUPIVACAINE-EPINEPHRINE (PF) 0.25% -1:200000 IJ SOLN
INTRAMUSCULAR | Status: AC
  Filled 2023-04-16: qty 30

## 2023-04-16 MED ORDER — POVIDONE-IODINE 10 % EX SWAB: 2.0000 | Freq: Once | CUTANEOUS | Status: DC

## 2023-04-16 MED ORDER — PROPOFOL 10 MG/ML IV BOLUS
INTRAVENOUS | Status: DC | PRN
  Administered 2023-04-16: 50 ug/kg/min via INTRAVENOUS

## 2023-04-16 MED ORDER — SODIUM CHLORIDE 0.9% FLUSH
3.0000 mL | INTRAVENOUS | Status: DC | PRN
Start: 2023-04-16 — End: 2023-04-16

## 2023-04-16 MED ORDER — SODIUM CHLORIDE (PF) 0.9 % IJ SOLN
INTRAMUSCULAR | Status: DC | PRN
Start: 2023-04-16 — End: 2023-04-16
  Administered 2023-04-16: 30 mL

## 2023-04-16 MED ORDER — OSIMERTINIB MESYLATE 80 MG PO TABS: 80.0000 mg | ORAL_TABLET | Freq: Every day | ORAL | Status: DC

## 2023-04-16 MED ORDER — OXYCODONE HCL 5 MG PO TABS
ORAL_TABLET | ORAL | Status: AC
Start: 2023-04-16 — End: 2023-04-17
  Filled 2023-04-16: qty 1

## 2023-04-16 MED ORDER — LOPERAMIDE HCL 2 MG PO CAPS: 2.0000 mg | ORAL_CAPSULE | Freq: Four times a day (QID) | ORAL | Status: DC | PRN

## 2023-04-16 MED ORDER — MUPIROCIN 2 % EX OINT: 1.0000 | TOPICAL_OINTMENT | Freq: Two times a day (BID) | CUTANEOUS | 0 refills | Status: AC

## 2023-04-16 MED ORDER — TIOTROPIUM BROMIDE-OLODATEROL 2.5-2.5 MCG/ACT IN AERS: 2.0000 | INHALATION_SPRAY | Freq: Every day | RESPIRATORY_TRACT | 5 refills | Status: DC

## 2023-04-16 MED ORDER — OXYCODONE HCL 5 MG PO TABS
5.0000 mg | ORAL_TABLET | ORAL | Status: DC | PRN
  Administered 2023-04-16 – 2023-04-17 (×2): 5 mg via ORAL
  Administered 2023-04-17: 10 mg via ORAL
  Filled 2023-04-16 (×2): qty 2

## 2023-04-16 MED ORDER — DEXAMETHASONE SODIUM PHOSPHATE 10 MG/ML IJ SOLN
INTRAMUSCULAR | Status: AC
  Filled 2023-04-16: qty 1

## 2023-04-16 MED ORDER — ONDANSETRON HCL 4 MG/2ML IJ SOLN
INTRAMUSCULAR | Status: AC
  Filled 2023-04-16: qty 2

## 2023-04-16 MED ORDER — HYDROMORPHONE HCL 1 MG/ML IJ SOLN
0.5000 mg | INTRAMUSCULAR | Status: DC | PRN
  Administered 2023-04-16 (×2): 0.5 mg via INTRAVENOUS
  Administered 2023-04-17: 1 mg via INTRAVENOUS
  Filled 2023-04-16 (×3): qty 1

## 2023-04-16 MED ORDER — LACTATED RINGERS IV SOLN: INTRAVENOUS | Status: DC

## 2023-04-16 MED ORDER — ONDANSETRON HCL 4 MG PO TABS: 4.0000 mg | ORAL_TABLET | Freq: Four times a day (QID) | ORAL | Status: DC | PRN

## 2023-04-16 MED ORDER — RIVAROXABAN 10 MG PO TABS
10.0000 mg | ORAL_TABLET | Freq: Every day | ORAL | Status: DC
  Administered 2023-04-17: 10 mg via ORAL
  Filled 2023-04-16: qty 1

## 2023-04-16 MED ORDER — ACETAMINOPHEN 500 MG PO TABS
ORAL_TABLET | ORAL | Status: AC
  Filled 2023-04-16: qty 2

## 2023-04-16 MED ORDER — TRANEXAMIC ACID-NACL 1000-0.7 MG/100ML-% IV SOLN
INTRAVENOUS | Status: AC
  Filled 2023-04-16: qty 100

## 2023-04-16 MED ORDER — HYDROMORPHONE HCL 1 MG/ML IJ SOLN
INTRAMUSCULAR | Status: AC
  Filled 2023-04-16: qty 1

## 2023-04-16 MED ORDER — FAMOTIDINE 20 MG PO TABS
40.0000 mg | ORAL_TABLET | Freq: Every day | ORAL | Status: DC
  Administered 2023-04-16: 40 mg via ORAL
  Filled 2023-04-16: qty 2

## 2023-04-16 MED ORDER — TRANEXAMIC ACID-NACL 1000-0.7 MG/100ML-% IV SOLN
1000.0000 mg | Freq: Once | INTRAVENOUS | Status: AC
  Administered 2023-04-16: 1000 mg via INTRAVENOUS

## 2023-04-16 MED ORDER — FENTANYL CITRATE PF 50 MCG/ML IJ SOSY: 25.0000 ug | PREFILLED_SYRINGE | INTRAMUSCULAR | Status: DC | PRN

## 2023-04-16 MED ORDER — 0.9 % SODIUM CHLORIDE (POUR BTL) OPTIME
TOPICAL | Status: DC | PRN
  Administered 2023-04-16: 1000 mL

## 2023-04-16 MED ORDER — PHENYLEPHRINE HCL-NACL 20-0.9 MG/250ML-% IV SOLN
INTRAVENOUS | Status: DC | PRN
  Administered 2023-04-16: 50 ug/min via INTRAVENOUS

## 2023-04-16 MED ORDER — ROSUVASTATIN CALCIUM 5 MG PO TABS
5.0000 mg | ORAL_TABLET | Freq: Every day | ORAL | Status: DC
  Administered 2023-04-17: 5 mg via ORAL
  Filled 2023-04-16: qty 1

## 2023-04-16 MED ORDER — CYCLOBENZAPRINE HCL 10 MG PO TABS
10.0000 mg | ORAL_TABLET | Freq: Three times a day (TID) | ORAL | Status: DC | PRN
  Administered 2023-04-17: 10 mg via ORAL
  Filled 2023-04-16: qty 1

## 2023-04-16 MED ORDER — ROPIVACAINE HCL 5 MG/ML IJ SOLN
INTRAMUSCULAR | Status: DC | PRN
  Administered 2023-04-16: 20 mL via PERINEURAL

## 2023-04-16 MED ORDER — OXYCODONE HCL 5 MG PO TABS
10.0000 mg | ORAL_TABLET | ORAL | Status: DC | PRN
  Administered 2023-04-16: 10 mg via ORAL
  Filled 2023-04-16 (×2): qty 2

## 2023-04-16 MED ORDER — CHLORHEXIDINE GLUCONATE 4 % EX SOLN: 1.0000 | CUTANEOUS | 1 refills | Status: DC

## 2023-04-16 MED ORDER — PANTOPRAZOLE SODIUM 40 MG PO TBEC
40.0000 mg | DELAYED_RELEASE_TABLET | Freq: Every day | ORAL | Status: DC
  Administered 2023-04-17: 40 mg via ORAL
  Filled 2023-04-16: qty 1

## 2023-04-16 MED ORDER — BUPIVACAINE-EPINEPHRINE (PF) 0.25% -1:200000 IJ SOLN
INTRAMUSCULAR | Status: DC | PRN
  Administered 2023-04-16: 30 mL

## 2023-04-16 MED ORDER — ONDANSETRON HCL 4 MG/2ML IJ SOLN: 4.0000 mg | Freq: Four times a day (QID) | INTRAMUSCULAR | Status: DC | PRN

## 2023-04-16 MED ORDER — ALUM & MAG HYDROXIDE-SIMETH 200-200-20 MG/5ML PO SUSP: 30.0000 mL | ORAL | Status: DC | PRN

## 2023-04-16 MED ORDER — CHLORHEXIDINE GLUCONATE 0.12 % MT SOLN
15.0000 mL | Freq: Once | OROMUCOSAL | Status: AC
  Administered 2023-04-16: 15 mL via OROMUCOSAL

## 2023-04-16 MED ORDER — TRANEXAMIC ACID-NACL 1000-0.7 MG/100ML-% IV SOLN
1000.0000 mg | INTRAVENOUS | Status: AC
  Administered 2023-04-16: 1000 mg via INTRAVENOUS
  Filled 2023-04-16: qty 100

## 2023-04-16 MED ORDER — SODIUM CHLORIDE 0.9% FLUSH
3.0000 mL | Freq: Two times a day (BID) | INTRAVENOUS | Status: DC
  Administered 2023-04-16 – 2023-04-17 (×2): 10 mL via INTRAVENOUS

## 2023-04-16 MED ORDER — SODIUM CHLORIDE 0.9% FLUSH: 3.0000 mL | INTRAVENOUS | Status: DC | PRN

## 2023-04-16 MED ORDER — SODIUM CHLORIDE 0.9% FLUSH: 3.0000 mL | Freq: Two times a day (BID) | INTRAVENOUS | Status: DC

## 2023-04-16 MED ORDER — KETOROLAC TROMETHAMINE 30 MG/ML IJ SOLN
INTRAMUSCULAR | Status: DC | PRN
Start: 2023-04-16 — End: 2023-04-16
  Administered 2023-04-16: 30 mg

## 2023-04-16 MED ORDER — PHENYLEPHRINE HCL (PRESSORS) 10 MG/ML IV SOLN
INTRAVENOUS | Status: AC
Start: 2023-04-16 — End: ?
  Filled 2023-04-16: qty 1

## 2023-04-16 MED ORDER — ALBUTEROL SULFATE (2.5 MG/3ML) 0.083% IN NEBU: 2.5000 mg | INHALATION_SOLUTION | Freq: Four times a day (QID) | RESPIRATORY_TRACT | Status: DC | PRN

## 2023-04-16 MED ORDER — METOCLOPRAMIDE HCL 5 MG PO TABS: 5.0000 mg | ORAL_TABLET | Freq: Three times a day (TID) | ORAL | Status: DC | PRN

## 2023-04-16 MED ORDER — FENTANYL CITRATE PF 50 MCG/ML IJ SOSY
100.0000 ug | PREFILLED_SYRINGE | Freq: Once | INTRAMUSCULAR | Status: AC
  Administered 2023-04-16: 50 ug via INTRAVENOUS
  Filled 2023-04-16: qty 2

## 2023-04-16 MED ORDER — SODIUM CHLORIDE 0.9 % IR SOLN
Status: DC | PRN
  Administered 2023-04-16: 1000 mL

## 2023-04-16 MED ORDER — DEXAMETHASONE SODIUM PHOSPHATE 10 MG/ML IJ SOLN
8.0000 mg | Freq: Once | INTRAMUSCULAR | Status: AC
  Administered 2023-04-16: 10 mg via INTRAVENOUS

## 2023-04-16 MED ORDER — ORAL CARE MOUTH RINSE: 15.0000 mL | Freq: Once | OROMUCOSAL | Status: AC

## 2023-04-16 MED ORDER — ACETAMINOPHEN 500 MG PO TABS
1000.0000 mg | ORAL_TABLET | Freq: Four times a day (QID) | ORAL | Status: DC
  Administered 2023-04-16 – 2023-04-17 (×4): 1000 mg via ORAL
  Filled 2023-04-16 (×3): qty 2

## 2023-04-16 MED ORDER — BUPIVACAINE IN DEXTROSE 0.75-8.25 % IT SOLN
INTRATHECAL | Status: DC | PRN
Start: 2023-04-16 — End: 2023-04-16
  Administered 2023-04-16: 1.6 mL via INTRATHECAL

## 2023-04-16 MED ORDER — STERILE WATER FOR IRRIGATION IR SOLN
Status: DC | PRN
  Administered 2023-04-16: 1000 mL

## 2023-04-16 MED ORDER — BISACODYL 10 MG RE SUPP: 10.0000 mg | Freq: Every day | RECTAL | Status: DC | PRN

## 2023-04-16 MED ORDER — METOCLOPRAMIDE HCL 5 MG/ML IJ SOLN: 5.0000 mg | Freq: Three times a day (TID) | INTRAMUSCULAR | Status: DC | PRN

## 2023-04-16 MED ORDER — PHENOL 1.4 % MT LIQD: 1.0000 | OROMUCOSAL | Status: DC | PRN

## 2023-04-16 MED ORDER — POLYETHYLENE GLYCOL 3350 17 G PO PACK
17.0000 g | PACK | Freq: Two times a day (BID) | ORAL | Status: DC
  Administered 2023-04-17: 17 g via ORAL
  Filled 2023-04-16 (×2): qty 1

## 2023-04-16 MED ORDER — KETOROLAC TROMETHAMINE 30 MG/ML IJ SOLN
INTRAMUSCULAR | Status: AC
  Filled 2023-04-16: qty 1

## 2023-04-16 MED ORDER — CEFAZOLIN SODIUM-DEXTROSE 2-4 GM/100ML-% IV SOLN
2.0000 g | INTRAVENOUS | Status: AC
  Administered 2023-04-16: 2 g via INTRAVENOUS
  Filled 2023-04-16: qty 100

## 2023-04-16 MED ORDER — ONDANSETRON HCL 4 MG/2ML IJ SOLN
INTRAMUSCULAR | Status: DC | PRN
  Administered 2023-04-16: 4 mg via INTRAVENOUS

## 2023-04-16 MED ORDER — MIRABEGRON ER 25 MG PO TB24
50.0000 mg | ORAL_TABLET | Freq: Every day | ORAL | Status: DC
  Administered 2023-04-17: 50 mg via ORAL
  Filled 2023-04-16: qty 2

## 2023-04-16 MED ORDER — FLUTICASONE PROPIONATE 50 MCG/ACT NA SUSP: 1.0000 | NASAL | Status: DC | PRN

## 2023-04-16 MED ORDER — VANCOMYCIN HCL IN DEXTROSE 1-5 GM/200ML-% IV SOLN
1000.0000 mg | Freq: Once | INTRAVENOUS | Status: AC
  Administered 2023-04-16: 1000 mg via INTRAVENOUS
  Filled 2023-04-16: qty 200

## 2023-04-16 MED ORDER — PHENYLEPHRINE HCL (PRESSORS) 10 MG/ML IV SOLN
INTRAVENOUS | Status: AC
  Filled 2023-04-16: qty 1

## 2023-04-16 MED ORDER — LEVOTHYROXINE SODIUM 100 MCG PO TABS
100.0000 ug | ORAL_TABLET | Freq: Every day | ORAL | Status: DC
  Administered 2023-04-17: 100 ug via ORAL
  Filled 2023-04-16: qty 1

## 2023-04-16 MED ORDER — CLONIDINE HCL (ANALGESIA) 100 MCG/ML EP SOLN
EPIDURAL | Status: DC | PRN
  Administered 2023-04-16: 50 ug

## 2023-04-16 MED ORDER — MENTHOL 3 MG MT LOZG: 1.0000 | LOZENGE | OROMUCOSAL | Status: DC | PRN

## 2023-04-16 MED ORDER — LORAZEPAM 0.5 MG PO TABS
0.5000 mg | ORAL_TABLET | Freq: Every day | ORAL | Status: DC
  Administered 2023-04-16: 0.5 mg via ORAL
  Filled 2023-04-16: qty 1

## 2023-04-16 MED ORDER — DIPHENHYDRAMINE HCL 12.5 MG/5ML PO ELIX: 12.5000 mg | ORAL_SOLUTION | ORAL | Status: DC | PRN

## 2023-04-16 MED ORDER — MIDAZOLAM HCL 2 MG/2ML IJ SOLN
2.0000 mg | Freq: Once | INTRAMUSCULAR | Status: DC
  Filled 2023-04-16: qty 2

## 2023-04-16 SURGICAL SUPPLY — 44 items
ATTUNE MED ANAT PAT 35 KNEE (Knees) IMPLANT
ATTUNE PSFEM RTSZ5 NARCEM KNEE (Femur) IMPLANT
ATTUNE PSRP INSR SZ5 5 KNEE (Insert) IMPLANT
BAG COUNTER SPONGE SURGICOUNT (BAG) IMPLANT
BAG ZIPLOCK 12X15 (MISCELLANEOUS) IMPLANT
BASEPLATE TIBIAL ROTATING SZ 4 (Knees) IMPLANT
BLADE SAW SGTL 13.0X1.19X90.0M (BLADE) ×1 IMPLANT
BNDG ELASTIC 6INX 5YD STR LF (GAUZE/BANDAGES/DRESSINGS) ×1 IMPLANT
BOWL SMART MIX CTS (DISPOSABLE) ×1 IMPLANT
CEMENT HV SMART SET (Cement) ×2 IMPLANT
COVER SURGICAL LIGHT HANDLE (MISCELLANEOUS) ×1 IMPLANT
CUFF TRNQT CYL 34X4.125X (TOURNIQUET CUFF) ×1 IMPLANT
DERMABOND ADVANCED .7 DNX12 (GAUZE/BANDAGES/DRESSINGS) ×1 IMPLANT
DRAPE U-SHAPE 47X51 STRL (DRAPES) ×1 IMPLANT
DRESSING AQUACEL AG SP 3.5X10 (GAUZE/BANDAGES/DRESSINGS) ×1 IMPLANT
DRSG AQUACEL AG SP 3.5X10 (GAUZE/BANDAGES/DRESSINGS) ×1 IMPLANT
DURAPREP 26ML APPLICATOR (WOUND CARE) ×2 IMPLANT
ELECT REM PT RETURN 15FT ADLT (MISCELLANEOUS) ×1 IMPLANT
GLOVE BIO SURGEON STRL SZ 6 (GLOVE) ×1 IMPLANT
GLOVE BIOGEL PI IND STRL 6.5 (GLOVE) ×1 IMPLANT
GLOVE BIOGEL PI IND STRL 7.5 (GLOVE) ×1 IMPLANT
GLOVE ORTHO TXT STRL SZ7.5 (GLOVE) ×2 IMPLANT
GOWN STRL REUS W/ TWL LRG LVL3 (GOWN DISPOSABLE) ×2 IMPLANT
HOLDER FOLEY CATH W/STRAP (MISCELLANEOUS) IMPLANT
KIT TURNOVER KIT A (KITS) IMPLANT
MANIFOLD NEPTUNE II (INSTRUMENTS) ×1 IMPLANT
NDL SAFETY ECLIPSE 18X1.5 (NEEDLE) IMPLANT
NS IRRIG 1000ML POUR BTL (IV SOLUTION) ×1 IMPLANT
PACK TOTAL KNEE CUSTOM (KITS) ×1 IMPLANT
PIN FIX SIGMA LCS THRD HI (PIN) IMPLANT
PROTECTOR NERVE ULNAR (MISCELLANEOUS) ×1 IMPLANT
SET HNDPC FAN SPRY TIP SCT (DISPOSABLE) ×1 IMPLANT
SET PAD KNEE POSITIONER (MISCELLANEOUS) ×1 IMPLANT
SPIKE FLUID TRANSFER (MISCELLANEOUS) ×2 IMPLANT
SUT MNCRL AB 4-0 PS2 18 (SUTURE) ×1 IMPLANT
SUT STRATAFIX PDS+ 0 24IN (SUTURE) ×1 IMPLANT
SUT VIC AB 1 CT1 36 (SUTURE) ×1 IMPLANT
SUT VIC AB 2-0 CT1 TAPERPNT 27 (SUTURE) ×2 IMPLANT
SYR 3ML LL SCALE MARK (SYRINGE) ×1 IMPLANT
TOWEL GREEN STERILE FF (TOWEL DISPOSABLE) ×1 IMPLANT
TRAY FOLEY MTR SLVR 16FR STAT (SET/KITS/TRAYS/PACK) ×1 IMPLANT
TUBE SUCTION HIGH CAP CLEAR NV (SUCTIONS) ×1 IMPLANT
WATER STERILE IRR 1000ML POUR (IV SOLUTION) ×2 IMPLANT
WRAP KNEE MAXI GEL POST OP (GAUZE/BANDAGES/DRESSINGS) ×1 IMPLANT

## 2023-04-16 NOTE — Interval H&P Note (Signed)
 History and Physical Interval Note:  04/16/2023 8:43 AM  Linda Woods  has presented today for surgery, with the diagnosis of Right Knee Osteoarthritis.  The various methods of treatment have been discussed with the patient and family. After consideration of risks, benefits and other options for treatment, the patient has consented to  Procedure(s): TOTAL KNEE ARTHROPLASTY (Right) as a surgical intervention.  The patient's history has been reviewed, patient examined, no change in status, stable for surgery.  I have reviewed the patient's chart and labs.  Questions were answered to the patient's satisfaction.     [name]

## 2023-04-16 NOTE — Evaluation (Signed)
 Physical Therapy Evaluation Patient Details Name: Linda Woods MRN: [id number] DOB: [birth year] Today's Date: 04/16/2023  History of Present Illness  73 yo female presents to therapy s/p R TKA on 04/16/2023 due to failure of conservative measures. Pt PMH includes but is not limited to: Lung ca, asthma, diverticulosis, L TKA (2020), GERD, HLD, HTN, hypothyroid, and  RSL.  Clinical Impression  Linda Woods is a 73 y.o. female POD 0 s/p R TKA. Patient reports IND with mobility at baseline. Patient is now limited by functional impairments (see PT problem list below) and requires S for bed mobility and CGA and cues for transfers. Patient was able to ambulate 20 feet with RW and CGA level of assist. Patient instructed in exercise to facilitate ROM and circulation to manage edema. Patient will benefit from continued skilled PT interventions to address impairments and progress towards PLOF. Acute PT will follow to progress mobility and stair training in preparation for safe discharge home with family support and OPPT services.     If plan is discharge home, recommend the following: A little help with walking and/or transfers;A little help with bathing/dressing/bathroom;Assistance with cooking/housework;Assist for transportation;Help with stairs or ramp for entrance   Can travel by private vehicle        Equipment Recommendations None recommended by PT  Recommendations for Other Services       Functional Status Assessment Patient has had a recent decline in their functional status and demonstrates the ability to make significant improvements in function in a reasonable and predictable amount of time.     Precautions / Restrictions Precautions Precautions: Fall;Knee Restrictions Weight Bearing Restrictions Per Provider Order: No      Mobility  Bed Mobility Overal bed mobility: Needs Assistance Bed Mobility: Supine to Sit     Supine to sit: Supervision, HOB elevated      General bed mobility comments: min cues    Transfers Overall transfer level: Needs assistance Equipment used: Rolling walker (2 wheels) Transfers: Sit to/from Stand Sit to Stand: Contact guard assist           General transfer comment: min cues pt able to push to stand with L UE    Ambulation/Gait Ambulation/Gait assistance: Contact guard assist Gait Distance (Feet): 20 Feet Assistive device: Rolling walker (2 wheels) Gait Pattern/deviations: Step-to pattern, Trunk flexed, Antalgic Gait velocity: decreased     General Gait Details: trunk flexion with B UE support at RW to offload R LE in stance phase, min cues for safety and RW management  Stairs            Wheelchair Mobility     Tilt Bed    Modified Rankin (Stroke Patients Only)       Balance Overall balance assessment: Needs assistance Sitting-balance support: Feet supported Sitting balance-Leahy Scale: Good     Standing balance support: Bilateral upper extremity supported, During functional activity, Reliant on assistive device for balance Standing balance-Leahy Scale: Poor                               Pertinent Vitals/Pain Pain Assessment Pain Assessment: 0-10 Pain Score: 6  Pain Location: R knee Pain Descriptors / Indicators: Aching, Constant, Discomfort, Grimacing, Operative site guarding Pain Intervention(s): Limited activity within patient's tolerance, Monitored during session, Premedicated before session, Repositioned, Ice applied, Patient requesting pain meds-RN notified    Home Living Family/patient expects to be discharged to:: Private residence Living Arrangements: Spouse/significant other  Available Help at Discharge: Family Type of Home: House Home Access: Stairs to enter Entrance Stairs-Rails: None Entrance Stairs-Number of Steps: 1 + 1 (2 nonsequential steps)   Home Layout: Multi-level;Able to live on main level with bedroom/bathroom Home Equipment: Rolling Walker  (2 wheels);Cane - single point;Cane - quad;Shower seat - built in      Prior Function Prior Level of Function : Independent/Modified Independent             Mobility Comments: IND no AD for all ADLs, self care tasks and IADLs       Extremity/Trunk Assessment        Lower Extremity Assessment Lower Extremity Assessment: RLE deficits/detail RLE Deficits / Details: ankle DF 5/5, PF 4/5 : SLR , 10 degree lag RLE Sensation: history of peripheral neuropathy    Cervical / Trunk Assessment Cervical / Trunk Assessment: Normal  Communication   Communication Communication: No apparent difficulties    Cognition Arousal: Alert Behavior During Therapy: WFL for tasks assessed/performed   PT - Cognitive impairments: No apparent impairments                         Following commands: Intact       Cueing       General Comments      Exercises Total Joint Exercises Ankle Circles/Pumps: AROM, Both, 10 reps   Assessment/Plan    PT Assessment Patient needs continued PT services  PT Problem List Decreased strength;Decreased range of motion;Decreased activity tolerance;Decreased balance;Decreased mobility;Decreased coordination;Pain       PT Treatment Interventions DME instruction;Gait training;Stair training;Functional mobility training;Therapeutic activities;Therapeutic exercise;Balance training;Neuromuscular re-education;Patient/family education;Modalities    PT Goals (Current goals can be found in the Care Plan section)  Acute Rehab PT Goals Patient Stated Goal: to be able to get back to my life and go home PT Goal Formulation: With patient Time For Goal Achievement: 04/30/23 Potential to Achieve Goals: Good    Frequency 7X/week     Co-evaluation               AM-PAC PT "6 Clicks" Mobility  Outcome Measure Help needed turning from your back to your side while in a flat bed without using bedrails?: None Help needed moving from lying on your back  to sitting on the side of a flat bed without using bedrails?: A Little Help needed moving to and from a bed to a chair (including a wheelchair)?: A Little Help needed standing up from a chair using your arms (e.g., wheelchair or bedside chair)?: A Little Help needed to walk in hospital room?: A Little Help needed climbing 3-5 steps with a railing? : A Lot 6 Click Score: 18    End of Session Equipment Utilized During Treatment: Gait belt Activity Tolerance: Patient limited by pain Patient left: in chair;with call bell/phone within reach Nurse Communication: Mobility status;Patient requests pain meds PT Visit Diagnosis: Unsteadiness on feet (R26.81);Other abnormalities of gait and mobility (R26.89);Muscle weakness (generalized) (M62.81);Pain;Difficulty in walking, not elsewhere classified (R26.2) Pain - Right/Left: Right Pain - part of body: Knee;Leg    Time: 9604-5409 PT Time Calculation (min) (ACUTE ONLY): 18 min   Charges:   PT Evaluation $PT Eval Low Complexity: 1 Low   PT General Charges $$ ACUTE PT VISIT: 1 Visit         Johnny Bridge, PT Acute Rehab   Jacqualyn Posey 04/16/2023, 7:10 PM

## 2023-04-16 NOTE — Progress Notes (Signed)
 Pt arrived from PACU for continued medical treatment, alert and oriented on room air, husband at bedside.

## 2023-04-16 NOTE — Plan of Care (Signed)

## 2023-04-16 NOTE — Discharge Instructions (Addendum)
INSTRUCTIONS AFTER JOINT REPLACEMENT   Remove items at home which could result in a fall. This includes throw rugs or furniture in walking pathways ICE to the affected joint every three hours while awake for 30 minutes at a time, for at least the first 3-5 days, and then as needed for pain and swelling.  Continue to use ice for pain and swelling. You may notice swelling that will progress down to the foot and ankle.  This is normal after surgery.  Elevate your leg when you are not up walking on it.   Continue to use the breathing machine you got in the hospital (incentive spirometer) which will help keep your temperature down.  It is common for your temperature to cycle up and down following surgery, especially at night when you are not up moving around and exerting yourself.  The breathing machine keeps your lungs expanded and your temperature down.   DIET:  As you were doing prior to hospitalization, we recommend a well-balanced diet.  DRESSING / WOUND CARE / SHOWERING  Keep the surgical dressing until follow up.  The dressing is water proof, so you can shower without any extra covering.  IF THE DRESSING FALLS OFF or the wound gets wet inside, change the dressing with sterile gauze.  Please use good hand washing techniques before changing the dressing.  Do not use any lotions or creams on the incision until instructed by your surgeon.    ACTIVITY  Increase activity slowly as tolerated, but follow the weight bearing instructions below.   No driving for 6 weeks or until further direction given by your physician.  You cannot drive while taking narcotics.  No lifting or carrying greater than 10 lbs. until further directed by your surgeon. Avoid periods of inactivity such as sitting longer than an hour when not asleep. This helps prevent blood clots.  You may return to work once you are authorized by your doctor.     WEIGHT BEARING   Weight bearing as tolerated with assist device (walker, cane,  etc) as directed, use it as long as suggested by your surgeon or therapist, typically at least 4-6 weeks.   EXERCISES  Results after joint replacement surgery are often greatly improved when you follow the exercise, range of motion and muscle strengthening exercises prescribed by your doctor. Safety measures are also important to protect the joint from further injury. Any time any of these exercises cause you to have increased pain or swelling, decrease what you are doing until you are comfortable again and then slowly increase them. If you have problems or questions, call your caregiver or physical therapist for advice.   Rehabilitation is important following a joint replacement. After just a few days of immobilization, the muscles of the leg can become weakened and shrink (atrophy).  These exercises are designed to build up the tone and strength of the thigh and leg muscles and to improve motion. Often times heat used for twenty to thirty minutes before working out will loosen up your tissues and help with improving the range of motion but do not use heat for the first two weeks following surgery (sometimes heat can increase post-operative swelling).   These exercises can be done on a training (exercise) mat, on the floor, on a table or on a bed. Use whatever works the best and is most comfortable for you.    Use music or television while you are exercising so that the exercises are a pleasant break in your   day. This will make your life better with the exercises acting as a break in your routine that you can look forward to.   Perform all exercises about fifteen times, three times per day or as directed.  You should exercise both the operative leg and the other leg as well.  Exercises include:   Quad Sets - Tighten up the muscle on the front of the thigh (Quad) and hold for 5-10 seconds.   Straight Leg Raises - With your knee straight (if you were given a brace, keep it on), lift the leg to 60  degrees, hold for 3 seconds, and slowly lower the leg.  Perform this exercise against resistance later as your leg gets stronger.  Leg Slides: Lying on your back, slowly slide your foot toward your buttocks, bending your knee up off the floor (only go as far as is comfortable). Then slowly slide your foot back down until your leg is flat on the floor again.  Angel Wings: Lying on your back spread your legs to the side as far apart as you can without causing discomfort.  Hamstring Strength:  Lying on your back, push your heel against the floor with your leg straight by tightening up the muscles of your buttocks.  Repeat, but this time bend your knee to a comfortable angle, and push your heel against the floor.  You may put a pillow under the heel to make it more comfortable if necessary.   A rehabilitation program following joint replacement surgery can speed recovery and prevent re-injury in the future due to weakened muscles. Contact your doctor or a physical therapist for more information on knee rehabilitation.    CONSTIPATION  Constipation is defined medically as fewer than three stools per week and severe constipation as less than one stool per week.  Even if you have a regular bowel pattern at home, your normal regimen is likely to be disrupted due to multiple reasons following surgery.  Combination of anesthesia, postoperative narcotics, change in appetite and fluid intake all can affect your bowels.   YOU MUST use at least one of the following options; they are listed in order of increasing strength to get the job done.  They are all available over the counter, and you may need to use some, POSSIBLY even all of these options:    Drink plenty of fluids (prune juice may be helpful) and high fiber foods Colace 100 mg by mouth twice a day  Senokot for constipation as directed and as needed Dulcolax (bisacodyl), take with full glass of water  Miralax (polyethylene glycol) once or twice a day as  needed.  If you have tried all these things and are unable to have a bowel movement in the first 3-4 days after surgery call either your surgeon or your primary doctor.    If you experience loose stools or diarrhea, hold the medications until you stool forms back up.  If your symptoms do not get better within 1 week or if they get worse, check with your doctor.  If you experience "the worst abdominal pain ever" or develop nausea or vomiting, please contact the office immediately for further recommendations for treatment.   ITCHING:  If you experience itching with your medications, try taking only a single pain pill, or even half a pain pill at a time.  You can also use Benadryl over the counter for itching or also to help with sleep.   TED HOSE STOCKINGS:  Use stockings on both   legs until for at least 2 weeks or as directed by physician office. They may be removed at night for sleeping.  MEDICATIONS:  See your medication summary on the "After Visit Summary" that nursing will review with you.  You may have some home medications which will be placed on hold until you complete the course of blood thinner medication.  It is important for you to complete the blood thinner medication as prescribed.  PRECAUTIONS:  If you experience chest pain or shortness of breath - call 911 immediately for transfer to the hospital emergency department.   If you develop a fever greater that 101 F, purulent drainage from wound, increased redness or drainage from wound, foul odor from the wound/dressing, or calf pain - CONTACT YOUR SURGEON.                                                   FOLLOW-UP APPOINTMENTS:  If you do not already have a post-op appointment, please call the office for an appointment to be seen by your surgeon.  Guidelines for how soon to be seen are listed in your "After Visit Summary", but are typically between 1-4 weeks after surgery.  OTHER INSTRUCTIONS:   Knee Replacement:  Do not place pillow  under knee, focus on keeping the knee straight while resting. CPM instructions: 0-90 degrees, 2 hours in the morning, 2 hours in the afternoon, and 2 hours in the evening. Place foam block, curve side up under heel at all times except when in CPM or when walking.  DO NOT modify, tear, cut, or change the foam block in any way.  POST-OPERATIVE OPIOID TAPER INSTRUCTIONS: It is important to wean off of your opioid medication as soon as possible. If you do not need pain medication after your surgery it is ok to stop day one. Opioids include: Codeine, Hydrocodone(Norco, Vicodin), Oxycodone(Percocet, oxycontin) and hydromorphone amongst others.  Long term and even short term use of opiods can cause: Increased pain response Dependence Constipation Depression Respiratory depression And more.  Withdrawal symptoms can include Flu like symptoms Nausea, vomiting And more Techniques to manage these symptoms Hydrate well Eat regular healthy meals Stay active Use relaxation techniques(deep breathing, meditating, yoga) Do Not substitute Alcohol to help with tapering If you have been on opioids for less than two weeks and do not have pain than it is ok to stop all together.  Plan to wean off of opioids This plan should start within one week post op of your joint replacement. Maintain the same interval or time between taking each dose and first decrease the dose.  Cut the total daily intake of opioids by one tablet each day Next start to increase the time between doses. The last dose that should be eliminated is the evening dose.   MAKE SURE YOU:  Understand these instructions.  Get help right away if you are not doing well or get worse.    Thank you for letting us be a part of your medical care team.  It is a privilege we respect greatly.  We hope these instructions will help you stay on track for a fast and full recovery!   Information on my medicine - XARELTO (Rivaroxaban)  This medication  education was reviewed with me or my healthcare representative as part of my discharge preparation.  The pharmacist that spoke   with me during my hospital stay was:    Why was Xarelto prescribed for you? Xarelto was prescribed for you to reduce the risk of blood clots forming after orthopedic surgery. The medical term for these abnormal blood clots is venous thromboembolism (VTE).  What do you need to know about xarelto ? Take your Xarelto ONCE DAILY at the same time every day. You may take it either with or without food.  If you have difficulty swallowing the tablet whole, you may crush it and mix in applesauce just prior to taking your dose.  Take Xarelto exactly as prescribed by your doctor and DO NOT stop taking Xarelto without talking to the doctor who prescribed the medication.  Stopping without other VTE prevention medication to take the place of Xarelto may increase your risk of developing a clot.  After discharge, you should have regular check-up appointments with your healthcare provider that is prescribing your Xarelto.    What do you do if you miss a dose? If you miss a dose, take it as soon as you remember on the same day then continue your regularly scheduled once daily regimen the next day. Do not take two doses of Xarelto on the same day.   Important Safety Information A possible side effect of Xarelto is bleeding. You should call your healthcare provider right away if you experience any of the following: Bleeding from an injury or your nose that does not stop. Unusual colored urine (red or dark brown) or unusual colored stools (red or black). Unusual bruising for unknown reasons. A serious fall or if you hit your head (even if there is no bleeding).  Some medicines may interact with Xarelto and might increase your risk of bleeding while on Xarelto. To help avoid this, consult your healthcare provider or pharmacist prior to using any new prescription or  non-prescription medications, including herbals, vitamins, non-steroidal anti-inflammatory drugs (NSAIDs) and supplements.  This website has more information on Xarelto: www.xarelto.com.     

## 2023-04-16 NOTE — Anesthesia Procedure Notes (Signed)
 Spinal  Patient location during procedure: OR Start time: 04/16/2023 9:52 AM End time: 04/16/2023 9:57 AM Reason for block: surgical anesthesia Staffing Performed: anesthesiologist  Anesthesiologist: Marcene Duos, MD Performed by: Marcene Duos, MD Authorized by: Marcene Duos, MD   Preanesthetic Checklist Completed: patient identified, IV checked, site marked, risks and benefits discussed, surgical consent, monitors and equipment checked, pre-op evaluation and timeout performed Spinal Block Patient position: sitting Prep: DuraPrep Patient monitoring: heart rate, cardiac monitor, continuous pulse ox and blood pressure Approach: midline Location: L3-4 Injection technique: single-shot Needle Needle type: Pencan  Needle gauge: 24 G Needle length: 9 cm Assessment Sensory level: T4 Events: CSF return

## 2023-04-16 NOTE — Anesthesia Procedure Notes (Signed)
 Anesthesia Regional Block: Adductor canal block   Pre-Anesthetic Checklist: , timeout performed,  Correct Patient, Correct Site, Correct Laterality,  Correct Procedure, Correct Position, site marked,  Risks and benefits discussed,  Surgical consent,  Pre-op evaluation,  At surgeon's request and post-op pain management  Laterality: Right  Prep: chloraprep       Needles:  Injection technique: Single-shot  Needle Type: Echogenic Needle     Needle Length: 9cm  Needle Gauge: 21     Additional Needles:   Procedures:,,,, ultrasound used (permanent image in chart),,    Narrative:  Start time: 04/16/2023 9:20 AM End time: 04/16/2023 9:26 AM Injection made incrementally with aspirations every 5 mL.  Performed by: Personally  Anesthesiologist: Marcene Duos, MD

## 2023-04-16 NOTE — Transfer of Care (Signed)
 Immediate Anesthesia Transfer of Care Note  Patient: Draya Felker  Procedure(s) Performed: TOTAL KNEE ARTHROPLASTY (Right: Knee)  Patient Location: PACU  Anesthesia Type:MAC combined with regional for post-op pain  Level of Consciousness: awake, alert , oriented, and patient cooperative  Airway & Oxygen Therapy: Patient Spontanous Breathing and Patient connected to face mask oxygen  Post-op Assessment: Report given to RN and Post -op Vital signs reviewed and stable  Post vital signs: stable  Last Vitals:  Vitals Value Taken Time  BP 95/78 04/16/23 1124  Temp    Pulse 94 04/16/23 1124  Resp 12 04/16/23 1124  SpO2 100 % 04/16/23 1124  Vitals shown include unfiled device data.  Last Pain:  Vitals:   04/16/23 0757  TempSrc: Oral  PainSc:          Complications: No notable events documented.

## 2023-04-16 NOTE — Anesthesia Postprocedure Evaluation (Signed)
 Anesthesia Post Note  Patient: Aiko Belko  Procedure(s) Performed: TOTAL KNEE ARTHROPLASTY (Right: Knee)     Patient location during evaluation: PACU Anesthesia Type: Spinal Level of consciousness: awake and alert Pain management: pain level controlled Vital Signs Assessment: post-procedure vital signs reviewed and stable Respiratory status: spontaneous breathing and respiratory function stable Cardiovascular status: blood pressure returned to baseline and stable Postop Assessment: spinal receding Anesthetic complications: no  No notable events documented.  Last Vitals:  Vitals:   04/16/23 1215 04/16/23 1230  BP: 94/61 97/62  Pulse: 86 75  Resp: 14 11  Temp:    SpO2: 94% 93%    Last Pain:  Vitals:   04/16/23 1130  TempSrc:   PainSc: 0-No pain    LLE Motor Response: No movement due to regional block (04/16/23 1230) LLE Sensation: Numbness (04/16/23 1230) RLE Motor Response: No movement due to regional block (04/16/23 1230) RLE Sensation: Numbness (04/16/23 1230) L Sensory Level: L3-Anterior knee, lower leg (04/16/23 1230) R Sensory Level: L3-Anterior knee, lower leg (04/16/23 1230)  Kennieth Rad

## 2023-04-16 NOTE — Op Note (Signed)
 NAME:  Linda Woods                      MEDICAL RECORD NO.:  [id number]                             FACILITY:  [facility]      PHYSICIAN:  Linda Woods, M.D.  DATE OF BIRTH:  15-Sep-1950      DATE OF PROCEDURE:  04/16/2023                                     OPERATIVE REPORT         PREOPERATIVE DIAGNOSIS:  Right knee osteoarthritis.      POSTOPERATIVE DIAGNOSIS:  Right knee osteoarthritis.      FINDINGS:  The patient was noted to have complete loss of cartilage and   bone-on-bone arthritis with associated osteophytes in the lateral and patellofemoral compartments of   the knee with a significant synovitis and associated effusion.  The patient had failed months of conservative treatment including medications, injection therapy, activity modification.     PROCEDURE:  Right total knee replacement.      COMPONENTS USED:  DePuy Attune RP posterior stabilized knee   system, a size 5N femur, 4 tibia, size 5 mm PS AOX insert, and 35 anatomic patellar   button.      SURGEON:  Linda Woods, M.D.      ASSISTANT:  Linda Billings, PA-C.      ANESTHESIA:  Regional and Spinal.      SPECIMENS:  None.      COMPLICATION:  None.      DRAINS:  None.  EBL: <200 cc      TOURNIQUET TIME:  24 min at 225 mmHg     The patient was stable to the recovery room.      INDICATION FOR PROCEDURE:  Linda Woods is a 73 y.o. female patient of   mine.  The patient had been seen, evaluated, and treated for months conservatively in the   office with medication, activity modification, and injections.  The patient had   radiographic changes of bone-on-bone arthritis with endplate sclerosis and osteophytes noted.  Based on the radiographic changes and failed conservative measures, the patient   decided to proceed with definitive treatment, total knee replacement.  Risks of infection, DVT, component failure, need for revision surgery, neurovascular injury were reviewed in the office setting.   The postop course was reviewed stressing the efforts to maximize post-operative satisfaction and function.  Consent was obtained for benefit of pain   relief.      PROCEDURE IN DETAIL:  The patient was brought to the operative theater.   Once adequate anesthesia, preoperative antibiotics, 2 gm of Ancef,1 gm of Tranexamic Acid, and 10 mg of Decadron administered, the patient was positioned supine with a right thigh tourniquet placed.  The  right lower extremity was prepped and draped in sterile fashion.  A time-   out was performed identifying the patient, planned procedure, and the appropriate extremity.      The right lower extremity was placed in the [facility] leg holder.  The leg was   exsanguinated, tourniquet elevated to 225 mmHg.  A midline incision was   made followed by median parapatellar arthrotomy.  Following initial   exposure, attention was first  directed to the patella.  Precut   measurement was noted to be 24 mm.  I resected down to 14 mm and used a   35 anatomic patellar button to restore patellar height as well as cover the cut surface.      The lug holes were drilled and a metal shim was placed to protect the   patella from retractors and saw blade during the procedure.      At this point, attention was now directed to the femur.  The femoral   canal was opened with a drill, irrigated to try to prevent fat emboli.  An   intramedullary rod was passed at 3 degrees valgus, 9 mm of bone was   resected off the distal femur.  Following this resection, the tibia was   subluxated anteriorly.  Using the extramedullary guide, 2 mm of bone was resected off   the proximal lateral tibia.  We confirmed the gap would be   stable medially and laterally with a size 5 spacer block as well as confirmed that the tibial cut was perpendicular in the coronal plane, checking with an alignment rod.      Once this was done, I sized the femur to be a size 5 in the anterior-   posterior dimension,  chose a narrow component based on medial and   lateral dimension.  The size 5 rotation block was then pinned in   position anterior referenced using the C-clamp to set rotation.  The   anterior, posterior, and  chamfer cuts were made without difficulty nor   notching making certain that I was along the anterior cortex to help   with flexion gap stability.      The final box cut was made off the lateral aspect of distal femur.      At this point, the tibia was sized to be a size 4.  The size 4 tray was   then pinned in position through the medial third of the tubercle,   drilled, and keel punched.  Trial reduction was now carried with a 5 femur,  4 tibia, a size 5 mm CR MS insert, and the 35 anatomic patella botton.  The knee was brought to full extension with good flexion stability with the patella   tracking through the trochlea without application of pressure.  Given   all these findings the trial components removed.  Final components were   opened and cement was mixed.  The knee was irrigated with normal saline solution and pulse lavage.  The synovial lining was   then injected with 30 cc of 0.25% Marcaine with epinephrine, 1 cc of Toradol and 30 cc of NS for a total of 61 cc.     Final implants were then cemented onto cleaned and dried cut surfaces of bone with the knee brought to extension with a size 5 mm CR MS trial insert.      Once the cement had fully cured, excess cement was removed   throughout the knee.  I confirmed that I was satisfied with the range of   motion and stability, and the final size 5 mm CR MS AOX insert was chosen.  It was   placed into the knee.      The tourniquet had been let down at 24 minutes.  No significant   hemostasis was required.  The extensor mechanism was then reapproximated using #1 Vicryl and #1 Stratafix sutures with the knee   in flexion.  The   remaining wound was closed with 2-0 Vicryl and running 4-0 Monocryl.   The knee was cleaned, dried,  dressed sterilely using Dermabond and   Aquacel dressing.  The patient was then   brought to recovery room in stable condition, tolerating the procedure   well.   Please note that Physician Assistant, Linda Billings, PA-C was present for the entirety of the case, and was utilized for pre-operative positioning, peri-operative retractor management, general facilitation of the procedure and for primary wound closure at the end of the case.              Linda Frankel Charlann Woods, M.D.    04/16/2023 8:48 AM

## 2023-04-17 ENCOUNTER — Encounter (HOSPITAL_COMMUNITY): Payer: Self-pay | Admitting: Orthopedic Surgery

## 2023-04-17 DIAGNOSIS — Z79899 Other long term (current) drug therapy: Secondary | ICD-10-CM | POA: Diagnosis not present

## 2023-04-17 DIAGNOSIS — I10 Essential (primary) hypertension: Secondary | ICD-10-CM | POA: Diagnosis not present

## 2023-04-17 DIAGNOSIS — M1711 Unilateral primary osteoarthritis, right knee: Secondary | ICD-10-CM | POA: Diagnosis not present

## 2023-04-17 DIAGNOSIS — Z96652 Presence of left artificial knee joint: Secondary | ICD-10-CM | POA: Diagnosis not present

## 2023-04-17 DIAGNOSIS — E039 Hypothyroidism, unspecified: Secondary | ICD-10-CM | POA: Diagnosis not present

## 2023-04-17 DIAGNOSIS — Z85118 Personal history of other malignant neoplasm of bronchus and lung: Secondary | ICD-10-CM | POA: Diagnosis not present

## 2023-04-17 DIAGNOSIS — J45909 Unspecified asthma, uncomplicated: Secondary | ICD-10-CM | POA: Diagnosis not present

## 2023-04-17 LAB — BASIC METABOLIC PANEL
Anion gap: 8 (ref 5–15)
BUN: 13 mg/dL (ref 8–23)
CO2: 23 mmol/L (ref 22–32)
Calcium: 8.4 mg/dL — ABNORMAL LOW (ref 8.9–10.3)
Chloride: 105 mmol/L (ref 98–111)
Creatinine, Ser: 0.87 mg/dL (ref 0.44–1.00)
GFR, Estimated: >60 mL/min (ref >60)
Glucose, Bld: 203 mg/dL — ABNORMAL HIGH (ref 70–99)
Potassium: 4 mmol/L (ref 3.5–5.1)
Sodium: 136 mmol/L (ref 135–145)

## 2023-04-17 LAB — CBC
HCT: 28.4 % — ABNORMAL LOW (ref 36.0–46.0)
Hemoglobin: 9.2 g/dL — ABNORMAL LOW (ref 12.0–15.0)
MCH: 31.7 pg (ref 26.0–34.0)
MCHC: 32.4 g/dL (ref 30.0–36.0)
MCV: 97.9 fL (ref 80.0–100.0)
Platelets: 133 10*3/uL — ABNORMAL LOW (ref 150–400)
RBC: 2.9 MIL/uL — ABNORMAL LOW (ref 3.87–5.11)
RDW: 13.6 % (ref 11.5–15.5)
WBC: 10 10*3/uL (ref 4.0–10.5)
nRBC: 0 % (ref 0.0–0.2)

## 2023-04-17 MED ORDER — SENNA 8.6 MG PO TABS: 2.0000 | ORAL_TABLET | Freq: Every day | ORAL | 0 refills | Status: AC

## 2023-04-17 MED ORDER — CYCLOBENZAPRINE HCL 10 MG PO TABS: 10.0000 mg | ORAL_TABLET | Freq: Three times a day (TID) | ORAL | 2 refills | Status: DC | PRN

## 2023-04-17 MED ORDER — OXYCODONE HCL 5 MG PO TABS
5.0000 mg | ORAL_TABLET | ORAL | 0 refills | Status: DC | PRN
Start: 2023-04-17 — End: 2023-05-30

## 2023-04-17 MED ORDER — POLYETHYLENE GLYCOL 3350 17 G PO PACK: 17.0000 g | PACK | Freq: Two times a day (BID) | ORAL | 0 refills | Status: DC

## 2023-04-17 MED ORDER — RIVAROXABAN 10 MG PO TABS: 10.0000 mg | ORAL_TABLET | Freq: Every day | ORAL | 0 refills | Status: DC

## 2023-04-17 NOTE — TOC Transition Note (Signed)
 Transition of Care [facility]) - Discharge Note   Patient Details  Name: Linda Woods MRN: [id number] Date of Birth: [birth year]  [facility]) CM/SW Contact:  Amada Jupiter, LCSW Phone Number: 04/17/2023, 10:29 AM   Clinical Narrative:     Met with pt and confirming she has needed DME in the home.  OPPT already arranged with [facility] ([place]).  No further TOC needs.  Final next level of care: OP Rehab Barriers to Discharge: No Barriers Identified   Patient Goals and CMS Choice Patient states their goals for this hospitalization and ongoing recovery are:: return home          Discharge Placement                       Discharge Plan and Services Additional resources added to the After Visit Summary for                  DME Arranged: N/A DME Agency: NA                  Social Drivers of Health (SDOH) Interventions SDOH Screenings   Food Insecurity: No Food Insecurity (04/16/2023)  Housing: Low Risk  (04/16/2023)  Transportation Needs: No Transportation Needs (04/16/2023)  Utilities: Not At Risk (04/16/2023)  Depression (PHQ2-9): Low Risk  (11/01/2022)  Social Connections: Socially Integrated (04/17/2023)  Tobacco Use: Low Risk  (04/16/2023)     Readmission Risk Interventions     No data to display

## 2023-04-17 NOTE — Progress Notes (Signed)
 Patient ID: Linda Woods, female   DOB: [birth year], 73 y.o.   MRN: [id number] Subjective: 1 Day Post-Op Procedure(s) (LRB): TOTAL KNEE ARTHROPLASTY (Right)    Patient reports pain as mild to moderate. No events over night  Objective:   VITALS:   Vitals:   04/17/23 0038 04/17/23 0505  BP: 97/77 101/64  Pulse: 99 97  Resp: 17 17  Temp: 98.8 F (37.1 C) 97.8 F (36.6 C)  SpO2: 95% 99%    Neurovascular intact Incision: dressing C/D/I  LABS Recent Labs    04/17/23 0319  HGB 9.2*  HCT 28.4*  WBC 10.0  PLT 133*    Recent Labs    04/17/23 0319  NA 136  K 4.0  BUN 13  CREATININE 0.87  GLUCOSE 203*    No results for input(s): "LABPT", "INR" in the last 72 hours.   Assessment/Plan: 1 Day Post-Op Procedure(s) (LRB): TOTAL KNEE ARTHROPLASTY (Right)   Advance diet Up with therapy Home today after therapy RTC in 2 weeks

## 2023-04-17 NOTE — Progress Notes (Signed)
 Physical Therapy Treatment Patient Details Name: Linda Woods MRN: [id number] DOB: 04-28-1950 Today's Date: 04/17/2023   History of Present Illness 73 yo female presents to therapy s/p R TKA on 04/16/2023 due to failure of conservative measures. Pt PMH includes but is not limited to: Lung ca, asthma, diverticulosis, L TKA (2020), GERD, HLD, HTN, hypothyroid, and  RSL.    PT Comments  Pt is progressing well with mobility, she ambulated 150' with RW, no loss of balance. Stair training completed. She demonstrates good understanding of HEP and is ready to DC home from a PT standpoint. PT goals met.     If plan is discharge home, recommend the following: A little help with bathing/dressing/bathroom;Assistance with cooking/housework;Assist for transportation;Help with stairs or ramp for entrance   Can travel by private vehicle        Equipment Recommendations  None recommended by PT    Recommendations for Other Services       Precautions / Restrictions Precautions Precautions: Fall;Knee Precaution Booklet Issued: Yes (comment) Recall of Precautions/Restrictions: Intact Precaution/Restrictions Comments: reviewed no pillow under knee Restrictions Weight Bearing Restrictions Per Provider Order: No     Mobility  Bed Mobility Overal bed mobility: Modified Independent Bed Mobility: Supine to Sit     Supine to sit: Modified independent (Device/Increase time), HOB elevated, Used rails          Transfers Overall transfer level: Needs assistance Equipment used: Rolling walker (2 wheels) Transfers: Sit to/from Stand Sit to Stand: Supervision           General transfer comment: VCs for hand placement    Ambulation/Gait Ambulation/Gait assistance: Supervision, Modified independent (Device/Increase time) Gait Distance (Feet): 150 Feet Assistive device: Rolling walker (2 wheels) Gait Pattern/deviations: Step-to pattern, Decreased step length - right, Decreased step  length - left Gait velocity: decreased     General Gait Details: steady, no loss of balance   Stairs Stairs: Yes Stairs assistance: Min assist Stair Management: Backwards, No rails, With walker Number of Stairs: 1 General stair comments: VCs sequencing, min A to steady RW   Wheelchair Mobility     Tilt Bed    Modified Rankin (Stroke Patients Only)       Balance Overall balance assessment: Needs assistance Sitting-balance support: Feet supported Sitting balance-Leahy Scale: Good     Standing balance support: Bilateral upper extremity supported, During functional activity, Reliant on assistive device for balance Standing balance-Leahy Scale: Fair                              Hotel manager: No apparent difficulties  Cognition Arousal: Alert Behavior During Therapy: WFL for tasks assessed/performed   PT - Cognitive impairments: No apparent impairments                         Following commands: Intact      Cueing    Exercises Total Joint Exercises Ankle Circles/Pumps: AROM, Both, 10 reps Quad Sets: AROM, Both, 5 reps, Supine Short Arc Quad: AROM, Right, 5 reps, Supine Heel Slides: AAROM, Right, 5 reps, Supine Hip ABduction/ADduction: AROM, Right, 5 reps, Supine Straight Leg Raises: AROM, Right, 5 reps, Supine Long Arc Quad: AROM, Right, 5 reps, Seated Knee Flexion: AAROM, Right, 5 reps, Seated Goniometric ROM: 5-75* AAROM R knee    General Comments        Pertinent Vitals/Pain Pain Assessment Pain Score: 3  Pain Location: R  knee Pain Descriptors / Indicators: Sore Pain Intervention(s): Limited activity within patient's tolerance, Monitored during session, Premedicated before session, Ice applied    Home Living Family/patient expects to be discharged to:: Private residence Living Arrangements: Spouse/significant other                      Prior Function            PT Goals (current  goals can now be found in the care plan section) Acute Rehab PT Goals Patient Stated Goal: to be able to get back to my life and go home; travel to [place] this summer PT Goal Formulation: With patient Time For Goal Achievement: 04/30/23 Potential to Achieve Goals: Good Progress towards PT goals: Goals met/education completed, patient discharged from PT    Frequency    7X/week      PT Plan      Co-evaluation              AM-PAC PT "6 Clicks" Mobility   Outcome Measure  Help needed turning from your back to your side while in a flat bed without using bedrails?: None Help needed moving from lying on your back to sitting on the side of a flat bed without using bedrails?: None Help needed moving to and from a bed to a chair (including a wheelchair)?: None Help needed standing up from a chair using your arms (e.g., wheelchair or bedside chair)?: None Help needed to walk in hospital room?: None Help needed climbing 3-5 steps with a railing? : A Little 6 Click Score: 23    End of Session Equipment Utilized During Treatment: Gait belt Activity Tolerance: Patient limited by pain Patient left: in chair;with call bell/phone within reach;with chair alarm set Nurse Communication: Mobility status PT Visit Diagnosis: Other abnormalities of gait and mobility (R26.89);Muscle weakness (generalized) (M62.81);Pain;Difficulty in walking, not elsewhere classified (R26.2) Pain - Right/Left: Right Pain - part of body: Knee     Time: 1610-9604 PT Time Calculation (min) (ACUTE ONLY): 22 min  Charges:    $Gait Training: 8-22 mins PT General Charges $$ ACUTE PT VISIT: 1 Visit                     Tamala Ser PT 04/17/2023  Acute Rehabilitation Services  Office [phone]

## 2023-04-17 NOTE — Care Management Obs Status (Signed)
 MEDICARE OBSERVATION STATUS NOTIFICATION   Patient Details  Name: Linda Woods MRN: [id number] Date of Birth: [birth year]   Medicare Observation Status Notification Given:  Yes    Amada Jupiter, LCSW 04/17/2023, 10:28 AM

## 2023-04-18 ENCOUNTER — Other Ambulatory Visit: Payer: Self-pay

## 2023-04-19 ENCOUNTER — Other Ambulatory Visit: Payer: Self-pay | Admitting: Internal Medicine

## 2023-04-19 ENCOUNTER — Other Ambulatory Visit: Payer: Self-pay

## 2023-04-19 ENCOUNTER — Other Ambulatory Visit: Payer: Self-pay | Admitting: Pharmacy Technician

## 2023-04-19 DIAGNOSIS — C3492 Malignant neoplasm of unspecified part of left bronchus or lung: Secondary | ICD-10-CM

## 2023-04-19 MED ORDER — OSIMERTINIB MESYLATE 80 MG PO TABS
ORAL_TABLET | Freq: Every day | ORAL | 2 refills | Status: DC
  Filled 2023-04-22: qty 30, 30d supply, fill #0
  Filled 2023-05-14: qty 30, 30d supply, fill #1
  Filled 2023-06-12 (×2): qty 30, 30d supply, fill #2

## 2023-04-19 NOTE — Progress Notes (Signed)
 Specialty Pharmacy Refill Coordination Note  Linda Woods is a 73 y.o. female contacted today regarding refills of specialty medication(s) Osimertinib Mesylate [name]  Spoke with Husband  Patient requested Delivery   Delivery date: 04/23/23   Verified address: [address]  [place]   Medication will be filled on 04/22/23.   RR sent to MD

## 2023-04-22 ENCOUNTER — Other Ambulatory Visit: Payer: Self-pay

## 2023-04-22 DIAGNOSIS — M25561 Pain in right knee: Secondary | ICD-10-CM | POA: Diagnosis not present

## 2023-04-22 DIAGNOSIS — M25661 Stiffness of right knee, not elsewhere classified: Secondary | ICD-10-CM | POA: Diagnosis not present

## 2023-04-23 NOTE — Discharge Summary (Signed)
 Patient ID: Linda Woods MRN: [id number] DOB/AGE: 73/22/1952 73 y.o.  Admit date: 04/16/2023 Discharge date: 04/17/2023  Admission Diagnoses:  Right knee osteoarthritis  Discharge Diagnoses:  Principal Problem:   S/P total knee arthroplasty, right   Past Medical History:  Diagnosis Date   Anemia    as a child   Anxiety    Asthma 10/19/2020   Bursitis    Chronic reflux esophagitis    Complication of anesthesia    Diarrhea, functional    Diverticulosis    GERD (gastroesophageal reflux disease)    History of kidney stones    Hypertension    Knee pain    right knee-seeing ortho   lung ca 09/2019   Osteoarthritis    Plantar fasciitis    Pneumonia    PONV (postoperative nausea and vomiting)    has used the patch before and that helps   Pure hypercholesterolemia    RLS (restless legs syndrome)    Superficial vein thrombosis    Thyroid disease    hypothyroid    Surgeries: Procedure(s): TOTAL KNEE ARTHROPLASTY on 04/16/2023   Consultants:   Discharged Condition: Improved  Hospital Course: Linda Woods is an 73 y.o. female who was admitted 04/16/2023 for operative treatment ofS/P total knee arthroplasty, right. Patient has severe unremitting pain that affects sleep, daily activities, and work/hobbies. After pre-op clearance the patient was taken to the operating room on 04/16/2023 and underwent  Procedure(s): TOTAL KNEE ARTHROPLASTY.    Patient was given perioperative antibiotics:  Anti-infectives (From admission, onward)    Start     Dose/Rate Route Frequency Ordered Stop   04/16/23 1730  ceFAZolin (ANCEF) IVPB 2g/100 mL premix        2 g 200 mL/hr over 30 Minutes Intravenous Every 6 hours 04/16/23 1624 04/17/23 0848   04/16/23 0800  ceFAZolin (ANCEF) IVPB 2g/100 mL premix        2 g 200 mL/hr over 30 Minutes Intravenous On call to O.R. 04/16/23 0745 04/16/23 1006   04/16/23 0800  vancomycin (VANCOCIN) IVPB 1000 mg/200 mL premix        1,000 mg 200  mL/hr over 60 Minutes Intravenous  Once 04/16/23 0745 04/16/23 0916        Patient was given sequential compression devices, early ambulation, and chemoprophylaxis to prevent DVT. Patient worked with PT and was meeting their goals regarding safe ambulation and transfers.  Patient benefited maximally from hospital stay and there were no complications.    Recent vital signs: No data found.   Recent laboratory studies: No results for input(s): "WBC", "HGB", "HCT", "PLT", "NA", "K", "CL", "CO2", "BUN", "CREATININE", "GLUCOSE", "INR", "CALCIUM" in the last 72 hours.  Invalid input(s): "PT", "2"   Discharge Medications:   Allergies as of 04/17/2023   No Known Allergies      Medication List     STOP taking these medications    HYDROcodone-acetaminophen 5-325 MG tablet Commonly known as: NORCO/VICODIN       TAKE these medications    albuterol 108 (90 Base) MCG/ACT inhaler Commonly known as: VENTOLIN HFA Inhale 2 puffs into the lungs every 6 (six) hours as needed for wheezing or shortness of breath.   chlorhexidine 4 % external liquid Commonly known as: HIBICLENS Apply 15 mLs (1 Application total) topically as directed for 30 doses. Use as directed daily for 5 days every other week for 6 weeks.   cholecalciferol 25 MCG (1000 UNIT) tablet Commonly known as: VITAMIN D3 Take 1,000 Units by  mouth daily. Pt states she take 2000 units/day   cyanocobalamin 1000 MCG tablet Commonly known as: VITAMIN B12 Take 1,000 mcg by mouth daily.   cyclobenzaprine 10 MG tablet Commonly known as: FLEXERIL Take 1 tablet (10 mg total) by mouth 3 (three) times daily as needed for muscle spasms. What changed: when to take this   famotidine 40 MG tablet Commonly known as: PEPCID Take 40 mg by mouth at bedtime.   ferrous sulfate 325 (65 FE) MG tablet Take 325 mg by mouth daily with breakfast.   fexofenadine 180 MG tablet Commonly known as: ALLEGRA Take 180 mg by mouth daily as needed for  allergies.   fluticasone 50 MCG/ACT nasal spray Commonly known as: FLONASE Place 1 spray into both nostrils as needed for allergies.   levothyroxine 100 MCG tablet Commonly known as: Synthroid Take 1 tablet (100 mcg total) by mouth daily. One po qd   lidocaine 5 % Commonly known as: LIDODERM Place 1 patch onto the skin daily.   loperamide 2 MG capsule Commonly known as: IMODIUM Take 2 mg by mouth every 6 (six) hours as needed for diarrhea or loose stools.   LORazepam 0.5 MG tablet Commonly known as: ATIVAN Take 0.5 mg by mouth at bedtime.   MAGNESIUM PO Take 1 tablet by mouth daily.   melatonin 5 MG Tabs Take 5 mg by mouth at bedtime as needed (sleep).   metoprolol succinate 50 MG 24 hr tablet Commonly known as: TOPROL-XL Take 1 tablet (50 mg total) by mouth daily.   metoprolol tartrate 100 MG tablet Commonly known as: Lopressor Take 1 tablet (100 mg total) by mouth as directed. Take 2 hours prior to CT Scan ( Hold Toprol the day of CT Scan )   mupirocin ointment 2 % Commonly known as: BACTROBAN Place 1 Application into the nose 2 (two) times daily for 60 doses. Use as directed 2 times daily for 5 days every other week for 6 weeks.   Myrbetriq 50 MG Tb24 tablet Generic drug: mirabegron ER Take 50 mg by mouth daily.   oxyCODONE 5 MG immediate release tablet Commonly known as: Oxy IR/ROXICODONE Take 1-2 tablets (5-10 mg total) by mouth every 4 (four) hours as needed for severe pain (pain score 7-10).   oxymetazoline 0.05 % nasal spray Commonly known as: AFRIN Place 1 spray into both nostrils daily as needed for congestion.   polyethylene glycol 17 g packet Commonly known as: MIRALAX / GLYCOLAX Take 17 g by mouth 2 (two) times daily.   POTASSIUM PO Take 1 tablet by mouth daily.   Premarin vaginal cream Generic drug: conjugated estrogens Place 1 applicator vaginally as needed (urinary issues).   RABEprazole 20 MG tablet Commonly known as: Aciphex Take 1  tablet (20 mg total) by mouth 2 (two) times a week. What changed: when to take this   rivaroxaban 10 MG Tabs tablet Commonly known as: XARELTO Take 1 tablet (10 mg total) by mouth daily with breakfast for 21 days.   rosuvastatin 5 MG tablet Commonly known as: CRESTOR Take 1 tablet (5 mg total) by mouth daily.   senna 8.6 MG Tabs tablet Commonly known as: SENOKOT Take 2 tablets (17.2 mg total) by mouth at bedtime for 14 days.   Tiotropium Bromide-Olodaterol 2.5-2.5 MCG/ACT Aers Inhale 2 puffs into the lungs daily.   triamcinolone cream 0.1 % Commonly known as: KENALOG Apply 1 Application topically 2 (two) times daily. As needed for itching/rash What changed:  when to take this  reasons to take this additional instructions               Discharge Care Instructions  (From admission, onward)           Start     Ordered   04/17/23 0000  Change dressing       Comments: Maintain surgical dressing until follow up in the clinic. If the edges start to pull up, may reinforce with tape. If the dressing is no longer working, may remove and cover with gauze and tape, but must keep the area dry and clean.  Call with any questions or concerns.   04/17/23 4098            Diagnostic Studies: CT CORONARY MORPH W/CTA COR W/SCORE W/CA W/CM &/OR WO/CM Addendum Date: 04/19/2023 ADDENDUM REPORT: 04/19/2023 17:10 EXAM: OVER-READ INTERPRETATION  CT CHEST The following report is an over-read performed by radiologist Dr. Narda Rutherford of [facility] on 04/19/2023. This over-read does not include interpretation of cardiac or coronary anatomy or pathology. The coronary CTA interpretation by the cardiologist is attached. COMPARISON:  PET CT 03/01/2023 FINDINGS: Vascular: The included aorta is normal in caliber. Mediastinum/nodes: Small to moderate-sized hiatal hernia. Small amount of fluid adjacent to the hiatal hernia is unchanged from prior exam. Lungs: Partially included in the  field of view is perihilar volume loss, better characterized on recent PET. No acute airspace disease or pleural effusion. Upper abdomen: No acute or unexpected findings. Musculoskeletal: No acute osseous findings. Stable sclerotic focus in the lower thoracic spine. IMPRESSION: 1. Small to moderate-sized hiatal hernia. Small amount of fluid adjacent to the hiatal hernia is unchanged from prior exam. 2. Partially included in the field of view is perihilar volume loss, better characterized on recent PET. Electronically Signed   By: Narda Rutherford M.D.   On: 04/19/2023 17:10   Result Date: 04/19/2023 CLINICAL DATA:  71F with chest pain EXAM: Cardiac/Coronary CTA TECHNIQUE: The patient was scanned on a [employer]. FINDINGS: A 100 kV prospective scan was triggered in the descending thoracic aorta at 111 HU's. Axial non-contrast 3 mm slices were carried out through the heart. The data set was analyzed on a dedicated work station and scored using the Agatson method. Gantry rotation speed was 250 msecs and collimation was .6 mm. No beta blockade and 0.8 mg of sl NTG was given. The 3D data set was reconstructed in 5% intervals of the 35-75% of the R-R cycle. Phases were analyzed on a dedicated work station using MPR, MIP and VRT modes. The patient received 100 cc of contrast. Coronary Arteries:  Normal coronary origin.  Right dominance. RCA is a large dominant artery that gives rise to PDA and PLA. Calcified plaque in proximal RCA causes 0-24% stenosis Left main is a large artery that gives rise to LAD and LCX arteries. LAD is a large vessel. Noncalcified plaque in proximal LAD causes 0-24% stenosis. Mixed plaque in mid LAD causes 0-24% stenosis. Calcified plaque in distal LAD causes 25-49% stenosis LCX is a non-dominant artery that gives rise to one large OM1 branch. Calcified plaque in proximal LCX causes 0-24% stenosis Other findings: Left Ventricle: Normal size Left Atrium: Normal size Pulmonary Veins:  Normal configuration Right Ventricle: Mild dilatation Right Atrium: Mild enlargement Cardiac valves: No calcifications Thoracic aorta: Normal size Pulmonary Arteries: Normal size Systemic Veins: Normal drainage Pericardium: Normal thickness IMPRESSION: 1. Coronary calcium score of 45. This was 56th percentile for age and sex matched control. 2. Total  plaque volume 30mm3 which is 21st percentile for age and sex-matched controls (calcified plaque 41mm3; noncalcified plaque 79mm3). TPV is mild 3.  Normal coronary origin with right dominance. 4.  Nonobstructive CAD 5.  Mild (25-49%) stenosis in distal LAD 6. Minimal (0-24%) stenosis in proximal/mid LAD, proximal LCX, and proximal RCA CAD-RADS 2. Mild non-obstructive CAD (25-49%). Consider non-atherosclerotic causes of chest pain. Consider preventive therapy and risk factor modification. Electronically Signed: By: Epifanio Lesches M.D. On: 04/03/2023 16:30    Disposition: Discharge disposition: 01-Home or Self Care       Discharge Instructions     Call MD / Call 911   Complete by: As directed    If you experience chest pain or shortness of breath, CALL 911 and be transported to the hospital emergency room.  If you develope a fever above 101 F, pus (white drainage) or increased drainage or redness at the wound, or calf pain, call your surgeon's office.   Change dressing   Complete by: As directed    Maintain surgical dressing until follow up in the clinic. If the edges start to pull up, may reinforce with tape. If the dressing is no longer working, may remove and cover with gauze and tape, but must keep the area dry and clean.  Call with any questions or concerns.   Constipation Prevention   Complete by: As directed    Drink plenty of fluids.  Prune juice may be helpful.  You may use a stool softener, such as Colace (over the counter) 100 mg twice a day.  Use MiraLax (over the counter) for constipation as needed.   Diet - low sodium heart healthy    Complete by: As directed    Increase activity slowly as tolerated   Complete by: As directed    Weight bearing as tolerated with assist device (walker, cane, etc) as directed, use it as long as suggested by your surgeon or therapist, typically at least 4-6 weeks.   Post-operative opioid taper instructions:   Complete by: As directed    POST-OPERATIVE OPIOID TAPER INSTRUCTIONS: It is important to wean off of your opioid medication as soon as possible. If you do not need pain medication after your surgery it is ok to stop day one. Opioids include: Codeine, Hydrocodone(Norco, Vicodin), Oxycodone(Percocet, oxycontin) and hydromorphone amongst others.  Long term and even short term use of opiods can cause: Increased pain response Dependence Constipation Depression Respiratory depression And more.  Withdrawal symptoms can include Flu like symptoms Nausea, vomiting And more Techniques to manage these symptoms Hydrate well Eat regular healthy meals Stay active Use relaxation techniques(deep breathing, meditating, yoga) Do Not substitute Alcohol to help with tapering If you have been on opioids for less than two weeks and do not have pain than it is ok to stop all together.  Plan to wean off of opioids This plan should start within one week post op of your joint replacement. Maintain the same interval or time between taking each dose and first decrease the dose.  Cut the total daily intake of opioids by one tablet each day Next start to increase the time between doses. The last dose that should be eliminated is the evening dose.      TED hose   Complete by: As directed    Use stockings (TED hose) for 2 weeks on both leg(s).  You may remove them at night for sleeping.        Follow-up Information     Charlann Boxer,  Molli Hazard, MD. Schedule an appointment as soon as possible for a visit in 2 week(s).   Specialty: Orthopedic Surgery Contact information: [address] [place]  [address] [place] [address] [phone]                  Signed: Cassandria Anger 04/23/2023, 4:13 PM

## 2023-04-24 DIAGNOSIS — M25661 Stiffness of right knee, not elsewhere classified: Secondary | ICD-10-CM | POA: Diagnosis not present

## 2023-04-24 DIAGNOSIS — M25561 Pain in right knee: Secondary | ICD-10-CM | POA: Diagnosis not present

## 2023-04-26 DIAGNOSIS — M25561 Pain in right knee: Secondary | ICD-10-CM | POA: Diagnosis not present

## 2023-04-26 DIAGNOSIS — M25661 Stiffness of right knee, not elsewhere classified: Secondary | ICD-10-CM | POA: Diagnosis not present

## 2023-04-29 DIAGNOSIS — M25561 Pain in right knee: Secondary | ICD-10-CM | POA: Diagnosis not present

## 2023-04-29 DIAGNOSIS — M25661 Stiffness of right knee, not elsewhere classified: Secondary | ICD-10-CM | POA: Diagnosis not present

## 2023-05-01 DIAGNOSIS — M25561 Pain in right knee: Secondary | ICD-10-CM | POA: Diagnosis not present

## 2023-05-01 DIAGNOSIS — M25661 Stiffness of right knee, not elsewhere classified: Secondary | ICD-10-CM | POA: Diagnosis not present

## 2023-05-06 DIAGNOSIS — M25661 Stiffness of right knee, not elsewhere classified: Secondary | ICD-10-CM | POA: Diagnosis not present

## 2023-05-06 DIAGNOSIS — M25561 Pain in right knee: Secondary | ICD-10-CM | POA: Diagnosis not present

## 2023-05-08 ENCOUNTER — Inpatient Hospital Stay: Payer: Medicare Other | Admitting: Internal Medicine

## 2023-05-08 ENCOUNTER — Inpatient Hospital Stay: Payer: Medicare Other | Attending: Internal Medicine

## 2023-05-08 VITALS — BP 106/70 | HR 95 | Temp 97.2°F | Resp 17 | Ht 64.0 in | Wt 155.0 lb

## 2023-05-08 DIAGNOSIS — C3490 Malignant neoplasm of unspecified part of unspecified bronchus or lung: Secondary | ICD-10-CM

## 2023-05-08 DIAGNOSIS — C3412 Malignant neoplasm of upper lobe, left bronchus or lung: Secondary | ICD-10-CM | POA: Insufficient documentation

## 2023-05-08 DIAGNOSIS — M25561 Pain in right knee: Secondary | ICD-10-CM | POA: Diagnosis not present

## 2023-05-08 DIAGNOSIS — D649 Anemia, unspecified: Secondary | ICD-10-CM | POA: Insufficient documentation

## 2023-05-08 DIAGNOSIS — M25661 Stiffness of right knee, not elsewhere classified: Secondary | ICD-10-CM | POA: Diagnosis not present

## 2023-05-08 DIAGNOSIS — R7989 Other specified abnormal findings of blood chemistry: Secondary | ICD-10-CM | POA: Insufficient documentation

## 2023-05-08 DIAGNOSIS — C3492 Malignant neoplasm of unspecified part of left bronchus or lung: Secondary | ICD-10-CM

## 2023-05-08 LAB — CMP (CANCER CENTER ONLY)
ALT: 8 U/L (ref 0–44)
AST: 14 U/L — ABNORMAL LOW (ref 15–41)
Albumin: 4.1 g/dL (ref 3.5–5.0)
Alkaline Phosphatase: 104 U/L (ref 38–126)
Anion gap: 7 (ref 5–15)
BUN: 10 mg/dL (ref 8–23)
CO2: 28 mmol/L (ref 22–32)
Calcium: 9.7 mg/dL (ref 8.9–10.3)
Chloride: 104 mmol/L (ref 98–111)
Creatinine: 1.42 mg/dL — ABNORMAL HIGH (ref 0.44–1.00)
GFR, Estimated: 39 mL/min — ABNORMAL LOW (ref >60)
Glucose, Bld: 126 mg/dL — ABNORMAL HIGH (ref 70–99)
Potassium: 4.4 mmol/L (ref 3.5–5.1)
Sodium: 139 mmol/L (ref 135–145)
Total Bilirubin: 0.4 mg/dL (ref 0.0–1.2)
Total Protein: 6.8 g/dL (ref 6.5–8.1)

## 2023-05-08 LAB — CBC WITH DIFFERENTIAL (CANCER CENTER ONLY)
Abs Immature Granulocytes: 0.01 10*3/uL (ref 0.00–0.07)
Basophils Absolute: 0 10*3/uL (ref 0.0–0.1)
Basophils Relative: 0 %
Eosinophils Absolute: 0.2 10*3/uL (ref 0.0–0.5)
Eosinophils Relative: 4 %
HCT: 28.6 % — ABNORMAL LOW (ref 36.0–46.0)
Hemoglobin: 9.1 g/dL — ABNORMAL LOW (ref 12.0–15.0)
Immature Granulocytes: 0 %
Lymphocytes Relative: 19 %
Lymphs Abs: 0.9 10*3/uL (ref 0.7–4.0)
MCH: 30.2 pg (ref 26.0–34.0)
MCHC: 31.8 g/dL (ref 30.0–36.0)
MCV: 95 fL (ref 80.0–100.0)
Monocytes Absolute: 0.5 10*3/uL (ref 0.1–1.0)
Monocytes Relative: 11 %
Neutro Abs: 3.1 10*3/uL (ref 1.7–7.7)
Neutrophils Relative %: 66 %
Platelet Count: 194 10*3/uL (ref 150–400)
RBC: 3.01 MIL/uL — ABNORMAL LOW (ref 3.87–5.11)
RDW: 13.6 % (ref 11.5–15.5)
WBC Count: 4.8 10*3/uL (ref 4.0–10.5)
nRBC: 0 % (ref 0.0–0.2)

## 2023-05-08 NOTE — Progress Notes (Signed)
 [facility] [phone]   [phone]  OFFICE PROGRESS NOTE  Assunta Found, MD [address] [place] [address]  DIAGNOSIS: Stage IIIc (T3, N3, M0) non-small cell lung cancer, adenocarcinoma presented with large left upper lobe lung mass in addition to left infrahilar and right hilar and subcarinal lymphadenopathy diagnosed in August 2021.   Molecular Biomarkers: Insufficient tissue for foundation 1. Guardant 360 results.  DETECTED ALTERATION(S) / BIOMARKER(S) % CFDNA OR AMPLIFICATION ASSOCIATED FDA-APPROVED THERAPIES CLINICAL TRIAL AVAILABILITY  EGFR L858R, 1.2%, Afatinib, Dacomitinib, Erlotinib, Gefitinib, Osimertinib, Ramucirumab Yes [id number], 1.0%, Afatinib, Dacomitinib, Erlotinib, Gefitinib, Osimertinib, Ramucirumab Yes [id number] 1.0% None  No   PRIOR THERAPY: Weekly concurrent chemoradiation with carboplatin for an AUC of 2 and paclitaxel 45 mg/m2.  First dose expected on 11/16/2019. Status post 7 cycles.   CURRENT THERAPY: Tagrisso 80 mg p.o. daily. First dose started 01/31/2020.  Status post 34 months of treatment.  INTERVAL HISTORY: Linda Woods 73 y.o. female returns to clinic today for follow-up visit accompanied by her husband.Discussed the use of AI scribe software for clinical note transcription with the patient, who gave verbal consent to proceed.  History of Present Illness   Linda Woods is a 73 year old female with stage III C non-small cell lung cancer adenocarcinoma who presents with concerns about neck nodules and anemia. She is accompanied by her husband.  She was diagnosed with stage III C non-small cell lung cancer adenocarcinoma in August 2021, characterized by an EGFR mutation, L858R. She has been on Tagrisso 80 mg once daily for 39 months as consolidation treatment following chemotherapy and radiation. She feels 'terrible' and has noticed sore nodules on the left neck area for about two and a  half weeks. A PET scan two months ago did not show any abnormalities in that area.  She recently underwent right knee replacement surgery and has been experiencing anemia. Her hemoglobin has decreased from 12.2 g/dL in January to 9.1 g/dL currently, causing fatigue. She attributes some blood loss to the surgery and has restarted taking iron supplements after discontinuing them prior to surgery. No dental issues, bleeding, or hemorrhoids are reported.  Her kidney function has shown changes, with serum creatinine rising to 1.42 mg/dL from normal levels in January. She denies taking NSAIDs and is currently on oxycodone for pain management. She has also resumed taking her vitamins.      MEDICAL HISTORY: Past Medical History:  Diagnosis Date   Anemia    as a child   Anxiety    Asthma 10/19/2020   Bursitis    Chronic reflux esophagitis    Complication of anesthesia    Diarrhea, functional    Diverticulosis    GERD (gastroesophageal reflux disease)    History of kidney stones    Hypertension    Knee pain    right knee-seeing ortho   lung ca 09/2019   Osteoarthritis    Plantar fasciitis    Pneumonia    PONV (postoperative nausea and vomiting)    has used the patch before and that helps   Pure hypercholesterolemia    RLS (restless legs syndrome)    Superficial vein thrombosis    Thyroid disease    hypothyroid    ALLERGIES:  has no known allergies.  MEDICATIONS:  Current Outpatient Medications  Medication Sig Dispense Refill   Tiotropium Bromide-Olodaterol 2.5-2.5 MCG/ACT AERS Inhale 2 puffs into the lungs daily. 4 g 5   albuterol (VENTOLIN HFA) 108 (  90 Base) MCG/ACT inhaler Inhale 2 puffs into the lungs every 6 (six) hours as needed for wheezing or shortness of breath. 8.5 g 0   chlorhexidine (HIBICLENS) 4 % external liquid Apply 15 mLs (1 Application total) topically as directed for 30 doses. Use as directed daily for 5 days every other week for 6 weeks. 946 mL 1    cholecalciferol (VITAMIN D3) 25 MCG (1000 UNIT) tablet Take 1,000 Units by mouth daily. Pt states she take 2000 units/day     cyclobenzaprine (FLEXERIL) 10 MG tablet Take 1 tablet (10 mg total) by mouth 3 (three) times daily as needed for muscle spasms. 30 tablet 2   famotidine (PEPCID) 40 MG tablet Take 40 mg by mouth at bedtime.     ferrous sulfate 325 (65 FE) MG tablet Take 325 mg by mouth daily with breakfast.     fexofenadine (ALLEGRA) 180 MG tablet Take 180 mg by mouth daily as needed for allergies.     fluticasone (FLONASE) 50 MCG/ACT nasal spray Place 1 spray into both nostrils as needed for allergies.     levothyroxine (SYNTHROID) 100 MCG tablet Take 1 tablet (100 mcg total) by mouth daily. One po qd 90 tablet 0   lidocaine (LIDODERM) 5 % Place 1 patch onto the skin daily.     loperamide (IMODIUM) 2 MG capsule Take 2 mg by mouth every 6 (six) hours as needed for diarrhea or loose stools.     LORazepam (ATIVAN) 0.5 MG tablet Take 0.5 mg by mouth at bedtime.      MAGNESIUM PO Take 1 tablet by mouth daily.     melatonin 5 MG TABS Take 5 mg by mouth at bedtime as needed (sleep).     metoprolol succinate (TOPROL-XL) 50 MG 24 hr tablet Take 1 tablet (50 mg total) by mouth daily. 90 tablet 3   metoprolol tartrate (LOPRESSOR) 100 MG tablet Take 1 tablet (100 mg total) by mouth as directed. Take 2 hours prior to CT Scan ( Hold Toprol the day of CT Scan ) 1 tablet 0   mupirocin ointment (BACTROBAN) 2 % Place 1 Application into the nose 2 (two) times daily for 60 doses. Use as directed 2 times daily for 5 days every other week for 6 weeks. 60 g 0   MYRBETRIQ 50 MG TB24 tablet Take 50 mg by mouth daily.     osimertinib mesylate (TAGRISSO) 80 MG tablet TAKE 1 TABLET BY MOUTH DAILY. 30 tablet 2   oxyCODONE (OXY IR/ROXICODONE) 5 MG immediate release tablet Take 1-2 tablets (5-10 mg total) by mouth every 4 (four) hours as needed for severe pain (pain score 7-10). 42 tablet 0   oxymetazoline (AFRIN) 0.05 %  nasal spray Place 1 spray into both nostrils daily as needed for congestion.     polyethylene glycol (MIRALAX / GLYCOLAX) 17 g packet Take 17 g by mouth 2 (two) times daily. 14 each 0   POTASSIUM PO Take 1 tablet by mouth daily.     PREMARIN vaginal cream Place 1 applicator vaginally as needed (urinary issues).     RABEprazole (ACIPHEX) 20 MG tablet Take 1 tablet (20 mg total) by mouth 2 (two) times a week. (Patient taking differently: Take 20 mg by mouth daily.)     rivaroxaban (XARELTO) 10 MG TABS tablet Take 1 tablet (10 mg total) by mouth daily with breakfast for 21 days. 21 tablet 0   rosuvastatin (CRESTOR) 5 MG tablet Take 1 tablet (5 mg total) by mouth  daily. 90 tablet 3   triamcinolone cream (KENALOG) 0.1 % Apply 1 Application topically 2 (two) times daily. As needed for itching/rash (Patient taking differently: Apply 1 Application topically as needed (rash/itching).) 453.6 g 0   vitamin B-12 (CYANOCOBALAMIN) 1000 MCG tablet Take 1,000 mcg by mouth daily.     No current facility-administered medications for this visit.    SURGICAL HISTORY:  Past Surgical History:  Procedure Laterality Date   APPENDECTOMY     BRONCHIAL BIOPSY  10/20/2019   Procedure: BRONCHIAL BIOPSIES;  Surgeon: Leslye Peer, MD;  Location: [facility] ENDOSCOPY;  Service: Pulmonary;;   BRONCHIAL BRUSHINGS  10/20/2019   Procedure: BRONCHIAL BRUSHINGS;  Surgeon: Leslye Peer, MD;  Location: [facility] ENDOSCOPY;  Service: Pulmonary;;   BRONCHIAL NEEDLE ASPIRATION BIOPSY  10/20/2019   Procedure: BRONCHIAL NEEDLE ASPIRATION BIOPSIES;  Surgeon: Leslye Peer, MD;  Location: [facility] ENDOSCOPY;  Service: Pulmonary;;   BRONCHIAL WASHINGS  10/20/2019   Procedure: BRONCHIAL WASHINGS;  Surgeon: Leslye Peer, MD;  Location: [facility] ENDOSCOPY;  Service: Pulmonary;;   CARPAL TUNNEL RELEASE Right 2011   Dr. Amanda Pea   COLPORRHAPHY     posterior   HAND SURGERY Right 2016   Nerve surgery, Dr. Amanda Pea   OVARIAN CYST REMOVAL     RECTOCELE REPAIR   2011   w/TVH and sling   TONSILLECTOMY     TONSILLECTOMY     TOTAL KNEE ARTHROPLASTY Left 09/09/2018   Procedure: TOTAL KNEE ARTHROPLASTY, CORTISONE INJECTION RIGHT KNEE;  Surgeon: Durene Romans, MD;  Location: [facility] ORS;  Service: Orthopedics;  Laterality: Left;  70 mins   TOTAL KNEE ARTHROPLASTY Right 04/16/2023   Procedure: TOTAL KNEE ARTHROPLASTY;  Surgeon: Durene Romans, MD;  Location: [facility] ORS;  Service: Orthopedics;  Laterality: Right;   TOTAL VAGINAL HYSTERECTOMY  10/18/2009   rectocele repair, sling   TUBAL LIGATION Bilateral    VARICOSE VEIN SURGERY     VIDEO BRONCHOSCOPY WITH ENDOBRONCHIAL NAVIGATION N/A 10/20/2019   Procedure: VIDEO BRONCHOSCOPY WITH ENDOBRONCHIAL NAVIGATION;  Surgeon: Leslye Peer, MD;  Location: [facility] ENDOSCOPY;  Service: Pulmonary;  Laterality: N/A;    REVIEW OF SYSTEMS:  Constitutional: positive for fatigue Eyes: negative Ears, nose, mouth, throat, and face: negative Respiratory: negative Cardiovascular: negative Gastrointestinal: negative Genitourinary:negative Integument/breast: negative Hematologic/lymphatic: negative Musculoskeletal:positive for arthralgias Neurological: negative Behavioral/Psych: negative Endocrine: negative Allergic/Immunologic: negative   PHYSICAL EXAMINATION: General appearance: alert, cooperative, fatigued, and no distress Head: Normocephalic, without obvious abnormality, atraumatic Neck: no adenopathy, no JVD, supple, symmetrical, trachea midline, and thyroid not enlarged, symmetric, no tenderness/mass/nodules Lymph nodes: Cervical, supraclavicular, and axillary nodes normal. Resp: clear to auscultation bilaterally Back: symmetric, no curvature. ROM normal. No CVA tenderness. Cardio: regular rate and rhythm, S1, S2 normal, no murmur, click, rub or gallop GI: soft, non-tender; bowel sounds normal; no masses,  no organomegaly Extremities: extremities normal, atraumatic, no cyanosis or edema Neurologic: Alert and oriented X 3,  normal strength and tone. Normal symmetric reflexes. Normal coordination and gait  ECOG PERFORMANCE STATUS: 1 - Symptomatic but completely ambulatory  Blood pressure 106/70, pulse 95, temperature (!) 97.2 F (36.2 C), temperature source Temporal, resp. rate 17, height 5\' 4"  (1.626 m), weight 155 lb (70.3 kg), last menstrual period 08/21/2002, SpO2 100%.  LABORATORY DATA: Lab Results  Component Value Date   WBC 4.8 05/08/2023   HGB 9.1 (L) 05/08/2023   HCT 28.6 (L) 05/08/2023   MCV 95.0 05/08/2023   PLT 194 05/08/2023      Chemistry  Component Value Date/Time   NA 139 05/08/2023 1017   K 4.4 05/08/2023 1017   CL 104 05/08/2023 1017   CO2 28 05/08/2023 1017   BUN 10 05/08/2023 1017   CREATININE 1.42 (H) 05/08/2023 1017      Component Value Date/Time   CALCIUM 9.7 05/08/2023 1017   ALKPHOS 104 05/08/2023 1017   AST 14 (L) 05/08/2023 1017   ALT 8 05/08/2023 1017   BILITOT 0.4 05/08/2023 1017       RADIOGRAPHIC STUDIES: No results found.   ASSESSMENT AND PLAN: This is a very pleasant 73 years old white female recently diagnosed with a stage IIIc/IV (T3, N3, M0/M1a) non-small cell lung cancer, adenocarcinoma presented with large left upper lobe lung mass in addition to left infrahilar, right hilar and subcarinal lymphadenopathy and small left pleural effusion diagnosed in August 2021 with positive EGFR mutation in exon 21 (L858R). The patient completed a course of concurrent chemoradiation with weekly carboplatin and paclitaxel status post 7 cycles.  She tolerated her treatment well except for fatigue as well as a skin burn and cough. Her scan showed mild improvement of the left upper lobe lung mass as well as the mediastinal lymphadenopathy.  The patient continues to have small left pleural effusion that is suspicious given her presentation. She is currently on treatment with Tagrisso 80 mg p.o. daily started on January 31, 2020.  She is status post 39 months of treatment.    The patient has been tolerating this treatment well with no concerning adverse effects.    Stage IIIc non-small cell lung cancer (NSCLC) adenocarcinoma with EGFR mutation Diagnosed in August 2021 with stage IIIc NSCLC adenocarcinoma with EGFR mutation L858R. Initially treated with chemotherapy and radiation, followed by consolidation treatment with Tagrisso (osimertinib) 80 mg daily for 39 months. Recent PET scan two months ago showed no evidence of metastasis. She reports feeling unwell and has noticed sore nodules on the left neck area for the past two and a half weeks. Differential diagnosis includes possible metastasis or benign etiology. Further investigation with imaging is warranted. - Continue Tagrisso 80 mg daily - Order CT of the neck to evaluate nodules, to be done next week - Schedule follow-up in two months with chest scan and labs one week prior to visit  Anemia Hemoglobin decreased from 12.2 g/dL in January to 9.1 g/dL currently, with hematocrit at 28.6%. Possible causes include blood loss from recent knee surgery and cessation of iron supplements prior to surgery. She has restarted iron supplementation to address the anemia. - Continue iron supplementation to improve hemoglobin levels  Elevated serum creatinine Serum creatinine increased to 1.42 mg/dL from normal levels in January. Possible causes include dehydration or other underlying issues. She is not taking NSAIDs, which could contribute to renal impairment. Adequate hydration will be ensured to address this issue. - Ensure adequate hydration and monitor kidney function  Post-operative status following right knee replacement Recent right knee replacement surgery. No current issues reported with the knee itself, but anemia may be related to blood loss during surgery. Recovery will be monitored for any related symptoms. - Monitor recovery and any related symptoms   She was advised to call immediately if she has any other  concerning symptoms in the interval.  The patient voices understanding of current disease status and treatment options and is in agreement with the current care plan.  All questions were answered. The patient knows to call the clinic with any problems, questions or concerns. We can certainly  see the patient much sooner if necessary.  Disclaimer: This note was dictated with voice recognition software. Similar sounding words can inadvertently be transcribed and may not be corrected upon review.

## 2023-05-09 ENCOUNTER — Telehealth: Payer: Self-pay | Admitting: Internal Medicine

## 2023-05-09 NOTE — Telephone Encounter (Signed)
 Left the patient a voicemail with appointment details scheduled around a CT scan expected date. The patient will be mailed appointment reminders.

## 2023-05-10 ENCOUNTER — Other Ambulatory Visit: Payer: Self-pay

## 2023-05-10 DIAGNOSIS — M25561 Pain in right knee: Secondary | ICD-10-CM | POA: Diagnosis not present

## 2023-05-10 DIAGNOSIS — M25661 Stiffness of right knee, not elsewhere classified: Secondary | ICD-10-CM | POA: Diagnosis not present

## 2023-05-13 DIAGNOSIS — M25561 Pain in right knee: Secondary | ICD-10-CM | POA: Diagnosis not present

## 2023-05-13 DIAGNOSIS — M25661 Stiffness of right knee, not elsewhere classified: Secondary | ICD-10-CM | POA: Diagnosis not present

## 2023-05-14 ENCOUNTER — Other Ambulatory Visit: Payer: Self-pay

## 2023-05-14 NOTE — Progress Notes (Signed)
 Specialty Pharmacy Refill Coordination Note  Linda Woods is a 73 y.o. female contacted today regarding refills of specialty medication(s) Linda Woods)   Patient requested (Patient-Rptd) Delivery   Delivery date: (Patient-Rptd) 05/17/23   Verified address: (Patient-Rptd) [address] [place], [place]   Medication will be filled on 05/16/23.

## 2023-05-15 ENCOUNTER — Ambulatory Visit (HOSPITAL_COMMUNITY)
Admission: RE | Admit: 2023-05-15 | Discharge: 2023-05-15 | Disposition: A | Discharge status: Home / Self Care | Source: Ambulatory Visit | Attending: Internal Medicine | Admitting: Internal Medicine

## 2023-05-15 DIAGNOSIS — I6529 Occlusion and stenosis of unspecified carotid artery: Secondary | ICD-10-CM | POA: Diagnosis not present

## 2023-05-15 DIAGNOSIS — R221 Localized swelling, mass and lump, neck: Secondary | ICD-10-CM | POA: Diagnosis not present

## 2023-05-15 DIAGNOSIS — C3490 Malignant neoplasm of unspecified part of unspecified bronchus or lung: Secondary | ICD-10-CM | POA: Diagnosis not present

## 2023-05-15 DIAGNOSIS — M25661 Stiffness of right knee, not elsewhere classified: Secondary | ICD-10-CM | POA: Diagnosis not present

## 2023-05-15 DIAGNOSIS — M25561 Pain in right knee: Secondary | ICD-10-CM | POA: Diagnosis not present

## 2023-05-15 MED ORDER — IOHEXOL 300 MG/ML  SOLN
60.0000 mL | Freq: Once | INTRAMUSCULAR | Status: AC | PRN
  Administered 2023-05-15: 60 mL via INTRAVENOUS

## 2023-05-17 DIAGNOSIS — M25561 Pain in right knee: Secondary | ICD-10-CM | POA: Diagnosis not present

## 2023-05-17 DIAGNOSIS — M25661 Stiffness of right knee, not elsewhere classified: Secondary | ICD-10-CM | POA: Diagnosis not present

## 2023-05-20 DIAGNOSIS — M25661 Stiffness of right knee, not elsewhere classified: Secondary | ICD-10-CM | POA: Diagnosis not present

## 2023-05-20 DIAGNOSIS — M25561 Pain in right knee: Secondary | ICD-10-CM | POA: Diagnosis not present

## 2023-05-21 DIAGNOSIS — M5416 Radiculopathy, lumbar region: Secondary | ICD-10-CM | POA: Diagnosis not present

## 2023-05-22 DIAGNOSIS — M25561 Pain in right knee: Secondary | ICD-10-CM | POA: Diagnosis not present

## 2023-05-22 DIAGNOSIS — M25661 Stiffness of right knee, not elsewhere classified: Secondary | ICD-10-CM | POA: Diagnosis not present

## 2023-05-25 ENCOUNTER — Inpatient Hospital Stay (HOSPITAL_COMMUNITY)
Admission: EM | Admit: 2023-05-25 | Discharge: 2023-05-30 | DRG: 444 | Disposition: A | Discharge status: Home Health | Attending: Family Medicine | Admitting: Family Medicine

## 2023-05-25 ENCOUNTER — Other Ambulatory Visit: Payer: Self-pay

## 2023-05-25 ENCOUNTER — Emergency Department (HOSPITAL_COMMUNITY)

## 2023-05-25 ENCOUNTER — Encounter (HOSPITAL_COMMUNITY): Payer: Self-pay | Admitting: *Deleted

## 2023-05-25 DIAGNOSIS — Z1152 Encounter for screening for COVID-19: Secondary | ICD-10-CM | POA: Diagnosis not present

## 2023-05-25 DIAGNOSIS — K828 Other specified diseases of gallbladder: Secondary | ICD-10-CM | POA: Diagnosis not present

## 2023-05-25 DIAGNOSIS — E876 Hypokalemia: Secondary | ICD-10-CM | POA: Diagnosis not present

## 2023-05-25 DIAGNOSIS — Z96653 Presence of artificial knee joint, bilateral: Secondary | ICD-10-CM | POA: Diagnosis present

## 2023-05-25 DIAGNOSIS — E039 Hypothyroidism, unspecified: Secondary | ICD-10-CM | POA: Diagnosis present

## 2023-05-25 DIAGNOSIS — K819 Cholecystitis, unspecified: Secondary | ICD-10-CM | POA: Diagnosis not present

## 2023-05-25 DIAGNOSIS — J4520 Mild intermittent asthma, uncomplicated: Secondary | ICD-10-CM

## 2023-05-25 DIAGNOSIS — K810 Acute cholecystitis: Principal | ICD-10-CM | POA: Diagnosis present

## 2023-05-25 DIAGNOSIS — K8020 Calculus of gallbladder without cholecystitis without obstruction: Principal | ICD-10-CM | POA: Diagnosis present

## 2023-05-25 DIAGNOSIS — R1011 Right upper quadrant pain: Secondary | ICD-10-CM

## 2023-05-25 DIAGNOSIS — Z833 Family history of diabetes mellitus: Secondary | ICD-10-CM

## 2023-05-25 DIAGNOSIS — I4719 Other supraventricular tachycardia: Secondary | ICD-10-CM | POA: Insufficient documentation

## 2023-05-25 DIAGNOSIS — K838 Other specified diseases of biliary tract: Secondary | ICD-10-CM | POA: Diagnosis not present

## 2023-05-25 DIAGNOSIS — K5730 Diverticulosis of large intestine without perforation or abscess without bleeding: Secondary | ICD-10-CM | POA: Diagnosis not present

## 2023-05-25 DIAGNOSIS — J441 Chronic obstructive pulmonary disease with (acute) exacerbation: Secondary | ICD-10-CM | POA: Diagnosis not present

## 2023-05-25 DIAGNOSIS — Z87442 Personal history of urinary calculi: Secondary | ICD-10-CM | POA: Diagnosis not present

## 2023-05-25 DIAGNOSIS — Z8249 Family history of ischemic heart disease and other diseases of the circulatory system: Secondary | ICD-10-CM

## 2023-05-25 DIAGNOSIS — K449 Diaphragmatic hernia without obstruction or gangrene: Secondary | ICD-10-CM | POA: Diagnosis present

## 2023-05-25 DIAGNOSIS — C3492 Malignant neoplasm of unspecified part of left bronchus or lung: Secondary | ICD-10-CM | POA: Diagnosis not present

## 2023-05-25 DIAGNOSIS — G2581 Restless legs syndrome: Secondary | ICD-10-CM | POA: Diagnosis present

## 2023-05-25 DIAGNOSIS — K219 Gastro-esophageal reflux disease without esophagitis: Secondary | ICD-10-CM | POA: Diagnosis not present

## 2023-05-25 DIAGNOSIS — J9601 Acute respiratory failure with hypoxia: Secondary | ICD-10-CM | POA: Insufficient documentation

## 2023-05-25 DIAGNOSIS — E782 Mixed hyperlipidemia: Secondary | ICD-10-CM | POA: Insufficient documentation

## 2023-05-25 DIAGNOSIS — Z85118 Personal history of other malignant neoplasm of bronchus and lung: Secondary | ICD-10-CM

## 2023-05-25 DIAGNOSIS — R109 Unspecified abdominal pain: Secondary | ICD-10-CM | POA: Insufficient documentation

## 2023-05-25 DIAGNOSIS — Z803 Family history of malignant neoplasm of breast: Secondary | ICD-10-CM | POA: Diagnosis not present

## 2023-05-25 DIAGNOSIS — R0989 Other specified symptoms and signs involving the circulatory and respiratory systems: Secondary | ICD-10-CM | POA: Diagnosis not present

## 2023-05-25 DIAGNOSIS — Z8049 Family history of malignant neoplasm of other genital organs: Secondary | ICD-10-CM | POA: Diagnosis not present

## 2023-05-25 DIAGNOSIS — I10 Essential (primary) hypertension: Secondary | ICD-10-CM | POA: Diagnosis not present

## 2023-05-25 DIAGNOSIS — Z90710 Acquired absence of both cervix and uterus: Secondary | ICD-10-CM

## 2023-05-25 DIAGNOSIS — D638 Anemia in other chronic diseases classified elsewhere: Secondary | ICD-10-CM | POA: Diagnosis present

## 2023-05-25 DIAGNOSIS — Z79890 Hormone replacement therapy (postmenopausal): Secondary | ICD-10-CM

## 2023-05-25 DIAGNOSIS — J45909 Unspecified asthma, uncomplicated: Secondary | ICD-10-CM | POA: Diagnosis present

## 2023-05-25 DIAGNOSIS — Z7901 Long term (current) use of anticoagulants: Secondary | ICD-10-CM

## 2023-05-25 DIAGNOSIS — R0602 Shortness of breath: Secondary | ICD-10-CM | POA: Diagnosis not present

## 2023-05-25 DIAGNOSIS — C3490 Malignant neoplasm of unspecified part of unspecified bronchus or lung: Secondary | ICD-10-CM | POA: Diagnosis not present

## 2023-05-25 DIAGNOSIS — R1010 Upper abdominal pain, unspecified: Secondary | ICD-10-CM | POA: Diagnosis not present

## 2023-05-25 LAB — COMPREHENSIVE METABOLIC PANEL WITH GFR
ALT: 44 U/L (ref 0–44)
AST: 48 U/L — ABNORMAL HIGH (ref 15–41)
Albumin: 3.9 g/dL (ref 3.5–5.0)
Alkaline Phosphatase: 129 U/L — ABNORMAL HIGH (ref 38–126)
Anion gap: 12 (ref 5–15)
BUN: 12 mg/dL (ref 8–23)
CO2: 23 mmol/L (ref 22–32)
Calcium: 9.5 mg/dL (ref 8.9–10.3)
Chloride: 101 mmol/L (ref 98–111)
Creatinine, Ser: 1.11 mg/dL — ABNORMAL HIGH (ref 0.44–1.00)
GFR, Estimated: 53 mL/min — ABNORMAL LOW (ref >60)
Glucose, Bld: 149 mg/dL — ABNORMAL HIGH (ref 70–99)
Potassium: 3.9 mmol/L (ref 3.5–5.1)
Sodium: 136 mmol/L (ref 135–145)
Total Bilirubin: 0.6 mg/dL (ref 0.0–1.2)
Total Protein: 6.9 g/dL (ref 6.5–8.1)

## 2023-05-25 LAB — LIPASE, BLOOD: Lipase: 62 U/L — ABNORMAL HIGH (ref 11–51)

## 2023-05-25 LAB — CBC
HCT: 33.6 % — ABNORMAL LOW (ref 36.0–46.0)
Hemoglobin: 10.5 g/dL — ABNORMAL LOW (ref 12.0–15.0)
MCH: 29.6 pg (ref 26.0–34.0)
MCHC: 31.3 g/dL (ref 30.0–36.0)
MCV: 94.6 fL (ref 80.0–100.0)
Platelets: 165 10*3/uL (ref 150–400)
RBC: 3.55 MIL/uL — ABNORMAL LOW (ref 3.87–5.11)
RDW: 13.4 % (ref 11.5–15.5)
WBC: 9.6 10*3/uL (ref 4.0–10.5)
nRBC: 0 % (ref 0.0–0.2)

## 2023-05-25 LAB — I-STAT CHEM 8, ED
BUN: 10 mg/dL (ref 8–23)
Calcium, Ion: 1.2 mmol/L (ref 1.15–1.40)
Chloride: 103 mmol/L (ref 98–111)
Creatinine, Ser: 1.2 mg/dL — ABNORMAL HIGH (ref 0.44–1.00)
Glucose, Bld: 149 mg/dL — ABNORMAL HIGH (ref 70–99)
HCT: 33 % — ABNORMAL LOW (ref 36.0–46.0)
Hemoglobin: 11.2 g/dL — ABNORMAL LOW (ref 12.0–15.0)
Potassium: 3.9 mmol/L (ref 3.5–5.1)
Sodium: 137 mmol/L (ref 135–145)
TCO2: 23 mmol/L (ref 22–32)

## 2023-05-25 MED ORDER — LACTATED RINGERS IV BOLUS
1000.0000 mL | Freq: Once | INTRAVENOUS | Status: AC
  Administered 2023-05-26: 1000 mL via INTRAVENOUS

## 2023-05-25 MED ORDER — HYDROMORPHONE HCL 1 MG/ML IJ SOLN
0.5000 mg | Freq: Once | INTRAMUSCULAR | Status: AC
  Administered 2023-05-25: 0.5 mg via INTRAVENOUS
  Filled 2023-05-25: qty 0.5

## 2023-05-25 MED ORDER — IOHEXOL 300 MG/ML  SOLN
80.0000 mL | Freq: Once | INTRAMUSCULAR | Status: AC | PRN
  Administered 2023-05-25: 80 mL via INTRAVENOUS

## 2023-05-25 MED ORDER — PIPERACILLIN-TAZOBACTAM 3.375 G IVPB 30 MIN
3.3750 g | Freq: Once | INTRAVENOUS | Status: AC
  Administered 2023-05-25: 3.375 g via INTRAVENOUS
  Filled 2023-05-25: qty 50

## 2023-05-25 MED ORDER — ONDANSETRON HCL 4 MG/2ML IJ SOLN
4.0000 mg | Freq: Once | INTRAMUSCULAR | Status: AC
  Administered 2023-05-25: 4 mg via INTRAVENOUS
  Filled 2023-05-25: qty 2

## 2023-05-25 MED ORDER — ONDANSETRON HCL 4 MG/2ML IJ SOLN
4.0000 mg | Freq: Once | INTRAMUSCULAR | Status: AC
Start: 2023-05-25 — End: 2023-05-25
  Administered 2023-05-25: 4 mg via INTRAVENOUS
  Filled 2023-05-25: qty 2

## 2023-05-25 MED ORDER — LACTATED RINGERS IV BOLUS
1000.0000 mL | Freq: Once | INTRAVENOUS | Status: AC
  Administered 2023-05-25: 1000 mL via INTRAVENOUS

## 2023-05-25 MED ORDER — MORPHINE SULFATE (PF) 4 MG/ML IV SOLN
4.0000 mg | Freq: Once | INTRAVENOUS | Status: AC
  Administered 2023-05-25: 4 mg via INTRAVENOUS
  Filled 2023-05-25: qty 1

## 2023-05-25 NOTE — ED Triage Notes (Signed)
 Right upper abdominal pain that has gradually has become worse since this morning. Decreased appetite for several weeks, last normal BM on Monday. Vomiting yesterday.

## 2023-05-25 NOTE — H&P (Incomplete)
 History and Physical    Patient: Linda Woods ZOX:[id number] DOB: [birth year] DOA: 05/25/2023 DOS: the patient was seen and examined on 05/26/2023 PCP: Assunta Found, MD  Patient coming from: Home  Chief Complaint:  Chief Complaint  Patient presents with   Abdominal Pain   HPI: Linda Woods is a 73 y.o. female with medical history significant of arthritis, hypothyroidism, hypertension, GERD, asthma who presents to the emergency department due to right-sided abdominal pain which started this morning, pain was dull, nonradiating and constant with no known alleviating or exacerbating factors.  Pain worsened through the day, so she decided to go to the ED for further evaluation and management.  She denies chest pain, shortness of breath, fever, chills.  ED Course:  In the emergency department, temperature was 97.35F, other vital signs were within normal range.  Workup in the ED showed normocytic anemia.  BMP was normal except for blood glucose of 149 and creatinine of 1.11.  AST 48, ALP 129 CT abdomen and pelvis with contrast showed acute cholecystitis.  Moderate hiatal hernia.  Fluid-filled distal esophagus suggesting changes of GERD General Surgery (Dr.Pappayliou) was consulted and recommended admitting patient and starting on IV antibiotics, n.p.o. and RUQ ultrasound in the morning and with plan to consult on patient in the morning. Patient was treated with IV Zosyn, Zofran morphine and IV hydration was provided. Hospitalist was asked to admit patient for further evaluation and management.    Review of Systems: Review of systems as noted in the HPI. All other systems reviewed and are negative.   Past Medical History:  Diagnosis Date   Anemia    as a child   Anxiety    Asthma 10/19/2020   Bursitis    Chronic reflux esophagitis    Complication of anesthesia    Diarrhea, functional    Diverticulosis    GERD (gastroesophageal reflux disease)    History of kidney stones     Hypertension    Knee pain    right knee-seeing ortho   lung ca 09/2019   Osteoarthritis    Plantar fasciitis    Pneumonia    PONV (postoperative nausea and vomiting)    has used the patch before and that helps   Pure hypercholesterolemia    RLS (restless legs syndrome)    Superficial vein thrombosis    Thyroid disease    hypothyroid   Past Surgical History:  Procedure Laterality Date   APPENDECTOMY     BRONCHIAL BIOPSY  10/20/2019   Procedure: BRONCHIAL BIOPSIES;  Surgeon: Leslye Peer, MD;  Location: [facility] ENDOSCOPY;  Service: Pulmonary;;   BRONCHIAL BRUSHINGS  10/20/2019   Procedure: BRONCHIAL BRUSHINGS;  Surgeon: Leslye Peer, MD;  Location: [facility] ENDOSCOPY;  Service: Pulmonary;;   BRONCHIAL NEEDLE ASPIRATION BIOPSY  10/20/2019   Procedure: BRONCHIAL NEEDLE ASPIRATION BIOPSIES;  Surgeon: Leslye Peer, MD;  Location: [facility] ENDOSCOPY;  Service: Pulmonary;;   BRONCHIAL WASHINGS  10/20/2019   Procedure: BRONCHIAL WASHINGS;  Surgeon: Leslye Peer, MD;  Location: [facility] ENDOSCOPY;  Service: Pulmonary;;   CARPAL TUNNEL RELEASE Right 2011   Dr. Amanda Pea   COLPORRHAPHY     posterior   HAND SURGERY Right 2016   Nerve surgery, Dr. Amanda Pea   OVARIAN CYST REMOVAL     RECTOCELE REPAIR  2011   w/TVH and sling   TONSILLECTOMY     TONSILLECTOMY     TOTAL KNEE ARTHROPLASTY Left 09/09/2018   Procedure: TOTAL KNEE ARTHROPLASTY, CORTISONE INJECTION RIGHT KNEE;  Surgeon:  Durene Romans, MD;  Location: [facility] ORS;  Service: Orthopedics;  Laterality: Left;  70 mins   TOTAL KNEE ARTHROPLASTY Right 04/16/2023   Procedure: TOTAL KNEE ARTHROPLASTY;  Surgeon: Durene Romans, MD;  Location: [facility] ORS;  Service: Orthopedics;  Laterality: Right;   TOTAL VAGINAL HYSTERECTOMY  10/18/2009   rectocele repair, sling   TUBAL LIGATION Bilateral    VARICOSE VEIN SURGERY     VIDEO BRONCHOSCOPY WITH ENDOBRONCHIAL NAVIGATION N/A 10/20/2019   Procedure: VIDEO BRONCHOSCOPY WITH ENDOBRONCHIAL NAVIGATION;  Surgeon: Leslye Peer,  MD;  Location: [facility] ENDOSCOPY;  Service: Pulmonary;  Laterality: N/A;    Social History:  reports that she has never smoked. She has never used smokeless tobacco. She reports current alcohol use of about 1.0 standard drink of alcohol per week. She reports that she does not use drugs.   No Known Allergies  Family History  Problem Relation Age of Onset   Cervical cancer Mother    Heart failure Mother    Heart failure Father    Diabetes Father    Cancer Brother    Breast cancer Maternal Aunt    Breast cancer Paternal Aunt      Prior to Admission medications   Medication Sig Start Date End Date Taking? Authorizing Provider  Tiotropium Bromide-Olodaterol 2.5-2.5 MCG/ACT AERS Inhale 2 puffs into the lungs daily. 04/16/23   Glenford Bayley, NP  albuterol (VENTOLIN HFA) 108 (90 Base) MCG/ACT inhaler Inhale 2 puffs into the lungs every 6 (six) hours as needed for wheezing or shortness of breath. 01/21/23   Valentino Nose, NP  chlorhexidine (HIBICLENS) 4 % external liquid Apply 15 mLs (1 Application total) topically as directed for 30 doses. Use as directed daily for 5 days every other week for 6 weeks. 04/16/23   Cassandria Anger, PA-C  cholecalciferol (VITAMIN D3) 25 MCG (1000 UNIT) tablet Take 1,000 Units by mouth daily. Pt states she take 2000 units/day    [provider]  cyclobenzaprine (FLEXERIL) 10 MG tablet Take 1 tablet (10 mg total) by mouth 3 (three) times daily as needed for muscle spasms. 04/17/23   Cassandria Anger, PA-C  famotidine (PEPCID) 40 MG tablet Take 40 mg by mouth at bedtime.    [provider]  ferrous sulfate 325 (65 FE) MG tablet Take 325 mg by mouth daily with breakfast.    [provider]  fexofenadine (ALLEGRA) 180 MG tablet Take 180 mg by mouth daily as needed for allergies.    [provider]  fluticasone (FLONASE) 50 MCG/ACT nasal spray Place 1 spray into both nostrils as needed for allergies. 10/01/19   [provider]  levothyroxine (SYNTHROID) 100 MCG tablet Take 1 tablet (100 mcg total) by mouth daily. One po qd 11/13/22   Jerene Bears, MD  lidocaine (LIDODERM) 5 % Place 1 patch onto the skin daily. 04/26/20   [provider]  loperamide (IMODIUM) 2 MG capsule Take 2 mg by mouth every 6 (six) hours as needed for diarrhea or loose stools. 12/24/20   [provider]  LORazepam (ATIVAN) 0.5 MG tablet Take 0.5 mg by mouth at bedtime.     [provider]  MAGNESIUM PO Take 1 tablet by mouth daily.    [provider]  melatonin 5 MG TABS Take 5 mg by mouth at bedtime as needed (sleep).    [provider]  metoprolol succinate (TOPROL-XL) 50 MG 24 hr tablet Take 1 tablet (50 mg total) by mouth daily.  01/02/23   Strader, Lennart Pall, PA-C  metoprolol tartrate (LOPRESSOR) 100 MG tablet Take 1 tablet (100 mg total) by mouth as directed. Take 2 hours prior to CT Scan ( Hold Toprol the day of CT Scan ) 03/07/23   Strader, Lennart Pall, PA-C  MYRBETRIQ 50 MG TB24 tablet Take 50 mg by mouth daily. 02/08/21   [provider]  osimertinib mesylate (TAGRISSO) 80 MG tablet TAKE 1 TABLET BY MOUTH DAILY. 04/19/23 04/18/24  Si Gaul, MD  oxyCODONE (OXY IR/ROXICODONE) 5 MG immediate release tablet Take 1-2 tablets (5-10 mg total) by mouth every 4 (four) hours as needed for severe pain (pain score 7-10). 04/17/23   Cassandria Anger, PA-C  oxymetazoline (AFRIN) 0.05 % nasal spray Place 1 spray into both nostrils daily as needed for congestion.    [provider]  polyethylene glycol (MIRALAX / GLYCOLAX) 17 g packet Take 17 g by mouth 2 (two) times daily. 04/17/23   Cassandria Anger, PA-C  POTASSIUM PO Take 1 tablet by mouth daily.    [provider]  PREMARIN vaginal cream Place 1 applicator vaginally as needed (urinary issues). 12/19/21   [provider]  RABEprazole (ACIPHEX) 20 MG tablet Take 1 tablet (20 mg total) by mouth 2 (two) times a  week. Patient taking differently: Take 20 mg by mouth daily. 03/08/16   Jerene Bears, MD  rivaroxaban (XARELTO) 10 MG TABS tablet Take 1 tablet (10 mg total) by mouth daily with breakfast for 21 days. 04/17/23 05/08/23  Cassandria Anger, PA-C  rosuvastatin (CRESTOR) 5 MG tablet Take 1 tablet (5 mg total) by mouth daily. 01/02/23   Strader, Lennart Pall, PA-C  triamcinolone cream (KENALOG) 0.1 % Apply 1 Application topically 2 (two) times daily. As needed for itching/rash Patient taking differently: Apply 1 Application topically as needed (rash/itching). 07/20/22   Heilingoetter, Cassandra L, PA-C  vitamin B-12 (CYANOCOBALAMIN) 1000 MCG tablet Take 1,000 mcg by mouth daily.    [provider]    Physical Exam: BP 123/83 (BP Location: Left Arm)   Pulse (!) 107   Temp 97.8 F (36.6 C) (Oral)   Resp 18   Ht 5\' 4"  (1.626 m)   Wt 66.7 kg   LMP 08/21/2002   SpO2 92%   BMI 25.23 kg/m   General: 73 y.o. year-old female well developed well nourished in no acute distress.  Alert and oriented x3. HEENT: NCAT, EOMI Neck: Supple, trachea medial Cardiovascular: Regular rate and rhythm with no rubs or gallops.  No thyromegaly or JVD noted.  No lower extremity edema. 2/4 pulses in all 4 extremities. Respiratory: Clear to auscultation with no wheezes or rales. Good inspiratory effort. Abdomen: Soft, tender to palpation of RUQ without guarding.  Nondistended with normal bowel sounds x4 quadrants. Muskuloskeletal: No cyanosis, clubbing or edema noted bilaterally Neuro: CN II-XII intact, strength 5/5 x 4, sensation, reflexes intact Skin: No ulcerative lesions noted or rashes Psychiatry: Judgement and insight appear normal. Mood is appropriate for condition and setting          Labs on Admission:  Basic Metabolic Panel: Recent Labs  Lab 05/25/23 2204 05/25/23 2209  NA 136 137  K 3.9 3.9  CL 101 103  CO2 23  --   GLUCOSE 149* 149*  BUN 12 10  CREATININE 1.11* 1.20*  CALCIUM 9.5  --     Liver Function Tests: Recent Labs  Lab 05/25/23 2204  AST 48*  ALT 44  ALKPHOS 129*  BILITOT  0.6  PROT 6.9  ALBUMIN 3.9   Recent Labs  Lab 05/25/23 2204  LIPASE 62*   No results for input(s): "AMMONIA" in the last 168 hours. CBC: Recent Labs  Lab 05/25/23 2204 05/25/23 2209  WBC 9.6  --   HGB 10.5* 11.2*  HCT 33.6* 33.0*  MCV 94.6  --   PLT 165  --    Cardiac Enzymes: No results for input(s): "CKTOTAL", "CKMB", "CKMBINDEX", "TROPONINI" in the last 168 hours.  BNP (last 3 results) No results for input(s): "BNP" in the last 8760 hours.  ProBNP (last 3 results) No results for input(s): "PROBNP" in the last 8760 hours.  CBG: No results for input(s): "GLUCAP" in the last 168 hours.  Radiological Exams on Admission: CT ABDOMEN PELVIS W CONTRAST Result Date: 05/25/2023 CLINICAL DATA:  Acute nonlocalized abdominal pain, right upper quadrant abdominal pain EXAM: CT ABDOMEN AND PELVIS WITH CONTRAST TECHNIQUE: Multidetector CT imaging of the abdomen and pelvis was performed using the standard protocol following bolus administration of intravenous contrast. RADIATION DOSE REDUCTION: This exam was performed according to the departmental dose-optimization program which includes automated exposure control, adjustment of the mA and/or kV according to patient size and/or use of iterative reconstruction technique. CONTRAST:  80mL OMNIPAQUE IOHEXOL 300 MG/ML  SOLN COMPARISON:  10/14/2019 FINDINGS: Lower chest: No acute abnormality. Moderate hiatal hernia. The distal esophagus is fluid-filled suggesting changes of gastroesophageal reflux. 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 Hepatobiliary: Mild perihepatic  ascites. The gallbladder is distended, there is gallbladder wall thickening, pericholecystic edema within the gallbladder fossa, and extensive peri-cholecystic inflammatory stranding in keeping with changes of acute cholecystitis. Liver unremarkable. No intra or extrahepatic biliary ductal dilation. Pancreas: Unremarkable Spleen: Unremarkable Adrenals/Urinary Tract: Adrenal glands are unremarkable. Simple cortical cysts are seen within the right kidney for which no follow-up imaging is recommended. The kidneys are otherwise unremarkable. Bladder unremarkable. Stomach/Bowel: Moderate descending and sigmoid colonic diverticulosis. Stomach, small bowel, and large bowel are otherwise unremarkable. Appendix absent. No evidence of obstruction or focal inflammation. No free intraperitoneal gas or fluid. Vascular/Lymphatic: Aortic atherosclerosis. No enlarged abdominal or pelvic lymph nodes. Reproductive: Status post hysterectomy. No adnexal masses. Other: No abdominal wall hernia Musculoskeletal: Chronic anterior wedge compression deformity of L1 is seen with approximately 50% loss of height anteriorly, stable from PET CT of 03/01/2023. No retropulsion. Multiple sclerotic lesions have developed within the a spine at T11, L5, and S2 which are stable since PET CT examination of 03/01/2023 and are compatible with treated metastatic disease. IMPRESSION: 1. Acute cholecystitis. 2. Moderate hiatal hernia. Fluid-filled distal esophagus suggesting changes of gastroesophageal reflux. 3. Moderate distal colonic diverticulosis without superimposed acute inflammatory change. 4. Stable sclerotic lesions within the spine at T11, L5, and S2 compatible with treated metastatic disease. Aortic Atherosclerosis (ICD10-I70.0). Electronically Signed   By: Helyn Numbers M.D.   On: 05/25/2023 23:00    EKG: I independently viewed the EKG done and my findings are as followed: EKG was not done in the ED  Assessment/Plan Present on  Admission:  Acute cholecystitis  Acquired hypothyroidism  Essential hypertension  Gastroesophageal reflux disease without esophagitis  Asthma, chronic  Adenocarcinoma of left lung, stage 3 (HCC)  Principal Problem:   Acute cholecystitis Active Problems:   Essential  hypertension   Acquired hypothyroidism   Gastroesophageal reflux disease without esophagitis   Adenocarcinoma of left lung, stage 3 (HCC)   Asthma, chronic   Abdominal pain   Mixed hyperlipidemia  Abdominal pain due to acute cholecystitis Continue IV hydration Continue IV Zosyn  Continue IV Dilaudid 0.5 mg q.3h p.r.n. for moderate to severe pain Continue IV Zofran p.r.n. Patient was placed n.p.o. in anticipation for surgical intervention in the morning Obtain blood culture x2 General Surgery (Dr. 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 Shirline Frees with last visit being on 03/13/2023 Continue Tagrisso  DVT prophylaxis: SCDs  Code Status: Full code  Family Communication: None at bedside  Consults: General Surgery  Severity of Illness: The appropriate patient status for this patient is INPATIENT. Inpatient status is judged to be reasonable and necessary in order to provide the required intensity of service to ensure the patient's safety. The patient's presenting symptoms, physical exam findings, and initial radiographic and laboratory data in the context of their chronic comorbidities is felt to place them at high risk for further clinical deterioration. Furthermore, it is not anticipated that the patient will be medically  stable for discharge from the hospital within 2 midnights of admission.   * I certify that at the point of admission it is my clinical judgment that the patient will require inpatient hospital care spanning beyond 2 midnights from the point of admission due to high intensity of service, high risk for further deterioration and high frequency of surveillance required.*  Author: Frankey Shown, DO 05/26/2023 3:38 AM  For on call review [email or web address].

## 2023-05-25 NOTE — ED Provider Notes (Addendum)
 [place] EMERGENCY DEPARTMENT AT [facility] Provider Note   CSN: [id number] Arrival date & time: 05/25/23  2134     History  Chief Complaint  Patient presents with   Abdominal Pain    Linda Woods is a 73 y.o. female.  Pt with c/o right sided abdominal pain onset earlier this AM.  Pain constant, dull, non radiating, without specific exacerbating or alleviating factors, progressive during day, now severe. No hx same pain. No hx gallstones. ?remote hx kidney stones, different.  No midline/back pain. No fever or chills. No trauma/strain to area. No vaginal bleeding or discharge. No rectal bleeding or melena. Last bm yesterday. +nausea. No emesis.   The history is provided by the patient and medical records.  Abdominal Pain Associated symptoms: nausea   Associated symptoms: no chest pain, no chills, no cough, no diarrhea, no dysuria, no fever, no hematuria, no shortness of breath, no sore throat and no vomiting        Home Medications Prior to Admission medications   Medication Sig Start Date End Date Taking? Authorizing Provider  Tiotropium Bromide-Olodaterol 2.5-2.5 MCG/ACT AERS Inhale 2 puffs into the lungs daily. 04/16/23   Glenford Bayley, NP  albuterol (VENTOLIN HFA) 108 (90 Base) MCG/ACT inhaler Inhale 2 puffs into the lungs every 6 (six) hours as needed for wheezing or shortness of breath. 01/21/23   Valentino Nose, NP  chlorhexidine (HIBICLENS) 4 % external liquid Apply 15 mLs (1 Application total) topically as directed for 30 doses. Use as directed daily for 5 days every other week for 6 weeks. 04/16/23   Cassandria Anger, PA-C  cholecalciferol (VITAMIN D3) 25 MCG (1000 UNIT) tablet Take 1,000 Units by mouth daily. Pt states she take 2000 units/day    [provider]  cyclobenzaprine (FLEXERIL) 10 MG tablet Take 1 tablet (10 mg total) by mouth 3 (three) times daily as needed for muscle spasms. 04/17/23   Cassandria Anger, PA-C  famotidine  (PEPCID) 40 MG tablet Take 40 mg by mouth at bedtime.    [provider]  ferrous sulfate 325 (65 FE) MG tablet Take 325 mg by mouth daily with breakfast.    [provider]  fexofenadine (ALLEGRA) 180 MG tablet Take 180 mg by mouth daily as needed for allergies.    [provider]  fluticasone (FLONASE) 50 MCG/ACT nasal spray Place 1 spray into both nostrils as needed for allergies. 10/01/19   [provider]  levothyroxine (SYNTHROID) 100 MCG tablet Take 1 tablet (100 mcg total) by mouth daily. One po qd 11/13/22   Jerene Bears, MD  lidocaine (LIDODERM) 5 % Place 1 patch onto the skin daily. 04/26/20   [provider]  loperamide (IMODIUM) 2 MG capsule Take 2 mg by mouth every 6 (six) hours as needed for diarrhea or loose stools. 12/24/20   [provider]  LORazepam (ATIVAN) 0.5 MG tablet Take 0.5 mg by mouth at bedtime.     [provider]  MAGNESIUM PO Take 1 tablet by mouth daily.    [provider]  melatonin 5 MG TABS Take 5 mg by mouth at bedtime as needed (sleep).    [provider]  metoprolol succinate (TOPROL-XL) 50 MG 24 hr tablet Take 1 tablet (50 mg total) by mouth daily. 01/02/23   Strader, Lennart Pall, PA-C  metoprolol tartrate (LOPRESSOR) 100 MG tablet Take 1 tablet (100 mg total) by mouth as directed. Take 2 hours prior to CT  Scan ( Hold Toprol the day of CT Scan ) 03/07/23   Strader, Lennart Pall, PA-C  MYRBETRIQ 50 MG TB24 tablet Take 50 mg by mouth daily. 02/08/21   [provider]  osimertinib mesylate (TAGRISSO) 80 MG tablet TAKE 1 TABLET BY MOUTH DAILY. 04/19/23 04/18/24  Si Gaul, MD  oxyCODONE (OXY IR/ROXICODONE) 5 MG immediate release tablet Take 1-2 tablets (5-10 mg total) by mouth every 4 (four) hours as needed for severe pain (pain score 7-10). 04/17/23   Cassandria Anger, PA-C  oxymetazoline (AFRIN) 0.05 % nasal spray Place 1 spray into both nostrils daily as needed for congestion.     [provider]  polyethylene glycol (MIRALAX / GLYCOLAX) 17 g packet Take 17 g by mouth 2 (two) times daily. 04/17/23   Cassandria Anger, PA-C  POTASSIUM PO Take 1 tablet by mouth daily.    [provider]  PREMARIN vaginal cream Place 1 applicator vaginally as needed (urinary issues). 12/19/21   [provider]  RABEprazole (ACIPHEX) 20 MG tablet Take 1 tablet (20 mg total) by mouth 2 (two) times a week. Patient taking differently: Take 20 mg by mouth daily. 03/08/16   Jerene Bears, MD  rivaroxaban (XARELTO) 10 MG TABS tablet Take 1 tablet (10 mg total) by mouth daily with breakfast for 21 days. 04/17/23 05/08/23  Cassandria Anger, PA-C  rosuvastatin (CRESTOR) 5 MG tablet Take 1 tablet (5 mg total) by mouth daily. 01/02/23   Strader, Lennart Pall, PA-C  triamcinolone cream (KENALOG) 0.1 % Apply 1 Application topically 2 (two) times daily. As needed for itching/rash Patient taking differently: Apply 1 Application topically as needed (rash/itching). 07/20/22   Heilingoetter, Cassandra L, PA-C  vitamin B-12 (CYANOCOBALAMIN) 1000 MCG tablet Take 1,000 mcg by mouth daily.    [provider]      Allergies    Patient has no known allergies.    Review of Systems   Review of Systems  Constitutional:  Negative for chills and fever.  HENT:  Negative for sore throat.   Respiratory:  Negative for cough and shortness of breath.   Cardiovascular:  Negative for chest pain and leg swelling.  Gastrointestinal:  Positive for abdominal pain and nausea. Negative for blood in stool, diarrhea and vomiting.  Genitourinary:  Negative for dysuria, flank pain and hematuria.  Musculoskeletal:  Negative for back pain and neck pain.  Skin:  Negative for rash.  Neurological:  Negative for headaches.    Physical Exam Updated Vital Signs BP 100/78 (BP Location: Right Arm)   Pulse 97   Temp (!) 97.2 F (36.2 C) (Temporal)   Resp 18   Ht 1.626 m (5\' 4" )   Wt 66.7 kg   LMP  08/21/2002   SpO2 99%   BMI 25.23 kg/m  Physical Exam Vitals and nursing note reviewed.  Constitutional:      Appearance: Normal appearance. She is well-developed.  HENT:     Head: Atraumatic.     Nose: Nose normal.     Mouth/Throat:     Mouth: Mucous membranes are moist.  Eyes:     General: No scleral icterus.    Conjunctiva/sclera: Conjunctivae normal.  Neck:     Trachea: No tracheal deviation.  Cardiovascular:     Rate and Rhythm: Normal rate and regular rhythm.     Pulses: Normal pulses.     Heart sounds: Normal heart sounds. No murmur heard.    No friction rub. No gallop.  Pulmonary:  Effort: Pulmonary effort is normal. No respiratory distress.     Breath sounds: Normal breath sounds.  Abdominal:     General: Bowel sounds are normal. There is no distension.     Palpations: Abdomen is soft. There is no mass.     Tenderness: There is abdominal tenderness. There is no guarding or rebound.     Hernia: No hernia is present.     Comments: Right abd tenderness.  Genitourinary:    Comments: No cva tenderness.  Musculoskeletal:        General: No swelling or tenderness.     Cervical back: Normal range of motion and neck supple. No rigidity. No muscular tenderness.  Skin:    General: Skin is warm and dry.     Findings: No rash.  Neurological:     Mental Status: She is alert.     Comments: Alert, speech normal.   Psychiatric:        Mood and Affect: Mood normal.     ED Results / Procedures / Treatments   Labs (all labs ordered are listed, but only abnormal results are displayed) Results for orders placed or performed during the hospital encounter of 05/25/23  Lipase, blood   Collection Time: 05/25/23 10:04 PM  Result Value Ref Range   Lipase 62 (H) 11 - 51 U/L  Comprehensive metabolic panel   Collection Time: 05/25/23 10:04 PM  Result Value Ref Range   Sodium 136 135 - 145 mmol/L   Potassium 3.9 3.5 - 5.1 mmol/L   Chloride 101 98 - 111 mmol/L   CO2 23 22 -  32 mmol/L   Glucose, Bld 149 (H) 70 - 99 mg/dL   BUN 12 8 - 23 mg/dL   Creatinine, Ser 5.78 (H) 0.44 - 1.00 mg/dL   Calcium 9.5 8.9 - 46.9 mg/dL   Total Protein 6.9 6.5 - 8.1 g/dL   Albumin 3.9 3.5 - 5.0 g/dL   AST 48 (H) 15 - 41 U/L   ALT 44 0 - 44 U/L   Alkaline Phosphatase 129 (H) 38 - 126 U/L   Total Bilirubin 0.6 0.0 - 1.2 mg/dL   GFR, Estimated 53 (L) >60 mL/min   Anion gap 12 5 - 15  CBC   Collection Time: 05/25/23 10:04 PM  Result Value Ref Range   WBC 9.6 4.0 - 10.5 K/uL   RBC 3.55 (L) 3.87 - 5.11 MIL/uL   Hemoglobin 10.5 (L) 12.0 - 15.0 g/dL   HCT 62.9 (L) 52.8 - 41.3 %   MCV 94.6 80.0 - 100.0 fL   MCH 29.6 26.0 - 34.0 pg   MCHC 31.3 30.0 - 36.0 g/dL   RDW 24.4 01.0 - 27.2 %   Platelets 165 150 - 400 K/uL   nRBC 0.0 0.0 - 0.2 %  I-stat chem 8, ED   Collection Time: 05/25/23 10:09 PM  Result Value Ref Range   Sodium 137 135 - 145 mmol/L   Potassium 3.9 3.5 - 5.1 mmol/L   Chloride 103 98 - 111 mmol/L   BUN 10 8 - 23 mg/dL   Creatinine, Ser 5.36 (H) 0.44 - 1.00 mg/dL   Glucose, Bld 644 (H) 70 - 99 mg/dL   Calcium, Ion 0.34 1.15 - 1.40 mmol/L   TCO2 23 22 - 32 mmol/L   Hemoglobin 11.2 (L) 12.0 - 15.0 g/dL   HCT 74.2 (L) 59.5 - 63.8 %      EKG None  Radiology CT ABDOMEN PELVIS W CONTRAST Result Date:  05/25/2023 CLINICAL DATA:  Acute nonlocalized abdominal pain, right upper quadrant abdominal pain EXAM: CT ABDOMEN AND PELVIS WITH CONTRAST TECHNIQUE: Multidetector CT imaging of the abdomen and pelvis was performed using the standard protocol following bolus administration of intravenous contrast. RADIATION DOSE REDUCTION: This exam was performed according to the departmental dose-optimization program which includes automated exposure control, adjustment of the mA and/or kV according to patient size and/or use of iterative reconstruction technique. CONTRAST:  80mL OMNIPAQUE IOHEXOL 300 MG/ML  SOLN COMPARISON:  10/14/2019 FINDINGS: Lower chest: No acute abnormality.  Moderate hiatal hernia. The distal esophagus is fluid-filled suggesting changes of gastroesophageal reflux. 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 Hepatobiliary: Mild perihepatic ascites. The gallbladder is distended, there is gallbladder wall thickening, pericholecystic edema within the gallbladder fossa, and extensive peri-cholecystic inflammatory stranding in keeping with changes of acute cholecystitis. Liver unremarkable. No intra or extrahepatic biliary ductal dilation. Pancreas: Unremarkable Spleen: Unremarkable Adrenals/Urinary Tract: Adrenal glands are unremarkable. Simple cortical cysts are seen within the right kidney for which no follow-up imaging is recommended. The kidneys are otherwise unremarkable. Bladder unremarkable. Stomach/Bowel: Moderate descending and sigmoid colonic diverticulosis. Stomach, small bowel, and large bowel are otherwise unremarkable. Appendix absent. No evidence of obstruction or focal inflammation. No free intraperitoneal gas or fluid. Vascular/Lymphatic: Aortic atherosclerosis. No enlarged abdominal or pelvic lymph nodes. Reproductive: Status post hysterectomy. No adnexal masses. Other: No abdominal wall hernia Musculoskeletal: Chronic anterior wedge compression deformity of L1 is seen with approximately 50% loss of height anteriorly, stable from PET CT of 03/01/2023. No retropulsion. Multiple sclerotic lesions have developed within the a spine at T11, L5, and S2 which are stable since PET CT examination of 03/01/2023 and are compatible with treated metastatic disease. IMPRESSION: 1. Acute cholecystitis. 2. Moderate hiatal hernia. Fluid-filled distal esophagus suggesting changes of gastroesophageal  reflux. 3. Moderate distal colonic diverticulosis without superimposed acute inflammatory change. 4. Stable sclerotic lesions within the spine at T11, L5, and S2 compatible with treated metastatic disease. Aortic Atherosclerosis (ICD10-I70.0). Electronically Signed   By: Helyn Numbers M.D.   On: 05/25/2023 23:00    Procedures Procedures    Medications Ordered in ED Medications  piperacillin-tazobactam (ZOSYN) IVPB 3.375 g (3.375 g Intravenous New Bag/Given 05/25/23 2320)  lactated ringers bolus 1,000 mL (has no administration in time range)  lactated ringers bolus 1,000 mL (1,000 mLs Intravenous New Bag/Given 05/25/23 2218)  morphine (PF) 4 MG/ML injection 4 mg (4 mg Intravenous Given 05/25/23 2214)  ondansetron (ZOFRAN) injection 4 mg (4 mg Intravenous Given 05/25/23 2214)  iohexol (OMNIPAQUE) 300 MG/ML solution 80 mL (80 mLs Intravenous Contrast Given 05/25/23 2229)  ondansetron (ZOFRAN) injection 4 mg (4 mg Intravenous Given 05/25/23 2315)  HYDROmorphone (DILAUDID) injection 0.5 mg (0.5 mg Intravenous Given 05/25/23 2316)    ED Course/ Medical Decision Making/ A&P                                 Medical Decision Making Problems Addressed: Acute cholecystitis: acute illness or injury with systemic symptoms that poses a threat to life or bodily functions Continuous severe abdominal pain: acute illness or injury with systemic symptoms that poses a threat to life or bodily functions History of  lung cancer: chronic illness or injury with exacerbation, progression, or side effects of treatment that poses a threat to life or bodily functions Right sided abdominal pain: acute illness or injury with systemic symptoms  Amount and/or Complexity of Data Reviewed Independent Historian: spouse    Details: hx External Data Reviewed: notes. Labs: ordered. Decision-making details documented in ED Course. Radiology: ordered and independent interpretation performed. Decision-making details documented in ED  Course. Discussion of management or test interpretation with external provider(s): General surgery, medicine  Risk Prescription drug management. Parenteral controlled substances. Decision regarding hospitalization.   Iv ns. Continuous pulse ox and cardiac monitoring. Labs ordered/sent. Imaging ordered.   Differential diagnosis includes cholecystitis, biliary colic, ureteral stone, appendicitis, diverticulitis, etc. Dispo decision including potential need for admission considered - will get labs and imaging and reassess.   Reviewed nursing notes and prior charts for additional history. External reports reviewed.   Morphine iv. Zofran iv. LR bolus.   Cardiac monitor: sinus rhythm, rate 90.  Labs reviewed/interpreted by me - wbc 9.6, hct 34.  Lytes unremarkable. Lfts and lipase pending (chem machine at [facility] down, labs couriered to [facility]).   CT reviewed/interpreted by me - c/w acute cholecystitis which is c/w exam, constant/persistent right abd pain, ruq tenderness. Zosyn iv.   Pain persists. Dilaudid iv. Zofran iv. LR bolus.   General surgery consulted. Discussed pt with Dr Robyne Peers - she will see in consult, request medicine admission, iv abx. Npo.  Request gb u/s in AM.   Medicine consulted for admission. Discussed pt - will admit.   Recheck pain improved from prior.   CRITICAL CARE RE: severe abdominal pain, acute cholecystiti/gb infection Performed by: Suzi Roots Total critical care time: 40 minutes Critical care time was exclusive of separately billable procedures and treating other patients. Critical care was necessary to treat or prevent imminent or life-threatening deterioration. Critical care was time spent personally by me on the following activities: development of treatment plan with patient and/or surrogate as well as nursing, discussions with consultants, evaluation of patient's response to treatment, examination of patient, obtaining history from patient or surrogate,  ordering and performing treatments and interventions, ordering and review of laboratory studies, ordering and review of radiographic studies, pulse oximetry and re-evaluation of patient's condition.         Final Clinical Impression(s) / ED Diagnoses Final diagnoses:  Acute cholecystitis  Right sided abdominal pain  Continuous severe abdominal pain  History of lung cancer    Rx / DC Orders ED Discharge Orders     None           Cathren Laine, MD 05/25/23 2335

## 2023-05-26 ENCOUNTER — Inpatient Hospital Stay (HOSPITAL_COMMUNITY)

## 2023-05-26 ENCOUNTER — Encounter (HOSPITAL_COMMUNITY): Payer: Self-pay | Admitting: Internal Medicine

## 2023-05-26 DIAGNOSIS — I4719 Other supraventricular tachycardia: Secondary | ICD-10-CM | POA: Insufficient documentation

## 2023-05-26 DIAGNOSIS — J9601 Acute respiratory failure with hypoxia: Secondary | ICD-10-CM | POA: Insufficient documentation

## 2023-05-26 DIAGNOSIS — R109 Unspecified abdominal pain: Secondary | ICD-10-CM | POA: Insufficient documentation

## 2023-05-26 DIAGNOSIS — J441 Chronic obstructive pulmonary disease with (acute) exacerbation: Secondary | ICD-10-CM | POA: Diagnosis not present

## 2023-05-26 DIAGNOSIS — C3492 Malignant neoplasm of unspecified part of left bronchus or lung: Secondary | ICD-10-CM

## 2023-05-26 DIAGNOSIS — E782 Mixed hyperlipidemia: Secondary | ICD-10-CM | POA: Insufficient documentation

## 2023-05-26 DIAGNOSIS — K810 Acute cholecystitis: Secondary | ICD-10-CM | POA: Diagnosis not present

## 2023-05-26 LAB — CBC
HCT: 31 % — ABNORMAL LOW (ref 36.0–46.0)
Hemoglobin: 9.5 g/dL — ABNORMAL LOW (ref 12.0–15.0)
MCH: 29.6 pg (ref 26.0–34.0)
MCHC: 30.6 g/dL (ref 30.0–36.0)
MCV: 96.6 fL (ref 80.0–100.0)
Platelets: 153 10*3/uL (ref 150–400)
RBC: 3.21 MIL/uL — ABNORMAL LOW (ref 3.87–5.11)
RDW: 13.3 % (ref 11.5–15.5)
WBC: 11.1 10*3/uL — ABNORMAL HIGH (ref 4.0–10.5)
nRBC: 0 % (ref 0.0–0.2)

## 2023-05-26 LAB — URINALYSIS, ROUTINE W REFLEX MICROSCOPIC
Bilirubin Urine: NEGATIVE
Glucose, UA: NEGATIVE mg/dL
Hgb urine dipstick: NEGATIVE
Ketones, ur: NEGATIVE mg/dL
Leukocytes,Ua: NEGATIVE
Nitrite: NEGATIVE
Protein, ur: NEGATIVE mg/dL
Specific Gravity, Urine: 1.039 — ABNORMAL HIGH (ref 1.005–1.030)
pH: 5 (ref 5.0–8.0)

## 2023-05-26 LAB — BASIC METABOLIC PANEL WITH GFR
Anion gap: 10 (ref 5–15)
BUN: 13 mg/dL (ref 8–23)
CO2: 23 mmol/L (ref 22–32)
Calcium: 8.6 mg/dL — ABNORMAL LOW (ref 8.9–10.3)
Chloride: 101 mmol/L (ref 98–111)
Creatinine, Ser: 1.04 mg/dL — ABNORMAL HIGH (ref 0.44–1.00)
GFR, Estimated: 57 mL/min — ABNORMAL LOW (ref >60)
Glucose, Bld: 202 mg/dL — ABNORMAL HIGH (ref 70–99)
Potassium: 3.6 mmol/L (ref 3.5–5.1)
Sodium: 134 mmol/L — ABNORMAL LOW (ref 135–145)

## 2023-05-26 LAB — MAGNESIUM
Magnesium: 1.7 mg/dL (ref 1.7–2.4)
Magnesium: 2 mg/dL (ref 1.7–2.4)

## 2023-05-26 LAB — PHOSPHORUS: Phosphorus: 3.7 mg/dL (ref 2.5–4.6)

## 2023-05-26 LAB — COMPREHENSIVE METABOLIC PANEL WITH GFR
ALT: 38 U/L (ref 0–44)
AST: 35 U/L (ref 15–41)
Albumin: 3.6 g/dL (ref 3.5–5.0)
Alkaline Phosphatase: 124 U/L (ref 38–126)
Anion gap: 10 (ref 5–15)
BUN: 11 mg/dL (ref 8–23)
CO2: 24 mmol/L (ref 22–32)
Calcium: 9 mg/dL (ref 8.9–10.3)
Chloride: 101 mmol/L (ref 98–111)
Creatinine, Ser: 0.91 mg/dL (ref 0.44–1.00)
GFR, Estimated: >60 mL/min (ref >60)
Glucose, Bld: 134 mg/dL — ABNORMAL HIGH (ref 70–99)
Potassium: 4 mmol/L (ref 3.5–5.1)
Sodium: 135 mmol/L (ref 135–145)
Total Bilirubin: 0.6 mg/dL (ref 0.0–1.2)
Total Protein: 6.3 g/dL — ABNORMAL LOW (ref 6.5–8.1)

## 2023-05-26 LAB — RESP PANEL BY RT-PCR (RSV, FLU A&B, COVID)  RVPGX2
Influenza A by PCR: NEGATIVE
Influenza B by PCR: NEGATIVE
Resp Syncytial Virus by PCR: NEGATIVE
SARS Coronavirus 2 by RT PCR: NEGATIVE

## 2023-05-26 LAB — LACTIC ACID, PLASMA: Lactic Acid, Venous: 1.7 mmol/L (ref 0.5–1.9)

## 2023-05-26 LAB — MRSA NEXT GEN BY PCR, NASAL: MRSA by PCR Next Gen: NOT DETECTED

## 2023-05-26 MED ORDER — MIDODRINE HCL 5 MG PO TABS
5.0000 mg | ORAL_TABLET | Freq: Three times a day (TID) | ORAL | Status: DC
  Administered 2023-05-26 – 2023-05-27 (×4): 5 mg via ORAL
  Filled 2023-05-26 (×4): qty 1

## 2023-05-26 MED ORDER — PANTOPRAZOLE SODIUM 40 MG PO TBEC: 40.0000 mg | DELAYED_RELEASE_TABLET | Freq: Every day | ORAL | Status: DC

## 2023-05-26 MED ORDER — IPRATROPIUM-ALBUTEROL 0.5-2.5 (3) MG/3ML IN SOLN
3.0000 mL | Freq: Four times a day (QID) | RESPIRATORY_TRACT | Status: DC
  Administered 2023-05-26 (×2): 3 mL via RESPIRATORY_TRACT
  Filled 2023-05-26 (×2): qty 3

## 2023-05-26 MED ORDER — METOPROLOL SUCCINATE ER 50 MG PO TB24
50.0000 mg | ORAL_TABLET | Freq: Every day | ORAL | Status: DC
  Filled 2023-05-26: qty 1

## 2023-05-26 MED ORDER — ONDANSETRON HCL 4 MG/2ML IJ SOLN
4.0000 mg | Freq: Four times a day (QID) | INTRAMUSCULAR | Status: DC | PRN
  Administered 2023-05-27: 4 mg via INTRAVENOUS
  Filled 2023-05-26 (×2): qty 2

## 2023-05-26 MED ORDER — HYDROMORPHONE HCL 1 MG/ML IJ SOLN
1.0000 mg | INTRAMUSCULAR | Status: DC | PRN
  Administered 2023-05-27 – 2023-05-29 (×6): 1 mg via INTRAVENOUS
  Filled 2023-05-26 (×6): qty 1

## 2023-05-26 MED ORDER — MAGNESIUM SULFATE 2 GM/50ML IV SOLN
2.0000 g | Freq: Once | INTRAVENOUS | Status: AC
  Administered 2023-05-26: 2 g via INTRAVENOUS
  Filled 2023-05-26: qty 50

## 2023-05-26 MED ORDER — METHYLPREDNISOLONE SODIUM SUCC 125 MG IJ SOLR
60.0000 mg | Freq: Two times a day (BID) | INTRAMUSCULAR | Status: DC
  Administered 2023-05-26 – 2023-05-28 (×6): 60 mg via INTRAVENOUS
  Filled 2023-05-26 (×6): qty 2

## 2023-05-26 MED ORDER — IPRATROPIUM-ALBUTEROL 0.5-2.5 (3) MG/3ML IN SOLN
3.0000 mL | Freq: Two times a day (BID) | RESPIRATORY_TRACT | Status: DC
  Administered 2023-05-26 – 2023-05-27 (×3): 3 mL via RESPIRATORY_TRACT
  Filled 2023-05-26 (×3): qty 3

## 2023-05-26 MED ORDER — DILTIAZEM HCL-DEXTROSE 125-5 MG/125ML-% IV SOLN (PREMIX)
5.0000 mg/h | INTRAVENOUS | Status: DC
Start: 2023-05-26 — End: 2023-05-28
  Administered 2023-05-26: 5 mg/h via INTRAVENOUS
  Filled 2023-05-26 (×2): qty 125

## 2023-05-26 MED ORDER — OSIMERTINIB MESYLATE 80 MG PO TABS
80.0000 mg | ORAL_TABLET | Freq: Every day | ORAL | Status: DC
  Administered 2023-05-27 – 2023-05-29 (×3): 80 mg via ORAL
  Filled 2023-05-26: qty 1

## 2023-05-26 MED ORDER — ALBUTEROL SULFATE (2.5 MG/3ML) 0.083% IN NEBU
3.0000 mL | INHALATION_SOLUTION | Freq: Four times a day (QID) | RESPIRATORY_TRACT | Status: DC | PRN
  Administered 2023-05-29: 3 mL via RESPIRATORY_TRACT
  Filled 2023-05-26: qty 3

## 2023-05-26 MED ORDER — OXYCODONE HCL 5 MG PO TABS
5.0000 mg | ORAL_TABLET | ORAL | Status: DC | PRN
  Administered 2023-05-28 – 2023-05-29 (×2): 5 mg via ORAL
  Filled 2023-05-26 (×2): qty 1

## 2023-05-26 MED ORDER — IPRATROPIUM-ALBUTEROL 0.5-2.5 (3) MG/3ML IN SOLN: 3.0000 mL | Freq: Two times a day (BID) | RESPIRATORY_TRACT | Status: DC

## 2023-05-26 MED ORDER — HYDROMORPHONE HCL 1 MG/ML IJ SOLN
0.5000 mg | INTRAMUSCULAR | Status: DC | PRN
  Administered 2023-05-26 (×2): 0.5 mg via INTRAVENOUS
  Filled 2023-05-26 (×2): qty 0.5

## 2023-05-26 MED ORDER — LACTATED RINGERS IV SOLN: INTRAVENOUS | Status: AC

## 2023-05-26 MED ORDER — PIPERACILLIN-TAZOBACTAM 3.375 G IVPB
3.3750 g | Freq: Three times a day (TID) | INTRAVENOUS | Status: DC
  Administered 2023-05-26 – 2023-05-30 (×13): 3.375 g via INTRAVENOUS
  Filled 2023-05-26 (×12): qty 50

## 2023-05-26 MED ORDER — DILTIAZEM LOAD VIA INFUSION
10.0000 mg | Freq: Once | INTRAVENOUS | Status: AC
  Administered 2023-05-26: 10 mg via INTRAVENOUS
  Filled 2023-05-26: qty 10

## 2023-05-26 MED ORDER — ROSUVASTATIN CALCIUM 10 MG PO TABS
5.0000 mg | ORAL_TABLET | Freq: Every day | ORAL | Status: DC
  Administered 2023-05-26 – 2023-05-29 (×4): 5 mg via ORAL
  Filled 2023-05-26 (×4): qty 1

## 2023-05-26 MED ORDER — SODIUM CHLORIDE 0.9 % IV SOLN
100.0000 mg | Freq: Two times a day (BID) | INTRAVENOUS | Status: DC
  Administered 2023-05-26 – 2023-05-29 (×6): 100 mg via INTRAVENOUS
  Filled 2023-05-26 (×7): qty 100

## 2023-05-26 MED ORDER — ARFORMOTEROL TARTRATE 15 MCG/2ML IN NEBU
15.0000 ug | INHALATION_SOLUTION | Freq: Two times a day (BID) | RESPIRATORY_TRACT | Status: DC
  Administered 2023-05-26 – 2023-05-30 (×9): 15 ug via RESPIRATORY_TRACT
  Filled 2023-05-26 (×9): qty 2

## 2023-05-26 MED ORDER — ACETAMINOPHEN 325 MG PO TABS: 650.0000 mg | ORAL_TABLET | Freq: Four times a day (QID) | ORAL | Status: DC | PRN

## 2023-05-26 MED ORDER — ACETAMINOPHEN 650 MG RE SUPP: 650.0000 mg | Freq: Four times a day (QID) | RECTAL | Status: DC | PRN

## 2023-05-26 MED ORDER — BENZONATATE 100 MG PO CAPS
100.0000 mg | ORAL_CAPSULE | Freq: Two times a day (BID) | ORAL | Status: DC
  Administered 2023-05-26 – 2023-05-30 (×9): 100 mg via ORAL
  Filled 2023-05-26 (×9): qty 1

## 2023-05-26 MED ORDER — LACTATED RINGERS IV BOLUS
1000.0000 mL | Freq: Once | INTRAVENOUS | Status: AC
  Administered 2023-05-26: 1000 mL via INTRAVENOUS

## 2023-05-26 MED ORDER — ACETAMINOPHEN 500 MG PO TABS
1000.0000 mg | ORAL_TABLET | Freq: Four times a day (QID) | ORAL | Status: DC
  Administered 2023-05-26 – 2023-05-30 (×12): 1000 mg via ORAL
  Filled 2023-05-26 (×13): qty 2

## 2023-05-26 MED ORDER — PANTOPRAZOLE SODIUM 40 MG IV SOLR
40.0000 mg | Freq: Every day | INTRAVENOUS | Status: DC
  Administered 2023-05-26 – 2023-05-27 (×2): 40 mg via INTRAVENOUS
  Filled 2023-05-26 (×2): qty 10

## 2023-05-26 MED ORDER — LEVOTHYROXINE SODIUM 100 MCG PO TABS
100.0000 ug | ORAL_TABLET | Freq: Every day | ORAL | Status: DC
  Administered 2023-05-26 – 2023-05-30 (×4): 100 ug via ORAL
  Filled 2023-05-26 (×4): qty 1

## 2023-05-26 MED ORDER — ONDANSETRON HCL 4 MG PO TABS: 4.0000 mg | ORAL_TABLET | Freq: Four times a day (QID) | ORAL | Status: DC | PRN

## 2023-05-26 MED ORDER — BUDESONIDE 0.5 MG/2ML IN SUSP
0.5000 mg | Freq: Two times a day (BID) | RESPIRATORY_TRACT | Status: DC
  Administered 2023-05-26 – 2023-05-30 (×9): 0.5 mg via RESPIRATORY_TRACT
  Filled 2023-05-26 (×9): qty 2

## 2023-05-26 MED ORDER — METOPROLOL TARTRATE 50 MG PO TABS: 100.0000 mg | ORAL_TABLET | ORAL | Status: DC

## 2023-05-26 NOTE — TOC Initial Note (Signed)
 Transition of Care [facility]) - Initial/Assessment Note    Patient Details  Name: Linda Woods MRN: [id number] Date of Birth: 1950-05-28  Transition of Care [facility]) CM/SW Contact:    Anabel Halon, RN Phone Number: 05/26/2023, 5:06 PM  Clinical Narrative:    Patient lives with her spouse in a single family home. Patient has DME rolling walker, and cane. Patient reported she just stopped using and she was released to drive because she was no longer on her pain medication. Patient reported she was in the hospital last month. Prior the the last hospital she need help with adl's. Patient states she just started doing her personal care with out help. Patient reported she plan to be discharge home.   TOC will continue to monitor patient advancement through interdisciplinary progression rounds.            Expected Discharge Plan: Home/Self Care Barriers to Discharge: No Barriers Identified   Patient Goals and CMS Choice  Patient plans to be discharge home.          Expected Discharge Plan and Services       Living arrangements for the past 2 months: Single Family Home                                      Prior Living Arrangements/Services Living arrangements for the past 2 months: Single Family Home Lives with:: Spouse                   Activities of Daily Living   ADL Screening (condition at time of admission) Independently performs ADLs?: Yes (appropriate for developmental age) Does the patient have a NEW difficulty with bathing/dressing/toileting/self-feeding that is expected to last >3 days?: No Does the patient have a NEW difficulty with getting in/out of bed, walking, or climbing stairs that is expected to last >3 days?: No Does the patient have a NEW difficulty with communication that is expected to last >3 days?: No Is the patient deaf or have difficulty hearing?: No Does the patient have difficulty seeing, even when wearing glasses/contacts?: No Does  the patient have difficulty concentrating, remembering, or making decisions?: No  Permission Sought/Granted Permission sought to share information with : Case Manager                Emotional Assessment Appearance:: Appears stated age Attitude/Demeanor/Rapport: Self-Confident, Engaged Affect (typically observed): Stable Orientation: : Oriented to Self, Oriented to Place, Oriented to  Time, Oriented to Situation      Admission diagnosis:  Acute cholecystitis [K81.0] History of lung cancer [Z85.118] Right sided abdominal pain [R10.9] Continuous severe abdominal pain [R10.9] Patient Active Problem List   Diagnosis Date Noted   Abdominal pain 05/26/2023   Mixed hyperlipidemia 05/26/2023   Acute respiratory failure with hypoxia (HCC) 05/26/2023   COPD with acute exacerbation (HCC) 05/26/2023   Multifocal atrial tachycardia (HCC) 05/26/2023   Acute cholecystitis 05/25/2023   S/P total knee arthroplasty, right 04/16/2023   Allergic rhinitis 10/19/2020   Asthma, chronic 10/19/2020   Cough 08/09/2020   Drug-induced diarrhea 02/22/2020   Adenocarcinoma of left lung, stage 3 (HCC) 11/03/2019   Encounter for antineoplastic chemotherapy 11/03/2019   Goals of care, counseling/discussion 11/03/2019   Pneumothorax, left 10/20/2019   Mass of left lung 10/16/2019   History of adenomatous polyp of colon 09/29/2018   Diverticulosis 09/29/2018   Internal hemorrhoids 09/29/2018   S/P left  TKA 09/09/2018   Status post total left knee replacement 09/09/2018   Pain of left heel 07/03/2017   Pain in joint of right hip 05/17/2017   Benign essential hypertension 11/17/2015   Abnormal weight gain 11/17/2015   Obesity, Class I, BMI 30-34.9 11/17/2015   Gastroesophageal reflux disease without esophagitis 10/12/2014   Hypertriglyceridemia 10/12/2014   Essential hypertension 05/21/2013   Acquired hypothyroidism 05/21/2013   Vitamin D deficiency 05/21/2013   PCP:  Assunta Found, MD Pharmacy:    [facility] [place], [place] - [address] [place] [address] Phone: [phone] Fax: [phone]  [place] [facility] [address] [place] [address] Phone: [phone] Fax: [phone]     Social Drivers of Health (SDOH) Social History: SDOH Screenings   Food Insecurity: No Food Insecurity (05/26/2023)  Housing: Low Risk  (05/26/2023)  Transportation Needs: No Transportation Needs (05/26/2023)  Utilities: Not At Risk (05/26/2023)  Depression (PHQ2-9): Low Risk  (11/01/2022)  Social Connections: Unknown (05/26/2023)  Tobacco Use: Low Risk  (05/25/2023)   SDOH Interventions:     Readmission Risk Interventions     No data to display

## 2023-05-26 NOTE — Progress Notes (Signed)
 NT called this nurse in to the room. Pt needed to use the bathroom but when NT arrived to the room she was SOB. Nurse and NT checked o2 and it was 79% on RA at first ad went up to 84%. 2L applied to pt and o2 sat went up to 92% with a HR of 148 bpm. Pt was assisted to the [facility] and back to bed. After 10 minutes of resting pts HR was still in the high 140s-150s. This nurse listened and it was 151 bpm (apical) EKG obtained and showed sinus tach and notified Dr. Arbutus Leas.    Temp 97.4 BP 102/64 map 77 HR 141 R 21

## 2023-05-26 NOTE — Progress Notes (Signed)
   05/26/23 1647  TOC Assessment  TOC screening is complete Yes  DTP Eligible Y  Once discharged, how will the patient get to their discharge location? Family/Friend - [facility]  Expected Discharge Plan Home/Self Care  Barriers to Discharge No Barriers Identified  Living arrangements for the past 2 months Single Family Home  Lives with: Spouse  Permission sought to share information with  Case Manager  Appearance: Appears stated age  Attitude/Demeanor/Rapport Engaged  Affect (typically observed) Stable  Orientation:  Oriented to Self;Oriented to Place;Oriented to  Time;Oriented to Situation   CM met with patient at bedside. Patient admitted Acute cholecystitis.

## 2023-05-26 NOTE — Hospital Course (Addendum)
 Linda Woods is a  72 year old female with a history of non-small cell lung cancer, hypertension, hyperlipidemia, hypothyroidism, obstructive lung disease/asthma presenting with right-sided abdominal pain that began on the morning of 05/25/2023.  She had complained of nausea but no emesis.  There is no diarrhea, hematochezia, melena.  She denied any chest pain or shortness of breath at the time of admission.  In the ED, the patient was afebrile and hemodynamically stable.  Oxygen saturation was 92% on room air.  WBC 9.6, hemoglobin 10.5, platelets 165.  Sodium 136, potassium 3.9, bicarbonate 23, serum creatinine 1.11.  AST 40, ALT 44, alk phosphatase 129, total bilirubin 0.6.  Lipase 62.   CT of the abdomen and pelvis showed gallbladder wall thickening with pericholecystic edema and distended gallbladder.  Patient was started IV Zosyn.  General surgery was consulted.  The patient was admitted for evaluation of acute cholecystitis  On the morning of 05/26/2023, the patient was short of breath and developed tachycardia with heart rate in the 140s.  She was moved to the stepdown unit for COPD exacerbation and atrial tachycardia.  She was started on bronchodilators, steroids, and diltiazem.

## 2023-05-26 NOTE — Progress Notes (Signed)
 Pt admitted to Rm 312 via stretcher and was moved to bed. A & O x 4. VS within normal limits. C/o pain in abdomen 8/10, MD notified,  PRN pain ordered and administered by nurse.

## 2023-05-26 NOTE — Progress Notes (Addendum)
 PROGRESS NOTE  Linda Woods WUJ:[id number] DOB: [birth year] DOA: 05/25/2023 PCP: Assunta Found, MD  Brief History:  73 year old female with a history of non-small cell lung cancer, hypertension, hyperlipidemia, hypothyroidism, obstructive lung disease/asthma presenting with right-sided abdominal pain that began on the morning of 05/25/2023.  She had complained of nausea but no emesis.  There is no diarrhea, hematochezia, melena.  She denied any chest pain or shortness of breath at the time of admission.  In the ED, the patient was afebrile and hemodynamically stable.  Oxygen saturation was 92% on room air.  WBC 9.6, hemoglobin 10.5, platelets 165.  Sodium 136, potassium 3.9, bicarbonate 23, serum creatinine 1.11.  AST 40, ALT 44, alk phosphatase 129, total bilirubin 0.6.  Lipase 62.   CT of the abdomen and pelvis showed gallbladder wall thickening with pericholecystic edema and distended gallbladder.  Patient was started IV Zosyn.  General surgery was consulted.  The patient was admitted for evaluation of acute cholecystitis  On the morning of 05/26/2023, the patient was short of breath and developed tachycardia with heart rate in the 140s.  She was moved to the stepdown unit for COPD exacerbation and atrial tachycardia.  She was started on bronchodilators, steroids, and diltiazem.   Assessment/Plan: Acute cholecystitis -General Surgery consulted -Continue Zosyn -Start IV fluids -CT AP as discussed above  Acute respiratory failure with hypoxia -secondary to COPD exacerbation -Start Brovana -Start Pulmicort -Start DuoNebs -Start IV steroids -check COVID -check viral resp panel -Personally reviewed chest x-ray--no acute infiltrates or edema  COPD exacerbation -Treatment as above  Atrial tachycardia/MAT -Personally reviewed EKG 4/6>> atrial tachycardia, nonspecific STT wave changes -Start diltiazem drip -Echo  Essential hypertension -Started diltiazem drip as discussed  above  Hypothyroidism -Continue Synthroid -Check TSH  NSCLC -Initially diagnosed 09/2019 -s/p 7 cycles paclitaxel and carboplatin -Continue Tagrisso -Follow-up Dr. Shirline Frees  Mixed hyperlipidemia -Continue statin  Status post right TKA -Patient had surgery 04/16/2023 -PT evaluation when stable         Family Communication:   Family at bedside  Consultants:  general surgery  Code Status:  FULL   DVT Prophylaxis:  SCDs   Procedures: As Listed in Progress Note Above  Antibiotics: Zosyn 4/5>> Doxy 4/6>>      The patient is critically ill with multiple organ systems failure and requires high complexity decision making for assessment and support, frequent evaluation and titration of therapies, application of advanced monitoring technologies and extensive interpretation of multiple databases.  Critical care time - 45 mins.    Subjective:  Patient complains of breath this morning.  She continues to have abdominal pain although it is controlled with opioids.  She has some nausea without any emesis.  There is no diarrhea, hematochezia, melena Objective: Vitals:   05/26/23 0344 05/26/23 0820 05/26/23 0843 05/26/23 0930  BP: 126/67 105/82  103/69  Pulse: 100 (!) 152  (!) 137  Resp: 18 (!) 23    Temp: 98.7 F (37.1 C) (!) 97.4 F (36.3 C)  97.8 F (36.6 C)  TempSrc: Oral Oral  Oral  SpO2: 92% (!) 84% 93% 93%  Weight:      Height:       No intake or output data in the 24 hours ending 05/26/23 0946 Weight change:  Exam:  General:  Pt is alert, follows commands appropriately, not in acute distress HEENT: No icterus, No thrush, No neck mass, Gray/AT Cardiovascular: RRR, S1/S2, no rubs, no gallops Respiratory:  Bilateral rales.  Bilateral expiratory wheeze. Abdomen: Soft/+BS, non tender, non distended, no guarding Extremities: No edema, No lymphangitis, No petechiae, No rashes, no synovitis   Data Reviewed: I have personally reviewed following labs and imaging  studies Basic Metabolic Panel: Recent Labs  Lab 05/25/23 2204 05/25/23 2209 05/26/23 0441  NA 136 137 135  K 3.9 3.9 4.0  CL 101 103 101  CO2 23  --  24  GLUCOSE 149* 149* 134*  BUN 12 10 11   CREATININE 1.11* 1.20* 0.91  CALCIUM 9.5  --  9.0  MG  --   --  1.7  PHOS  --   --  3.7   Liver Function Tests: Recent Labs  Lab 05/25/23 2204 05/26/23 0441  AST 48* 35  ALT 44 38  ALKPHOS 129* 124  BILITOT 0.6 0.6  PROT 6.9 6.3*  ALBUMIN 3.9 3.6   Recent Labs  Lab 05/25/23 2204  LIPASE 62*   No results for input(s): "AMMONIA" in the last 168 hours. Coagulation Profile: No results for input(s): "INR", "PROTIME" in the last 168 hours. CBC: Recent Labs  Lab 05/25/23 2204 05/25/23 2209 05/26/23 0441  WBC 9.6  --  11.1*  HGB 10.5* 11.2* 9.5*  HCT 33.6* 33.0* 31.0*  MCV 94.6  --  96.6  PLT 165  --  153   Cardiac Enzymes: No results for input(s): "CKTOTAL", "CKMB", "CKMBINDEX", "TROPONINI" in the last 168 hours. BNP: Invalid input(s): "POCBNP" CBG: No results for input(s): "GLUCAP" in the last 168 hours. HbA1C: No results for input(s): "HGBA1C" in the last 72 hours. Urine analysis:    Component Value Date/Time   COLORURINE YELLOW 05/25/2023 2140   APPEARANCEUR CLEAR 05/25/2023 2140   LABSPEC 1.039 (H) 05/25/2023 2140   PHURINE 5.0 05/25/2023 2140   GLUCOSEU NEGATIVE 05/25/2023 2140   HGBUR NEGATIVE 05/25/2023 2140   BILIRUBINUR NEGATIVE 05/25/2023 2140   BILIRUBINUR n 10/12/2014 1040   KETONESUR NEGATIVE 05/25/2023 2140   PROTEINUR NEGATIVE 05/25/2023 2140   UROBILINOGEN negative 10/12/2014 1040   UROBILINOGEN 0.2 10/14/2009 0900   NITRITE NEGATIVE 05/25/2023 2140   LEUKOCYTESUR NEGATIVE 05/25/2023 2140   Sepsis Labs: @LABRCNTIP (procalcitonin:4,lacticidven:4) )No results found for this or any previous visit (from the past 240 hours).   Scheduled Meds:  acetaminophen  1,000 mg Oral Q6H   arformoterol  15 mcg Nebulization BID   budesonide (PULMICORT)  nebulizer solution  0.5 mg Nebulization BID   diltiazem  10 mg Intravenous Once   ipratropium-albuterol  3 mL Nebulization Q6H   levothyroxine  100 mcg Oral Daily   methylPREDNISolone (SOLU-MEDROL) injection  60 mg Intravenous Q12H   metoprolol succinate  50 mg Oral Daily   osimertinib mesylate  80 mg Oral Daily   pantoprazole  40 mg Oral Daily   rosuvastatin  5 mg Oral Daily   Continuous Infusions:  diltiazem (CARDIZEM) infusion     lactated ringers 80 mL/hr at 05/26/23 0344   piperacillin-tazobactam (ZOSYN)  IV 3.375 g (05/26/23 0605)    Procedures/Studies: CT ABDOMEN PELVIS W CONTRAST Result Date: 05/25/2023 CLINICAL DATA:  Acute nonlocalized abdominal pain, right upper quadrant abdominal pain EXAM: CT ABDOMEN AND PELVIS WITH CONTRAST TECHNIQUE: Multidetector CT imaging of the abdomen and pelvis was performed using the standard protocol following bolus administration of intravenous contrast. RADIATION DOSE REDUCTION: This exam was performed according to the departmental dose-optimization program which includes automated exposure control, adjustment of the mA and/or kV according to patient size and/or use of iterative reconstruction technique. CONTRAST:  80mL OMNIPAQUE IOHEXOL 300 MG/ML  SOLN COMPARISON:  10/14/2019 FINDINGS: Lower chest: No acute abnormality. Moderate hiatal hernia. The distal esophagus is fluid-filled suggesting changes of gastroesophageal reflux. 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 Hepatobiliary: Mild perihepatic ascites. The gallbladder is distended, there is gallbladder wall thickening, pericholecystic edema within the gallbladder fossa, and extensive peri-cholecystic inflammatory stranding  in keeping with changes of acute cholecystitis. Liver unremarkable. No intra or extrahepatic biliary ductal dilation. Pancreas: Unremarkable Spleen: Unremarkable Adrenals/Urinary Tract: Adrenal glands are unremarkable. Simple cortical cysts are seen within the right kidney for which no follow-up imaging is recommended. The kidneys are otherwise unremarkable. Bladder unremarkable. Stomach/Bowel: Moderate descending and sigmoid colonic diverticulosis. Stomach, small bowel, and large bowel are otherwise unremarkable. Appendix absent. No evidence of obstruction or focal inflammation. No free intraperitoneal gas or fluid. Vascular/Lymphatic: Aortic atherosclerosis. No enlarged abdominal or pelvic lymph nodes. Reproductive: Status post hysterectomy. No adnexal masses. Other: No abdominal wall hernia Musculoskeletal: Chronic anterior wedge compression deformity of L1 is seen with approximately 50% loss of height anteriorly, stable from PET CT of 03/01/2023. No retropulsion. Multiple sclerotic lesions have developed within the a spine at T11, L5, and S2 which are stable since PET CT examination of 03/01/2023 and are compatible with treated metastatic disease. IMPRESSION: 1. Acute cholecystitis. 2. Moderate hiatal hernia. Fluid-filled distal esophagus suggesting changes of gastroesophageal reflux. 3. Moderate distal colonic diverticulosis without superimposed acute inflammatory change. 4. Stable sclerotic lesions within the spine at T11, L5, and S2 compatible with treated metastatic disease. Aortic Atherosclerosis (ICD10-I70.0). Electronically Signed   By: Helyn Numbers M.D.   On: 05/25/2023 23:00    Catarina Hartshorn, DO  [facility]  If 7PM-7AM, please contact night-coverage [email or web address] Password [facility] 05/26/2023, 9:46 AM   LOS: 1 day  ,

## 2023-05-26 NOTE — Consult Note (Signed)
 [facility] Consult  Reason for Consult: Acute cholecystitis Referring Physician: Dr. Denton Lank   Chief Complaint   Abdominal Pain     HPI: Linda Woods is a 73 y.o. female who presented to the hospital with a 1 week history of abdominal and right back pain.  She states that she has been having this pain for couple of months, but it progressively worsened over the last couple of days which prompted her coming to the emergency department last night.  She confirms having nausea with emesis this past week.  Per her husband, he believes that this was brought on by eating.  Patient states that she has had problems like this in the past, though has never had to be evaluated for the pain.  Her past medical history is significant for non-small cell lung cancer on [place], COPD/asthma, hypertension, hyperlipidemia, and hypothyroidism.  She denies use of blood thinning medications.  She denies ever having any abdominal surgeries, though appendectomy and ovarian cyst removal and rectocele repair are all listed on her EMR.  In the ED, she was noted to be hemodynamically stable.  She had no leukocytosis and normal LFTs.  She underwent a CT of the abdomen and pelvis which demonstrated findings concerning for acute cholecystitis with pericholecystic fluid and wall thickening.  This morning, patient was having increased shortness of breath and was noted to be tachycardic into the 140s.  Per her nurse, her oxygen saturation was 79 upon checking pulse ox this morning.  She was treated for a COPD exacerbation by the hospitalist.  Her abdominal pain is well-controlled at this time.  She denies nausea and vomiting currently.  Past Medical History:  Diagnosis Date   Anemia    as a child   Anxiety    Asthma 10/19/2020   Bursitis    Chronic reflux esophagitis    Complication of anesthesia    Diarrhea, functional    Diverticulosis    GERD (gastroesophageal reflux disease)    History of  kidney stones    Hypertension    Knee pain    right knee-seeing ortho   lung ca 09/2019   Osteoarthritis    Plantar fasciitis    Pneumonia    PONV (postoperative nausea and vomiting)    has used the patch before and that helps   Pure hypercholesterolemia    RLS (restless legs syndrome)    Superficial vein thrombosis    Thyroid disease    hypothyroid    Past Surgical History:  Procedure Laterality Date   APPENDECTOMY     BRONCHIAL BIOPSY  10/20/2019   Procedure: BRONCHIAL BIOPSIES;  Surgeon: Leslye Peer, MD;  Location: [facility] ENDOSCOPY;  Service: Pulmonary;;   BRONCHIAL BRUSHINGS  10/20/2019   Procedure: BRONCHIAL BRUSHINGS;  Surgeon: Leslye Peer, MD;  Location: [facility] ENDOSCOPY;  Service: Pulmonary;;   BRONCHIAL NEEDLE ASPIRATION BIOPSY  10/20/2019   Procedure: BRONCHIAL NEEDLE ASPIRATION BIOPSIES;  Surgeon: Leslye Peer, MD;  Location: [facility] ENDOSCOPY;  Service: Pulmonary;;   BRONCHIAL WASHINGS  10/20/2019   Procedure: BRONCHIAL WASHINGS;  Surgeon: Leslye Peer, MD;  Location: [facility] ENDOSCOPY;  Service: Pulmonary;;   CARPAL TUNNEL RELEASE Right 2011   Dr. Amanda Pea   COLPORRHAPHY     posterior   HAND SURGERY Right 2016   Nerve surgery, Dr. Amanda Pea   OVARIAN CYST REMOVAL     RECTOCELE REPAIR  2011   w/TVH and sling   TONSILLECTOMY     TONSILLECTOMY  TOTAL KNEE ARTHROPLASTY Left 09/09/2018   Procedure: TOTAL KNEE ARTHROPLASTY, CORTISONE INJECTION RIGHT KNEE;  Surgeon: Durene Romans, MD;  Location: [facility] ORS;  Service: Orthopedics;  Laterality: Left;  70 mins   TOTAL KNEE ARTHROPLASTY Right 04/16/2023   Procedure: TOTAL KNEE ARTHROPLASTY;  Surgeon: Durene Romans, MD;  Location: [facility] ORS;  Service: Orthopedics;  Laterality: Right;   TOTAL VAGINAL HYSTERECTOMY  10/18/2009   rectocele repair, sling   TUBAL LIGATION Bilateral    VARICOSE VEIN SURGERY     VIDEO BRONCHOSCOPY WITH ENDOBRONCHIAL NAVIGATION N/A 10/20/2019   Procedure: VIDEO BRONCHOSCOPY WITH ENDOBRONCHIAL NAVIGATION;  Surgeon:  Leslye Peer, MD;  Location: [facility] ENDOSCOPY;  Service: Pulmonary;  Laterality: N/A;    Family History  Problem Relation Age of Onset   Cervical cancer Mother    Heart failure Mother    Heart failure Father    Diabetes Father    Cancer Brother    Breast cancer Maternal Aunt    Breast cancer Paternal Aunt     Social History   Tobacco Use   Smoking status: Never   Smokeless tobacco: Never  Vaping Use   Vaping status: Never Used  Substance Use Topics   Alcohol use: Yes    Alcohol/week: 1.0 standard drink of alcohol    Types: 1 Glasses of wine per week   Drug use: No    Medications: I have reviewed the patient's current medications.  No Known Allergies   ROS:  Pertinent items are noted in HPI.  Blood pressure (!) 73/53, pulse (!) 118, temperature 97.8 F (36.6 C), temperature source Oral, resp. rate 18, height 5\' 4"  (1.626 m), weight 66.7 kg, last menstrual period 08/21/2002, SpO2 99%. Physical Exam Vitals reviewed.  Constitutional:      Appearance: She is well-developed.  HENT:     Head: Normocephalic and atraumatic.  Cardiovascular:     Rate and Rhythm: Tachycardia present.  Pulmonary:     Comments: Increased work of breathing Abdominal:     Comments: Abdomen soft, nondistended, no percussion tenderness, minimal right upper quadrant tenderness to palpation; no rigidity, guarding, rebound tenderness; negative Murphy sign  Skin:    General: Skin is warm and dry.  Neurological:     General: No focal deficit present.     Mental Status: She is alert and oriented to person, place, and time.  Psychiatric:        Mood and Affect: Mood normal.        Behavior: Behavior normal.     Results: Results for orders placed or performed during the hospital encounter of 05/25/23 (from the past 48 hours)  Urinalysis, Routine w reflex microscopic -Urine, Clean Catch     Status: Abnormal   Collection Time: 05/25/23  9:40 PM  Result Value Ref Range   Color, Urine YELLOW  YELLOW   APPearance CLEAR CLEAR   Specific Gravity, Urine 1.039 (H) 1.005 - 1.030   pH 5.0 5.0 - 8.0   Glucose, UA NEGATIVE NEGATIVE mg/dL   Hgb urine dipstick NEGATIVE NEGATIVE   Bilirubin Urine NEGATIVE NEGATIVE   Ketones, ur NEGATIVE NEGATIVE mg/dL   Protein, ur NEGATIVE NEGATIVE mg/dL   Nitrite NEGATIVE NEGATIVE   Leukocytes,Ua NEGATIVE NEGATIVE    Comment: Performed at [facility], [address], [place], [place] [address]  Lipase, blood     Status: Abnormal   Collection Time: 05/25/23 10:04 PM  Result Value Ref Range   Lipase 62 (H) 11 - 51 U/L    Comment:  Performed at [facility], [address], [place], [place] [address]  Comprehensive metabolic panel     Status: Abnormal   Collection Time: 05/25/23 10:04 PM  Result Value Ref Range   Sodium 136 135 - 145 mmol/L   Potassium 3.9 3.5 - 5.1 mmol/L   Chloride 101 98 - 111 mmol/L   CO2 23 22 - 32 mmol/L   Glucose, Bld 149 (H) 70 - 99 mg/dL    Comment: Glucose reference range applies only to samples taken after fasting for at least 8 hours.   BUN 12 8 - 23 mg/dL   Creatinine, Ser 6.04 (H) 0.44 - 1.00 mg/dL   Calcium 9.5 8.9 - 54.0 mg/dL   Total Protein 6.9 6.5 - 8.1 g/dL   Albumin 3.9 3.5 - 5.0 g/dL   AST 48 (H) 15 - 41 U/L   ALT 44 0 - 44 U/L   Alkaline Phosphatase 129 (H) 38 - 126 U/L   Total Bilirubin 0.6 0.0 - 1.2 mg/dL   GFR, Estimated 53 (L) >60 mL/min    Comment: (NOTE) Calculated using the CKD-EPI Creatinine Equation (2021)    Anion gap 12 5 - 15    Comment: Performed at [facility], [address], [place], [place] [address]  CBC     Status: Abnormal   Collection Time: 05/25/23 10:04 PM  Result Value Ref Range   WBC 9.6 4.0 - 10.5 K/uL   RBC 3.55 (L) 3.87 - 5.11 MIL/uL   Hemoglobin 10.5 (L) 12.0 - 15.0 g/dL   HCT 14.7 (L) 82.9 - 56.2 %   MCV 94.6 80.0 - 100.0 fL   MCH 29.6 26.0 - 34.0 pg   MCHC 31.3 30.0 - 36.0 g/dL   RDW 13.0 86.5 - 78.4 %   Platelets 165 150 - 400 K/uL   nRBC 0.0 0.0 - 0.2 %     Comment: Performed at [facility], [address], [place], [place] [address]  I-stat chem 8, ED     Status: Abnormal   Collection Time: 05/25/23 10:09 PM  Result Value Ref Range   Sodium 137 135 - 145 mmol/L   Potassium 3.9 3.5 - 5.1 mmol/L   Chloride 103 98 - 111 mmol/L   BUN 10 8 - 23 mg/dL   Creatinine, Ser 5.28 (H) 0.44 - 1.00 mg/dL   Glucose, Bld 413 (H) 70 - 99 mg/dL    Comment: Glucose reference range applies only to samples taken after fasting for at least 8 hours.   Calcium, Ion 1.20 1.15 - 1.40 mmol/L   TCO2 23 22 - 32 mmol/L   Hemoglobin 11.2 (L) 12.0 - 15.0 g/dL   HCT 24.4 (L) 01.0 - 27.2 %  Culture, blood (Routine X 2) w Reflex to ID Panel     Status: None (Preliminary result)   Collection Time: 05/26/23  4:29 AM   Specimen: Left Antecubital; Blood  Result Value Ref Range   Specimen Description LEFT ANTECUBITAL    Special Requests BOTTLES DRAWN AEROBIC AND ANAEROBIC    Culture      NO GROWTH < 12 HOURS Performed at [facility], [address] [place], [place] [address]    Report Status PENDING   Comprehensive metabolic panel     Status: Abnormal   Collection Time: 05/26/23  4:41 AM  Result Value Ref Range   Sodium 135 135 - 145 mmol/L   Potassium 4.0 3.5 - 5.1 mmol/L   Chloride 101 98 - 111 mmol/L  CO2 24 22 - 32 mmol/L   Glucose, Bld 134 (H) 70 - 99 mg/dL    Comment: Glucose reference range applies only to samples taken after fasting for at least 8 hours.   BUN 11 8 - 23 mg/dL   Creatinine, Ser 1.61 0.44 - 1.00 mg/dL   Calcium 9.0 8.9 - 09.6 mg/dL   Total Protein 6.3 (L) 6.5 - 8.1 g/dL   Albumin 3.6 3.5 - 5.0 g/dL   AST 35 15 - 41 U/L   ALT 38 0 - 44 U/L   Alkaline Phosphatase 124 38 - 126 U/L   Total Bilirubin 0.6 0.0 - 1.2 mg/dL   GFR, Estimated >04 >54 mL/min    Comment: (NOTE) Calculated using the CKD-EPI Creatinine Equation (2021)    Anion gap 10 5 - 15    Comment: Performed at [facility], [address], [place], [place] [address]  CBC      Status: Abnormal   Collection Time: 05/26/23  4:41 AM  Result Value Ref Range   WBC 11.1 (H) 4.0 - 10.5 K/uL   RBC 3.21 (L) 3.87 - 5.11 MIL/uL   Hemoglobin 9.5 (L) 12.0 - 15.0 g/dL   HCT 91.4 (L) 78.2 - 95.6 %   MCV 96.6 80.0 - 100.0 fL   MCH 29.6 26.0 - 34.0 pg   MCHC 30.6 30.0 - 36.0 g/dL   RDW 21.3 08.6 - 57.8 %   Platelets 153 150 - 400 K/uL   nRBC 0.0 0.0 - 0.2 %    Comment: Performed at [facility], [address] [place], [place] [address]  Magnesium     Status: None   Collection Time: 05/26/23  4:41 AM  Result Value Ref Range   Magnesium 1.7 1.7 - 2.4 mg/dL    Comment: Performed at [facility], [address] [place], [place] [address]  Phosphorus     Status: None   Collection Time: 05/26/23  4:41 AM  Result Value Ref Range   Phosphorus 3.7 2.5 - 4.6 mg/dL    Comment: Performed at [facility], [address], [place], [place] [address]  Culture, blood (Routine X 2) w Reflex to ID Panel     Status: None (Preliminary result)   Collection Time: 05/26/23  4:41 AM   Specimen: BLOOD LEFT HAND  Result Value Ref Range   Specimen Description BLOOD LEFT HAND    Special Requests BOTTLES DRAWN AEROBIC AND ANAEROBIC    Culture      NO GROWTH < 12 HOURS Performed at [facility], [address], [place], [place] [address]    Report Status PENDING   Resp panel by RT-PCR (RSV, Flu A&B, Covid) Anterior Nasal Swab     Status: None   Collection Time: 05/26/23  8:34 AM   Specimen: Anterior Nasal Swab  Result Value Ref Range   SARS Coronavirus 2 by RT PCR NEGATIVE NEGATIVE    Comment: (NOTE) SARS-CoV-2 target nucleic acids are NOT DETECTED.  The SARS-CoV-2 RNA is generally detectable in upper respiratory specimens during the acute phase of infection. The lowest concentration of SARS-CoV-2 viral copies this assay can detect is 138 copies/mL. A negative result does not preclude SARS-Cov-2 infection and should not be used as the sole basis for treatment or other patient management  decisions. A negative result may occur with  improper specimen collection/handling, submission of specimen other than nasopharyngeal swab, presence of viral mutation(s) within the areas targeted by this assay, and inadequate number of viral copies(<138  copies/mL). A negative result must be combined with clinical observations, patient history, and epidemiological information. The expected result is Negative.  Fact Sheet for Patients:  BloggerCourse.com  Fact Sheet for Healthcare Providers:  SeriousBroker.it  This test is no t yet approved or cleared by the [place] FDA and  has been authorized for detection and/or diagnosis of SARS-CoV-2 by FDA under an Emergency Use Authorization (EUA). This EUA will remain  in effect (meaning this test can be used) for the duration of the COVID-19 declaration under Section 564(b)(1) of the Act, 21 U.S.C.section 360bbb-3(b)(1), unless the authorization is terminated  or revoked sooner.       Influenza A by PCR NEGATIVE NEGATIVE   Influenza B by PCR NEGATIVE NEGATIVE    Comment: (NOTE) The Xpert Xpress SARS-CoV-2/FLU/RSV plus assay is intended as an aid in the diagnosis of influenza from Nasopharyngeal swab specimens and should not be used as a sole basis for treatment. Nasal washings and aspirates are unacceptable for Xpert Xpress SARS-CoV-2/FLU/RSV testing.  Fact Sheet for Patients: BloggerCourse.com  Fact Sheet for Healthcare Providers: SeriousBroker.it  This test is not yet approved or cleared by the [place] FDA and has been authorized for detection and/or diagnosis of SARS-CoV-2 by FDA under an Emergency Use Authorization (EUA). This EUA will remain in effect (meaning this test can be used) for the duration of the COVID-19 declaration under Section 564(b)(1) of the Act, 21 U.S.C. section 360bbb-3(b)(1), unless the authorization  is terminated or revoked.     Resp Syncytial Virus by PCR NEGATIVE NEGATIVE    Comment: (NOTE) Fact Sheet for Patients: BloggerCourse.com  Fact Sheet for Healthcare Providers: SeriousBroker.it  This test is not yet approved or cleared by the [place] FDA and has been authorized for detection and/or diagnosis of SARS-CoV-2 by FDA under an Emergency Use Authorization (EUA). This EUA will remain in effect (meaning this test can be used) for the duration of the COVID-19 declaration under Section 564(b)(1) of the Act, 21 U.S.C. section 360bbb-3(b)(1), unless the authorization is terminated or revoked.  Performed at [facility], [address], [place], [place] [address]     DG CHEST PORT 1 VIEW Result Date: 05/26/2023 CLINICAL DATA:  73 year old female with shortness of breath. History of lung cancer. EXAM: PORTABLE CHEST 1 VIEW COMPARISON:  Cardiac CT 04/02/2023 and earlier. FINDINGS: Portable AP semi upright view at 0815 hours. Chronic appearing hilar architectural distortion greater on the right is stable since December radiographs. Gastric hiatal hernia better demonstrated by CT. Other mediastinal contours are within normal limits. Lower lung volumes since December. No pneumothorax, pulmonary edema, pleural effusion or acute lung opacity. Visualized tracheal air column is within normal limits. Stable visualized osseous structures. Negative visible bowel gas. IMPRESSION: Mildly lower lung volumes with otherwise stable post treatment appearance of the chest since December. Electronically Signed   By: Odessa Fleming M.D.   On: 05/26/2023 11:21   CT ABDOMEN PELVIS W CONTRAST Result Date: 05/25/2023 CLINICAL DATA:  Acute nonlocalized abdominal pain, right upper quadrant abdominal pain EXAM: CT ABDOMEN AND PELVIS WITH CONTRAST TECHNIQUE: Multidetector CT imaging of the abdomen and pelvis was performed using the standard protocol following bolus  administration of intravenous contrast. RADIATION DOSE REDUCTION: This exam was performed according to the departmental dose-optimization program which includes automated exposure control, adjustment of the mA and/or kV according to patient size and/or use of iterative reconstruction technique. CONTRAST:  80mL OMNIPAQUE IOHEXOL 300 MG/ML  SOLN COMPARISON:  10/14/2019 FINDINGS: Lower chest: No  acute abnormality. Moderate hiatal hernia. The distal esophagus is fluid-filled suggesting changes of gastroesophageal reflux. 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 Hepatobiliary: Mild perihepatic ascites. The gallbladder is distended, there is gallbladder wall thickening, pericholecystic edema within the gallbladder fossa, and extensive peri-cholecystic inflammatory stranding in keeping with changes of acute cholecystitis. Liver unremarkable. No intra or extrahepatic biliary ductal dilation. Pancreas: Unremarkable Spleen: Unremarkable Adrenals/Urinary Tract: Adrenal glands are unremarkable. Simple cortical cysts are seen within the right kidney for which no follow-up imaging is recommended. The kidneys are otherwise unremarkable. Bladder unremarkable. Stomach/Bowel: Moderate descending and sigmoid colonic diverticulosis. Stomach, small bowel, and large bowel are otherwise unremarkable. Appendix absent. No evidence of obstruction or focal inflammation. No free intraperitoneal gas or fluid. Vascular/Lymphatic: Aortic atherosclerosis. No enlarged abdominal or pelvic lymph nodes. Reproductive: Status post hysterectomy. No adnexal masses. Other: No abdominal wall hernia Musculoskeletal: Chronic anterior wedge compression deformity of L1 is seen with  approximately 50% loss of height anteriorly, stable from PET CT of 03/01/2023. No retropulsion. Multiple sclerotic lesions have developed within the a spine at T11, L5, and S2 which are stable since PET CT examination of 03/01/2023 and are compatible with treated metastatic disease. IMPRESSION: 1. Acute cholecystitis. 2. Moderate hiatal hernia. Fluid-filled distal esophagus suggesting changes of gastroesophageal reflux. 3. Moderate distal colonic diverticulosis without superimposed acute inflammatory change. 4. Stable sclerotic lesions within the spine at T11, L5, and S2 compatible with treated metastatic disease. Aortic Atherosclerosis (ICD10-I70.0). Electronically Signed   By: Helyn Numbers M.D.   On: 05/25/2023 23:00     Assessment & Plan:  Linda Woods is a 73 y.o. female who was admitted with acute cholecystitis and subsequently had a likely COPD exacerbation this morning requiring transfer down to the ICU for close clinical monitoring.  -Discussed the pathophysiology of gallbladder disease with both the patient and the patient's husband.  Discussed that the normal next step with treatment for cholecystitis would be cholecystectomy.  However, given her more concerning respiratory status with acute respiratory failure with hypoxia/COPD exacerbation and her tachycardia today, I feel the safer option would be to proceed with IR consultation for cholecystostomy tube placement.  Discussed that all of her respiratory and cardiac issues could be related to her cholecystitis, however they still increase her risk for undergoing anesthesia, especially with her other medical comorbidities. -IR consultation for cholecystostomy tube -Would maintain the patient NPO currently especially with her change in her hemodynamic status. Ok for sips with meds -Continue IV Zosyn. -IV fluids per hospitalist -ECHO ordered by hospitalist -Also discussed that patient will likely need cholecystectomy in the future  pending how she progresses after cholecystostomy tube placement -I spoke with the patient's husband on the phone and discussed our plan moving forward for treatment of her cholecystitis -Appreciate hospitalist recommendations  All questions were answered to the satisfaction of the patient and family.  Note: Portions of this report may have been transcribed using voice recognition software. Every effort has been made to ensure accuracy; however, inadvertent computerized transcription errors may still be present.   -- Theophilus Kinds, DO [facility] [address] [place], [place] [address] [phone] (office)

## 2023-05-27 ENCOUNTER — Encounter (HOSPITAL_COMMUNITY)
Admission: EM | Disposition: A | Discharge status: Home Health | Payer: Self-pay | Source: Home / Self Care | Attending: Family Medicine

## 2023-05-27 ENCOUNTER — Other Ambulatory Visit (HOSPITAL_COMMUNITY): Payer: Self-pay | Admitting: *Deleted

## 2023-05-27 ENCOUNTER — Inpatient Hospital Stay (HOSPITAL_COMMUNITY)

## 2023-05-27 DIAGNOSIS — I4719 Other supraventricular tachycardia: Secondary | ICD-10-CM | POA: Diagnosis not present

## 2023-05-27 DIAGNOSIS — I10 Essential (primary) hypertension: Secondary | ICD-10-CM | POA: Diagnosis not present

## 2023-05-27 DIAGNOSIS — C3492 Malignant neoplasm of unspecified part of left bronchus or lung: Secondary | ICD-10-CM | POA: Diagnosis not present

## 2023-05-27 DIAGNOSIS — K810 Acute cholecystitis: Secondary | ICD-10-CM | POA: Diagnosis not present

## 2023-05-27 LAB — CBC
HCT: 24.7 % — ABNORMAL LOW (ref 36.0–46.0)
Hemoglobin: 7.6 g/dL — ABNORMAL LOW (ref 12.0–15.0)
MCH: 29.2 pg (ref 26.0–34.0)
MCHC: 30.8 g/dL (ref 30.0–36.0)
MCV: 95 fL (ref 80.0–100.0)
Platelets: 141 10*3/uL — ABNORMAL LOW (ref 150–400)
RBC: 2.6 MIL/uL — ABNORMAL LOW (ref 3.87–5.11)
RDW: 13.3 % (ref 11.5–15.5)
WBC: 10.3 10*3/uL (ref 4.0–10.5)
nRBC: 0 % (ref 0.0–0.2)

## 2023-05-27 LAB — ECHOCARDIOGRAM COMPLETE
AR max vel: 2.09 cm2
AV Area VTI: 2.43 cm2
AV Area mean vel: 2.46 cm2
AV Mean grad: 4.8 mmHg
AV Peak grad: 9.4 mmHg
Ao pk vel: 1.54 m/s
Area-P 1/2: 4.68 cm2
Height: 64 in
S' Lateral: 2.5 cm
Weight: 2352 [oz_av]

## 2023-05-27 LAB — BASIC METABOLIC PANEL WITH GFR
Anion gap: 9 (ref 5–15)
BUN: 16 mg/dL (ref 8–23)
CO2: 23 mmol/L (ref 22–32)
Calcium: 8.7 mg/dL — ABNORMAL LOW (ref 8.9–10.3)
Chloride: 105 mmol/L (ref 98–111)
Creatinine, Ser: 0.89 mg/dL (ref 0.44–1.00)
GFR, Estimated: >60 mL/min (ref >60)
Glucose, Bld: 184 mg/dL — ABNORMAL HIGH (ref 70–99)
Potassium: 3.1 mmol/L — ABNORMAL LOW (ref 3.5–5.1)
Sodium: 137 mmol/L (ref 135–145)

## 2023-05-27 LAB — MAGNESIUM: Magnesium: 1.9 mg/dL (ref 1.7–2.4)

## 2023-05-27 SURGERY — CHOLECYSTECTOMY, ROBOT-ASSISTED, LAPAROSCOPIC
Anesthesia: General

## 2023-05-27 MED ORDER — CHLORHEXIDINE GLUCONATE CLOTH 2 % EX PADS
6.0000 | MEDICATED_PAD | Freq: Every day | CUTANEOUS | Status: DC
  Administered 2023-05-27 – 2023-05-29 (×3): 6 via TOPICAL

## 2023-05-27 MED ORDER — MELATONIN 3 MG PO TABS
6.0000 mg | ORAL_TABLET | Freq: Every day | ORAL | Status: DC
  Administered 2023-05-27 – 2023-05-29 (×3): 6 mg via ORAL
  Filled 2023-05-27 (×3): qty 2

## 2023-05-27 MED ORDER — ALUM & MAG HYDROXIDE-SIMETH 200-200-20 MG/5ML PO SUSP
15.0000 mL | Freq: Four times a day (QID) | ORAL | Status: DC | PRN
  Administered 2023-05-27: 15 mL via ORAL
  Filled 2023-05-27: qty 30

## 2023-05-27 MED ORDER — GUAIFENESIN-DM 100-10 MG/5ML PO SYRP
5.0000 mL | ORAL_SOLUTION | ORAL | Status: DC | PRN
Start: 2023-05-27 — End: 2023-05-30
  Administered 2023-05-27: 5 mL via ORAL
  Filled 2023-05-27: qty 5

## 2023-05-27 MED ORDER — METOPROLOL TARTRATE 25 MG PO TABS
25.0000 mg | ORAL_TABLET | Freq: Two times a day (BID) | ORAL | Status: DC
  Administered 2023-05-27: 25 mg via ORAL
  Filled 2023-05-27: qty 1

## 2023-05-27 MED ORDER — POTASSIUM CHLORIDE IN NACL 20-0.9 MEQ/L-% IV SOLN
INTRAVENOUS | Status: AC
Start: 2023-05-27 — End: 2023-05-28

## 2023-05-27 MED ORDER — POTASSIUM CHLORIDE CRYS ER 20 MEQ PO TBCR: 20.0000 meq | EXTENDED_RELEASE_TABLET | Freq: Once | ORAL | Status: DC

## 2023-05-27 MED ORDER — PHENOL 1.4 % MT LIQD: 1.0000 | OROMUCOSAL | Status: DC | PRN

## 2023-05-27 MED ORDER — POTASSIUM CHLORIDE CRYS ER 20 MEQ PO TBCR
30.0000 meq | EXTENDED_RELEASE_TABLET | Freq: Once | ORAL | Status: AC
  Administered 2023-05-27: 30 meq via ORAL
  Filled 2023-05-27: qty 1

## 2023-05-27 NOTE — Progress Notes (Signed)
*  PRELIMINARY RESULTS* Echocardiogram 2D Echocardiogram has been performed.  Linda Woods 05/27/2023, 2:56 PM

## 2023-05-27 NOTE — Progress Notes (Addendum)
 PROGRESS NOTE  Linda Woods ZOX:[id number] DOB: 1950-08-01 DOA: 05/25/2023 PCP: Assunta Found, MD  Brief History:  73 year old female with a history of non-small cell lung cancer, hypertension, hyperlipidemia, hypothyroidism, obstructive lung disease/asthma presenting with right-sided abdominal pain that began on the morning of 05/25/2023.  She had complained of nausea but no emesis.  There is no diarrhea, hematochezia, melena.  She denied any chest pain or shortness of breath at the time of admission.  In the ED, the patient was afebrile and hemodynamically stable.  Oxygen saturation was 92% on room air.  WBC 9.6, hemoglobin 10.5, platelets 165.  Sodium 136, potassium 3.9, bicarbonate 23, serum creatinine 1.11.  AST 40, ALT 44, alk phosphatase 129, total bilirubin 0.6.  Lipase 62.   CT of the abdomen and pelvis showed gallbladder wall thickening with pericholecystic edema and distended gallbladder.  Patient was started IV Zosyn.  General surgery was consulted.  The patient was admitted for evaluation of acute cholecystitis  On the morning of 05/26/2023, the patient was short of breath and developed tachycardia with heart rate in the 140s.  She was moved to the stepdown unit for COPD exacerbation and atrial tachycardia.  She was started on bronchodilators, steroids, and diltiazem.   Assessment/Plan: Acute cholecystitis -General Surgery consult appreciated>>consult IR for cholecystotomy tube -Continue Zosyn -continue IV fluids -CT AP as discussed above -RUQ US--Dilated gallbladder with some sludge. No shadowing stones or further sonographic evidence of acute cholecystitis -05/27/23>>discussed with IR>>HIDA scan to clarify need for chole tube -05/27/23--discussed with Dr. Kandy Garrison HIDA is neg>>can d/c home with po abx   Acute respiratory failure with hypoxia -secondary to COPD exacerbation -stable on 2L -Start Brovana -Start Pulmicort -Start DuoNebs -Continue IV  steroids -check COVID--neg -check viral resp panel -Personally reviewed chest x-ray--no acute infiltrates or edema   COPD exacerbation -Treatment as above   Atrial tachycardia/MAT -Personally reviewed EKG 4/6>> atrial tachycardia, nonspecific STT wave changes -Start diltiazem drip initially>>stopped due to low BPs -4/7 Echo EF 60-65%, no WMA, normal RVF -now back in sinus -restart metoprolol   Essential hypertension -restart metoprolol   Hypothyroidism -Continue Synthroid -Check TSH   NSCLC -Initially diagnosed 09/2019 -s/p 7 cycles paclitaxel and carboplatin -Continue Tagrisso -Follow-up Dr. Shirline Frees   Mixed hyperlipidemia -Continue statin   Status post right TKA -Patient had surgery 04/16/2023 -PT evaluation when stable    Hypokalemia -replete -mag 1.9             Family Communication:   spouse updated at bedside 4/7   Consultants:  general surgery, IR   Code Status:  FULL    DVT Prophylaxis:  SCDs     Procedures: As Listed in Progress Note Above   Antibiotics: Zosyn 4/5>> Doxy 4/6>>     Subjective: Pt states abd pain is controlled with opioids.  She is breathing better.  Has dry cough.  Denies f/c, cp, n/v/d, abd pain  Objective: Vitals:   05/27/23 1300 05/27/23 1400 05/27/23 1500 05/27/23 1554  BP: (!) 134/57 (!) 141/62 (!) 131/59   Pulse: (!) 122 (!) 117 (!) 114   Resp: 12 12 13    Temp:    98.3 F (36.8 C)  TempSrc:    Oral  SpO2: 98% 97% 97%   Weight:      Height:        Intake/Output Summary (Last 24 hours) at 05/27/2023 1702 Last data filed at 05/27/2023 0542 Gross per 24 hour  Intake  2034.22 ml  Output 1000 ml  Net 1034.22 ml   Weight change:  Exam:  General:  Pt is alert, follows commands appropriately, not in acute distress HEENT: No icterus, No thrush, No neck mass, [place]/AT Cardiovascular: RRR, S1/S2, no rubs, no gallops Respiratory: bilateral rales.  Bilateral wheeze Abdomen: Soft/+BS, non tender, non distended, no  guarding Extremities: No edema, No lymphangitis, No petechiae, No rashes, no synovitis   Data Reviewed: I have personally reviewed following labs and imaging studies Basic Metabolic Panel: Recent Labs  Lab 05/25/23 2204 05/25/23 2209 05/26/23 0441 05/26/23 1631 05/27/23 0407  NA 136 137 135 134* 137  K 3.9 3.9 4.0 3.6 3.1*  CL 101 103 101 101 105  CO2 23  --  24 23 23   GLUCOSE 149* 149* 134* 202* 184*  BUN 12 10 11 13 16   CREATININE 1.11* 1.20* 0.91 1.04* 0.89  CALCIUM 9.5  --  9.0 8.6* 8.7*  MG  --   --  1.7 2.0 1.9  PHOS  --   --  3.7  --   --    Liver Function Tests: Recent Labs  Lab 05/25/23 2204 05/26/23 0441  AST 48* 35  ALT 44 38  ALKPHOS 129* 124  BILITOT 0.6 0.6  PROT 6.9 6.3*  ALBUMIN 3.9 3.6   Recent Labs  Lab 05/25/23 2204  LIPASE 62*   No results for input(s): "AMMONIA" in the last 168 hours. Coagulation Profile: No results for input(s): "INR", "PROTIME" in the last 168 hours. CBC: Recent Labs  Lab 05/25/23 2204 05/25/23 2209 05/26/23 0441 05/27/23 0407  WBC 9.6  --  11.1* 10.3  HGB 10.5* 11.2* 9.5* 7.6*  HCT 33.6* 33.0* 31.0* 24.7*  MCV 94.6  --  96.6 95.0  PLT 165  --  153 141*   Cardiac Enzymes: No results for input(s): "CKTOTAL", "CKMB", "CKMBINDEX", "TROPONINI" in the last 168 hours. BNP: Invalid input(s): "POCBNP" CBG: No results for input(s): "GLUCAP" in the last 168 hours. HbA1C: No results for input(s): "HGBA1C" in the last 72 hours. Urine analysis:    Component Value Date/Time   COLORURINE YELLOW 05/25/2023 2140   APPEARANCEUR CLEAR 05/25/2023 2140   LABSPEC 1.039 (H) 05/25/2023 2140   PHURINE 5.0 05/25/2023 2140   GLUCOSEU NEGATIVE 05/25/2023 2140   HGBUR NEGATIVE 05/25/2023 2140   BILIRUBINUR NEGATIVE 05/25/2023 2140   BILIRUBINUR n 10/12/2014 1040   KETONESUR NEGATIVE 05/25/2023 2140   PROTEINUR NEGATIVE 05/25/2023 2140   UROBILINOGEN negative 10/12/2014 1040   UROBILINOGEN 0.2 10/14/2009 0900   NITRITE  NEGATIVE 05/25/2023 2140   LEUKOCYTESUR NEGATIVE 05/25/2023 2140   Sepsis Labs: @LABRCNTIP (procalcitonin:4,lacticidven:4) ) Recent Results (from the past 240 hours)  Culture, blood (Routine X 2) w Reflex to ID Panel     Status: None (Preliminary result)   Collection Time: 05/26/23  4:29 AM   Specimen: Left Antecubital; Blood  Result Value Ref Range Status   Specimen Description LEFT ANTECUBITAL  Final   Special Requests BOTTLES DRAWN AEROBIC AND ANAEROBIC  Final   Culture   Final    NO GROWTH 1 DAY Performed at [facility], [address] [place], [place] [address]    Report Status PENDING  Incomplete  Culture, blood (Routine X 2) w Reflex to ID Panel     Status: None (Preliminary result)   Collection Time: 05/26/23  4:41 AM   Specimen: BLOOD LEFT HAND  Result Value Ref Range Status   Specimen Description BLOOD LEFT HAND  Final   Special  Requests BOTTLES DRAWN AEROBIC AND ANAEROBIC  Final   Culture   Final    NO GROWTH 1 DAY Performed at [facility], [address], [place], [place] [address]    Report Status PENDING  Incomplete  Resp panel by RT-PCR (RSV, Flu A&B, Covid) Anterior Nasal Swab     Status: None   Collection Time: 05/26/23  8:34 AM   Specimen: Anterior Nasal Swab  Result Value Ref Range Status   SARS Coronavirus 2 by RT PCR NEGATIVE NEGATIVE Final    Comment: (NOTE) SARS-CoV-2 target nucleic acids are NOT DETECTED.  The SARS-CoV-2 RNA is generally detectable in upper respiratory specimens during the acute phase of infection. The lowest concentration of SARS-CoV-2 viral copies this assay can detect is 138 copies/mL. A negative result does not preclude SARS-Cov-2 infection and should not be used as the sole basis for treatment or other patient management decisions. A negative result may occur with  improper specimen collection/handling, submission of specimen other than nasopharyngeal swab, presence of viral mutation(s) within the areas targeted by this assay,  and inadequate number of viral copies(<138 copies/mL). A negative result must be combined with clinical observations, patient history, and epidemiological information. The expected result is Negative.  Fact Sheet for Patients:  BloggerCourse.com  Fact Sheet for Healthcare Providers:  SeriousBroker.it  This test is no t yet approved or cleared by the [place] FDA and  has been authorized for detection and/or diagnosis of SARS-CoV-2 by FDA under an Emergency Use Authorization (EUA). This EUA will remain  in effect (meaning this test can be used) for the duration of the COVID-19 declaration under Section 564(b)(1) of the Act, 21 U.S.C.section 360bbb-3(b)(1), unless the authorization is terminated  or revoked sooner.       Influenza A by PCR NEGATIVE NEGATIVE Final   Influenza B by PCR NEGATIVE NEGATIVE Final    Comment: (NOTE) The Xpert Xpress SARS-CoV-2/FLU/RSV plus assay is intended as an aid in the diagnosis of influenza from Nasopharyngeal swab specimens and should not be used as a sole basis for treatment. Nasal washings and aspirates are unacceptable for Xpert Xpress SARS-CoV-2/FLU/RSV testing.  Fact Sheet for Patients: BloggerCourse.com  Fact Sheet for Healthcare Providers: SeriousBroker.it  This test is not yet approved or cleared by the [place] FDA and has been authorized for detection and/or diagnosis of SARS-CoV-2 by FDA under an Emergency Use Authorization (EUA). This EUA will remain in effect (meaning this test can be used) for the duration of the COVID-19 declaration under Section 564(b)(1) of the Act, 21 U.S.C. section 360bbb-3(b)(1), unless the authorization is terminated or revoked.     Resp Syncytial Virus by PCR NEGATIVE NEGATIVE Final    Comment: (NOTE) Fact Sheet for Patients: BloggerCourse.com  Fact Sheet for  Healthcare Providers: SeriousBroker.it  This test is not yet approved or cleared by the [place] FDA and has been authorized for detection and/or diagnosis of SARS-CoV-2 by FDA under an Emergency Use Authorization (EUA). This EUA will remain in effect (meaning this test can be used) for the duration of the COVID-19 declaration under Section 564(b)(1) of the Act, 21 U.S.C. section 360bbb-3(b)(1), unless the authorization is terminated or revoked.  Performed at [facility], [address], [place], [place] [address]   MRSA Next Gen by PCR, Nasal     Status: None   Collection Time: 05/26/23 10:17 AM   Specimen: Nasal Mucosa; Nasal Swab  Result Value Ref Range Status   MRSA by PCR Next Gen NOT  DETECTED NOT DETECTED Final    Comment: (NOTE) The GeneXpert MRSA Assay (FDA approved for NASAL specimens only), is one component of a comprehensive MRSA colonization surveillance program. It is not intended to diagnose MRSA infection nor to guide or monitor treatment for MRSA infections. Test performance is not FDA approved in patients less than 93 years old. Performed at [facility], [address], [place], [place] [address]      Scheduled Meds:  acetaminophen  1,000 mg Oral Q6H   arformoterol  15 mcg Nebulization BID   benzonatate  100 mg Oral BID   budesonide (PULMICORT) nebulizer solution  0.5 mg Nebulization BID   Chlorhexidine Gluconate Cloth  6 each Topical Daily   ipratropium-albuterol  3 mL Nebulization BID   levothyroxine  100 mcg Oral Daily   methylPREDNISolone (SOLU-MEDROL) injection  60 mg Intravenous Q12H   metoprolol tartrate  25 mg Oral BID   midodrine  5 mg Oral TID WC   osimertinib mesylate  80 mg Oral Daily   pantoprazole (PROTONIX) IV  40 mg Intravenous Daily   rosuvastatin  5 mg Oral Daily   Continuous Infusions:  diltiazem (CARDIZEM) infusion Stopped (05/26/23 1130)   doxycycline (VIBRAMYCIN) IV 100 mg (05/27/23 1209)    piperacillin-tazobactam (ZOSYN)  IV 3.375 g (05/27/23 1526)    Procedures/Studies: ECHOCARDIOGRAM COMPLETE Result Date: 05/27/2023    ECHOCARDIOGRAM REPORT   Patient Name:   AMNA WELKER Berndt Date of Exam: 05/27/2023 [facility] Rec #:  [id number]           Height:       64.0 in Accession #:    [id number]          Weight:       147.0 lb Date of Birth:  [birth year]          BSA:          1.716 m Patient Age:    72 years            BP:           112/43 mmHg Patient Gender: F                   HR:           122 bpm. [facility] Procedure: 2D Echo, Cardiac Doppler and Color Doppler (Both Spectral and Color            Flow Doppler were utilized during procedure). Indications:    Atrial tachycardia [facility])  History:        Patient has prior history of Echocardiogram examinations, most                 recent 07/19/2021. COPD; Risk Factors:Hypertension and                 Dyslipidemia. Adenocarcinoma of left lung, stage 3 (HCC).  Sonographer:    Celesta Gentile RCS Referring Phys: [phone] Brandie Lopes IMPRESSIONS  1. Left ventricular ejection fraction, by estimation, is 60 to 65%. The left ventricle has normal function. The left ventricle has no regional wall motion abnormalities. Left ventricular diastolic parameters are indeterminate.  2. Right ventricular systolic function is normal. The right ventricular size is normal.  3. Left atrial size was mildly dilated.  4. The mitral valve is normal in structure. No evidence of mitral valve regurgitation. No evidence of mitral stenosis.  5. The aortic valve is tricuspid. Aortic valve regurgitation is not visualized. No aortic stenosis is present.  6. The inferior vena cava is normal in size with greater than 50% respiratory variability, suggesting right atrial pressure of 3 mmHg. FINDINGS  Left Ventricle: Left ventricular ejection fraction, by estimation, is 60 to 65%. The left ventricle has normal function. The left ventricle has no regional wall motion abnormalities. The  left ventricular internal cavity size was normal in size. There is  no left ventricular hypertrophy. Left ventricular diastolic parameters are indeterminate. Right Ventricle: The right ventricular size is normal. Right vetricular wall thickness was not well visualized. Right ventricular systolic function is normal. Left Atrium: Left atrial size was mildly dilated. Right Atrium: Right atrial size was normal in size. Pericardium: There is no evidence of pericardial effusion. Mitral Valve: The mitral valve is normal in structure. No evidence of mitral valve regurgitation. No evidence of mitral valve stenosis. Tricuspid Valve: The tricuspid valve is normal in structure. Tricuspid valve regurgitation is not demonstrated. No evidence of tricuspid stenosis. Aortic Valve: The aortic valve is tricuspid. Aortic valve regurgitation is not visualized. No aortic stenosis is present. Aortic valve mean gradient measures 4.8 mmHg. Aortic valve peak gradient measures 9.4 mmHg. Aortic valve area, by VTI measures 2.43 cm. Pulmonic Valve: The pulmonic valve was not well visualized. Pulmonic valve regurgitation is not visualized. No evidence of pulmonic stenosis. Aorta: The aortic root is normal in size and structure. Venous: The inferior vena cava is normal in size with greater than 50% respiratory variability, suggesting right atrial pressure of 3 mmHg. IAS/Shunts: No atrial level shunt detected by color flow Doppler.  LEFT VENTRICLE PLAX 2D LVIDd:         5.00 cm LVIDs:         2.50 cm LV PW:         1.00 cm LV IVS:        0.70 cm LVOT diam:     1.90 cm LV SV:         73 LV SV Index:   42 LVOT Area:     2.84 cm  RIGHT VENTRICLE RV S prime:     19.40 cm/s TAPSE (M-mode): 2.3 cm LEFT ATRIUM           Index        RIGHT ATRIUM           Index LA diam:      3.30 cm 1.92 cm/m   RA Area:     16.70 cm LA Vol (A2C): 50.7 ml 29.54 ml/m  RA Volume:   46.30 ml  26.97 ml/m LA Vol (A4C): 62.8 ml 36.59 ml/m  AORTIC VALVE AV Area (Vmax):     2.09 cm AV Area (Vmean):   2.46 cm AV Area (VTI):     2.43 cm AV Vmax:           153.65 cm/s AV Vmean:          101.337 cm/s AV VTI:            0.300 m AV Peak Grad:      9.4 mmHg AV Mean Grad:      4.8 mmHg LVOT Vmax:         113.00 cm/s LVOT Vmean:        88.000 cm/s LVOT VTI:          0.257 m LVOT/AV VTI ratio: 0.86  AORTA Ao Root diam: 3.00 cm MITRAL VALVE MV Area (PHT): 4.68 cm     SHUNTS MV Decel Time: 162 msec  Systemic VTI:  0.26 m MV E velocity: 131.00 cm/s  Systemic Diam: 1.90 cm Dina Rich MD Electronically signed by Dina Rich MD Signature Date/Time: 05/27/2023/3:37:30 PM    Final    US Abdomen Limited RUQ (LIVER/GB) Result Date: 05/27/2023 CLINICAL DATA:  Cholecystitis. EXAM: ULTRASOUND ABDOMEN LIMITED RIGHT UPPER QUADRANT COMPARISON:  CT 05/25/2023 FINDINGS: Gallbladder: Dilated gallbladder. No wall thickening. No adjacent fluid seen on today's examination. There is some layering sludge. No shadowing stones clearly seen at this time. Common bile duct: Diameter: 5 mm Liver: No focal lesion identified. Within normal limits in parenchymal echogenicity. Portal vein is patent on color Doppler imaging with normal direction of blood flow towards the liver. Other: None. IMPRESSION: Dilated gallbladder with some sludge. No shadowing stones or further sonographic evidence of acute cholecystitis. The gallbladder wall thickening and adjacent fluid on the prior CT scan is not well seen on the current ultrasound. Further workup evaluation as clinically appropriate such as HIDA scan or MRI. No biliary ductal dilatation. Electronically Signed   By: Karen Kays M.D.   On: 05/27/2023 12:16   CT Soft Tissue Neck W Contrast Result Date: 05/26/2023 CLINICAL DATA:  73 year old female with neck mass. Non-small cell lung cancer. EXAM: CT NECK WITH CONTRAST TECHNIQUE: Multidetector CT imaging of the neck was performed using the standard protocol following the bolus administration of intravenous contrast.  RADIATION DOSE REDUCTION: This exam was performed according to the departmental dose-optimization program which includes automated exposure control, adjustment of the mA and/or kV according to patient size and/or use of iterative reconstruction technique. CONTRAST:  60mL OMNIPAQUE IOHEXOL 300 MG/ML  SOLN COMPARISON:  PET-CT 03/01/2023.  Neck CT 10/13/2019. FINDINGS: Pharynx and larynx: Larynx and pharynx soft tissue contours are within normal limits. Parapharyngeal and retropharyngeal spaces appear negative. Salivary glands: Some bilateral submandibular gland atrophy since 2021. Otherwise negative. Negative sublingual space. Thyroid: Negative. Lymph nodes: Regressed left lower neck lymph nodes described in 2021, punctate now on series 2, image 69. No cervical lymphadenopathy. No marked area of clinical concern identified. Vascular: Major vascular structures in the bilateral neck and at the skull base are enhancing and patent. Carotid bifurcation atherosclerosis. Limited intracranial: Negative. Visualized orbits: Negative. Mastoids and visualized paranasal sinuses: Clear. Skeleton: Cervical spine degeneration not significantly changed since 2021. No acute osseous abnormality identified. Upper chest: Stable to PET-CT 03/01/2023 (please see that report). IMPRESSION: 1. No acute or metastatic finding identified in the Neck. No neck mass is identified. 2. Visible upper Chest stable to PET-CT 03/01/2023. Electronically Signed   By: Odessa Fleming M.D.   On: 05/26/2023 11:30   DG CHEST PORT 1 VIEW Result Date: 05/26/2023 CLINICAL DATA:  73 year old female with shortness of breath. History of lung cancer. EXAM: PORTABLE CHEST 1 VIEW COMPARISON:  Cardiac CT 04/02/2023 and earlier. FINDINGS: Portable AP semi upright view at 0815 hours. Chronic appearing hilar architectural distortion greater on the right is stable since December radiographs. Gastric hiatal hernia better demonstrated by CT. Other mediastinal contours are within  normal limits. Lower lung volumes since December. No pneumothorax, pulmonary edema, pleural effusion or acute lung opacity. Visualized tracheal air column is within normal limits. Stable visualized osseous structures. Negative visible bowel gas. IMPRESSION: Mildly lower lung volumes with otherwise stable post treatment appearance of the chest since December. Electronically Signed   By: Odessa Fleming M.D.   On: 05/26/2023 11:21   CT ABDOMEN PELVIS W CONTRAST Result Date: 05/25/2023 CLINICAL DATA:  Acute nonlocalized abdominal pain, right upper quadrant abdominal  pain EXAM: CT ABDOMEN AND PELVIS WITH CONTRAST TECHNIQUE: Multidetector CT imaging of the abdomen and pelvis was performed using the standard protocol following bolus administration of intravenous contrast. RADIATION DOSE REDUCTION: This exam was performed according to the departmental dose-optimization program which includes automated exposure control, adjustment of the mA and/or kV according to patient size and/or use of iterative reconstruction technique. CONTRAST:  80mL OMNIPAQUE IOHEXOL 300 MG/ML  SOLN COMPARISON:  10/14/2019 FINDINGS: Lower chest: No acute abnormality. Moderate hiatal hernia. The distal esophagus is fluid-filled suggesting changes of gastroesophageal reflux. 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 Hepatobiliary: Mild perihepatic ascites. The gallbladder is distended, there is gallbladder wall thickening, pericholecystic edema within the gallbladder fossa, and extensive peri-cholecystic inflammatory stranding in keeping with changes of acute cholecystitis. Liver unremarkable. No intra or extrahepatic biliary ductal dilation. Pancreas: Unremarkable Spleen:  Unremarkable Adrenals/Urinary Tract: Adrenal glands are unremarkable. Simple cortical cysts are seen within the right kidney for which no follow-up imaging is recommended. The kidneys are otherwise unremarkable. Bladder unremarkable. Stomach/Bowel: Moderate descending and sigmoid colonic diverticulosis. Stomach, small bowel, and large bowel are otherwise unremarkable. Appendix absent. No evidence of obstruction or focal inflammation. No free intraperitoneal gas or fluid. Vascular/Lymphatic: Aortic atherosclerosis. No enlarged abdominal or pelvic lymph nodes. Reproductive: Status post hysterectomy. No adnexal masses. Other: No abdominal wall hernia Musculoskeletal: Chronic anterior wedge compression deformity of L1 is seen with approximately 50% loss of height anteriorly, stable from PET CT of 03/01/2023. No retropulsion. Multiple sclerotic lesions have developed within the a spine at T11, L5, and S2 which are stable since PET CT examination of 03/01/2023 and are compatible with treated metastatic disease. IMPRESSION: 1. Acute cholecystitis. 2. Moderate hiatal hernia. Fluid-filled distal esophagus suggesting changes of gastroesophageal reflux. 3. Moderate distal colonic diverticulosis without superimposed acute inflammatory change. 4. Stable sclerotic lesions within the spine at T11, L5, and S2 compatible with treated metastatic disease. Aortic Atherosclerosis (ICD10-I70.0). Electronically Signed   By: Helyn Numbers M.D.   On: 05/25/2023 23:00    Catarina Hartshorn, DO  [facility]  If 7PM-7AM, please contact night-coverage [email or web address] Password [email or web address] 05/27/2023, 5:02 PM   LOS: 2 days

## 2023-05-27 NOTE — Progress Notes (Signed)
 [facility] Progress Note     Subjective: Patient seen and examined.  She is resting comfortably in bed.  She remains on 2 L nasal cannula and has been persistently tachycardic since transfer down to the ICU.  She denies nausea and vomiting, and her pain has been well-controlled with medications.  She does feel thirsty.  Objective: Vital signs in last 24 hours: Temp:  [98.1 F (36.7 C)-98.6 F (37 C)] 98.6 F (37 C) (04/07 1201) Pulse Rate:  [104-134] 125 (04/07 1100) Resp:  [13-21] 16 (04/07 1100) BP: (78-145)/(33-63) 128/55 (04/07 1100) SpO2:  [91 %-100 %] 99 % (04/07 1100) Last BM Date : 05/25/23  Intake/Output from previous day: 04/06 0701 - 04/07 0700 In: 2034.2 [P.O.:240; I.V.:1206; IV Piggyback:588.2] Out: 1000 [Urine:1000] Intake/Output this shift: No intake/output data recorded.  General appearance: alert, cooperative, and no distress GI: Abdomen soft, nondistended, no percussion tenderness, nontender to palpation; no rigidity, guarding, or rebound tenderness; negative Murphy sign  Lab Results:  Recent Labs    05/26/23 0441 05/27/23 0407  WBC 11.1* 10.3  HGB 9.5* 7.6*  HCT 31.0* 24.7*  PLT 153 141*   BMET Recent Labs    05/26/23 1631 05/27/23 0407  NA 134* 137  K 3.6 3.1*  CL 101 105  CO2 23 23  GLUCOSE 202* 184*  BUN 13 16  CREATININE 1.04* 0.89  CALCIUM 8.6* 8.7*   PT/INR No results for input(s): "LABPROT", "INR" in the last 72 hours.  Studies/Results: US Abdomen Limited RUQ (LIVER/GB) Result Date: 05/27/2023 CLINICAL DATA:  Cholecystitis. EXAM: ULTRASOUND ABDOMEN LIMITED RIGHT UPPER QUADRANT COMPARISON:  CT 05/25/2023 FINDINGS: Gallbladder: Dilated gallbladder. No wall thickening. No adjacent fluid seen on today's examination. There is some layering sludge. No shadowing stones clearly seen at this time. Common bile duct: Diameter: 5 mm Liver: No focal lesion identified. Within normal limits in parenchymal echogenicity. Portal vein  is patent on color Doppler imaging with normal direction of blood flow towards the liver. Other: None. IMPRESSION: Dilated gallbladder with some sludge. No shadowing stones or further sonographic evidence of acute cholecystitis. The gallbladder wall thickening and adjacent fluid on the prior CT scan is not well seen on the current ultrasound. Further workup evaluation as clinically appropriate such as HIDA scan or MRI. No biliary ductal dilatation. Electronically Signed   By: Karen Kays M.D.   On: 05/27/2023 12:16   DG CHEST PORT 1 VIEW Result Date: 05/26/2023 CLINICAL DATA:  73 year old female with shortness of breath. History of lung cancer. EXAM: PORTABLE CHEST 1 VIEW COMPARISON:  Cardiac CT 04/02/2023 and earlier. FINDINGS: Portable AP semi upright view at 0815 hours. Chronic appearing hilar architectural distortion greater on the right is stable since December radiographs. Gastric hiatal hernia better demonstrated by CT. Other mediastinal contours are within normal limits. Lower lung volumes since December. No pneumothorax, pulmonary edema, pleural effusion or acute lung opacity. Visualized tracheal air column is within normal limits. Stable visualized osseous structures. Negative visible bowel gas. IMPRESSION: Mildly lower lung volumes with otherwise stable post treatment appearance of the chest since December. Electronically Signed   By: Odessa Fleming M.D.   On: 05/26/2023 11:21   CT ABDOMEN PELVIS W CONTRAST Result Date: 05/25/2023 CLINICAL DATA:  Acute nonlocalized abdominal pain, right upper quadrant abdominal pain EXAM: CT ABDOMEN AND PELVIS WITH CONTRAST TECHNIQUE: Multidetector CT imaging of the abdomen and pelvis was performed using the standard protocol following bolus administration of intravenous contrast. RADIATION DOSE REDUCTION: This exam was performed according  to the departmental dose-optimization program which includes automated exposure control, adjustment of the mA and/or kV according to  patient size and/or use of iterative reconstruction technique. CONTRAST:  80mL OMNIPAQUE IOHEXOL 300 MG/ML  SOLN COMPARISON:  10/14/2019 FINDINGS: Lower chest: No acute abnormality. Moderate hiatal hernia. The distal esophagus is fluid-filled suggesting changes of gastroesophageal reflux. 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 Hepatobiliary: Mild perihepatic ascites. The gallbladder is distended, there is gallbladder wall thickening, pericholecystic edema within the gallbladder fossa, and extensive peri-cholecystic inflammatory stranding in keeping with changes of acute cholecystitis. Liver unremarkable. No intra or extrahepatic biliary ductal dilation. Pancreas: Unremarkable Spleen: Unremarkable Adrenals/Urinary Tract: Adrenal glands are unremarkable. Simple cortical cysts are seen within the right kidney for which no follow-up imaging is recommended. The kidneys are otherwise unremarkable. Bladder unremarkable. Stomach/Bowel: Moderate descending and sigmoid colonic diverticulosis. Stomach, small bowel, and large bowel are otherwise unremarkable. Appendix absent. No evidence of obstruction or focal inflammation. No free intraperitoneal gas or fluid. Vascular/Lymphatic: Aortic atherosclerosis. No enlarged abdominal or pelvic lymph nodes. Reproductive: Status post hysterectomy. No adnexal masses. Other: No abdominal wall hernia Musculoskeletal: Chronic anterior wedge compression deformity of L1 is seen with approximately 50% loss of height anteriorly, stable from PET CT of 03/01/2023. No retropulsion. Multiple sclerotic lesions have developed within the a spine at T11, L5, and S2 which are stable since PET CT examination of 03/01/2023  and are compatible with treated metastatic disease. IMPRESSION: 1. Acute cholecystitis. 2. Moderate hiatal hernia. Fluid-filled distal esophagus suggesting changes of gastroesophageal reflux. 3. Moderate distal colonic diverticulosis without superimposed acute inflammatory change. 4. Stable sclerotic lesions within the spine at T11, L5, and S2 compatible with treated metastatic disease. Aortic Atherosclerosis (ICD10-I70.0). Electronically Signed   By: Helyn Numbers M.D.   On: 05/25/2023 23:00    Anti-infectives: Anti-infectives (From admission, onward)    Start     Dose/Rate Route Frequency Ordered Stop   05/26/23 1200  doxycycline (VIBRAMYCIN) 100 mg in sodium chloride 0.9 % 250 mL IVPB        100 mg 125 mL/hr over 120 Minutes Intravenous Every 12 hours 05/26/23 0957     05/26/23 0600  piperacillin-tazobactam (ZOSYN) IVPB 3.375 g        3.375 g 12.5 mL/hr over 240 Minutes Intravenous Every 8 hours 05/26/23 0308     05/25/23 2315  piperacillin-tazobactam (ZOSYN) IVPB 3.375 g        3.375 g 100 mL/hr over 30 Minutes Intravenous  Once 05/25/23 2306 05/26/23 0014       Assessment/Plan:  Patient is a 73 year old female who is admitted with an acute cholecystitis picture and subsequently had a COPD exacerbation requiring transfer to the ICU.  -Patient underwent an abdominal ultrasound this morning ordered by IR, which demonstrates distended gallbladder with sludge without any stones or other evidence of cholecystitis (the previously seen wall thickening and fluid on CT is not appreciated on ultrasound) - IR opted to obtain a HIDA scan on the patient prior to proceeding with cholecystostomy tube - I discussed with the patient that it is reasonable for [name] to fully rule in or out acute cholecystitis prior to proceeding with something like a cholecystostomy tube - If her HIDA scan is negative, could initiate a diet  and give her short course of antibiotics as treatment - NPO for HIDA scan -  Further recommendations to follow HIDA - Continue IV antibiotics with Zosyn for possible cholecystitis - Appreciate hospitalist and IR recommendations   LOS: 2 days    Lenwood Balsam A Esdras Delair 05/27/2023  Note: Portions of this report may have been transcribed using voice recognition software. Every effort has been made to ensure accuracy; however, inadvertent computerized transcription errors may still be present.

## 2023-05-28 ENCOUNTER — Inpatient Hospital Stay (HOSPITAL_COMMUNITY)

## 2023-05-28 DIAGNOSIS — R109 Unspecified abdominal pain: Secondary | ICD-10-CM

## 2023-05-28 DIAGNOSIS — K810 Acute cholecystitis: Secondary | ICD-10-CM | POA: Diagnosis not present

## 2023-05-28 LAB — BASIC METABOLIC PANEL WITH GFR
Anion gap: 7 (ref 5–15)
BUN: 22 mg/dL (ref 8–23)
CO2: 23 mmol/L (ref 22–32)
Calcium: 8.5 mg/dL — ABNORMAL LOW (ref 8.9–10.3)
Chloride: 109 mmol/L (ref 98–111)
Creatinine, Ser: 0.96 mg/dL (ref 0.44–1.00)
GFR, Estimated: >60 mL/min (ref >60)
Glucose, Bld: 151 mg/dL — ABNORMAL HIGH (ref 70–99)
Potassium: 4.3 mmol/L (ref 3.5–5.1)
Sodium: 139 mmol/L (ref 135–145)

## 2023-05-28 LAB — IRON AND TIBC
Iron: 34 ug/dL (ref 28–170)
Saturation Ratios: 22 % (ref 10.4–31.8)
TIBC: 158 ug/dL — ABNORMAL LOW (ref 250–450)
UIBC: 124 ug/dL

## 2023-05-28 LAB — CBC
HCT: 26.6 % — ABNORMAL LOW (ref 36.0–46.0)
Hemoglobin: 8.4 g/dL — ABNORMAL LOW (ref 12.0–15.0)
MCH: 30.2 pg (ref 26.0–34.0)
MCHC: 31.6 g/dL (ref 30.0–36.0)
MCV: 95.7 fL (ref 80.0–100.0)
Platelets: 139 10*3/uL — ABNORMAL LOW (ref 150–400)
RBC: 2.78 MIL/uL — ABNORMAL LOW (ref 3.87–5.11)
RDW: 13.4 % (ref 11.5–15.5)
WBC: 10.3 10*3/uL (ref 4.0–10.5)
nRBC: 0 % (ref 0.0–0.2)

## 2023-05-28 LAB — MAGNESIUM: Magnesium: 1.8 mg/dL (ref 1.7–2.4)

## 2023-05-28 LAB — TSH: TSH: 0.041 u[IU]/mL — ABNORMAL LOW (ref 0.350–4.500)

## 2023-05-28 MED ORDER — TECHNETIUM TC 99M MEBROFENIN IV KIT
5.0000 | PACK | Freq: Once | INTRAVENOUS | Status: AC | PRN
  Administered 2023-05-28: 5 via INTRAVENOUS

## 2023-05-28 MED ORDER — LORAZEPAM 0.5 MG PO TABS
0.5000 mg | ORAL_TABLET | Freq: Every day | ORAL | Status: DC
  Administered 2023-05-28 – 2023-05-29 (×2): 0.5 mg via ORAL
  Filled 2023-05-28 (×2): qty 1

## 2023-05-28 MED ORDER — LIDOCAINE 5 % EX PTCH
1.0000 | MEDICATED_PATCH | CUTANEOUS | Status: DC
  Administered 2023-05-28 – 2023-05-29 (×2): 1 via TRANSDERMAL
  Filled 2023-05-28 (×4): qty 1

## 2023-05-28 MED ORDER — METOPROLOL TARTRATE 50 MG PO TABS
50.0000 mg | ORAL_TABLET | Freq: Two times a day (BID) | ORAL | Status: DC
  Administered 2023-05-28 – 2023-05-30 (×5): 50 mg via ORAL
  Filled 2023-05-28 (×5): qty 1

## 2023-05-28 MED ORDER — LEVALBUTEROL HCL 1.25 MG/0.5ML IN NEBU
1.2500 mg | INHALATION_SOLUTION | Freq: Two times a day (BID) | RESPIRATORY_TRACT | Status: DC
  Administered 2023-05-28 – 2023-05-29 (×3): 1.25 mg via RESPIRATORY_TRACT
  Filled 2023-05-28 (×3): qty 0.5

## 2023-05-28 MED ORDER — PANTOPRAZOLE SODIUM 40 MG PO TBEC
40.0000 mg | DELAYED_RELEASE_TABLET | Freq: Every day | ORAL | Status: DC
  Administered 2023-05-28 – 2023-05-30 (×3): 40 mg via ORAL
  Filled 2023-05-28 (×3): qty 1

## 2023-05-28 NOTE — Plan of Care (Signed)

## 2023-05-28 NOTE — Progress Notes (Addendum)
 [facility] Progress Note     Subjective: Patient seen and examined.  She is resting comfortably in bed.  She is just now leaving to get her HIDA scan this morning.  She complains of overall not feeling well with some back pain.  She denies any abdominal pain.  Patient's tachycardia was improved overnight.  Objective: Vital signs in last 24 hours: Temp:  [97.5 F (36.4 C)-98.6 F (37 C)] 98 F (36.7 C) (04/08 0807) Pulse Rate:  [79-130] 106 (04/08 0600) Resp:  [10-25] 15 (04/08 0600) BP: (99-154)/(43-94) 149/76 (04/08 0600) SpO2:  [96 %-100 %] 100 % (04/08 0807) Last BM Date : 05/25/23  Intake/Output from previous day: 04/07 0701 - 04/08 0700 In: 1691.6 [P.O.:120; I.V.:872.9; IV Piggyback:698.7] Out: 500 [Urine:500] Intake/Output this shift: No intake/output data recorded.  General appearance: alert, cooperative, and mild distress GI: Abdomen soft, nondistended, no percussion tenderness, nontender to palpation; no rigidity, guarding, or rebound tenderness; negative murphy's sign  Lab Results:  Recent Labs    05/27/23 0407 05/28/23 0512  WBC 10.3 10.3  HGB 7.6* 8.4*  HCT 24.7* 26.6*  PLT 141* 139*   BMET Recent Labs    05/27/23 0407 05/28/23 0512  NA 137 139  K 3.1* 4.3  CL 105 109  CO2 23 23  GLUCOSE 184* 151*  BUN 16 22  CREATININE 0.89 0.96  CALCIUM 8.7* 8.5*   PT/INR No results for input(s): "LABPROT", "INR" in the last 72 hours.  Studies/Results: ECHOCARDIOGRAM COMPLETE Result Date: 05/27/2023    ECHOCARDIOGRAM REPORT   Patient Name:   Linda Woods Val Date of Exam: 05/27/2023 Medical Rec #:  [id number]           Height:       64.0 in Accession #:    [id number]          Weight:       147.0 lb Date of Birth:  1950-09-15          BSA:          1.716 m Patient Age:    72 years            BP:           112/43 mmHg Patient Gender: F                   HR:           122 bpm. [facility] Procedure: 2D Echo, Cardiac Doppler and  Color Doppler (Both Spectral and Color            Flow Doppler were utilized during procedure). Indications:    Atrial tachycardia [facility])  History:        Patient has prior history of Echocardiogram examinations, most                 recent 07/19/2021. COPD; Risk Factors:Hypertension and                 Dyslipidemia. Adenocarcinoma of left lung, stage 3 (HCC).  Sonographer:    Celesta Gentile RCS Referring Phys: [phone] DAVID TAT IMPRESSIONS  1. Left ventricular ejection fraction, by estimation, is 60 to 65%. The left ventricle has normal function. The left ventricle has no regional wall motion abnormalities. Left ventricular diastolic parameters are indeterminate.  2. Right ventricular systolic function is normal. The right ventricular size is normal.  3. Left atrial size was mildly dilated.  4. The mitral valve is normal in structure.  No evidence of mitral valve regurgitation. No evidence of mitral stenosis.  5. The aortic valve is tricuspid. Aortic valve regurgitation is not visualized. No aortic stenosis is present.  6. The inferior vena cava is normal in size with greater than 50% respiratory variability, suggesting right atrial pressure of 3 mmHg. FINDINGS  Left Ventricle: Left ventricular ejection fraction, by estimation, is 60 to 65%. The left ventricle has normal function. The left ventricle has no regional wall motion abnormalities. The left ventricular internal cavity size was normal in size. There is  no left ventricular hypertrophy. Left ventricular diastolic parameters are indeterminate. Right Ventricle: The right ventricular size is normal. Right vetricular wall thickness was not well visualized. Right ventricular systolic function is normal. Left Atrium: Left atrial size was mildly dilated. Right Atrium: Right atrial size was normal in size. Pericardium: There is no evidence of pericardial effusion. Mitral Valve: The mitral valve is normal in structure. No evidence of mitral valve regurgitation. No evidence  of mitral valve stenosis. Tricuspid Valve: The tricuspid valve is normal in structure. Tricuspid valve regurgitation is not demonstrated. No evidence of tricuspid stenosis. Aortic Valve: The aortic valve is tricuspid. Aortic valve regurgitation is not visualized. No aortic stenosis is present. Aortic valve mean gradient measures 4.8 mmHg. Aortic valve peak gradient measures 9.4 mmHg. Aortic valve area, by VTI measures 2.43 cm. Pulmonic Valve: The pulmonic valve was not well visualized. Pulmonic valve regurgitation is not visualized. No evidence of pulmonic stenosis. Aorta: The aortic root is normal in size and structure. Venous: The inferior vena cava is normal in size with greater than 50% respiratory variability, suggesting right atrial pressure of 3 mmHg. IAS/Shunts: No atrial level shunt detected by color flow Doppler.  LEFT VENTRICLE PLAX 2D LVIDd:         5.00 cm LVIDs:         2.50 cm LV PW:         1.00 cm LV IVS:        0.70 cm LVOT diam:     1.90 cm LV SV:         73 LV SV Index:   42 LVOT Area:     2.84 cm  RIGHT VENTRICLE RV S prime:     19.40 cm/s TAPSE (M-mode): 2.3 cm LEFT ATRIUM           Index        RIGHT ATRIUM           Index LA diam:      3.30 cm 1.92 cm/m   RA Area:     16.70 cm LA Vol (A2C): 50.7 ml 29.54 ml/m  RA Volume:   46.30 ml  26.97 ml/m LA Vol (A4C): 62.8 ml 36.59 ml/m  AORTIC VALVE AV Area (Vmax):    2.09 cm AV Area (Vmean):   2.46 cm AV Area (VTI):     2.43 cm AV Vmax:           153.65 cm/s AV Vmean:          101.337 cm/s AV VTI:            0.300 m AV Peak Grad:      9.4 mmHg AV Mean Grad:      4.8 mmHg LVOT Vmax:         113.00 cm/s LVOT Vmean:        88.000 cm/s LVOT VTI:          0.257 m LVOT/AV VTI ratio:  0.86  AORTA Ao Root diam: 3.00 cm MITRAL VALVE MV Area (PHT): 4.68 cm     SHUNTS MV Decel Time: 162 msec     Systemic VTI:  0.26 m MV E velocity: 131.00 cm/s  Systemic Diam: 1.90 cm Dina Rich MD Electronically signed by Dina Rich MD Signature Date/Time:  05/27/2023/3:37:30 PM    Final    US Abdomen Limited RUQ (LIVER/GB) Result Date: 05/27/2023 CLINICAL DATA:  Cholecystitis. EXAM: ULTRASOUND ABDOMEN LIMITED RIGHT UPPER QUADRANT COMPARISON:  CT 05/25/2023 FINDINGS: Gallbladder: Dilated gallbladder. No wall thickening. No adjacent fluid seen on today's examination. There is some layering sludge. No shadowing stones clearly seen at this time. Common bile duct: Diameter: 5 mm Liver: No focal lesion identified. Within normal limits in parenchymal echogenicity. Portal vein is patent on color Doppler imaging with normal direction of blood flow towards the liver. Other: None. IMPRESSION: Dilated gallbladder with some sludge. No shadowing stones or further sonographic evidence of acute cholecystitis. The gallbladder wall thickening and adjacent fluid on the prior CT scan is not well seen on the current ultrasound. Further workup evaluation as clinically appropriate such as HIDA scan or MRI. No biliary ductal dilatation. Electronically Signed   By: Karen Kays M.D.   On: 05/27/2023 12:16    Anti-infectives: Anti-infectives (From admission, onward)    Start     Dose/Rate Route Frequency Ordered Stop   05/26/23 1200  doxycycline (VIBRAMYCIN) 100 mg in sodium chloride 0.9 % 250 mL IVPB        100 mg 125 mL/hr over 120 Minutes Intravenous Every 12 hours 05/26/23 0957     05/26/23 0600  piperacillin-tazobactam (ZOSYN) IVPB 3.375 g        3.375 g 12.5 mL/hr over 240 Minutes Intravenous Every 8 hours 05/26/23 0308     05/25/23 2315  piperacillin-tazobactam (ZOSYN) IVPB 3.375 g        3.375 g 100 mL/hr over 30 Minutes Intravenous  Once 05/25/23 2306 05/26/23 0014       Assessment/Plan:  Patient is a 73 year old female who was admitted with concern for acute cholecystitis and subsequently developed a COPD exacerbation requiring transfer to the ICU.  -HIDA scan ordered by IR to be performed this morning. [name] from yesterday demonstrated a distended gallbladder  with sludge without any stones or other evidence of cholecystitis (the previously seen wall thickening and fluid on CT is not appreciated on ultrasound)   -Will await results of HIDA scan to make determination about next steps of treatment for the patient.  I had previously discussed with the patient that if her HIDA was negative, we could initiate a diet and see how she tolerates this -If her HIDA scan is positive, will potentially need percutaneous cholecystostomy tube with IR versus cholecystectomy pending patient's stability -NPO for HIDA scan -Continue IV zosyn. No leukocytosis this AM -Further recommendations to follow imaging -Appreciate hospitalist recommendations   LOS: 3 days    Beverley Allender A Cordai Rodrigue 05/28/2023  Note: Portions of this report may have been transcribed using voice recognition software. Every effort has been made to ensure accuracy; however, inadvertent computerized transcription errors may still be present.

## 2023-05-28 NOTE — Progress Notes (Addendum)
 PROGRESS NOTE  Linda Woods LKG:[id number] DOB: 1950/10/24 DOA: 05/25/2023 PCP: Assunta Found, MD   Subjective: The patient was seen and examined this morning, s/p HIDA scan Patient reporting no abdominal pain at rest, minimal with exertion Hemodynamically stable-exception of heart rate of 106 this morning   Brief History:  Linda Woods is a  73 year old female with a history of non-small cell lung cancer, hypertension, hyperlipidemia, hypothyroidism, obstructive lung disease/asthma presenting with right-sided abdominal pain that began on the morning of 05/25/2023.  She had complained of nausea but no emesis.  There is no diarrhea, hematochezia, melena.  She denied any chest pain or shortness of breath at the time of admission.  In the ED, the patient was afebrile and hemodynamically stable.  Oxygen saturation was 92% on room air.  WBC 9.6, hemoglobin 10.5, platelets 165.  Sodium 136, potassium 3.9, bicarbonate 23, serum creatinine 1.11.  AST 40, ALT 44, alk phosphatase 129, total bilirubin 0.6.  Lipase 62.   CT of the abdomen and pelvis showed gallbladder wall thickening with pericholecystic edema and distended gallbladder.  Patient was started IV Zosyn.  General surgery was consulted.  The patient was admitted for evaluation of acute cholecystitis  On the morning of 05/26/2023, the patient was short of breath and developed tachycardia with heart rate in the 140s.  She was moved to the stepdown unit for COPD exacerbation and atrial tachycardia.  She was started on bronchodilators, steroids, and diltiazem.   Assessment/Plan:  Acute cholecystitis -Stable, HIDA scan apparently negative -General Surgery consult appreciated>>consult IR for cholecystotomy tube -Continue Zosyn -continue IV fluids -CT AP as discussed above -RUQ US--Dilated gallbladder with some sludge. No shadowing stones or further sonographic evidence of acute cholecystitis -05/28/23>>discussed with IR>>HIDA  reported negative, no need for cholecystectomy tube placement -05/28/23--discussed with Dr. Roselle Locus is neg>> patient can be started on low-fat diet, continue antibiotics   Acute respiratory failure with hypoxia -Stable -secondary to COPD exacerbation -Able on 2 L of oxygen -Start Brovana -Start Pulmicort -Start DuoNebs -Continue IV steroids -check COVID--neg -check viral resp panel -Personally reviewed chest x-ray--no acute infiltrates or edema   COPD exacerbation -Remained stable as above   Atrial tachycardia/MAT -Personally reviewed EKG 4/6>> atrial tachycardia, nonspecific STT wave changes -Start diltiazem drip initially>>stopped due to low BPs -4/7 Echo EF 60-65%, no WMA, normal RVF -now back in sinus -restart metoprolol-titrating up for better heart rate control   Essential hypertension -restart metoprolol   Hypothyroidism -Continue Synthroid -TSH 3.4   NSCLC -Initially diagnosed 09/2019 -s/p 7 cycles paclitaxel and carboplatin -Continue Tagrisso -Follow-up Dr. Shirline Frees   Mixed hyperlipidemia -Continue statin   Status post right TKA -Patient had surgery 04/16/2023 -PT evaluation when stable   Acute on chronic anemia of chronic disease -Monitoring H&H closely    hypokalemia -replete -mag 1.9             Family Communication:   spouse updated at bedside 4/7   Consultants:  general surgery, IR   Code Status:  FULL    DVT Prophylaxis:  SCDs     Procedures: As Listed in Progress Note Above   Antibiotics: Zosyn 4/5>> Doxy 4/6>>   Objective: Vitals:   05/28/23 0406 05/28/23 0500 05/28/23 0600 05/28/23 0807  BP:  (!) 146/94 (!) 149/76   Pulse: 83 98 (!) 106   Resp: 11 13 15    Temp: (!) 97.5 F (36.4 C)   98 F (36.7 C)  TempSrc: Axillary   Oral  SpO2: 100% 99% 100% 100%  Weight:      Height:        Intake/Output Summary (Last 24 hours) at 05/28/2023 1057 Last data filed at 05/28/2023 0600 Gross per 24 hour  Intake 1691.61 ml   Output 500 ml  Net 1191.61 ml   Weight change:  Exam:       General:  AAO x 3,  cooperative, no distress;   HEENT:  Normocephalic, PERRL, otherwise with in Normal limits   Neuro:  CNII-XII intact. , normal motor and sensation, reflexes intact   Lungs:   Clear to auscultation BL, Respirations unlabored,  No wheezes / crackles  Cardio:    S1/S2, RRR, No murmure, No Rubs or Gallops   Abdomen:  Soft, mild right upper quadrant tenderness, bowel sounds active all four quadrants, no guarding or peritoneal signs.  Muscular  skeletal:   Limited exam -global generalized weaknesses - in bed, able to move all extremities,   2+ pulses,  symmetric, No pitting edema  Skin:  Dry, warm to touch, negative for any Rashes,  Wounds: Please see nursing documentation         Data Reviewed: I have personally reviewed following labs and imaging studies Basic Metabolic Panel: Recent Labs  Lab 05/25/23 2204 05/25/23 2209 05/26/23 0441 05/26/23 1631 05/27/23 0407 05/28/23 0512  NA 136 137 135 134* 137 139  K 3.9 3.9 4.0 3.6 3.1* 4.3  CL 101 103 101 101 105 109  CO2 23  --  24 23 23 23   GLUCOSE 149* 149* 134* 202* 184* 151*  BUN 12 10 11 13 16 22   CREATININE 1.11* 1.20* 0.91 1.04* 0.89 0.96  CALCIUM 9.5  --  9.0 8.6* 8.7* 8.5*  MG  --   --  1.7 2.0 1.9 1.8  PHOS  --   --  3.7  --   --   --    Liver Function Tests: Recent Labs  Lab 05/25/23 2204 05/26/23 0441  AST 48* 35  ALT 44 38  ALKPHOS 129* 124  BILITOT 0.6 0.6  PROT 6.9 6.3*  ALBUMIN 3.9 3.6   Recent Labs  Lab 05/25/23 2204  LIPASE 62*    CBC: Recent Labs  Lab 05/25/23 2204 05/25/23 2209 05/26/23 0441 05/27/23 0407 05/28/23 0512  WBC 9.6  --  11.1* 10.3 10.3  HGB 10.5* 11.2* 9.5* 7.6* 8.4*  HCT 33.6* 33.0* 31.0* 24.7* 26.6*  MCV 94.6  --  96.6 95.0 95.7  PLT 165  --  153 141* 139*    Urine analysis:    Component Value Date/Time   COLORURINE YELLOW 05/25/2023 2140   APPEARANCEUR CLEAR 05/25/2023 2140    LABSPEC 1.039 (H) 05/25/2023 2140   PHURINE 5.0 05/25/2023 2140   GLUCOSEU NEGATIVE 05/25/2023 2140   HGBUR NEGATIVE 05/25/2023 2140   BILIRUBINUR NEGATIVE 05/25/2023 2140   BILIRUBINUR n 10/12/2014 1040   KETONESUR NEGATIVE 05/25/2023 2140   PROTEINUR NEGATIVE 05/25/2023 2140   UROBILINOGEN negative 10/12/2014 1040   UROBILINOGEN 0.2 10/14/2009 0900   NITRITE NEGATIVE 05/25/2023 2140   LEUKOCYTESUR NEGATIVE 05/25/2023 2140   Sepsis Labs: @LABRCNTIP (procalcitonin:4,lacticidven:4) ) Recent Results (from the past 240 hours)  Culture, blood (Routine X 2) w Reflex to ID Panel     Status: None (Preliminary result)   Collection Time: 05/26/23  4:29 AM   Specimen: Left Antecubital; Blood  Result Value Ref Range Status   Specimen Description LEFT ANTECUBITAL  Final   Special  Requests BOTTLES DRAWN AEROBIC AND ANAEROBIC  Final   Culture   Final    NO GROWTH 2 DAYS Performed at [facility], [address], [place], [place] [address]    Report Status PENDING  Incomplete  Culture, blood (Routine X 2) w Reflex to ID Panel     Status: None (Preliminary result)   Collection Time: 05/26/23  4:41 AM   Specimen: BLOOD LEFT HAND  Result Value Ref Range Status   Specimen Description BLOOD LEFT HAND  Final   Special Requests BOTTLES DRAWN AEROBIC AND ANAEROBIC  Final   Culture   Final    NO GROWTH 2 DAYS Performed at [facility], [address] [place], [place] [address]    Report Status PENDING  Incomplete  Resp panel by RT-PCR (RSV, Flu A&B, Covid) Anterior Nasal Swab     Status: None   Collection Time: 05/26/23  8:34 AM   Specimen: Anterior Nasal Swab  Result Value Ref Range Status   SARS Coronavirus 2 by RT PCR NEGATIVE NEGATIVE Final    Comment: (NOTE) SARS-CoV-2 target nucleic acids are NOT DETECTED.  The SARS-CoV-2 RNA is generally detectable in upper respiratory specimens during the acute phase of infection. The lowest concentration of SARS-CoV-2 viral copies this assay can  detect is 138 copies/mL. A negative result does not preclude SARS-Cov-2 infection and should not be used as the sole basis for treatment or other patient management decisions. A negative result may occur with  improper specimen collection/handling, submission of specimen other than nasopharyngeal swab, presence of viral mutation(s) within the areas targeted by this assay, and inadequate number of viral copies(<138 copies/mL). A negative result must be combined with clinical observations, patient history, and epidemiological information. The expected result is Negative.  Fact Sheet for Patients:  BloggerCourse.com  Fact Sheet for Healthcare Providers:  SeriousBroker.it  This test is no t yet approved or cleared by the [place] FDA and  has been authorized for detection and/or diagnosis of SARS-CoV-2 by FDA under an Emergency Use Authorization (EUA). This EUA will remain  in effect (meaning this test can be used) for the duration of the COVID-19 declaration under Section 564(b)(1) of the Act, 21 U.S.C.section 360bbb-3(b)(1), unless the authorization is terminated  or revoked sooner.       Influenza A by PCR NEGATIVE NEGATIVE Final   Influenza B by PCR NEGATIVE NEGATIVE Final    Comment: (NOTE) The Xpert Xpress SARS-CoV-2/FLU/RSV plus assay is intended as an aid in the diagnosis of influenza from Nasopharyngeal swab specimens and should not be used as a sole basis for treatment. Nasal washings and aspirates are unacceptable for Xpert Xpress SARS-CoV-2/FLU/RSV testing.  Fact Sheet for Patients: BloggerCourse.com  Fact Sheet for Healthcare Providers: SeriousBroker.it  This test is not yet approved or cleared by the [place] FDA and has been authorized for detection and/or diagnosis of SARS-CoV-2 by FDA under an Emergency Use Authorization (EUA). This EUA will remain in  effect (meaning this test can be used) for the duration of the COVID-19 declaration under Section 564(b)(1) of the Act, 21 U.S.C. section 360bbb-3(b)(1), unless the authorization is terminated or revoked.     Resp Syncytial Virus by PCR NEGATIVE NEGATIVE Final    Comment: (NOTE) Fact Sheet for Patients: BloggerCourse.com  Fact Sheet for Healthcare Providers: SeriousBroker.it  This test is not yet approved or cleared by the [place] FDA and has been authorized for detection and/or diagnosis of SARS-CoV-2 by FDA under an Emergency Use Authorization (  EUA). This EUA will remain in effect (meaning this test can be used) for the duration of the COVID-19 declaration under Section 564(b)(1) of the Act, 21 U.S.C. section 360bbb-3(b)(1), unless the authorization is terminated or revoked.  Performed at [facility], [address] [place], [place] [address]   MRSA Next Gen by PCR, Nasal     Status: None   Collection Time: 05/26/23 10:17 AM   Specimen: Nasal Mucosa; Nasal Swab  Result Value Ref Range Status   MRSA by PCR Next Gen NOT DETECTED NOT DETECTED Final    Comment: (NOTE) The GeneXpert MRSA Assay (FDA approved for NASAL specimens only), is one component of a comprehensive MRSA colonization surveillance program. It is not intended to diagnose MRSA infection nor to guide or monitor treatment for MRSA infections. Test performance is not FDA approved in patients less than 91 years old. Performed at [facility], [address] [place], [place] [address]      Scheduled Meds:  acetaminophen  1,000 mg Oral Q6H   arformoterol  15 mcg Nebulization BID   benzonatate  100 mg Oral BID   budesonide (PULMICORT) nebulizer solution  0.5 mg Nebulization BID   Chlorhexidine Gluconate Cloth  6 each Topical Daily   levalbuterol  1.25 mg Nebulization BID   levothyroxine  100 mcg Oral Daily   melatonin  6 mg Oral QHS   methylPREDNISolone  (SOLU-MEDROL) injection  60 mg Intravenous Q12H   metoprolol tartrate  50 mg Oral BID   osimertinib mesylate  80 mg Oral Daily   pantoprazole  40 mg Oral Daily   rosuvastatin  5 mg Oral Daily   Continuous Infusions:  0.9 % NaCl with KCl 20 mEq / L 75 mL/hr at 05/28/23 1007   diltiazem (CARDIZEM) infusion Stopped (05/26/23 1130)   doxycycline (VIBRAMYCIN) IV 100 mg (05/28/23 0124)   piperacillin-tazobactam (ZOSYN)  IV 3.375 g (05/28/23 0626)    Procedures/Studies: ECHOCARDIOGRAM COMPLETE Result Date: 05/27/2023    ECHOCARDIOGRAM REPORT  . IMPRESSION: Dilated gallbladder with some sludge. No shadowing stones or further sonographic evidence of acute cholecystitis. The gallbladder wall thickening and adjacent fluid on the prior CT scan is not well seen on the current ultrasound. Further workup evaluation as clinically appropriate such as HIDA scan or MRI. No biliary ductal dilatation. Electronically Signed   By: Karen Kays M.D.   On: 05/27/2023 12:16   CT Soft Tissue Neck W Contrast Result Date: 05/26/2023 MPRESSION: 1. No acute or metastatic finding identified in the Neck. No neck mass is identified. 2. Visible upper Chest stable to PET-CT 03/01/2023. Electronically Signed   By: Odessa Fleming M.D.   On: 05/26/2023 11:30   DG CHEST PORT 1 VIEW Result Date: 05/26/2023 . IMPRESSION: Mildly lower lung volumes with otherwise stable post treatment appearance of the chest since December. Electronically Signed   By: Odessa Fleming M.D.   On: 05/26/2023 11:21   CT ABDOMEN PELVIS W CONTRAST Result Date: 05/25/2023  IMPRESSION: 1. Acute cholecystitis. 2. Moderate hiatal hernia. Fluid-filled distal esophagus suggesting changes of gastroesophageal reflux. 3. Moderate distal colonic diverticulosis without superimposed acute inflammatory change. 4. Stable sclerotic lesions within the spine at T11, L5, and S2 compatible with treated metastatic disease. Aortic Atherosclerosis (ICD10-I70.0). Electronically Signed   By: Helyn Numbers M.D.   On: 05/25/2023 23:00     SIGNED: Kendell Bane, MD, FHM. FAAFP Critical care time spent 55 minutes -in seeing evaluating patient, reviewing records, imaging, labs, discussing with consultants, [facility],  Pager (please use [email or web address] to page/text)  Please use Epic Secure Chat for non-urgent communication (7AM-7PM) If 7PM-7AM, please contact night-coverage [email or web address],  05/28/2023, 10:57 AM    LOS: 3 days

## 2023-05-28 NOTE — Progress Notes (Signed)
 IR following case for possible perc chole HIDA scan ordered and performed today. Final report not up yet but reviewed with Dr. Elby Showers. Scan shows visualization of gallbladder and patent ducts. Perc chole not indicated.  Brayton El PA-C Interventional Radiology 05/28/2023 11:57 AM

## 2023-05-28 NOTE — Plan of Care (Signed)

## 2023-05-29 ENCOUNTER — Encounter: Payer: Self-pay | Admitting: Internal Medicine

## 2023-05-29 DIAGNOSIS — R109 Unspecified abdominal pain: Secondary | ICD-10-CM | POA: Diagnosis not present

## 2023-05-29 DIAGNOSIS — K810 Acute cholecystitis: Secondary | ICD-10-CM | POA: Diagnosis not present

## 2023-05-29 LAB — CBC
HCT: 28.2 % — ABNORMAL LOW (ref 36.0–46.0)
Hemoglobin: 8.6 g/dL — ABNORMAL LOW (ref 12.0–15.0)
MCH: 29.6 pg (ref 26.0–34.0)
MCHC: 30.5 g/dL (ref 30.0–36.0)
MCV: 96.9 fL (ref 80.0–100.0)
Platelets: 154 10*3/uL (ref 150–400)
RBC: 2.91 MIL/uL — ABNORMAL LOW (ref 3.87–5.11)
RDW: 13.4 % (ref 11.5–15.5)
WBC: 6.5 10*3/uL (ref 4.0–10.5)
nRBC: 0 % (ref 0.0–0.2)

## 2023-05-29 LAB — IRON AND TIBC
Iron: 56 ug/dL (ref 28–170)
Saturation Ratios: 33 % — ABNORMAL HIGH (ref 10.4–31.8)
TIBC: 171 ug/dL — ABNORMAL LOW (ref 250–450)
UIBC: 115 ug/dL

## 2023-05-29 LAB — BASIC METABOLIC PANEL WITH GFR
Anion gap: 8 (ref 5–15)
BUN: 32 mg/dL — ABNORMAL HIGH (ref 8–23)
CO2: 21 mmol/L — ABNORMAL LOW (ref 22–32)
Calcium: 8.6 mg/dL — ABNORMAL LOW (ref 8.9–10.3)
Chloride: 110 mmol/L (ref 98–111)
Creatinine, Ser: 0.96 mg/dL (ref 0.44–1.00)
GFR, Estimated: >60 mL/min (ref >60)
Glucose, Bld: 150 mg/dL — ABNORMAL HIGH (ref 70–99)
Potassium: 4.4 mmol/L (ref 3.5–5.1)
Sodium: 139 mmol/L (ref 135–145)

## 2023-05-29 LAB — T4, FREE: Free T4: 1.36 ng/dL — ABNORMAL HIGH (ref 0.61–1.12)

## 2023-05-29 MED ORDER — METHYLPREDNISOLONE SODIUM SUCC 40 MG IJ SOLR
40.0000 mg | Freq: Two times a day (BID) | INTRAMUSCULAR | Status: DC
  Administered 2023-05-29: 40 mg via INTRAVENOUS
  Filled 2023-05-29: qty 1

## 2023-05-29 MED ORDER — IPRATROPIUM-ALBUTEROL 0.5-2.5 (3) MG/3ML IN SOLN: 3.0000 mL | Freq: Four times a day (QID) | RESPIRATORY_TRACT | Status: DC | PRN

## 2023-05-29 MED ORDER — LEVALBUTEROL HCL 1.25 MG/0.5ML IN NEBU
1.2500 mg | INHALATION_SOLUTION | Freq: Four times a day (QID) | RESPIRATORY_TRACT | Status: DC
  Administered 2023-05-29 (×2): 1.25 mg via RESPIRATORY_TRACT
  Filled 2023-05-29 (×2): qty 0.5

## 2023-05-29 MED ORDER — LEVALBUTEROL HCL 1.25 MG/0.5ML IN NEBU
1.2500 mg | INHALATION_SOLUTION | Freq: Two times a day (BID) | RESPIRATORY_TRACT | Status: DC
  Administered 2023-05-30: 1.25 mg via RESPIRATORY_TRACT
  Filled 2023-05-29: qty 0.5

## 2023-05-29 MED ORDER — DOXYCYCLINE HYCLATE 100 MG PO TABS
100.0000 mg | ORAL_TABLET | Freq: Two times a day (BID) | ORAL | Status: DC
  Administered 2023-05-29 – 2023-05-30 (×3): 100 mg via ORAL
  Filled 2023-05-29 (×3): qty 1

## 2023-05-29 MED ORDER — ROSUVASTATIN CALCIUM 10 MG PO TABS
5.0000 mg | ORAL_TABLET | Freq: Every day | ORAL | Status: DC
Start: 2023-05-31 — End: 2023-05-30

## 2023-05-29 MED ORDER — METHYLPREDNISOLONE SODIUM SUCC 40 MG IJ SOLR
40.0000 mg | INTRAMUSCULAR | Status: DC
  Administered 2023-05-30: 40 mg via INTRAVENOUS
  Filled 2023-05-29: qty 1

## 2023-05-29 NOTE — Progress Notes (Signed)
 [facility] Progress Note     Subjective: Patient seen and examined.  She is resting comfortably in bed.  She denies abdominal pain but complains of some back pain.  She is tolerating some of her diet, but does have some nausea with certain foods, specifically meat.  She has no other complaints at this time.  Objective: Vital signs in last 24 hours: Temp:  [97.8 F (36.6 C)-98.2 F (36.8 C)] 97.9 F (36.6 C) (04/09 0746) Pulse Rate:  [85-106] 106 (04/09 0800) Resp:  [11-21] 19 (04/09 0300) BP: (114-147)/(69-90) 144/82 (04/09 0800) SpO2:  [94 %-100 %] 100 % (04/09 0834) Last BM Date : 05/28/23  Intake/Output from previous day: No intake/output data recorded. Intake/Output this shift: Total I/O In: -  Out: 600 [Urine:600]  General appearance: alert, cooperative, and no distress GI: Abdomen soft, nondistended, no percussion tenderness, nontender to palpation; no rigidity, guarding, rebound tenderness; negative Murphy sign  Lab Results:  Recent Labs    05/28/23 0512 05/29/23 0449  WBC 10.3 6.5  HGB 8.4* 8.6*  HCT 26.6* 28.2*  PLT 139* 154   BMET Recent Labs    05/28/23 0512 05/29/23 0449  NA 139 139  K 4.3 4.4  CL 109 110  CO2 23 21*  GLUCOSE 151* 150*  BUN 22 32*  CREATININE 0.96 0.96  CALCIUM 8.5* 8.6*   PT/INR No results for input(s): "LABPROT", "INR" in the last 72 hours.  Studies/Results: NM Hepatobiliary Liver Func Result Date: 05/28/2023 CLINICAL DATA:  Abdominal pain, concern for cholecystitis EXAM: NUCLEAR MEDICINE HEPATOBILIARY IMAGING TECHNIQUE: Sequential images of the abdomen were obtained out to 60 minutes following intravenous administration of radiopharmaceutical. RADIOPHARMACEUTICALS:  5.0 mCi Tc-84m  Choletec IV COMPARISON:  None Available. FINDINGS: Prompt uptake and biliary excretion of activity by the liver is seen. Gallbladder activity is visualized, consistent with patency of cystic duct. Biliary activity passes into small  bowel, consistent with patent common bile duct. IMPRESSION: Normal hepatobiliary scan. Electronically Signed   By: Judie Petit.  Shick M.D.   On: 05/28/2023 12:07   ECHOCARDIOGRAM COMPLETE Result Date: 05/27/2023    ECHOCARDIOGRAM REPORT   Patient Name:   Linda Woods Date of Exam: 05/27/2023 Medical Rec #:  [id number]           Height:       64.0 in Accession #:    [id number]          Weight:       147.0 lb Date of Birth:  1950-04-11          BSA:          1.716 m Patient Age:    72 years            BP:           112/43 mmHg Patient Gender: F                   HR:           122 bpm. [facility] Procedure: 2D Echo, Cardiac Doppler and Color Doppler (Both Spectral and Color            Flow Doppler were utilized during procedure). Indications:    Atrial tachycardia [facility])  History:        Patient has prior history of Echocardiogram examinations, most                 recent 07/19/2021. COPD; Risk Factors:Hypertension and  Dyslipidemia. Adenocarcinoma of left lung, stage 3 (HCC).  Sonographer:    Celesta Gentile RCS Referring Phys: [phone] DAVID TAT IMPRESSIONS  1. Left ventricular ejection fraction, by estimation, is 60 to 65%. The left ventricle has normal function. The left ventricle has no regional wall motion abnormalities. Left ventricular diastolic parameters are indeterminate.  2. Right ventricular systolic function is normal. The right ventricular size is normal.  3. Left atrial size was mildly dilated.  4. The mitral valve is normal in structure. No evidence of mitral valve regurgitation. No evidence of mitral stenosis.  5. The aortic valve is tricuspid. Aortic valve regurgitation is not visualized. No aortic stenosis is present.  6. The inferior vena cava is normal in size with greater than 50% respiratory variability, suggesting right atrial pressure of 3 mmHg. FINDINGS  Left Ventricle: Left ventricular ejection fraction, by estimation, is 60 to 65%. The left ventricle has normal  function. The left ventricle has no regional wall motion abnormalities. The left ventricular internal cavity size was normal in size. There is  no left ventricular hypertrophy. Left ventricular diastolic parameters are indeterminate. Right Ventricle: The right ventricular size is normal. Right vetricular wall thickness was not well visualized. Right ventricular systolic function is normal. Left Atrium: Left atrial size was mildly dilated. Right Atrium: Right atrial size was normal in size. Pericardium: There is no evidence of pericardial effusion. Mitral Valve: The mitral valve is normal in structure. No evidence of mitral valve regurgitation. No evidence of mitral valve stenosis. Tricuspid Valve: The tricuspid valve is normal in structure. Tricuspid valve regurgitation is not demonstrated. No evidence of tricuspid stenosis. Aortic Valve: The aortic valve is tricuspid. Aortic valve regurgitation is not visualized. No aortic stenosis is present. Aortic valve mean gradient measures 4.8 mmHg. Aortic valve peak gradient measures 9.4 mmHg. Aortic valve area, by VTI measures 2.43 cm. Pulmonic Valve: The pulmonic valve was not well visualized. Pulmonic valve regurgitation is not visualized. No evidence of pulmonic stenosis. Aorta: The aortic root is normal in size and structure. Venous: The inferior vena cava is normal in size with greater than 50% respiratory variability, suggesting right atrial pressure of 3 mmHg. IAS/Shunts: No atrial level shunt detected by color flow Doppler.  LEFT VENTRICLE PLAX 2D LVIDd:         5.00 cm LVIDs:         2.50 cm LV PW:         1.00 cm LV IVS:        0.70 cm LVOT diam:     1.90 cm LV SV:         73 LV SV Index:   42 LVOT Area:     2.84 cm  RIGHT VENTRICLE RV S prime:     19.40 cm/s TAPSE (M-mode): 2.3 cm LEFT ATRIUM           Index        RIGHT ATRIUM           Index LA diam:      3.30 cm 1.92 cm/m   RA Area:     16.70 cm LA Vol (A2C): 50.7 ml 29.54 ml/m  RA Volume:   46.30 ml   26.97 ml/m LA Vol (A4C): 62.8 ml 36.59 ml/m  AORTIC VALVE AV Area (Vmax):    2.09 cm AV Area (Vmean):   2.46 cm AV Area (VTI):     2.43 cm AV Vmax:           153.65 cm/s  AV Vmean:          101.337 cm/s AV VTI:            0.300 m AV Peak Grad:      9.4 mmHg AV Mean Grad:      4.8 mmHg LVOT Vmax:         113.00 cm/s LVOT Vmean:        88.000 cm/s LVOT VTI:          0.257 m LVOT/AV VTI ratio: 0.86  AORTA Ao Root diam: 3.00 cm MITRAL VALVE MV Area (PHT): 4.68 cm     SHUNTS MV Decel Time: 162 msec     Systemic VTI:  0.26 m MV E velocity: 131.00 cm/s  Systemic Diam: 1.90 cm Dina Rich MD Electronically signed by Dina Rich MD Signature Date/Time: 05/27/2023/3:37:30 PM    Final     Anti-infectives: Anti-infectives (From admission, onward)    Start     Dose/Rate Route Frequency Ordered Stop   05/29/23 1000  doxycycline (VIBRA-TABS) tablet 100 mg        100 mg Oral Every 12 hours 05/29/23 0716     05/26/23 1200  doxycycline (VIBRAMYCIN) 100 mg in sodium chloride 0.9 % 250 mL IVPB  Status:  Discontinued        100 mg 125 mL/hr over 120 Minutes Intravenous Every 12 hours 05/26/23 0957 05/29/23 0716   05/26/23 0600  piperacillin-tazobactam (ZOSYN) IVPB 3.375 g        3.375 g 12.5 mL/hr over 240 Minutes Intravenous Every 8 hours 05/26/23 0308     05/25/23 2315  piperacillin-tazobactam (ZOSYN) IVPB 3.375 g        3.375 g 100 mL/hr over 30 Minutes Intravenous  Once 05/25/23 2306 05/26/23 0014       Assessment/Plan:  Patient is a 73 year old female who was admitted with concern for acute cholecystitis and subsequently developed COPD exacerbation.  -HIDA scan performed yesterday demonstrated filling of the gallbladder with patent biliary ductal system, suggesting against acute cholecystitis.  Patient's ultrasound from 4/7 is also suggesting against acute cholecystitis -Continue low-fat diet -Would continue a total of 7 days of antibiotic treatment -No leukocytosis -No need for urgent  cholecystectomy at this time -Patient stable for discharge from surgical standpoint once she is tolerating a diet and pain is controlled -Recommend she follow-up with me outpatient to discuss need for elective cholecystectomy   LOS: 4 days    Linda Woods 05/29/2023  Note: Portions of this report may have been transcribed using voice recognition software. Every effort has been made to ensure accuracy; however, inadvertent computerized transcription errors may still be present.

## 2023-05-29 NOTE — Evaluation (Signed)
 Physical Therapy Evaluation Patient Details Name: Linda Woods MRN: [id number] DOB: 1950-09-24 Today's Date: 05/29/2023  History of Present Illness  a  73 year old female with a history of non-small cell lung cancer, hypertension, hyperlipidemia, hypothyroidism, obstructive lung disease/asthma presenting with right-sided abdominal pain that began on the morning of 05/25/2023.  She had complained of nausea but no emesis.  There is no diarrhea, hematochezia, melena.  She denied any chest pain or shortness of breath at the time of admission.  Clinical Impression   Pt tolerated today's Physical Therapy Evaluation, well. Pt with recent R TKA, ROM is [facility] for acute concerns. Pt's baseline is independent with ambulation recently to cane, and independent with ADLs. Currently, pt demonstrating CGA<> supervision for transfers and mod I for ambulation with RW. Pt with slight decline in function due to muscle weakness, deconditioning, in setting of lung cancer.   Based upon these deficits/impairments, patient will benefit from continued skilled physical therapy services during remainder of hospital stay and at the next recommended venue of care to address deficits and promote return to optimal function.                If plan is discharge home, recommend the following: A little help with walking and/or transfers;A little help with bathing/dressing/bathroom   Can travel by private vehicle        Equipment Recommendations None recommended by PT  Recommendations for Other Services       Functional Status Assessment Patient has had a recent decline in their functional status and demonstrates the ability to make significant improvements in function in a reasonable and predictable amount of time.     Precautions / Restrictions Precautions Precautions: None Restrictions Weight Bearing Restrictions Per Provider Order: No      Mobility  Bed Mobility Overal bed mobility: Needs Assistance Bed  Mobility: Supine to Sit, Sit to Supine     Supine to sit: Supervision Sit to supine: Supervision   General bed mobility comments: slow, labored with HOB elevated- pt's friend assist with bed mobility and donning underwear per pt request. Patient Response: Cooperative  Transfers Overall transfer level: Needs assistance   Transfers: Sit to/from Stand Sit to Stand: Contact guard assist           General transfer comment: from EOB with sitting BP: 151/104; standing BP 151/93 HR 118-120s    Ambulation/Gait Ambulation/Gait assistance: Modified independent (Device/Increase time) Gait Distance (Feet): 24 Feet Assistive device: Rolling walker (2 wheels) Gait Pattern/deviations: WFL(Within Functional Limits)       General Gait Details: WFL for ambulation with RW. Steady, no LOB. BP after ambulation 128/94 HR 105  Stairs            Wheelchair Mobility     Tilt Bed Tilt Bed Patient Response: Cooperative  Modified Rankin (Stroke Patients Only)       Balance Overall balance assessment: Mild deficits observed, not formally tested                                           Pertinent Vitals/Pain Pain Assessment Pain Assessment: No/denies pain Pain Score: 5  Pain Location: Lower left back Pain Descriptors / Indicators: Dull, Sharp Pain Intervention(s): Monitored during session    Home Living Family/patient expects to be discharged to:: Private residence Living Arrangements: Spouse/significant other Available Help at Discharge: Family Type of Home: House Home Access: Stairs  to enter Entrance Stairs-Rails: None Entrance Stairs-Number of Steps: 1 + 1   Home Layout: Multi-level;Able to live on main level with bedroom/bathroom Home Equipment: Rolling Walker (2 wheels);Cane - single point;Cane - quad;Shower seat - built in      Prior Function Prior Level of Function : Independent/Modified Independent             Mobility Comments: IND no AD  for all ADLs, self care tasks and IADLs ADLs Comments: Independent, pt has aide for cleaning, aside from other ADLs independent.     Extremity/Trunk Assessment   Upper Extremity Assessment Upper Extremity Assessment: Defer to OT evaluation    Lower Extremity Assessment Lower Extremity Assessment: Overall WFL for tasks assessed    Cervical / Trunk Assessment Cervical / Trunk Assessment: Kyphotic  Communication   Communication Communication: No apparent difficulties    Cognition Arousal: Alert Behavior During Therapy: WFL for tasks assessed/performed                             Following commands: Intact       Cueing Cueing Techniques: Verbal cues     General Comments      Exercises     Assessment/Plan    PT Assessment Patient needs continued PT services  PT Problem List Decreased strength;Decreased activity tolerance;Decreased balance;Decreased mobility       PT Treatment Interventions Gait training;Functional mobility training;Therapeutic activities;Therapeutic exercise;Balance training;Neuromuscular re-education    PT Goals (Current goals can be found in the Care Plan section)  Acute Rehab PT Goals Patient Stated Goal: return home PT Goal Formulation: With patient Time For Goal Achievement: 06/12/23 Potential to Achieve Goals: Good    Frequency Min 3X/week     Co-evaluation               AM-PAC PT "6 Clicks" Mobility  Outcome Measure Help needed turning from your back to your side while in a flat bed without using bedrails?: A Little Help needed moving from lying on your back to sitting on the side of a flat bed without using bedrails?: A Little Help needed moving to and from a bed to a chair (including a wheelchair)?: A Little Help needed standing up from a chair using your arms (e.g., wheelchair or bedside chair)?: A Little Help needed to walk in hospital room?: A Little Help needed climbing 3-5 steps with a railing? : A Lot 6  Click Score: 17    End of Session Equipment Utilized During Treatment: Gait belt Activity Tolerance: Patient tolerated treatment well Patient left: in bed;with call bell/phone within reach;with bed alarm set;with family/visitor present Nurse Communication: Mobility status PT Visit Diagnosis: Unsteadiness on feet (R26.81);Other abnormalities of gait and mobility (R26.89);Muscle weakness (generalized) (M62.81)    Time: 9604-5409 PT Time Calculation (min) (ACUTE ONLY): 26 min   Charges:   PT Evaluation $PT Eval Low Complexity: 1 Low PT Treatments $Therapeutic Activity: 8-22 mins PT General Charges $$ ACUTE PT VISIT: 1 Visit         Elie Goody, DPT [facility]- [place] 336 [phone] office  Linda Woods 05/29/2023, 3:57 PM

## 2023-05-29 NOTE — Plan of Care (Signed)

## 2023-05-29 NOTE — Progress Notes (Signed)
 PROGRESS NOTE  Linda Woods WUJ:[id number] DOB: 19-Dec-1950 DOA: 05/25/2023 PCP: Assunta Found, MD   Subjective: The patient was seen and examined this morning, stable, tolerating her diet but not much of appetite Eating very small amount -Still complaining of right upper quadrant pain especially with palpation Hemodynamically stable   Brief History:  Linda Woods is a  73 year old female with a history of non-small cell lung cancer, hypertension, hyperlipidemia, hypothyroidism, obstructive lung disease/asthma presenting with right-sided abdominal pain that began on the morning of 05/25/2023.  She had complained of nausea but no emesis.  There is no diarrhea, hematochezia, melena.  She denied any chest pain or shortness of breath at the time of admission.  In the ED, the patient was afebrile and hemodynamically stable.  Oxygen saturation was 92% on room air.  WBC 9.6, hemoglobin 10.5, platelets 165.  Sodium 136, potassium 3.9, bicarbonate 23, serum creatinine 1.11.  AST 40, ALT 44, alk phosphatase 129, total bilirubin 0.6.  Lipase 62.   CT of the abdomen and pelvis showed gallbladder wall thickening with pericholecystic edema and distended gallbladder.  Patient was started IV Zosyn.  General surgery was consulted.  The patient was admitted for evaluation of acute cholecystitis  On the morning of 05/26/2023, the patient was short of breath and developed tachycardia with heart rate in the 140s.  She was moved to the stepdown unit for COPD exacerbation and atrial tachycardia.  She was started on bronchodilators, steroids, and diltiazem.   Assessment/Plan:  Acute cholecystitis -Hemodynamically stable -HIDA scan apparently negative per  IR  -General Surgery consult appreciated>>consult IR for cholecystotomy tube -Continue Zosyn, reducing IVF as tolerating p.o.  -CT AP as discussed above -RUQ US--Dilated gallbladder with some sludge. No shadowing stones or further  sonographic evidence of acute cholecystitis -05/28/23>>discussed with IR>>HIDA reported negative, no need for cholecystectomy tube placement -05/28/23--discussed with Dr. Roselle Locus is neg>> patient can be started on low-fat diet, continue antibiotics    05/29/2023, patient is stable with low -to moderate risk for surgery - especially for laparoscopic cholecystectomy  Acute respiratory failure with hypoxia -Satting 100% on room air -secondary to COPD exacerbation  -Start Brovana -Start Pulmicort -Start DuoNebs -Tapering down steroids -check COVID--neg -check viral resp panel-negative -Chest x-ray--no acute infiltrates or edema   COPD exacerbation -Exacerbation-resolved   Atrial tachycardia/MAT -Personally reviewed EKG 4/6>> atrial tachycardia, nonspecific STT wave changes -Start diltiazem drip initially>>stopped due to low BPs -4/7 Echo EF 60-65%, no WMA, normal RVF -now back in sinus -restart metoprolol-titrating up for better heart rate control   Essential hypertension -stable on metoprolol   Hypothyroidism -Continue Synthroid -TSH 3.4   NSCLC -Initially diagnosed 09/2019 -s/p 7 cycles paclitaxel and carboplatin -Continue Tagrisso -Follow-up Dr. Shirline Frees   Mixed hyperlipidemia -Continue statin   Status post right TKA -Patient had surgery 04/16/2023 -PT evaluation when stable   Acute on chronic anemia of chronic disease -Monitoring H&H closely -currently stable around 8.6 -  Hypokalemia -Monitoring and repleting             Family Communication:   spouse updated at bedside 4/8   Consultants:  general surgery, IR   Code Status:  FULL    DVT Prophylaxis:  SCDs     Procedures: As Listed in Progress Note Above   Antibiotics: Zosyn 4/5>> Doxy 4/6>>   Objective: Vitals:   05/29/23 0800 05/29/23 0823 05/29/23 0826 05/29/23 0834  BP: (!) 144/82  Pulse: (!) 106     Resp:      Temp:      TempSrc:      SpO2: 98% 98% 100% 100%  Weight:       Height:        Intake/Output Summary (Last 24 hours) at 05/29/2023 1119 Last data filed at 05/29/2023 0746 Gross per 24 hour  Intake --  Output 600 ml  Net -600 ml   Weight change:  Exam:       General:  AAO x 3,  cooperative, no distress;   HEENT:  Normocephalic, PERRL, otherwise with in Normal limits   Neuro:  CNII-XII intact. , normal motor and sensation, reflexes intact   Lungs:   Clear to auscultation BL, Respirations unlabored,  No wheezes / crackles  Cardio:    S1/S2, RRR, No murmure, No Rubs or Gallops   Abdomen:  Soft, mild right upper quadrant tenderness, bowel sounds active all four quadrants, no guarding or peritoneal signs.  Muscular  skeletal:  Limited exam -global generalized weaknesses - in bed, able to move all 4 extremities,   2+ pulses,  symmetric, No pitting edema  Skin:  Dry, warm to touch, negative for any Rashes,  Wounds: Please see nursing documentation          Data Reviewed: I have personally reviewed following labs and imaging studies Basic Metabolic Panel: Recent Labs  Lab 05/26/23 0441 05/26/23 1631 05/27/23 0407 05/28/23 0512 05/29/23 0449  NA 135 134* 137 139 139  K 4.0 3.6 3.1* 4.3 4.4  CL 101 101 105 109 110  CO2 24 23 23 23  21*  GLUCOSE 134* 202* 184* 151* 150*  BUN 11 13 16 22  32*  CREATININE 0.91 1.04* 0.89 0.96 0.96  CALCIUM 9.0 8.6* 8.7* 8.5* 8.6*  MG 1.7 2.0 1.9 1.8  --   PHOS 3.7  --   --   --   --    Liver Function Tests: Recent Labs  Lab 05/25/23 2204 05/26/23 0441  AST 48* 35  ALT 44 38  ALKPHOS 129* 124  BILITOT 0.6 0.6  PROT 6.9 6.3*  ALBUMIN 3.9 3.6   Recent Labs  Lab 05/25/23 2204  LIPASE 62*    CBC: Recent Labs  Lab 05/25/23 2204 05/25/23 2209 05/26/23 0441 05/27/23 0407 05/28/23 0512 05/29/23 0449  WBC 9.6  --  11.1* 10.3 10.3 6.5  HGB 10.5* 11.2* 9.5* 7.6* 8.4* 8.6*  HCT 33.6* 33.0* 31.0* 24.7* 26.6* 28.2*  MCV 94.6  --  96.6 95.0 95.7 96.9  PLT 165  --  153 141* 139* 154     Urine analysis:    Component Value Date/Time   COLORURINE YELLOW 05/25/2023 2140   APPEARANCEUR CLEAR 05/25/2023 2140   LABSPEC 1.039 (H) 05/25/2023 2140   PHURINE 5.0 05/25/2023 2140   GLUCOSEU NEGATIVE 05/25/2023 2140   HGBUR NEGATIVE 05/25/2023 2140   BILIRUBINUR NEGATIVE 05/25/2023 2140   BILIRUBINUR n 10/12/2014 1040   KETONESUR NEGATIVE 05/25/2023 2140   PROTEINUR NEGATIVE 05/25/2023 2140   UROBILINOGEN negative 10/12/2014 1040   UROBILINOGEN 0.2 10/14/2009 0900   NITRITE NEGATIVE 05/25/2023 2140   LEUKOCYTESUR NEGATIVE 05/25/2023 2140   Sepsis Labs: @LABRCNTIP (procalcitonin:4,lacticidven:4) ) Recent Results (from the past 240 hours)  Culture, blood (Routine X 2) w Reflex to ID Panel     Status: None (Preliminary result)   Collection Time: 05/26/23  4:29 AM   Specimen: Left Antecubital; Blood  Result Value Ref Range Status   Specimen Description LEFT  ANTECUBITAL  Final   Special Requests BOTTLES DRAWN AEROBIC AND ANAEROBIC  Final   Culture   Final    NO GROWTH 3 DAYS Performed at [facility], [address], [place], [place] [address]    Report Status PENDING  Incomplete  Culture, blood (Routine X 2) w Reflex to ID Panel     Status: None (Preliminary result)   Collection Time: 05/26/23  4:41 AM   Specimen: BLOOD LEFT HAND  Result Value Ref Range Status   Specimen Description BLOOD LEFT HAND  Final   Special Requests BOTTLES DRAWN AEROBIC AND ANAEROBIC  Final   Culture   Final    NO GROWTH 3 DAYS Performed at [facility], [address], [place], [place] [address]    Report Status PENDING  Incomplete  Resp panel by RT-PCR (RSV, Flu A&B, Covid) Anterior Nasal Swab     Status: None   Collection Time: 05/26/23  8:34 AM   Specimen: Anterior Nasal Swab  Result Value Ref Range Status   SARS Coronavirus 2 by RT PCR NEGATIVE NEGATIVE Final    Comment: (NOTE) SARS-CoV-2 target nucleic acids are NOT DETECTED.  The SARS-CoV-2 RNA is generally detectable in upper  respiratory specimens during the acute phase of infection. The lowest concentration of SARS-CoV-2 viral copies this assay can detect is 138 copies/mL. A negative result does not preclude SARS-Cov-2 infection and should not be used as the sole basis for treatment or other patient management decisions. A negative result may occur with  improper specimen collection/handling, submission of specimen other than nasopharyngeal swab, presence of viral mutation(s) within the areas targeted by this assay, and inadequate number of viral copies(<138 copies/mL). A negative result must be combined with clinical observations, patient history, and epidemiological information. The expected result is Negative.  Fact Sheet for Patients:  BloggerCourse.com  Fact Sheet for Healthcare Providers:  SeriousBroker.it  This test is no t yet approved or cleared by the [place] FDA and  has been authorized for detection and/or diagnosis of SARS-CoV-2 by FDA under an Emergency Use Authorization (EUA). This EUA will remain  in effect (meaning this test can be used) for the duration of the COVID-19 declaration under Section 564(b)(1) of the Act, 21 U.S.C.section 360bbb-3(b)(1), unless the authorization is terminated  or revoked sooner.       Influenza A by PCR NEGATIVE NEGATIVE Final   Influenza B by PCR NEGATIVE NEGATIVE Final    Comment: (NOTE) The Xpert Xpress SARS-CoV-2/FLU/RSV plus assay is intended as an aid in the diagnosis of influenza from Nasopharyngeal swab specimens and should not be used as a sole basis for treatment. Nasal washings and aspirates are unacceptable for Xpert Xpress SARS-CoV-2/FLU/RSV testing.  Fact Sheet for Patients: BloggerCourse.com  Fact Sheet for Healthcare Providers: SeriousBroker.it  This test is not yet approved or cleared by the [place] FDA and has been  authorized for detection and/or diagnosis of SARS-CoV-2 by FDA under an Emergency Use Authorization (EUA). This EUA will remain in effect (meaning this test can be used) for the duration of the COVID-19 declaration under Section 564(b)(1) of the Act, 21 U.S.C. section 360bbb-3(b)(1), unless the authorization is terminated or revoked.     Resp Syncytial Virus by PCR NEGATIVE NEGATIVE Final    Comment: (NOTE) Fact Sheet for Patients: BloggerCourse.com  Fact Sheet for Healthcare Providers: SeriousBroker.it  This test is not yet approved or cleared by the [place] FDA and has been authorized for detection and/or diagnosis of SARS-CoV-2 by  FDA under an Emergency Use Authorization (EUA). This EUA will remain in effect (meaning this test can be used) for the duration of the COVID-19 declaration under Section 564(b)(1) of the Act, 21 U.S.C. section 360bbb-3(b)(1), unless the authorization is terminated or revoked.  Performed at [facility], [address] [place], [place] [address]   MRSA Next Gen by PCR, Nasal     Status: None   Collection Time: 05/26/23 10:17 AM   Specimen: Nasal Mucosa; Nasal Swab  Result Value Ref Range Status   MRSA by PCR Next Gen NOT DETECTED NOT DETECTED Final    Comment: (NOTE) The GeneXpert MRSA Assay (FDA approved for NASAL specimens only), is one component of a comprehensive MRSA colonization surveillance program. It is not intended to diagnose MRSA infection nor to guide or monitor treatment for MRSA infections. Test performance is not FDA approved in patients less than 12 years old. Performed at [facility], [address], [place], [place] [address]      Scheduled Meds:  acetaminophen  1,000 mg Oral Q6H   arformoterol  15 mcg Nebulization BID   benzonatate  100 mg Oral BID   budesonide (PULMICORT) nebulizer solution  0.5 mg Nebulization BID   Chlorhexidine Gluconate Cloth  6 each Topical Daily    doxycycline  100 mg Oral Q12H   levalbuterol  1.25 mg Nebulization BID   levothyroxine  100 mcg Oral Daily   lidocaine  1 patch Transdermal Q24H   LORazepam  0.5 mg Oral QHS   melatonin  6 mg Oral QHS   methylPREDNISolone (SOLU-MEDROL) injection  40 mg Intravenous Q12H   metoprolol tartrate  50 mg Oral BID   osimertinib mesylate  80 mg Oral Daily   pantoprazole  40 mg Oral Daily   rosuvastatin  5 mg Oral Daily   Continuous Infusions:  piperacillin-tazobactam (ZOSYN)  IV 3.375 g (05/29/23 0532)    Procedures/Studies: ECHOCARDIOGRAM COMPLETE Result Date: 05/27/2023    ECHOCARDIOGRAM REPORT  . IMPRESSION: Dilated gallbladder with some sludge. No shadowing stones or further sonographic evidence of acute cholecystitis. The gallbladder wall thickening and adjacent fluid on the prior CT scan is not well seen on the current ultrasound. Further workup evaluation as clinically appropriate such as HIDA scan or MRI. No biliary ductal dilatation. Electronically Signed   By: Karen Kays M.D.   On: 05/27/2023 12:16   CT Soft Tissue Neck W Contrast Result Date: 05/26/2023 MPRESSION: 1. No acute or metastatic finding identified in the Neck. No neck mass is identified. 2. Visible upper Chest stable to PET-CT 03/01/2023. Electronically Signed   By: Odessa Fleming M.D.   On: 05/26/2023 11:30   DG CHEST PORT 1 VIEW Result Date: 05/26/2023 . IMPRESSION: Mildly lower lung volumes with otherwise stable post treatment appearance of the chest since December. Electronically Signed   By: Odessa Fleming M.D.   On: 05/26/2023 11:21   CT ABDOMEN PELVIS W CONTRAST Result Date: 05/25/2023  IMPRESSION: 1. Acute cholecystitis. 2. Moderate hiatal hernia. Fluid-filled distal esophagus suggesting changes of gastroesophageal reflux. 3. Moderate distal colonic diverticulosis without superimposed acute inflammatory change. 4. Stable sclerotic lesions within the spine at T11, L5, and S2 compatible with treated metastatic disease. Aortic  Atherosclerosis (ICD10-I70.0). Electronically Signed   By: Helyn Numbers M.D.   On: 05/25/2023 23:00   HIDA scan reviewed and resulted by radiologist essentially negative  SIGNED: Kendell Bane, MD, FHM. FAAFP Critical care time spent 55 minutes -in seeing evaluating patient, reviewing records, imaging,  labs, discussing with consultants, [facility],  Pager (please use [email or web address] to page/text)  Please use Epic Secure Chat for non-urgent communication (7AM-7PM) If 7PM-7AM, please contact night-coverage [email or web address],  05/29/2023, 11:19 AM    LOS: 4 days

## 2023-05-29 NOTE — Plan of Care (Signed)
  Problem: Acute Rehab PT Goals(only PT should resolve) Goal: Pt will Roll Supine to Side Flowsheets (Taken 05/29/2023 1601) Pt will Roll Supine to Side: Independently Goal: Pt Will Go Sit To Supine/Side Flowsheets (Taken 05/29/2023 1601) Pt will go Sit to Supine/Side: Independently Goal: Patient Will Perform Sitting Balance Flowsheets (Taken 05/29/2023 1601) Patient will perform sitting balance: Independently Goal: Patient Will Transfer Sit To/From Stand Flowsheets (Taken 05/29/2023 1601) Patient will transfer sit to/from stand: Independently Goal: Pt Will Ambulate Flowsheets (Taken 05/29/2023 1601) Pt will Ambulate:  100 feet  75 feet  with least restrictive assistive device  Independently  with modified independence  Nelida Meuse PT, DPT [facility]- [place] 336 [phone] office

## 2023-05-30 DIAGNOSIS — K810 Acute cholecystitis: Secondary | ICD-10-CM | POA: Diagnosis not present

## 2023-05-30 LAB — CBC
HCT: 29.9 % — ABNORMAL LOW (ref 36.0–46.0)
Hemoglobin: 9.1 g/dL — ABNORMAL LOW (ref 12.0–15.0)
MCH: 29.9 pg (ref 26.0–34.0)
MCHC: 30.4 g/dL (ref 30.0–36.0)
MCV: 98.4 fL (ref 80.0–100.0)
Platelets: 176 10*3/uL (ref 150–400)
RBC: 3.04 MIL/uL — ABNORMAL LOW (ref 3.87–5.11)
RDW: 13.4 % (ref 11.5–15.5)
WBC: 7 10*3/uL (ref 4.0–10.5)
nRBC: 0 % (ref 0.0–0.2)

## 2023-05-30 LAB — COMPREHENSIVE METABOLIC PANEL WITH GFR
ALT: 17 U/L (ref 0–44)
AST: 18 U/L (ref 15–41)
Albumin: 2.6 g/dL — ABNORMAL LOW (ref 3.5–5.0)
Alkaline Phosphatase: 79 U/L (ref 38–126)
Anion gap: 7 (ref 5–15)
BUN: 29 mg/dL — ABNORMAL HIGH (ref 8–23)
CO2: 21 mmol/L — ABNORMAL LOW (ref 22–32)
Calcium: 8.5 mg/dL — ABNORMAL LOW (ref 8.9–10.3)
Chloride: 109 mmol/L (ref 98–111)
Creatinine, Ser: 0.84 mg/dL (ref 0.44–1.00)
GFR, Estimated: >60 mL/min (ref >60)
Glucose, Bld: 136 mg/dL — ABNORMAL HIGH (ref 70–99)
Potassium: 3.7 mmol/L (ref 3.5–5.1)
Sodium: 137 mmol/L (ref 135–145)
Total Bilirubin: 0.4 mg/dL (ref 0.0–1.2)
Total Protein: 5.3 g/dL — ABNORMAL LOW (ref 6.5–8.1)

## 2023-05-30 LAB — TSH: TSH: 0.185 u[IU]/mL — ABNORMAL LOW (ref 0.350–4.500)

## 2023-05-30 LAB — T3, FREE: T3, Free: 1.4 pg/mL — ABNORMAL LOW (ref 2.0–4.4)

## 2023-05-30 MED ORDER — BENZONATATE 100 MG PO CAPS: 100.0000 mg | ORAL_CAPSULE | Freq: Two times a day (BID) | ORAL | 0 refills | Status: DC

## 2023-05-30 MED ORDER — GUAIFENESIN-DM 100-10 MG/5ML PO SYRP: 5.0000 mL | ORAL_SOLUTION | ORAL | 0 refills | Status: DC | PRN

## 2023-05-30 MED ORDER — ALUM & MAG HYDROXIDE-SIMETH 200-200-20 MG/5ML PO SUSP: 15.0000 mL | Freq: Four times a day (QID) | ORAL | 0 refills | Status: DC | PRN

## 2023-05-30 MED ORDER — ONDANSETRON HCL 4 MG PO TABS: 4.0000 mg | ORAL_TABLET | Freq: Four times a day (QID) | ORAL | 0 refills | Status: DC | PRN

## 2023-05-30 MED ORDER — METHYLPREDNISOLONE 4 MG PO TBPK: ORAL_TABLET | ORAL | 0 refills | Status: DC

## 2023-05-30 MED ORDER — OXYCODONE HCL 5 MG PO TABS
5.0000 mg | ORAL_TABLET | ORAL | 0 refills | Status: AC | PRN
Start: 2023-05-30 — End: 2023-06-02

## 2023-05-30 MED ORDER — ACETAMINOPHEN 500 MG PO TABS: 1000.0000 mg | ORAL_TABLET | Freq: Three times a day (TID) | ORAL | 0 refills | Status: DC | PRN

## 2023-05-30 MED ORDER — AMOXICILLIN-POT CLAVULANATE 875-125 MG PO TABS: 1.0000 | ORAL_TABLET | Freq: Two times a day (BID) | ORAL | 0 refills | Status: AC

## 2023-05-30 NOTE — TOC Transition Note (Signed)
 Transition of Care [facility]) - Discharge Note   Patient Details  Name: Linda Woods MRN: [id number] Date of Birth: 1950-09-14  Transition of Care [facility]) CM/SW Contact:  Leitha Bleak, RN Phone Number: 05/30/2023, 10:41 AM   Clinical Narrative:   Patient discharging home, PT is recommending HHPT. Patient is agreeable. CMS choices reviewed. Referral sent to [facility] with [place].   Final next level of care: Home w Home Health Services Barriers to Discharge: No Barriers Identified   Patient Goals and CMS Choice Patient states their goals for this hospitalization and ongoing recovery are:: return home          Discharge Placement                    Patient and family notified of of transfer: 05/30/23  Discharge Plan and Services Additional resources added to the After Visit Summary for        [facility] Arranged: PT HH Agency: [facility] Date [facility] Agency Contacted: 05/30/23 Time HH Agency Contacted: 1041 Representative spoke with at [facility] Agency: Kandee Keen  Social Drivers of Health (SDOH) Interventions SDOH Screenings   Food Insecurity: No Food Insecurity (05/26/2023)  Housing: Low Risk  (05/26/2023)  Transportation Needs: No Transportation Needs (05/26/2023)  Utilities: Not At Risk (05/26/2023)  Depression (PHQ2-9): Low Risk  (11/01/2022)  Social Connections: Unknown (05/26/2023)  Tobacco Use: Low Risk  (05/25/2023)    Readmission Risk Interventions     No data to display

## 2023-05-30 NOTE — Progress Notes (Signed)
 Mobility Specialist Progress Note:    05/30/23 1000  Mobility  Activity Ambulated with assistance in room;Ambulated with assistance to bathroom  Level of Assistance Contact guard assist, steadying assist  Assistive Device Front wheel walker  Distance Ambulated (ft) 35 ft  Range of Motion/Exercises Active;All extremities  Activity Response Tolerated well  Mobility Referral Yes  Mobility visit 1 Mobility  Mobility Specialist Start Time (ACUTE ONLY) 0945  Mobility Specialist Stop Time (ACUTE ONLY) 1000  Mobility Specialist Time Calculation (min) (ACUTE ONLY) 15 min   Pt received in bed, requesting assistance to bathroom. Required CGA to stand and ambulate with RW. Tolerated well, asx throughout. Left pt with NT, all needs met.   Lawerance Bach Mobility Specialist Please contact via Special educational needs teacher or  Rehab office at [phone]

## 2023-05-30 NOTE — Discharge Summary (Signed)
 Physician Discharge Summary   Patient: Linda Woods MRN: [id number] DOB: [birth year]  Admit date:     05/25/2023  Discharge date: 05/30/23  Discharge Physician: Kendell Bane   PCP: Assunta Found, MD   Recommendations at discharge:   Follow-up with PCP in 1-2 weeks Follow-up with general surgery Dr. Robyne Peers in 1 week Advance diet slowly, low-fat diet recommended  Discharge Diagnoses: Principal Problem:   Acute cholecystitis Active Problems:   Essential hypertension   Acquired hypothyroidism   Gastroesophageal reflux disease without esophagitis   Adenocarcinoma of left lung, stage 3 (HCC)   Asthma, chronic   Right sided abdominal pain   Mixed hyperlipidemia   Acute respiratory failure with hypoxia (HCC)   COPD with acute exacerbation (HCC)   Multifocal atrial tachycardia [facility])    Hospital Course: Linda Woods is a  73 year old female with a history of non-small cell lung cancer, hypertension, hyperlipidemia, hypothyroidism, obstructive lung disease/asthma presenting with right-sided abdominal pain that began on the morning of 05/25/2023.  She had complained of nausea but no emesis.  There is no diarrhea, hematochezia, melena.  She denied any chest pain or shortness of breath at the time of admission.  In the ED, the patient was afebrile and hemodynamically stable.  Oxygen saturation was 92% on room air.  WBC 9.6, hemoglobin 10.5, platelets 165.  Sodium 136, potassium 3.9, bicarbonate 23, serum creatinine 1.11.  AST 40, ALT 44, alk phosphatase 129, total bilirubin 0.6.  Lipase 62.   CT of the abdomen and pelvis showed gallbladder wall thickening with pericholecystic edema and distended gallbladder.  Patient was started IV Zosyn.  General surgery was consulted.  The patient was admitted for evaluation of acute cholecystitis  On the morning of 05/26/2023, the patient was short of breath and developed tachycardia with heart rate in the 140s.  She was moved to the  stepdown unit for COPD exacerbation and atrial tachycardia.  She was started on bronchodilators, steroids, and diltiazem.  Acute cholecystitis -Hemodynamically stable -HIDA scan apparently negative per  IR   -Continue Zosyn, reducing IVF as tolerating p.o. Augmentin for 2 more days, concluding 7 days of antibiotics   -CT AP as discussed above -RUQ US--Dilated gallbladder with some sludge. No shadowing stones or further sonographic evidence of acute cholecystitis -05/28/23>>discussed with IR>>HIDA reported negative, no need for cholecystectomy tube placement -05/28/23--discussed with Dr. Roselle Locus is neg>> patient can be started on low-fat diet, continue antibiotics     05/29/2023-General Surgery believe that there is no acute cholecystitis at this point, cholelithiasis-elective surgery as an outpatient-recommended total of 7 days of antibiotics, a low-fat diet as needed pain meds   Acute respiratory failure with hypoxia -Satting 100% on room air -secondary to COPD exacerbation   -Continue home inhalers as needed -Tapering down steroids -check COVID--neg -check viral resp panel-negative -Chest x-ray--no acute infiltrates or edema   COPD exacerbation -Exacerbation-resolved   Atrial tachycardia/MAT -Personally reviewed EKG 4/6>> atrial tachycardia, nonspecific STT wave changes -Start diltiazem drip initially>>stopped due to low BPs -4/7 Echo EF 60-65%, no WMA, normal RVF -now back in sinus -restart metoprolol-titrating up for better heart rate control   Essential hypertension -stable on metoprolol   Hypothyroidism -Continue Synthroid -TSH 3.4   NSCLC -Initially diagnosed 09/2019 -s/p 7 cycles paclitaxel and carboplatin -Continue Tagrisso -Follow-up Dr. Shirline Frees   Mixed hyperlipidemia -Continue statin   Status post right TKA -Patient had surgery 04/16/2023 -PT evaluation when stable= Home PT recommended   Acute on chronic anemia of chronic  disease -Monitoring H&H  closely -currently stable around 8.6>> 9.1 -   Hypokalemia -Was replaced  Consultants: General Surgery Procedures performed: Abdomen CT and MRCP Disposition: Home Diet recommendation:  Discharge Diet Orders (From admission, onward)     Start     Ordered   05/30/23 0000  Diet - low sodium heart healthy        05/30/23 0840           Cardiac diet DISCHARGE MEDICATION: Allergies as of 05/30/2023   No Known Allergies      Medication List     STOP taking these medications    loperamide 2 MG capsule Commonly known as: IMODIUM       TAKE these medications    acetaminophen 500 MG tablet Commonly known as: TYLENOL Take 2 tablets (1,000 mg total) by mouth every 8 (eight) hours as needed for moderate pain (pain score 4-6).   albuterol 108 (90 Base) MCG/ACT inhaler Commonly known as: VENTOLIN HFA Inhale 2 puffs into the lungs every 6 (six) hours as needed for wheezing or shortness of breath.   alum & mag hydroxide-simeth 200-200-20 MG/5ML suspension Commonly known as: MAALOX/MYLANTA Take 15 mLs by mouth every 6 (six) hours as needed for indigestion or heartburn.   amoxicillin-clavulanate 875-125 MG tablet Commonly known as: AUGMENTIN Take 1 tablet by mouth 2 (two) times daily for 2 days. Start taking on: May 31, 2023   benzonatate 100 MG capsule Commonly known as: TESSALON Take 1 capsule (100 mg total) by mouth 2 (two) times daily.   chlorhexidine 4 % external liquid Commonly known as: HIBICLENS Apply 15 mLs (1 Application total) topically as directed for 30 doses. Use as directed daily for 5 days every other week for 6 weeks.   cyanocobalamin 1000 MCG tablet Commonly known as: VITAMIN B12 Take 1,000 mcg by mouth daily.   famotidine 40 MG tablet Commonly known as: PEPCID Take 40 mg by mouth at bedtime.   ferrous sulfate 325 (65 FE) MG tablet Take 325 mg by mouth daily with breakfast.   fexofenadine 180 MG tablet Commonly known as: ALLEGRA Take 180  mg by mouth daily as needed for allergies.   fluticasone 50 MCG/ACT nasal spray Commonly known as: FLONASE Place 1 spray into both nostrils as needed for allergies.   guaiFENesin-dextromethorphan 100-10 MG/5ML syrup Commonly known as: ROBITUSSIN DM Take 5 mLs by mouth every 4 (four) hours as needed for cough (chest congestion).   levothyroxine 100 MCG tablet Commonly known as: Synthroid Take 1 tablet (100 mcg total) by mouth daily. One po qd   lidocaine 5 % Commonly known as: LIDODERM Place 1 patch onto the skin daily.   LORazepam 0.5 MG tablet Commonly known as: ATIVAN Take 0.5 mg by mouth at bedtime.   MAGNESIUM PO Take 1 tablet by mouth daily.   melatonin 5 MG Tabs Take 5 mg by mouth at bedtime as needed (sleep).   methylPREDNISolone 4 MG Tbpk tablet Commonly known as: MEDROL DOSEPAK Medrol Dosepak take as instructed   metoprolol succinate 50 MG 24 hr tablet Commonly known as: TOPROL-XL Take 1 tablet (50 mg total) by mouth daily.   Myrbetriq 50 MG Tb24 tablet Generic drug: mirabegron ER Take 50 mg by mouth daily.   ondansetron 4 MG tablet Commonly known as: ZOFRAN Take 1 tablet (4 mg total) by mouth every 6 (six) hours as needed for nausea.   oxyCODONE 5 MG immediate release tablet Commonly known as: Oxy IR/ROXICODONE Take 1-2 tablets (5-10  mg total) by mouth every 4 (four) hours as needed for severe pain (pain score 7-10).   POTASSIUM PO Take 1 tablet by mouth daily.   Premarin vaginal cream Generic drug: conjugated estrogens Place 1 applicator vaginally as needed (urinary issues).   RABEprazole 20 MG tablet Commonly known as: Aciphex Take 1 tablet (20 mg total) by mouth 2 (two) times a week. What changed: when to take this   rosuvastatin 5 MG tablet Commonly known as: CRESTOR Take 1 tablet (5 mg total) by mouth daily.   Tagrisso 80 MG tablet Generic drug: osimertinib mesylate TAKE 1 TABLET BY MOUTH DAILY.   Tiotropium Bromide-Olodaterol 2.5-2.5  MCG/ACT Aers Inhale 2 puffs into the lungs daily.   triamcinolone cream 0.1 % Commonly known as: KENALOG Apply 1 Application topically 2 (two) times daily. As needed for itching/rash What changed:  when to take this reasons to take this additional instructions   Vitamin D 50 MCG (2000 UT) tablet Take 2,000 Units by mouth daily.        Follow-up Information     Pappayliou, Gustavus Messing, DO. Call.   Specialty: General Surgery Why: Call to schedule follow-up with me in 1 to 2 weeks after discharge Contact information: [address] [place] [facility] [address] [phone]         Lanelle Bal, DO. Schedule an appointment as soon as possible for a visit.   Specialty: Gastroenterology Why: As needed for persistent nausea Contact information: [address] [place] [address] [phone]                Discharge Exam: Ceasar Mons Weights   05/25/23 2138  Weight: 66.7 kg        General:  AAO x 3,  cooperative, no distress;   HEENT:  Normocephalic, PERRL, otherwise with in Normal limits   Neuro:  CNII-XII intact. , normal motor and sensation, reflexes intact   Lungs:   Clear to auscultation BL, Respirations unlabored,  No wheezes / crackles  Cardio:    S1/S2, RRR, No murmure, No Rubs or Gallops   Abdomen:  Soft, non-tender, bowel sounds active all four quadrants, no guarding or peritoneal signs.  Muscular  skeletal:  Limited exam -global generalized weaknesses - in bed, able to move all 4 extremities,   2+ pulses,  symmetric, No pitting edema  Skin:  Dry, warm to touch, negative for any Rashes,  Wounds: Please see nursing documentation          Condition at discharge: good  The results of significant diagnostics from this hospitalization (including imaging, microbiology, ancillary and laboratory) are listed below for reference.   Imaging Studies: NM Hepatobiliary Liver Func Result Date: 05/28/2023 CLINICAL DATA:  Abdominal pain, concern for  cholecystitis EXAM: NUCLEAR MEDICINE HEPATOBILIARY IMAGING TECHNIQUE: Sequential images of the abdomen were obtained out to 60 minutes following intravenous administration of radiopharmaceutical. RADIOPHARMACEUTICALS:  5.0 mCi Tc-1m  Choletec IV COMPARISON:  None Available. FINDINGS: Prompt uptake and biliary excretion of activity by the liver is seen. Gallbladder activity is visualized, consistent with patency of cystic duct. Biliary activity passes into small bowel, consistent with patent common bile duct. IMPRESSION: Normal hepatobiliary scan. Electronically Signed   By: Judie Petit.  Shick M.D.   On: 05/28/2023 12:07   ECHOCARDIOGRAM COMPLETE Result Date: 05/27/2023    ECHOCARDIOGRAM REPORT   Patient Name:   LANDEN BREELAND Forgette Date of Exam: 05/27/2023 Medical Rec #:  [id number]           Height:  64.0 in Accession #:    [id number]          Weight:       147.0 lb Date of Birth:  10/26/1950          BSA:          1.716 m Patient Age:    72 years            BP:           112/43 mmHg Patient Gender: F                   HR:           122 bpm. [facility] Procedure: 2D Echo, Cardiac Doppler and Color Doppler (Both Spectral and Color            Flow Doppler were utilized during procedure). Indications:    Atrial tachycardia [facility])  History:        Patient has prior history of Echocardiogram examinations, most                 recent 07/19/2021. COPD; Risk Factors:Hypertension and                 Dyslipidemia. Adenocarcinoma of left lung, stage 3 (HCC).  Sonographer:    Celesta Gentile RCS Referring Phys: [id number] DAVID TAT IMPRESSIONS  1. Left ventricular ejection fraction, by estimation, is 60 to 65%. The left ventricle has normal function. The left ventricle has no regional wall motion abnormalities. Left ventricular diastolic parameters are indeterminate.  2. Right ventricular systolic function is normal. The right ventricular size is normal.  3. Left atrial size was mildly dilated.  4. The mitral valve is normal  in structure. No evidence of mitral valve regurgitation. No evidence of mitral stenosis.  5. The aortic valve is tricuspid. Aortic valve regurgitation is not visualized. No aortic stenosis is present.  6. The inferior vena cava is normal in size with greater than 50% respiratory variability, suggesting right atrial pressure of 3 mmHg. FINDINGS  Left Ventricle: Left ventricular ejection fraction, by estimation, is 60 to 65%. The left ventricle has normal function. The left ventricle has no regional wall motion abnormalities. The left ventricular internal cavity size was normal in size. There is  no left ventricular hypertrophy. Left ventricular diastolic parameters are indeterminate. Right Ventricle: The right ventricular size is normal. Right vetricular wall thickness was not well visualized. Right ventricular systolic function is normal. Left Atrium: Left atrial size was mildly dilated. Right Atrium: Right atrial size was normal in size. Pericardium: There is no evidence of pericardial effusion. Mitral Valve: The mitral valve is normal in structure. No evidence of mitral valve regurgitation. No evidence of mitral valve stenosis. Tricuspid Valve: The tricuspid valve is normal in structure. Tricuspid valve regurgitation is not demonstrated. No evidence of tricuspid stenosis. Aortic Valve: The aortic valve is tricuspid. Aortic valve regurgitation is not visualized. No aortic stenosis is present. Aortic valve mean gradient measures 4.8 mmHg. Aortic valve peak gradient measures 9.4 mmHg. Aortic valve area, by VTI measures 2.43 cm. Pulmonic Valve: The pulmonic valve was not well visualized. Pulmonic valve regurgitation is not visualized. No evidence of pulmonic stenosis. Aorta: The aortic root is normal in size and structure. Venous: The inferior vena cava is normal in size with greater than 50% respiratory variability, suggesting right atrial pressure of 3 mmHg. IAS/Shunts: No atrial level shunt detected by color flow  Doppler.  LEFT VENTRICLE PLAX 2D  LVIDd:         5.00 cm LVIDs:         2.50 cm LV PW:         1.00 cm LV IVS:        0.70 cm LVOT diam:     1.90 cm LV SV:         73 LV SV Index:   42 LVOT Area:     2.84 cm  RIGHT VENTRICLE RV S prime:     19.40 cm/s TAPSE (M-mode): 2.3 cm LEFT ATRIUM           Index        RIGHT ATRIUM           Index LA diam:      3.30 cm 1.92 cm/m   RA Area:     16.70 cm LA Vol (A2C): 50.7 ml 29.54 ml/m  RA Volume:   46.30 ml  26.97 ml/m LA Vol (A4C): 62.8 ml 36.59 ml/m  AORTIC VALVE AV Area (Vmax):    2.09 cm AV Area (Vmean):   2.46 cm AV Area (VTI):     2.43 cm AV Vmax:           153.65 cm/s AV Vmean:          101.337 cm/s AV VTI:            0.300 m AV Peak Grad:      9.4 mmHg AV Mean Grad:      4.8 mmHg LVOT Vmax:         113.00 cm/s LVOT Vmean:        88.000 cm/s LVOT VTI:          0.257 m LVOT/AV VTI ratio: 0.86  AORTA Ao Root diam: 3.00 cm MITRAL VALVE MV Area (PHT): 4.68 cm     SHUNTS MV Decel Time: 162 msec     Systemic VTI:  0.26 m MV E velocity: 131.00 cm/s  Systemic Diam: 1.90 cm Dina Rich MD Electronically signed by Dina Rich MD Signature Date/Time: 05/27/2023/3:37:30 PM    Final    US Abdomen Limited RUQ (LIVER/GB) Result Date: 05/27/2023 CLINICAL DATA:  Cholecystitis. EXAM: ULTRASOUND ABDOMEN LIMITED RIGHT UPPER QUADRANT COMPARISON:  CT 05/25/2023 FINDINGS: Gallbladder: Dilated gallbladder. No wall thickening. No adjacent fluid seen on today's examination. There is some layering sludge. No shadowing stones clearly seen at this time. Common bile duct: Diameter: 5 mm Liver: No focal lesion identified. Within normal limits in parenchymal echogenicity. Portal vein is patent on color Doppler imaging with normal direction of blood flow towards the liver. Other: None. IMPRESSION: Dilated gallbladder with some sludge. No shadowing stones or further sonographic evidence of acute cholecystitis. The gallbladder wall thickening and adjacent fluid on the prior CT scan is  not well seen on the current ultrasound. Further workup evaluation as clinically appropriate such as HIDA scan or MRI. No biliary ductal dilatation. Electronically Signed   By: Karen Kays M.D.   On: 05/27/2023 12:16   CT Soft Tissue Neck W Contrast Result Date: 05/26/2023 CLINICAL DATA:  73 year old female with neck mass. Non-small cell lung cancer. EXAM: CT NECK WITH CONTRAST TECHNIQUE: Multidetector CT imaging of the neck was performed using the standard protocol following the bolus administration of intravenous contrast. RADIATION DOSE REDUCTION: This exam was performed according to the departmental dose-optimization program which includes automated exposure control, adjustment of the mA and/or kV according to patient size and/or use of iterative reconstruction technique. CONTRAST:  60mL OMNIPAQUE IOHEXOL 300 MG/ML  SOLN COMPARISON:  PET-CT 03/01/2023.  Neck CT 10/13/2019. FINDINGS: Pharynx and larynx: Larynx and pharynx soft tissue contours are within normal limits. Parapharyngeal and retropharyngeal spaces appear negative. Salivary glands: Some bilateral submandibular gland atrophy since 2021. Otherwise negative. Negative sublingual space. Thyroid: Negative. Lymph nodes: Regressed left lower neck lymph nodes described in 2021, punctate now on series 2, image 69. No cervical lymphadenopathy. No marked area of clinical concern identified. Vascular: Major vascular structures in the bilateral neck and at the skull base are enhancing and patent. Carotid bifurcation atherosclerosis. Limited intracranial: Negative. Visualized orbits: Negative. Mastoids and visualized paranasal sinuses: Clear. Skeleton: Cervical spine degeneration not significantly changed since 2021. No acute osseous abnormality identified. Upper chest: Stable to PET-CT 03/01/2023 (please see that report). IMPRESSION: 1. No acute or metastatic finding identified in the Neck. No neck mass is identified. 2. Visible upper Chest stable to PET-CT  03/01/2023. Electronically Signed   By: Odessa Fleming M.D.   On: 05/26/2023 11:30   DG CHEST PORT 1 VIEW Result Date: 05/26/2023 CLINICAL DATA:  73 year old female with shortness of breath. History of lung cancer. EXAM: PORTABLE CHEST 1 VIEW COMPARISON:  Cardiac CT 04/02/2023 and earlier. FINDINGS: Portable AP semi upright view at 0815 hours. Chronic appearing hilar architectural distortion greater on the right is stable since December radiographs. Gastric hiatal hernia better demonstrated by CT. Other mediastinal contours are within normal limits. Lower lung volumes since December. No pneumothorax, pulmonary edema, pleural effusion or acute lung opacity. Visualized tracheal air column is within normal limits. Stable visualized osseous structures. Negative visible bowel gas. IMPRESSION: Mildly lower lung volumes with otherwise stable post treatment appearance of the chest since December. Electronically Signed   By: Odessa Fleming M.D.   On: 05/26/2023 11:21   CT ABDOMEN PELVIS W CONTRAST Result Date: 05/25/2023 CLINICAL DATA:  Acute nonlocalized abdominal pain, right upper quadrant abdominal pain EXAM: CT ABDOMEN AND PELVIS WITH CONTRAST TECHNIQUE: Multidetector CT imaging of the abdomen and pelvis was performed using the standard protocol following bolus administration of intravenous contrast. RADIATION DOSE REDUCTION: This exam was performed according to the departmental dose-optimization program which includes automated exposure control, adjustment of the mA and/or kV according to patient size and/or use of iterative reconstruction technique. CONTRAST:  80mL OMNIPAQUE IOHEXOL 300 MG/ML  SOLN COMPARISON:  10/14/2019 FINDINGS: Lower chest: No acute abnormality. Moderate hiatal hernia. The distal esophagus is fluid-filled suggesting changes of gastroesophageal reflux. 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 Hepatobiliary: Mild perihepatic ascites. The gallbladder is distended, there is gallbladder wall thickening, pericholecystic edema within the gallbladder fossa, and extensive peri-cholecystic inflammatory stranding in keeping with changes of acute cholecystitis. Liver unremarkable. No intra or extrahepatic biliary ductal dilation. Pancreas: Unremarkable Spleen: Unremarkable Adrenals/Urinary Tract: Adrenal glands are unremarkable. Simple cortical cysts are seen within the right kidney for which no follow-up imaging is recommended. The kidneys are otherwise unremarkable. Bladder unremarkable. Stomach/Bowel: Moderate descending and sigmoid colonic diverticulosis. Stomach, small bowel, and large bowel are otherwise unremarkable. Appendix absent. No evidence of obstruction or focal inflammation. No free intraperitoneal gas or fluid. Vascular/Lymphatic: Aortic atherosclerosis. No enlarged abdominal or pelvic lymph nodes. Reproductive: Status post hysterectomy. No adnexal masses. Other: No abdominal wall hernia Musculoskeletal: Chronic  anterior wedge compression deformity of L1 is seen with approximately 50% loss of height anteriorly, stable from PET CT of 03/01/2023. No retropulsion. Multiple sclerotic lesions have developed within the a spine at T11, L5, and S2 which are stable since PET CT examination of 03/01/2023 and are compatible with treated metastatic disease. IMPRESSION: 1. Acute cholecystitis. 2. Moderate hiatal hernia. Fluid-filled distal esophagus suggesting changes of gastroesophageal reflux. 3. Moderate distal colonic diverticulosis without superimposed acute inflammatory change. 4. Stable sclerotic lesions within the spine at T11, L5, and S2 compatible with treated metastatic disease. Aortic Atherosclerosis (ICD10-I70.0). Electronically Signed    By: Helyn Numbers M.D.   On: 05/25/2023 23:00    Microbiology: Results for orders placed or performed during the hospital encounter of 05/25/23  Culture, blood (Routine X 2) w Reflex to ID Panel     Status: None (Preliminary result)   Collection Time: 05/26/23  4:29 AM   Specimen: Left Antecubital; Blood  Result Value Ref Range Status   Specimen Description LEFT ANTECUBITAL  Final   Special Requests BOTTLES DRAWN AEROBIC AND ANAEROBIC  Final   Culture   Final    NO GROWTH 4 DAYS Performed at [facility], [address] [place], [place] [address]    Report Status PENDING  Incomplete  Culture, blood (Routine X 2) w Reflex to ID Panel     Status: None (Preliminary result)   Collection Time: 05/26/23  4:41 AM   Specimen: BLOOD LEFT HAND  Result Value Ref Range Status   Specimen Description BLOOD LEFT HAND  Final   Special Requests BOTTLES DRAWN AEROBIC AND ANAEROBIC  Final   Culture   Final    NO GROWTH 4 DAYS Performed at [facility], [address], [place], [place] [address]    Report Status PENDING  Incomplete  Resp panel by RT-PCR (RSV, Flu A&B, Covid) Anterior Nasal Swab     Status: None   Collection Time: 05/26/23  8:34 AM   Specimen: Anterior Nasal Swab  Result Value Ref Range Status   SARS Coronavirus 2 by RT PCR NEGATIVE NEGATIVE Final    Comment: (NOTE) SARS-CoV-2 target nucleic acids are NOT DETECTED.  The SARS-CoV-2 RNA is generally detectable in upper respiratory specimens during the acute phase of infection. The lowest concentration of SARS-CoV-2 viral copies this assay can detect is 138 copies/mL. A negative result does not preclude SARS-Cov-2 infection and should not be used as the sole basis for treatment or other patient management decisions. A negative result may occur with  improper specimen collection/handling, submission of specimen other than nasopharyngeal swab, presence of viral mutation(s) within the areas targeted by this assay, and inadequate  number of viral copies(<138 copies/mL). A negative result must be combined with clinical observations, patient history, and epidemiological information. The expected result is Negative.  Fact Sheet for Patients:  BloggerCourse.com  Fact Sheet for Healthcare Providers:  SeriousBroker.it  This test is no t yet approved or cleared by the [place] FDA and  has been authorized for detection and/or diagnosis of SARS-CoV-2 by FDA under an Emergency Use Authorization (EUA). This EUA will remain  in effect (meaning this test can be used) for the duration of the COVID-19 declaration under Section 564(b)(1) of the Act, 21 U.S.C.section 360bbb-3(b)(1), unless the authorization is terminated  or revoked sooner.       Influenza A by PCR NEGATIVE NEGATIVE Final   Influenza B by PCR NEGATIVE NEGATIVE Final    Comment: (NOTE) The Xpert Xpress SARS-CoV-2/FLU/RSV  plus assay is intended as an aid in the diagnosis of influenza from Nasopharyngeal swab specimens and should not be used as a sole basis for treatment. Nasal washings and aspirates are unacceptable for Xpert Xpress SARS-CoV-2/FLU/RSV testing.  Fact Sheet for Patients: BloggerCourse.com  Fact Sheet for Healthcare Providers: SeriousBroker.it  This test is not yet approved or cleared by the [place] FDA and has been authorized for detection and/or diagnosis of SARS-CoV-2 by FDA under an Emergency Use Authorization (EUA). This EUA will remain in effect (meaning this test can be used) for the duration of the COVID-19 declaration under Section 564(b)(1) of the Act, 21 U.S.C. section 360bbb-3(b)(1), unless the authorization is terminated or revoked.     Resp Syncytial Virus by PCR NEGATIVE NEGATIVE Final    Comment: (NOTE) Fact Sheet for Patients: BloggerCourse.com  Fact Sheet for Healthcare  Providers: SeriousBroker.it  This test is not yet approved or cleared by the [place] FDA and has been authorized for detection and/or diagnosis of SARS-CoV-2 by FDA under an Emergency Use Authorization (EUA). This EUA will remain in effect (meaning this test can be used) for the duration of the COVID-19 declaration under Section 564(b)(1) of the Act, 21 U.S.C. section 360bbb-3(b)(1), unless the authorization is terminated or revoked.  Performed at [facility], [address], [place], [place] [address]   MRSA Next Gen by PCR, Nasal     Status: None   Collection Time: 05/26/23 10:17 AM   Specimen: Nasal Mucosa; Nasal Swab  Result Value Ref Range Status   MRSA by PCR Next Gen NOT DETECTED NOT DETECTED Final    Comment: (NOTE) The GeneXpert MRSA Assay (FDA approved for NASAL specimens only), is one component of a comprehensive MRSA colonization surveillance program. It is not intended to diagnose MRSA infection nor to guide or monitor treatment for MRSA infections. Test performance is not FDA approved in patients less than 17 years old. Performed at [facility], [address], [place], [place] [address]     Labs: CBC: Recent Labs  Lab 05/26/23 0441 05/27/23 0407 05/28/23 0512 05/29/23 0449 05/30/23 0422  WBC 11.1* 10.3 10.3 6.5 7.0  HGB 9.5* 7.6* 8.4* 8.6* 9.1*  HCT 31.0* 24.7* 26.6* 28.2* 29.9*  MCV 96.6 95.0 95.7 96.9 98.4  PLT 153 141* 139* 154 176   Basic Metabolic Panel: Recent Labs  Lab 05/26/23 0441 05/26/23 1631 05/27/23 0407 05/28/23 0512 05/29/23 0449 05/30/23 0422  NA 135 134* 137 139 139 137  K 4.0 3.6 3.1* 4.3 4.4 3.7  CL 101 101 105 109 110 109  CO2 24 23 23 23  21* 21*  GLUCOSE 134* 202* 184* 151* 150* 136*  BUN 11 13 16 22  32* 29*  CREATININE 0.91 1.04* 0.89 0.96 0.96 0.84  CALCIUM 9.0 8.6* 8.7* 8.5* 8.6* 8.5*  MG 1.7 2.0 1.9 1.8  --   --   PHOS 3.7  --   --   --   --   --    Liver Function Tests: Recent Labs   Lab 05/25/23 2204 05/26/23 0441 05/30/23 0422  AST 48* 35 18  ALT 44 38 17  ALKPHOS 129* 124 79  BILITOT 0.6 0.6 0.4  PROT 6.9 6.3* 5.3*  ALBUMIN 3.9 3.6 2.6*   CBG: No results for input(s): "GLUCAP" in the last 168 hours.  Discharge time spent: greater than 30 minutes.  Signed: Kendell Bane, MD [facility] 05/30/2023

## 2023-05-30 NOTE — Care Management Important Message (Signed)
 Important Message  Patient Details  Name: Linda Woods MRN: [id number] Date of Birth: 1950-10-17   Important Message Given:  Yes - Medicare IM     Corey Harold 05/30/2023, 11:10 AM

## 2023-05-31 LAB — CULTURE, BLOOD (ROUTINE X 2)
Culture: NO GROWTH
Culture: NO GROWTH

## 2023-06-01 DIAGNOSIS — Z87442 Personal history of urinary calculi: Secondary | ICD-10-CM | POA: Diagnosis not present

## 2023-06-01 DIAGNOSIS — I700 Atherosclerosis of aorta: Secondary | ICD-10-CM | POA: Diagnosis not present

## 2023-06-01 DIAGNOSIS — F419 Anxiety disorder, unspecified: Secondary | ICD-10-CM | POA: Diagnosis not present

## 2023-06-01 DIAGNOSIS — Z86718 Personal history of other venous thrombosis and embolism: Secondary | ICD-10-CM | POA: Diagnosis not present

## 2023-06-01 DIAGNOSIS — C3412 Malignant neoplasm of upper lobe, left bronchus or lung: Secondary | ICD-10-CM | POA: Diagnosis not present

## 2023-06-01 DIAGNOSIS — J4489 Other specified chronic obstructive pulmonary disease: Secondary | ICD-10-CM | POA: Diagnosis not present

## 2023-06-01 DIAGNOSIS — I119 Hypertensive heart disease without heart failure: Secondary | ICD-10-CM | POA: Diagnosis not present

## 2023-06-01 DIAGNOSIS — Z9181 History of falling: Secondary | ICD-10-CM | POA: Diagnosis not present

## 2023-06-01 DIAGNOSIS — K5730 Diverticulosis of large intestine without perforation or abscess without bleeding: Secondary | ICD-10-CM | POA: Diagnosis not present

## 2023-06-01 DIAGNOSIS — K591 Functional diarrhea: Secondary | ICD-10-CM | POA: Diagnosis not present

## 2023-06-01 DIAGNOSIS — E782 Mixed hyperlipidemia: Secondary | ICD-10-CM | POA: Diagnosis not present

## 2023-06-01 DIAGNOSIS — Z96653 Presence of artificial knee joint, bilateral: Secondary | ICD-10-CM | POA: Diagnosis not present

## 2023-06-01 DIAGNOSIS — E039 Hypothyroidism, unspecified: Secondary | ICD-10-CM | POA: Diagnosis not present

## 2023-06-01 DIAGNOSIS — J9601 Acute respiratory failure with hypoxia: Secondary | ICD-10-CM | POA: Diagnosis not present

## 2023-06-01 DIAGNOSIS — D630 Anemia in neoplastic disease: Secondary | ICD-10-CM | POA: Diagnosis not present

## 2023-06-01 DIAGNOSIS — M1990 Unspecified osteoarthritis, unspecified site: Secondary | ICD-10-CM | POA: Diagnosis not present

## 2023-06-01 DIAGNOSIS — K810 Acute cholecystitis: Secondary | ICD-10-CM | POA: Diagnosis not present

## 2023-06-01 DIAGNOSIS — G2581 Restless legs syndrome: Secondary | ICD-10-CM | POA: Diagnosis not present

## 2023-06-01 DIAGNOSIS — K449 Diaphragmatic hernia without obstruction or gangrene: Secondary | ICD-10-CM | POA: Diagnosis not present

## 2023-06-01 DIAGNOSIS — Z8701 Personal history of pneumonia (recurrent): Secondary | ICD-10-CM | POA: Diagnosis not present

## 2023-06-01 DIAGNOSIS — I4719 Other supraventricular tachycardia: Secondary | ICD-10-CM | POA: Diagnosis not present

## 2023-06-01 DIAGNOSIS — K219 Gastro-esophageal reflux disease without esophagitis: Secondary | ICD-10-CM | POA: Diagnosis not present

## 2023-06-01 DIAGNOSIS — M722 Plantar fascial fibromatosis: Secondary | ICD-10-CM | POA: Diagnosis not present

## 2023-06-05 ENCOUNTER — Other Ambulatory Visit: Payer: Medicare Other

## 2023-06-05 ENCOUNTER — Telehealth: Payer: Self-pay | Admitting: Family Medicine

## 2023-06-05 NOTE — Telephone Encounter (Signed)
 Received call from patients husband concerning her RLQ pain. He states that she is in pain - rating it at a 5 - and that she has taken hydrocodone 5/325mg  about an hour ago but has not helped a lot and they did not want to go back to the ER. He was wanting to know if we had a provider in office today and we do not have a provider in office this week. She does have an appt with Dr. Cherilyn Corn 06/12/23 at 3 pm. He also states that tylenol does not help her at all. She has Oxycodone on hand from ER. She is having a lot of nausea also but has not taken any for that issue. She does have Zofran on hand from cancer MD.   Informed him that he could take her back to ER or try to control her pain with meds and diet. Recommended that he can rotate Tylenol 1000mg  with Ibuprofen 800mg  q 6 hours along with the Oxycodone prn severe pain and take the zofran for her nausea. Try to get her pain under a 5 with medication and stay on a bland diet. He verbalized understanding and if she get worse he will take her back to ER.

## 2023-06-06 ENCOUNTER — Other Ambulatory Visit: Payer: Self-pay

## 2023-06-06 DIAGNOSIS — G2581 Restless legs syndrome: Secondary | ICD-10-CM | POA: Diagnosis not present

## 2023-06-06 DIAGNOSIS — E782 Mixed hyperlipidemia: Secondary | ICD-10-CM | POA: Diagnosis not present

## 2023-06-06 DIAGNOSIS — I700 Atherosclerosis of aorta: Secondary | ICD-10-CM | POA: Diagnosis not present

## 2023-06-06 DIAGNOSIS — J9601 Acute respiratory failure with hypoxia: Secondary | ICD-10-CM | POA: Diagnosis not present

## 2023-06-06 DIAGNOSIS — K449 Diaphragmatic hernia without obstruction or gangrene: Secondary | ICD-10-CM | POA: Diagnosis not present

## 2023-06-06 DIAGNOSIS — C3412 Malignant neoplasm of upper lobe, left bronchus or lung: Secondary | ICD-10-CM | POA: Diagnosis not present

## 2023-06-06 DIAGNOSIS — K591 Functional diarrhea: Secondary | ICD-10-CM | POA: Diagnosis not present

## 2023-06-06 DIAGNOSIS — K5730 Diverticulosis of large intestine without perforation or abscess without bleeding: Secondary | ICD-10-CM | POA: Diagnosis not present

## 2023-06-06 DIAGNOSIS — E039 Hypothyroidism, unspecified: Secondary | ICD-10-CM | POA: Diagnosis not present

## 2023-06-06 DIAGNOSIS — I119 Hypertensive heart disease without heart failure: Secondary | ICD-10-CM | POA: Diagnosis not present

## 2023-06-06 DIAGNOSIS — M722 Plantar fascial fibromatosis: Secondary | ICD-10-CM | POA: Diagnosis not present

## 2023-06-06 DIAGNOSIS — I4719 Other supraventricular tachycardia: Secondary | ICD-10-CM | POA: Diagnosis not present

## 2023-06-06 DIAGNOSIS — K810 Acute cholecystitis: Secondary | ICD-10-CM | POA: Diagnosis not present

## 2023-06-06 DIAGNOSIS — J4489 Other specified chronic obstructive pulmonary disease: Secondary | ICD-10-CM | POA: Diagnosis not present

## 2023-06-06 DIAGNOSIS — M1990 Unspecified osteoarthritis, unspecified site: Secondary | ICD-10-CM | POA: Diagnosis not present

## 2023-06-06 DIAGNOSIS — F419 Anxiety disorder, unspecified: Secondary | ICD-10-CM | POA: Diagnosis not present

## 2023-06-10 NOTE — Progress Notes (Deleted)
 GI Office Note    Referring Provider: Minus Amel, MD Primary Care Physician:  Minus Amel, MD  Primary Gastroenterologist: *** Chief Complaint   No chief complaint on file.   History of Present Illness   Linda Woods is a 73 y.o. female presenting today at the request of Minus Amel, MD for ***  Patient recently hospitalized for right-sided abdominal pain as well as nausea without vomiting.  In the ER she had normal white blood cell count but with hemoglobin 10.5.  AST 40, ALT 44, alk phos 129, T. bili 0.6 and lipase 62.  CT showed gallbladder wall thickening with pericholecystic edema and distended gallbladder.  She is given IV Zosyn  with general surgery consult.  Per general surgery, did not feel as though based off of CT and HIDA scan that she had any acute cholecystitis.  There was no need for cholecystectomy drain.  Her HIDA was essentially negative although she did have dilated gallbladder with some sludge on ultrasound.  She was advised to continue antibiotics and follow low-fat diet.  They had discussion about elective surgery for cholecystitis and recommended outpatient follow-up in a week.  Has appointment with Dr. Cherilyn Corn tomorrow 4/23.    Today:    Wt Readings from Last 3 Encounters:  05/25/23 147 lb (66.7 kg)  05/08/23 155 lb (70.3 kg)  04/16/23 162 lb 9.6 oz (73.8 kg)    Current Outpatient Medications  Medication Sig Dispense Refill   acetaminophen  (TYLENOL ) 500 MG tablet Take 2 tablets (1,000 mg total) by mouth every 8 (eight) hours as needed for moderate pain (pain score 4-6). 30 tablet 0   albuterol  (VENTOLIN  HFA) 108 (90 Base) MCG/ACT inhaler Inhale 2 puffs into the lungs every 6 (six) hours as needed for wheezing or shortness of breath. 8.5 g 0   alum & mag hydroxide-simeth (MAALOX/MYLANTA) 200-200-20 MG/5ML suspension Take 15 mLs by mouth every 6 (six) hours as needed for indigestion or heartburn. 355 mL 0   benzonatate  (TESSALON ) 100 MG  capsule Take 1 capsule (100 mg total) by mouth 2 (two) times daily. 20 capsule 0   chlorhexidine  (HIBICLENS ) 4 % external liquid Apply 15 mLs (1 Application total) topically as directed for 30 doses. Use as directed daily for 5 days every other week for 6 weeks. 946 mL 1   Cholecalciferol (VITAMIN D ) 50 MCG (2000 UT) tablet Take 2,000 Units by mouth daily.     famotidine  (PEPCID ) 40 MG tablet Take 40 mg by mouth at bedtime.     ferrous sulfate  325 (65 FE) MG tablet Take 325 mg by mouth daily with breakfast.     fexofenadine (ALLEGRA) 180 MG tablet Take 180 mg by mouth daily as needed for allergies.     fluticasone  (FLONASE ) 50 MCG/ACT nasal spray Place 1 spray into both nostrils as needed for allergies.     guaiFENesin -dextromethorphan  (ROBITUSSIN DM) 100-10 MG/5ML syrup Take 5 mLs by mouth every 4 (four) hours as needed for cough (chest congestion). 118 mL 0   levothyroxine  (SYNTHROID ) 100 MCG tablet Take 1 tablet (100 mcg total) by mouth daily. One po qd 90 tablet 0   lidocaine  (LIDODERM ) 5 % Place 1 patch onto the skin daily.     LORazepam  (ATIVAN ) 0.5 MG tablet Take 0.5 mg by mouth at bedtime.      MAGNESIUM  PO Take 1 tablet by mouth daily.     melatonin 5 MG TABS Take 5 mg by mouth at bedtime as needed (sleep).  methylPREDNISolone  (MEDROL  DOSEPAK) 4 MG TBPK tablet Medrol  Dosepak take as instructed 21 tablet 0   metoprolol  succinate (TOPROL -XL) 50 MG 24 hr tablet Take 1 tablet (50 mg total) by mouth daily. 90 tablet 3   MYRBETRIQ  50 MG TB24 tablet Take 50 mg by mouth daily.     ondansetron  (ZOFRAN ) 4 MG tablet Take 1 tablet (4 mg total) by mouth every 6 (six) hours as needed for nausea. 20 tablet 0   osimertinib  mesylate (TAGRISSO ) 80 MG tablet TAKE 1 TABLET BY MOUTH DAILY. 30 tablet 2   POTASSIUM PO Take 1 tablet by mouth daily.     PREMARIN vaginal cream Place 1 applicator vaginally as needed (urinary issues).     RABEprazole  (ACIPHEX ) 20 MG tablet Take 1 tablet (20 mg total) by mouth 2  (two) times a week. (Patient taking differently: Take 20 mg by mouth every other day.)     rosuvastatin  (CRESTOR ) 5 MG tablet Take 1 tablet (5 mg total) by mouth daily. 90 tablet 3   Tiotropium Bromide -Olodaterol 2.5-2.5 MCG/ACT AERS Inhale 2 puffs into the lungs daily. 4 g 5   triamcinolone  cream (KENALOG ) 0.1 % Apply 1 Application topically 2 (two) times daily. As needed for itching/rash (Patient taking differently: Apply 1 Application topically as needed (rash/itching).) 453.6 g 0   vitamin B-12 (CYANOCOBALAMIN ) 1000 MCG tablet Take 1,000 mcg by mouth daily.     No current facility-administered medications for this visit.    Past Medical History:  Diagnosis Date   Anemia    as a child   Anxiety    Asthma 10/19/2020   Bursitis    Chronic reflux esophagitis    Complication of anesthesia    Diarrhea, functional    Diverticulosis    GERD (gastroesophageal reflux disease)    History of kidney stones    Hypertension    Knee pain    right knee-seeing ortho   lung ca 09/2019   Osteoarthritis    Plantar fasciitis    Pneumonia    PONV (postoperative nausea and vomiting)    has used the patch before and that helps   Pure hypercholesterolemia    RLS (restless legs syndrome)    Superficial vein thrombosis    Thyroid  disease    hypothyroid    Past Surgical History:  Procedure Laterality Date   APPENDECTOMY     BRONCHIAL BIOPSY  10/20/2019   Procedure: BRONCHIAL BIOPSIES;  Surgeon: Denson Flake, MD;  Location: [facility] ENDOSCOPY;  Service: Pulmonary;;   BRONCHIAL BRUSHINGS  10/20/2019   Procedure: BRONCHIAL BRUSHINGS;  Surgeon: Denson Flake, MD;  Location: [facility] ENDOSCOPY;  Service: Pulmonary;;   BRONCHIAL NEEDLE ASPIRATION BIOPSY  10/20/2019   Procedure: BRONCHIAL NEEDLE ASPIRATION BIOPSIES;  Surgeon: Denson Flake, MD;  Location: [facility] ENDOSCOPY;  Service: Pulmonary;;   BRONCHIAL WASHINGS  10/20/2019   Procedure: BRONCHIAL WASHINGS;  Surgeon: Denson Flake, MD;  Location: [facility]  ENDOSCOPY;  Service: Pulmonary;;   CARPAL TUNNEL RELEASE Right 2011   Dr. Aloha Arnold   COLPORRHAPHY     posterior   HAND SURGERY Right 2016   Nerve surgery, Dr. Aloha Arnold   OVARIAN CYST REMOVAL     RECTOCELE REPAIR  2011   w/TVH and sling   TONSILLECTOMY     TONSILLECTOMY     TOTAL KNEE ARTHROPLASTY Left 09/09/2018   Procedure: TOTAL KNEE ARTHROPLASTY, CORTISONE INJECTION RIGHT KNEE;  Surgeon: Claiborne Crew, MD;  Location: [facility] ORS;  Service: Orthopedics;  Laterality: Left;  70 mins  TOTAL KNEE ARTHROPLASTY Right 04/16/2023   Procedure: TOTAL KNEE ARTHROPLASTY;  Surgeon: Claiborne Crew, MD;  Location: [facility] ORS;  Service: Orthopedics;  Laterality: Right;   TOTAL VAGINAL HYSTERECTOMY  10/18/2009   rectocele repair, sling   TUBAL LIGATION Bilateral    VARICOSE VEIN SURGERY     VIDEO BRONCHOSCOPY WITH ENDOBRONCHIAL NAVIGATION N/A 10/20/2019   Procedure: VIDEO BRONCHOSCOPY WITH ENDOBRONCHIAL NAVIGATION;  Surgeon: Denson Flake, MD;  Location: [facility] ENDOSCOPY;  Service: Pulmonary;  Laterality: N/A;    Family History  Problem Relation Age of Onset   Cervical cancer Mother    Heart failure Mother    Heart failure Father    Diabetes Father    Cancer Brother    Breast cancer Maternal Aunt    Breast cancer Paternal Aunt     Allergies as of 06/11/2023   (No Known Allergies)    Social History   Socioeconomic History   Marital status: Married    Spouse name: Not on file   Number of children: 3   Years of education: Not on file   Highest education level: Not on file  Occupational History    Employer: SELF EMPLOYED  Tobacco Use   Smoking status: Never   Smokeless tobacco: Never  Vaping Use   Vaping status: Never Used  Substance and Sexual Activity   Alcohol use: Yes    Alcohol/week: 1.0 standard drink of alcohol    Types: 1 Glasses of wine per week   Drug use: No   Sexual activity: Not Currently    Partners: Male    Birth control/protection: Surgical    Comment: Hysterectomy, BTL   Other Topics Concern   Not on file  Social History Narrative   Married, retired Armed forces operational officer   Caffeine- coffee, 1 cup, occass tea   Education- BS   Children- 3   Social Drivers of Corporate investment banker Strain: Not on file  Food Insecurity: No Food Insecurity (05/26/2023)   Hunger Vital Sign    Worried About Running Out of Food in the Last Year: Never true    Ran Out of Food in the Last Year: Never true  Transportation Needs: No Transportation Needs (05/26/2023)   PRAPARE - Administrator, Civil Service (Medical): No    Lack of Transportation (Non-Medical): No  Physical Activity: Not on file  Stress: Not on file  Social Connections: Unknown (05/26/2023)   Social Connection and Isolation Panel [NHANES]    Frequency of Communication with Friends and Family: Patient unable to answer    Frequency of Social Gatherings with Friends and Family: Patient unable to answer    Attends Religious Services: Patient unable to answer    Active Member of Clubs or Organizations: Patient unable to answer    Attends Banker Meetings: Patient unable to answer    Marital Status: Married  Catering manager Violence: Not At Risk (05/26/2023)   Humiliation, Afraid, Rape, and Kick questionnaire    Fear of Current or Ex-Partner: No    Emotionally Abused: No    Physically Abused: No    Sexually Abused: No     Review of Systems   Gen: Denies any fever, chills, fatigue, weight loss, lack of appetite.  CV: Denies chest pain, heart palpitations, peripheral edema, syncope.  Resp: Denies shortness of breath at rest or with exertion. Denies wheezing or cough. *** GI: see HPI GU : Denies urinary burning, urinary frequency, urinary hesitancy MS: Denies joint pain, muscle weakness,  cramps, or limitation of movement.  Derm: Denies rash, itching, dry skin Psych: Denies depression, anxiety, memory loss, and confusion Heme: Denies bruising, bleeding, and enlarged lymph  nodes.  Physical Exam   LMP 08/21/2002   General:   Alert and oriented. Pleasant and cooperative. Well-nourished and well-developed.  Head:  Normocephalic and atraumatic. Eyes:  Without icterus, sclera clear and conjunctiva pink.  Ears:  Normal auditory acuity. Mouth:  No deformity or lesions, oral mucosa pink.  Lungs:  Clear to auscultation bilaterally. No wheezes, rales, or rhonchi. No distress.  Heart:  S1, S2 present without murmurs appreciated.  Abdomen:  +BS, soft, non-tender and non-distended. No HSM noted. No guarding or rebound. No masses appreciated.  Rectal:  Deferred *** Msk:  Symmetrical without gross deformities. Normal posture. Extremities:  Without edema. Neurologic:  Alert and  oriented x4;  grossly normal neurologically. Skin:  Intact without significant lesions or rashes. Psych:  Alert and cooperative. Normal mood and affect.  Assessment   Linda Woods is a 73 y.o. female with a history of lung cancer with concern for metastatic lesions on the spine, follows with oncology, chronic knee pain s/p knee replacement in February, GERD, HTN, asthma, anxiety, anemia, hypothyroidism, and diverticulosis presenting today for evaluation of ***     PLAN   ***    Julian Obey, MSN, FNP-BC, AGACNP-BC [facility]

## 2023-06-11 ENCOUNTER — Other Ambulatory Visit: Payer: Self-pay

## 2023-06-11 ENCOUNTER — Ambulatory Visit: Admitting: Gastroenterology

## 2023-06-11 ENCOUNTER — Other Ambulatory Visit (HOSPITAL_COMMUNITY): Payer: Self-pay

## 2023-06-11 DIAGNOSIS — K449 Diaphragmatic hernia without obstruction or gangrene: Secondary | ICD-10-CM | POA: Diagnosis not present

## 2023-06-11 DIAGNOSIS — K810 Acute cholecystitis: Secondary | ICD-10-CM | POA: Diagnosis not present

## 2023-06-11 DIAGNOSIS — I119 Hypertensive heart disease without heart failure: Secondary | ICD-10-CM | POA: Diagnosis not present

## 2023-06-11 DIAGNOSIS — K5730 Diverticulosis of large intestine without perforation or abscess without bleeding: Secondary | ICD-10-CM | POA: Diagnosis not present

## 2023-06-12 ENCOUNTER — Other Ambulatory Visit: Payer: Self-pay

## 2023-06-12 ENCOUNTER — Other Ambulatory Visit (HOSPITAL_COMMUNITY): Payer: Self-pay

## 2023-06-12 ENCOUNTER — Telehealth: Payer: Self-pay | Admitting: *Deleted

## 2023-06-12 ENCOUNTER — Encounter: Payer: Self-pay | Admitting: Surgery

## 2023-06-12 ENCOUNTER — Ambulatory Visit: Admitting: Surgery

## 2023-06-12 VITALS — BP 108/70 | HR 83 | Temp 97.5°F | Resp 12 | Ht 64.0 in | Wt 144.0 lb

## 2023-06-12 DIAGNOSIS — Z8719 Personal history of other diseases of the digestive system: Secondary | ICD-10-CM

## 2023-06-12 DIAGNOSIS — K8050 Calculus of bile duct without cholangitis or cholecystitis without obstruction: Secondary | ICD-10-CM

## 2023-06-12 DIAGNOSIS — G8929 Other chronic pain: Secondary | ICD-10-CM | POA: Diagnosis not present

## 2023-06-12 DIAGNOSIS — M545 Low back pain, unspecified: Secondary | ICD-10-CM | POA: Diagnosis not present

## 2023-06-12 DIAGNOSIS — M25561 Pain in right knee: Secondary | ICD-10-CM | POA: Diagnosis not present

## 2023-06-12 DIAGNOSIS — Z139 Encounter for screening, unspecified: Secondary | ICD-10-CM

## 2023-06-12 DIAGNOSIS — M25661 Stiffness of right knee, not elsewhere classified: Secondary | ICD-10-CM | POA: Diagnosis not present

## 2023-06-12 MED ORDER — ONDANSETRON HCL 4 MG PO TABS: 4.0000 mg | ORAL_TABLET | Freq: Four times a day (QID) | ORAL | 0 refills | Status: DC | PRN

## 2023-06-12 NOTE — Progress Notes (Unsigned)
 [facility] Note   HPI:  [name] presents to clinic for post-op follow-up after hospitalization with abdominal pain and possible concern for acute cholecystitis.  Since being discharged home from the hospital, she denies any abdominal pain.  She is still having her chronic back pain which she was having prior to her hospital admission.  She will occasionally get nauseous at night, and does not tolerate meat/greasy foods secondary to nausea.  She denies fevers and chills, and she feels that she is getting stronger every day.  Her breathing has significantly improved as well.  Her surgical history is significant for an exploratory laparotomy when she was 17 for an ovarian cyst and some type of uterine sling procedure for uterine prolapse several years ago.  She is currently on Tagrisso  for her non-small cell lung cancer.  Review of Systems:  All other review of systems: otherwise negative   Vital Signs:  BP 108/70   Pulse 83   Temp (!) 97.5 F (36.4 C) (Oral)   Resp 12   Ht 5\' 4"  (1.626 m)   Wt 144 lb (65.3 kg)   LMP 08/21/2002   SpO2 93%   BMI 24.72 kg/m    Physical Exam:  Physical Exam Vitals reviewed.  Constitutional:      Appearance: Normal appearance.  HENT:     Head: Normocephalic and atraumatic.  Eyes:     Extraocular Movements: Extraocular movements intact.     Pupils: Pupils are equal, round, and reactive to light.  Cardiovascular:     Rate and Rhythm: Normal rate and regular rhythm.  Pulmonary:     Effort: Pulmonary effort is normal.     Breath sounds: Normal breath sounds.  Abdominal:     Comments: Abdomen soft, nondistended, no percussion tenderness, nontender to palpation; no rigidity, guarding, rebound tenderness; negative Murphy sign  Musculoskeletal:        General: Normal range of motion.     Cervical back: Normal range of motion.  Skin:    General: Skin is warm and dry.  Neurological:     General: No focal deficit present.      Mental Status: She is alert and oriented to person, place, and time.  Psychiatric:        Mood and Affect: Mood normal.        Behavior: Behavior normal.     Laboratory studies: None  Imaging:  CT abdomen and pelvis (/5/25): IMPRESSION: 1. Acute cholecystitis. 2. Moderate hiatal hernia. Fluid-filled distal esophagus suggesting changes of gastroesophageal reflux. 3. Moderate distal colonic diverticulosis without superimposed acute inflammatory change. 4. Stable sclerotic lesions within the spine at T11, L5, and S2 compatible with treated metastatic disease.   Aortic Atherosclerosis (ICD10-I70.0).  Abdominal ultrasound (05/27/2023): IMPRESSION: Dilated gallbladder with some sludge. No shadowing stones or further sonographic evidence of acute cholecystitis. The gallbladder wall thickening and adjacent fluid on the prior CT scan is not well seen on the current ultrasound. Further workup evaluation as clinically appropriate such as HIDA scan or MRI.   No biliary ductal dilatation.  HIDA scan (/8/25): IMPRESSION: Normal hepatobiliary scan.  Assessment:  73 y.o. yo Female who presents for follow-up status post hospitalization for abdominal pain concern for possible acute cholecystitis.  Plan:  -We reviewed the patient's imaging while she was inpatient, and I explained to her that the ultrasound and HIDA scan were not consistent for acute cholecystitis.  However, the ultrasound did note some biliary sludge, which could result in some of  her biliary colic like symptoms.  Given her respiratory improvement since being discharged from the hospital, patient is now appropriate to undergo surgical intervention for her gallbladder -I counseled the patient about the indication, risks and benefits of robotic assisted laparoscopic cholecystectomy.  She understands there is a very small chance for bleeding, infection, injury to normal structures (including common bile duct), conversion to open  surgery, persistent symptoms, evolution of postcholecystectomy diarrhea, need for secondary interventions, anesthesia reaction, cardiopulmonary issues and other risks not specifically detailed here. I described the expected recovery, the plan for follow-up and the restrictions during the recovery phase.  All questions were answered. -We will reach out to the patient's oncologist to ask how long her Tagrisso  needs to be held preoperatively -We will plan to schedule her for surgery once we speak with the oncologist -Advised that she needs to present to the emergency department if she begins to have worsening right upper quadrant abdominal pain, nausea, vomiting, fever, and chills -Prescription provided for Zofran  -We will also refer the patient to GI for evaluation of GERD, and for screening colonoscopy, as she is due for her colonoscopy this year  All of the above recommendations were discussed with the patient and patient's family, and all of patient's and family's questions were answered to their expressed satisfaction.  Note: Portions of this report may have been transcribed using voice recognition software. Every effort has been made to ensure accuracy; however, inadvertent computerized transcription errors may still be present.   Lidia Reels, DO [facility] [address] [place], [place] [address] [phone] (office)

## 2023-06-12 NOTE — Progress Notes (Signed)
 Specialty Pharmacy Refill Coordination Note  Linda Woods is a 73 y.o. female contacted today regarding refills of specialty medication(s) Tagrisso .  Patient requested (Patient-Rptd) Delivery   Delivery date: (Patient-Rptd) 06/17/23   Verified address: (Patient-Rptd) [address] [place], [place]   Medication will be filled on 06/14/23.

## 2023-06-12 NOTE — Telephone Encounter (Signed)
 [facility] Pre-Operative Assessment  General Surgeon:  Bynum Cassis Pappayliou, DO  Type of surgery being performed: XI Robotic Assisted Laparoscopic Cholecystectomy   Anesthesia Type: (none, local, MAC, general)  General  Scheduled Date:  TBD (May 2025)   Type of Clearance Required:  (medical vs pharmacology)  Pharmacology: Tagrisso - should medication be held prior to general anesthesia? If so, how long?    **CONFIDENTIALITY NOTICE** This message is intended only for the use of the individual or entity to which it is addressed, and may contain information that is privileged, confidential, and exempt from disclosure under applicable law. If the reader of this message is not the intended recipient, you are hereby notified that any dissemination, distribution, or copying of this communication is strictly prohibited. If you have received this communication in error, please notify our Compliance & Privacy Helpline at [phone].

## 2023-06-13 DIAGNOSIS — M545 Low back pain, unspecified: Secondary | ICD-10-CM | POA: Diagnosis not present

## 2023-06-13 DIAGNOSIS — K810 Acute cholecystitis: Secondary | ICD-10-CM | POA: Diagnosis not present

## 2023-06-13 DIAGNOSIS — Z6824 Body mass index (BMI) 24.0-24.9, adult: Secondary | ICD-10-CM | POA: Diagnosis not present

## 2023-06-13 NOTE — Telephone Encounter (Signed)
Call placed to patient. LMTRC.  

## 2023-06-14 ENCOUNTER — Other Ambulatory Visit: Payer: Self-pay

## 2023-06-14 DIAGNOSIS — G8929 Other chronic pain: Secondary | ICD-10-CM | POA: Diagnosis not present

## 2023-06-14 DIAGNOSIS — M25661 Stiffness of right knee, not elsewhere classified: Secondary | ICD-10-CM | POA: Diagnosis not present

## 2023-06-14 DIAGNOSIS — M545 Low back pain, unspecified: Secondary | ICD-10-CM | POA: Diagnosis not present

## 2023-06-14 DIAGNOSIS — M25561 Pain in right knee: Secondary | ICD-10-CM | POA: Diagnosis not present

## 2023-06-14 NOTE — Telephone Encounter (Signed)
 Call placed to patient.   Patient agreeable to procedure date of 06/28/2023. Made aware of medication hold per oncology.   Patient stated that PCP requested her to be kept overnight after surgery due to the patient is high risk due to lung cancer.   Dr. Cherilyn Corn discussed with Dr. Glady Laming. Will plan for outpatient, but will evaluate need for overnight admission based on patient tolerance of anesthesia.   Call placed to patient. LMTRC.

## 2023-06-14 NOTE — Telephone Encounter (Signed)
 Patient returned call and made aware.

## 2023-06-17 DIAGNOSIS — Z96651 Presence of right artificial knee joint: Secondary | ICD-10-CM | POA: Diagnosis not present

## 2023-06-18 DIAGNOSIS — G8929 Other chronic pain: Secondary | ICD-10-CM | POA: Diagnosis not present

## 2023-06-18 DIAGNOSIS — M545 Low back pain, unspecified: Secondary | ICD-10-CM | POA: Diagnosis not present

## 2023-06-20 DIAGNOSIS — Z801 Family history of malignant neoplasm of trachea, bronchus and lung: Secondary | ICD-10-CM | POA: Diagnosis not present

## 2023-06-20 DIAGNOSIS — S32010D Wedge compression fracture of first lumbar vertebra, subsequent encounter for fracture with routine healing: Secondary | ICD-10-CM | POA: Diagnosis not present

## 2023-06-20 DIAGNOSIS — G8929 Other chronic pain: Secondary | ICD-10-CM | POA: Diagnosis not present

## 2023-06-24 ENCOUNTER — Telehealth: Payer: Self-pay | Admitting: Medical Oncology

## 2023-06-24 ENCOUNTER — Encounter: Payer: Self-pay | Admitting: Physical Medicine and Rehabilitation

## 2023-06-24 NOTE — Telephone Encounter (Signed)
 Pt called and said her MRI result is very concerning . She is crying on the phone. She is waiting to hear about a possible kyphoplasty for her lumbar fracture.    Is there anything Dr Marguerita Shih needs to do ?   Her CT scan and f/u is in May.

## 2023-06-25 ENCOUNTER — Encounter (HOSPITAL_COMMUNITY)
Admission: RE | Admit: 2023-06-25 | Discharge: 2023-06-25 | Disposition: A | Discharge status: Home / Self Care | Source: Ambulatory Visit | Attending: Surgery | Admitting: Surgery

## 2023-06-25 ENCOUNTER — Encounter (HOSPITAL_COMMUNITY): Payer: Self-pay

## 2023-06-25 ENCOUNTER — Telehealth: Payer: Self-pay | Admitting: Medical Oncology

## 2023-06-25 ENCOUNTER — Other Ambulatory Visit: Payer: Self-pay

## 2023-06-25 NOTE — Telephone Encounter (Signed)
 Per Dr. Marguerita Shih , I told Evoleth he said it is ok to proceeds with kyphoplasty.   Spoke to [employer] ( IR) and she said IR provider will do a bx during procedure. Bridgette [facility] will call pt with instructions ,appt etc.

## 2023-06-26 ENCOUNTER — Telehealth: Payer: Self-pay | Admitting: *Deleted

## 2023-06-26 NOTE — Telephone Encounter (Signed)
 Surgical Date: 06/28/2023 Procedure: XI Robotic Assisted Laparoscopic Cholecystectomy  Received call from patient [phone]~ telephone   Patient reports burning with urination, urinary frequency, and flank pain. States that she contacted PCP and was given ABTx Cipro  for UTI. Inquired if this will delay cholecystectomy. Advised from surgical standpoint, there is no contraindication. Patient does not need to re-schedule unless she wants to due to discomfort. Patient would prefer to continue as scheduled.   Patient also reports that she has been able to schedule injection to back for the following week. Inquired if there will be any issues with receiving the injection of cement in to fracture after lap chole. Advised that patient should contact orthopedic office or other office performing injection.

## 2023-06-27 ENCOUNTER — Other Ambulatory Visit (HOSPITAL_COMMUNITY): Payer: Self-pay | Admitting: Interventional Radiology

## 2023-06-27 DIAGNOSIS — S32010D Wedge compression fracture of first lumbar vertebra, subsequent encounter for fracture with routine healing: Secondary | ICD-10-CM

## 2023-06-28 ENCOUNTER — Ambulatory Visit (HOSPITAL_COMMUNITY): Admitting: Certified Registered Nurse Anesthetist

## 2023-06-28 ENCOUNTER — Ambulatory Visit (HOSPITAL_BASED_OUTPATIENT_CLINIC_OR_DEPARTMENT_OTHER): Admitting: Certified Registered Nurse Anesthetist

## 2023-06-28 ENCOUNTER — Encounter (HOSPITAL_COMMUNITY)
Admission: RE | Disposition: A | Discharge status: Home / Self Care | Payer: Self-pay | Source: Home / Self Care | Attending: Surgery

## 2023-06-28 ENCOUNTER — Ambulatory Visit (HOSPITAL_COMMUNITY)
Admission: RE | Admit: 2023-06-28 | Discharge: 2023-06-28 | Disposition: A | Discharge status: Home / Self Care | Attending: Surgery | Admitting: Surgery

## 2023-06-28 DIAGNOSIS — K219 Gastro-esophageal reflux disease without esophagitis: Secondary | ICD-10-CM | POA: Diagnosis not present

## 2023-06-28 DIAGNOSIS — J4489 Other specified chronic obstructive pulmonary disease: Secondary | ICD-10-CM | POA: Diagnosis not present

## 2023-06-28 DIAGNOSIS — I10 Essential (primary) hypertension: Secondary | ICD-10-CM | POA: Diagnosis not present

## 2023-06-28 DIAGNOSIS — G8929 Other chronic pain: Secondary | ICD-10-CM | POA: Insufficient documentation

## 2023-06-28 DIAGNOSIS — C3490 Malignant neoplasm of unspecified part of unspecified bronchus or lung: Secondary | ICD-10-CM | POA: Diagnosis not present

## 2023-06-28 DIAGNOSIS — Z79899 Other long term (current) drug therapy: Secondary | ICD-10-CM | POA: Diagnosis not present

## 2023-06-28 DIAGNOSIS — K8044 Calculus of bile duct with chronic cholecystitis without obstruction: Secondary | ICD-10-CM

## 2023-06-28 DIAGNOSIS — K8050 Calculus of bile duct without cholangitis or cholecystitis without obstruction: Secondary | ICD-10-CM

## 2023-06-28 DIAGNOSIS — K811 Chronic cholecystitis: Secondary | ICD-10-CM | POA: Insufficient documentation

## 2023-06-28 DIAGNOSIS — C3492 Malignant neoplasm of unspecified part of left bronchus or lung: Secondary | ICD-10-CM

## 2023-06-28 SURGERY — CHOLECYSTECTOMY, ROBOT-ASSISTED, LAPAROSCOPIC
Anesthesia: General | Site: Abdomen

## 2023-06-28 MED ORDER — PROPOFOL 500 MG/50ML IV EMUL
INTRAVENOUS | Status: AC
  Filled 2023-06-28: qty 50

## 2023-06-28 MED ORDER — CHLORHEXIDINE GLUCONATE CLOTH 2 % EX PADS
6.0000 | MEDICATED_PAD | Freq: Once | CUTANEOUS | Status: DC
Start: 2023-06-28 — End: 2023-06-28

## 2023-06-28 MED ORDER — SCOPOLAMINE 1 MG/3DAYS TD PT72SCOPOLAMINE 1 MG/3DAYS
MEDICATED_PATCH | TRANSDERMAL | Status: DC | PRN
Start: 2023-06-28 — End: 2023-06-28
  Administered 2023-06-28: 1 via TRANSDERMAL

## 2023-06-28 MED ORDER — ONDANSETRON HCL 4 MG/2ML IJ SOLN
INTRAMUSCULAR | Status: DC | PRN
Start: 2023-06-28 — End: 2023-06-28
  Administered 2023-06-28: 4 mg via INTRAVENOUS

## 2023-06-28 MED ORDER — MIDAZOLAM HCL 2 MG/2ML IJ SOLN
INTRAMUSCULAR | Status: DC | PRN
Start: 2023-06-28 — End: 2023-06-28
  Administered 2023-06-28: 1 mg via INTRAVENOUS

## 2023-06-28 MED ORDER — LIDOCAINE 2% (20 MG/ML) 5 ML SYRINGE
INTRAMUSCULAR | Status: AC
  Filled 2023-06-28: qty 5

## 2023-06-28 MED ORDER — ONDANSETRON HCL 4 MG/2ML IJ SOLN
INTRAMUSCULAR | Status: AC
  Filled 2023-06-28: qty 2

## 2023-06-28 MED ORDER — PROPOFOL 10 MG/ML IV BOLUS
INTRAVENOUS | Status: DC | PRN
Start: 2023-06-28 — End: 2023-06-28
  Administered 2023-06-28: 20 mg via INTRAVENOUS
  Administered 2023-06-28: 150 ug/kg/min via INTRAVENOUS
  Administered 2023-06-28: 100 ug/kg/min via INTRAVENOUS
  Administered 2023-06-28: 120 mg via INTRAVENOUS

## 2023-06-28 MED ORDER — PROPOFOL 10 MG/ML IV BOLUS
INTRAVENOUS | Status: AC
Start: 2023-06-28 — End: ?
  Filled 2023-06-28: qty 20

## 2023-06-28 MED ORDER — ONDANSETRON HCL 4 MG PO TABS: 4.0000 mg | ORAL_TABLET | Freq: Four times a day (QID) | ORAL | 0 refills | Status: DC | PRN

## 2023-06-28 MED ORDER — ROCURONIUM BROMIDE 10 MG/ML (PF) SYRINGE
PREFILLED_SYRINGE | INTRAVENOUS | Status: AC
  Filled 2023-06-28: qty 10

## 2023-06-28 MED ORDER — OXYCODONE HCL 5 MG/5ML PO SOLN: 5.0000 mg | Freq: Once | ORAL | Status: DC | PRN

## 2023-06-28 MED ORDER — OSIMERTINIB MESYLATE 80 MG PO TABS: ORAL_TABLET | Freq: Every day | ORAL | Status: DC

## 2023-06-28 MED ORDER — MIDAZOLAM HCL 2 MG/2ML IJ SOLN
INTRAMUSCULAR | Status: AC
  Filled 2023-06-28: qty 2

## 2023-06-28 MED ORDER — CHLORHEXIDINE GLUCONATE 0.12 % MT SOLN
OROMUCOSAL | Status: AC
  Filled 2023-06-28: qty 15

## 2023-06-28 MED ORDER — SUCCINYLCHOLINE CHLORIDE 200 MG/10ML IV SOSY
PREFILLED_SYRINGE | INTRAVENOUS | Status: AC
  Filled 2023-06-28: qty 10

## 2023-06-28 MED ORDER — FENTANYL CITRATE (PF) 100 MCG/2ML IJ SOLN
INTRAMUSCULAR | Status: AC
  Filled 2023-06-28: qty 2

## 2023-06-28 MED ORDER — LACTATED RINGERS IV SOLN: INTRAVENOUS | Status: DC | PRN

## 2023-06-28 MED ORDER — OXYCODONE HCL 5 MG PO TABS: 5.0000 mg | ORAL_TABLET | Freq: Once | ORAL | Status: DC | PRN

## 2023-06-28 MED ORDER — LACTATED RINGERS IV SOLN: INTRAVENOUS | Status: DC

## 2023-06-28 MED ORDER — ACETAMINOPHEN 10 MG/ML IV SOLN
INTRAVENOUS | Status: AC
  Filled 2023-06-28: qty 100

## 2023-06-28 MED ORDER — BUPIVACAINE HCL (PF) 0.5 % IJ SOLN
INTRAMUSCULAR | Status: AC
  Filled 2023-06-28: qty 30

## 2023-06-28 MED ORDER — SCOPOLAMINE 1 MG/3DAYS TD PT72
MEDICATED_PATCH | TRANSDERMAL | Status: AC
  Filled 2023-06-28: qty 1

## 2023-06-28 MED ORDER — SUGAMMADEX SODIUM 200 MG/2ML IV SOLN
INTRAVENOUS | Status: DC | PRN
  Administered 2023-06-28: 20 mg via INTRAVENOUS
  Administered 2023-06-28: 30 mg via INTRAVENOUS
  Administered 2023-06-28: 20 mg via INTRAVENOUS
  Administered 2023-06-28: 30 mg via INTRAVENOUS
  Administered 2023-06-28: 20 mg via INTRAVENOUS
  Administered 2023-06-28: 30 mg via INTRAVENOUS

## 2023-06-28 MED ORDER — ACETAMINOPHEN 500 MG PO TABS: 1000.0000 mg | ORAL_TABLET | Freq: Four times a day (QID) | ORAL | 0 refills | Status: AC

## 2023-06-28 MED ORDER — DEXAMETHASONE SODIUM PHOSPHATE 10 MG/ML IJ SOLN
INTRAMUSCULAR | Status: DC | PRN
  Administered 2023-06-28: 10 mg via INTRAVENOUS

## 2023-06-28 MED ORDER — SUCCINYLCHOLINE CHLORIDE 200 MG/10ML IV SOSY
PREFILLED_SYRINGE | INTRAVENOUS | Status: DC | PRN
  Administered 2023-06-28: 100 mg via INTRAVENOUS

## 2023-06-28 MED ORDER — DEXAMETHASONE SODIUM PHOSPHATE 10 MG/ML IJ SOLN
INTRAMUSCULAR | Status: AC
  Filled 2023-06-28: qty 1

## 2023-06-28 MED ORDER — ORAL CARE MOUTH RINSE: 15.0000 mL | Freq: Once | OROMUCOSAL | Status: DC

## 2023-06-28 MED ORDER — ONDANSETRON HCL 4 MG/2ML IJ SOLN: 4.0000 mg | Freq: Once | INTRAMUSCULAR | Status: DC | PRN

## 2023-06-28 MED ORDER — INDOCYANINE GREEN 25 MG IV SOLR
INTRAVENOUS | Status: DC
Start: 2023-06-28 — End: 2023-06-28
  Filled 2023-06-28: qty 10

## 2023-06-28 MED ORDER — BUPIVACAINE HCL (PF) 0.5 % IJ SOLN
INTRAMUSCULAR | Status: DC | PRN
  Administered 2023-06-28: 30 mL

## 2023-06-28 MED ORDER — CHLORHEXIDINE GLUCONATE 0.12 % MT SOLN: 15.0000 mL | Freq: Once | OROMUCOSAL | Status: DC

## 2023-06-28 MED ORDER — INDOCYANINE GREEN 25 MG IV SOLR
2.5000 mg | Freq: Once | INTRAVENOUS | Status: AC
  Administered 2023-06-28: 2.5 mg via INTRAVENOUS

## 2023-06-28 MED ORDER — SODIUM CHLORIDE 0.9 % IV SOLN
2.0000 g | INTRAVENOUS | Status: AC
  Administered 2023-06-28: 2 g via INTRAVENOUS
  Filled 2023-06-28: qty 2

## 2023-06-28 MED ORDER — LIDOCAINE 2% (20 MG/ML) 5 ML SYRINGE
INTRAMUSCULAR | Status: DC | PRN
  Administered 2023-06-28: 60 mg via INTRAVENOUS

## 2023-06-28 MED ORDER — STERILE WATER FOR IRRIGATION IR SOLN
Status: DC | PRN
  Administered 2023-06-28: 500 mL

## 2023-06-28 MED ORDER — FENTANYL CITRATE (PF) 100 MCG/2ML IJ SOLN
INTRAMUSCULAR | Status: DC | PRN
  Administered 2023-06-28 (×2): 50 ug via INTRAVENOUS

## 2023-06-28 MED ORDER — INDOCYANINE GREEN 25 MG IV SOLR
INTRAVENOUS | Status: DC | PRN
  Administered 2023-06-28: 2.5 mg via INTRAVENOUS

## 2023-06-28 MED ORDER — ROCURONIUM BROMIDE 10 MG/ML (PF) SYRINGE
PREFILLED_SYRINGE | INTRAVENOUS | Status: DC | PRN
  Administered 2023-06-28: 5 mg via INTRAVENOUS
  Administered 2023-06-28: 10 mg via INTRAVENOUS
  Administered 2023-06-28: 55 mg via INTRAVENOUS

## 2023-06-28 MED ORDER — DEXMEDETOMIDINE HCL IN NACL 80 MCG/20ML IV SOLN
INTRAVENOUS | Status: DC | PRN
Start: 2023-06-28 — End: 2023-06-28
  Administered 2023-06-28: 4 ug via INTRAVENOUS
  Administered 2023-06-28: 8 ug via INTRAVENOUS

## 2023-06-28 MED ORDER — PHENYLEPHRINE 80 MCG/ML (10ML) SYRINGE FOR IV PUSH (FOR BLOOD PRESSURE SUPPORT)
PREFILLED_SYRINGE | INTRAVENOUS | Status: DC | PRN
  Administered 2023-06-28: 160 ug via INTRAVENOUS
  Administered 2023-06-28: 80 ug via INTRAVENOUS
  Administered 2023-06-28 (×2): 160 ug via INTRAVENOUS

## 2023-06-28 MED ORDER — DOCUSATE SODIUM 100 MG PO CAPS: 100.0000 mg | ORAL_CAPSULE | Freq: Two times a day (BID) | ORAL | 2 refills | Status: DC

## 2023-06-28 MED ORDER — PHENYLEPHRINE 80 MCG/ML (10ML) SYRINGE FOR IV PUSH (FOR BLOOD PRESSURE SUPPORT)
PREFILLED_SYRINGE | INTRAVENOUS | Status: AC
  Filled 2023-06-28: qty 20

## 2023-06-28 MED ORDER — FENTANYL CITRATE PF 50 MCG/ML IJ SOSY
25.0000 ug | PREFILLED_SYRINGE | INTRAMUSCULAR | Status: DC | PRN
  Administered 2023-06-28: 50 ug via INTRAVENOUS
  Filled 2023-06-28: qty 1

## 2023-06-28 MED ORDER — ACETAMINOPHEN 10 MG/ML IV SOLN
INTRAVENOUS | Status: DC | PRN
  Administered 2023-06-28: 1000 mg via INTRAVENOUS

## 2023-06-28 SURGICAL SUPPLY — 36 items
BLADE SURG 15 STRL LF DISP TIS (BLADE) ×1 IMPLANT
CAUTERY HOOK MNPLR 1.6 DVNC XI (INSTRUMENTS) ×1 IMPLANT
CHLORAPREP W/TINT 26 (MISCELLANEOUS) ×1 IMPLANT
CLIP LIGATING HEM O LOK PURPLE (MISCELLANEOUS) ×1 IMPLANT
DEFOGGER SCOPE WARMER CLEARIFY (MISCELLANEOUS) ×1 IMPLANT
DERMABOND ADVANCED .7 DNX12 (GAUZE/BANDAGES/DRESSINGS) ×1 IMPLANT
DRAPE ARM DVNC X/XI (DISPOSABLE) ×4 IMPLANT
DRAPE COLUMN DVNC XI (DISPOSABLE) ×1 IMPLANT
ELECTRODE REM PT RTRN 9FT ADLT (ELECTROSURGICAL) ×1 IMPLANT
FORCEPS BPLR R/ABLATION 8 DVNC (INSTRUMENTS) ×1 IMPLANT
FORCEPS PROGRASP DVNC XI (FORCEP) ×1 IMPLANT
GLOVE BIOGEL PI IND STRL 6.5 (GLOVE) ×2 IMPLANT
GLOVE BIOGEL PI IND STRL 7.0 (GLOVE) ×3 IMPLANT
GLOVE SURG SS PI 6.5 STRL IVOR (GLOVE) ×2 IMPLANT
GOWN STRL REUS W/TWL LRG LVL3 (GOWN DISPOSABLE) ×3 IMPLANT
GRASPER SUT TROCAR 14GX15 (MISCELLANEOUS) ×1 IMPLANT
KIT TURNOVER KIT A (KITS) ×1 IMPLANT
MANIFOLD NEPTUNE II (INSTRUMENTS) ×1 IMPLANT
NDL HYPO 21X1.5 SAFETY (NEEDLE) ×1 IMPLANT
NDL INSUFFLATION 14GA 120MM (NEEDLE) ×1 IMPLANT
NEEDLE HYPO 21X1.5 SAFETY (NEEDLE) ×1 IMPLANT
NEEDLE INSUFFLATION 14GA 120MM (NEEDLE) ×1 IMPLANT
OBTURATOR OPTICALSTD 8 DVNC (TROCAR) ×1 IMPLANT
PACK LAP CHOLE LZT030E (CUSTOM PROCEDURE TRAY) ×1 IMPLANT
PAD ARMBOARD POSITIONER FOAM (MISCELLANEOUS) ×1 IMPLANT
PENCIL HANDSWITCHING (ELECTRODE) ×1 IMPLANT
POSITIONER HEAD 8X9X4 ADT (SOFTGOODS) ×1 IMPLANT
SEAL UNIV 5-12 XI (MISCELLANEOUS) ×4 IMPLANT
SET BASIN LINEN APH (SET/KITS/TRAYS/PACK) ×1 IMPLANT
SET TUBE SMOKE EVAC HIGH FLOW (TUBING) ×1 IMPLANT
SUT MNCRL AB 4-0 PS2 18 (SUTURE) ×2 IMPLANT
SUT VICRYL 0 UR6 27IN ABS (SUTURE) ×1 IMPLANT
SYR 30ML LL (SYRINGE) ×1 IMPLANT
SYSTEM RETRIEVL 5MM INZII UNIV (BASKET) ×1 IMPLANT
TROCAR Z-THREAD FIOS 5X100MM (TROCAR) IMPLANT
WATER STERILE IRR 500ML POUR (IV SOLUTION) ×1 IMPLANT

## 2023-06-28 NOTE — Op Note (Signed)
 [facility] Operative Note  06/28/23  Preoperative Diagnosis: Biliary colic   Postoperative Diagnosis: Same   Procedure(s) Performed: Robotic Assisted Laparoscopic Cholecystectomy   Surgeon: Lidia Reels, DO   Assistants: Donivan Furry, MS3    Anesthesia: General endotracheal   Anesthesiologist: Coretha Dew, MD    Specimens: Gallbladder   Estimated Blood Loss: Minimal   Blood Replacement: None    Complications: None   Wound Class: Clean contaminated   Operative Indications: The patient was admitted to the hospital with imaging concerning for acute cholecystitis.  She subsequently had an acute COPD exacerbation.  She was evaluated with other imaging studies including ultrasound which demonstrated distention of the gallbladder and sludge without definitive stones and a HIDA scan which demonstrated patency of the cystic duct.  She was discharged home and followed up with me outpatient.  She now would like to proceed with cholecystectomy.  We discussed the risk of the procedure including but not limited to bleeding, infection, injury to the common bile duct, bile leak, need for further procedures, chance of subtotal cholecystectomy.   Findings:  Chronic cholecystitis Critical view of safety noted All clips intact at the end of the case Adequate hemostasis   Procedure: Firefly was given in the preoperative area. The patient was taken to the operating room and placed supine. General endotracheal anesthesia was induced. Intravenous antibiotics were administered per protocol.  An orogastric tube positioned to decompress the stomach. The abdomen was prepared and draped in the usual sterile fashion. A time-out was completed verifying correct patient, procedure, site, positioning, and implant(s) and/or special equipment prior to beginning this procedure.  Veress needle was placed at Palmer's point and insufflation was started after confirming a positive saline  drop test and no immediate increase in abdominal pressure.  After reaching 15 mm, the Veress needle was removed and a 5 mm port was placed via optiview technique at Palmer's point.  The abdomen was inspected and no abnormalities or injuries were found.  Once she was demonstrated that she did not have lower abdominal intra-abdominal adhesions, an 8 mm port was placed under direct visualization infraumbilically, measuring 20 mm away from the suspected position of the gallbladder.  Under direct vision, ports were placed in the following locations in a semi curvilinear position around the target of the gallbladder: Two 8 mm ports on the patient's right each having 8cm clearance to the adjacent ports and one 8 mm port placed on the patient's left 8 cm from the umbilical port. Once ports were placed, the table was placed in the reverse Trendelenburg position with the right side up. The Xi platform was brought into the operative field and docked to the ports successfully.  An endoscope was placed through the umbilical port, prograsp through the most lateral right port, fenestrated bipolar to the port just right of the umbilicus, and then a hook cautery in the left port.  The dome of the gallbladder was grasped with prograsp and retracted over the dome of the liver. Adhesions between the gallbladder and omentum, duodenum and transverse colon were lysed via hook cautery. The infundibulum was grasped with the fenestrated grasper and retracted toward the right lower quadrant. This maneuver exposed Calot's triangle. Firefly was used throughout the dissection to ensure safe visualization of the cystic duct.  The peritoneum overlying the gallbladder infundibulum was then dissected and the cystic duct and cystic artery identified.  Critical view of safety with the liver bed clearly visible behind the duct and artery with  no additional structures noted.  The cystic duct and cystic artery were doubly clipped and divided close to  the gallbladder.    The gallbladder was then dissected from its peritoneal and liver bed attachments by electrocautery. Hemostasis was checked prior to removing the hook cautery.  The Tracie Fried was undocked and moved out of the field.  A 5mm Endo Catch bag was then placed through the umbilical port and the gallbladder was removed.  The gallbladder was passed off the table as a specimen. There was no evidence of bleeding from the gallbladder fossa or cystic artery or leakage of the bile from the cystic duct stump. The umbilical port site closed with a 0 vicryl with a PMI needle.  The abdomen was desufflated and secondary trocars were removed under direct vision. No bleeding was noted. Incisions were localized with marcaine .  All skin incisions were closed with subcuticular sutures of 4-0 monocryl and dermabond.   Final inspection revealed acceptable hemostasis. All counts were correct at the end of the case. The patient was awakened from anesthesia and extubated without complication. The OG tube was removed.  The patient went to the PACU in stable condition.   Lidia Reels, DO [facility] [address] [place], [place] [address] [phone] (office)

## 2023-06-28 NOTE — Discharge Instructions (Addendum)
 Ambulatory Surgery Discharge Instructions  General Anesthesia or Sedation Do not drive or operate heavy machinery for 24 hours.  Do not consume alcohol, tranquilizers, sleeping medications, or any non-prescribed medications for 24 hours. Do not make important decisions or sign any important papers in the next 24 hours. You should have someone with you tonight at home.  Activity  You are advised to go directly home from the hospital.  Restrict your activities and rest for a day.  Resume light activity tomorrow. No heavy lifting over 10 lbs or strenuous exercise.  Fluids and Diet Begin with clear liquids, bouillon, dry toast, soda crackers.  If not nauseated, you may go to a regular diet when you desire.  Greasy and spicy foods are not advised.  Medications  If you have not had a bowel movement in 24 hours, take 2 tablespoons over the counter Milk of mag.             You May resume your blood thinners tomorrow (Aspirin, coumadin, or other).  You are being discharged with prescriptions for Opioid/Narcotic Medications: There are some specific considerations for these medications that you should know. Opioid Meds have risks & benefits. Addiction to these meds is always a concern with prolonged use Take medication only as directed Do not drive while taking narcotic pain medication Do not crush tablets or capsules Do not use a different container than medication was dispensed in Lock the container of medication in a cool, dry place out of reach of children and pets. Opioid medication can cause addiction Do not share with anyone else (this is a felony) Do not store medications for future use. Dispose of them properly.     Disposal:  Find a [employer] household drug take back site near you.  If you can't get to a drug take back site, use the recipe below as a last resort to dispose of expired, unused or unwanted drugs. Disposal  (Do not dispose chemotherapy drugs this way, talk to your  prescribing doctor instead.) Step 1: Mix drugs (do not crush) with dirt, kitty litter, or used coffee grounds and add a small amount of water to dissolve any solid medications. Step 2: Seal drugs in plastic bag. Step 3: Place plastic bag in trash. Step 4: Take prescription container and scratch out personal information, then recycle or throw away.  Operative Site  You have a liquid bandage over your incisions, this will begin to flake off in about a week. Ok to English as a second language teacher. Keep wound clean and dry. No baths or swimming. No lifting more than 10 pounds.  Contact Information: If you have questions or concerns, please call our office, [phone], Monday- Thursday 8AM-5PM and Friday 8AM-12Noon.  If it is after hours or on the weekend, please call [facility] Main Number, [phone], and ask to speak to the surgeon on call for Dr. Robyne Peers at [facility].   SPECIFIC COMPLICATIONS TO WATCH FOR: Inability to urinate Fever over 101? F by mouth Nausea and vomiting lasting longer than 24 hours. Pain not relieved by medication ordered Swelling around the operative site Increased redness, warmth, hardness, around operative area Numbness, tingling, or cold fingers or toes Blood -soaked dressing, (small amounts of oozing may be normal) Increasing and progressive drainage from surgical area or exam site

## 2023-06-28 NOTE — Transfer of Care (Signed)
 Immediate Anesthesia Transfer of Care Note  Patient: Linda Woods  Procedure(s) Performed: CHOLECYSTECTOMY, ROBOT-ASSISTED, LAPAROSCOPIC; IC GREEN IMAGING. (Abdomen)  Patient Location: PACU  Anesthesia Type:General  Level of Consciousness: awake, alert , oriented, and patient cooperative  Airway & Oxygen Therapy: Patient Spontanous Breathing and Patient connected to nasal cannula oxygen  Post-op Assessment: Report given to RN and Post -op Vital signs reviewed and stable  Post vital signs: Reviewed and stable  Last Vitals:  Vitals Value Taken Time  BP 99/49 06/28/23 1138  Temp 36.4 C 06/28/23 1138  Pulse 99 06/28/23 1141  Resp 16 06/28/23 1141  SpO2 97 % 06/28/23 1141  Vitals shown include unfiled device data.  Last Pain:  Vitals:   06/28/23 0855  TempSrc: Oral  PainSc: 0-No pain      Patients Stated Pain Goal: 7 (06/28/23 0855)  Complications: No notable events documented.

## 2023-06-28 NOTE — Progress Notes (Signed)
Patient snoring 

## 2023-06-28 NOTE — H&P (Signed)
 [facility] History and Physical  Reason for Referral: Biliary colic/sludge Referring Physician: Hospital follow up  Linda Woods is a 73 y.o. female.  HPI: Patient presents to clinic for post-op follow-up after hospitalization with abdominal pain and possible concern for acute cholecystitis.  Since being discharged home from the hospital, she denies any abdominal pain.  She is still having her chronic back pain which she was having prior to her hospital admission.  She will occasionally get nauseous at night, and does not tolerate meat/greasy foods secondary to nausea.  She denies fevers and chills, and she feels that she is getting stronger every day.  Her breathing has significantly improved as well.  Her surgical history is significant for an exploratory laparotomy when she was 17 for an ovarian cyst and some type of uterine sling procedure for uterine prolapse several years ago.  She is currently on Tagrisso  for her non-small cell lung cancer.   Past Medical History:  Diagnosis Date   Anemia    as a child   Anxiety    Asthma 10/19/2020   Bursitis    Chronic reflux esophagitis    Complication of anesthesia    Diarrhea, functional    Diverticulosis    GERD (gastroesophageal reflux disease)    History of kidney stones    Hypertension    Knee pain    right knee-seeing ortho   lung ca 09/2019   Osteoarthritis    Plantar fasciitis    Pneumonia    PONV (postoperative nausea and vomiting)    has used the patch before and that helps   Pure hypercholesterolemia    RLS (restless legs syndrome)    Superficial vein thrombosis    Thyroid  disease    hypothyroid    Past Surgical History:  Procedure Laterality Date   APPENDECTOMY     BRONCHIAL BIOPSY  10/20/2019   Procedure: BRONCHIAL BIOPSIES;  Surgeon: Denson Flake, MD;  Location: [facility] ENDOSCOPY;  Service: Pulmonary;;   BRONCHIAL BRUSHINGS  10/20/2019   Procedure: BRONCHIAL BRUSHINGS;  Surgeon: Denson Flake, MD;  Location: [facility] ENDOSCOPY;  Service: Pulmonary;;   BRONCHIAL NEEDLE ASPIRATION BIOPSY  10/20/2019   Procedure: BRONCHIAL NEEDLE ASPIRATION BIOPSIES;  Surgeon: Denson Flake, MD;  Location: [facility] ENDOSCOPY;  Service: Pulmonary;;   BRONCHIAL WASHINGS  10/20/2019   Procedure: BRONCHIAL WASHINGS;  Surgeon: Denson Flake, MD;  Location: [facility] ENDOSCOPY;  Service: Pulmonary;;   CARPAL TUNNEL RELEASE Right 2011   Dr. Aloha Arnold   COLPORRHAPHY     posterior   HAND SURGERY Right 2016   Nerve surgery, Dr. Aloha Arnold   OVARIAN CYST REMOVAL     RECTOCELE REPAIR  2011   w/TVH and sling   TONSILLECTOMY     TONSILLECTOMY     TOTAL KNEE ARTHROPLASTY Left 09/09/2018   Procedure: TOTAL KNEE ARTHROPLASTY, CORTISONE INJECTION RIGHT KNEE;  Surgeon: Claiborne Crew, MD;  Location: [facility] ORS;  Service: Orthopedics;  Laterality: Left;  70 mins   TOTAL KNEE ARTHROPLASTY Right 04/16/2023   Procedure: TOTAL KNEE ARTHROPLASTY;  Surgeon: Claiborne Crew, MD;  Location: [facility] ORS;  Service: Orthopedics;  Laterality: Right;   TOTAL VAGINAL HYSTERECTOMY  10/18/2009   rectocele repair, sling   TUBAL LIGATION Bilateral    VARICOSE VEIN SURGERY     VIDEO BRONCHOSCOPY WITH ENDOBRONCHIAL NAVIGATION N/A 10/20/2019   Procedure: VIDEO BRONCHOSCOPY WITH ENDOBRONCHIAL NAVIGATION;  Surgeon: Denson Flake, MD;  Location: [facility] ENDOSCOPY;  Service: Pulmonary;  Laterality: N/A;  Family History  Problem Relation Age of Onset   Cervical cancer Mother    Heart failure Mother    Heart failure Father    Diabetes Father    Cancer Brother    Breast cancer Maternal Aunt    Breast cancer Paternal Aunt     Social History   Tobacco Use   Smoking status: Never   Smokeless tobacco: Never  Vaping Use   Vaping status: Never Used  Substance Use Topics   Alcohol use: Yes    Alcohol/week: 1.0 standard drink of alcohol    Types: 1 Glasses of wine per week   Drug use: No    Medications: I have reviewed the patient's current medications. Allergies  as of 06/28/2023   No Known Allergies     ROS:  Pertinent items are noted in HPI.  Blood pressure 112/65, pulse 88, temperature 97.6 F (36.4 C), temperature source Oral, resp. rate 12, height 5\' 4"  (1.626 m), weight 63 kg, last menstrual period 08/21/2002, SpO2 100%. Physical Exam Vitals reviewed.  Constitutional:      Appearance: Normal appearance.  HENT:     Head: Normocephalic and atraumatic.  Eyes:     Extraocular Movements: Extraocular movements intact.     Pupils: Pupils are equal, round, and reactive to light.  Cardiovascular:     Rate and Rhythm: Normal rate and regular rhythm.  Pulmonary:     Effort: Pulmonary effort is normal.     Breath sounds: Normal breath sounds.  Abdominal:     Comments: Abdomen soft, nondistended, no percussion tenderness, nontender to palpation; no rigidity, guarding, rebound tenderness; negative Murphy sign  Musculoskeletal:        General: Normal range of motion.     Cervical back: Normal range of motion.  Skin:    General: Skin is warm and dry.  Neurological:     General: No focal deficit present.     Mental Status: She is alert and oriented to person, place, and time.  Psychiatric:        Mood and Affect: Mood normal.        Behavior: Behavior normal.   Results: CT abdomen and pelvis (/5/25): IMPRESSION: 1. Acute cholecystitis. 2. Moderate hiatal hernia. Fluid-filled distal esophagus suggesting changes of gastroesophageal reflux. 3. Moderate distal colonic diverticulosis without superimposed acute inflammatory change. 4. Stable sclerotic lesions within the spine at T11, L5, and S2 compatible with treated metastatic disease.   Aortic Atherosclerosis (ICD10-I70.0).   Abdominal ultrasound (05/27/2023): IMPRESSION: Dilated gallbladder with some sludge. No shadowing stones or further sonographic evidence of acute cholecystitis. The gallbladder wall thickening and adjacent fluid on the prior CT scan is not well seen on the current  ultrasound. Further workup evaluation as clinically appropriate such as HIDA scan or MRI.   No biliary ductal dilatation.   HIDA scan (/8/25): IMPRESSION: Normal hepatobiliary scan.   Assessment:  73 y.o. yo Female who presents for follow-up status post hospitalization for abdominal pain concern for possible acute cholecystitis.   Plan:  -We reviewed the patient's imaging while she was inpatient, and I explained to her that the ultrasound and HIDA scan were not consistent for acute cholecystitis.  However, the ultrasound did note some biliary sludge, which could result in some of her biliary colic like symptoms.  Given her respiratory improvement since being discharged from the hospital, patient is now appropriate to undergo surgical intervention for her gallbladder -I counseled the patient about the indication, risks and benefits of robotic assisted  laparoscopic cholecystectomy.  She understands there is a very small chance for bleeding, infection, injury to normal structures (including common bile duct), conversion to open surgery, persistent symptoms, evolution of postcholecystectomy diarrhea, need for secondary interventions, anesthesia reaction, cardiopulmonary issues and other risks not specifically detailed here. I described the expected recovery, the plan for follow-up and the restrictions during the recovery phase.  All questions were answered. -We discussed her Tagrisso  with oncology, who recommended holding the medication for 2 days prior to and after surgery -Plan for surgery today -Advised that she needs to present to the emergency department if she begins to have worsening right upper quadrant abdominal pain, nausea, vomiting, fever, and chills   All of the above recommendations were discussed with the patient and patient's family, and all of patient's and family's questions were answered to their expressed satisfaction.  Note: Portions of this report may have been transcribed  using voice recognition software. Every effort has been made to ensure accuracy; however, inadvertent computerized transcription errors may still be present.   Lidia Reels, DO [facility] [address] [place], [place] [address] [phone] (office)

## 2023-06-28 NOTE — Anesthesia Postprocedure Evaluation (Signed)
 Anesthesia Post Note  Patient: Linda Woods  Procedure(s) Performed: CHOLECYSTECTOMY, ROBOT-ASSISTED, LAPAROSCOPIC; IC GREEN IMAGING. (Abdomen)  Patient location during evaluation: Phase II Anesthesia Type: General Level of consciousness: awake Pain management: pain level controlled Vital Signs Assessment: post-procedure vital signs reviewed and stable Respiratory status: spontaneous breathing and respiratory function stable Cardiovascular status: blood pressure returned to baseline and stable Postop Assessment: no headache and no apparent nausea or vomiting Anesthetic complications: no Comments: Late entry   No notable events documented.   Last Vitals:  Vitals:   06/28/23 1243 06/28/23 1245  BP: 104/66 104/66  Pulse: 91 95  Resp: 14 13  Temp: 36.6 C   SpO2: 94% 94%    Last Pain:  Vitals:   06/28/23 1243  TempSrc: Oral  PainSc: 5                  Coretha Dew

## 2023-06-28 NOTE — Anesthesia Procedure Notes (Signed)
 Procedure Name: Intubation Date/Time: 06/28/2023 9:55 AM  Performed by: Sloan Duncans, CRNAPre-anesthesia Checklist: Patient identified, Emergency Drugs available, Suction available and Patient being monitored Patient Re-evaluated:Patient Re-evaluated prior to induction Oxygen Delivery Method: Circle system utilized Preoxygenation: Pre-oxygenation with 100% oxygen Induction Type: IV induction Laryngoscope Size: Glidescope (LoPro S3) Tube type: Oral Tube size: 7.0 mm Number of attempts: 1 Airway Equipment and Method: Stylet Placement Confirmation: ETT inserted through vocal cords under direct vision, positive ETCO2 and breath sounds checked- equal and bilateral Secured at: 21 cm Tube secured with: Tape Dental Injury: Teeth and Oropharynx as per pre-operative assessment  Comments: TMD 2, + emesis this morning, mask ventilation not attempted, [place] F1, vocal cords clear.

## 2023-06-28 NOTE — Progress Notes (Signed)
 [facility]  Spoke with the patient's husband in the consultation room.  I explained that she tolerated the procedure without difficulty.  She has dissolvable stitches under the skin with overlying skin glue.  This will flake off in 10 to 14 days.  She will take her oxycodone  as needed for pain, as she received this prescription previously for back pain.  I also want her taking scheduled Tylenol .  If they take the narcotic pain medication, they should take a stool softener as well.  The patient will follow-up with me in 2 weeks for phone follow-up.  All questions were answered to his expressed satisfaction.  Lidia Reels, DO [facility] [address] [place], [place] [address] [phone] (office)

## 2023-06-28 NOTE — Anesthesia Preprocedure Evaluation (Signed)
 Anesthesia Evaluation  Patient identified by MRN, date of birth, ID band Patient awake    Reviewed: Allergy & Precautions, H&P , NPO status , Patient's Chart, lab work & pertinent test results, reviewed documented beta blocker date and time   History of Anesthesia Complications (+) PONV and history of anesthetic complications  Airway Mallampati: II  TM Distance: >3 FB Neck ROM: full    Dental no notable dental hx.    Pulmonary asthma , pneumonia, COPD   Pulmonary exam normal breath sounds clear to auscultation       Cardiovascular Exercise Tolerance: Good hypertension,  Rhythm:regular Rate:Normal     Neuro/Psych   Anxiety     negative neurological ROS  negative psych ROS   GI/Hepatic Neg liver ROS,GERD  ,,  Endo/Other  Hypothyroidism    Renal/GU negative Renal ROS  negative genitourinary   Musculoskeletal   Abdominal   Peds  Hematology  (+) Blood dyscrasia, anemia   Anesthesia Other Findings   Reproductive/Obstetrics negative OB ROS                             Anesthesia Physical Anesthesia Plan  ASA: 2  Anesthesia Plan: General and General ETT   Post-op Pain Management:    Induction:   PONV Risk Score and Plan: Scopolamine patch - Pre-op and Ondansetron   Airway Management Planned:   Additional Equipment:   Intra-op Plan:   Post-operative Plan:   Informed Consent: I have reviewed the patients History and Physical, chart, labs and discussed the procedure including the risks, benefits and alternatives for the proposed anesthesia with the patient or authorized representative who has indicated his/her understanding and acceptance.     Dental Advisory Given  Plan Discussed with: CRNA  Anesthesia Plan Comments:        Anesthesia Quick Evaluation

## 2023-07-01 ENCOUNTER — Ambulatory Visit (HOSPITAL_COMMUNITY)
Admission: RE | Admit: 2023-07-01 | Discharge: 2023-07-01 | Disposition: A | Discharge status: Home / Self Care | Source: Ambulatory Visit | Attending: Internal Medicine | Admitting: Internal Medicine

## 2023-07-01 ENCOUNTER — Encounter: Payer: Self-pay | Admitting: Medical Oncology

## 2023-07-01 ENCOUNTER — Inpatient Hospital Stay: Attending: Internal Medicine

## 2023-07-01 DIAGNOSIS — Z9221 Personal history of antineoplastic chemotherapy: Secondary | ICD-10-CM | POA: Diagnosis not present

## 2023-07-01 DIAGNOSIS — R634 Abnormal weight loss: Secondary | ICD-10-CM | POA: Insufficient documentation

## 2023-07-01 DIAGNOSIS — C3412 Malignant neoplasm of upper lobe, left bronchus or lung: Secondary | ICD-10-CM | POA: Diagnosis not present

## 2023-07-01 DIAGNOSIS — Z79633 Long term (current) use of mitotic inhibitor: Secondary | ICD-10-CM | POA: Diagnosis not present

## 2023-07-01 DIAGNOSIS — K449 Diaphragmatic hernia without obstruction or gangrene: Secondary | ICD-10-CM | POA: Diagnosis not present

## 2023-07-01 DIAGNOSIS — Z923 Personal history of irradiation: Secondary | ICD-10-CM | POA: Diagnosis not present

## 2023-07-01 DIAGNOSIS — C801 Malignant (primary) neoplasm, unspecified: Secondary | ICD-10-CM | POA: Diagnosis not present

## 2023-07-01 DIAGNOSIS — C3490 Malignant neoplasm of unspecified part of unspecified bronchus or lung: Secondary | ICD-10-CM | POA: Diagnosis not present

## 2023-07-01 DIAGNOSIS — I700 Atherosclerosis of aorta: Secondary | ICD-10-CM | POA: Diagnosis not present

## 2023-07-01 LAB — CBC WITH DIFFERENTIAL (CANCER CENTER ONLY)
Abs Immature Granulocytes: 0.01 10*3/uL (ref 0.00–0.07)
Basophils Absolute: 0 10*3/uL (ref 0.0–0.1)
Basophils Relative: 1 %
Eosinophils Absolute: 0.2 10*3/uL (ref 0.0–0.5)
Eosinophils Relative: 3 %
HCT: 31.3 % — ABNORMAL LOW (ref 36.0–46.0)
Hemoglobin: 10.2 g/dL — ABNORMAL LOW (ref 12.0–15.0)
Immature Granulocytes: 0 %
Lymphocytes Relative: 18 %
Lymphs Abs: 1.1 10*3/uL (ref 0.7–4.0)
MCH: 29.6 pg (ref 26.0–34.0)
MCHC: 32.6 g/dL (ref 30.0–36.0)
MCV: 90.7 fL (ref 80.0–100.0)
Monocytes Absolute: 0.8 10*3/uL (ref 0.1–1.0)
Monocytes Relative: 13 %
Neutro Abs: 4 10*3/uL (ref 1.7–7.7)
Neutrophils Relative %: 65 %
Platelet Count: 178 10*3/uL (ref 150–400)
RBC: 3.45 MIL/uL — ABNORMAL LOW (ref 3.87–5.11)
RDW: 14.5 % (ref 11.5–15.5)
WBC Count: 6.1 10*3/uL (ref 4.0–10.5)
nRBC: 0 % (ref 0.0–0.2)

## 2023-07-01 LAB — CMP (CANCER CENTER ONLY)
ALT: 11 U/L (ref 0–44)
AST: 15 U/L (ref 15–41)
Albumin: 4 g/dL (ref 3.5–5.0)
Alkaline Phosphatase: 76 U/L (ref 38–126)
Anion gap: 6 (ref 5–15)
BUN: 11 mg/dL (ref 8–23)
CO2: 29 mmol/L (ref 22–32)
Calcium: 9.2 mg/dL (ref 8.9–10.3)
Chloride: 105 mmol/L (ref 98–111)
Creatinine: 0.88 mg/dL (ref 0.44–1.00)
GFR, Estimated: >60 mL/min
Glucose, Bld: 100 mg/dL — ABNORMAL HIGH (ref 70–99)
Potassium: 4.1 mmol/L (ref 3.5–5.1)
Sodium: 140 mmol/L (ref 135–145)
Total Bilirubin: 0.2 mg/dL (ref 0.0–1.2)
Total Protein: 6.2 g/dL — ABNORMAL LOW (ref 6.5–8.1)

## 2023-07-01 LAB — SURGICAL PATHOLOGY

## 2023-07-01 MED ORDER — IOHEXOL 300 MG/ML  SOLN
75.0000 mL | Freq: Once | INTRAMUSCULAR | Status: AC | PRN
Start: 2023-07-01 — End: 2023-07-01
  Administered 2023-07-01: 75 mL via INTRAVENOUS

## 2023-07-01 MED ORDER — SODIUM CHLORIDE (PF) 0.9 % IJ SOLN
INTRAMUSCULAR | Status: AC
  Filled 2023-07-01: qty 50

## 2023-07-02 ENCOUNTER — Other Ambulatory Visit (HOSPITAL_COMMUNITY): Payer: Self-pay | Admitting: Student

## 2023-07-02 DIAGNOSIS — C3490 Malignant neoplasm of unspecified part of unspecified bronchus or lung: Secondary | ICD-10-CM

## 2023-07-03 ENCOUNTER — Ambulatory Visit (HOSPITAL_COMMUNITY)
Admission: RE | Admit: 2023-07-03 | Discharge: 2023-07-03 | Disposition: A | Discharge status: Home / Self Care | Source: Ambulatory Visit | Attending: Interventional Radiology | Admitting: Interventional Radiology

## 2023-07-03 ENCOUNTER — Encounter (HOSPITAL_COMMUNITY): Payer: Self-pay

## 2023-07-03 DIAGNOSIS — M545 Low back pain, unspecified: Secondary | ICD-10-CM | POA: Insufficient documentation

## 2023-07-03 DIAGNOSIS — S32010D Wedge compression fracture of first lumbar vertebra, subsequent encounter for fracture with routine healing: Secondary | ICD-10-CM

## 2023-07-03 DIAGNOSIS — I10 Essential (primary) hypertension: Secondary | ICD-10-CM | POA: Diagnosis not present

## 2023-07-03 DIAGNOSIS — C3490 Malignant neoplasm of unspecified part of unspecified bronchus or lung: Secondary | ICD-10-CM

## 2023-07-03 DIAGNOSIS — M1990 Unspecified osteoarthritis, unspecified site: Secondary | ICD-10-CM | POA: Insufficient documentation

## 2023-07-03 DIAGNOSIS — Z85118 Personal history of other malignant neoplasm of bronchus and lung: Secondary | ICD-10-CM | POA: Insufficient documentation

## 2023-07-03 DIAGNOSIS — M4856XA Collapsed vertebra, not elsewhere classified, lumbar region, initial encounter for fracture: Secondary | ICD-10-CM | POA: Insufficient documentation

## 2023-07-03 DIAGNOSIS — M8088XA Other osteoporosis with current pathological fracture, vertebra(e), initial encounter for fracture: Secondary | ICD-10-CM | POA: Diagnosis not present

## 2023-07-03 LAB — CBC
HCT: 30.3 % — ABNORMAL LOW (ref 36.0–46.0)
Hemoglobin: 9.5 g/dL — ABNORMAL LOW (ref 12.0–15.0)
MCH: 28.7 pg (ref 26.0–34.0)
MCHC: 31.4 g/dL (ref 30.0–36.0)
MCV: 91.5 fL (ref 80.0–100.0)
Platelets: 161 10*3/uL (ref 150–400)
RBC: 3.31 MIL/uL — ABNORMAL LOW (ref 3.87–5.11)
RDW: 14.4 % (ref 11.5–15.5)
WBC: 2.5 10*3/uL — ABNORMAL LOW (ref 4.0–10.5)
nRBC: 0 % (ref 0.0–0.2)

## 2023-07-03 LAB — PROTIME-INR
INR: 1.2 (ref 0.8–1.2)
Prothrombin Time: 15.2 s (ref 11.4–15.2)

## 2023-07-03 LAB — BASIC METABOLIC PANEL WITH GFR
Anion gap: 7 (ref 5–15)
BUN: 10 mg/dL (ref 8–23)
CO2: 25 mmol/L (ref 22–32)
Calcium: 8.7 mg/dL — ABNORMAL LOW (ref 8.9–10.3)
Chloride: 106 mmol/L (ref 98–111)
Creatinine, Ser: 0.9 mg/dL (ref 0.44–1.00)
GFR, Estimated: >60 mL/min (ref >60)
Glucose, Bld: 135 mg/dL — ABNORMAL HIGH (ref 70–99)
Potassium: 3.9 mmol/L (ref 3.5–5.1)
Sodium: 138 mmol/L (ref 135–145)

## 2023-07-03 MED ORDER — MIDAZOLAM HCL 2 MG/2ML IJ SOLN
INTRAMUSCULAR | Status: AC | PRN
  Administered 2023-07-03 (×2): 1 mg via INTRAVENOUS

## 2023-07-03 MED ORDER — CEFAZOLIN SODIUM-DEXTROSE 2-4 GM/100ML-% IV SOLN
INTRAVENOUS | Status: AC
  Filled 2023-07-03: qty 100

## 2023-07-03 MED ORDER — FENTANYL CITRATE (PF) 100 MCG/2ML IJ SOLN
INTRAMUSCULAR | Status: AC | PRN
  Administered 2023-07-03 (×4): 25 ug via INTRAVENOUS

## 2023-07-03 MED ORDER — BUPIVACAINE HCL (PF) 0.5 % IJ SOLN
30.0000 mL | Freq: Once | INTRAMUSCULAR | Status: AC
Start: 2023-07-03 — End: 2023-07-03
  Administered 2023-07-03: 20 mL

## 2023-07-03 MED ORDER — MIDAZOLAM HCL 2 MG/2ML IJ SOLN
INTRAMUSCULAR | Status: AC
  Filled 2023-07-03: qty 2

## 2023-07-03 MED ORDER — BUPIVACAINE HCL (PF) 0.5 % IJ SOLN
INTRAMUSCULAR | Status: AC
  Filled 2023-07-03: qty 30

## 2023-07-03 MED ORDER — IOHEXOL 300 MG/ML  SOLN
50.0000 mL | Freq: Once | INTRAMUSCULAR | Status: AC | PRN
  Administered 2023-07-03: 20 mL

## 2023-07-03 MED ORDER — CEFAZOLIN SODIUM-DEXTROSE 2-4 GM/100ML-% IV SOLN
INTRAVENOUS | Status: AC | PRN
  Administered 2023-07-03: 2 g via INTRAVENOUS

## 2023-07-03 MED ORDER — FENTANYL CITRATE (PF) 100 MCG/2ML IJ SOLN
INTRAMUSCULAR | Status: AC
  Filled 2023-07-03: qty 2

## 2023-07-03 MED ORDER — TOBRAMYCIN SULFATE 1.2 G IJ SOLR
INTRAMUSCULAR | Status: AC
  Filled 2023-07-03: qty 1.2

## 2023-07-03 MED ORDER — CEFAZOLIN SODIUM-DEXTROSE 2-4 GM/100ML-% IV SOLN: 2.0000 g | INTRAVENOUS | Status: DC

## 2023-07-03 MED ORDER — SODIUM CHLORIDE 0.9 % IV SOLN: INTRAVENOUS | Status: AC

## 2023-07-03 NOTE — H&P (Signed)
 Chief Complaint: Lumbar one compression fracture; lumbar pain - image guided lumbar one kyphoplasty   Referring Provider(s): Vinson Grego   Supervising Physician: Luellen Sages  Patient Status: [facility] - Out-pt  History of Present Illness: Linda Woods is a 73 y.o. female with history of hypertension, osteoarthritis, stage IIIc non-small cell lung cancer, and lumbar one compression fracture.  Pt is followed by Dr. Ali Ink for her left adenocarcinoma of the upper lung that was initially diagnosed in August of 2021.  She has developed a compression fracture at lumber one that is believed to be from metastatic disease. She was referred to us  by Dr. Ali Ink for image guided lumbar one kyphoplasty.     Patient is Full Code  Past Medical History:  Diagnosis Date   Anemia    as a child   Anxiety    Asthma 10/19/2020   Bursitis    Chronic reflux esophagitis    Complication of anesthesia    Diarrhea, functional    Diverticulosis    GERD (gastroesophageal reflux disease)    History of kidney stones    Hypertension    Knee pain    right knee-seeing ortho   lung ca 09/2019   Osteoarthritis    Plantar fasciitis    Pneumonia    PONV (postoperative nausea and vomiting)    has used the patch before and that helps   Pure hypercholesterolemia    RLS (restless legs syndrome)    Superficial vein thrombosis    Thyroid  disease    hypothyroid    Past Surgical History:  Procedure Laterality Date   APPENDECTOMY     BRONCHIAL BIOPSY  10/20/2019   Procedure: BRONCHIAL BIOPSIES;  Surgeon: Denson Flake, MD;  Location: [facility] ENDOSCOPY;  Service: Pulmonary;;   BRONCHIAL BRUSHINGS  10/20/2019   Procedure: BRONCHIAL BRUSHINGS;  Surgeon: Denson Flake, MD;  Location: [facility] ENDOSCOPY;  Service: Pulmonary;;   BRONCHIAL NEEDLE ASPIRATION BIOPSY  10/20/2019   Procedure: BRONCHIAL NEEDLE ASPIRATION BIOPSIES;  Surgeon: Denson Flake, MD;  Location: [facility] ENDOSCOPY;  Service:  Pulmonary;;   BRONCHIAL WASHINGS  10/20/2019   Procedure: BRONCHIAL WASHINGS;  Surgeon: Denson Flake, MD;  Location: [facility] ENDOSCOPY;  Service: Pulmonary;;   CARPAL TUNNEL RELEASE Right 2011   Dr. Aloha Arnold   COLPORRHAPHY     posterior   HAND SURGERY Right 2016   Nerve surgery, Dr. Aloha Arnold   OVARIAN CYST REMOVAL     RECTOCELE REPAIR  2011   w/TVH and sling   TONSILLECTOMY     TONSILLECTOMY     TOTAL KNEE ARTHROPLASTY Left 09/09/2018   Procedure: TOTAL KNEE ARTHROPLASTY, CORTISONE INJECTION RIGHT KNEE;  Surgeon: Claiborne Crew, MD;  Location: [facility] ORS;  Service: Orthopedics;  Laterality: Left;  70 mins   TOTAL KNEE ARTHROPLASTY Right 04/16/2023   Procedure: TOTAL KNEE ARTHROPLASTY;  Surgeon: Claiborne Crew, MD;  Location: [facility] ORS;  Service: Orthopedics;  Laterality: Right;   TOTAL VAGINAL HYSTERECTOMY  10/18/2009   rectocele repair, sling   TUBAL LIGATION Bilateral    VARICOSE VEIN SURGERY     VIDEO BRONCHOSCOPY WITH ENDOBRONCHIAL NAVIGATION N/A 10/20/2019   Procedure: VIDEO BRONCHOSCOPY WITH ENDOBRONCHIAL NAVIGATION;  Surgeon: Denson Flake, MD;  Location: [facility] ENDOSCOPY;  Service: Pulmonary;  Laterality: N/A;    Allergies: Patient has no known allergies.  Medications: Prior to Admission medications   Medication Sig Start Date End Date Taking? Authorizing Provider  acetaminophen  (TYLENOL ) 500 MG tablet Take 2 tablets (1,000 mg total)  by mouth every 6 (six) hours for 7 days. 06/28/23 07/05/23 Yes Pappayliou, Arie Begin, DO  albuterol  (VENTOLIN  HFA) 108 (90 Base) MCG/ACT inhaler Inhale 2 puffs into the lungs every 6 (six) hours as needed for wheezing or shortness of breath. 01/21/23  Yes Wilhemena Harbour, NP  alum & mag hydroxide-simeth (MAALOX/MYLANTA) 200-200-20 MG/5ML suspension Take 15 mLs by mouth every 6 (six) hours as needed for indigestion or heartburn. 05/30/23  Yes Shahmehdi, Constantino Demark, MD  benzonatate  (TESSALON ) 100 MG capsule Take 1 capsule (100 mg total) by mouth 2 (two) times daily.  05/30/23  Yes Shahmehdi, Constantino Demark, MD  ciprofloxacin  (CIPRO ) 100 MG tablet Take 100 mg by mouth 2 (two) times daily.   Yes [provider]  docusate sodium  (COLACE) 100 MG capsule Take 1 capsule (100 mg total) by mouth 2 (two) times daily. 06/28/23 06/27/24 Yes Pappayliou, Arie Begin, DO  ferrous sulfate  325 (65 FE) MG tablet Take 325 mg by mouth daily with breakfast.   Yes [provider]  levothyroxine  (SYNTHROID ) 100 MCG tablet Take 1 tablet (100 mcg total) by mouth daily. One po qd 11/13/22  Yes Lillian Rein, MD  LORazepam  (ATIVAN ) 0.5 MG tablet Take 0.5 mg by mouth at bedtime.    Yes [provider]  melatonin 5 MG TABS Take 5 mg by mouth at bedtime as needed (sleep).   Yes [provider]  metoprolol  succinate (TOPROL -XL) 50 MG 24 hr tablet Take 1 tablet (50 mg total) by mouth daily. 01/02/23  Yes Strader, Dimple Francis, PA-C  MYRBETRIQ  50 MG TB24 tablet Take 50 mg by mouth daily. 02/08/21  Yes [provider]  ondansetron  (ZOFRAN ) 4 MG tablet Take 1 tablet (4 mg total) by mouth every 6 (six) hours as needed for nausea. 06/28/23  Yes Pappayliou, Bynum Cassis A, DO  osimertinib  mesylate (TAGRISSO ) 80 MG tablet TAKE 1 TABLET BY MOUTH DAILY. 06/30/23 06/29/24 Yes Pappayliou, Bynum Cassis A, DO  oxyCODONE  (OXY IR/ROXICODONE ) 5 MG immediate release tablet Take 5 mg by mouth every 6 (six) hours as needed for severe pain (pain score 7-10). 06/20/23  Yes [provider]  RABEprazole  (ACIPHEX ) 20 MG tablet Take 1 tablet (20 mg total) by mouth 2 (two) times a week. Patient taking differently: Take 20 mg by mouth every other day. 03/08/16  Yes Lillian Rein, MD  rosuvastatin  (CRESTOR ) 5 MG tablet Take 1 tablet (5 mg total) by mouth daily. 01/02/23  Yes Strader, [place] M, PA-C  Tiotropium Bromide -Olodaterol 2.5-2.5 MCG/ACT AERS Inhale 2 puffs into the lungs daily. 04/16/23  Yes Antonio Baumgarten, NP  vitamin B-12 (CYANOCOBALAMIN ) 1000 MCG tablet Take 1,000 mcg by mouth  daily.   Yes [provider]  chlorhexidine  (HIBICLENS ) 4 % external liquid Apply 15 mLs (1 Application total) topically as directed for 30 doses. Use as directed daily for 5 days every other week for 6 weeks. 04/16/23   Earnie Gola, PA-C  Cholecalciferol (VITAMIN D ) 50 MCG (2000 UT) tablet Take 2,000 Units by mouth daily.    [provider]  famotidine  (PEPCID ) 40 MG tablet Take 40 mg by mouth at bedtime.    [provider]  fexofenadine (ALLEGRA) 180 MG tablet Take 180 mg by mouth daily as needed for allergies.    [provider]  fluticasone  (FLONASE ) 50 MCG/ACT nasal spray Place 1 spray into both nostrils as needed for allergies. 10/01/19   [provider]  guaiFENesin -dextromethorphan  (ROBITUSSIN DM) 100-10 MG/5ML syrup Take 5 mLs by  mouth every 4 (four) hours as needed for cough (chest congestion). 05/30/23   Shahmehdi, Constantino Demark, MD  lidocaine  (LIDODERM ) 5 % Place 1 patch onto the skin daily. 04/26/20   [provider]  MAGNESIUM  PO Take 1 tablet by mouth daily.    [provider]  POTASSIUM PO Take 1 tablet by mouth daily.    [provider]  PREMARIN vaginal cream Place 1 applicator vaginally as needed (urinary issues). 12/19/21   [provider]  triamcinolone  cream (KENALOG ) 0.1 % Apply 1 Application topically 2 (two) times daily. As needed for itching/rash Patient taking differently: Apply 1 Application topically as needed (rash/itching). 07/20/22   Heilingoetter, Cassandra L, PA-C     Family History  Problem Relation Age of Onset   Cervical cancer Mother    Heart failure Mother    Heart failure Father    Diabetes Father    Cancer Brother    Breast cancer Maternal Aunt    Breast cancer Paternal Aunt     Social History   Socioeconomic History   Marital status: Married    Spouse name: Not on file   Number of children: 3   Years of education: Not on file   Highest education level: Not on file   Occupational History    Employer: SELF EMPLOYED  Tobacco Use   Smoking status: Never   Smokeless tobacco: Never  Vaping Use   Vaping status: Never Used  Substance and Sexual Activity   Alcohol use: Yes    Alcohol/week: 1.0 standard drink of alcohol    Types: 1 Glasses of wine per week   Drug use: No   Sexual activity: Not Currently    Partners: Male    Birth control/protection: Surgical    Comment: Hysterectomy, BTL  Other Topics Concern   Not on file  Social History Narrative   Married, retired Armed forces operational officer   Caffeine- coffee, 1 cup, occass tea   Education- BS   Children- 3   Social Drivers of Corporate investment banker Strain: Not on file  Food Insecurity: No Food Insecurity (05/26/2023)   Hunger Vital Sign    Worried About Running Out of Food in the Last Year: Never true    Ran Out of Food in the Last Year: Never true  Transportation Needs: No Transportation Needs (05/26/2023)   PRAPARE - Administrator, Civil Service (Medical): No    Lack of Transportation (Non-Medical): No  Physical Activity: Not on file  Stress: Not on file  Social Connections: Unknown (05/26/2023)   Social Connection and Isolation Panel [NHANES]    Frequency of Communication with Friends and Family: Patient unable to answer    Frequency of Social Gatherings with Friends and Family: Patient unable to answer    Attends Religious Services: Patient unable to answer    Active Member of Clubs or Organizations: Patient unable to answer    Attends Banker Meetings: Patient unable to answer    Marital Status: Married     Review of Systems: A 12 point ROS discussed and pertinent positives are indicated in the HPI above.  All other systems are negative.  Review of Systems  Constitutional:  Negative for chills, fatigue and fever.  Respiratory:  Negative for cough, shortness of breath and wheezing.   Gastrointestinal:  Negative for diarrhea, nausea and vomiting.  Neurological:   Negative for dizziness and headaches.  Psychiatric/Behavioral:  Negative for agitation, behavioral problems and confusion.  Vital Signs: BP 91/60   Pulse (!) 117   Temp 98.4 F (36.9 C) (Oral)   Resp 16   Ht 5\' 4"  (1.626 m)   Wt 139 lb (63 kg)   LMP 08/21/2002   SpO2 98%   BMI 23.86 kg/m   Advance Care Plan: The advanced care place/surrogate decision maker was discussed at the time of visit and the patient did not wish to discuss or was not able to name a surrogate decision maker or provide an advance care plan.  Physical Exam Constitutional:      Appearance: She is well-developed.  HENT:     Head: Atraumatic.     Mouth/Throat:     Mouth: Mucous membranes are moist.  Cardiovascular:     Rate and Rhythm: Normal rate and regular rhythm.     Heart sounds: No murmur heard. Pulmonary:     Effort: Pulmonary effort is normal.     Breath sounds: Normal breath sounds.  Abdominal:     General: Bowel sounds are normal.     Palpations: Abdomen is soft.  Musculoskeletal:        General: Normal range of motion.     Comments: Back pain, lumbar region   Skin:    General: Skin is warm.  Neurological:     Mental Status: She is alert and oriented to person, place, and time.  Psychiatric:        Mood and Affect: Mood normal.        Behavior: Behavior normal.     Imaging: No results found.  Labs:  CBC: Recent Labs    05/29/23 0449 05/30/23 0422 07/01/23 1223 07/03/23 0844  WBC 6.5 7.0 6.1 2.5*  HGB 8.6* 9.1* 10.2* 9.5*  HCT 28.2* 29.9* 31.3* 30.3*  PLT 154 176 178 161    COAGS: Recent Labs    07/03/23 0844  INR 1.2    BMP: Recent Labs    05/29/23 0449 05/30/23 0422 07/01/23 1223 07/03/23 0844  NA 139 137 140 138  K 4.4 3.7 4.1 3.9  CL 110 109 105 106  CO2 21* 21* 29 25  GLUCOSE 150* 136* 100* 135*  BUN 32* 29* 11 10  CALCIUM  8.6* 8.5* 9.2 8.7*  CREATININE 0.96 0.84 0.88 0.90  GFRNONAA >60 >60 >60 >60    LIVER FUNCTION TESTS: Recent Labs     05/25/23 2204 05/26/23 0441 05/30/23 0422 07/01/23 1223  BILITOT 0.6 0.6 0.4 0.2  AST 48* 35 18 15  ALT 44 38 17 11  ALKPHOS 129* 124 79 76  PROT 6.9 6.3* 5.3* 6.2*  ALBUMIN 3.9 3.6 2.6* 4.0    TUMOR MARKERS: No results for input(s): "AFPTM", "CEA", "CA199", "CHROMGRNA" in the last 8760 hours.  Assessment and Plan:  Pt is with lumbar one compression fracture and lumbar pain scheduled for image guided lumbar one kyphoplasty.    Risks and benefits of lumbar one kyphoplasty were discussed with the patient including, but not limited to education regarding the natural healing process of compression fractures without intervention, bleeding, infection, cement migration which may cause spinal cord damage, paralysis, pulmonary embolism or even death.  This interventional procedure involves the use of X-rays and because of the nature of the planned procedure, it is possible that we will have prolonged use of X-ray fluoroscopy.  Potential radiation risks to you include (but are not limited to) the following: - A slightly elevated risk for cancer  several years later in life. This risk is typically less  than 0.5% percent. This risk is low in comparison to the normal incidence of human cancer, which is 33% for women and 50% for men according to the American Cancer Society. - Radiation induced injury can include skin redness, resembling a rash, tissue breakdown / ulcers and hair loss (which can be temporary or permanent).   The likelihood of either of these occurring depends on the difficulty of the procedure and whether you are sensitive to radiation due to previous procedures, disease, or genetic conditions.   IF your procedure requires a prolonged use of radiation, you will be notified and given written instructions for further action.  It is your responsibility to monitor the irradiated area for the 2 weeks following the procedure and to notify your physician if you are concerned that you have  suffered a radiation induced injury.    All of the patient's questions were answered, patient is agreeable to proceed.  Consent signed and in chart.  Thank you for allowing our service to participate in Israh Helle 's care.  Electronically Signed: Pasty Bongo, PA-C   07/03/2023, 9:59 AM    I spent a total of  30 Minutes   in face to face in clinical consultation, greater than 50% of which was counseling/coordinating care for image guided lumbar one kyphoplasty.

## 2023-07-03 NOTE — Progress Notes (Signed)
 Patient and husband was given discharge instructions. Both verbalized understanding.

## 2023-07-03 NOTE — Procedures (Signed)
 INR.  S/P L1 balloon KP with biopsy.  No acute complications.  S.Tareka Jhaveri MD

## 2023-07-03 NOTE — Discharge Instructions (Signed)
 INR.  1.  No stooping bending or lifting weights above 10 pounds for 2 weeks.  2.  No driving for 2 weeks.  3.  Use a walker to ambulate for 2 weeks.  4.  Check with her referring MD the  results of the biopsy in 48 hours.  Jory Ng MD.

## 2023-07-09 ENCOUNTER — Other Ambulatory Visit: Payer: Self-pay | Admitting: Internal Medicine

## 2023-07-09 ENCOUNTER — Other Ambulatory Visit (HOSPITAL_COMMUNITY): Payer: Self-pay

## 2023-07-09 ENCOUNTER — Other Ambulatory Visit: Payer: Self-pay

## 2023-07-09 ENCOUNTER — Other Ambulatory Visit: Payer: Self-pay | Admitting: Pharmacy Technician

## 2023-07-09 DIAGNOSIS — C3492 Malignant neoplasm of unspecified part of left bronchus or lung: Secondary | ICD-10-CM

## 2023-07-09 MED ORDER — OSIMERTINIB MESYLATE 80 MG PO TABS
ORAL_TABLET | Freq: Every day | ORAL | 2 refills | Status: DC
  Filled 2023-07-09: qty 30, 30d supply, fill #0

## 2023-07-09 NOTE — Progress Notes (Signed)
 Specialty Pharmacy Refill Coordination Note  Linda Woods is a 73 y.o. female contacted today regarding refills of specialty medication(s) Osimertinib  Mesylate (TAGRISSO )   Patient requested (Patient-Rptd) Delivery   Delivery date: (Patient-Rptd) 12/30/50 New Delivery date 07/24/23   Verified address: (Patient-Rptd) [address] [place]   Medication will be filled on 07/23/23.   Called & Spoke with patient about Delivery date since DOB was entered. & always Sending Refill Request to MD.  This fill date is pending response to refill request from provider. Patient is aware and if they have not received fill by intended date they must follow up with pharmacy.

## 2023-07-10 ENCOUNTER — Inpatient Hospital Stay (HOSPITAL_BASED_OUTPATIENT_CLINIC_OR_DEPARTMENT_OTHER): Admitting: Internal Medicine

## 2023-07-10 VITALS — BP 108/68 | HR 106 | Temp 98.1°F | Resp 17 | Ht 64.0 in | Wt 139.3 lb

## 2023-07-10 DIAGNOSIS — C3490 Malignant neoplasm of unspecified part of unspecified bronchus or lung: Secondary | ICD-10-CM

## 2023-07-10 DIAGNOSIS — Z79633 Long term (current) use of mitotic inhibitor: Secondary | ICD-10-CM | POA: Diagnosis not present

## 2023-07-10 DIAGNOSIS — Z9221 Personal history of antineoplastic chemotherapy: Secondary | ICD-10-CM | POA: Diagnosis not present

## 2023-07-10 DIAGNOSIS — C3412 Malignant neoplasm of upper lobe, left bronchus or lung: Secondary | ICD-10-CM | POA: Diagnosis not present

## 2023-07-10 DIAGNOSIS — R634 Abnormal weight loss: Secondary | ICD-10-CM | POA: Diagnosis not present

## 2023-07-10 DIAGNOSIS — Z923 Personal history of irradiation: Secondary | ICD-10-CM | POA: Diagnosis not present

## 2023-07-10 LAB — SURGICAL PATHOLOGY

## 2023-07-10 NOTE — Progress Notes (Signed)
 [facility] [phone]   [phone]  OFFICE PROGRESS NOTE  Linda Amel, MD [address] [place] [address]  DIAGNOSIS: Stage IIIc (T3, N3, M0) non-small cell lung cancer, adenocarcinoma presented with large left upper lobe lung mass in addition to left infrahilar and right hilar and subcarinal lymphadenopathy diagnosed in August 2021.   Molecular Biomarkers: Insufficient tissue for foundation 1. Guardant 360 results.  DETECTED ALTERATION(S) / BIOMARKER(S) % CFDNA OR AMPLIFICATION ASSOCIATED FDA-APPROVED THERAPIES CLINICAL TRIAL AVAILABILITY  EGFR L858R, 1.2%, Afatinib, Dacomitinib, Erlotinib, Gefitinib, Osimertinib , Ramucirumab Yes [id number], 1.0%, Afatinib, Dacomitinib, Erlotinib, Gefitinib, Osimertinib , Ramucirumab Yes [id number] 1.0% None  No   PRIOR THERAPY: Weekly concurrent chemoradiation with carboplatin  for an AUC of 2 and paclitaxel  45 mg/m2.  First dose expected on 11/16/2019. Status post 7 cycles.   CURRENT THERAPY: Tagrisso  80 mg p.o. daily. First dose started 01/31/2020.  Status post 42 months of treatment.  INTERVAL HISTORY: Linda Woods 73 y.o. female returns to clinic today for follow-up visit accompanied by her friend. Discussed the use of AI scribe software for clinical note transcription with the patient, who gave verbal consent to proceed.  History of Present Illness   Linda Woods is a 73 year old female with stage III C non-small cell lung cancer who presents for evaluation and repeat CT scan of the chest.  She has a history of stage III C non-small cell lung cancer, adenocarcinoma with a positive EGFR mutation (exon 21, L858R). She completed a course of concurrent chemoradiation with weekly carboplatin  and paclitaxel  and has been on Tagrisso  80 mg PO daily since December 2021.  She has experienced significant weight loss of about 30 pounds since her last visit in April, where she weighed 144  pounds, and now weighs 139 pounds. She attributes this to a lack of appetite. No other gastrointestinal symptoms such as nausea or vomiting are present.  She is experiencing back pain, which she describes as different from the pain prior to her recent kyphoplasty at L2. She speculates that the pain might be due to manipulation during the procedure. A biopsy from the L2 area was performed, and no cancer was detected in the sample, although there was a concern about potential sampling issues.  Her past medical history includes a recent gallbladder removal and kyphoplasty. She plans to go on a cruise with her friend Bartholomew Light to [place] and [place].      MEDICAL HISTORY: Past Medical History:  Diagnosis Date   Anemia    as a child   Anxiety    Asthma 10/19/2020   Bursitis    Chronic reflux esophagitis    Complication of anesthesia    Diarrhea, functional    Diverticulosis    GERD (gastroesophageal reflux disease)    History of kidney stones    Hypertension    Knee pain    right knee-seeing ortho   lung ca 09/2019   Osteoarthritis    Plantar fasciitis    Pneumonia    PONV (postoperative nausea and vomiting)    has used the patch before and that helps   Pure hypercholesterolemia    RLS (restless legs syndrome)    Superficial vein thrombosis    Thyroid  disease    hypothyroid    ALLERGIES:  has no known allergies.  MEDICATIONS:  Current Outpatient Medications  Medication Sig Dispense Refill   albuterol  (VENTOLIN  HFA) 108 (90 Base) MCG/ACT inhaler Inhale 2 puffs into the lungs every 6 (  six) hours as needed for wheezing or shortness of breath. 8.5 g 0   alum & mag hydroxide-simeth (MAALOX/MYLANTA) 200-200-20 MG/5ML suspension Take 15 mLs by mouth every 6 (six) hours as needed for indigestion or heartburn. 355 mL 0   benzonatate  (TESSALON ) 100 MG capsule Take 1 capsule (100 mg total) by mouth 2 (two) times daily. 20 capsule 0   chlorhexidine  (HIBICLENS ) 4 % external liquid Apply 15  mLs (1 Application total) topically as directed for 30 doses. Use as directed daily for 5 days every other week for 6 weeks. 946 mL 1   Cholecalciferol (VITAMIN D ) 50 MCG (2000 UT) tablet Take 2,000 Units by mouth daily.     ciprofloxacin  (CIPRO ) 100 MG tablet Take 100 mg by mouth 2 (two) times daily.     docusate sodium  (COLACE) 100 MG capsule Take 1 capsule (100 mg total) by mouth 2 (two) times daily. 60 capsule 2   famotidine  (PEPCID ) 40 MG tablet Take 40 mg by mouth at bedtime.     ferrous sulfate  325 (65 FE) MG tablet Take 325 mg by mouth daily with breakfast.     fexofenadine (ALLEGRA) 180 MG tablet Take 180 mg by mouth daily as needed for allergies.     fluticasone  (FLONASE ) 50 MCG/ACT nasal spray Place 1 spray into both nostrils as needed for allergies.     guaiFENesin -dextromethorphan  (ROBITUSSIN DM) 100-10 MG/5ML syrup Take 5 mLs by mouth every 4 (four) hours as needed for cough (chest congestion). 118 mL 0   levothyroxine  (SYNTHROID ) 100 MCG tablet Take 1 tablet (100 mcg total) by mouth daily. One po qd 90 tablet 0   lidocaine  (LIDODERM ) 5 % Place 1 patch onto the skin daily.     LORazepam  (ATIVAN ) 0.5 MG tablet Take 0.5 mg by mouth at bedtime.      MAGNESIUM  PO Take 1 tablet by mouth daily.     melatonin 5 MG TABS Take 5 mg by mouth at bedtime as needed (sleep).     metoprolol  succinate (TOPROL -XL) 50 MG 24 hr tablet Take 1 tablet (50 mg total) by mouth daily. 90 tablet 3   MYRBETRIQ  50 MG TB24 tablet Take 50 mg by mouth daily.     ondansetron  (ZOFRAN ) 4 MG tablet Take 1 tablet (4 mg total) by mouth every 6 (six) hours as needed for nausea. 20 tablet 0   osimertinib  mesylate (TAGRISSO ) 80 MG tablet TAKE 1 TABLET BY MOUTH DAILY.     osimertinib  mesylate (TAGRISSO ) 80 MG tablet TAKE 1 TABLET BY MOUTH DAILY. 30 tablet 2   oxyCODONE  (OXY IR/ROXICODONE ) 5 MG immediate release tablet Take 5 mg by mouth every 6 (six) hours as needed for severe pain (pain score 7-10).     POTASSIUM PO Take 1  tablet by mouth daily.     PREMARIN vaginal cream Place 1 applicator vaginally as needed (urinary issues).     RABEprazole  (ACIPHEX ) 20 MG tablet Take 1 tablet (20 mg total) by mouth 2 (two) times a week. (Patient taking differently: Take 20 mg by mouth every other day.)     rosuvastatin  (CRESTOR ) 5 MG tablet Take 1 tablet (5 mg total) by mouth daily. 90 tablet 3   Tiotropium Bromide -Olodaterol 2.5-2.5 MCG/ACT AERS Inhale 2 puffs into the lungs daily. 4 g 5   triamcinolone  cream (KENALOG ) 0.1 % Apply 1 Application topically 2 (two) times daily. As needed for itching/rash (Patient taking differently: Apply 1 Application topically as needed (rash/itching).) 453.6 g 0   vitamin  B-12 (CYANOCOBALAMIN ) 1000 MCG tablet Take 1,000 mcg by mouth daily.     No current facility-administered medications for this visit.    SURGICAL HISTORY:  Past Surgical History:  Procedure Laterality Date   APPENDECTOMY     BRONCHIAL BIOPSY  10/20/2019   Procedure: BRONCHIAL BIOPSIES;  Surgeon: Denson Flake, MD;  Location: [facility] ENDOSCOPY;  Service: Pulmonary;;   BRONCHIAL BRUSHINGS  10/20/2019   Procedure: BRONCHIAL BRUSHINGS;  Surgeon: Denson Flake, MD;  Location: [facility] ENDOSCOPY;  Service: Pulmonary;;   BRONCHIAL NEEDLE ASPIRATION BIOPSY  10/20/2019   Procedure: BRONCHIAL NEEDLE ASPIRATION BIOPSIES;  Surgeon: Denson Flake, MD;  Location: [facility] ENDOSCOPY;  Service: Pulmonary;;   BRONCHIAL WASHINGS  10/20/2019   Procedure: BRONCHIAL WASHINGS;  Surgeon: Denson Flake, MD;  Location: [facility] ENDOSCOPY;  Service: Pulmonary;;   CARPAL TUNNEL RELEASE Right 2011   Dr. Aloha Arnold   COLPORRHAPHY     posterior   HAND SURGERY Right 2016   Nerve surgery, Dr. Aloha Arnold   IR KYPHO LUMBAR INC FX REDUCE BONE BX UNI/BIL CANNULATION INC/IMAGING  07/03/2023   OVARIAN CYST REMOVAL     RECTOCELE REPAIR  2011   w/TVH and sling   TONSILLECTOMY     TONSILLECTOMY     TOTAL KNEE ARTHROPLASTY Left 09/09/2018   Procedure: TOTAL KNEE ARTHROPLASTY,  CORTISONE INJECTION RIGHT KNEE;  Surgeon: Claiborne Crew, MD;  Location: [facility] ORS;  Service: Orthopedics;  Laterality: Left;  70 mins   TOTAL KNEE ARTHROPLASTY Right 04/16/2023   Procedure: TOTAL KNEE ARTHROPLASTY;  Surgeon: Claiborne Crew, MD;  Location: [facility] ORS;  Service: Orthopedics;  Laterality: Right;   TOTAL VAGINAL HYSTERECTOMY  10/18/2009   rectocele repair, sling   TUBAL LIGATION Bilateral    VARICOSE VEIN SURGERY     VIDEO BRONCHOSCOPY WITH ENDOBRONCHIAL NAVIGATION N/A 10/20/2019   Procedure: VIDEO BRONCHOSCOPY WITH ENDOBRONCHIAL NAVIGATION;  Surgeon: Denson Flake, MD;  Location: [facility] ENDOSCOPY;  Service: Pulmonary;  Laterality: N/A;    REVIEW OF SYSTEMS:  Constitutional: positive for anorexia, fatigue, and weight loss Eyes: negative Ears, nose, mouth, throat, and face: negative Respiratory: negative Cardiovascular: negative Gastrointestinal: negative Genitourinary:negative Integument/breast: negative Hematologic/lymphatic: negative Musculoskeletal:positive for arthralgias and back pain Neurological: negative Behavioral/Psych: negative Endocrine: negative Allergic/Immunologic: negative   PHYSICAL EXAMINATION: General appearance: alert, cooperative, fatigued, and no distress Head: Normocephalic, without obvious abnormality, atraumatic Neck: no adenopathy, no JVD, supple, symmetrical, trachea midline, and thyroid  not enlarged, symmetric, no tenderness/mass/nodules Lymph nodes: Cervical, supraclavicular, and axillary nodes normal. Resp: clear to auscultation bilaterally Back: symmetric, no curvature. ROM normal. No CVA tenderness. Cardio: regular rate and rhythm, S1, S2 normal, no murmur, click, rub or gallop GI: soft, non-tender; bowel sounds normal; no masses,  no organomegaly Extremities: extremities normal, atraumatic, no cyanosis or edema Neurologic: Alert and oriented X 3, normal strength and tone. Normal symmetric reflexes. Normal coordination and gait  ECOG PERFORMANCE  STATUS: 1 - Symptomatic but completely ambulatory  Blood pressure 108/68, pulse (!) 106, temperature 98.1 F (36.7 C), temperature source Temporal, resp. rate 17, height 5\' 4"  (1.626 m), weight 139 lb 4.8 oz (63.2 kg), last menstrual period 08/21/2002, SpO2 100%.  LABORATORY DATA: Lab Results  Component Value Date   WBC 2.5 (L) 07/03/2023   HGB 9.5 (L) 07/03/2023   HCT 30.3 (L) 07/03/2023   MCV 91.5 07/03/2023   PLT 161 07/03/2023      Chemistry      Component Value Date/Time   NA 138 07/03/2023 0844   K 3.9 07/03/2023  0844   CL 106 07/03/2023 0844   CO2 25 07/03/2023 0844   BUN 10 07/03/2023 0844   CREATININE 0.90 07/03/2023 0844   CREATININE 0.88 07/01/2023 1223      Component Value Date/Time   CALCIUM  8.7 (L) 07/03/2023 0844   ALKPHOS 76 07/01/2023 1223   AST 15 07/01/2023 1223   ALT 11 07/01/2023 1223   BILITOT 0.2 07/01/2023 1223       RADIOGRAPHIC STUDIES: IR KYPHO LUMBAR INC FX REDUCE BONE BX UNI/BIL CANNULATION INC/IMAGING Result Date: 07/04/2023 INDICATION: Severe low back pain secondary to compression fracture at L1. EXAM: KYPHOPLASTY AT L1 COMPARISON:  Recent MRI of the thoracolumbar spine. MEDICATIONS: As antibiotic prophylaxis, Ancef  2 g IV was ordered pre-procedure and administered intravenously within 1 hour of incision. All current medications are in the EMR and have been reviewed as part of this encounter. ANESTHESIA/SEDATION: Moderate (conscious) sedation was employed during this procedure. A total of Versed  2 mg and Fentanyl  100 mcg was administered intravenously by the radiology nurse. Total intra-service moderate Sedation Time: 54 minutes. The patient's level of consciousness and vital signs were monitored continuously by radiology nursing throughout the procedure under my direct supervision. FLUOROSCOPY: Radiation Exposure Index (as provided by the fluoroscopic device): 15 minutes 12 seconds 757 mGy Kerma COMPLICATIONS: None immediate. PROCEDURE: Following  a full explanation of the procedure along with the potential associated complications, an informed witnessed consent was obtained. The patient was placed prone on the fluoroscopic table. The skin overlying the thoracolumbar region was then prepped and draped in the usual sterile fashion. The right pedicle at L1 was then infiltrated with 0.25% bupivacaine  followed by the advancement of an 11-gauge Jamshidi needle through the right pedicle into the posterior one-third at L1. This was then exchanged for a Kyphon advanced osteo introducer system comprised of a working cannula and a Kyphon osteo drill. This combination was then advanced over a Kyphon osteo bone pin until the tip of the Kyphon osteo drill was in the posterior third at L1. At this time, the bone pin was removed. In a medial trajectory, the combination was advanced until the tip of the working cannula was inside the posterior one-third at L1. The osteo drill was removed and a core sample sent for pathologic analysis. Through the working cannula, a Kyphon bone biopsy device was advanced to within 5 mm of the anterior aspect of L1. A core sample from this was also sent for pathologic analysis. Through the working cannula, a Kyphon inflatable bone tamp 20 x 3 was advanced and positioned with the distal marker 5 mm from the anterior aspect of L1. Crossing of the midline was seen on the AP projection. At this time, the balloon was expanded using contrast via a Kyphon inflation syringe device via microtubing. Inflations were continued until there was apposition with the inferior endplates. At this time, methylmethacrylate mixture was reconstituted with Tobramycin  in the Kyphon bone mixing device system. This was then loaded onto the Kyphon bone fillers. The balloon was deflated and removed followed by the instillation of 4-1/2 bone filler equivalents of methylmethacrylate mixture at L1 with excellent filling in the AP and lateral projections. No extravasation was  noted in the disk spaces or posteriorly into the spinal canal. No epidural venous contamination was seen. The working cannula and the bone filler were then retrieved and removed. Hemostasis was achieved at the skin entry site. IMPRESSION: 1. Status post fluoroscopic-guided needle placement for deep core bone biopsy at L1. 2. Status post  vertebral body augmentation using balloon kyphoplasty at L1 as described without event. 3. Patient and spouse advised to call referring MD's office in 48 hours for results of the biopsy. If the patient has known osteoporosis, recommend treatment as clinically indicated. If the patient's bone density status is unknown, DEXA scan is recommended. Electronically Signed   By: Luellen Sages M.D.   On: 07/04/2023 09:01   CT Chest W Contrast Result Date: 07/03/2023 CLINICAL DATA:  Staging non-small-cell lung cancer. Adenocarcinoma left upper lobe. Previous abnormal lymph nodes. * Tracking Code: BO *. EXAM: CT CHEST WITH CONTRAST TECHNIQUE: Multidetector CT imaging of the chest was performed during intravenous contrast administration. RADIATION DOSE REDUCTION: This exam was performed according to the departmental dose-optimization program which includes automated exposure control, adjustment of the mA and/or kV according to patient size and/or use of iterative reconstruction technique. CONTRAST:  75mL OMNIPAQUE  IOHEXOL  300 MG/ML  SOLN COMPARISON:  PET-CT scan 03/01/2023. Older chest CT scan including November 2024 and older. FINDINGS: Cardiovascular: Heart is nonenlarged. Trace pericardial fluid. Coronary artery calcifications are seen. Thoracic aorta has a normal course and caliber with slight partially calcified atherosclerotic plaque. Pulsation artifact along the ascending aorta. Mediastinum/Nodes: Moderate hiatal hernia. Slightly patulous esophagus. Small thyroid  gland. There is a loculated fluid collection or cystic lesion to the right of the hiatal hernia previously scheme. 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 No abnormal uptake on previous exam. No specific abnormal lymph node enlargement identified in the axillary regions, hilum or mediastinum. Lungs/Pleura: Areas of parenchymal opacity identified perihilar and paramediastinal with areas of distortion and bronchiectasis. Areas are bandlike. These could be posttreatment related and distribution is similar to previous slightly more left than right and more left upper lobe in particular. Dependent basilar atelectasis or scarring are again seen as well, right greater than left. No new consolidation, pneumothorax or effusion. Other areas of scarring atelectatic changes. No new dominant lung mass. On remote CT scans including September 2024 there are some tiny areas of lung nodularity left upper lobe which have improved. Stable punctate nodule measuring 2 mm left upper lobe series 6, image 57. Upper Abdomen: Stable 12 mm right adrenal nodule going back to a study of 2021 by report. Bosniak 1 right-sided renal cyst. Preserved left adrenal gland. There is some bubbles of air in the left upper abdomen beneath the diaphragm. Please correlate with surgical history. Musculoskeletal: Scattered degenerative changes. Compression deformity identified at what appears to be L1 which is new from the CT scan of September 2024 but was seen on the prior PET-CT. Stable sclerotic focus involving the T11 vertebral body. IMPRESSION: Stable bilateral perihilar and paramediastinal lung opacities. Moderate hiatal hernia. Compression deformity of L1. There are some free air identified in the left upper quadrant of the abdomen beneath the diaphragm. Clinic note describes history of a recent cholecystectomy of 06/28/2023. Aortic Atherosclerosis (ICD10-I70.0). Electronically Signed   By: Adrianna Horde M.D.   On: 07/03/2023 17:37     ASSESSMENT AND PLAN: This is a very pleasant 73 years old white female recently diagnosed with a stage IIIc/IV (T3, N3,  M0/M1a) non-small cell lung cancer, adenocarcinoma presented with large left upper lobe lung mass in addition to left infrahilar, right hilar and subcarinal lymphadenopathy and small left pleural effusion diagnosed in August 2021 with positive EGFR mutation in exon 21 (L858R). The patient completed a course of concurrent chemoradiation with weekly carboplatin  and paclitaxel  status post 7 cycles.  She tolerated her treatment well except  for fatigue as well as a skin burn and cough. Her scan showed mild improvement of the left upper lobe lung mass as well as the mediastinal lymphadenopathy.  The patient continues to have small left pleural effusion that is suspicious given her presentation. She is currently on treatment with Tagrisso  80 mg p.o. daily started on January 31, 2020.  She is status post 42 months of treatment.   She had repeat CT scan of the chest performed recently.  I personally independently reviewed the scan and discussed the result with the patient and her friend.  Her scan showed no concerning findings for disease recurrence or metastasis.  She also underwent kyphoplasty of L2 vertebral lesion and the biopsy was negative for malignancy.     Stage III C non-small cell lung cancer Adenocarcinoma with positive EGFR mutation (exon 21, L858R), well-managed on Tagrisso  for over three years. Recent chest CT shows no progression or metastasis. She has been on Tagrisso  longer than the studied three-year duration. Concerns about discontinuation were addressed, reassuring her that stability is common post-discontinuation. - Discontinue Tagrisso  after finishing current supply - Schedule follow-up appointment in three months with repeat chest CT  Weight loss Significant weight loss of approximately 30 pounds, with a recent loss of 5 pounds since last month, associated with anorexia. Requires monitoring.  Degenerative disc disease Suspected cause of bone lesions on imaging. Recent biopsy of L2  vertebra showed no evidence of malignancy, supporting degenerative changes rather than metastatic disease.  Post-kyphoplasty pain Persistent back pain following kyphoplasty at L2, possibly due to surgical manipulation. Reports improvement in severe pain post-procedure. Biopsy was negative for malignancy.  Cholecystectomy No current issues reported related to cholecystectomy.   The patient was advised to call immediately if she has any concerning symptoms in the interval.  The patient voices understanding of current disease status and treatment options and is in agreement with the current care plan.  All questions were answered. The patient knows to call the clinic with any problems, questions or concerns. We can certainly see the patient much sooner if necessary. The total time spent in the appointment was 30 minutes including review of chart and various tests results, discussions about plan of care and coordination of care plan .  Disclaimer: This note was dictated with voice recognition software. Similar sounding words can inadvertently be transcribed and may not be corrected upon review.

## 2023-07-11 ENCOUNTER — Ambulatory Visit (INDEPENDENT_AMBULATORY_CARE_PROVIDER_SITE_OTHER): Admitting: Surgery

## 2023-07-11 DIAGNOSIS — Z09 Encounter for follow-up examination after completed treatment for conditions other than malignant neoplasm: Secondary | ICD-10-CM

## 2023-07-11 NOTE — Progress Notes (Signed)
 [facility]  I am calling the patient for post operative evaluation. This is not a billable encounter as it is under the global charges for the surgery.  The patient had a robotic assisted laparoscopic cholecystectomy on 5/9. The patient reports that she is doing well.  She is tolerating a diet, having good pain control, and having regular Bms.  She has noticed some yellow colors to her stool.  The incisions are healing well and the glue is starting to come off.  Advised that she can apply antibiotic ointment to the incisions to help get the remaining glue off.  The patient has no concerns.    Pathology: A. GALLBLADDER, CHOLECYSTECTOMY:  - Chronic cholecystitis.  - Negative for malignancy.   Will see the patient PRN.   Lidia Reels, DO [facility] [address] [place], [place] [address] [phone] (office)

## 2023-07-18 DIAGNOSIS — M5136 Other intervertebral disc degeneration, lumbar region with discogenic back pain only: Secondary | ICD-10-CM | POA: Diagnosis not present

## 2023-07-22 DIAGNOSIS — M545 Low back pain, unspecified: Secondary | ICD-10-CM | POA: Diagnosis not present

## 2023-07-24 DIAGNOSIS — M545 Low back pain, unspecified: Secondary | ICD-10-CM | POA: Diagnosis not present

## 2023-07-25 ENCOUNTER — Other Ambulatory Visit (HOSPITAL_COMMUNITY): Payer: Self-pay

## 2023-07-25 ENCOUNTER — Other Ambulatory Visit: Payer: Self-pay

## 2023-07-25 ENCOUNTER — Telehealth: Payer: Self-pay

## 2023-07-25 DIAGNOSIS — M545 Low back pain, unspecified: Secondary | ICD-10-CM | POA: Diagnosis not present

## 2023-07-25 NOTE — Telephone Encounter (Signed)
 Received voicemail from patient stating that the pharmacy has sent her prescription for Tagrisso . Patient mentioned that during her last visit with Dr. Marguerita Shih, they discussed taking a break from Tagrisso . Dr. Bing Buff note on 07/10/2023 confirms that she will discontinue Tagrisso  after completing the current supply.  Spoke with [facility] and informed them that the patient is taking a break from Tagrisso ; a new prescription will be issued if and when the patient resumes the medication.  Attempted to contact patient to relay this information. Left voicemail requesting a return call if there are any questions.

## 2023-07-25 NOTE — Progress Notes (Signed)
 Disenrolling - per 5.21.25 chart notes patient is to discontinue Tagrisso  after finishing current supply. Will re-enroll if new rx received or patient starts new therapy.

## 2023-07-26 ENCOUNTER — Encounter: Payer: Self-pay | Admitting: Internal Medicine

## 2023-07-29 ENCOUNTER — Other Ambulatory Visit: Payer: Self-pay

## 2023-07-29 DIAGNOSIS — M545 Low back pain, unspecified: Secondary | ICD-10-CM | POA: Diagnosis not present

## 2023-07-31 DIAGNOSIS — M545 Low back pain, unspecified: Secondary | ICD-10-CM | POA: Diagnosis not present

## 2023-08-06 DIAGNOSIS — M545 Low back pain, unspecified: Secondary | ICD-10-CM | POA: Diagnosis not present

## 2023-08-08 DIAGNOSIS — M545 Low back pain, unspecified: Secondary | ICD-10-CM | POA: Diagnosis not present

## 2023-08-14 DIAGNOSIS — M545 Low back pain, unspecified: Secondary | ICD-10-CM | POA: Diagnosis not present

## 2023-08-16 DIAGNOSIS — F411 Generalized anxiety disorder: Secondary | ICD-10-CM | POA: Diagnosis not present

## 2023-08-16 DIAGNOSIS — Z6823 Body mass index (BMI) 23.0-23.9, adult: Secondary | ICD-10-CM | POA: Diagnosis not present

## 2023-08-16 DIAGNOSIS — M545 Low back pain, unspecified: Secondary | ICD-10-CM | POA: Diagnosis not present

## 2023-08-17 ENCOUNTER — Encounter (HOSPITAL_COMMUNITY): Payer: Self-pay | Admitting: Interventional Radiology

## 2023-08-26 DIAGNOSIS — T402X5A Adverse effect of other opioids, initial encounter: Secondary | ICD-10-CM | POA: Diagnosis not present

## 2023-08-26 DIAGNOSIS — S32010D Wedge compression fracture of first lumbar vertebra, subsequent encounter for fracture with routine healing: Secondary | ICD-10-CM | POA: Diagnosis not present

## 2023-08-26 DIAGNOSIS — K5903 Drug induced constipation: Secondary | ICD-10-CM | POA: Diagnosis not present

## 2023-08-26 DIAGNOSIS — M545 Low back pain, unspecified: Secondary | ICD-10-CM | POA: Diagnosis not present

## 2023-08-28 DIAGNOSIS — M545 Low back pain, unspecified: Secondary | ICD-10-CM | POA: Diagnosis not present

## 2023-09-16 ENCOUNTER — Telehealth (HOSPITAL_BASED_OUTPATIENT_CLINIC_OR_DEPARTMENT_OTHER): Payer: Self-pay | Admitting: Obstetrics & Gynecology

## 2023-09-16 NOTE — Telephone Encounter (Signed)
 New message    The patient is requesting referrals for a primary care physician  gastroenterologist and endocrinologist - due for colonoscopy.  The previous PCP in [place] has moved his practice to pediatric care only, and the GI/Endo doctor has retired.

## 2023-09-17 ENCOUNTER — Other Ambulatory Visit: Payer: Self-pay

## 2023-09-18 DIAGNOSIS — H5203 Hypermetropia, bilateral: Secondary | ICD-10-CM | POA: Diagnosis not present

## 2023-09-24 ENCOUNTER — Other Ambulatory Visit: Payer: Self-pay | Admitting: Medical Oncology

## 2023-09-24 ENCOUNTER — Telehealth: Payer: Self-pay | Admitting: Medical Oncology

## 2023-09-24 ENCOUNTER — Encounter: Payer: Self-pay | Admitting: Medical Oncology

## 2023-09-24 DIAGNOSIS — R519 Headache, unspecified: Secondary | ICD-10-CM

## 2023-09-24 DIAGNOSIS — R296 Repeated falls: Secondary | ICD-10-CM | POA: Diagnosis not present

## 2023-09-24 DIAGNOSIS — R29898 Other symptoms and signs involving the musculoskeletal system: Secondary | ICD-10-CM | POA: Diagnosis not present

## 2023-09-24 DIAGNOSIS — C3490 Malignant neoplasm of unspecified part of unspecified bronchus or lung: Secondary | ICD-10-CM

## 2023-09-24 DIAGNOSIS — R269 Unspecified abnormalities of gait and mobility: Secondary | ICD-10-CM | POA: Diagnosis not present

## 2023-09-24 NOTE — Telephone Encounter (Signed)
 Pt presented to lobby and gave me a disc with her MRI imaging of her lumbar spine. Impression: ...worrisome for metastatic disease versus multiple myeloma.  New headaches-She described having headaches for a while  pointing to the  top of head. She also endorses balance stating that her legs are weak and she almost fell 3 times. She states she has also lost more weight and attributes it to her recent trip to [place].   She denies vision problems or any other symptoms.  Next appt 08/21 to f/u scans.

## 2023-09-25 ENCOUNTER — Telehealth: Payer: Self-pay | Admitting: Medical Oncology

## 2023-09-25 ENCOUNTER — Other Ambulatory Visit: Payer: Self-pay | Admitting: Medical Oncology

## 2023-09-25 DIAGNOSIS — C3490 Malignant neoplasm of unspecified part of unspecified bronchus or lung: Secondary | ICD-10-CM

## 2023-09-25 DIAGNOSIS — R519 Headache, unspecified: Secondary | ICD-10-CM

## 2023-09-25 NOTE — Progress Notes (Signed)
 MRI brain order faxed to [facility] per pt request.

## 2023-09-25 NOTE — Telephone Encounter (Signed)
 Linda Woods tried to schedule MRI @ @ [facility] with pt. Linda Woods prefers  to get her MRI at [facility] because it is more open and she is claustrophobic.

## 2023-09-25 NOTE — Telephone Encounter (Signed)
 Per [place] in scheduling I faxed order for Brain MRI to [facility].Pt requested this facility due to claustrophobia.

## 2023-09-26 ENCOUNTER — Telehealth: Payer: Self-pay

## 2023-09-26 NOTE — Telephone Encounter (Signed)
 Spoke with Tiffany at [facility] in regards to patients PA for MRI.  Faxed referral with approved authorization to [phone] with confirmation.

## 2023-09-29 ENCOUNTER — Encounter: Payer: Self-pay | Admitting: Internal Medicine

## 2023-09-30 ENCOUNTER — Encounter: Payer: Self-pay | Admitting: Medical Oncology

## 2023-09-30 ENCOUNTER — Ambulatory Visit (HOSPITAL_COMMUNITY)
Admission: RE | Admit: 2023-09-30 | Discharge: 2023-09-30 | Disposition: A | Discharge status: Home / Self Care | Source: Ambulatory Visit | Attending: Internal Medicine | Admitting: Internal Medicine

## 2023-09-30 ENCOUNTER — Inpatient Hospital Stay: Attending: Internal Medicine

## 2023-09-30 ENCOUNTER — Telehealth: Payer: Self-pay | Admitting: Medical Oncology

## 2023-09-30 DIAGNOSIS — C3490 Malignant neoplasm of unspecified part of unspecified bronchus or lung: Secondary | ICD-10-CM

## 2023-09-30 DIAGNOSIS — C7951 Secondary malignant neoplasm of bone: Secondary | ICD-10-CM | POA: Diagnosis not present

## 2023-09-30 DIAGNOSIS — R634 Abnormal weight loss: Secondary | ICD-10-CM | POA: Insufficient documentation

## 2023-09-30 DIAGNOSIS — C3412 Malignant neoplasm of upper lobe, left bronchus or lung: Secondary | ICD-10-CM | POA: Diagnosis not present

## 2023-09-30 DIAGNOSIS — R531 Weakness: Secondary | ICD-10-CM | POA: Insufficient documentation

## 2023-09-30 DIAGNOSIS — R590 Localized enlarged lymph nodes: Secondary | ICD-10-CM | POA: Diagnosis not present

## 2023-09-30 DIAGNOSIS — R519 Headache, unspecified: Secondary | ICD-10-CM | POA: Insufficient documentation

## 2023-09-30 DIAGNOSIS — I700 Atherosclerosis of aorta: Secondary | ICD-10-CM | POA: Diagnosis not present

## 2023-09-30 LAB — CBC WITH DIFFERENTIAL (CANCER CENTER ONLY)
Abs Immature Granulocytes: 0.03 K/uL (ref 0.00–0.07)
Basophils Absolute: 0 K/uL (ref 0.0–0.1)
Basophils Relative: 0 %
Eosinophils Absolute: 0.1 K/uL (ref 0.0–0.5)
Eosinophils Relative: 1 %
HCT: 36.9 % (ref 36.0–46.0)
Hemoglobin: 12.6 g/dL (ref 12.0–15.0)
Immature Granulocytes: 0 %
Lymphocytes Relative: 10 %
Lymphs Abs: 0.8 K/uL (ref 0.7–4.0)
MCH: 31 pg (ref 26.0–34.0)
MCHC: 34.1 g/dL (ref 30.0–36.0)
MCV: 90.7 fL (ref 80.0–100.0)
Monocytes Absolute: 0.7 K/uL (ref 0.1–1.0)
Monocytes Relative: 10 %
Neutro Abs: 5.8 K/uL (ref 1.7–7.7)
Neutrophils Relative %: 79 %
Platelet Count: 264 K/uL (ref 150–400)
RBC: 4.07 MIL/uL (ref 3.87–5.11)
RDW: 13.4 % (ref 11.5–15.5)
WBC Count: 7.4 K/uL (ref 4.0–10.5)
nRBC: 0 % (ref 0.0–0.2)

## 2023-09-30 LAB — CMP (CANCER CENTER ONLY)
ALT: 10 U/L (ref 0–44)
AST: 14 U/L — ABNORMAL LOW (ref 15–41)
Albumin: 4.5 g/dL (ref 3.5–5.0)
Alkaline Phosphatase: 123 U/L (ref 38–126)
Anion gap: 6 (ref 5–15)
BUN: 16 mg/dL (ref 8–23)
CO2: 30 mmol/L (ref 22–32)
Calcium: 9.9 mg/dL (ref 8.9–10.3)
Chloride: 100 mmol/L (ref 98–111)
Creatinine: 1.22 mg/dL — ABNORMAL HIGH (ref 0.44–1.00)
GFR, Estimated: 47 mL/min — ABNORMAL LOW (ref >60)
Glucose, Bld: 128 mg/dL — ABNORMAL HIGH (ref 70–99)
Potassium: 4 mmol/L (ref 3.5–5.1)
Sodium: 136 mmol/L (ref 135–145)
Total Bilirubin: 0.4 mg/dL (ref 0.0–1.2)
Total Protein: 6.8 g/dL (ref 6.5–8.1)

## 2023-09-30 MED ORDER — IOHEXOL 300 MG/ML  SOLN
100.0000 mL | Freq: Once | INTRAMUSCULAR | Status: AC | PRN
  Administered 2023-09-30 (×2): 100 mL via INTRAVENOUS

## 2023-09-30 MED ORDER — SODIUM CHLORIDE (PF) 0.9 % IJ SOLN
INTRAMUSCULAR | Status: AC
Start: 2023-09-30 — End: 2023-09-30
  Filled 2023-09-30: qty 50

## 2023-09-30 NOTE — Telephone Encounter (Signed)
 Linda Woods left a voice mail today that her headaches are worse since last week.  I returned her call and she said  Today it is not so bad.  She has her MRI brain tomorrow and will get disc and report for her appt with Dr. Sherrod on 10/10/23.

## 2023-10-01 DIAGNOSIS — R519 Headache, unspecified: Secondary | ICD-10-CM | POA: Diagnosis not present

## 2023-10-09 ENCOUNTER — Telehealth: Payer: Self-pay

## 2023-10-09 NOTE — Telephone Encounter (Signed)
 Patient called and left a voicemail inquiring about results for her brain MRI and CT scan, and whether her daughter could attend her appointment with Dr. Sherrod tomorrow along with her husband.  Returned call and spoke with patient. Informed her that it is fine for both her daughter and husband to accompany her to the appointment tomorrow. Also informed her that I contacted [facility] and their office faxed the brain MRI results to our office. Additionally, a call was placed to GI to request interpretation of the CT scan prior to her appointment tomorrow. Patient voiced thanks and understanding.

## 2023-10-10 ENCOUNTER — Other Ambulatory Visit: Payer: Self-pay

## 2023-10-10 ENCOUNTER — Telehealth: Payer: Self-pay

## 2023-10-10 ENCOUNTER — Inpatient Hospital Stay

## 2023-10-10 ENCOUNTER — Inpatient Hospital Stay (HOSPITAL_BASED_OUTPATIENT_CLINIC_OR_DEPARTMENT_OTHER): Admitting: Internal Medicine

## 2023-10-10 VITALS — BP 109/72 | HR 91 | Temp 98.0°F | Resp 16 | Ht 64.0 in | Wt 128.0 lb

## 2023-10-10 DIAGNOSIS — C7951 Secondary malignant neoplasm of bone: Secondary | ICD-10-CM | POA: Diagnosis not present

## 2023-10-10 DIAGNOSIS — C3492 Malignant neoplasm of unspecified part of left bronchus or lung: Secondary | ICD-10-CM

## 2023-10-10 DIAGNOSIS — R634 Abnormal weight loss: Secondary | ICD-10-CM | POA: Diagnosis not present

## 2023-10-10 DIAGNOSIS — R519 Headache, unspecified: Secondary | ICD-10-CM | POA: Diagnosis not present

## 2023-10-10 DIAGNOSIS — R531 Weakness: Secondary | ICD-10-CM | POA: Diagnosis not present

## 2023-10-10 DIAGNOSIS — C3412 Malignant neoplasm of upper lobe, left bronchus or lung: Secondary | ICD-10-CM | POA: Diagnosis not present

## 2023-10-10 DIAGNOSIS — R590 Localized enlarged lymph nodes: Secondary | ICD-10-CM | POA: Diagnosis not present

## 2023-10-10 DIAGNOSIS — C3490 Malignant neoplasm of unspecified part of unspecified bronchus or lung: Secondary | ICD-10-CM | POA: Diagnosis not present

## 2023-10-10 LAB — MISCELLANEOUS TEST

## 2023-10-10 NOTE — Telephone Encounter (Signed)
 Patient called and LVM in regards to disc from brain MRI performed at [facility].    Tried to contact patient in regards to VM.  Left VM informing her that it would be great to have the images from the brain MRI for Dr. Sherrod to review. Asked for a return call with any questions.

## 2023-10-10 NOTE — Progress Notes (Signed)
 [facility] Telephone:([phone]) [phone]   Fax:([phone]) [phone]  OFFICE PROGRESS NOTE  Linda Rush, MD [address] [place] [address]  DIAGNOSIS: Stage IIIc (T3, N3, M0) non-small cell lung cancer, adenocarcinoma presented with large left upper lobe lung mass in addition to left infrahilar and right hilar and subcarinal lymphadenopathy diagnosed in August 2021.   Molecular Biomarkers: Insufficient tissue for foundation 1. Guardant 360 results.  DETECTED ALTERATION(S) / BIOMARKER(S) % CFDNA OR AMPLIFICATION ASSOCIATED FDA-APPROVED THERAPIES CLINICAL TRIAL AVAILABILITY  EGFR L858R, 1.2%, Afatinib, Dacomitinib, Erlotinib, Gefitinib, Osimertinib , Ramucirumab Yes [id number], 1.0%, Afatinib, Dacomitinib, Erlotinib, Gefitinib, Osimertinib , Ramucirumab Yes [id number] 1.0% None  No   PRIOR THERAPY: Weekly concurrent chemoradiation with carboplatin  for an AUC of 2 and paclitaxel  45 mg/m2.  First dose expected on 11/16/2019. Status post 7 cycles.   CURRENT THERAPY: Tagrisso  80 mg p.o. daily. First dose started 01/31/2020.  Status post 42 months of treatment.  She has been off treatment for the last 3 months.  INTERVAL HISTORY: Linda Woods 73 y.o. female returns to clinic today for follow-up visit accompanied by her husband. Discussed the use of AI scribe software for clinical note transcription with the patient, who gave verbal consent to proceed.  History of Present Illness Linda Woods is a 73 year old female with stage III C non-small cell lung cancer who presents for evaluation after repeating MRI of the brain and CT scan of the chest, abdomen, and pelvis for restaging of her disease. She is accompanied by her husband.  She was diagnosed with stage III C non-small cell lung cancer, adenocarcinoma, in August 2021 with a positive EGFR mutation in exon 21, L858R. She underwent concurrent chemoradiation with weekly carboplatin  and paclitaxel , followed by  consolidation treatment with Tagrisso  80 mg PO daily. She has been on treatment for 42 months.  She experiences severe headaches, which are new. An MRI of the brain showed no evidence of intracranial mass, cerebral infarction, or other acute abnormalities. A CT scan of the brain and back also did not reveal any concerning findings. She recalls a previous MRI of the back showing something at L1, for which she had kyphoplasty and a biopsy that was negative for cancer.  She underwent gallbladder surgery, after which she has felt weak and has not been eating well. She experiences dizziness and her knees have been giving way, causing her to fall. She also reports experiencing sore neck and headaches.  Her husband notes that she had her right knee replaced in February, followed by gallbladder surgery, and has had persistent headaches since then. She experiences dizziness when getting up to walk and has had episodes of her knees buckling.      MEDICAL HISTORY: Past Medical History:  Diagnosis Date   Anemia    as a child   Anxiety    Asthma 10/19/2020   Bursitis    Chronic reflux esophagitis    Complication of anesthesia    Diarrhea, functional    Diverticulosis    GERD (gastroesophageal reflux disease)    History of kidney stones    Hypertension    Knee pain    right knee-seeing ortho   lung ca 09/2019   Osteoarthritis    Plantar fasciitis    Pneumonia    PONV (postoperative nausea and vomiting)    has used the patch before and that helps   Pure hypercholesterolemia    RLS (restless legs syndrome)    Superficial vein thrombosis  Thyroid  disease    hypothyroid    ALLERGIES:  has no known allergies.  MEDICATIONS:  Current Outpatient Medications  Medication Sig Dispense Refill   albuterol  (VENTOLIN  HFA) 108 (90 Base) MCG/ACT inhaler Inhale 2 puffs into the lungs every 6 (six) hours as needed for wheezing or shortness of breath. 8.5 g 0   alum & mag hydroxide-simeth  (MAALOX/MYLANTA) 200-200-20 MG/5ML suspension Take 15 mLs by mouth every 6 (six) hours as needed for indigestion or heartburn. 355 mL 0   benzonatate  (TESSALON ) 100 MG capsule Take 1 capsule (100 mg total) by mouth 2 (two) times daily. 20 capsule 0   chlorhexidine  (HIBICLENS ) 4 % external liquid Apply 15 mLs (1 Application total) topically as directed for 30 doses. Use as directed daily for 5 days every other week for 6 weeks. 946 mL 1   Cholecalciferol (VITAMIN D ) 50 MCG (2000 UT) tablet Take 2,000 Units by mouth daily.     ciprofloxacin  (CIPRO ) 100 MG tablet Take 100 mg by mouth 2 (two) times daily.     docusate sodium  (COLACE) 100 MG capsule Take 1 capsule (100 mg total) by mouth 2 (two) times daily. 60 capsule 2   famotidine  (PEPCID ) 40 MG tablet Take 40 mg by mouth at bedtime.     ferrous sulfate  325 (65 FE) MG tablet Take 325 mg by mouth daily with breakfast.     fexofenadine (ALLEGRA) 180 MG tablet Take 180 mg by mouth daily as needed for allergies.     fluticasone  (FLONASE ) 50 MCG/ACT nasal spray Place 1 spray into both nostrils as needed for allergies.     guaiFENesin -dextromethorphan  (ROBITUSSIN DM) 100-10 MG/5ML syrup Take 5 mLs by mouth every 4 (four) hours as needed for cough (chest congestion). 118 mL 0   levothyroxine  (SYNTHROID ) 100 MCG tablet Take 1 tablet (100 mcg total) by mouth daily. One po qd 90 tablet 0   lidocaine  (LIDODERM ) 5 % Place 1 patch onto the skin daily.     LORazepam  (ATIVAN ) 0.5 MG tablet Take 0.5 mg by mouth at bedtime.      MAGNESIUM  PO Take 1 tablet by mouth daily.     melatonin 5 MG TABS Take 5 mg by mouth at bedtime as needed (sleep).     metoprolol  succinate (TOPROL -XL) 50 MG 24 hr tablet Take 1 tablet (50 mg total) by mouth daily. 90 tablet 3   MYRBETRIQ  50 MG TB24 tablet Take 50 mg by mouth daily.     ondansetron  (ZOFRAN ) 4 MG tablet Take 1 tablet (4 mg total) by mouth every 6 (six) hours as needed for nausea. 20 tablet 0   osimertinib  mesylate (TAGRISSO )  80 MG tablet TAKE 1 TABLET BY MOUTH DAILY.     osimertinib  mesylate (TAGRISSO ) 80 MG tablet TAKE 1 TABLET BY MOUTH DAILY. 30 tablet 2   oxyCODONE  (OXY IR/ROXICODONE ) 5 MG immediate release tablet Take 5 mg by mouth every 6 (six) hours as needed for severe pain (pain score 7-10).     POTASSIUM PO Take 1 tablet by mouth daily.     PREMARIN vaginal cream Place 1 applicator vaginally as needed (urinary issues).     RABEprazole  (ACIPHEX ) 20 MG tablet Take 1 tablet (20 mg total) by mouth 2 (two) times a week. (Patient taking differently: Take 20 mg by mouth every other day.)     rosuvastatin  (CRESTOR ) 5 MG tablet Take 1 tablet (5 mg total) by mouth daily. 90 tablet 3   Tiotropium Bromide -Olodaterol 2.5-2.5 MCG/ACT AERS Inhale  2 puffs into the lungs daily. 4 g 5   triamcinolone  cream (KENALOG ) 0.1 % Apply 1 Application topically 2 (two) times daily. As needed for itching/rash (Patient taking differently: Apply 1 Application topically as needed (rash/itching).) 453.6 g 0   vitamin B-12 (CYANOCOBALAMIN ) 1000 MCG tablet Take 1,000 mcg by mouth daily.     No current facility-administered medications for this visit.    SURGICAL HISTORY:  Past Surgical History:  Procedure Laterality Date   APPENDECTOMY     BRONCHIAL BIOPSY  10/20/2019   Procedure: BRONCHIAL BIOPSIES;  Surgeon: Shelah Lamar RAMAN, MD;  Location: [facility] ENDOSCOPY;  Service: Pulmonary;;   BRONCHIAL BRUSHINGS  10/20/2019   Procedure: BRONCHIAL BRUSHINGS;  Surgeon: Shelah Lamar RAMAN, MD;  Location: [facility] ENDOSCOPY;  Service: Pulmonary;;   BRONCHIAL NEEDLE ASPIRATION BIOPSY  10/20/2019   Procedure: BRONCHIAL NEEDLE ASPIRATION BIOPSIES;  Surgeon: Shelah Lamar RAMAN, MD;  Location: [facility] ENDOSCOPY;  Service: Pulmonary;;   BRONCHIAL WASHINGS  10/20/2019   Procedure: BRONCHIAL WASHINGS;  Surgeon: Shelah Lamar RAMAN, MD;  Location: [facility] ENDOSCOPY;  Service: Pulmonary;;   CARPAL TUNNEL RELEASE Right 2011   Dr. Camella   COLPORRHAPHY     posterior   HAND SURGERY Right 2016    Nerve surgery, Dr. Camella   IR KYPHO LUMBAR INC FX REDUCE BONE BX UNI/BIL CANNULATION INC/IMAGING  07/03/2023   OVARIAN CYST REMOVAL     RECTOCELE REPAIR  2011   w/TVH and sling   TONSILLECTOMY     TONSILLECTOMY     TOTAL KNEE ARTHROPLASTY Left 09/09/2018   Procedure: TOTAL KNEE ARTHROPLASTY, CORTISONE INJECTION RIGHT KNEE;  Surgeon: Ernie Cough, MD;  Location: [facility] ORS;  Service: Orthopedics;  Laterality: Left;  70 mins   TOTAL KNEE ARTHROPLASTY Right 04/16/2023   Procedure: TOTAL KNEE ARTHROPLASTY;  Surgeon: Ernie Cough, MD;  Location: [facility] ORS;  Service: Orthopedics;  Laterality: Right;   TOTAL VAGINAL HYSTERECTOMY  10/18/2009   rectocele repair, sling   TUBAL LIGATION Bilateral    VARICOSE VEIN SURGERY     VIDEO BRONCHOSCOPY WITH ENDOBRONCHIAL NAVIGATION N/A 10/20/2019   Procedure: VIDEO BRONCHOSCOPY WITH ENDOBRONCHIAL NAVIGATION;  Surgeon: Shelah Lamar RAMAN, MD;  Location: [facility] ENDOSCOPY;  Service: Pulmonary;  Laterality: N/A;    REVIEW OF SYSTEMS:  Constitutional: positive for anorexia, fatigue, and weight loss Eyes: negative Ears, nose, mouth, throat, and face: negative Respiratory: positive for dyspnea on exertion Cardiovascular: negative Gastrointestinal: positive for nausea Genitourinary:negative Integument/breast: negative Hematologic/lymphatic: negative Musculoskeletal:positive for arthralgias and back pain Neurological: positive for dizziness Behavioral/Psych: negative Endocrine: negative Allergic/Immunologic: negative   PHYSICAL EXAMINATION: General appearance: alert, cooperative, fatigued, and no distress Head: Normocephalic, without obvious abnormality, atraumatic Neck: no adenopathy, no JVD, supple, symmetrical, trachea midline, and thyroid  not enlarged, symmetric, no tenderness/mass/nodules Lymph nodes: Cervical, supraclavicular, and axillary nodes normal. Resp: clear to auscultation bilaterally Back: symmetric, no curvature. ROM normal. No CVA tenderness. Cardio:  regular rate and rhythm, S1, S2 normal, no murmur, click, rub or gallop GI: soft, non-tender; bowel sounds normal; no masses,  no organomegaly Extremities: extremities normal, atraumatic, no cyanosis or edema Neurologic: Alert and oriented X 3, normal strength and tone. Normal symmetric reflexes. Normal coordination and gait  ECOG PERFORMANCE STATUS: 1 - Symptomatic but completely ambulatory  Blood pressure 109/72, pulse 91, temperature 98 F (36.7 C), temperature source Temporal, resp. rate 16, height 5' 4 (1.626 m), weight 128 lb (58.1 kg), last menstrual period 08/21/2002, SpO2 100%.  LABORATORY DATA: Lab Results  Component Value Date   WBC 7.4  09/30/2023   HGB 12.6 09/30/2023   HCT 36.9 09/30/2023   MCV 90.7 09/30/2023   PLT 264 09/30/2023      Chemistry      Component Value Date/Time   NA 136 09/30/2023 1037   K 4.0 09/30/2023 1037   CL 100 09/30/2023 1037   CO2 30 09/30/2023 1037   BUN 16 09/30/2023 1037   CREATININE 1.22 (H) 09/30/2023 1037      Component Value Date/Time   CALCIUM  9.9 09/30/2023 1037   ALKPHOS 123 09/30/2023 1037   AST 14 (L) 09/30/2023 1037   ALT 10 09/30/2023 1037   BILITOT 0.4 09/30/2023 1037       RADIOGRAPHIC STUDIES: CT CHEST ABDOMEN PELVIS W CONTRAST Result Date: 10/09/2023 CLINICAL DATA:  Non-small cell lung cancer, staging. * Tracking Code: BO * EXAM: CT CHEST, ABDOMEN, AND PELVIS WITH CONTRAST TECHNIQUE: Multidetector CT imaging of the chest, abdomen and pelvis was performed following the standard protocol during bolus administration of intravenous contrast. RADIATION DOSE REDUCTION: This exam was performed according to the departmental dose-optimization program which includes automated exposure control, adjustment of the mA and/or kV according to patient size and/or use of iterative reconstruction technique. CONTRAST:  OMNIPAQUE  IOHEXOL  300 MG/ML  SOLN COMPARISON:  Multiple priors including CT Jul 01, 2023 FINDINGS: CT CHEST FINDINGS  Cardiovascular: Aortic atherosclerosis. Normal size heart. Fluid in the pericardial recesses similar prior. Mediastinum/Nodes: Loculated fluid collection or cystic lesion to the right of the hiatal hernia stable from prior examinations. Pathologically enlarged mediastinal, hilar or axillary lymph nodes. No suspicious thyroid  nodule.  Moderate-sized hiatal hernia. Lungs/Pleura: Similar left hilar masslike consolidation with the adjacent reticular opacities and associated volume loss measuring 3.7 x 2.5 cm on image 65/4 previously 4.2 x 3.2 cm when remeasured for consistency. Bandlike consolidation which extends from the hilum with bronchiectasis/bronchiolectasis also appears similar to prior. Consolidative nodularity posterior to the right hilum measures 17 x 14 mm on image 71/4 previously 2.6 x 2.0 cm when remeasured. There are linear bands of opacification extending from the right hilum with associated architectural distortion similar appearance to prior. Nodular focus along the posterior portion of the linear opacities in the right upper lobe measures 5 mm on image 60/4 previously 3 mm another adjacent nodular focus on image 60/4 measures 5 mm not seen on prior examination. Musculoskeletal: Sclerotic lesion in the T11 vertebral body appears similar prior. Stable sclerotic lesion in the right clavicle near the sternoclavicular joint. CT ABDOMEN PELVIS FINDINGS Hepatobiliary: No suspicious hepatic lesion. Gallbladder surgically absent. Prominence of the biliary tree may reflect reservoir effect post cholecystectomy. However, suggest correlation with serum total bilirubin for obstruction. Pancreas: No pancreatic ductal dilation or evidence of acute inflammation. Spleen: No splenomegaly or focal splenic lesion. Adrenals/Urinary Tract: Right adrenal nodule measures 12 x 11 mm on image 55/2 previously 11 x 9 mm. No suspicious left adrenal nodule. Stable hypodense bilateral renal lesions compatible with cysts. No  hydronephrosis. Kidneys demonstrate symmetric enhancement. Urinary bladder is unremarkable for degree of distension. Stomach/Bowel: Moderate-sized hiatal hernia. No pathologic dilation of small or large bowel. Colonic diverticulosis. Vascular/Lymphatic: Enlarged retroperitoneal lymph nodes. For reference: -A left periaortic lymph node at the level of the left renal hilum measuring 12 mm in short axis on image 68/2 previously measured 9 mm in short axis. -retrocaval lymph node measures 9 mm in short axis on image 67/2 previously 2 mm. Aortic atherosclerosis. Reproductive: Status post hysterectomy. No adnexal masses. Other: No significant abdominopelvic free fluid. Musculoskeletal: Sclerotic  lesions in the L5 and S2 vertebral bodies. Stable sclerotic lesion in the left iliac bone on image 99/5. Prior L1 vertebral body augmentation. Multilevel degenerative change of the spine. Avascular necrosis of the left femoral head. IMPRESSION: 1. Similar left hilar masslike consolidation with adjacent reticular opacities and associated volume loss, compatible with postradiation change. 2. Consolidative nodularity posterior to the right hilum is decreased in size from prior examination. 3. Nodular focus along the posterior portion of the linear opacities in the right upper lobe measures 5 mm previously 3 mm. Additional adjacent 5 mm nodular focus not seen on prior examination. Nonspecific suggest attention on follow-up imaging. 4. Increased size of the right adrenal nodule, suspicious for metastatic disease. 5. Increased size of the retroperitoneal lymph nodes, suspicious for metastatic disease. 6. Stable sclerotic osseous metastatic disease. 7. Prominence of the biliary tree may reflect reservoir effect post cholecystectomy. However, suggest correlation with serum total bilirubin for obstruction. 8. Avascular necrosis of the left femoral head. 9. Aortic atherosclerosis. Aortic Atherosclerosis (ICD10-I70.0). Electronically Signed    By: Linda Woods M.D.   On: 10/09/2023 15:32     ASSESSMENT AND PLAN: This is a very pleasant 72 years old white female recently diagnosed with a stage IIIc/IV (T3, N3, M0/M1a) non-small cell lung cancer, adenocarcinoma presented with large left upper lobe lung mass in addition to left infrahilar, right hilar and subcarinal lymphadenopathy and small left pleural effusion diagnosed in August 2021 with positive EGFR mutation in exon 21 (L858R). The patient completed a course of concurrent chemoradiation with weekly carboplatin  and paclitaxel  status post 7 cycles.  She tolerated her treatment well except for fatigue as well as a skin burn and cough. Her scan showed mild improvement of the left upper lobe lung mass as well as the mediastinal lymphadenopathy.  The patient continues to have small left pleural effusion that is suspicious given her presentation. She is currently on treatment with Tagrisso  80 mg p.o. daily started on January 31, 2020.  She is status post 42 months of treatment.  Her treatment is currently on hold for the last 3 months because of the adverse effect and fatigue. She also underwent kyphoplasty of L2 vertebral lesion and the biopsy was negative for malignancy. She had repeat CT scan of the chest, abdomen and pelvis performed recently.  She also had MRI of the brain.  I personally independently reviewed the imaging studies and discussed the result with the patient and her husband.  Her scan showed improvement of the disease in the lung but there is still suspicious right adrenal nodule as well as small retroperitoneal lymphadenopathy.  MRI of the brain was negative for metastatic disease. Assessment and Plan Assessment & Plan Stage IIIC EGFR-positive non-small cell lung adenocarcinoma, status post chemoradiation and targeted therapy, under surveillance for progression Stage IIIC non-small cell lung adenocarcinoma with EGFR mutation, previously treated with chemoradiation and  Tagrisso . Current imaging shows decreased size of left hilar mass and right lung lesion. Concerns about right adrenal gland and abdominal lymphadenopathy, but no definitive evidence of progression. Symptoms of headache and dizziness persist despite cessation of Tagrisso . PET scan and molecular study planned to evaluate for disease progression and resistance to Tagrisso . - Order PET scan to evaluate for disease progression - Perform molecular study to assess resistance to Tagrisso .  She will come back for follow-up visit in 1 months for evaluation. - Consider restarting Tagrisso  with mild chemotherapy if evidence of progression is found  Abdominal lymphadenopathy under evaluation for possible malignancy  or post-surgical inflammation Abdominal lymphadenopathy noted on imaging, possibly related to recent cholecystectomy or inflammation. Lymph nodes are small and not amenable to biopsy. PET scan planned to assess changes in lymphadenopathy. 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 Current imaging raises suspicion for metastatic disease, but prior PET scan suggested benign nature. PET scan planned to reassess adrenal nodule. - Order PET scan to reassess adrenal nodule  Headache and dizziness under evaluation with negative brain imaging Persistent headaches and dizziness with negative MRI of the brain. Possible musculoskeletal origin or related to recent surgeries. Referral to neurologist planned for further evaluation. - Refer to neurologist for further evaluation of headaches and dizziness  Weakness and weight loss following cholecystectomy Weakness and weight loss noted following cholecystectomy. Possible post-surgical inflammation contributing to symptoms. Referral to  gastroenterologist planned for evaluation of post-cholecystectomy symptoms. - Refer to gastroenterologist for evaluation of post-cholecystectomy symptoms The patient was advised to call immediately if she has any other concerning symptoms in the interval. The patient voices understanding of current disease status and treatment options and is in agreement with the current care plan.  All questions were answered. The patient knows to call the clinic with any problems, questions or concerns. We can certainly see the patient much sooner if necessary. The total time spent in the appointment was 55 minutes including review of chart and various tests results, discussions about plan of care and coordination of care plan .  Disclaimer: This note was dictated with voice recognition software. Similar sounding words can inadvertently be transcribed and may not be corrected upon review.

## 2023-10-11 NOTE — Progress Notes (Signed)
 [facility] CDx confirmation number [id number]

## 2023-10-13 ENCOUNTER — Other Ambulatory Visit: Payer: Self-pay | Admitting: Primary Care

## 2023-10-14 ENCOUNTER — Encounter: Payer: Self-pay | Admitting: Internal Medicine

## 2023-10-14 DIAGNOSIS — C3492 Malignant neoplasm of unspecified part of left bronchus or lung: Secondary | ICD-10-CM | POA: Diagnosis not present

## 2023-10-15 ENCOUNTER — Telehealth: Payer: Self-pay | Admitting: Internal Medicine

## 2023-10-15 ENCOUNTER — Encounter: Payer: Self-pay | Admitting: Neurology

## 2023-10-15 NOTE — Telephone Encounter (Signed)
 Scheduled appointments, spoke with patients husband regarding appointment information.

## 2023-10-16 DIAGNOSIS — M5136 Other intervertebral disc degeneration, lumbar region with discogenic back pain only: Secondary | ICD-10-CM | POA: Diagnosis not present

## 2023-10-17 ENCOUNTER — Inpatient Hospital Stay (HOSPITAL_COMMUNITY): Admission: RE | Admit: 2023-10-17 | Source: Ambulatory Visit

## 2023-10-17 ENCOUNTER — Ambulatory Visit (HOSPITAL_COMMUNITY)
Admission: RE | Admit: 2023-10-17 | Discharge: 2023-10-17 | Disposition: A | Discharge status: Home / Self Care | Source: Ambulatory Visit | Attending: Obstetrics & Gynecology | Admitting: Obstetrics & Gynecology

## 2023-10-17 ENCOUNTER — Other Ambulatory Visit

## 2023-10-17 DIAGNOSIS — E2839 Other primary ovarian failure: Secondary | ICD-10-CM | POA: Insufficient documentation

## 2023-10-17 DIAGNOSIS — Z780 Asymptomatic menopausal state: Secondary | ICD-10-CM | POA: Diagnosis not present

## 2023-10-17 DIAGNOSIS — M8580 Other specified disorders of bone density and structure, unspecified site: Secondary | ICD-10-CM | POA: Diagnosis not present

## 2023-10-18 ENCOUNTER — Encounter (HOSPITAL_COMMUNITY)
Admission: RE | Admit: 2023-10-18 | Discharge: 2023-10-18 | Disposition: A | Discharge status: Home / Self Care | Source: Ambulatory Visit | Attending: Internal Medicine | Admitting: Internal Medicine

## 2023-10-18 DIAGNOSIS — C7951 Secondary malignant neoplasm of bone: Secondary | ICD-10-CM | POA: Diagnosis not present

## 2023-10-18 DIAGNOSIS — C3490 Malignant neoplasm of unspecified part of unspecified bronchus or lung: Secondary | ICD-10-CM | POA: Insufficient documentation

## 2023-10-18 LAB — GLUCOSE, CAPILLARY: Glucose-Capillary: 104 mg/dL — ABNORMAL HIGH (ref 70–99)

## 2023-10-18 MED ORDER — FLUDEOXYGLUCOSE F - 18 (FDG) INJECTION
6.4000 | Freq: Once | INTRAVENOUS | Status: AC | PRN
  Administered 2023-10-18: 6.1 via INTRAVENOUS

## 2023-10-19 DIAGNOSIS — C3492 Malignant neoplasm of unspecified part of left bronchus or lung: Secondary | ICD-10-CM | POA: Diagnosis not present

## 2023-10-20 ENCOUNTER — Other Ambulatory Visit: Payer: Self-pay

## 2023-10-23 ENCOUNTER — Other Ambulatory Visit: Payer: Self-pay | Admitting: Internal Medicine

## 2023-10-23 DIAGNOSIS — Z1231 Encounter for screening mammogram for malignant neoplasm of breast: Secondary | ICD-10-CM

## 2023-10-31 ENCOUNTER — Ambulatory Visit (HOSPITAL_BASED_OUTPATIENT_CLINIC_OR_DEPARTMENT_OTHER): Payer: Self-pay | Admitting: Obstetrics & Gynecology

## 2023-11-12 ENCOUNTER — Other Ambulatory Visit: Payer: Self-pay

## 2023-11-12 ENCOUNTER — Inpatient Hospital Stay (HOSPITAL_BASED_OUTPATIENT_CLINIC_OR_DEPARTMENT_OTHER): Admitting: Internal Medicine

## 2023-11-12 ENCOUNTER — Inpatient Hospital Stay: Attending: Internal Medicine

## 2023-11-12 ENCOUNTER — Other Ambulatory Visit: Payer: Self-pay | Admitting: Pharmacist

## 2023-11-12 ENCOUNTER — Other Ambulatory Visit (HOSPITAL_COMMUNITY): Payer: Self-pay

## 2023-11-12 ENCOUNTER — Inpatient Hospital Stay

## 2023-11-12 VITALS — BP 126/72 | HR 80 | Temp 96.2°F | Resp 17 | Ht 64.0 in | Wt 118.5 lb

## 2023-11-12 DIAGNOSIS — R410 Disorientation, unspecified: Secondary | ICD-10-CM | POA: Diagnosis not present

## 2023-11-12 DIAGNOSIS — C3492 Malignant neoplasm of unspecified part of left bronchus or lung: Secondary | ICD-10-CM | POA: Diagnosis not present

## 2023-11-12 DIAGNOSIS — C3412 Malignant neoplasm of upper lobe, left bronchus or lung: Secondary | ICD-10-CM | POA: Insufficient documentation

## 2023-11-12 DIAGNOSIS — Z79899 Other long term (current) drug therapy: Secondary | ICD-10-CM | POA: Diagnosis not present

## 2023-11-12 DIAGNOSIS — C7951 Secondary malignant neoplasm of bone: Secondary | ICD-10-CM | POA: Diagnosis not present

## 2023-11-12 DIAGNOSIS — R634 Abnormal weight loss: Secondary | ICD-10-CM | POA: Insufficient documentation

## 2023-11-12 DIAGNOSIS — N136 Pyonephrosis: Secondary | ICD-10-CM | POA: Diagnosis not present

## 2023-11-12 DIAGNOSIS — C3490 Malignant neoplasm of unspecified part of unspecified bronchus or lung: Secondary | ICD-10-CM

## 2023-11-12 LAB — CBC WITH DIFFERENTIAL (CANCER CENTER ONLY)
Abs Immature Granulocytes: 0.02 K/uL (ref 0.00–0.07)
Basophils Absolute: 0 K/uL (ref 0.0–0.1)
Basophils Relative: 1 %
Eosinophils Absolute: 0.1 K/uL (ref 0.0–0.5)
Eosinophils Relative: 2 %
HCT: 36.7 % (ref 36.0–46.0)
Hemoglobin: 12.6 g/dL (ref 12.0–15.0)
Immature Granulocytes: 1 %
Lymphocytes Relative: 19 %
Lymphs Abs: 0.9 K/uL (ref 0.7–4.0)
MCH: 31.3 pg (ref 26.0–34.0)
MCHC: 34.3 g/dL (ref 30.0–36.0)
MCV: 91.1 fL (ref 80.0–100.0)
Monocytes Absolute: 0.5 K/uL (ref 0.1–1.0)
Monocytes Relative: 11 %
Neutro Abs: 2.9 K/uL (ref 1.7–7.7)
Neutrophils Relative %: 66 %
Platelet Count: 266 K/uL (ref 150–400)
RBC: 4.03 MIL/uL (ref 3.87–5.11)
RDW: 13.8 % (ref 11.5–15.5)
WBC Count: 4.4 K/uL (ref 4.0–10.5)
nRBC: 0 % (ref 0.0–0.2)

## 2023-11-12 LAB — CMP (CANCER CENTER ONLY)
ALT: 16 U/L (ref 0–44)
AST: 15 U/L (ref 15–41)
Albumin: 4.2 g/dL (ref 3.5–5.0)
Alkaline Phosphatase: 170 U/L — ABNORMAL HIGH (ref 38–126)
Anion gap: 7 (ref 5–15)
BUN: 14 mg/dL (ref 8–23)
CO2: 29 mmol/L (ref 22–32)
Calcium: 9.6 mg/dL (ref 8.9–10.3)
Chloride: 100 mmol/L (ref 98–111)
Creatinine: 0.8 mg/dL (ref 0.44–1.00)
GFR, Estimated: >60 mL/min (ref >60)
Glucose, Bld: 131 mg/dL — ABNORMAL HIGH (ref 70–99)
Potassium: 3.5 mmol/L (ref 3.5–5.1)
Sodium: 136 mmol/L (ref 135–145)
Total Bilirubin: 0.3 mg/dL (ref 0.0–1.2)
Total Protein: 6.5 g/dL (ref 6.5–8.1)

## 2023-11-12 MED ORDER — METHYLPREDNISOLONE 4 MG PO TBPK: ORAL_TABLET | ORAL | 0 refills | Status: DC

## 2023-11-12 MED ORDER — OSIMERTINIB MESYLATE 80 MG PO TABS
80.0000 mg | ORAL_TABLET | Freq: Every day | ORAL | 3 refills | Status: AC
  Filled 2023-12-04: qty 30, 30d supply, fill #0
  Filled 2023-12-26 – 2023-12-30 (×2): qty 30, 30d supply, fill #1
  Filled 2024-01-30 – 2024-01-31 (×2): qty 30, 30d supply, fill #2
  Filled 2024-03-05 – 2024-03-06 (×2): qty 30, 30d supply, fill #3

## 2023-11-12 MED ORDER — CYANOCOBALAMIN 1000 MCG/ML IJ SOLN
1000.0000 ug | Freq: Once | INTRAMUSCULAR | Status: AC
  Administered 2023-11-12: 1000 ug via INTRAMUSCULAR
  Filled 2023-11-12: qty 1

## 2023-11-12 MED ORDER — PROCHLORPERAZINE MALEATE 10 MG PO TABS: 10.0000 mg | ORAL_TABLET | Freq: Four times a day (QID) | ORAL | 1 refills | Status: AC | PRN

## 2023-11-12 MED ORDER — LIDOCAINE-PRILOCAINE 2.5-2.5 % EX CREA: TOPICAL_CREAM | CUTANEOUS | 3 refills | Status: DC

## 2023-11-12 MED ORDER — OSIMERTINIB MESYLATE 80 MG PO TABS: 80.0000 mg | ORAL_TABLET | Freq: Every day | ORAL | 3 refills | Status: DC

## 2023-11-12 MED ORDER — DEXAMETHASONE 4 MG PO TABS: ORAL_TABLET | ORAL | 1 refills | Status: DC

## 2023-11-12 MED ORDER — FOLIC ACID 1 MG PO TABS: 1.0000 mg | ORAL_TABLET | Freq: Every day | ORAL | 3 refills | Status: AC

## 2023-11-12 NOTE — Progress Notes (Signed)
 DISCONTINUE ON PATHWAY REGIMEN - Non-Small Cell Lung     A cycle is every 28 days:     Osimertinib    **Always confirm dose/schedule in your pharmacy ordering system**  PRIOR TREATMENT: [id number]: Osimertinib  80 mg PO Once Daily Until Progression or Unacceptable Toxicity  START ON PATHWAY REGIMEN - Non-Small Cell Lung     Cycles 1 through 4: A cycle is every 21 days:     Osimertinib       Pemetrexed      Carboplatin     Cycles 5 and beyond: A cycle is every 21 days:     Osimertinib       Pemetrexed   **Always confirm dose/schedule in your pharmacy ordering system**  Patient Characteristics: Stage IV Metastatic, Nonsquamous, Molecular Analysis Completed, Molecular Alteration Present and Eligible for Molecular Targeted Therapy, Initial Molecular Targeted Therapy, EGFR Mutation - Common (Exon 19 Deletion or Exon 21 L858R Substitution) Therapeutic Status: Stage IV Metastatic Histology: Nonsquamous Cell Broad Molecular Profiling Status: Molecular Analysis Completed Molecular Analysis Results: Alteration Present and Eligible for Molecular Targeted Therapy Molecular Alteration Present: EGFR Mutation - Common (Exon 19 Deletion or Exon 21 L858R Substitution) Molecular Targeted Line of Therapy: Initial Molecular Targeted Therapy Intent of Therapy: Non-Curative / Palliative Intent, Discussed with Patient

## 2023-11-12 NOTE — Progress Notes (Signed)
 Oral Oncology Pharmacist Encounter   Patient fills Tagrisso  through [facility] due to medication being a specialty medication - Tagrisso  prescription has been redirected for dispensing.    Oral Chemotherapy Clinic will follow up with outside pharmacy to ensure Rx is delivered.    Asberry Macintosh, PharmD, BCPS, BCOP Hematology/Oncology Clinical Pharmacist [phone] 11/12/2023 3:28 PM

## 2023-11-12 NOTE — Progress Notes (Signed)
 [facility] Telephone:([phone]   [phone]) [phone]  OFFICE PROGRESS NOTE  [name], MD [address] [place] [address]  DIAGNOSIS: Stage IIIc (T3, N3, M0) non-small cell lung cancer, adenocarcinoma presented with large left upper lobe lung mass in addition to left infrahilar and right hilar and subcarinal lymphadenopathy diagnosed in August 2021.   Molecular Biomarkers: Insufficient tissue for foundation 1. Guardant 360 results.  DETECTED ALTERATION(S) / BIOMARKER(S) % CFDNA OR AMPLIFICATION ASSOCIATED FDA-APPROVED THERAPIES CLINICAL TRIAL AVAILABILITY  EGFR L858R, 1.2%, Afatinib, Dacomitinib, Erlotinib, Gefitinib, Osimertinib , Ramucirumab Yes [id number], 1.0%, Afatinib, Dacomitinib, Erlotinib, Gefitinib, Osimertinib , Ramucirumab Yes [id number] 1.0% None  No   PRIOR THERAPY:  1) Weekly concurrent chemoradiation with carboplatin  for an AUC of 2 and paclitaxel  45 mg/m2.  First dose expected on 11/16/2019. Status post 7 cycles.  2) Tagrisso  80 mg p.o. daily. First dose started 01/31/2020.  Status post 42 months of treatment.  She has been off treatment for 4 months based on her request and secondary to adverse effects but this was discontinued secondary to disease progression.  CURRENT THERAPY: Systemic chemotherapy with carboplatin  for AUC of 5, Alimta 500 mg/M2 and Tagrisso  80 mg p.o. daily.  First dose 11/19/2023.  INTERVAL HISTORY: Linda Woods 73 y.o. female returns to clinic today for follow-up visit accompanied by her daughter Linda Woods. Discussed the use of AI scribe software for clinical note transcription with the patient, who gave verbal consent to proceed.  History of Present Illness Linda Woods is a 73 year old female with metastatic non-small cell lung cancer who presents for evaluation with a repeat PET scan. She is accompanied by her daughter, Linda Woods.  She has a history of metastatic non-small cell lung cancer with a  positive EGFR mutation L858R, initially diagnosed as stage III C in August 2021. She was treated with concurrent radiation followed by consolidation treatment with Tagrisso  (osimertinib ) 80 mg daily. She has been off treatment for the last four months due to side effects.  Recently, she experienced a sudden onset of confusion, headache, and fatigue while traveling [place] She was admitted to a hospital where blood work showed an elevated leukocyte count, but normal INR. An abdominal ultrasound and brain CT scan were unremarkable, and a lumbar puncture was performed with normal results. She was treated for a urinary tract infection with antibiotics including ceftriaxone, ampicillin, and mempropanolol, and was also given acyclovir, which was later discontinued.  She reports ongoing pain in her head and discomfort while sitting. She has experienced weight loss and decreased appetite. She has been experiencing confusion.  Her current medications include Tagrisso , which she has been off for four months, and antibiotics for her urinary tract infection. She has also been taking melatonin to aid with sleep.  No other symptoms reported.     MEDICAL HISTORY: Past Medical History:  Diagnosis Date   Anemia    as a child   Anxiety    Asthma 10/19/2020   Bursitis    Chronic reflux esophagitis    Complication of anesthesia    Diarrhea, functional    Diverticulosis    GERD (gastroesophageal reflux disease)    History of kidney stones    Hypertension    Knee pain    right knee-seeing ortho   lung ca 09/2019   Osteoarthritis    Plantar fasciitis    Pneumonia    PONV (postoperative nausea and vomiting)    has used the patch before and that  helps   Pure hypercholesterolemia    RLS (restless legs syndrome)    Superficial vein thrombosis    Thyroid  disease    hypothyroid    ALLERGIES:  has no known allergies.  MEDICATIONS:  Current Outpatient Medications  Medication Sig Dispense Refill    albuterol  (VENTOLIN  HFA) 108 (90 Base) MCG/ACT inhaler Inhale 2 puffs into the lungs every 6 (six) hours as needed for wheezing or shortness of breath. 8.5 g 0   alum & mag hydroxide-simeth (MAALOX/MYLANTA) 200-200-20 MG/5ML suspension Take 15 mLs by mouth every 6 (six) hours as needed for indigestion or heartburn. 355 mL 0   benzonatate  (TESSALON ) 100 MG capsule Take 1 capsule (100 mg total) by mouth 2 (two) times daily. 20 capsule 0   chlorhexidine  (HIBICLENS ) 4 % external liquid Apply 15 mLs (1 Application total) topically as directed for 30 doses. Use as directed daily for 5 days every other week for 6 weeks. 946 mL 1   Cholecalciferol (VITAMIN D ) 50 MCG (2000 UT) tablet Take 2,000 Units by mouth daily.     ciprofloxacin  (CIPRO ) 100 MG tablet Take 100 mg by mouth 2 (two) times daily.     docusate sodium  (COLACE) 100 MG capsule Take 1 capsule (100 mg total) by mouth 2 (two) times daily. 60 capsule 2   famotidine  (PEPCID ) 40 MG tablet Take 40 mg by mouth at bedtime.     ferrous sulfate  325 (65 FE) MG tablet Take 325 mg by mouth daily with breakfast.     fexofenadine (ALLEGRA) 180 MG tablet Take 180 mg by mouth daily as needed for allergies.     fluticasone  (FLONASE ) 50 MCG/ACT nasal spray Place 1 spray into both nostrils as needed for allergies.     guaiFENesin -dextromethorphan  (ROBITUSSIN DM) 100-10 MG/5ML syrup Take 5 mLs by mouth every 4 (four) hours as needed for cough (chest congestion). 118 mL 0   levothyroxine  (SYNTHROID ) 100 MCG tablet Take 1 tablet (100 mcg total) by mouth daily. One po qd 90 tablet 0   lidocaine  (LIDODERM ) 5 % Place 1 patch onto the skin daily.     LORazepam  (ATIVAN ) 0.5 MG tablet Take 0.5 mg by mouth at bedtime.      MAGNESIUM  PO Take 1 tablet by mouth daily.     melatonin 5 MG TABS Take 5 mg by mouth at bedtime as needed (sleep).     metoprolol  succinate (TOPROL -XL) 50 MG 24 hr tablet Take 1 tablet (50 mg total) by mouth daily. 90 tablet 3   MYRBETRIQ  50 MG TB24  tablet Take 50 mg by mouth daily.     ondansetron  (ZOFRAN ) 4 MG tablet Take 1 tablet (4 mg total) by mouth every 6 (six) hours as needed for nausea. 20 tablet 0   osimertinib  mesylate (TAGRISSO ) 80 MG tablet TAKE 1 TABLET BY MOUTH DAILY.     osimertinib  mesylate (TAGRISSO ) 80 MG tablet TAKE 1 TABLET BY MOUTH DAILY. 30 tablet 2   oxyCODONE  (OXY IR/ROXICODONE ) 5 MG immediate release tablet Take 5 mg by mouth every 6 (six) hours as needed for severe pain (pain score 7-10).     POTASSIUM PO Take 1 tablet by mouth daily.     PREMARIN vaginal cream Place 1 applicator vaginally as needed (urinary issues).     RABEprazole  (ACIPHEX ) 20 MG tablet Take 1 tablet (20 mg total) by mouth 2 (two) times a week. (Patient taking differently: Take 20 mg by mouth every other day.)     rosuvastatin  (CRESTOR ) 5 MG  tablet Take 1 tablet (5 mg total) by mouth daily. 90 tablet 3   STIOLTO RESPIMAT  2.5-2.5 MCG/ACT AERS INHALE TWO PUFFS INTO THE LUNGS DAILY 4 g 5   triamcinolone  cream (KENALOG ) 0.1 % Apply 1 Application topically 2 (two) times daily. As needed for itching/rash (Patient taking differently: Apply 1 Application topically as needed (rash/itching).) 453.6 g 0   vitamin B-12 (CYANOCOBALAMIN ) 1000 MCG tablet Take 1,000 mcg by mouth daily.     No current facility-administered medications for this visit.    SURGICAL HISTORY:  Past Surgical History:  Procedure Laterality Date   APPENDECTOMY     BRONCHIAL BIOPSY  10/20/2019   Procedure: BRONCHIAL BIOPSIES;  Surgeon: Shelah Lamar RAMAN, MD;  Location: [facility] ENDOSCOPY;  Service: Pulmonary;;   BRONCHIAL BRUSHINGS  10/20/2019   Procedure: BRONCHIAL BRUSHINGS;  Surgeon: Shelah Lamar RAMAN, MD;  Location: [facility] ENDOSCOPY;  Service: Pulmonary;;   BRONCHIAL NEEDLE ASPIRATION BIOPSY  10/20/2019   Procedure: BRONCHIAL NEEDLE ASPIRATION BIOPSIES;  Surgeon: Shelah Lamar RAMAN, MD;  Location: [facility] ENDOSCOPY;  Service: Pulmonary;;   BRONCHIAL WASHINGS  10/20/2019   Procedure: BRONCHIAL WASHINGS;   Surgeon: Shelah Lamar RAMAN, MD;  Location: [facility] ENDOSCOPY;  Service: Pulmonary;;   CARPAL TUNNEL RELEASE Right 2011   Dr. Camella   COLPORRHAPHY     posterior   HAND SURGERY Right 2016   Nerve surgery, Dr. Camella   IR KYPHO LUMBAR INC FX REDUCE BONE BX UNI/BIL CANNULATION INC/IMAGING  07/03/2023   OVARIAN CYST REMOVAL     RECTOCELE REPAIR  2011   w/TVH and sling   TONSILLECTOMY     TONSILLECTOMY     TOTAL KNEE ARTHROPLASTY Left 09/09/2018   Procedure: TOTAL KNEE ARTHROPLASTY, CORTISONE INJECTION RIGHT KNEE;  Surgeon: Ernie Cough, MD;  Location: [facility] ORS;  Service: Orthopedics;  Laterality: Left;  70 mins   TOTAL KNEE ARTHROPLASTY Right 04/16/2023   Procedure: TOTAL KNEE ARTHROPLASTY;  Surgeon: Ernie Cough, MD;  Location: [facility] ORS;  Service: Orthopedics;  Laterality: Right;   TOTAL VAGINAL HYSTERECTOMY  10/18/2009   rectocele repair, sling   TUBAL LIGATION Bilateral    VARICOSE VEIN SURGERY     VIDEO BRONCHOSCOPY WITH ENDOBRONCHIAL NAVIGATION N/A 10/20/2019   Procedure: VIDEO BRONCHOSCOPY WITH ENDOBRONCHIAL NAVIGATION;  Surgeon: Shelah Lamar RAMAN, MD;  Location: [facility] ENDOSCOPY;  Service: Pulmonary;  Laterality: N/A;    REVIEW OF SYSTEMS:  Constitutional: positive for anorexia, fatigue, and weight loss Eyes: negative Ears, nose, mouth, throat, and face: negative Respiratory: negative Cardiovascular: negative Gastrointestinal: positive for nausea Genitourinary:negative Integument/breast: negative Hematologic/lymphatic: negative Musculoskeletal:positive for arthralgias and back pain Neurological: positive for dizziness Behavioral/Psych: negative Endocrine: negative Allergic/Immunologic: negative   PHYSICAL EXAMINATION: General appearance: alert, cooperative, fatigued, and no distress Head: Normocephalic, without obvious abnormality, atraumatic Neck: no adenopathy, no JVD, supple, symmetrical, trachea midline, and thyroid  not enlarged, symmetric, no tenderness/mass/nodules Lymph nodes:  Cervical, supraclavicular, and axillary nodes normal. Resp: clear to auscultation bilaterally Back: symmetric, no curvature. ROM normal. No CVA tenderness. Cardio: regular rate and rhythm, S1, S2 normal, no murmur, click, rub or gallop GI: soft, non-tender; bowel sounds normal; no masses,  no organomegaly Extremities: extremities normal, atraumatic, no cyanosis or edema Neurologic: Alert and oriented X 3, normal strength and tone. Normal symmetric reflexes. Normal coordination and gait  ECOG PERFORMANCE STATUS: 1 - Symptomatic but completely ambulatory  Blood pressure 126/72, pulse 80, temperature (!) 96.2 F (35.7 C), resp. rate 17, height 5' 4 (1.626 m), weight 118 lb 8 oz (53.8 kg), last menstrual  period 08/21/2002, SpO2 98%.  LABORATORY DATA: Lab Results  Component Value Date   WBC 4.4 11/12/2023   HGB 12.6 11/12/2023   HCT 36.7 11/12/2023   MCV 91.1 11/12/2023   PLT 266 11/12/2023      Chemistry      Component Value Date/Time   NA 136 09/30/2023 1037   K 4.0 09/30/2023 1037   CL 100 09/30/2023 1037   CO2 30 09/30/2023 1037   BUN 16 09/30/2023 1037   CREATININE 1.22 (H) 09/30/2023 1037      Component Value Date/Time   CALCIUM  9.9 09/30/2023 1037   ALKPHOS 123 09/30/2023 1037   AST 14 (L) 09/30/2023 1037   ALT 10 09/30/2023 1037   BILITOT 0.4 09/30/2023 1037       RADIOGRAPHIC STUDIES: NM PET Image Restage (PS) Skull Base to Thigh (F-18 FDG) Result Date: 10/26/2023 CLINICAL DATA:  Subsequent treatment strategy for non-small cell lung cancer. EXAM: NUCLEAR MEDICINE PET SKULL BASE TO THIGH TECHNIQUE: 6.1 mCi F-18 FDG was injected intravenously. Full-ring PET imaging was performed from the skull base to thigh after the radiotracer. CT data was obtained and used for attenuation correction and anatomic localization. Fasting blood glucose: 104 mg/dl COMPARISON:  Chest CT 91/88/7974.  PET-CT 03/01/2023. FINDINGS: Mediastinal blood pool activity: SUV max 3.2 Liver activity:  SUV max NA NECK: No hypermetabolic lymph nodes in the neck. Incidental CT findings: None. CHEST: Small left axillary nodes new since prior PET-CT and similar to prior chest CT shows low level hypermetabolism with SUV max = 2.9. The consolidative post treatment scarring in the left suprahilar and paraspinal lung shows low level hypermetabolism with SUV max = 5.8. Focal hypermetabolism in the retro hilar right lung associated with scarring with SUV max = 4.5. Incidental CT findings: Coronary artery calcification is evident. Mild atherosclerotic calcification is noted in the wall of the thoracic aorta. 3.6 cm low-density lesion in the no mediastinum (95/4) is similar to 09/30/2023 but progressive since 03/01/2023. No hypermetabolism. ABDOMEN/PELVIS: No abnormal hypermetabolic activity within the liver, pancreas, or spleen. Right adrenal nodule described as enlarging on recent CT scan shows no hypermetabolism on PET imaging ( SUV max = 2.8). Ill-defined 12 mm short axis left para-aortic node on 125/4 is markedly hypermetabolic with SUV max = 9.4. No hypermetabolic lymphadenopathy in the pelvis. Incidental CT findings: Mild to moderate left hydronephrosis is stable since CT of 09/30/2023 and new since PET-CT of 03/01/2023. No obstructing stone at the UPJ and left ureter is nondilated. No obstructing mass lesion evident. There is mild atherosclerotic calcification of the abdominal aorta without aneurysm. Insert diverticulosis left SKELETON: Multiple new hypermetabolic skeletal metastases are identified. 1.4 cm sclerotic lesion in the L5 vertebral body demonstrates SUV max = 9.9. Hypermetabolic right sacral metastasis demonstrates SUV max = 7.0. Additional hypermetabolic lesions are seen in the bony pelvis, sacrum and thoracolumbar spine. Hypermetabolic lesion in the right T11 vertebral body demonstrates SUV max = 6.5. Incidental CT findings: Prior upper lumbar vertebral augmentation. IMPRESSION: 1. Interval progression of  disease with multiple new hypermetabolic skeletal metastases. 2. Hypermetabolic left para-aortic lymph node in the abdomen is consistent with metastatic disease, new since prior PET-CT. 3. Small left axillary nodes show low level hypermetabolism, nonspecific but concerning for metastatic involvement. 4. Mild to moderate left hydronephrosis without obstructing stone or mass lesion evident. This finding is stable since 09/30/2023, but new since prior PET-CT of 03/01/2023. 5. Right adrenal nodule described as enlarging on recent CT scan shows no hypermetabolism on  PET imaging. 6.  Aortic Atherosclerosis (ICD10-I70.0). Electronically Signed   By: Camellia Candle M.D.   On: 10/26/2023 06:48   DG BONE DENSITY (DXA) Result Date: 10/18/2023 EXAM: DUAL X-RAY ABSORPTIOMETRY (DXA) FOR BONE MINERAL DENSITY 10/17/2023 12:04 pm CLINICAL DATA:  73 year old Female Postmenopausal. Hypo estrogen status History of fragility fracture. History of vertebral fracture. Patient is or has been on glucocorticoid therapy. TECHNIQUE: An axial (e.g., hips, spine) and/or appendicular (e.g., radius) exam was performed, as appropriate, using GE Psychologist, sport and exercise at [facility]. Images are obtained for bone mineral density measurement and are not obtained for diagnostic purposes. [id number] Exclusions: Lumbar spine due surgery. COMPARISON:  None. New baseline. FINDINGS: Scan quality: Good. LEFT FEMORAL NECK: BMD (in g/cm2): 0.914 T-score: -0.9 Z-score: 0.9 LEFT TOTAL HIP: BMD (in g/cm2): 0.822 T-score: -1.5 Z-score: 0.1 RIGHT FEMORAL NECK: BMD (in g/cm2): 0.848 T-score: -1.4 Z-score: 0.4 RIGHT TOTAL HIP: BMD (in g/cm2): 0.800 T-score: -1.6 Z-score: 0.0 LEFT FOREARM (RADIUS 33%): BMD (in g/cm2): 0.564 T-score: -2.0 Z-score: 0.1 FRAX 10-YEAR PROBABILITY OF FRACTURE: 10-year fracture risk is performed using the [employer] FRAX calculator based on patient-reported risk factors. Major osteoporotic fracture: 14.6% Hip  fracture: 2.3% Other situations known to alter the reliability of the FRAX score should be considered when making treatment decisions, including chronic glucocorticoid use and past treatments. Further guidance on treatment can be found at the [facility] Osteoporosis Foundation's website [email or web address]://www.[email or web address]/. IMPRESSION: Osteopenia based on BMD. Fracture risk is increased. Increased risk is based on low BMD, and history of vertebral fracture. RECOMMENDATIONS: 1. All patients should optimize calcium  and vitamin D  intake. 2. Consider FDA-approved medical therapies in postmenopausal women and men aged 68 years and older, based on the following: - A hip or vertebral (clinical or morphometric) fracture - T-score less than or equal to -2.5 and secondary causes have been excluded. - Low bone mass (T-score between -1.0 and -2.5) and a 10-year probability of a hip fracture greater than or equal to 3% or a 10-year probability of a major osteoporosis-related fracture greater than or equal to 20% based on the [place] -adapted WHO algorithm. - Clinician judgment and/or patient preferences may indicate treatment for people with 10-year fracture probabilities above or below these levels 3. Patients with diagnosis of osteoporosis or at high risk for fracture should have regular bone mineral density tests. For patients eligible for Medicare, routine testing is allowed once every 2 years. The testing frequency can be increased to one year for patients who have rapidly progressing disease, those who are receiving or discontinuing medical therapy to restore bone mass, or have additional risk factors. Electronically Signed   By: Dina  Arceo M.D.   On: 10/18/2023 05:07     ASSESSMENT AND PLAN: This is a very pleasant 73 years old white female recently diagnosed with a stage IIIc/IV (T3, N3, M0/M1a) non-small cell lung cancer, adenocarcinoma presented with large left upper lobe lung mass in addition to left infrahilar, right hilar and subcarinal  lymphadenopathy and small left pleural effusion diagnosed in August 2021 with positive EGFR mutation in exon 21 (L858R). The patient completed a course of concurrent chemoradiation with weekly carboplatin  and paclitaxel  status post 7 cycles.  She tolerated her treatment well except for fatigue as well as a skin burn and cough. Her scan showed mild improvement of the left upper lobe lung mass as well as the mediastinal lymphadenopathy.  The patient continues to have small left pleural effusion that is suspicious given her presentation.  She is currently on treatment with Tagrisso  80 mg p.o. daily started on January 31, 2020.  She is status post 42 months of treatment.  Her treatment is currently on hold for the last 4 months because of the adverse effect and fatigue. She also underwent kyphoplasty of L2 vertebral lesion and the biopsy was negative for malignancy. She had a PET scan performed recently.  I personally and independently reviewed the PET scan images and discussed the result and showed the images to the patient and her daughter.  Unfortunately her PET scan showed clear evidence for disease progression especially with bone metastasis in addition to increasing activity in the left hilar area and suspicious left axillary lymphadenopathy. 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 Recent PET scan shows disease progression with bone metastases, activity in previously radiated area, and lymph node involvement in the left axilla and portohepatis area. Off Tagrisso  for four months due to side effects, leading to disease progression. - Resume Tagrisso  80 mg daily - Initiate chemotherapy with carboplatin  and pemetrexed (Alimta) every three weeks for four cycles - After four cycles, continue pemetrexed with Tagrisso  - Order  B12 injection today - Prescribe folic acid  daily - Refer to radiation oncology for evaluation of bone metastases - The patient understands that this treatment is palliative in nature with no cure for her condition and the goal of the treatment is prolongation of her survival and palliation of her symptoms.  Cancer-related pain due to bone metastases Pain likely due to multiple areas of bone metastasis as seen on PET scan, exacerbated while sitting, possibly due to bone disease progression. - Refer to radiation oncology for evaluation and potential radiation therapy to painful bone metastases  Cancer-related weight loss and decreased appetite Weight loss and decreased appetite potentially due to cancer progression. Previous use of steroids improved appetite. - Prescribe Medrol  dose pack to boost appetite - Administer steroids the day before, the day of, and the day after chemotherapy  Urinary tract infection with associated confusion Recent urinary tract infection treated with ceftriaxone, ampicillin, and mempropanolol. Confusion likely secondary to UTI, common in older adults. Current treatment with antibiotics by urologist. - Continue current antibiotic treatment for UTI as prescribed by urologist  Follow-Up Follow-up plan discussed to monitor treatment response and manage side effects. - Schedule follow-up appointment in two weeks - Check one week after starting chemotherapy to assess response and side effects The patient was advised to call immediately if she has any other concerning symptoms in the interval.   The patient voices understanding of current disease status and treatment options and is in agreement with the current care plan.  All questions were answered. The patient knows to call the clinic with any problems, questions or concerns. We can certainly see the patient much sooner if necessary. The total time spent in the appointment was 55 minutes including review of chart and  various tests results, discussions about plan of care and coordination of care plan .  Disclaimer: This note was dictated with voice recognition software. Similar sounding words can inadvertently be transcribed and may not be corrected upon review.

## 2023-11-13 ENCOUNTER — Encounter: Payer: Self-pay | Admitting: Internal Medicine

## 2023-11-13 ENCOUNTER — Ambulatory Visit
Admission: RE | Admit: 2023-11-13 | Discharge: 2023-11-13 | Disposition: A | Discharge status: Home / Self Care | Source: Ambulatory Visit | Attending: Internal Medicine | Admitting: Internal Medicine

## 2023-11-13 ENCOUNTER — Telehealth: Payer: Self-pay | Admitting: Physician Assistant

## 2023-11-13 ENCOUNTER — Other Ambulatory Visit (HOSPITAL_COMMUNITY): Payer: Self-pay

## 2023-11-13 DIAGNOSIS — Z1231 Encounter for screening mammogram for malignant neoplasm of breast: Secondary | ICD-10-CM

## 2023-11-13 NOTE — Telephone Encounter (Signed)
 Scheduled appointments with the patient. She is aware of the appointment details and is active on MyChart.

## 2023-11-14 ENCOUNTER — Other Ambulatory Visit: Payer: Self-pay

## 2023-11-14 NOTE — Progress Notes (Signed)
 Pharmacist Chemotherapy Monitoring - Initial Assessment    Anticipated start date: 11/21/23   The following has been reviewed per standard work regarding the patient's treatment regimen: The patient's diagnosis, treatment plan and drug doses, and organ/hematologic function Lab orders and baseline tests specific to treatment regimen  The treatment plan start date, drug sequencing, and pre-medications Prior authorization status  Patient's documented medication list, including drug-drug interaction screen and prescriptions for anti-emetics and supportive care specific to the treatment regimen The drug concentrations, fluid compatibility, administration routes, and timing of the medications to be used The patient's access for treatment and lifetime cumulative dose history, if applicable  The patient's medication allergies and previous infusion related reactions, if applicable   Changes made to treatment plan:  treatment plan date  Follow up needed:  N/A   Bridgett Leotis Helling, RPH, BCPS, BCOP 11/14/2023   9:53 AM

## 2023-11-15 NOTE — Progress Notes (Signed)
 Histology and Location of Primary Cancer:   Sites of Visceral and Bony Metastatic Disease:    Location(s) of Symptomatic Metastases:   Past/Anticipated chemotherapy by medical oncology, if any:    Pain on a scale of 0-10 is: 6 patient reports constant back pain.    If Spine Met(s), symptoms, if any, include: Bowel/Bladder retention or incontinence (please describe): Reports constipation at times. Numbness or weakness in extremities (please describe): Yes reports numbness to feet.  Current Decadron  regimen, if applicable: Yes patient to start taking 4mg  BID tomorrow.   Ambulatory status? Walker? Wheelchair? Ambulatory with assistance. Wheel chair for long distances.   SAFETY ISSUES: Prior radiation? Yes left lung 11/26/19-01/06/20 Pacemaker/ICD? no Possible current pregnancy? no Is the patient on methotrexate? no  Current Complaints / other details:    BP 103/63 (BP Location: Right Arm, Patient Position: Sitting, Cuff Size: Normal)   Pulse 64   Temp 97.9 F (36.6 C)   Resp 20   Ht 5' 5.5 (1.664 m)   Wt 116 lb 9.6 oz (52.9 kg)   LMP 08/21/2002   SpO2 100%   BMI 19.11 kg/m

## 2023-11-16 NOTE — Progress Notes (Incomplete)
 Radiation Oncology         [phone] ________________________________  Initial Outpatient Re-Consultation  Name: Linda Woods MRN: [id number]  Date: 11/19/2023  DOB: 1950/10/21  RR:Hnoipwh, Norleen, MD  Sherrod Sherrod, MD   REFERRING PHYSICIAN: Sherrod Sherrod, MD  DIAGNOSIS: There were no encounter diagnoses.  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 female who is accompanied by ***. she is seen as a courtesy of Dr. Gatha for an opinion concerning radiation therapy as part of management for her metastatic disease. She is known to me for her history of radiation for non-small cell lung cancer a positive EGFR mutation [id number], diagnosed in 2021 and was last seen in office on 02/29/20 for a follow up visit.    During a recent trip to [place] patient reported a sudden onset of  confusion, headache, and fatigue. She admitted to a hospital where blood work showed an elevated leukocyte count, but normal INR. An abdominal ultrasound and brain CT scan were unremarkable, and a lumbar puncture was performed with normal results. She was treated for a urinary tract infection with antibiotics including ceftriaxone, ampicillin, and mempropanolol, and was also given acyclovir, which was later discontinued.   [place], she presented for a follow up with Dr. Gatha who recommend undergoing a PET scan to evaluate symptoms. Scan was performed on 10/18/23 showing an interval progression of disease with multiple new hypermetabolic skeletal metastases; new hypermetabolic left para-aortic lymph node in the abdomen is consistent with metastatic disease; small left axillary nodes show low level hypermetabolism, nonspecific but concerning for metastatic involvement and mild to moderate left hydronephrosis without obstructing stone or mass lesion evident which is  stable since 09/30/2023. Scan also noted that right adrenal nodule described as enlarging on recent CT scan shows no hypermetabolism.    In light of findings, Dr. Gatha recommended initiating chemotherapy with carboplatin  and pemetrexed (Alimta) every three weeks for four cycles and resuming Tagrisso  80 mg daily. In light of bone metastasis, he also recommended undergoing palliative radiation therapy.    Of note: she also underwent a bilateral screening mammogram on 11/13/23 showing no mammographic evidence of malignancy.    PREVIOUS RADIATION THERAPY: Yes ***  11/26/2019 through 01/06/2020 Site Technique Total Dose (Gy) Dose per Fx (Gy) Completed Fx Beam Energies  Lung, Left: Lung_Lt IMRT 46/46 2 23/23 6X  Lung, Left: Lung_Lt_Bst IMRT 14/14 2 7/7 6X    PAST MEDICAL HISTORY:  Past Medical History:  Diagnosis Date   Anemia    as a child   Anxiety    Asthma 10/19/2020   Bursitis    Chronic reflux esophagitis    Complication of anesthesia    Diarrhea, functional    Diverticulosis    GERD (gastroesophageal reflux disease)    History of kidney stones    Hypertension    Knee pain    right knee-seeing ortho   lung ca 09/2019   Osteoarthritis    Plantar fasciitis    Pneumonia    PONV (postoperative nausea and vomiting)    has used the patch before and that helps   Pure hypercholesterolemia    RLS (restless legs syndrome)    Superficial vein thrombosis    Thyroid  disease    hypothyroid    PAST SURGICAL HISTORY: Past Surgical History:  Procedure Laterality Date   APPENDECTOMY  BRONCHIAL BIOPSY  10/20/2019   Procedure: BRONCHIAL BIOPSIES;  Surgeon: Shelah Lamar RAMAN, MD;  Location: [facility] ENDOSCOPY;  Service: Pulmonary;;   BRONCHIAL BRUSHINGS  10/20/2019   Procedure: BRONCHIAL BRUSHINGS;  Surgeon: Shelah Lamar RAMAN, MD;  Location: [facility] ENDOSCOPY;  Service: Pulmonary;;   BRONCHIAL NEEDLE ASPIRATION BIOPSY  10/20/2019   Procedure: BRONCHIAL NEEDLE ASPIRATION BIOPSIES;  Surgeon: Shelah Lamar RAMAN, MD;  Location: [facility] ENDOSCOPY;  Service: Pulmonary;;   BRONCHIAL WASHINGS  10/20/2019   Procedure: BRONCHIAL WASHINGS;  Surgeon: Shelah Lamar RAMAN, MD;  Location: [facility] ENDOSCOPY;  Service: Pulmonary;;   CARPAL TUNNEL RELEASE Right 2011   Dr. Camella   COLPORRHAPHY     posterior   HAND SURGERY Right 2016   Nerve surgery, Dr. Camella   IR KYPHO LUMBAR INC FX REDUCE BONE BX UNI/BIL CANNULATION INC/IMAGING  07/03/2023   OVARIAN CYST REMOVAL     RECTOCELE REPAIR  2011   w/TVH and sling   TONSILLECTOMY     TONSILLECTOMY     TOTAL KNEE ARTHROPLASTY Left 09/09/2018   Procedure: TOTAL KNEE ARTHROPLASTY, CORTISONE INJECTION RIGHT KNEE;  Surgeon: Ernie Cough, MD;  Location: [facility] ORS;  Service: Orthopedics;  Laterality: Left;  70 mins   TOTAL KNEE ARTHROPLASTY Right 04/16/2023   Procedure: TOTAL KNEE ARTHROPLASTY;  Surgeon: Ernie Cough, MD;  Location: [facility] ORS;  Service: Orthopedics;  Laterality: Right;   TOTAL VAGINAL HYSTERECTOMY  10/18/2009   rectocele repair, sling   TUBAL LIGATION Bilateral    VARICOSE VEIN SURGERY     VIDEO BRONCHOSCOPY WITH ENDOBRONCHIAL NAVIGATION N/A 10/20/2019   Procedure: VIDEO BRONCHOSCOPY WITH ENDOBRONCHIAL NAVIGATION;  Surgeon: Shelah Lamar RAMAN, MD;  Location: [facility] ENDOSCOPY;  Service: Pulmonary;  Laterality: N/A;    FAMILY HISTORY:  Family History  Problem Relation Age of Onset   Cervical cancer Mother    Heart failure Mother    Heart failure Father    Diabetes Father    Cancer Brother    Breast cancer Maternal Aunt    Breast cancer Paternal Aunt     SOCIAL HISTORY:  Social History   Tobacco Use   Smoking status: Never   Smokeless tobacco: Never  Vaping Use   Vaping status: Never Used  Substance Use Topics   Alcohol use: Yes    Alcohol/week: 1.0 standard drink of alcohol    Types: 1 Glasses of wine per week   Drug use: No    ALLERGIES: No Known Allergies  MEDICATIONS:  Current Outpatient Medications  Medication Sig Dispense Refill   albuterol   (VENTOLIN  HFA) 108 (90 Base) MCG/ACT inhaler Inhale 2 puffs into the lungs every 6 (six) hours as needed for wheezing or shortness of breath. 8.5 g 0   alum & mag hydroxide-simeth (MAALOX/MYLANTA) 200-200-20 MG/5ML suspension Take 15 mLs by mouth every 6 (six) hours as needed for indigestion or heartburn. 355 mL 0   benzonatate  (TESSALON ) 100 MG capsule Take 1 capsule (100 mg total) by mouth 2 (two) times daily. 20 capsule 0   chlorhexidine  (HIBICLENS ) 4 % external liquid Apply 15 mLs (1 Application total) topically as directed for 30 doses. Use as directed daily for 5 days every other week for 6 weeks. 946 mL 1   Cholecalciferol (VITAMIN D ) 50 MCG (2000 UT) tablet Take 2,000 Units by mouth daily.     ciprofloxacin  (CIPRO ) 100 MG tablet Take 100 mg by mouth 2 (two) times daily.     dexamethasone  (DECADRON ) 4 MG tablet Take 1 tab 2 times daily starting  day before pemetrexed. Then take 2 tabs daily x 3 days starting day after carboplatin . Take with food. 30 tablet 1   docusate sodium  (COLACE) 100 MG capsule Take 1 capsule (100 mg total) by mouth 2 (two) times daily. 60 capsule 2   famotidine  (PEPCID ) 40 MG tablet Take 40 mg by mouth at bedtime.     ferrous sulfate  325 (65 FE) MG tablet Take 325 mg by mouth daily with breakfast.     fexofenadine (ALLEGRA) 180 MG tablet Take 180 mg by mouth daily as needed for allergies.     fluticasone  (FLONASE ) 50 MCG/ACT nasal spray Place 1 spray into both nostrils as needed for allergies.     folic acid  (FOLVITE ) 1 MG tablet Take 1 tablet (1 mg total) by mouth daily. Start 7 days before pemetrexed chemotherapy. Continue until 21 days after pemetrexed completed. 100 tablet 3   guaiFENesin -dextromethorphan  (ROBITUSSIN DM) 100-10 MG/5ML syrup Take 5 mLs by mouth every 4 (four) hours as needed for cough (chest congestion). 118 mL 0   levothyroxine  (SYNTHROID ) 100 MCG tablet Take 1 tablet (100 mcg total) by mouth daily. One po qd 90 tablet 0   lidocaine  (LIDODERM ) 5 %  Place 1 patch onto the skin daily.     lidocaine -prilocaine  (EMLA ) cream Apply to affected area once 30 g 3   LORazepam  (ATIVAN ) 0.5 MG tablet Take 0.5 mg by mouth at bedtime.      MAGNESIUM  PO Take 1 tablet by mouth daily.     melatonin 5 MG TABS Take 5 mg by mouth at bedtime as needed (sleep).     methylPREDNISolone  (MEDROL  DOSEPAK) 4 MG TBPK tablet Use as instructed 21 tablet 0   metoprolol  succinate (TOPROL -XL) 50 MG 24 hr tablet Take 1 tablet (50 mg total) by mouth daily. 90 tablet 3   MYRBETRIQ  50 MG TB24 tablet Take 50 mg by mouth daily.     ondansetron  (ZOFRAN ) 4 MG tablet Take 1 tablet (4 mg total) by mouth every 6 (six) hours as needed for nausea. 20 tablet 0   osimertinib  mesylate (TAGRISSO ) 80 MG tablet Take 1 tablet (80 mg total) by mouth daily. 30 tablet 3   oxyCODONE  (OXY IR/ROXICODONE ) 5 MG immediate release tablet Take 5 mg by mouth every 6 (six) hours as needed for severe pain (pain score 7-10).     POTASSIUM PO Take 1 tablet by mouth daily.     PREMARIN vaginal cream Place 1 applicator vaginally as needed (urinary issues).     prochlorperazine  (COMPAZINE ) 10 MG tablet Take 1 tablet (10 mg total) by mouth every 6 (six) hours as needed for nausea or vomiting. 30 tablet 1   RABEprazole  (ACIPHEX ) 20 MG tablet Take 1 tablet (20 mg total) by mouth 2 (two) times a week. (Patient taking differently: Take 20 mg by mouth every other day.)     rosuvastatin  (CRESTOR ) 5 MG tablet Take 1 tablet (5 mg total) by mouth daily. 90 tablet 3   STIOLTO RESPIMAT  2.5-2.5 MCG/ACT AERS INHALE TWO PUFFS INTO THE LUNGS DAILY 4 g 5   triamcinolone  cream (KENALOG ) 0.1 % Apply 1 Application topically 2 (two) times daily. As needed for itching/rash (Patient taking differently: Apply 1 Application topically as needed (rash/itching).) 453.6 g 0   vitamin B-12 (CYANOCOBALAMIN ) 1000 MCG tablet Take 1,000 mcg by mouth daily.     No current facility-administered medications for this visit.    REVIEW OF SYSTEMS:   A 10+ POINT REVIEW OF SYSTEMS WAS OBTAINED including  neurology, dermatology, psychiatry, cardiac, respiratory, lymph, extremities, GI, GU, musculoskeletal, constitutional, reproductive, HEENT. ***   PHYSICAL EXAM:  vitals were not taken for this visit.   General: Alert and oriented, in no acute distress HEENT: Head is normocephalic. Extraocular movements are intact. Oropharynx is clear. Neck: Neck is supple, no palpable cervical or supraclavicular lymphadenopathy. Heart: Regular in rate and rhythm with no murmurs, rubs, or gallops. Chest: Clear to auscultation bilaterally, with no rhonchi, wheezes, or rales. Abdomen: Soft, nontender, nondistended, with no rigidity or guarding. Extremities: No cyanosis or edema. Lymphatics: see Neck Exam Skin: No concerning lesions. Musculoskeletal: symmetric strength and muscle tone throughout. Neurologic: Cranial nerves II through XII are grossly intact. No obvious focalities. Speech is fluent. Coordination is intact. Psychiatric: Judgment and insight are intact. Affect is appropriate. ***  ECOG = ***  0 - Asymptomatic (Fully active, able to carry on all predisease activities without restriction)  1 - Symptomatic but completely ambulatory (Restricted in physically strenuous activity but ambulatory and able to carry out work of a light or sedentary nature. For example, light housework, office work)  2 - Symptomatic, <50% in bed during the day (Ambulatory and capable of all self care but unable to carry out any work activities. Up and about more than 50% of waking hours)  3 - Symptomatic, >50% in bed, but not bedbound (Capable of only limited self-care, confined to bed or chair 50% or more of waking hours)  4 - Bedbound (Completely disabled. Cannot carry on any self-care. Totally confined to bed or chair)  5 - Death   Raylene MM, Creech RH, Tormey DC, et al. [phone]). Toxicity and response criteria of the [facility]. Am. DOROTHA Bridges.  Oncol. 5 (6): 649-55  LABORATORY DATA:  Lab Results  Component Value Date   WBC 4.4 11/12/2023   HGB 12.6 11/12/2023   HCT 36.7 11/12/2023   MCV 91.1 11/12/2023   PLT 266 11/12/2023   NEUTROABS 2.9 11/12/2023   Lab Results  Component Value Date   NA 136 11/12/2023   K 3.5 11/12/2023   CL 100 11/12/2023   CO2 29 11/12/2023   GLUCOSE 131 (H) 11/12/2023   BUN 14 11/12/2023   CREATININE 0.80 11/12/2023   CALCIUM  9.6 11/12/2023      RADIOGRAPHY: MM 3D SCREENING MAMMOGRAM BILATERAL BREAST Result Date: 11/15/2023 CLINICAL DATA:  Screening. EXAM: DIGITAL SCREENING BILATERAL MAMMOGRAM WITH TOMOSYNTHESIS AND CAD TECHNIQUE: Bilateral screening digital craniocaudal and mediolateral oblique mammograms were obtained. Bilateral screening digital breast tomosynthesis was performed. The images were evaluated with computer-aided detection. COMPARISON:  Previous exam(s). ACR Breast Density Category b: There are scattered areas of fibroglandular density. FINDINGS: There are no findings suspicious for malignancy. IMPRESSION: No mammographic evidence of malignancy. A result letter of this screening mammogram will be mailed directly to the patient. RECOMMENDATION: Screening mammogram in one year. (Code:[id number]) BI-RADS CATEGORY  1: Negative. Electronically Signed   By: Dina  Arceo M.D.   On: 11/15/2023 09:26   NM PET Image Restage (PS) Skull Base to Thigh (F-18 FDG) Result Date: 10/26/2023 CLINICAL DATA:  Subsequent treatment strategy for non-small cell lung cancer. EXAM: NUCLEAR MEDICINE PET SKULL BASE TO THIGH TECHNIQUE: 6.1 mCi F-18 FDG was injected intravenously. Full-ring PET imaging was performed from the skull base to thigh after the radiotracer. CT data was obtained and used for attenuation correction and anatomic localization. Fasting blood glucose: 104 mg/dl COMPARISON:  Chest CT 91/88/7974.  PET-CT 03/01/2023. FINDINGS: Mediastinal blood pool activity: SUV max 3.2  Liver activity: SUV max NA NECK: No  hypermetabolic lymph nodes in the neck. Incidental CT findings: None. CHEST: Small left axillary nodes new since prior PET-CT and similar to prior chest CT shows low level hypermetabolism with SUV max = 2.9. The consolidative post treatment scarring in the left suprahilar and paraspinal lung shows low level hypermetabolism with SUV max = 5.8. Focal hypermetabolism in the retro hilar right lung associated with scarring with SUV max = 4.5. Incidental CT findings: Coronary artery calcification is evident. Mild atherosclerotic calcification is noted in the wall of the thoracic aorta. 3.6 cm low-density lesion in the no mediastinum (95/4) is similar to 09/30/2023 but progressive since 03/01/2023. No hypermetabolism. ABDOMEN/PELVIS: No abnormal hypermetabolic activity within the liver, pancreas, or spleen. Right adrenal nodule described as enlarging on recent CT scan shows no hypermetabolism on PET imaging ( SUV max = 2.8). Ill-defined 12 mm short axis left para-aortic node on 125/4 is markedly hypermetabolic with SUV max = 9.4. No hypermetabolic lymphadenopathy in the pelvis. Incidental CT findings: Mild to moderate left hydronephrosis is stable since CT of 09/30/2023 and new since PET-CT of 03/01/2023. No obstructing stone at the UPJ and left ureter is nondilated. No obstructing mass lesion evident. There is mild atherosclerotic calcification of the abdominal aorta without aneurysm. Insert diverticulosis left SKELETON: Multiple new hypermetabolic skeletal metastases are identified. 1.4 cm sclerotic lesion in the L5 vertebral body demonstrates SUV max = 9.9. Hypermetabolic right sacral metastasis demonstrates SUV max = 7.0. Additional hypermetabolic lesions are seen in the bony pelvis, sacrum and thoracolumbar spine. Hypermetabolic lesion in the right T11 vertebral body demonstrates SUV max = 6.5. Incidental CT findings: Prior upper lumbar vertebral augmentation. IMPRESSION: 1. Interval progression of disease with  multiple new hypermetabolic skeletal metastases. 2. Hypermetabolic left para-aortic lymph node in the abdomen is consistent with metastatic disease, new since prior PET-CT. 3. Small left axillary nodes show low level hypermetabolism, nonspecific but concerning for metastatic involvement. 4. Mild to moderate left hydronephrosis without obstructing stone or mass lesion evident. This finding is stable since 09/30/2023, but new since prior PET-CT of 03/01/2023. 5. Right adrenal nodule described as enlarging on recent CT scan shows no hypermetabolism on PET imaging. 6.  Aortic Atherosclerosis (ICD10-I70.0). Electronically Signed   By: Camellia Candle M.D.   On: 10/26/2023 06:48   DG BONE DENSITY (DXA) Result Date: 10/18/2023 EXAM: DUAL X-RAY ABSORPTIOMETRY (DXA) FOR BONE MINERAL DENSITY 10/17/2023 12:04 pm CLINICAL DATA:  73 year old Female Postmenopausal. Hypo estrogen status History of fragility fracture. History of vertebral fracture. Patient is or has been on glucocorticoid therapy. TECHNIQUE: An axial (e.g., hips, spine) and/or appendicular (e.g., radius) exam was performed, as appropriate, using GE Psychologist, sport and exercise at [facility]. Images are obtained for bone mineral density measurement and are not obtained for diagnostic purposes. [id number] Exclusions: Lumbar spine due surgery. COMPARISON:  None. New baseline. FINDINGS: Scan quality: Good. LEFT FEMORAL NECK: BMD (in g/cm2): 0.914 T-score: -0.9 Z-score: 0.9 LEFT TOTAL HIP: BMD (in g/cm2): 0.822 T-score: -1.5 Z-score: 0.1 RIGHT FEMORAL NECK: BMD (in g/cm2): 0.848 T-score: -1.4 Z-score: 0.4 RIGHT TOTAL HIP: BMD (in g/cm2): 0.800 T-score: -1.6 Z-score: 0.0 LEFT FOREARM (RADIUS 33%): BMD (in g/cm2): 0.564 T-score: -2.0 Z-score: 0.1 FRAX 10-YEAR PROBABILITY OF FRACTURE: 10-year fracture risk is performed using the [facility] FRAX calculator based on patient-reported risk factors. Major osteoporotic fracture: 14.6% Hip fracture: 2.3% Other  situations known to alter the reliability of the FRAX score should be considered when  making treatment decisions, including chronic glucocorticoid use and past treatments. Further guidance on treatment can be found at the [facility]'s website [email or web address]://www.patton.com/. IMPRESSION: Osteopenia based on BMD. Fracture risk is increased. Increased risk is based on low BMD, and history of vertebral fracture. RECOMMENDATIONS: 1. All patients should optimize calcium  and vitamin D  intake. 2. Consider FDA-approved medical therapies in postmenopausal women and men aged 89 years and older, based on the following: - A hip or vertebral (clinical or morphometric) fracture - T-score less than or equal to -2.5 and secondary causes have been excluded. - Low bone mass (T-score between -1.0 and -2.5) and a 10-year probability of a hip fracture greater than or equal to 3% or a 10-year probability of a major osteoporosis-related fracture greater than or equal to 20% based on the [place] -adapted WHO algorithm. - Clinician judgment and/or patient preferences may indicate treatment for people with 10-year fracture probabilities above or below these levels 3. Patients with diagnosis of osteoporosis or at high risk for fracture should have regular bone mineral density tests. For patients eligible for Medicare, routine testing is allowed once every 2 years. The testing frequency can be increased to one year for patients who have rapidly progressing disease, those who are receiving or discontinuing medical therapy to restore bone mass, or have additional risk factors. Electronically Signed   By: Dina  Arceo M.D.   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  We discussed the natural history of *** and general treatment, highlighting the role of  radiotherapy in the management.  We discussed the available radiation techniques, and focused on the details of logistics and delivery.  We reviewed the anticipated acute and late sequelae associated with radiation in this setting.  The patient was encouraged to ask questions that I answered to the best of my ability. *** A patient consent form was discussed and signed.  We retained a copy for our records.  The patient would like to proceed with radiation and will be scheduled for CT simulation.  PLAN: ***    *** minutes of total time was spent for this patient encounter, including preparation, face-to-face counseling with the patient and coordination of care, physical exam, and documentation of the encounter.   ------------------------------------------------  Lynwood CHARM Nasuti, PhD, MD  This document serves as a record of services personally performed by Lynwood Nasuti, MD. It was created on his behalf by Reymundo Cartwright, a trained medical scribe. The creation of this record is based on the scribe's personal observations and the provider's statements to them. This document has been checked and approved by the attending provider.

## 2023-11-19 ENCOUNTER — Encounter: Payer: Self-pay | Admitting: Radiation Oncology

## 2023-11-19 ENCOUNTER — Other Ambulatory Visit: Payer: Self-pay | Admitting: Radiation Oncology

## 2023-11-19 ENCOUNTER — Encounter: Payer: Self-pay | Admitting: Internal Medicine

## 2023-11-19 ENCOUNTER — Ambulatory Visit
Admission: RE | Admit: 2023-11-19 | Discharge: 2023-11-19 | Disposition: A | Discharge status: Home / Self Care | Source: Ambulatory Visit | Attending: Radiation Oncology | Admitting: Radiation Oncology

## 2023-11-19 VITALS — BP 103/63 | HR 64 | Temp 97.9°F | Resp 20 | Ht 65.5 in | Wt 116.6 lb

## 2023-11-19 DIAGNOSIS — Z79890 Hormone replacement therapy (postmenopausal): Secondary | ICD-10-CM | POA: Insufficient documentation

## 2023-11-19 DIAGNOSIS — E7800 Pure hypercholesterolemia, unspecified: Secondary | ICD-10-CM | POA: Diagnosis not present

## 2023-11-19 DIAGNOSIS — Z87442 Personal history of urinary calculi: Secondary | ICD-10-CM | POA: Diagnosis not present

## 2023-11-19 DIAGNOSIS — M1990 Unspecified osteoarthritis, unspecified site: Secondary | ICD-10-CM | POA: Insufficient documentation

## 2023-11-19 DIAGNOSIS — E039 Hypothyroidism, unspecified: Secondary | ICD-10-CM | POA: Insufficient documentation

## 2023-11-19 DIAGNOSIS — Z923 Personal history of irradiation: Secondary | ICD-10-CM | POA: Insufficient documentation

## 2023-11-19 DIAGNOSIS — C7951 Secondary malignant neoplasm of bone: Secondary | ICD-10-CM

## 2023-11-19 DIAGNOSIS — Z87440 Personal history of urinary (tract) infections: Secondary | ICD-10-CM | POA: Diagnosis not present

## 2023-11-19 DIAGNOSIS — Z808 Family history of malignant neoplasm of other organs or systems: Secondary | ICD-10-CM | POA: Diagnosis not present

## 2023-11-19 DIAGNOSIS — K2100 Gastro-esophageal reflux disease with esophagitis, without bleeding: Secondary | ICD-10-CM | POA: Diagnosis not present

## 2023-11-19 DIAGNOSIS — C3492 Malignant neoplasm of unspecified part of left bronchus or lung: Secondary | ICD-10-CM

## 2023-11-19 DIAGNOSIS — C3412 Malignant neoplasm of upper lobe, left bronchus or lung: Secondary | ICD-10-CM | POA: Diagnosis not present

## 2023-11-19 DIAGNOSIS — Z1509 Genetic susceptibility to other malignant neoplasm: Secondary | ICD-10-CM | POA: Insufficient documentation

## 2023-11-19 DIAGNOSIS — Z803 Family history of malignant neoplasm of breast: Secondary | ICD-10-CM | POA: Diagnosis not present

## 2023-11-19 DIAGNOSIS — I10 Essential (primary) hypertension: Secondary | ICD-10-CM | POA: Insufficient documentation

## 2023-11-19 DIAGNOSIS — R197 Diarrhea, unspecified: Secondary | ICD-10-CM | POA: Diagnosis not present

## 2023-11-19 DIAGNOSIS — Z79899 Other long term (current) drug therapy: Secondary | ICD-10-CM | POA: Diagnosis not present

## 2023-11-19 DIAGNOSIS — Z809 Family history of malignant neoplasm, unspecified: Secondary | ICD-10-CM | POA: Insufficient documentation

## 2023-11-19 DIAGNOSIS — G2581 Restless legs syndrome: Secondary | ICD-10-CM | POA: Diagnosis not present

## 2023-11-19 MED ORDER — HYDROCODONE-ACETAMINOPHEN 5-325 MG PO TABS: 1.0000 | ORAL_TABLET | Freq: Four times a day (QID) | ORAL | 0 refills | Status: DC | PRN

## 2023-11-19 NOTE — Telephone Encounter (Signed)
 Called pt & discuss Edu Class for tomorrow.  She had a class in 2021 & we both agreed that she didn't need another class. Discussed alimta which is the only new drug she will receive & reminded to take her folic acid .  She reports taking it last 3 days & she has already had her Vit B 12 & informed that these can be depleted with these drugs so she will cont. Folic Acid  & get Vit B 12 every 3rd treatment.  Appt cancelled.

## 2023-11-20 ENCOUNTER — Inpatient Hospital Stay

## 2023-11-20 ENCOUNTER — Ambulatory Visit (HOSPITAL_BASED_OUTPATIENT_CLINIC_OR_DEPARTMENT_OTHER): Admitting: Obstetrics & Gynecology

## 2023-11-20 ENCOUNTER — Inpatient Hospital Stay: Attending: Internal Medicine

## 2023-11-20 ENCOUNTER — Other Ambulatory Visit: Payer: Self-pay | Admitting: Physician Assistant

## 2023-11-20 ENCOUNTER — Telehealth: Payer: Self-pay

## 2023-11-20 ENCOUNTER — Encounter (HOSPITAL_BASED_OUTPATIENT_CLINIC_OR_DEPARTMENT_OTHER): Payer: Self-pay | Admitting: Obstetrics & Gynecology

## 2023-11-20 VITALS — BP 86/59 | HR 75 | Wt 118.0 lb

## 2023-11-20 DIAGNOSIS — K30 Functional dyspepsia: Secondary | ICD-10-CM | POA: Insufficient documentation

## 2023-11-20 DIAGNOSIS — C3412 Malignant neoplasm of upper lobe, left bronchus or lung: Secondary | ICD-10-CM | POA: Diagnosis present

## 2023-11-20 DIAGNOSIS — Z90710 Acquired absence of both cervix and uterus: Secondary | ICD-10-CM

## 2023-11-20 DIAGNOSIS — Z1211 Encounter for screening for malignant neoplasm of colon: Secondary | ICD-10-CM

## 2023-11-20 DIAGNOSIS — C3492 Malignant neoplasm of unspecified part of left bronchus or lung: Secondary | ICD-10-CM

## 2023-11-20 DIAGNOSIS — E871 Hypo-osmolality and hyponatremia: Secondary | ICD-10-CM | POA: Diagnosis not present

## 2023-11-20 DIAGNOSIS — D6481 Anemia due to antineoplastic chemotherapy: Secondary | ICD-10-CM | POA: Insufficient documentation

## 2023-11-20 DIAGNOSIS — Z5111 Encounter for antineoplastic chemotherapy: Secondary | ICD-10-CM | POA: Insufficient documentation

## 2023-11-20 DIAGNOSIS — Z01419 Encounter for gynecological examination (general) (routine) without abnormal findings: Secondary | ICD-10-CM | POA: Diagnosis not present

## 2023-11-20 DIAGNOSIS — R197 Diarrhea, unspecified: Secondary | ICD-10-CM | POA: Insufficient documentation

## 2023-11-20 DIAGNOSIS — C7951 Secondary malignant neoplasm of bone: Secondary | ICD-10-CM | POA: Diagnosis not present

## 2023-11-20 DIAGNOSIS — C3490 Malignant neoplasm of unspecified part of unspecified bronchus or lung: Secondary | ICD-10-CM

## 2023-11-20 DIAGNOSIS — J90 Pleural effusion, not elsewhere classified: Secondary | ICD-10-CM | POA: Diagnosis not present

## 2023-11-20 DIAGNOSIS — B370 Candidal stomatitis: Secondary | ICD-10-CM | POA: Insufficient documentation

## 2023-11-20 DIAGNOSIS — R300 Dysuria: Secondary | ICD-10-CM | POA: Diagnosis not present

## 2023-11-20 DIAGNOSIS — D6959 Other secondary thrombocytopenia: Secondary | ICD-10-CM | POA: Diagnosis not present

## 2023-11-20 DIAGNOSIS — I959 Hypotension, unspecified: Secondary | ICD-10-CM | POA: Insufficient documentation

## 2023-11-20 DIAGNOSIS — R1112 Projectile vomiting: Secondary | ICD-10-CM | POA: Insufficient documentation

## 2023-11-20 LAB — CMP (CANCER CENTER ONLY)
ALT: 95 U/L — ABNORMAL HIGH (ref 0–44)
AST: 38 U/L (ref 15–41)
Albumin: 4.2 g/dL (ref 3.5–5.0)
Alkaline Phosphatase: 114 U/L (ref 38–126)
Anion gap: 5 (ref 5–15)
BUN: 19 mg/dL (ref 8–23)
CO2: 26 mmol/L (ref 22–32)
Calcium: 9.5 mg/dL (ref 8.9–10.3)
Chloride: 96 mmol/L — ABNORMAL LOW (ref 98–111)
Creatinine: 1.3 mg/dL — ABNORMAL HIGH (ref 0.44–1.00)
GFR, Estimated: 44 mL/min — ABNORMAL LOW (ref >60)
Glucose, Bld: 112 mg/dL — ABNORMAL HIGH (ref 70–99)
Potassium: 5.5 mmol/L — ABNORMAL HIGH (ref 3.5–5.1)
Sodium: 127 mmol/L — ABNORMAL LOW (ref 135–145)
Total Bilirubin: 0.3 mg/dL (ref 0.0–1.2)
Total Protein: 6.4 g/dL — ABNORMAL LOW (ref 6.5–8.1)

## 2023-11-20 LAB — CBC WITH DIFFERENTIAL (CANCER CENTER ONLY)
Abs Immature Granulocytes: 0.06 K/uL (ref 0.00–0.07)
Basophils Absolute: 0 K/uL (ref 0.0–0.1)
Basophils Relative: 0 %
Eosinophils Absolute: 0.1 K/uL (ref 0.0–0.5)
Eosinophils Relative: 1 %
HCT: 36.2 % (ref 36.0–46.0)
Hemoglobin: 12.4 g/dL (ref 12.0–15.0)
Immature Granulocytes: 1 %
Lymphocytes Relative: 9 %
Lymphs Abs: 0.9 K/uL (ref 0.7–4.0)
MCH: 31.4 pg (ref 26.0–34.0)
MCHC: 34.3 g/dL (ref 30.0–36.0)
MCV: 91.6 fL (ref 80.0–100.0)
Monocytes Absolute: 0.8 K/uL (ref 0.1–1.0)
Monocytes Relative: 9 %
Neutro Abs: 7.6 K/uL (ref 1.7–7.7)
Neutrophils Relative %: 80 %
Platelet Count: 194 K/uL (ref 150–400)
RBC: 3.95 MIL/uL (ref 3.87–5.11)
RDW: 14.2 % (ref 11.5–15.5)
WBC Count: 9.5 K/uL (ref 4.0–10.5)
nRBC: 0 % (ref 0.0–0.2)

## 2023-11-20 LAB — POCT URINALYSIS DIP (CLINITEK)
Blood, UA: NEGATIVE
Glucose, UA: NEGATIVE mg/dL
Ketones, POC UA: NEGATIVE mg/dL
Leukocytes, UA: NEGATIVE
Nitrite, UA: NEGATIVE
POC PROTEIN,UA: NEGATIVE
Spec Grav, UA: >=1.03 — AB (ref 1.010–1.025)
Urobilinogen, UA: 0.2 U/dL
pH, UA: 6 (ref 5.0–8.0)

## 2023-11-20 MED FILL — Fosaprepitant Dimeglumine For IV Infusion 150 MG (Base Eq): INTRAVENOUS | Qty: 5 | Status: AC

## 2023-11-20 NOTE — Patient Instructions (Signed)
 [facility] [address] [place] [address] [place],  [place]  [address]   Main: [phone]  Dr.  Shila or Dr. San

## 2023-11-20 NOTE — Progress Notes (Signed)
 Breast and Pelvic Exam Patient name: Linda Woods MRN [id number]  Date of birth: [birth year] Chief Complaint:   Gynecologic Exam (Pt reports no concerns or complaints today. )  History of Present Illness:   Linda Woods is a 73 y.o. [phone] Caucasian female being seen today for breast and pelvic exam.  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  Having back pain.  Was diagnosed with a UTI and was treated by her urologist for this.  She is still feeling a little irritated.  Would like recheck.  Denies hematuria and vaginal bleeding.      Patient's last menstrual period was 08/21/2002.  Last pap: Hysterectomy. H/O abnormal pap: no Last mammogram: 11/13/2023. Results were: normal. Family h/o breast cancer: yes maternal aunt. Last colonoscopy: 2021.  Done with Dr. Luis who is now retired.  F/u 5 years.  Needs referral.  Family h/o colorectal cancer: no Dexa:  t score -1.5 10/2023     11/20/2023    3:10 PM 11/19/2023    1:00 PM 11/01/2022    9:10 AM  Depression screen PHQ 2/9  Decreased Interest 0 0 0  Down, Depressed, Hopeless 0 0 0  PHQ - 2 Score 0 0 0     Review of Systems:   Pertinent items are noted in HPI Positive constipation with pain medication.  Denies pelvic pain.  Urinary issues per above.   Pertinent History Reviewed:  Reviewed past medical,surgical, social and family history.  Reviewed problem list, medications and allergies. Physical Assessment:   Vitals:   11/20/23 1457  BP: (!) 86/59  Pulse: 75  SpO2: 100%  Weight: 118 lb (53.5 kg)  Body mass index is 19.34 kg/m.        Physical Examination:   General appearance - well appearing, and in no distress  Mental status - alert, oriented to person, place, and time  Psych:  She has a normal mood and affect  Skin - warm and dry, normal color, no suspicious lesions noted  Chest - effort normal, all lung fields clear to auscultation bilaterally  Heart - normal rate and regular  rhythm  Neck:  midline trachea, no thyromegaly or nodules  Breasts - breasts appear normal, no suspicious masses, no skin or nipple changes or  axillary nodes  Abdomen - soft, nontender, nondistended, no masses or organomegaly  Pelvic - VULVA: normal appearing vulva with no masses, tenderness or lesions   VAGINA: normal appearing vagina with normal color and discharge, no lesions   CERVIX: surgically absent  Thin prep pap is not indicated  UTERUS: surgically absent  ADNEXA: No adnexal masses or tenderness noted.  Rectal - normal rectal, good sphincter tone, no masses felt  Extremities:  No swelling or varicosities noted  Chaperone present for exam  Results for orders placed or performed in visit on 11/20/23 (from the past 24 hours)  POCT URINALYSIS DIP (CLINITEK)   Collection Time: 11/20/23  3:24 PM  Result Value Ref Range   Color, UA yellow yellow   Clarity, UA clear clear   Glucose, UA negative negative mg/dL   Bilirubin, UA small (A) negative   Ketones, POC UA negative negative mg/dL   Spec Grav, UA >=8.969 (A) 1.010 - 1.025   Blood, UA negative negative   pH, UA 6.0 5.0 - 8.0   POC PROTEIN,UA negative negative, trace   Urobilinogen, UA 0.2 0.2 or 1.0 E.U./dL   Nitrite, UA Negative Negative   Leukocytes, UA Negative  Negative  Results for orders placed or performed in visit on 11/20/23 (from the past 24 hours)  CMP (Cancer Center only)   Collection Time: 11/20/23 10:59 AM  Result Value Ref Range   Sodium 127 (L) 135 - 145 mmol/L   Potassium 5.5 (H) 3.5 - 5.1 mmol/L   Chloride 96 (L) 98 - 111 mmol/L   CO2 26 22 - 32 mmol/L   Glucose, Bld 112 (H) 70 - 99 mg/dL   BUN 19 8 - 23 mg/dL   Creatinine 8.69 (H) 9.55 - 1.00 mg/dL   Calcium  9.5 8.9 - 10.3 mg/dL   Total Protein 6.4 (L) 6.5 - 8.1 g/dL   Albumin 4.2 3.5 - 5.0 g/dL   AST 38 15 - 41 U/L   ALT 95 (H) 0 - 44 U/L   Alkaline Phosphatase 114 38 - 126 U/L   Total Bilirubin 0.3 0.0 - 1.2 mg/dL   GFR, Estimated 44 (L) >60  mL/min   Anion gap 5 5 - 15  CBC with Differential (Cancer Center Only)   Collection Time: 11/20/23 10:59 AM  Result Value Ref Range   WBC Count 9.5 4.0 - 10.5 K/uL   RBC 3.95 3.87 - 5.11 MIL/uL   Hemoglobin 12.4 12.0 - 15.0 g/dL   HCT 63.7 63.9 - 53.9 %   MCV 91.6 80.0 - 100.0 fL   MCH 31.4 26.0 - 34.0 pg   MCHC 34.3 30.0 - 36.0 g/dL   RDW 85.7 88.4 - 84.4 %   Platelet Count 194 150 - 400 K/uL   nRBC 0.0 0.0 - 0.2 %   Neutrophils Relative % 80 %   Neutro Abs 7.6 1.7 - 7.7 K/uL   Lymphocytes Relative 9 %   Lymphs Abs 0.9 0.7 - 4.0 K/uL   Monocytes Relative 9 %   Monocytes Absolute 0.8 0.1 - 1.0 K/uL   Eosinophils Relative 1 %   Eosinophils Absolute 0.1 0.0 - 0.5 K/uL   Basophils Relative 0 %   Basophils Absolute 0.0 0.0 - 0.1 K/uL   Immature Granulocytes 1 %   Abs Immature Granulocytes 0.06 0.00 - 0.07 K/uL    Assessment & Plan:  1. Encntr for gyn exam (general) (routine) w/o abn findings (Primary) - Pap smear not indicated - Mammogram 10/2023 - Colonoscopy 2020.  Was advised follow up 5 years.  She wants referral.   - Bone mineral density 2025 - vaccines reviewed/updated  2. Dysuria - POCT URINALYSIS DIP (CLINITEK) - Urine Culture  3. Colon cancer screening - Ambulatory referral to Gastroenterology.  Due in 2026  4. H/O: hysterectomy  5. Adenocarcinoma of left lung, stage 3 (HCC) - followed by Dr. Sherrod   Orders Placed This Encounter  Procedures   Urine Culture   POCT URINALYSIS DIP (CLINITEK)    Meds: No orders of the defined types were placed in this encounter.   Ronal GORMAN Pinal, MD 11/20/2023 4:14 PM

## 2023-11-20 NOTE — Telephone Encounter (Signed)
 Spoke with Melissa, patients friend, in regards to appointments tomorrow and she confirmed appts.

## 2023-11-21 ENCOUNTER — Inpatient Hospital Stay

## 2023-11-21 ENCOUNTER — Other Ambulatory Visit: Payer: Self-pay | Admitting: Internal Medicine

## 2023-11-21 ENCOUNTER — Telehealth: Payer: Self-pay

## 2023-11-21 ENCOUNTER — Encounter: Payer: Self-pay | Admitting: Internal Medicine

## 2023-11-21 VITALS — BP 108/64 | HR 73 | Temp 97.8°F | Resp 19 | Ht 65.5 in | Wt 120.0 lb

## 2023-11-21 DIAGNOSIS — C3492 Malignant neoplasm of unspecified part of left bronchus or lung: Secondary | ICD-10-CM

## 2023-11-21 DIAGNOSIS — Z5111 Encounter for antineoplastic chemotherapy: Secondary | ICD-10-CM | POA: Diagnosis not present

## 2023-11-21 LAB — CBC WITH DIFFERENTIAL (CANCER CENTER ONLY)
Abs Immature Granulocytes: 0.06 K/uL (ref 0.00–0.07)
Basophils Absolute: 0 K/uL (ref 0.0–0.1)
Basophils Relative: 0 %
Eosinophils Absolute: 0 K/uL (ref 0.0–0.5)
Eosinophils Relative: 0 %
HCT: 32.5 % — ABNORMAL LOW (ref 36.0–46.0)
Hemoglobin: 11.5 g/dL — ABNORMAL LOW (ref 12.0–15.0)
Immature Granulocytes: 1 %
Lymphocytes Relative: 7 %
Lymphs Abs: 0.6 K/uL — ABNORMAL LOW (ref 0.7–4.0)
MCH: 31.8 pg (ref 26.0–34.0)
MCHC: 35.4 g/dL (ref 30.0–36.0)
MCV: 89.8 fL (ref 80.0–100.0)
Monocytes Absolute: 0.2 K/uL (ref 0.1–1.0)
Monocytes Relative: 3 %
Neutro Abs: 7.4 K/uL (ref 1.7–7.7)
Neutrophils Relative %: 89 %
Platelet Count: 189 K/uL (ref 150–400)
RBC: 3.62 MIL/uL — ABNORMAL LOW (ref 3.87–5.11)
RDW: 13.5 % (ref 11.5–15.5)
WBC Count: 8.3 K/uL (ref 4.0–10.5)
nRBC: 0 % (ref 0.0–0.2)

## 2023-11-21 LAB — CMP (CANCER CENTER ONLY)
ALT: 98 U/L — ABNORMAL HIGH (ref 0–44)
AST: 40 U/L (ref 15–41)
Albumin: 4.2 g/dL (ref 3.5–5.0)
Alkaline Phosphatase: 102 U/L (ref 38–126)
Anion gap: 7 (ref 5–15)
BUN: 19 mg/dL (ref 8–23)
CO2: 23 mmol/L (ref 22–32)
Calcium: 9.8 mg/dL (ref 8.9–10.3)
Chloride: 94 mmol/L — ABNORMAL LOW (ref 98–111)
Creatinine: 1.04 mg/dL — ABNORMAL HIGH (ref 0.44–1.00)
GFR, Estimated: 57 mL/min — ABNORMAL LOW (ref >60)
Glucose, Bld: 149 mg/dL — ABNORMAL HIGH (ref 70–99)
Potassium: 5.3 mmol/L — ABNORMAL HIGH (ref 3.5–5.1)
Sodium: 124 mmol/L — ABNORMAL LOW (ref 135–145)
Total Bilirubin: 0.3 mg/dL (ref 0.0–1.2)
Total Protein: 6.6 g/dL (ref 6.5–8.1)

## 2023-11-21 MED ORDER — SODIUM CHLORIDE 1 G PO TABS: 1.0000 g | ORAL_TABLET | Freq: Two times a day (BID) | ORAL | 0 refills | Status: DC

## 2023-11-21 MED ORDER — SODIUM CHLORIDE 0.9 % IV SOLN
341.0000 mg | Freq: Once | INTRAVENOUS | Status: AC
  Administered 2023-11-21: 340 mg via INTRAVENOUS
  Filled 2023-11-21: qty 34

## 2023-11-21 MED ORDER — SODIUM CHLORIDE 0.9 % IV SOLN: INTRAVENOUS | Status: AC

## 2023-11-21 MED ORDER — PALONOSETRON HCL INJECTION 0.25 MG/5ML
0.2500 mg | Freq: Once | INTRAVENOUS | Status: AC
  Administered 2023-11-21: 0.25 mg via INTRAVENOUS
  Filled 2023-11-21: qty 5

## 2023-11-21 MED ORDER — CYANOCOBALAMIN 1000 MCG/ML IJ SOLN: 1000.0000 ug | Freq: Once | INTRAMUSCULAR | Status: DC

## 2023-11-21 MED ORDER — SODIUM CHLORIDE 0.9 % IV SOLN: INTRAVENOUS | Status: DC

## 2023-11-21 MED ORDER — SODIUM CHLORIDE 0.9 % IV SOLN
150.0000 mg | Freq: Once | INTRAVENOUS | Status: AC
  Administered 2023-11-21: 150 mg via INTRAVENOUS
  Filled 2023-11-21: qty 150

## 2023-11-21 MED ORDER — SODIUM CHLORIDE 0.9 % IV SOLN
500.0000 mg/m2 | Freq: Once | INTRAVENOUS | Status: AC
  Administered 2023-11-21: 800 mg via INTRAVENOUS
  Filled 2023-11-21: qty 20

## 2023-11-21 MED ORDER — DEXAMETHASONE SODIUM PHOSPHATE 10 MG/ML IJ SOLN
10.0000 mg | Freq: Once | INTRAMUSCULAR | Status: AC
  Administered 2023-11-21: 10 mg via INTRAVENOUS
  Filled 2023-11-21: qty 1

## 2023-11-21 NOTE — Telephone Encounter (Signed)
 S/w patient about this morning's appointments. Patient reports that she is stuck out front in traffic. She will be getting checked in soon. Charge RN made aware of the delays.

## 2023-11-21 NOTE — Patient Instructions (Signed)
 [facility] - A DEPT OF [facility]   Discharge Instructions: Thank you for choosing [facility] to provide your oncology and hematology care.   If you have a lab appointment with the Cancer Center, please go directly to the Cancer Center and check in at the registration area.   Wear comfortable clothing and clothing appropriate for easy access to any Portacath or PICC line.   We strive to give you quality time with your provider. You may need to reschedule your appointment if you arrive late (15 or more minutes).  Arriving late affects you and other patients whose appointments are after yours.  Also, if you miss three or more appointments without notifying the office, you may be dismissed from the clinic at the provider's discretion.      For prescription refill requests, have your pharmacy contact our office and allow 72 hours for refills to be completed.    Today you received the following chemotherapy and/or immunotherapy agents: Pemetrexed (Alimta) and Carboplatin        To help prevent nausea and vomiting after your treatment, we encourage you to take your nausea medication as directed.  BELOW ARE SYMPTOMS THAT SHOULD BE REPORTED IMMEDIATELY: *FEVER GREATER THAN 100.4 F (38 C) OR HIGHER *CHILLS OR SWEATING *NAUSEA AND VOMITING THAT IS NOT CONTROLLED WITH YOUR NAUSEA MEDICATION *UNUSUAL SHORTNESS OF BREATH *UNUSUAL BRUISING OR BLEEDING *URINARY PROBLEMS (pain or burning when urinating, or frequent urination) *BOWEL PROBLEMS (unusual diarrhea, constipation, pain near the anus) TENDERNESS IN MOUTH AND THROAT WITH OR WITHOUT PRESENCE OF ULCERS (sore throat, sores in mouth, or a toothache) UNUSUAL RASH, SWELLING OR PAIN  UNUSUAL VAGINAL DISCHARGE OR ITCHING   Items with * indicate a potential emergency and should be followed up as soon as possible or go to the Emergency Department if any problems should occur.  Please show the CHEMOTHERAPY  ALERT CARD or IMMUNOTHERAPY ALERT CARD at check-in to the Emergency Department and triage nurse.  Should you have questions after your visit or need to cancel or reschedule your appointment, please contact [facility] - A DEPT OF [facility]  Dept: [phone]  and follow the prompts.  Office hours are 8:00 a.m. to 4:30 p.m. Monday - Friday. Please note that voicemails left after 4:00 p.m. may not be returned until the following business day.  We are closed weekends and major holidays. You have access to a nurse at all times for urgent questions. Please call the main number to the clinic Dept: [phone] and follow the prompts.   For any non-urgent questions, you may also contact your provider using MyChart. We now offer e-Visits for anyone 44 and older to request care online for non-urgent symptoms. For details visit [email or web address].   Also download the MyChart app! Go to the app store, search MyChart, open the app, select [facility], and log in with your MyChart username and password.

## 2023-11-21 NOTE — Progress Notes (Signed)
 Proceed w/ current doses of Carbo/Pemetrexed per Dr Sherrod (CrCl = 41 mL/min)  Wilma Dollar, Pharm.D., CPP 11/21/2023@9 :43 AM

## 2023-11-21 NOTE — Progress Notes (Signed)
 Per Cassie Heilingoetter, PA-C and Dr. Sherrod - okay to proceed with treatment with potassium of 5.3, ALT of 98, and sodium of 124.  Patient to receive 1 L fluid with treatment, will be on a 1.5 L fluid restriction, and salt tablet has been sent to her pharmacy. Patient made aware and pharmacy advised.

## 2023-11-21 NOTE — Telephone Encounter (Signed)
 Tried to reach patient in regards to clarify dexamethasone  dose.  Left patient a message stating that per Dr. Sherrod, patient needs to take 4 mg p.o. twice daily the day before, the day of and the day after the chemotherapy.  Asked for a return call with any questions.

## 2023-11-22 LAB — URINE CULTURE

## 2023-11-22 NOTE — Progress Notes (Signed)
 Johann Sieving, MD  [facility] Rosella L PROCEDURE / BIOPSY REVIEW Date: 11/22/23  Requested Biopsy site: L paraaortic LAN Reason for request: r/o met Imaging review: Best seen on PET CT  Decision: Approved Imaging modality to perform: CT Schedule with: Moderate Sedation Schedule for: Any VIR  Additional comments:   Please contact me with questions, concerns, or if issue pertaining to this request arise.  Dayne Sieving Johann, MD [facility]       Previous Messages    ----- Message ----- From: Sherrod Sherrod, MD Sent: 11/22/2023  11:00 AM EDT To: Sieving Johann, MD Subject: RE: CT BIOPSY                                  Yes. I agree. It is better. Thank you ----- Message ----- From: Johann Sieving, MD Sent: 11/22/2023   9:44 AM EDT To: Sherrod Sherrod, MD Subject: FW: CT BIOPSY                                  That L paraaortic node is approachable and typically allows better path assessment than bone mets. Would you prefer biopsy of it instead?  THanks Sieving ----- Message ----- From: Baldwin Rosella CROME Sent: 11/21/2023   9:48 AM EDT To: Rosella CROME Baldwin; Taryn F Rigney, RT; Ir Proc* Subject: CT BIOPSY                                      Procedure :CT BIOPSY  Reason :CT guided Bone biopsy of any lytic bone metastases at the L5 vertebral body or hypermetabolic lesions are seen in the bony pelvis, sacrum and thoracolumbar spine. Dx: Adenocarcinoma of left lung, stage 3 (HCC) [C34.92 (ICD-10-CM)]    History :M PET Image Restage (PS) Skull Base to Thigh,DG BONE DENSITY (DXA) ,CT CHEST ABDOMEN PELVIS W CONTRAST,IR KYPHO LUMBAR INC FX REDUCE BONE BX UNI/BIL CANNULATION INC/IMAGING,CT Chest W Contrast,NM Hepatobiliary Liver Func ,US  Abdomen Limited RUQ ,CT ABDOMEN PELVIS W CONTRAST   Provider:Mohamed, Sherrod, MD  Provider contact ; [phone]

## 2023-11-23 ENCOUNTER — Ambulatory Visit (HOSPITAL_BASED_OUTPATIENT_CLINIC_OR_DEPARTMENT_OTHER): Payer: Self-pay | Admitting: Obstetrics & Gynecology

## 2023-11-25 ENCOUNTER — Ambulatory Visit
Admission: RE | Admit: 2023-11-25 | Discharge: 2023-11-25 | Disposition: A | Discharge status: Home / Self Care | Source: Ambulatory Visit | Attending: Radiation Oncology | Admitting: Radiation Oncology

## 2023-11-25 ENCOUNTER — Inpatient Hospital Stay: Admitting: Physician Assistant

## 2023-11-25 VITALS — BP 123/75 | HR 61 | Temp 97.9°F | Resp 16 | Wt 118.5 lb

## 2023-11-25 DIAGNOSIS — Z510 Encounter for antineoplastic radiation therapy: Secondary | ICD-10-CM | POA: Diagnosis present

## 2023-11-25 DIAGNOSIS — C7951 Secondary malignant neoplasm of bone: Secondary | ICD-10-CM | POA: Insufficient documentation

## 2023-11-25 DIAGNOSIS — B370 Candidal stomatitis: Secondary | ICD-10-CM

## 2023-11-25 DIAGNOSIS — C3412 Malignant neoplasm of upper lobe, left bronchus or lung: Secondary | ICD-10-CM | POA: Diagnosis present

## 2023-11-25 DIAGNOSIS — Z5111 Encounter for antineoplastic chemotherapy: Secondary | ICD-10-CM | POA: Diagnosis not present

## 2023-11-25 DIAGNOSIS — C3492 Malignant neoplasm of unspecified part of left bronchus or lung: Secondary | ICD-10-CM | POA: Diagnosis not present

## 2023-11-25 DIAGNOSIS — B3781 Candidal esophagitis: Secondary | ICD-10-CM | POA: Diagnosis not present

## 2023-11-25 MED ORDER — NYSTATIN 100000 UNIT/ML MT SUSP: 5.0000 mL | Freq: Four times a day (QID) | OROMUCOSAL | 0 refills | Status: AC

## 2023-11-25 NOTE — Patient Instructions (Addendum)
 You can swish and swallow Nystatin.  Can discontinue Nystatin after 7 days if symptoms completely resolve.  Prescription sent in for 10 day course incase that is needed.

## 2023-11-25 NOTE — Progress Notes (Signed)
 Symptom Management Consult Note [facility]    Patient Care Team: [name], MD as PCP - General (Family Medicine) Dann Candyce RAMAN, MD as PCP - Cardiology (Cardiology) Evertt Lonell BROCKS, RN as Oncology Nurse Navigator    Name / MRN / DOB: Linda Woods  [id number]  [birth year]   Date of visit: 11/25/2023   Chief Complaint/Reason for visit: thrush   Current Therapy: Systemic chemotherapy with carboplatin  for AUC of 5, Alimta 500 mg/M2 and Tagrisso  80 mg p.o. daily.   Last treatment:  Day 1   Cycle 1 on 11/21/23    ASSESSMENT AND PLAN Patient is a 73 y.o. female with oncologic history of stage IIIc (T3, N3, M0) non-small cell lung cancer, adenocarcinoma followed by Dr. Sherrod.  I have viewed most recent oncology note and lab work.  #Stage IIIc (T3, N3, M0) non-small cell lung cancer, adenocarcinoma  - Next appointment with oncologist is 11/28/23   #Thrush - Acute oral and oropharyngeal candidiasis likely exacerbated by steroid use. Prefers nystatin mouthwash due to throat discomfort. - Prescribed nystatin mouthwash to be sent to [facility]. - Advised to contact if nystatin is intolerable for fluconazole consideration.   Strict ED precautions discussed should symptoms worsen.   HEME/ONC HISTORY Oncology History  Adenocarcinoma of left lung, stage 3 (HCC)  11/03/2019 Initial Diagnosis   Adenocarcinoma of left lung, stage 3 (HCC)   11/16/2019 - 01/04/2020 Chemotherapy   The patient had palonosetron  (ALOXI ) injection 0.25 mg, 0.25 mg, Intravenous,  Once, 8 of 8 cycles Administration: 0.25 mg (11/16/2019), 0.25 mg (12/14/2019), 0.25 mg (12/21/2019), 0.25 mg (11/23/2019), 0.25 mg (12/29/2019), 0.25 mg (12/01/2019), 0.25 mg (12/07/2019), 0.25 mg (01/04/2020) CARBOplatin  (PARAPLATIN ) 190 mg in sodium chloride  0.9 % 250 mL chemo infusion, 190 mg (100 % of original dose 186.6 mg), Intravenous,  Once, 8 of 8 cycles Dose modification: 186.6 mg (original  dose 186.6 mg, Cycle 1) Administration: 190 mg (11/16/2019), 190 mg (12/14/2019), 190 mg (12/21/2019), 190 mg (11/23/2019), 190 mg (12/29/2019), 190 mg (12/01/2019), 190 mg (12/07/2019), 190 mg (01/04/2020) PACLitaxel  (TAXOL ) 84 mg in sodium chloride  0.9 % 250 mL chemo infusion (</= 80mg /m2), 45 mg/m2 = 84 mg, Intravenous,  Once, 8 of 8 cycles Administration: 84 mg (11/16/2019), 84 mg (12/14/2019), 84 mg (12/21/2019), 84 mg (11/23/2019), 84 mg (12/29/2019), 84 mg (12/01/2019), 84 mg (12/07/2019), 84 mg (01/04/2020)  for chemotherapy treatment.    02/02/2020 - 02/02/2020 Chemotherapy   Patient is on Treatment Plan : LUNG NSCLC Osimertinib  q28d     04/13/2020 Cancer Staging   Staging form: Lung, AJCC 8th Edition - Clinical: Stage IIIC (cT3, cN3, cM0) - Signed by Sherrod Sherrod, MD on 04/13/2020   11/21/2023 -  Chemotherapy   Patient is on Treatment Plan : LUNG NSCLC Osimertinib  + Pemetrexed + Carboplatin  q21d x 4 cycles / Osimertinib  + Pemetrexed q21d         INTERVAL HISTORY  Discussed the use of AI scribe software for clinical note transcription with the patient, who gave verbal consent to proceed.    Linda Woods is a 73 y.o. female with oncologic history as above presenting to [facility] today with chief complaint of thrush. Patient is accompanied by family member who provides additional history.  She began experiencing a burning sensation in her mouth and throat early this morning. The discomfort extends down her throat, making swallowing difficult. She describes the sensation as similar to a previous episode of thrush she experienced four years ago during  chemotherapy treatment, which resolved quickly with mouthwash treatment. No fever, chills, nausea, vomiting, or bleeding from the mouth. She is able to tolerate soft foods by mouth currently and taking in adequate fluids. She reports she tolerated treatment well     ROS  All other systems are reviewed and are negative for acute change  except as noted in the HPI.    Allergies  Allergen Reactions   2,4-D Dimethylamine Other (See Comments)    Product containing 3-hydroxy-3-methylglutaryl-coenzyme A reductase inhibitor (product)     Past Medical History:  Diagnosis Date   Anemia    as a child   Anxiety    Asthma 10/19/2020   Bursitis    Chronic reflux esophagitis    Complication of anesthesia    Diarrhea, functional    Diverticulosis    GERD (gastroesophageal reflux disease)    History of kidney stones    Hypertension    Knee pain    right knee-seeing ortho   lung ca 09/2019   Osteoarthritis    Plantar fasciitis    Pneumonia    PONV (postoperative nausea and vomiting)    has used the patch before and that helps   Pure hypercholesterolemia    RLS (restless legs syndrome)    Superficial vein thrombosis    Thyroid  disease    hypothyroid     Past Surgical History:  Procedure Laterality Date   APPENDECTOMY     BRONCHIAL BIOPSY  10/20/2019   Procedure: BRONCHIAL BIOPSIES;  Surgeon: Shelah Lamar RAMAN, MD;  Location: [facility] ENDOSCOPY;  Service: Pulmonary;;   BRONCHIAL BRUSHINGS  10/20/2019   Procedure: BRONCHIAL BRUSHINGS;  Surgeon: Shelah Lamar RAMAN, MD;  Location[facility] ENDOSCOPY;  Service: Pulmonary;;   BRONCHIAL NEEDLE ASPIRATION BIOPSY  10/20/2019   Procedure: BRONCHIAL NEEDLE ASPIRATION BIOPSIES;  Surgeon: Shelah Lamar RAMAN, MD;  Location: [facility] ENDOSCOPY;  Service: Pulmonary;;   BRONCHIAL WASHINGS  10/20/2019   Procedure: BRONCHIAL WASHINGS;  Surgeon: Shelah Lamar RAMAN, MD;  Location: [facility] ENDOSCOPY;  Service: Pulmonary;;   CARPAL TUNNEL RELEASE Right 2011   Dr. Camella   CHOLECYSTECTOMY N/A    COLPORRHAPHY     posterior   HAND SURGERY Right 2016   Nerve surgery, Dr. Camella   IR KYPHO LUMBAR INC FX REDUCE BONE BX UNI/BIL CANNULATION INC/IMAGING  07/03/2023   OVARIAN CYST REMOVAL     RECTOCELE REPAIR  2011   w/TVH and sling   TONSILLECTOMY     TONSILLECTOMY     TOTAL KNEE ARTHROPLASTY Left 09/09/2018    Procedure: TOTAL KNEE ARTHROPLASTY, CORTISONE INJECTION RIGHT KNEE;  Surgeon: Ernie Cough, MD;  Location: [facility] ORS;  Service: Orthopedics;  Laterality: Left;  70 mins   TOTAL KNEE ARTHROPLASTY Right 04/16/2023   Procedure: TOTAL KNEE ARTHROPLASTY;  Surgeon: Ernie Cough, MD;  Location: [facility] ORS;  Service: Orthopedics;  Laterality: Right;   TOTAL VAGINAL HYSTERECTOMY  10/18/2009   rectocele repair, sling   TUBAL LIGATION Bilateral    VARICOSE VEIN SURGERY     VIDEO BRONCHOSCOPY WITH ENDOBRONCHIAL NAVIGATION N/A 10/20/2019   Procedure: VIDEO BRONCHOSCOPY WITH ENDOBRONCHIAL NAVIGATION;  Surgeon: Shelah Lamar RAMAN, MD;  Location: [facility] ENDOSCOPY;  Service: Pulmonary;  Laterality: N/A;    Social History   Socioeconomic History   Marital status: Married    Spouse name: Not on file   Number of children: 3   Years of education: Not on file   Highest education level: Not on file  Occupational History    Employer: SELF EMPLOYED  Tobacco Use   Smoking status: Never   Smokeless tobacco: Never  Vaping Use   Vaping status: Never Used  Substance and Sexual Activity   Alcohol use: Not Currently    Alcohol/week: 1.0 standard drink of alcohol    Types: 1 Glasses of wine per week   Drug use: No   Sexual activity: Not Currently    Partners: Male    Birth control/protection: Surgical    Comment: Hysterectomy, BTL  Other Topics Concern   Not on file  Social History Narrative   Married, retired Armed forces operational officer   Caffeine- coffee, 1 cup, occass tea   Education- BS   Children- 3   Social Drivers of Corporate investment banker Strain: Not on file  Food Insecurity: No Food Insecurity (11/19/2023)   Hunger Vital Sign    Worried About Running Out of Food in the Last Year: Never true    Ran Out of Food in the Last Year: Never true  Transportation Needs: No Transportation Needs (11/19/2023)   PRAPARE - Administrator, Civil Service (Medical): No    Lack of Transportation (Non-Medical): No   Physical Activity: Not on file  Stress: Not on file  Social Connections: Unknown (05/26/2023)   Social Connection and Isolation Panel    Frequency of Communication with Friends and Family: Patient unable to answer    Frequency of Social Gatherings with Friends and Family: Patient unable to answer    Attends Religious Services: Patient unable to answer    Active Member of Clubs or Organizations: Patient unable to answer    Attends Banker Meetings: Patient unable to answer    Marital Status: Married  Catering manager Violence: Unknown (11/19/2023)   Humiliation, Afraid, Rape, and Kick questionnaire    Fear of Current or Ex-Partner: No    Emotionally Abused: No    Physically Abused: No    Sexually Abused: Not on file    Family History  Problem Relation Age of Onset   Cervical cancer Mother    Heart failure Mother    Heart failure Father    Diabetes Father    Cancer Brother    Breast cancer Maternal Aunt    Breast cancer Paternal Aunt      Current Outpatient Medications:    nystatin (MYCOSTATIN) 100000 UNIT/ML suspension, Take 5 mLs (500,000 Units total) by mouth 4 (four) times daily for 10 days., Disp: 200 mL, Rfl: 0   albuterol  (VENTOLIN  HFA) 108 (90 Base) MCG/ACT inhaler, Inhale 2 puffs into the lungs every 6 (six) hours as needed for wheezing or shortness of breath., Disp: 8.5 g, Rfl: 0   alum & mag hydroxide-simeth (MAALOX/MYLANTA) 200-200-20 MG/5ML suspension, Take 15 mLs by mouth every 6 (six) hours as needed for indigestion or heartburn., Disp: 355 mL, Rfl: 0   chlorhexidine  (HIBICLENS ) 4 % external liquid, Apply 15 mLs (1 Application total) topically as directed for 30 doses. Use as directed daily for 5 days every other week for 6 weeks., Disp: 946 mL, Rfl: 1   Cholecalciferol (VITAMIN D ) 50 MCG (2000 UT) tablet, Take 2,000 Units by mouth daily., Disp: , Rfl:    dexamethasone  (DECADRON ) 4 MG tablet, Take 1 tab 2 times daily starting day before pemetrexed. Then  take 2 tabs daily x 3 days starting day after carboplatin . Take with food., Disp: 30 tablet, Rfl: 1   docusate sodium  (COLACE) 100 MG capsule, Take 1 capsule (100 mg total) by mouth 2 (two) times daily.,  Disp: 60 capsule, Rfl: 2   famotidine  (PEPCID ) 40 MG tablet, Take 40 mg by mouth at bedtime., Disp: , Rfl:    ferrous sulfate  325 (65 FE) MG tablet, Take 325 mg by mouth daily with breakfast. (Patient not taking: Reported on 11/19/2023), Disp: , Rfl:    fexofenadine (ALLEGRA) 180 MG tablet, Take 180 mg by mouth daily as needed for allergies., Disp: , Rfl:    fluticasone  (FLONASE ) 50 MCG/ACT nasal spray, Place 1 spray into both nostrils as needed for allergies., Disp: , Rfl:    folic acid  (FOLVITE ) 1 MG tablet, Take 1 tablet (1 mg total) by mouth daily. Start 7 days before pemetrexed chemotherapy. Continue until 21 days after pemetrexed completed. (Patient not taking: Reported on 11/19/2023), Disp: 100 tablet, Rfl: 3   HYDROcodone -acetaminophen  (NORCO/VICODIN) 5-325 MG tablet, Take 1 tablet by mouth every 6 (six) hours as needed for moderate pain (pain score 4-6)., Disp: 30 tablet, Rfl: 0   levothyroxine  (SYNTHROID ) 100 MCG tablet, Take 1 tablet (100 mcg total) by mouth daily. One po qd, Disp: 90 tablet, Rfl: 0   lidocaine  (LIDODERM ) 5 %, Place 1 patch onto the skin daily., Disp: , Rfl:    lidocaine -prilocaine  (EMLA ) cream, Apply to affected area once (Patient not taking: Reported on 11/19/2023), Disp: 30 g, Rfl: 3   LORazepam  (ATIVAN ) 0.5 MG tablet, Take 0.5 mg by mouth at bedtime.  (Patient not taking: Reported on 11/19/2023), Disp: , Rfl:    MAGNESIUM  PO, Take 1 tablet by mouth daily., Disp: , Rfl:    melatonin 5 MG TABS, Take 5 mg by mouth at bedtime as needed (sleep)., Disp: , Rfl:    metoprolol  succinate (TOPROL -XL) 50 MG 24 hr tablet, Take 1 tablet (50 mg total) by mouth daily., Disp: 90 tablet, Rfl: 3   MYRBETRIQ  50 MG TB24 tablet, Take 50 mg by mouth daily., Disp: , Rfl:    ondansetron  (ZOFRAN ) 4  MG tablet, Take 1 tablet (4 mg total) by mouth every 6 (six) hours as needed for nausea. (Patient not taking: Reported on 11/19/2023), Disp: 20 tablet, Rfl: 0   osimertinib  mesylate (TAGRISSO ) 80 MG tablet, Take 1 tablet (80 mg total) by mouth daily., Disp: 30 tablet, Rfl: 3   oxyCODONE  (OXY IR/ROXICODONE ) 5 MG immediate release tablet, Take 5 mg by mouth every 6 (six) hours as needed for severe pain (pain score 7-10). (Patient not taking: Reported on 11/19/2023), Disp: , Rfl:    POTASSIUM PO, Take 1 tablet by mouth daily., Disp: , Rfl:    PREMARIN vaginal cream, Place 1 applicator vaginally as needed (urinary issues). (Patient not taking: Reported on 11/19/2023), Disp: , Rfl:    prochlorperazine  (COMPAZINE ) 10 MG tablet, Take 1 tablet (10 mg total) by mouth every 6 (six) hours as needed for nausea or vomiting., Disp: 30 tablet, Rfl: 1   RABEprazole  (ACIPHEX ) 20 MG tablet, Take 1 tablet (20 mg total) by mouth 2 (two) times a week., Disp: , Rfl:    rosuvastatin  (CRESTOR ) 5 MG tablet, Take 1 tablet (5 mg total) by mouth daily., Disp: 90 tablet, Rfl: 3   sodium chloride  1 g tablet, Take 1 tablet (1 g total) by mouth 2 (two) times daily with a meal., Disp: 60 tablet, Rfl: 0   STIOLTO RESPIMAT  2.5-2.5 MCG/ACT AERS, INHALE TWO PUFFS INTO THE LUNGS DAILY, Disp: 4 g, Rfl: 5   triamcinolone  cream (KENALOG ) 0.1 %, Apply 1 Application topically 2 (two) times daily. As needed for itching/rash, Disp: 453.6 g, Rfl:  0   vitamin B-12 (CYANOCOBALAMIN ) 1000 MCG tablet, Take 1,000 mcg by mouth daily., Disp: , Rfl:   PHYSICAL EXAM ECOG FS:1 - Symptomatic but completely ambulatory    Vitals:   11/25/23 1437  BP: 123/75  Pulse: 61  Resp: 16  Temp: 97.9 F (36.6 C)  TempSrc: Oral  SpO2: 100%  Weight: 118 lb 8 oz (53.8 kg)   Physical Exam Vitals and nursing note reviewed.  Constitutional:      Appearance: She is not ill-appearing or toxic-appearing.  HENT:     Head: Normocephalic.     Comments: Thin white  coating on tongue and inside of lower lip Eyes:     Conjunctiva/sclera: Conjunctivae normal.  Cardiovascular:     Rate and Rhythm: Normal rate.  Pulmonary:     Effort: Pulmonary effort is normal.  Abdominal:     General: There is no distension.  Musculoskeletal:     Cervical back: Normal range of motion.  Skin:    General: Skin is warm and dry.  Neurological:     Mental Status: She is alert.        LABORATORY DATA I have reviewed the data as listed    Latest Ref Rng & Units 11/21/2023    8:08 AM 11/20/2023   10:59 AM 11/12/2023    1:36 PM  CBC  WBC 4.0 - 10.5 K/uL 8.3  9.5  4.4   Hemoglobin 12.0 - 15.0 g/dL 88.4  87.5  87.3   Hematocrit 36.0 - 46.0 % 32.5  36.2  36.7   Platelets 150 - 400 K/uL 189  194  266         Latest Ref Rng & Units 11/21/2023    8:08 AM 11/20/2023   10:59 AM 11/12/2023    1:36 PM  CMP  Glucose 70 - 99 mg/dL 850  887  868   BUN 8 - 23 mg/dL 19  19  14    Creatinine 0.44 - 1.00 mg/dL 8.95  8.69  9.19   Sodium 135 - 145 mmol/L 124  127  136   Potassium 3.5 - 5.1 mmol/L 5.3  5.5  3.5   Chloride 98 - 111 mmol/L 94  96  100   CO2 22 - 32 mmol/L 23  26  29    Calcium  8.9 - 10.3 mg/dL 9.8  9.5  9.6   Total Protein 6.5 - 8.1 g/dL 6.6  6.4  6.5   Total Bilirubin 0.0 - 1.2 mg/dL 0.3  0.3  0.3   Alkaline Phos 38 - 126 U/L 102  114  170   AST 15 - 41 U/L 40  38  15   ALT 0 - 44 U/L 98  95  16        RADIOGRAPHIC STUDIES (from last 24 hours if applicable) I have personally reviewed the radiological images as listed and agreed with the findings in the report. No results found.      Visit Diagnosis: 1. Adenocarcinoma of left lung, stage 3 (HCC)   2. Thrush of mouth and esophagus (HCC)      No orders of the defined types were placed in this encounter.   All questions were answered. The patient knows to call the clinic with any problems, questions or concerns. No barriers to learning was detected.  A total of more than 30 minutes were spent on  this encounter with face-to-face time and non-face-to-face time, including preparing to see the patient, ordering tests and/or medications, counseling the  patient and coordination of care as outlined above.    Thank you for allowing me to participate in the care of this patient.    Xiao Graul E  Walisiewicz, PA-C Department of Hematology/Oncology [facility] at [facility] Phone: [phone]  [phone]    11/25/2023 3:13 PM

## 2023-11-26 ENCOUNTER — Encounter: Payer: Self-pay | Admitting: Internal Medicine

## 2023-11-27 ENCOUNTER — Ambulatory Visit

## 2023-11-27 ENCOUNTER — Other Ambulatory Visit: Payer: Self-pay | Admitting: Internal Medicine

## 2023-11-27 ENCOUNTER — Ambulatory Visit: Admitting: Radiation Oncology

## 2023-11-27 DIAGNOSIS — C3492 Malignant neoplasm of unspecified part of left bronchus or lung: Secondary | ICD-10-CM

## 2023-11-28 ENCOUNTER — Inpatient Hospital Stay (HOSPITAL_BASED_OUTPATIENT_CLINIC_OR_DEPARTMENT_OTHER): Admitting: Internal Medicine

## 2023-11-28 ENCOUNTER — Ambulatory Visit
Admission: RE | Admit: 2023-11-28 | Discharge: 2023-11-28 | Disposition: A | Discharge status: Home / Self Care | Payer: Self-pay | Source: Ambulatory Visit | Attending: Radiation Oncology | Admitting: Radiation Oncology

## 2023-11-28 ENCOUNTER — Other Ambulatory Visit: Payer: Self-pay | Admitting: Radiation Oncology

## 2023-11-28 ENCOUNTER — Inpatient Hospital Stay

## 2023-11-28 VITALS — BP 114/62 | HR 58 | Temp 97.3°F | Resp 17 | Ht 65.5 in | Wt 116.3 lb

## 2023-11-28 DIAGNOSIS — C3492 Malignant neoplasm of unspecified part of left bronchus or lung: Secondary | ICD-10-CM

## 2023-11-28 DIAGNOSIS — Z5111 Encounter for antineoplastic chemotherapy: Secondary | ICD-10-CM | POA: Diagnosis not present

## 2023-11-28 LAB — CBC WITH DIFFERENTIAL (CANCER CENTER ONLY)
Abs Immature Granulocytes: 0.02 K/uL (ref 0.00–0.07)
Basophils Absolute: 0 K/uL (ref 0.0–0.1)
Basophils Relative: 0 %
Eosinophils Absolute: 0 K/uL (ref 0.0–0.5)
Eosinophils Relative: 1 %
HCT: 34.3 % — ABNORMAL LOW (ref 36.0–46.0)
Hemoglobin: 11.8 g/dL — ABNORMAL LOW (ref 12.0–15.0)
Immature Granulocytes: 1 %
Lymphocytes Relative: 26 %
Lymphs Abs: 0.9 K/uL (ref 0.7–4.0)
MCH: 31.6 pg (ref 26.0–34.0)
MCHC: 34.4 g/dL (ref 30.0–36.0)
MCV: 91.7 fL (ref 80.0–100.0)
Monocytes Absolute: 0.1 K/uL (ref 0.1–1.0)
Monocytes Relative: 2 %
Neutro Abs: 2.3 K/uL (ref 1.7–7.7)
Neutrophils Relative %: 70 %
Platelet Count: 95 K/uL — ABNORMAL LOW (ref 150–400)
RBC: 3.74 MIL/uL — ABNORMAL LOW (ref 3.87–5.11)
RDW: 13.8 % (ref 11.5–15.5)
WBC Count: 3.3 K/uL — ABNORMAL LOW (ref 4.0–10.5)
nRBC: 0 % (ref 0.0–0.2)

## 2023-11-28 LAB — CMP (CANCER CENTER ONLY)
ALT: 83 U/L — ABNORMAL HIGH (ref 0–44)
AST: 26 U/L (ref 15–41)
Albumin: 3.8 g/dL (ref 3.5–5.0)
Alkaline Phosphatase: 77 U/L (ref 38–126)
Anion gap: 5 (ref 5–15)
BUN: 33 mg/dL — ABNORMAL HIGH (ref 8–23)
CO2: 25 mmol/L (ref 22–32)
Calcium: 9 mg/dL (ref 8.9–10.3)
Chloride: 101 mmol/L (ref 98–111)
Creatinine: 1.02 mg/dL — ABNORMAL HIGH (ref 0.44–1.00)
GFR, Estimated: 58 mL/min — ABNORMAL LOW (ref >60)
Glucose, Bld: 100 mg/dL — ABNORMAL HIGH (ref 70–99)
Potassium: 4.9 mmol/L (ref 3.5–5.1)
Sodium: 131 mmol/L — ABNORMAL LOW (ref 135–145)
Total Bilirubin: 0.4 mg/dL (ref 0.0–1.2)
Total Protein: 6.1 g/dL — ABNORMAL LOW (ref 6.5–8.1)

## 2023-11-28 MED ORDER — LIDOCAINE VISCOUS HCL 2 % MT SOLN
15.0000 mL | Freq: Four times a day (QID) | OROMUCOSAL | 1 refills | Status: DC | PRN
Start: 2023-11-28 — End: 2023-12-27

## 2023-11-28 NOTE — Progress Notes (Signed)
 [facility] [phone]   [phone]  OFFICE PROGRESS NOTE  [name], MD [address] [place] [address]  DIAGNOSIS: Stage IIIc (T3, N3, M0) non-small cell lung cancer, adenocarcinoma presented with large left upper lobe lung mass in addition to left infrahilar and right hilar and subcarinal lymphadenopathy diagnosed in August 2021.   Molecular Biomarkers: Insufficient tissue for foundation 1. Guardant 360 results.  DETECTED ALTERATION(S) / BIOMARKER(S) % CFDNA OR AMPLIFICATION ASSOCIATED FDA-APPROVED THERAPIES CLINICAL TRIAL AVAILABILITY  EGFR L858R, 1.2%, Afatinib, Dacomitinib, Erlotinib, Gefitinib, Osimertinib , Ramucirumab Yes [id number], 1.0%, Afatinib, Dacomitinib, Erlotinib, Gefitinib, Osimertinib , Ramucirumab Yes [id number] 1.0% None  No   PRIOR THERAPY:  1) Weekly concurrent chemoradiation with carboplatin  for an AUC of 2 and paclitaxel  45 mg/m2.  First dose expected on 11/16/2019. Status post 7 cycles.  2) Tagrisso  80 mg p.o. daily. First dose started 01/31/2020.  Status post 42 months of treatment.  She has been off treatment for 4 months based on her request and secondary to adverse effects but this was discontinued secondary to disease progression.  CURRENT THERAPY: Systemic chemotherapy with carboplatin  for AUC of 5, Alimta 500 mg/M2 and Tagrisso  80 mg p.o. daily.  First dose 11/21/2023.  Status post 1 cycle.  INTERVAL HISTORY: Linda Woods 73 y.o. Woods returns to clinic today for follow-up visit accompanied by her friend [place]. Discussed the use of AI scribe software for clinical note transcription with the patient, who gave verbal consent to proceed.  History of Present Illness Linda Woods is a 73 year old Woods with metastatic adenocarcinoma of the lung who presents for evaluation and management of treatment side effects.  She has a history of metastatic adenocarcinoma of the lung, initially  diagnosed as stage 3C in August 2021. She underwent concurrent chemoradiation followed by targeted therapy with Tagrisso  80 mg daily since December 2021. After 42 months of treatment, she experienced disease progression while off Tagrisso , which she had stopped based on her request. She recently resumed Tagrisso  and started systemic chemotherapy with carboplatin  and Alimta, with the first dose administered on November 21, 2023.  She experiences oral thrush, described as white spots in her mouth, which has improved, but her lips remain raw. No major issues in the first week after treatment aside from the thrush. She is currently taking salt tablets twice a day to manage low sodium levels.  She has mild anemia and low platelets. No nausea, vomiting, diarrhea, or headaches. She mentions that the chemotherapy has made her feel better overall.    MEDICAL HISTORY: Past Medical History:  Diagnosis Date   Anemia    as a child   Anxiety    Asthma 10/19/2020   Bursitis    Chronic reflux esophagitis    Complication of anesthesia    Diarrhea, functional    Diverticulosis    GERD (gastroesophageal reflux disease)    History of kidney stones    Hypertension    Knee pain    right knee-seeing ortho   lung ca 09/2019   Osteoarthritis    Plantar fasciitis    Pneumonia    PONV (postoperative nausea and vomiting)    has used the patch before and that helps   Pure hypercholesterolemia    RLS (restless legs syndrome)    Superficial vein thrombosis    Thyroid  disease    hypothyroid    ALLERGIES:  is allergic to 2,4-d dimethylamine.  MEDICATIONS:  Current Outpatient Medications  Medication Sig Dispense Refill   albuterol  (VENTOLIN  HFA) 108 (90 Base) MCG/ACT inhaler Inhale 2 puffs into the lungs every 6 (six) hours as needed for wheezing or shortness of breath. 8.5 g 0   alum & mag hydroxide-simeth (MAALOX/MYLANTA) 200-200-20 MG/5ML suspension Take 15 mLs by mouth every 6 (six) hours as needed for  indigestion or heartburn. 355 mL 0   chlorhexidine  (HIBICLENS ) 4 % external liquid Apply 15 mLs (1 Application total) topically as directed for 30 doses. Use as directed daily for 5 days every other week for 6 weeks. 946 mL 1   Cholecalciferol (VITAMIN D ) 50 MCG (2000 UT) tablet Take 2,000 Units by mouth daily.     dexamethasone  (DECADRON ) 4 MG tablet Take 1 tab 2 times daily starting day before pemetrexed. Then take 2 tabs daily x 3 days starting day after carboplatin . Take with food. 30 tablet 1   docusate sodium  (COLACE) 100 MG capsule Take 1 capsule (100 mg total) by mouth 2 (two) times daily. 60 capsule 2   famotidine  (PEPCID ) 40 MG tablet Take 40 mg by mouth at bedtime.     ferrous sulfate  325 (65 FE) MG tablet Take 325 mg by mouth daily with breakfast. (Patient not taking: Reported on 11/19/2023)     fexofenadine (ALLEGRA) 180 MG tablet Take 180 mg by mouth daily as needed for allergies.     fluticasone  (FLONASE ) 50 MCG/ACT nasal spray Place 1 spray into both nostrils as needed for allergies.     folic acid  (FOLVITE ) 1 MG tablet Take 1 tablet (1 mg total) by mouth daily. Start 7 days before pemetrexed chemotherapy. Continue until 21 days after pemetrexed completed. (Patient not taking: Reported on 11/19/2023) 100 tablet 3   HYDROcodone -acetaminophen  (NORCO/VICODIN) 5-325 MG tablet Take 1 tablet by mouth every 6 (six) hours as needed for moderate pain (pain score 4-6). 30 tablet 0   levothyroxine  (SYNTHROID ) 100 MCG tablet Take 1 tablet (100 mcg total) by mouth daily. One po qd 90 tablet 0   lidocaine  (LIDODERM ) 5 % Place 1 patch onto the skin daily.     lidocaine -prilocaine  (EMLA ) cream Apply to affected area once (Patient not taking: Reported on 11/19/2023) 30 g 3   LORazepam  (ATIVAN ) 0.5 MG tablet Take 0.5 mg by mouth at bedtime.  (Patient not taking: Reported on 11/19/2023)     MAGNESIUM  PO Take 1 tablet by mouth daily.     melatonin 5 MG TABS Take 5 mg by mouth at bedtime as needed (sleep).      metoprolol  succinate (TOPROL -XL) 50 MG 24 hr tablet Take 1 tablet (50 mg total) by mouth daily. 90 tablet 3   MYRBETRIQ  50 MG TB24 tablet Take 50 mg by mouth daily.     nystatin (MYCOSTATIN) 100000 UNIT/ML suspension Take 5 mLs (500,000 Units total) by mouth 4 (four) times daily for 10 days. 200 mL 0   ondansetron  (ZOFRAN ) 4 MG tablet Take 1 tablet (4 mg total) by mouth every 6 (six) hours as needed for nausea. (Patient not taking: Reported on 11/19/2023) 20 tablet 0   osimertinib  mesylate (TAGRISSO ) 80 MG tablet Take 1 tablet (80 mg total) by mouth daily. 30 tablet 3   oxyCODONE  (OXY IR/ROXICODONE ) 5 MG immediate release tablet Take 5 mg by mouth every 6 (six) hours as needed for severe pain (pain score 7-10). (Patient not taking: Reported on 11/19/2023)     POTASSIUM PO Take 1 tablet by mouth daily.     PREMARIN vaginal cream Place 1  applicator vaginally as needed (urinary issues). (Patient not taking: Reported on 11/19/2023)     prochlorperazine  (COMPAZINE ) 10 MG tablet Take 1 tablet (10 mg total) by mouth every 6 (six) hours as needed for nausea or vomiting. 30 tablet 1   RABEprazole  (ACIPHEX ) 20 MG tablet Take 1 tablet (20 mg total) by mouth 2 (two) times a week.     rosuvastatin  (CRESTOR ) 5 MG tablet Take 1 tablet (5 mg total) by mouth daily. 90 tablet 3   sodium chloride  1 g tablet Take 1 tablet (1 g total) by mouth 2 (two) times daily with a meal. 60 tablet 0   STIOLTO RESPIMAT  2.5-2.5 MCG/ACT AERS INHALE TWO PUFFS INTO THE LUNGS DAILY 4 g 5   triamcinolone  cream (KENALOG ) 0.1 % Apply 1 Application topically 2 (two) times daily. As needed for itching/rash 453.6 g 0   vitamin B-12 (CYANOCOBALAMIN ) 1000 MCG tablet Take 1,000 mcg by mouth daily.     No current facility-administered medications for this visit.    SURGICAL HISTORY:  Past Surgical History:  Procedure Laterality Date   APPENDECTOMY     BRONCHIAL BIOPSY  10/20/2019   Procedure: BRONCHIAL BIOPSIES;  Surgeon: Shelah Lamar RAMAN,  MD;  Location: [facility];  Service: Pulmonary;;   BRONCHIAL BRUSHINGS  10/20/2019   Procedure: BRONCHIAL BRUSHINGS;  Surgeon: Shelah Lamar RAMAN, MD;  Location: [facility];  Service: Pulmonary;;   BRONCHIAL NEEDLE ASPIRATION BIOPSY  10/20/2019   Procedure: BRONCHIAL NEEDLE ASPIRATION BIOPSIES;  Surgeon: Shelah Lamar RAMAN, MD;  Location: [facility] ENDOSCOPY;  Service: Pulmonary;;   BRONCHIAL WASHINGS  10/20/2019   Procedure: BRONCHIAL WASHINGS;  Surgeon: Shelah Lamar RAMAN, MD;  Location: [facility];  Service: Pulmonary;;   CARPAL TUNNEL RELEASE Right 2011   Dr. Camella   CHOLECYSTECTOMY N/A    COLPORRHAPHY     posterior   HAND SURGERY Right 2016   Nerve surgery, Dr. Camella   IR KYPHO LUMBAR INC FX REDUCE BONE BX UNI/BIL CANNULATION INC/IMAGING  07/03/2023   OVARIAN CYST REMOVAL     RECTOCELE REPAIR  2011   w/TVH and sling   TONSILLECTOMY     TONSILLECTOMY     TOTAL KNEE ARTHROPLASTY Left 09/09/2018   Procedure: TOTAL KNEE ARTHROPLASTY, CORTISONE INJECTION RIGHT KNEE;  Surgeon: Ernie Cough, MD;  Location: [facility]S;  Service: Orthopedics;  Laterality: Left;  70 mins   TOTAL KNEE ARTHROPLASTY Right 04/16/2023   Procedure: TOTAL KNEE ARTHROPLASTY;  Surgeon: Ernie Cough, MD;  Location: [facility];  Service: Orthopedics;  Laterality: Right;   TOTAL VAGINAL HYSTERECTOMY  10/18/2009   rectocele repair, sling   TUBAL LIGATION Bilateral    VARICOSE VEIN SURGERY     VIDEO BRONCHOSCOPY WITH ENDOBRONCHIAL NAVIGATION N/A 10/20/2019   Procedure: VIDEO BRONCHOSCOPY WITH ENDOBRONCHIAL NAVIGATION;  Surgeon: Shelah Lamar RAMAN, MD;  Location: [facility];  Service: Pulmonary;  Laterality: N/A;    REVIEW OF SYSTEMS:  Constitutional: positive for anorexia, fatigue, and weight loss Eyes: negative Ears, nose, mouth, throat, and face: positive for sore throat Respiratory: negative Cardiovascular: negative Gastrointestinal: negative Genitourinary:negative Integument/breast: negative Hematologic/lymphatic:  negative Musculoskeletal:positive for arthralgias and back pain Neurological: positive for dizziness Behavioral/Psych: negative Endocrine: negative Allergic/Immunologic: negative   PHYSICAL EXAMINATION: General appearance: alert, cooperative, fatigued, and no distress Head: Normocephalic, without obvious abnormality, atraumatic Neck: no adenopathy, no JVD, supple, symmetrical, trachea midline, and thyroid  not enlarged, symmetric, no tenderness/mass/nodules Lymph nodes: Cervical, supraclavicular, and axillary nodes normal. Resp: clear to auscultation bilaterally Back: symmetric, no curvature. ROM normal. No  CVA tenderness. Cardio: regular rate and rhythm, S1, S2 normal, no murmur, click, rub or gallop GI: soft, non-tender; bowel sounds normal; no masses,  no organomegaly Extremities: extremities normal, atraumatic, no cyanosis or edema Neurologic: Alert and oriented X 3, normal strength and tone. Normal symmetric reflexes. Normal coordination and gait  ECOG PERFORMANCE STATUS: 1 - Symptomatic but completely ambulatory  Blood pressure 114/62, pulse (!) 58, temperature (!) 97.3 F (36.3 C), resp. rate 17, height 5' 5.5 (1.664 m), weight 116 lb 4.8 oz (52.8 kg), last menstrual period 08/21/2002, SpO2 98%.  LABORATORY DATA: Lab Results  Component Value Date   WBC 8.3 11/21/2023   HGB 11.5 (L) 11/21/2023   HCT 32.5 (L) 11/21/2023   MCV 89.8 11/21/2023   PLT 189 11/21/2023      Chemistry      Component Value Date/Time   NA 124 (L) 11/21/2023 0808   K 5.3 (H) 11/21/2023 0808   CL 94 (L) 11/21/2023 0808   CO2 23 11/21/2023 0808   BUN 19 11/21/2023 0808   CREATININE 1.04 (H) 11/21/2023 0808      Component Value Date/Time   CALCIUM  9.8 11/21/2023 0808   ALKPHOS 102 11/21/2023 0808   AST 40 11/21/2023 0808   ALT 98 (H) 11/21/2023 0808   BILITOT 0.3 11/21/2023 0808       RADIOGRAPHIC STUDIES: MM 3D SCREENING MAMMOGRAM BILATERAL BREAST Result Date: 11/15/2023 CLINICAL DATA:   Screening. EXAM: DIGITAL SCREENING BILATERAL MAMMOGRAM WITH TOMOSYNTHESIS AND CAD TECHNIQUE: Bilateral screening digital craniocaudal and mediolateral oblique mammograms were obtained. Bilateral screening digital breast tomosynthesis was performed. The images were evaluated with computer-aided detection. COMPARISON:  Previous exam(s). ACR Breast Density Category b: There are scattered areas of fibroglandular density. FINDINGS: There are no findings suspicious for malignancy. IMPRESSION: No mammographic evidence of malignancy. A result letter of this screening mammogram will be mailed directly to the patient. RECOMMENDATION: Screening mammogram in one year. (Code:SM-B-01Y) BI-RADS CATEGORY  1: Negative. Electronically Signed   By: Dina  Arceo M.D.   On: 11/15/2023 09:26     ASSESSMENT AND PLAN: This is a very pleasant 73 years old white Woods recently diagnosed with a stage IIIc/IV (T3, N3, M0/M1a) non-small cell lung cancer, adenocarcinoma presented with large left upper lobe lung mass in addition to left infrahilar, right hilar and subcarinal lymphadenopathy and small left pleural effusion diagnosed in August 2021 with positive EGFR mutation in exon 21 (L858R). The patient completed a course of concurrent chemoradiation with weekly carboplatin  and paclitaxel  status post 7 cycles.  She tolerated her treatment well except for fatigue as well as a skin burn and cough. Her scan showed mild improvement of the left upper lobe lung mass as well as the mediastinal lymphadenopathy.  The patient continues to have small left pleural effusion that is suspicious given her presentation. She is currently on treatment with Tagrisso  80 mg p.o. daily started on January 31, 2020.  She is status post 42 months of treatment.  Her treatment is currently on hold for the last 4 months because of the adverse effect and fatigue. She also underwent kyphoplasty of L2 vertebral lesion and the biopsy was negative for malignancy. She  had a PET scan performed recently.  I personally and independently reviewed the PET scan images and discussed the result and showed the images to the patient and her daughter.  Unfortunately her PET scan showed clear evidence for disease progression especially with bone metastasis in addition to increasing activity in the left hilar  area and suspicious left axillary lymphadenopathy. She is currently undergoing systemic chemotherapy with carboplatin  for AUC of 5, Alimta 500 mg/M2 in addition to Tagrisso  80 mg p.o. daily.  She started the first dose of her chemotherapy last week. She tolerated the first week of her treatment fairly well except for sore mouth. Assessment and Plan Assessment & Plan Metastatic lung adenocarcinoma on systemic therapy (Tagrisso , carboplatin , pemetrexed) with recent disease progression Metastatic lung adenocarcinoma with recent disease progression while off Tagrisso . Resumed treatment with Tagrisso  and started systemic chemotherapy with carboplatin  and pemetrexed. Awaiting biopsy to rule out small cell lung cancer due to hyponatremia and cancer behavior. She reports feeling better post-chemotherapy. - Continue Tagrisso , carboplatin , and pemetrexed therapy - Perform CT-guided biopsy of abdominal lymph nodes on December 09, 2023 - Discuss biopsy results during next visit  Sore mouth improvement, though lips remain raw.  Will give her a prescription for viscous lidocaine   Chemotherapy-induced anemia Mild anemia expected post-chemotherapy.  Chemotherapy-induced thrombocytopenia Thrombocytopenia expected post-chemotherapy.  Hyponatremia Hyponatremia possibly related to cancer behavior. She is taking salt tablets twice daily.  Repeat comprehensive metabolic panel today showed improvement of her sodium up to 131.  She will continue her salt tablet twice daily for now.  Follow-Up - Schedule follow-up visit in two weeks for cycle two of chemotherapy and to discuss biopsy  results She was advised to call immediately if she has any concerning symptoms in the interval. The patient voices understanding of current disease status and treatment options and is in agreement with the current care plan.  All questions were answered. The patient knows to call the clinic with any problems, questions or concerns. We can certainly see the patient much sooner if necessary. The total time spent in the appointment was 30 minutes including review of chart and various tests results, discussions about plan of care and coordination of care plan .  Disclaimer: This note was dictated with voice recognition software. Similar sounding words can inadvertently be transcribed and may not be corrected upon review.

## 2023-12-02 ENCOUNTER — Ambulatory Visit
Admission: RE | Admit: 2023-12-02 | Discharge: 2023-12-02 | Disposition: A | Discharge status: Home / Self Care | Source: Ambulatory Visit | Attending: Radiation Oncology | Admitting: Radiation Oncology

## 2023-12-02 ENCOUNTER — Other Ambulatory Visit: Payer: Self-pay

## 2023-12-02 DIAGNOSIS — Z510 Encounter for antineoplastic radiation therapy: Secondary | ICD-10-CM | POA: Diagnosis not present

## 2023-12-02 DIAGNOSIS — C3492 Malignant neoplasm of unspecified part of left bronchus or lung: Secondary | ICD-10-CM

## 2023-12-02 LAB — RAD ONC ARIA SESSION SUMMARY
Course Elapsed Days: 0
Plan Fractions Treated to Date: 1
Plan Prescribed Dose Per Fraction: 3 Gy
Plan Total Fractions Prescribed: 10
Plan Total Prescribed Dose: 30 Gy
Reference Point Dosage Given to Date: 3 Gy
Reference Point Session Dosage Given: 3 Gy
Session Number: 1

## 2023-12-03 ENCOUNTER — Ambulatory Visit (INDEPENDENT_AMBULATORY_CARE_PROVIDER_SITE_OTHER): Admitting: Internal Medicine

## 2023-12-03 ENCOUNTER — Other Ambulatory Visit: Payer: Self-pay

## 2023-12-03 ENCOUNTER — Ambulatory Visit
Admission: RE | Admit: 2023-12-03 | Discharge: 2023-12-03 | Disposition: A | Discharge status: Home / Self Care | Source: Ambulatory Visit | Attending: Radiation Oncology | Admitting: Radiation Oncology

## 2023-12-03 ENCOUNTER — Encounter: Payer: Self-pay | Admitting: Internal Medicine

## 2023-12-03 VITALS — BP 91/63 | HR 76 | Temp 96.9°F | Resp 16 | Wt 120.0 lb

## 2023-12-03 DIAGNOSIS — D849 Immunodeficiency, unspecified: Secondary | ICD-10-CM | POA: Diagnosis not present

## 2023-12-03 DIAGNOSIS — R197 Diarrhea, unspecified: Secondary | ICD-10-CM | POA: Diagnosis not present

## 2023-12-03 DIAGNOSIS — N390 Urinary tract infection, site not specified: Secondary | ICD-10-CM | POA: Diagnosis not present

## 2023-12-03 DIAGNOSIS — C3490 Malignant neoplasm of unspecified part of unspecified bronchus or lung: Secondary | ICD-10-CM

## 2023-12-03 DIAGNOSIS — Z510 Encounter for antineoplastic radiation therapy: Secondary | ICD-10-CM | POA: Diagnosis not present

## 2023-12-03 LAB — RAD ONC ARIA SESSION SUMMARY
Course Elapsed Days: 1
Plan Fractions Treated to Date: 2
Plan Prescribed Dose Per Fraction: 3 Gy
Plan Total Fractions Prescribed: 10
Plan Total Prescribed Dose: 30 Gy
Reference Point Dosage Given to Date: 6 Gy
Reference Point Session Dosage Given: 3 Gy
Session Number: 2

## 2023-12-03 MED ORDER — VITAMIN C 500 MG PO CAPS: 500.0000 mg | ORAL_CAPSULE | Freq: Two times a day (BID) | ORAL | 11 refills | Status: DC

## 2023-12-03 MED ORDER — METHENAMINE HIPPURATE 1 G PO TABS: 1.0000 g | ORAL_TABLET | Freq: Two times a day (BID) | ORAL | 11 refills | Status: DC

## 2023-12-03 NOTE — Progress Notes (Signed)
 [facility]  Reason for Consult:recurrent uti Referring Provider: Glendia Elizabeth, urology    Patient Active Problem List   Diagnosis Date Noted   Biliary colic 06/28/2023   Right sided abdominal pain 05/26/2023   Mixed hyperlipidemia 05/26/2023   Acute respiratory failure with hypoxia (HCC) 05/26/2023   COPD with acute exacerbation (HCC) 05/26/2023   Multifocal atrial tachycardia 05/26/2023   Acute cholecystitis 05/25/2023   S/P total knee arthroplasty, right 04/16/2023   Allergic rhinitis 10/19/2020   Asthma, chronic 10/19/2020   Cough 08/09/2020   Drug-induced diarrhea 02/22/2020   Adenocarcinoma of left lung, stage 3 (HCC) 11/03/2019   Encounter for antineoplastic chemotherapy 11/03/2019   Goals of care, counseling/discussion 11/03/2019   Pneumothorax, left 10/20/2019   Mass of left lung 10/16/2019   History of adenomatous polyp of colon 09/29/2018   Diverticulosis 09/29/2018   Internal hemorrhoids 09/29/2018   S/P left TKA 09/09/2018   Status post total left knee replacement 09/09/2018   Pain of left heel 07/03/2017   Pain in joint of right hip 05/17/2017   Benign essential hypertension 11/17/2015   Abnormal weight gain 11/17/2015   Obesity, Class I, BMI 30-34.9 11/17/2015   Gastroesophageal reflux disease without esophagitis 10/12/2014   Hypertriglyceridemia 10/12/2014   Essential hypertension 05/21/2013   Acquired hypothyroidism 05/21/2013   Vitamin D  deficiency 05/21/2013      HPI: Linda Woods is a 73 y.o. female hx left total knee replacement, left lung nonsmallcell cancer stage 3 (t3, n3, m0) started chemoradiation in 2021, complicated by disease progression/metastatic (to the bone) restarted chemo 11/21/23 (alimta, tagrisso , and carboplatin , copd/asthma, referred by urology for recurrent uti  [place] 10/2023 for fever/chill/ams/headache. Had LP there and placement of foley catheter but  removed  She had some irritation with urination immediately after catehter removed but that resolved. She was then asked to do urine culture 11/20/23 which was negative  She feels well and no uti sx (she said she knows what uti sx are)  Currenlty getting chemo for lung cancer and radiation.   I reviewed office note from urology 09/2022 No urine cx previously She has had pyridium and chronic cipro  for years. I noticed intermittent burning reported on chart but when I asked patient she thinks she had uti sx after the catheter was removed 10/2023, and the episode before that was 2 months ago  She never developed sepsis or requires admission for uti   She never had cdiff  She does experience oral thrush from chemo and antibiotics now, and is taking nystatin squish/spit about to finish. Celestino is clearing up  No muscle ache  She has back pain from metastatic disease in the lumbar area; no lower ext neurological deficit  She also reports diarrhea from chemo pills   She has no port   Review of Systems: ROS All other ros negative      Past Medical History:  Diagnosis Date   Anemia    as a child   Anxiety    Asthma 10/19/2020   Bursitis    Chronic reflux esophagitis    Complication of anesthesia    Diarrhea, functional    Diverticulosis    GERD (gastroesophageal reflux disease)    History of kidney stones    Hypertension    Knee pain    right knee-seeing ortho   lung ca 09/2019   Osteoarthritis    Plantar fasciitis    Pneumonia  PONV (postoperative nausea and vomiting)    has used the patch before and that helps   Pure hypercholesterolemia    RLS (restless legs syndrome)    Superficial vein thrombosis    Thyroid  disease    hypothyroid    Social History   Tobacco Use   Smoking status: Never   Smokeless tobacco: Never  Vaping Use   Vaping status: Never Used  Substance Use Topics   Alcohol use: Not Currently    Alcohol/week: 1.0 standard drink of alcohol     Types: 1 Glasses of wine per week   Drug use: No    Family History  Problem Relation Age of Onset   Cervical cancer Mother    Heart failure Mother    Heart failure Father    Diabetes Father    Cancer Brother    Breast cancer Maternal Aunt    Breast cancer Paternal Aunt     Allergies  Allergen Reactions   2,4-D Dimethylamine Other (See Comments)    Product containing 3-hydroxy-3-methylglutaryl-coenzyme A reductase inhibitor (product)    OBJECTIVE: There were no vitals filed for this visit. There is no height or weight on file to calculate BMI.   Physical Exam General/constitutional: no distress, pleasant HEENT: Normocephalic, PER, Conj Clear, EOMI, Oropharynx clear; lower lip ulcer small 3 mm Neck supple CV: rrr no mrg Lungs: clear to auscultation, normal respiratory effort Abd: Soft, Nontender Ext: no edema Skin: No Rash Neuro: nonfocal MSK: no peripheral joint swelling/tenderness/warmth; back spines nontender  Lab: Lab Results  Component Value Date   WBC 3.3 (L) 11/28/2023   HGB 11.8 (L) 11/28/2023   HCT 34.3 (L) 11/28/2023   MCV 91.7 11/28/2023   PLT 95 (L) 11/28/2023   Last metabolic panel Lab Results  Component Value Date   GLUCOSE 100 (H) 11/28/2023   NA 131 (L) 11/28/2023   K 4.9 11/28/2023   CL 101 11/28/2023   CO2 25 11/28/2023   BUN 33 (H) 11/28/2023   CREATININE 1.02 (H) 11/28/2023   GFRNONAA 58 (L) 11/28/2023   CALCIUM  9.0 11/28/2023   PHOS 3.7 05/26/2023   PROT 6.1 (L) 11/28/2023   ALBUMIN 3.8 11/28/2023   BILITOT 0.4 11/28/2023   ALKPHOS 77 11/28/2023   AST 26 11/28/2023   ALT 83 (H) 11/28/2023   ANIONGAP 5 11/28/2023    Microbiology:  Serology:  Imaging: Reviewed   09/30/23 pet scan FINDINGS: Mediastinal blood pool activity: SUV max 3.2   Liver activity: SUV max NA   NECK: No hypermetabolic lymph nodes in the neck.   Incidental CT findings: None.   CHEST: Small left axillary nodes new since prior PET-CT and  similar to prior chest CT shows low level hypermetabolism with SUV max = 2.9.   The consolidative post treatment scarring in the left suprahilar and paraspinal lung shows low level hypermetabolism with SUV max = 5.8.   Focal hypermetabolism in the retro hilar right lung associated with scarring with SUV max = 4.5.   Incidental CT findings: Coronary artery calcification is evident. Mild atherosclerotic calcification is noted in the wall of the thoracic aorta. 3.6 cm low-density lesion in the no mediastinum (95/4) is similar to 09/30/2023 but progressive since 03/01/2023. No hypermetabolism.   ABDOMEN/PELVIS: No abnormal hypermetabolic activity within the liver, pancreas, or spleen. Right adrenal nodule described as enlarging on recent CT scan shows no hypermetabolism on PET imaging ( SUV max = 2.8).   Ill-defined 12 mm short axis left para-aortic node on 125/4 is  markedly hypermetabolic with SUV max = 9.4.   No hypermetabolic lymphadenopathy in the pelvis.   Incidental CT findings: Mild to moderate left hydronephrosis is stable since CT of 09/30/2023 and new since PET-CT of 03/01/2023. No obstructing stone at the UPJ and left ureter is nondilated. No obstructing mass lesion evident.   There is mild atherosclerotic calcification of the abdominal aorta without aneurysm. Insert diverticulosis left   SKELETON: Multiple new hypermetabolic skeletal metastases are identified. 1.4 cm sclerotic lesion in the L5 vertebral body demonstrates SUV max = 9.9. Hypermetabolic right sacral metastasis demonstrates SUV max = 7.0. Additional hypermetabolic lesions are seen in the bony pelvis, sacrum and thoracolumbar spine. Hypermetabolic lesion in the right T11 vertebral body demonstrates SUV max = 6.5.   Incidental CT findings: Prior upper lumbar vertebral augmentation.   IMPRESSION: 1. Interval progression of disease with multiple new hypermetabolic skeletal metastases. 2. Hypermetabolic  left para-aortic lymph node in the abdomen is consistent with metastatic disease, new since prior PET-CT. 3. Small left axillary nodes show low level hypermetabolism, nonspecific but concerning for metastatic involvement. 4. Mild to moderate left hydronephrosis without obstructing stone or mass lesion evident. This finding is stable since 09/30/2023, but new since prior PET-CT of 03/01/2023. 5. Right adrenal nodule described as enlarging on recent CT scan shows no hypermetabolism on PET imaging. 6.  Aortic Atherosclerosis  Assessment/plan: Problem List Items Addressed This Visit   None Visit Diagnoses       Recurrent UTI    -  Primary   Relevant Medications   methenamine (HIPREX) 1 g tablet     Immunosuppressed status         Primary malignant neoplasm of lung metastatic to other site, unspecified laterality (HCC)       Relevant Medications   methenamine (HIPREX) 1 g tablet       Patient referred for ?recurrent uti She is well versed  in uti sx. She doesn't believe she has frequent or severe uti She has chronic diarrhea, immunosuppressed, and recent thrush and concern about potential interaction with chronic abx  She would like to stop cipro  now. She has been on this for at least a year  Prior micro/ucx: 11/20/23 ucx ngtd   We can try methenamine/vit c to prevent  Discuss no perfect preventative med regimen and long term beyond a year or 2 likely doesn't matter what she takes   Follow up in 3 months  Avoid urine cx unless sx of uti. Discussed concept of assymptomatic bacteriuria     Follow-up: Return in about 3 months (around 03/04/2024).  Constance ONEIDA Passer, MD [facility] 12/03/2023, 10:10 AM

## 2023-12-03 NOTE — Patient Instructions (Signed)
 Let's stop cipro  Too much risk for minimal sx (cdiff, thrush, tendonopathy)   Start preventative non-pharmacologic medications: Vitamin c and methenamine twice a day   If you don't have uti sx that last more than 3 days then avoid getting urine culture   Follow up in 3 months

## 2023-12-04 ENCOUNTER — Ambulatory Visit
Admission: RE | Admit: 2023-12-04 | Discharge: 2023-12-04 | Disposition: A | Discharge status: Home / Self Care | Source: Ambulatory Visit | Attending: Radiation Oncology | Admitting: Radiation Oncology

## 2023-12-04 ENCOUNTER — Other Ambulatory Visit: Payer: Self-pay

## 2023-12-04 ENCOUNTER — Telehealth: Payer: Self-pay | Admitting: Medical Oncology

## 2023-12-04 ENCOUNTER — Other Ambulatory Visit (HOSPITAL_COMMUNITY): Payer: Self-pay

## 2023-12-04 DIAGNOSIS — Z510 Encounter for antineoplastic radiation therapy: Secondary | ICD-10-CM | POA: Diagnosis not present

## 2023-12-04 LAB — RAD ONC ARIA SESSION SUMMARY
Course Elapsed Days: 2
Plan Fractions Treated to Date: 3
Plan Prescribed Dose Per Fraction: 3 Gy
Plan Total Fractions Prescribed: 10
Plan Total Prescribed Dose: 30 Gy
Reference Point Dosage Given to Date: 9 Gy
Reference Point Session Dosage Given: 3 Gy
Session Number: 3

## 2023-12-04 NOTE — Telephone Encounter (Signed)
 She needs refill for Tagrisso . She has 7 tablets left from her prescription she had when it was put on hold

## 2023-12-04 NOTE — Progress Notes (Signed)
 Specialty Pharmacy Initial Fill Coordination Note  Linda Woods is a 73 y.o. female contacted today regarding refills of specialty medication(s) Osimertinib  Mesylate (TAGRISSO ) .  Patient requested [name] at [facility] at [place]  on 12/05/23   Medication will be filled on 12/03/24.   Patient is aware of $0.00 copayment.

## 2023-12-04 NOTE — Progress Notes (Signed)
 Specialty Pharmacy Initiation Note   Linda Woods is a 73 y.o. female who will be followed by the specialty pharmacy service for [facility] Oncology    Review of administration, indication, effectiveness, safety, potential side effects, storage/disposable, and missed dose instructions occurred today for patient's specialty medication(s) Tagrisso  (osimertinib )    Patient/Caregiver did not have any additional questions or concerns.   Patient's therapy is appropriate to: Continue  Patient was initially counseled on Tagrisso  in telephone encounter from 01/28/20.  Tagrisso  held starting in late May 2025 and was resumed in October 2025.    Goals Addressed             This Visit's Progress    Slow Disease Progression       Patient is on track. Patient will maintain adherence         Linda Woods, PharmD, BCPS, [facility] Hematology/Oncology Clinical Pharmacist [facility] and [facility] Oral Chemotherapy [facility] [phone] 12/04/2023 9:09 AM

## 2023-12-05 ENCOUNTER — Ambulatory Visit
Admission: RE | Admit: 2023-12-05 | Discharge: 2023-12-05 | Disposition: A | Discharge status: Home / Self Care | Source: Ambulatory Visit | Attending: Radiation Oncology | Admitting: Radiation Oncology

## 2023-12-05 ENCOUNTER — Inpatient Hospital Stay

## 2023-12-05 ENCOUNTER — Other Ambulatory Visit: Payer: Self-pay

## 2023-12-05 DIAGNOSIS — Z5111 Encounter for antineoplastic chemotherapy: Secondary | ICD-10-CM | POA: Diagnosis not present

## 2023-12-05 DIAGNOSIS — Z510 Encounter for antineoplastic radiation therapy: Secondary | ICD-10-CM | POA: Diagnosis not present

## 2023-12-05 DIAGNOSIS — C3492 Malignant neoplasm of unspecified part of left bronchus or lung: Secondary | ICD-10-CM

## 2023-12-05 LAB — CBC WITH DIFFERENTIAL (CANCER CENTER ONLY)
Abs Immature Granulocytes: 0 K/uL (ref 0.00–0.07)
Basophils Absolute: 0 K/uL (ref 0.0–0.1)
Basophils Relative: 0 %
Eosinophils Absolute: 0 K/uL (ref 0.0–0.5)
Eosinophils Relative: 3 %
HCT: 26.6 % — ABNORMAL LOW (ref 36.0–46.0)
Hemoglobin: 9 g/dL — ABNORMAL LOW (ref 12.0–15.0)
Immature Granulocytes: 0 %
Lymphocytes Relative: 31 %
Lymphs Abs: 0.4 K/uL — ABNORMAL LOW (ref 0.7–4.0)
MCH: 31.9 pg (ref 26.0–34.0)
MCHC: 33.8 g/dL (ref 30.0–36.0)
MCV: 94.3 fL (ref 80.0–100.0)
Monocytes Absolute: 0.2 K/uL (ref 0.1–1.0)
Monocytes Relative: 11 %
Neutro Abs: 0.8 K/uL — ABNORMAL LOW (ref 1.7–7.7)
Neutrophils Relative %: 55 %
Platelet Count: 60 K/uL — ABNORMAL LOW (ref 150–400)
RBC: 2.82 MIL/uL — ABNORMAL LOW (ref 3.87–5.11)
RDW: 14 % (ref 11.5–15.5)
WBC Count: 1.4 K/uL — ABNORMAL LOW (ref 4.0–10.5)
nRBC: 0 % (ref 0.0–0.2)

## 2023-12-05 LAB — RAD ONC ARIA SESSION SUMMARY
Course Elapsed Days: 3
Plan Fractions Treated to Date: 4
Plan Prescribed Dose Per Fraction: 3 Gy
Plan Total Fractions Prescribed: 10
Plan Total Prescribed Dose: 30 Gy
Reference Point Dosage Given to Date: 12 Gy
Reference Point Session Dosage Given: 3 Gy
Session Number: 4

## 2023-12-05 LAB — CMP (CANCER CENTER ONLY)
ALT: 180 U/L — ABNORMAL HIGH (ref 0–44)
AST: 71 U/L — ABNORMAL HIGH (ref 15–41)
Albumin: 3.3 g/dL — ABNORMAL LOW (ref 3.5–5.0)
Alkaline Phosphatase: 91 U/L (ref 38–126)
Anion gap: 3 — ABNORMAL LOW (ref 5–15)
BUN: 17 mg/dL (ref 8–23)
CO2: 26 mmol/L (ref 22–32)
Calcium: 8.6 mg/dL — ABNORMAL LOW (ref 8.9–10.3)
Chloride: 108 mmol/L (ref 98–111)
Creatinine: 0.9 mg/dL (ref 0.44–1.00)
GFR, Estimated: >60 mL/min (ref >60)
Glucose, Bld: 110 mg/dL — ABNORMAL HIGH (ref 70–99)
Potassium: 4.1 mmol/L (ref 3.5–5.1)
Sodium: 137 mmol/L (ref 135–145)
Total Bilirubin: 0.2 mg/dL (ref 0.0–1.2)
Total Protein: 5.2 g/dL — ABNORMAL LOW (ref 6.5–8.1)

## 2023-12-06 ENCOUNTER — Other Ambulatory Visit: Payer: Self-pay | Admitting: Radiology

## 2023-12-06 ENCOUNTER — Other Ambulatory Visit: Payer: Self-pay

## 2023-12-06 ENCOUNTER — Ambulatory Visit
Admission: RE | Admit: 2023-12-06 | Discharge: 2023-12-06 | Disposition: A | Discharge status: Home / Self Care | Source: Ambulatory Visit | Attending: Radiation Oncology | Admitting: Radiation Oncology

## 2023-12-06 DIAGNOSIS — M899 Disorder of bone, unspecified: Secondary | ICD-10-CM

## 2023-12-06 DIAGNOSIS — Z510 Encounter for antineoplastic radiation therapy: Secondary | ICD-10-CM | POA: Diagnosis not present

## 2023-12-06 LAB — RAD ONC ARIA SESSION SUMMARY
Course Elapsed Days: 4
Plan Fractions Treated to Date: 5
Plan Prescribed Dose Per Fraction: 3 Gy
Plan Total Fractions Prescribed: 10
Plan Total Prescribed Dose: 30 Gy
Reference Point Dosage Given to Date: 15 Gy
Reference Point Session Dosage Given: 3 Gy
Session Number: 5

## 2023-12-06 NOTE — Progress Notes (Unsigned)
 [facility] OFFICE PROGRESS NOTE  Linda Rush, MD [address] [place] [address]  DIAGNOSIS: Stage IIIc (T3, N3, M0) non-small cell lung cancer, adenocarcinoma presented with large left upper lobe lung mass in addition to left infrahilar and right hilar and subcarinal lymphadenopathy diagnosed in August 2021.   Molecular Biomarkers: Insufficient tissue for foundation 1. Guardant 360 results.   DETECTED ALTERATION(S) / BIOMARKER(S)% CFDNA OR AMPLIFICATIONASSOCIATED FDA-APPROVED THERAPIESCLINICAL TRIAL AVAILABILITY   EGFR L858R, 1.2%, Afatinib, Dacomitinib, Erlotinib, Gefitinib, Osimertinib , Ramucirumab Yes [id number], 1.0%, Afatinib, Dacomitinib, Erlotinib, Gefitinib, Osimertinib , Ramucirumab Yes [id number] 1.0% None     No  PRIOR THERAPY: 1) Weekly concurrent chemoradiation with carboplatin  for an AUC of 2 and paclitaxel  45 mg/m2.  First dose expected on 11/16/2019. Status post 7 cycles.  2) Tagrisso  80 mg p.o. daily. First dose started 01/31/2020.  Status post 42 months of treatment.  She has been off treatment for 4 months based on her request and secondary to adverse effects but this was discontinued secondary to disease progression.  CURRENT THERAPY: Systemic chemotherapy with carboplatin  for AUC of 5, Alimta 500 mg/M2 and Tagrisso  80 mg p.o. daily.  First dose 11/21/2023.  Status post 1 cycle.   INTERVAL HISTORY: Linda Woods 73 y.o. female returns today for follow-up visit.  The patient has metastatic adenocarcinoma that was initially diagnosed as a stage IIIc in August 2021.  She was recently found to have progression so she is currently on targeted treatment with Tagrisso .  She also started systemic chemotherapy with carboplatin  and Alimta.  She tolerated this well overall except she experienced oral thrush.  She also takes salt tablets twice a day due to low sodium.  She denies any fever, chills, or night sweats.  Her appetite is ***.  She denies any  chest pain, shortness of breath, cough, or hemoptysis.  Denies any nausea, vomiting, diarrhea, or constipation.  Denies any headache or visual changes.  Denies any rashes or skin changes.  She is here today for evaluation and repeat blood work before undergoing cycle #2.   MEDICAL HISTORY: Past Medical History:  Diagnosis Date   Anemia    as a child   Anxiety    Asthma 10/19/2020   Bursitis    Chronic reflux esophagitis    Complication of anesthesia    Diarrhea, functional    Diverticulosis    GERD (gastroesophageal reflux disease)    History of kidney stones    Hypertension    Knee pain    right knee-seeing ortho   lung ca 09/2019   Osteoarthritis    Plantar fasciitis    Pneumonia    PONV (postoperative nausea and vomiting)    has used the patch before and that helps   Pure hypercholesterolemia    RLS (restless legs syndrome)    Superficial vein thrombosis    Thyroid  disease    hypothyroid    ALLERGIES:  is allergic to 2,4-d dimethylamine.  MEDICATIONS:  Current Outpatient Medications  Medication Sig Dispense Refill   albuterol  (VENTOLIN  HFA) 108 (90 Base) MCG/ACT inhaler Inhale 2 puffs into the lungs every 6 (six) hours as needed for wheezing or shortness of breath. 8.5 g 0   alum & mag hydroxide-simeth (MAALOX/MYLANTA) 200-200-20 MG/5ML suspension Take 15 mLs by mouth every 6 (six) hours as needed for indigestion or heartburn. 355 mL 0   Ascorbic Acid (VITAMIN C) 500 MG CAPS Take 500 mg by mouth in the morning and at bedtime. Take  with methenamine 60 capsule 11   chlorhexidine  (HIBICLENS ) 4 % external liquid Apply 15 mLs (1 Application total) topically as directed for 30 doses. Use as directed daily for 5 days every other week for 6 weeks. 946 mL 1   Cholecalciferol (VITAMIN D ) 50 MCG (2000 UT) tablet Take 2,000 Units by mouth daily.     ciprofloxacin  (CIPRO ) 500 MG tablet Take 500 mg by mouth 2 (two) times daily.     dexamethasone  (DECADRON ) 4 MG tablet Take 1 tab 2  times daily starting day before pemetrexed. Then take 2 tabs daily x 3 days starting day after carboplatin . Take with food. 30 tablet 1   docusate sodium  (COLACE) 100 MG capsule Take 1 capsule (100 mg total) by mouth 2 (two) times daily. 60 capsule 2   famotidine  (PEPCID ) 40 MG tablet Take 40 mg by mouth at bedtime.     ferrous sulfate  325 (65 FE) MG tablet Take 325 mg by mouth daily with breakfast.     fexofenadine (ALLEGRA) 180 MG tablet Take 180 mg by mouth daily as needed for allergies.     fluticasone  (FLONASE ) 50 MCG/ACT nasal spray Place 1 spray into both nostrils as needed for allergies.     folic acid  (FOLVITE ) 1 MG tablet Take 1 tablet (1 mg total) by mouth daily. Start 7 days before pemetrexed chemotherapy. Continue until 21 days after pemetrexed completed. 100 tablet 3   HYDROcodone -acetaminophen  (NORCO/VICODIN) 5-325 MG tablet Take 1 tablet by mouth every 6 (six) hours as needed for moderate pain (pain score 4-6). 30 tablet 0   levothyroxine  (SYNTHROID ) 100 MCG tablet Take 1 tablet (100 mcg total) by mouth daily. One po qd 90 tablet 0   lidocaine  (LIDODERM ) 5 % Place 1 patch onto the skin daily.     lidocaine  (XYLOCAINE ) 2 % solution Use as directed 15 mLs in the mouth or throat every 6 (six) hours as needed for mouth pain. 100 mL 1   lidocaine -prilocaine  (EMLA ) cream Apply to affected area once 30 g 3   LORazepam  (ATIVAN ) 0.5 MG tablet Take 0.5 mg by mouth at bedtime.      MAGNESIUM  PO Take 1 tablet by mouth daily.     melatonin 5 MG TABS Take 5 mg by mouth at bedtime as needed (sleep).     methenamine (HIPREX) 1 g tablet Take 1 tablet (1 g total) by mouth 2 (two) times daily with a meal. 60 tablet 11   metoprolol  succinate (TOPROL -XL) 50 MG 24 hr tablet Take 1 tablet (50 mg total) by mouth daily. 90 tablet 3   MYRBETRIQ  50 MG TB24 tablet Take 50 mg by mouth daily.     ondansetron  (ZOFRAN ) 4 MG tablet Take 1 tablet (4 mg total) by mouth every 6 (six) hours as needed for nausea. 20  tablet 0   osimertinib  mesylate (TAGRISSO ) 80 MG tablet Take 1 tablet (80 mg total) by mouth daily. 30 tablet 3   oxyCODONE  (OXY IR/ROXICODONE ) 5 MG immediate release tablet Take 5 mg by mouth every 6 (six) hours as needed for severe pain (pain score 7-10).     POTASSIUM PO Take 1 tablet by mouth daily.     PREMARIN vaginal cream Place 1 applicator vaginally as needed (urinary issues).     prochlorperazine  (COMPAZINE ) 10 MG tablet Take 1 tablet (10 mg total) by mouth every 6 (six) hours as needed for nausea or vomiting. 30 tablet 1   RABEprazole  (ACIPHEX ) 20 MG tablet Take 1 tablet (20  mg total) by mouth 2 (two) times a week.     rosuvastatin  (CRESTOR ) 5 MG tablet Take 1 tablet (5 mg total) by mouth daily. 90 tablet 3   sodium chloride  1 g tablet Take 1 tablet (1 g total) by mouth 2 (two) times daily with a meal. 60 tablet 0   STIOLTO RESPIMAT  2.5-2.5 MCG/ACT AERS INHALE TWO PUFFS INTO THE LUNGS DAILY 4 g 5   triamcinolone  cream (KENALOG ) 0.1 % Apply 1 Application topically 2 (two) times daily. As needed for itching/rash 453.6 g 0   vitamin B-12 (CYANOCOBALAMIN ) 1000 MCG tablet Take 1,000 mcg by mouth daily.     No current facility-administered medications for this visit.    SURGICAL HISTORY:  Past Surgical History:  Procedure Laterality Date   APPENDECTOMY     BRONCHIAL BIOPSY  10/20/2019   Procedure: BRONCHIAL BIOPSIES;  Surgeon: Shelah Lamar RAMAN, MD;  Location: [facility] ENDOSCOPY;  Service: Pulmonary;;   BRONCHIAL BRUSHINGS  10/20/2019   Procedure: BRONCHIAL BRUSHINGS;  Surgeon: Shelah Lamar RAMAN, MD;  Location: [facility] ENDOSCOPY;  Service: Pulmonary;;   BRONCHIAL NEEDLE ASPIRATION BIOPSY  10/20/2019   Procedure: BRONCHIAL NEEDLE ASPIRATION BIOPSIES;  Surgeon: Shelah Lamar RAMAN, MD;  Location: [facility] ENDOSCOPY;  Service: Pulmonary;;   BRONCHIAL WASHINGS  10/20/2019   Procedure: BRONCHIAL WASHINGS;  Surgeon: Shelah Lamar RAMAN, MD;  Location: [facility] ENDOSCOPY;  Service: Pulmonary;;   CARPAL TUNNEL RELEASE Right  2011   Dr. Camella   CHOLECYSTECTOMY N/A    COLPORRHAPHY     posterior   HAND SURGERY Right 2016   Nerve surgery, Dr. Camella   IR KYPHO LUMBAR INC FX REDUCE BONE BX UNI/BIL CANNULATION INC/IMAGING  07/03/2023   OVARIAN CYST REMOVAL     RECTOCELE REPAIR  2011   w/TVH and sling   TONSILLECTOMY     TONSILLECTOMY     TOTAL KNEE ARTHROPLASTY Left 09/09/2018   Procedure: TOTAL KNEE ARTHROPLASTY, CORTISONE INJECTION RIGHT KNEE;  Surgeon: Ernie Cough, MD;  Location: [facility] ORS;  Service: Orthopedics;  Laterality: Left;  70 mins   TOTAL KNEE ARTHROPLASTY Right 04/16/2023   Procedure: TOTAL KNEE ARTHROPLASTY;  Surgeon: Ernie Cough, MD;  Location: [facility] ORS;  Service: Orthopedics;  Laterality: Right;   TOTAL VAGINAL HYSTERECTOMY  10/18/2009   rectocele repair, sling   TUBAL LIGATION Bilateral    VARICOSE VEIN SURGERY     VIDEO BRONCHOSCOPY WITH ENDOBRONCHIAL NAVIGATION N/A 10/20/2019   Procedure: VIDEO BRONCHOSCOPY WITH ENDOBRONCHIAL NAVIGATION;  Surgeon: Shelah Lamar RAMAN, MD;  Location: [facility];  Service: Pulmonary;  Laterality: N/A;    REVIEW OF SYSTEMS:   Review of Systems  Constitutional: Negative for appetite change, chills, fatigue, fever and unexpected weight change.  HENT:   Negative for mouth sores, nosebleeds, sore throat and trouble swallowing.   Eyes: Negative for eye problems and icterus.  Respiratory: Negative for cough, hemoptysis, shortness of breath and wheezing.   Cardiovascular: Negative for chest pain and leg swelling.  Gastrointestinal: Negative for abdominal pain, constipation, diarrhea, nausea and vomiting.  Genitourinary: Negative for bladder incontinence, difficulty urinating, dysuria, frequency and hematuria.   Musculoskeletal: Negative for back pain, gait problem, neck pain and neck stiffness.  Skin: Negative for itching and rash.  Neurological: Negative for dizziness, extremity weakness, gait problem, headaches, light-headedness and seizures.  Hematological:  Negative for adenopathy. Does not bruise/bleed easily.  Psychiatric/Behavioral: Negative for confusion, depression and sleep disturbance. The patient is not nervous/anxious.     PHYSICAL EXAMINATION:  Last menstrual period 08/21/2002.  ECOG  PERFORMANCE STATUS: {CHL ONC ECOG [id number]  Physical Exam  Constitutional: Oriented to person, place, and time and well-developed, well-nourished, and in no distress. No distress.  HENT:  Head: Normocephalic and atraumatic.  Mouth/Throat: Oropharynx is clear and moist. No oropharyngeal exudate.  Eyes: Conjunctivae are normal. Right eye exhibits no discharge. Left eye exhibits no discharge. No scleral icterus.  Neck: Normal range of motion. Neck supple.  Cardiovascular: Normal rate, regular rhythm, normal heart sounds and intact distal pulses.   Pulmonary/Chest: Effort normal and breath sounds normal. No respiratory distress. No wheezes. No rales.  Abdominal: Soft. Bowel sounds are normal. Exhibits no distension and no mass. There is no tenderness.  Musculoskeletal: Normal range of motion. Exhibits no edema.  Lymphadenopathy:    No cervical adenopathy.  Neurological: Alert and oriented to person, place, and time. Exhibits normal muscle tone. Gait normal. Coordination normal.  Skin: Skin is warm and dry. No rash noted. Not diaphoretic. No erythema. No pallor.  Psychiatric: Mood, memory and judgment normal.  Vitals reviewed.  LABORATORY DATA: Lab Results  Component Value Date   WBC 1.4 (L) 12/05/2023   HGB 9.0 (L) 12/05/2023   HCT 26.6 (L) 12/05/2023   MCV 94.3 12/05/2023   PLT 60 (L) 12/05/2023      Chemistry      Component Value Date/Time   NA 137 12/05/2023 1458   K 4.1 12/05/2023 1458   CL 108 12/05/2023 1458   CO2 26 12/05/2023 1458   BUN 17 12/05/2023 1458   CREATININE 0.90 12/05/2023 1458      Component Value Date/Time   CALCIUM  8.6 (L) 12/05/2023 1458   ALKPHOS 91 12/05/2023 1458   AST 71 (H) 12/05/2023 1458   ALT 180  (H) 12/05/2023 1458   BILITOT 0.2 12/05/2023 1458       RADIOGRAPHIC STUDIES:  MM 3D SCREENING MAMMOGRAM BILATERAL BREAST Result Date: 11/15/2023 CLINICAL DATA:  Screening. EXAM: DIGITAL SCREENING BILATERAL MAMMOGRAM WITH TOMOSYNTHESIS AND CAD TECHNIQUE: Bilateral screening digital craniocaudal and mediolateral oblique mammograms were obtained. Bilateral screening digital breast tomosynthesis was performed. The images were evaluated with computer-aided detection. COMPARISON:  Previous exam(s). ACR Breast Density Category b: There are scattered areas of fibroglandular density. FINDINGS: There are no findings suspicious for malignancy. IMPRESSION: No mammographic evidence of malignancy. A result letter of this screening mammogram will be mailed directly to the patient. RECOMMENDATION: Screening mammogram in one year. (Code:SM-B-01Y) BI-RADS CATEGORY  1: Negative. Electronically Signed   By: Dina  Arceo M.D.   On: 11/15/2023 09:26     ASSESSMENT/PLAN:  This is a very pleasant 73 years old white female recently diagnosed with a stage IIIc/IV (T3, N3, M0/M1a) non-small cell lung cancer, adenocarcinoma presented with large left upper lobe lung mass in addition to left infrahilar, right hilar and subcarinal lymphadenopathy and small left pleural effusion diagnosed in August 2021 with positive EGFR mutation in exon 21 (L858R).   The patient completed a course of concurrent chemoradiation with weekly carboplatin  and paclitaxel  status post 7 cycles.  She tolerated her treatment well except for fatigue as well as a skin burn and cough. Her scan showed mild improvement of the left upper lobe lung mass as well as the mediastinal lymphadenopathy.  The patient continued to have small left pleural effusion that is suspicious given her presentation.  She then was on Tagrisso  80 mg p.o. daily started on January 31, 2020. She is status post 42 months of treatment. Her treatment was on hold par patient request and  fatigue.   She also underwent kyphoplasty of L2 vertebral lesion and the biopsy was negative for malignancy.   She then had a PET scan performed on *** which showed clear evidence for disease progression especially with bone metastasis in addition to increasing activity in the left hilar area and suspicious left axillary lymphadenopathy.   She is currently undergoing systemic chemotherapy with carboplatin  for an AUC of 5 and Alimta 500 mg/m2 in addition to resuming treatment with Tagrisso  80 mg p.o. daily.  She is status post her first week of treatment.  Labs were reviewed.  Recommend that she ***with cycle #2 today scheduled.  She is scheduled for a CT biopsy of the abdominal lymph node on 12/09/2023.  ***Sodium?  We will see her back in 3 weeks before undergoing cycle #3.  The patient was advised to call immediately if she has any concerning symptoms in the interval. The patient voices understanding of current disease status and treatment options and is in agreement with the current care plan. All questions were answered. The patient knows to call the clinic with any problems, questions or concerns. We can certainly see the patient much sooner if necessary   No orders of the defined types were placed in this encounter.    I spent {CHL ONC TIME VISIT - DTPQU:[id number]} counseling the patient face to face. The total time spent in the appointment was {CHL ONC TIME VISIT - DTPQU:[id number]}.  Mychaela Lennartz L Jacori Mulrooney, PA-C 12/06/23

## 2023-12-08 NOTE — H&P (Signed)
 Chief Complaint: Left para-aortic lymphadenopathy. Request is for left para- aortic lymph node biopsy   Referring Physician(s): Mohamed,Mohamed  Supervising Physician: Vanice Revel  Patient Status: [facility] - Out-pt  History of Present Illness: Linda Woods is a 73 y.o. female 73 y.o female outpatient. 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 Found to have a left para aortic  lymphadenopathy. PET scan from 9.6.25 reads Hypermetabolic left para-aortic lymph node in the abdomen is consistent with metastatic disease, new since prior PET-CT. Team is requesting left para- aortic lymph node biopsy for further evaluation of metastatic disease. Case approved by IR Attending Dr. CHARM CHARM Faes.   Currently without any significant complaints. Patient alert and laying in bed,calm. Denies any fevers, headache, chest pain, SOB, cough, abdominal pain, nausea, vomiting or bleeding.   All labs and medications are within acceptable parameters. No pertinent allergies. Patient has been NPO since midnight.  Return precautions and treatment recommendations and follow-up discussed with the patient  who is agreeable with the plan.    Past Medical History:  Diagnosis Date   Anemia    as a child   Anxiety    Asthma 10/19/2020   Bursitis    Chronic reflux esophagitis    Complication of anesthesia    Diarrhea, functional    Diverticulosis    GERD (gastroesophageal reflux disease)    History of kidney stones    Hypertension    Knee pain    right knee-seeing ortho   lung ca 09/2019   Osteoarthritis    Plantar fasciitis    Pneumonia    PONV (postoperative nausea and vomiting)    has used the patch before and that helps   Pure hypercholesterolemia    RLS (restless legs syndrome)    Superficial vein thrombosis    Thyroid  disease    hypothyroid    Past Surgical History:  Procedure Laterality Date    APPENDECTOMY     BRONCHIAL BIOPSY  10/20/2019   Procedure: BRONCHIAL BIOPSIES;  Surgeon: Shelah Lamar RAMAN, MD;  Location: [facility] ENDOSCOPY;  Service: Pulmonary;;   BRONCHIAL BRUSHINGS  10/20/2019   Procedure: BRONCHIAL BRUSHINGS;  Surgeon: Shelah Lamar RAMAN, MD;  Location: [facility]COPY;  Service: Pulmonary;;   BRONCHIAL NEEDLE ASPIRATION BIOPSY  10/20/2019   Procedure: BRONCHIAL NEEDLE ASPIRATION BIOPSIES;  Surgeon: Shelah Lamar RAMAN, MD;  Location: [facility] ENDOSCOPY;  Service: Pulmonary;;   BRONCHIAL WASHINGS  10/20/2019   Procedure: BRONCHIAL WASHINGS;  Surgeon: Shelah Lamar RAMAN, MD;  Location: [facility] ENDOSCOPY;  Service: Pulmonary;;   CARPAL TUNNEL RELEASE Right 2011   Dr. Camella   CHOLECYSTECTOMY N/A    COLPORRHAPHY     posterior   HAND SURGERY Right 2016   Nerve surgery, Dr. Camella   IR KYPHO LUMBAR INC FX REDUCE BONE BX UNI/BIL CANNULATION INC/IMAGING  07/03/2023   OVARIAN CYST REMOVAL     RECTOCELE REPAIR  2011   w/TVH and sling   TONSILLECTOMY     TONSILLECTOMY     TOTAL KNEE ARTHROPLASTY Left 09/09/2018   Procedure: TOTAL KNEE ARTHROPLASTY, CORTISONE INJECTION RIGHT KNEE;  Surgeon: Ernie Cough, MD;  Location: [facility] ORS;  Service: Orthopedics;  Laterality: Left;  70 mins   TOTAL KNEE ARTHROPLASTY Right 04/16/2023   Procedure: TOTAL KNEE ARTHROPLASTY;  Surgeon: Ernie Cough, MD;  Location: [facility] ORS;  Service: Orthopedics;  Laterality: Right;   TOTAL VAGINAL HYSTERECTOMY  10/18/2009   rectocele repair, sling   TUBAL  LIGATION Bilateral    VARICOSE VEIN SURGERY     VIDEO BRONCHOSCOPY WITH ENDOBRONCHIAL NAVIGATION N/A 10/20/2019   Procedure: VIDEO BRONCHOSCOPY WITH ENDOBRONCHIAL NAVIGATION;  Surgeon: Shelah Lamar RAMAN, MD;  Location: [facility] ENDOSCOPY;  Service: Pulmonary;  Laterality: N/A;    Allergies: 2,4-d dimethylamine  Medications: Prior to Admission medications   Medication Sig Start Date End Date Taking? Authorizing Provider  albuterol  (VENTOLIN  HFA) 108 (90 Base) MCG/ACT inhaler Inhale 2  puffs into the lungs every 6 (six) hours as needed for wheezing or shortness of breath. 01/21/23   Chandra Harlene LABOR, NP  alum & mag hydroxide-simeth (MAALOX/MYLANTA) 200-200-20 MG/5ML suspension Take 15 mLs by mouth every 6 (six) hours as needed for indigestion or heartburn. 05/30/23   Shahmehdi, Adriana LABOR, MD  Ascorbic Acid (VITAMIN C) 500 MG CAPS Take 500 mg by mouth in the morning and at bedtime. Take with methenamine 12/03/23 11/27/24  Vu, Constance T, MD  chlorhexidine  (HIBICLENS ) 4 % external liquid Apply 15 mLs (1 Application total) topically as directed for 30 doses. Use as directed daily for 5 days every other week for 6 weeks. 04/16/23   Patti Rosina SAUNDERS, PA-C  Cholecalciferol (VITAMIN D ) 50 MCG (2000 UT) tablet Take 2,000 Units by mouth daily.    [provider]  ciprofloxacin  (CIPRO ) 500 MG tablet Take 500 mg by mouth 2 (two) times daily. 09/23/23   [provider]  dexamethasone  (DECADRON ) 4 MG tablet Take 1 tab 2 times daily starting day before pemetrexed. Then take 2 tabs daily x 3 days starting day after carboplatin . Take with food. 11/12/23   Sherrod Sherrod, MD  docusate sodium  (COLACE) 100 MG capsule Take 1 capsule (100 mg total) by mouth 2 (two) times daily. 06/28/23 06/27/24  Pappayliou, Dorothyann A, DO  famotidine  (PEPCID ) 40 MG tablet Take 40 mg by mouth at bedtime.    [provider]  ferrous sulfate  325 (65 FE) MG tablet Take 325 mg by mouth daily with breakfast.    [provider]  fexofenadine (ALLEGRA) 180 MG tablet Take 180 mg by mouth daily as needed for allergies.    [provider]  fluticasone  (FLONASE ) 50 MCG/ACT nasal spray Place 1 spray into both nostrils as needed for allergies. 10/01/19   [provider]  folic acid  (FOLVITE ) 1 MG tablet Take 1 tablet (1 mg total) by mouth daily. Start 7 days before pemetrexed chemotherapy. Continue until 21 days after pemetrexed completed. 11/12/23   Sherrod Sherrod, MD   HYDROcodone -acetaminophen  (NORCO/VICODIN) 5-325 MG tablet Take 1 tablet by mouth every 6 (six) hours as needed for moderate pain (pain score 4-6). 11/19/23   Shannon Agent, MD  levothyroxine  (SYNTHROID ) 100 MCG tablet Take 1 tablet (100 mcg total) by mouth daily. One po qd 11/13/22   Cleotilde Ronal RAMAN, MD  lidocaine  (LIDODERM ) 5 % Place 1 patch onto the skin daily. 04/26/20   [provider]  lidocaine  (XYLOCAINE ) 2 % solution Use as directed 15 mLs in the mouth or throat every 6 (six) hours as needed for mouth pain. 11/28/23   Sherrod Sherrod, MD  lidocaine -prilocaine  (EMLA ) cream Apply to affected area once 11/12/23   Sherrod Sherrod, MD  LORazepam  (ATIVAN ) 0.5 MG tablet Take 0.5 mg by mouth at bedtime.     [provider]  MAGNESIUM  PO Take 1 tablet by mouth daily.    [provider]  melatonin 5 MG TABS Take 5 mg by mouth at bedtime as needed (sleep).    [provider]  methenamine (HIPREX) 1 g tablet Take 1 tablet (1 g total) by mouth 2 (two) times daily with a meal. 12/03/23 11/27/24  Vu, Constance T, MD  metoprolol  succinate (TOPROL -XL) 50 MG 24 hr tablet Take 1 tablet (50 mg total) by mouth daily. 01/02/23   Strader, Laymon HERO, PA-C  MYRBETRIQ  50 MG TB24 tablet Take 50 mg by mouth daily. 02/08/21   [provider]  ondansetron  (ZOFRAN ) 4 MG tablet Take 1 tablet (4 mg total) by mouth every 6 (six) hours as needed for nausea. 06/28/23   Pappayliou, Dorothyann A, DO  osimertinib  mesylate (TAGRISSO ) 80 MG tablet Take 1 tablet (80 mg total) by mouth daily. 11/12/23   Sherrod Sherrod, MD  oxyCODONE  (OXY IR/ROXICODONE ) 5 MG immediate release tablet Take 5 mg by mouth every 6 (six) hours as needed for severe pain (pain score 7-10). 06/20/23   [provider]  POTASSIUM PO Take 1 tablet by mouth daily.    [provider]  PREMARIN vaginal cream Place 1 applicator vaginally as needed (urinary issues). 12/19/21   [provider]   prochlorperazine  (COMPAZINE ) 10 MG tablet Take 1 tablet (10 mg total) by mouth every 6 (six) hours as needed for nausea or vomiting. 11/12/23   Sherrod Sherrod, MD  RABEprazole  (ACIPHEX ) 20 MG tablet Take 1 tablet (20 mg total) by mouth 2 (two) times a week. 03/08/16   Cleotilde Ronal RAMAN, MD  rosuvastatin  (CRESTOR ) 5 MG tablet Take 1 tablet (5 mg total) by mouth daily. 01/02/23   Johnson, Laymon HERO, PA-C  sodium chloride  1 g tablet Take 1 tablet (1 g total) by mouth 2 (two) times daily with a meal. 11/21/23   Sherrod Sherrod, MD  STIOLTO RESPIMAT  2.5-2.5 MCG/ACT AERS INHALE TWO PUFFS INTO THE LUNGS DAILY 10/16/23   Hope Almarie ORN, NP  triamcinolone  cream (KENALOG ) 0.1 % Apply 1 Application topically 2 (two) times daily. As needed for itching/rash 07/20/22   Heilingoetter, Cassandra L, PA-C  vitamin B-12 (CYANOCOBALAMIN ) 1000 MCG tablet Take 1,000 mcg by mouth daily.    [provider]     Family History  Problem Relation Age of Onset   Cervical cancer Mother    Heart failure Mother    Heart failure Father    Diabetes Father    Cancer Brother    Breast cancer Maternal Aunt    Breast cancer Paternal Aunt     Social History   Socioeconomic History   Marital status: Married    Spouse name: Not on file   Number of children: 3   Years of education: Not on file   Highest education level: Not on file  Occupational History    Employer: SELF EMPLOYED  Tobacco Use   Smoking status: Never   Smokeless tobacco: Never  Vaping Use   Vaping status: Never Used  Substance and Sexual Activity   Alcohol use: Not Currently    Alcohol/week: 1.0 standard drink of alcohol    Types: 1 Glasses of wine per week   Drug use: No   Sexual activity: Not Currently    Partners: Male    Birth control/protection: Surgical    Comment: Hysterectomy, BTL  Other Topics Concern   Not on file  Social History Narrative   Married, retired Armed forces operational officer   Caffeine- coffee, 1 cup, occass tea    Education- BS   Children- 3   Social Drivers of Corporate investment banker Strain: Not on file  Food Insecurity: No Food  Insecurity (11/19/2023)   Hunger Vital Sign    Worried About Running Out of Food in the Last Year: Never true    Ran Out of Food in the Last Year: Never true  Transportation Needs: No Transportation Needs (11/19/2023)   PRAPARE - Administrator, Civil Service (Medical): No    Lack of Transportation (Non-Medical): No  Physical Activity: Not on file  Stress: Not on file  Social Connections: Unknown (05/26/2023)   Social Connection and Isolation Panel    Frequency of Communication with Friends and Family: Patient unable to answer    Frequency of Social Gatherings with Friends and Family: Patient unable to answer    Attends Religious Services: Patient unable to answer    Active Member of Clubs or Organizations: Patient unable to answer    Attends Banker Meetings: Patient unable to answer    Marital Status: Married     Review of Systems: A 12 point ROS discussed and pertinent positives are indicated in the HPI above.  All other systems are negative.  Review of Systems  Constitutional:  Negative for fatigue and fever.  HENT:  Negative for congestion.   Respiratory:  Negative for cough and shortness of breath.   Gastrointestinal:  Negative for abdominal pain, diarrhea, nausea and vomiting.    Vital Signs: BP 106/65   Pulse 86   Temp 98 F (36.7 C)   Resp 16   Ht 5' 0.5 (1.537 m)   Wt 117 lb (53.1 kg)   LMP 08/21/2002   SpO2 99%   BMI 22.47 kg/m   Advance Care Plan: The advanced care plan/surrogate decision maker was discussed at the time of visit and the patient did not wish to discuss or was not able to name a surrogate decision maker or provide an advance care plan.    Physical Exam Vitals and nursing note reviewed.  Constitutional:      Appearance: She is well-developed.  HENT:     Head: Normocephalic and atraumatic.   Eyes:     Conjunctiva/sclera: Conjunctivae normal.  Cardiovascular:     Rate and Rhythm: Regular rhythm.  Pulmonary:     Effort: Pulmonary effort is normal.  Musculoskeletal:        General: Normal range of motion.     Cervical back: Normal range of motion.  Skin:    General: Skin is warm and dry.  Neurological:     General: No focal deficit present.     Mental Status: She is alert and oriented to person, place, and time. Mental status is at baseline.  Psychiatric:        Mood and Affect: Mood normal.        Behavior: Behavior normal.        Thought Content: Thought content normal.        Judgment: Judgment normal.     Imaging: MM 3D SCREENING MAMMOGRAM BILATERAL BREAST Result Date: 11/15/2023 CLINICAL DATA:  Screening. EXAM: DIGITAL SCREENING BILATERAL MAMMOGRAM WITH TOMOSYNTHESIS AND CAD TECHNIQUE: Bilateral screening digital craniocaudal and mediolateral oblique mammograms were obtained. Bilateral screening digital breast tomosynthesis was performed. The images were evaluated with computer-aided detection. COMPARISON:  Previous exam(s). ACR Breast Density Category b: There are scattered areas of fibroglandular density. FINDINGS: There are no findings suspicious for malignancy. IMPRESSION: No mammographic evidence of malignancy. A result letter of this screening mammogram will be mailed directly to the patient. RECOMMENDATION: Screening mammogram in one year. (Code:SM-B-01Y) BI-RADS CATEGORY  1: Negative. Electronically  Signed   By: Dina  Arceo M.D.   On: 11/15/2023 09:26    Labs:  CBC: Recent Labs    11/21/23 0808 11/28/23 1042 12/05/23 1458 12/09/23 0751  WBC 8.3 3.3* 1.4* 1.8*  HGB 11.5* 11.8* 9.0* 9.1*  HCT 32.5* 34.3* 26.6* 28.3*  PLT 189 95* 60* 105*    COAGS: Recent Labs    07/03/23 0844 12/09/23 0751  INR 1.2 1.1    BMP: Recent Labs    11/20/23 1059 11/21/23 0808 11/28/23 1042 12/05/23 1458  NA 127* 124* 131* 137  K 5.5* 5.3* 4.9 4.1  CL 96* 94*  101 108  CO2 26 23 25 26   GLUCOSE 112* 149* 100* 110*  BUN 19 19 33* 17  CALCIUM  9.5 9.8 9.0 8.6*  CREATININE 1.30* 1.04* 1.02* 0.90  GFRNONAA 44* 57* 58* >60    LIVER FUNCTION TESTS: Recent Labs    11/20/23 1059 11/21/23 0808 11/28/23 1042 12/05/23 1458  BILITOT 0.3 0.3 0.4 0.2  AST 38 40 26 71*  ALT 95* 98* 83* 180*  ALKPHOS 114 102 77 91  PROT 6.4* 6.6 6.1* 5.2*  ALBUMIN 4.2 4.2 3.8 3.3*    TUMOR MARKERS: No results for input(s): AFPTM, CEA, CA199, CHROMGRNA in the last 8760 hours.  Assessment and Plan:  73 y.o female outpatient. 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 Found to have a left para aortic  lymphadenopathy. PET scan from 9.6.25 reads Hypermetabolic left para-aortic lymph node in the abdomen is consistent with metastatic disease, new since prior PET-CT. Team is requesting left para- aortic lymph node biopsy for further evaluation of metastatic disease. Case approved by IR Attending Dr. CHARM CHARM Faes.  PLAN: IR Image Guided left para -aortic lymph node biopsy   Risks and benefits of left para - aortic lymph node biopsy.  was discussed with the patient and/or patient's family including, but not limited to bleeding, infection, damage to adjacent structures or low yield requiring additional tests.  All of the questions were answered and there is agreement to proceed.  Consent signed and in chart.   Thank you for this interesting consult.  I greatly enjoyed meeting Linda Woods and look forward to participating in their care.  A copy of this report was sent to the requesting provider on this date.  Electronically Signed: Delon JAYSON Beagle, NP 12/09/2023, 8:52 AM   I spent a total of  30 Minutes   in face to face in clinical consultation, greater than 50% of which was counseling/coordinating care for left para aortic lymph node biopsy

## 2023-12-09 ENCOUNTER — Other Ambulatory Visit: Payer: Self-pay

## 2023-12-09 ENCOUNTER — Ambulatory Visit (HOSPITAL_COMMUNITY)
Admission: RE | Admit: 2023-12-09 | Discharge: 2023-12-09 | Disposition: A | Discharge status: Home / Self Care | Source: Ambulatory Visit | Attending: Internal Medicine | Admitting: Internal Medicine

## 2023-12-09 ENCOUNTER — Ambulatory Visit
Admission: RE | Admit: 2023-12-09 | Discharge: 2023-12-09 | Disposition: A | Source: Ambulatory Visit | Attending: Radiation Oncology | Admitting: Radiation Oncology

## 2023-12-09 DIAGNOSIS — C772 Secondary and unspecified malignant neoplasm of intra-abdominal lymph nodes: Secondary | ICD-10-CM | POA: Diagnosis not present

## 2023-12-09 DIAGNOSIS — I10 Essential (primary) hypertension: Secondary | ICD-10-CM | POA: Insufficient documentation

## 2023-12-09 DIAGNOSIS — R591 Generalized enlarged lymph nodes: Secondary | ICD-10-CM | POA: Diagnosis present

## 2023-12-09 DIAGNOSIS — C3492 Malignant neoplasm of unspecified part of left bronchus or lung: Secondary | ICD-10-CM | POA: Insufficient documentation

## 2023-12-09 DIAGNOSIS — M899 Disorder of bone, unspecified: Secondary | ICD-10-CM

## 2023-12-09 DIAGNOSIS — Z510 Encounter for antineoplastic radiation therapy: Secondary | ICD-10-CM | POA: Diagnosis not present

## 2023-12-09 LAB — CBC
HCT: 28.3 % — ABNORMAL LOW (ref 36.0–46.0)
Hemoglobin: 9.1 g/dL — ABNORMAL LOW (ref 12.0–15.0)
MCH: 31.9 pg (ref 26.0–34.0)
MCHC: 32.2 g/dL (ref 30.0–36.0)
MCV: 99.3 fL (ref 80.0–100.0)
Platelets: 105 K/uL — ABNORMAL LOW (ref 150–400)
RBC: 2.85 MIL/uL — ABNORMAL LOW (ref 3.87–5.11)
RDW: 14.5 % (ref 11.5–15.5)
WBC: 1.8 K/uL — ABNORMAL LOW (ref 4.0–10.5)
nRBC: 0 % (ref 0.0–0.2)

## 2023-12-09 LAB — RAD ONC ARIA SESSION SUMMARY
Course Elapsed Days: 7
Plan Fractions Treated to Date: 6
Plan Prescribed Dose Per Fraction: 3 Gy
Plan Total Fractions Prescribed: 10
Plan Total Prescribed Dose: 30 Gy
Reference Point Dosage Given to Date: 18 Gy
Reference Point Session Dosage Given: 3 Gy
Session Number: 6

## 2023-12-09 LAB — PROTIME-INR
INR: 1.1 (ref 0.8–1.2)
Prothrombin Time: 14.8 s (ref 11.4–15.2)

## 2023-12-09 MED ORDER — LIDOCAINE 1 % OPTIME INJ - NO CHARGE
10.0000 mL | Freq: Once | INTRAMUSCULAR | Status: AC
  Administered 2023-12-09: 10 mL
  Filled 2023-12-09: qty 10

## 2023-12-09 MED ORDER — SODIUM CHLORIDE 0.9 % IV SOLN: INTRAVENOUS | Status: DC

## 2023-12-09 MED ORDER — MIDAZOLAM HCL (PF) 2 MG/2ML IJ SOLN
INTRAMUSCULAR | Status: AC | PRN
  Administered 2023-12-09: 1 mg via INTRAVENOUS
  Administered 2023-12-09: .5 mg via INTRAVENOUS

## 2023-12-09 MED ORDER — FENTANYL CITRATE (PF) 100 MCG/2ML IJ SOLN
INTRAMUSCULAR | Status: AC | PRN
  Administered 2023-12-09: 50 ug via INTRAVENOUS

## 2023-12-09 MED ORDER — MIDAZOLAM HCL 2 MG/2ML IJ SOLN
INTRAMUSCULAR | Status: AC
  Filled 2023-12-09: qty 2

## 2023-12-09 MED ORDER — ONDANSETRON HCL 4 MG/2ML IJ SOLN
INTRAMUSCULAR | Status: AC
  Filled 2023-12-09: qty 2

## 2023-12-09 MED ORDER — FENTANYL CITRATE (PF) 100 MCG/2ML IJ SOLN
INTRAMUSCULAR | Status: AC
  Filled 2023-12-09: qty 2

## 2023-12-09 MED ORDER — ONDANSETRON HCL 4 MG/2ML IJ SOLN
4.0000 mg | Freq: Once | INTRAMUSCULAR | Status: AC
  Administered 2023-12-09: 4 mg via INTRAVENOUS

## 2023-12-09 NOTE — Progress Notes (Signed)
 Patient was giving discharge instructions. Both verbalized understanding.

## 2023-12-09 NOTE — Procedures (Signed)
 Interventional Radiology Procedure Note  Procedure: CT BX LEFT PARAAORTIC PET+ ADENOPATHY    Complications: None  Estimated Blood Loss:   MIN  Findings: 18 G CORES X 3 IN FORMALIN    [name], MD

## 2023-12-10 ENCOUNTER — Encounter: Payer: Self-pay | Admitting: Internal Medicine

## 2023-12-10 ENCOUNTER — Ambulatory Visit

## 2023-12-10 ENCOUNTER — Other Ambulatory Visit: Payer: Self-pay | Admitting: Physician Assistant

## 2023-12-10 DIAGNOSIS — C3492 Malignant neoplasm of unspecified part of left bronchus or lung: Secondary | ICD-10-CM

## 2023-12-10 MED FILL — Fosaprepitant Dimeglumine For IV Infusion 150 MG (Base Eq): INTRAVENOUS | Qty: 5 | Status: AC

## 2023-12-11 ENCOUNTER — Inpatient Hospital Stay

## 2023-12-11 ENCOUNTER — Encounter: Payer: Self-pay | Admitting: Internal Medicine

## 2023-12-11 ENCOUNTER — Telehealth: Payer: Self-pay | Admitting: Physician Assistant

## 2023-12-11 ENCOUNTER — Other Ambulatory Visit: Payer: Self-pay

## 2023-12-11 ENCOUNTER — Ambulatory Visit
Admission: RE | Admit: 2023-12-11 | Discharge: 2023-12-11 | Disposition: A | Source: Ambulatory Visit | Attending: Radiation Oncology | Admitting: Radiation Oncology

## 2023-12-11 ENCOUNTER — Ambulatory Visit
Admission: RE | Admit: 2023-12-11 | Discharge: 2023-12-11 | Attending: Radiation Oncology | Admitting: Radiation Oncology

## 2023-12-11 ENCOUNTER — Inpatient Hospital Stay (HOSPITAL_BASED_OUTPATIENT_CLINIC_OR_DEPARTMENT_OTHER): Admitting: Physician Assistant

## 2023-12-11 VITALS — BP 120/60 | HR 83 | Temp 98.1°F | Resp 14 | Wt 120.1 lb

## 2023-12-11 DIAGNOSIS — E860 Dehydration: Secondary | ICD-10-CM | POA: Diagnosis not present

## 2023-12-11 DIAGNOSIS — Z510 Encounter for antineoplastic radiation therapy: Secondary | ICD-10-CM | POA: Diagnosis not present

## 2023-12-11 DIAGNOSIS — Z5111 Encounter for antineoplastic chemotherapy: Secondary | ICD-10-CM | POA: Diagnosis not present

## 2023-12-11 DIAGNOSIS — C3490 Malignant neoplasm of unspecified part of unspecified bronchus or lung: Secondary | ICD-10-CM | POA: Diagnosis not present

## 2023-12-11 DIAGNOSIS — C3492 Malignant neoplasm of unspecified part of left bronchus or lung: Secondary | ICD-10-CM | POA: Diagnosis not present

## 2023-12-11 LAB — CBC WITH DIFFERENTIAL (CANCER CENTER ONLY)
Abs Immature Granulocytes: 0.03 K/uL (ref 0.00–0.07)
Basophils Absolute: 0 K/uL (ref 0.0–0.1)
Basophils Relative: 1 %
Eosinophils Absolute: 0 K/uL (ref 0.0–0.5)
Eosinophils Relative: 2 %
HCT: 25.6 % — ABNORMAL LOW (ref 36.0–46.0)
Hemoglobin: 8.4 g/dL — ABNORMAL LOW (ref 12.0–15.0)
Immature Granulocytes: 1 %
Lymphocytes Relative: 21 %
Lymphs Abs: 0.5 K/uL — ABNORMAL LOW (ref 0.7–4.0)
MCH: 31.5 pg (ref 26.0–34.0)
MCHC: 32.8 g/dL (ref 30.0–36.0)
MCV: 95.9 fL (ref 80.0–100.0)
Monocytes Absolute: 0.5 K/uL (ref 0.1–1.0)
Monocytes Relative: 23 %
Neutro Abs: 1.1 K/uL — ABNORMAL LOW (ref 1.7–7.7)
Neutrophils Relative %: 52 %
Platelet Count: 137 K/uL — ABNORMAL LOW (ref 150–400)
RBC: 2.67 MIL/uL — ABNORMAL LOW (ref 3.87–5.11)
RDW: 14.6 % (ref 11.5–15.5)
WBC Count: 2.2 K/uL — ABNORMAL LOW (ref 4.0–10.5)
nRBC: 0 % (ref 0.0–0.2)

## 2023-12-11 LAB — CMP (CANCER CENTER ONLY)
ALT: 37 U/L (ref 0–44)
AST: 13 U/L — ABNORMAL LOW (ref 15–41)
Albumin: 3.2 g/dL — ABNORMAL LOW (ref 3.5–5.0)
Alkaline Phosphatase: 104 U/L (ref 38–126)
Anion gap: 5 (ref 5–15)
BUN: 20 mg/dL (ref 8–23)
CO2: 24 mmol/L (ref 22–32)
Calcium: 8.8 mg/dL — ABNORMAL LOW (ref 8.9–10.3)
Chloride: 107 mmol/L (ref 98–111)
Creatinine: 0.99 mg/dL (ref 0.44–1.00)
GFR, Estimated: >60 mL/min (ref >60)
Glucose, Bld: 86 mg/dL (ref 70–99)
Potassium: 3.9 mmol/L (ref 3.5–5.1)
Sodium: 136 mmol/L (ref 135–145)
Total Bilirubin: 0.3 mg/dL (ref 0.0–1.2)
Total Protein: 5.4 g/dL — ABNORMAL LOW (ref 6.5–8.1)

## 2023-12-11 LAB — RAD ONC ARIA SESSION SUMMARY
Course Elapsed Days: 9
Plan Fractions Treated to Date: 7
Plan Prescribed Dose Per Fraction: 3 Gy
Plan Total Fractions Prescribed: 10
Plan Total Prescribed Dose: 30 Gy
Reference Point Dosage Given to Date: 21 Gy
Reference Point Session Dosage Given: 3 Gy
Session Number: 7

## 2023-12-11 LAB — SAMPLE TO BLOOD BANK

## 2023-12-11 MED ORDER — SODIUM CHLORIDE 0.9 % IV SOLN: Freq: Once | INTRAVENOUS | Status: AC

## 2023-12-11 MED ORDER — PROCHLORPERAZINE EDISYLATE 10 MG/2ML IJ SOLN
10.0000 mg | Freq: Once | INTRAMUSCULAR | Status: AC
  Administered 2023-12-11: 10 mg via INTRAVENOUS
  Filled 2023-12-11: qty 2

## 2023-12-11 NOTE — Telephone Encounter (Signed)
 Left the patient a voicemail informing her of the newly updated appointment details for 10/28 instead of 10/29.

## 2023-12-11 NOTE — Patient Instructions (Signed)
 Sodium Chloride  Solution What is this medication? SODIUM CHLORIDE  (SOE dee um KLOOR ide) prevents and treats low levels of sodium in your body. Sodium plays an important role in your hydration level and the health of your muscles and nervous system. This medicine may be used for other purposes; ask your health care provider or pharmacist if you have questions. What should I tell my care team before I take this medication? They need to know if you have any of these conditions: Heart disease High blood pressure High levels of sodium in the blood Kidney disease Liver disease Low salt or sodium diet An unusual or allergic reaction to any medications, foods, dyes, or preservatives Pregnant or trying to get pregnant Breast-feeding How should I use this medication? Take this medication by mouth. Take it as directed on the prescription label at the same time every day. You can take it with or without food. If it upsets your stomach, take it with food. You may mix this medication with water  or juice. Take the dose right away after mixing. If giving this medication to an infant, you may mix it with formula or breast milk. Give the dose right away after mixing. Use a specially marked oral syringe, spoon, or dropper to measure each dose. Ask your pharmacist if you do not have one. Household spoons are not accurate. Talk to your care team about the use of this medication in children. While it may be given to children for selected conditions, precautions do apply. Overdosage: If you think you have taken too much of this medicine contact a poison control center or emergency room at once. NOTE: This medicine is only for you. Do not share this medicine with others. What if I miss a dose? If you take this medication on a regular basis, take it as soon as you can. If it is almost time for your next dose, take only that dose. Do not take double or extra doses. What may interact with this  medication? Lithium Steroid medications, such as prednisone  or cortisone This list may not describe all possible interactions. Give your health care provider a list of all the medicines, herbs, non-prescription drugs, or dietary supplements you use. Also tell them if you smoke, drink alcohol, or use illegal drugs. Some items may interact with your medicine. What should I watch for while using this medication? Visit your care team for regular checks on your progress. Tell your care team if your symptoms do not start to get better or if they get worse. You may need bloodwork while taking this medication. What side effects may I notice from receiving this medication? Side effects that you should report to your care team as soon as possible: Allergic reactions--skin rash, itching, hives, swelling of the face, lips, tongue, or throat High sodium level--confusion, increased thirst, muscle weakness, unusual weakness or fatigue, twitching muscles Side effects that usually do not require medical attention (report these to your care team if they continue or are bothersome): Dizziness Fatigue Headache Nausea Vomiting This list may not describe all possible side effects. Call your doctor for medical advice about side effects. You may report side effects to FDA at 1-800-FDA-1088. Where should I keep my medication? Keep out of the reach of children and pets. Store at room temperature between 20 and 25 degrees C (68 and 77 degrees F). Get rid of any unused medication after it expires or 90 days after opening, whichever is first. To get rid of medications that are no  longer needed or have expired: Take the medication to a medication take-back program. Check with your pharmacy or law enforcement to find a location. If you cannot return the medication, check the label or package insert to see if the medication should be thrown out in the garbage or flushed down the toilet. If you are not sure, ask your care  team. If it is safe to put it in the trash, pour the medication out of the container. Mix the medication with cat litter, dirt, coffee grounds, or other unwanted substance. Seal the mixture in a bag or container. Put it in the trash. NOTE: This sheet is a summary. It may not cover all possible information. If you have questions about this medicine, talk to your doctor, pharmacist, or health care provider.  2024 Elsevier/Gold Standard (2020-12-15 00:00:00)

## 2023-12-12 ENCOUNTER — Other Ambulatory Visit: Payer: Self-pay

## 2023-12-12 ENCOUNTER — Ambulatory Visit
Admission: RE | Admit: 2023-12-12 | Discharge: 2023-12-12 | Disposition: A | Discharge status: Home / Self Care | Source: Ambulatory Visit | Attending: Radiation Oncology | Admitting: Radiation Oncology

## 2023-12-12 DIAGNOSIS — Z510 Encounter for antineoplastic radiation therapy: Secondary | ICD-10-CM | POA: Diagnosis not present

## 2023-12-12 LAB — RAD ONC ARIA SESSION SUMMARY
Course Elapsed Days: 10
Plan Fractions Treated to Date: 8
Plan Prescribed Dose Per Fraction: 3 Gy
Plan Total Fractions Prescribed: 10
Plan Total Prescribed Dose: 30 Gy
Reference Point Dosage Given to Date: 24 Gy
Reference Point Session Dosage Given: 3 Gy
Session Number: 8

## 2023-12-12 LAB — SURGICAL PATHOLOGY

## 2023-12-12 NOTE — Progress Notes (Unsigned)
 [facility] OFFICE PROGRESS NOTE  Sheryle Carwin, MD [address] [place] [address]  DIAGNOSIS: Stage IIIc (T3, N3, M0) non-small cell lung cancer, adenocarcinoma presented with large left upper lobe lung mass in addition to left infrahilar and right hilar and subcarinal lymphadenopathy diagnosed in August 2021.   Molecular Biomarkers: Insufficient tissue for foundation 1. Guardant 360 results.   DETECTED ALTERATION(S) / BIOMARKER(S)% CFDNA OR AMPLIFICATIONASSOCIATED FDA-APPROVED THERAPIESCLINICAL TRIAL AVAILABILITY   EGFR L858R, 1.2%, Afatinib, Dacomitinib, Erlotinib, Gefitinib, Osimertinib , Ramucirumab Yes [id number], 1.0%, Afatinib, Dacomitinib, Erlotinib, Gefitinib, Osimertinib , Ramucirumab Yes [id number] 1.0% None     No  PRIOR THERAPY: 1) Weekly concurrent chemoradiation with carboplatin  for an AUC of 2 and paclitaxel  45 mg/m2.  First dose expected on 11/16/2019. Status post 7 cycles.  2) Tagrisso  80 mg p.o. daily. First dose started 01/31/2020.  Status post 42 months of treatment.  She has been off treatment for 4 months based on her request and secondary to adverse effects but this was discontinued secondary to disease progression.  CURRENT THERAPY: Systemic chemotherapy with carboplatin  for AUC of 5, Alimta 500 mg/M2 and Tagrisso  80 mg p.o. daily. First dose 11/21/2023. Status post 1 cycle. Her dose of carboplatin  was reduced to an AUC of 4 and Alimta 400 mg/m2 starting from cycle #2 due to cytopenias.   INTERVAL HISTORY: Linda Woods 73 y.o. female returns to the clinic today for a follow-up visit accompanied by her friend. The patient was last seen by myself last week before cycle #2 of treatment.  However, treatment was canceled that day due to neutropenia.  She had also been having projectile vomiting and an episode of diarrhea the day prior.  Therefore, she received IV fluids and antiemetics instead last week which helped her symptoms.  Her red  blood cell count dropped from 12.5 to 8.4 after one round of treatment. She started  an iron supplement and is going to try to maintain a diet rich in green leafy vegetables. She does not eat red meat often due to GI upset. No bleeding, such as melena, epistaxis, or gum bleeding, has been noted. She had a UA 3 weeks ago performed by ID that was negative for blood. She has an upcoming appointment with urology to assess hydronephrosis. She has a history of kidney stones many years ago. She is not having any unusual symptoms at thist ime.   She experiences occasional diarrhea, which she manages by taking Tagrisso  at night. She had some diarrhea early this morning. The diarrhea is not daily and can be controlled with Imodium  when it occurs. She also takes hydrocodone  occasionally, which can cause constipation, but she does not exceed two tablets a day. Her husband is a teacher, early years/pre and she requested lomotil  prescription today.   Her blood pressure has been running low, with a recent reading of 95/61 for blood pressure. She denies history of arrhythmia  Nausea and vomiting have improved with fluids, and she has not lost any more weight since the last visit. She wears compression hose for swelling, which helps, but did not wear them today. No recent fevers, significant dyspnea, or chest discomfort, though she notes mild soreness on one side, which she attributes to minor coughing. She uses an inhaler in the morning, two puffs, and is not on anticoagulation therapy.  She feels healthy overall, with variable energy levels. Overall, despite the worsening anemia, she states she is feeling well at this time and denies worsening energy.    She  is here today for evaluation and repeat blood work before considering cycle #2. MEDICAL HISTORY: Past Medical History:  Diagnosis Date   Anemia    as a child   Anxiety    Asthma 10/19/2020   Bursitis    Chronic reflux esophagitis    Complication of anesthesia     Diarrhea, functional    Diverticulosis    GERD (gastroesophageal reflux disease)    History of kidney stones    Hypertension    Knee pain    right knee-seeing ortho   lung ca 09/2019   Osteoarthritis    Plantar fasciitis    Pneumonia    PONV (postoperative nausea and vomiting)    has used the patch before and that helps   Pure hypercholesterolemia    RLS (restless legs syndrome)    Superficial vein thrombosis    Thyroid  disease    hypothyroid    ALLERGIES:  has no active allergies.  MEDICATIONS:  Current Outpatient Medications  Medication Sig Dispense Refill   diphenoxylate -atropine  (LOMOTIL ) 2.5-0.025 MG tablet Take 1 tablet by mouth 4 (four) times daily as needed for diarrhea or loose stools. 30 tablet 0   albuterol  (VENTOLIN  HFA) 108 (90 Base) MCG/ACT inhaler Inhale 2 puffs into the lungs every 6 (six) hours as needed for wheezing or shortness of breath. 8.5 g 0   alum & mag hydroxide-simeth (MAALOX/MYLANTA) 200-200-20 MG/5ML suspension Take 15 mLs by mouth every 6 (six) hours as needed for indigestion or heartburn. 355 mL 0   Ascorbic Acid (VITAMIN C) 500 MG CAPS Take 500 mg by mouth in the morning and at bedtime. Take with methenamine 60 capsule 11   chlorhexidine  (HIBICLENS ) 4 % external liquid Apply 15 mLs (1 Application total) topically as directed for 30 doses. Use as directed daily for 5 days every other week for 6 weeks. 946 mL 1   Cholecalciferol (VITAMIN D ) 50 MCG (2000 UT) tablet Take 2,000 Units by mouth daily.     ciprofloxacin  (CIPRO ) 500 MG tablet Take 500 mg by mouth 2 (two) times daily.     dexamethasone  (DECADRON ) 4 MG tablet Take 1 tab 2 times daily starting day before pemetrexed. Then take 2 tabs daily x 3 days starting day after carboplatin . Take with food. 30 tablet 1   docusate sodium  (COLACE) 100 MG capsule Take 1 capsule (100 mg total) by mouth 2 (two) times daily. 60 capsule 2   famotidine  (PEPCID ) 40 MG tablet Take 40 mg by mouth at bedtime.      ferrous sulfate  325 (65 FE) MG tablet Take 325 mg by mouth daily with breakfast.     fexofenadine (ALLEGRA) 180 MG tablet Take 180 mg by mouth daily as needed for allergies.     fluticasone  (FLONASE ) 50 MCG/ACT nasal spray Place 1 spray into both nostrils as needed for allergies.     folic acid  (FOLVITE ) 1 MG tablet Take 1 tablet (1 mg total) by mouth daily. Start 7 days before pemetrexed chemotherapy. Continue until 21 days after pemetrexed completed. 100 tablet 3   HYDROcodone -acetaminophen  (NORCO/VICODIN) 5-325 MG tablet Take 1 tablet by mouth every 6 (six) hours as needed for moderate pain (pain score 4-6). 30 tablet 0   levothyroxine  (SYNTHROID ) 100 MCG tablet Take 1 tablet (100 mcg total) by mouth daily. One po qd 90 tablet 0   lidocaine  (LIDODERM ) 5 % Place 1 patch onto the skin daily.     lidocaine  (XYLOCAINE ) 2 % solution Use as directed 15 mLs in  the mouth or throat every 6 (six) hours as needed for mouth pain. 100 mL 1   lidocaine -prilocaine  (EMLA ) cream Apply to affected area once 30 g 3   LORazepam  (ATIVAN ) 0.5 MG tablet Take 0.5 mg by mouth at bedtime.      MAGNESIUM  PO Take 1 tablet by mouth daily.     melatonin 5 MG TABS Take 5 mg by mouth at bedtime as needed (sleep).     methenamine (HIPREX) 1 g tablet Take 1 tablet (1 g total) by mouth 2 (two) times daily with a meal. 60 tablet 11   metoprolol  succinate (TOPROL -XL) 50 MG 24 hr tablet Take 1 tablet (50 mg total) by mouth daily. 90 tablet 3   MYRBETRIQ  50 MG TB24 tablet Take 50 mg by mouth daily.     ondansetron  (ZOFRAN ) 4 MG tablet Take 1 tablet (4 mg total) by mouth every 6 (six) hours as needed for nausea. 20 tablet 0   osimertinib  mesylate (TAGRISSO ) 80 MG tablet Take 1 tablet (80 mg total) by mouth daily. 30 tablet 3   oxyCODONE  (OXY IR/ROXICODONE ) 5 MG immediate release tablet Take 5 mg by mouth every 6 (six) hours as needed for severe pain (pain score 7-10).     POTASSIUM PO Take 1 tablet by mouth daily.     PREMARIN  vaginal cream Place 1 applicator vaginally as needed (urinary issues).     prochlorperazine  (COMPAZINE ) 10 MG tablet Take 1 tablet (10 mg total) by mouth every 6 (six) hours as needed for nausea or vomiting. 30 tablet 1   RABEprazole  (ACIPHEX ) 20 MG tablet Take 1 tablet (20 mg total) by mouth 2 (two) times a week.     rosuvastatin  (CRESTOR ) 5 MG tablet Take 1 tablet (5 mg total) by mouth daily. 90 tablet 3   sodium chloride  1 g tablet Take 1 tablet (1 g total) by mouth 2 (two) times daily with a meal. 60 tablet 0   STIOLTO RESPIMAT  2.5-2.5 MCG/ACT AERS INHALE TWO PUFFS INTO THE LUNGS DAILY 4 g 5   triamcinolone  cream (KENALOG ) 0.1 % Apply 1 Application topically 2 (two) times daily. As needed for itching/rash 453.6 g 0   vitamin B-12 (CYANOCOBALAMIN ) 1000 MCG tablet Take 1,000 mcg by mouth daily.     No current facility-administered medications for this visit.   Facility-Administered Medications Ordered in Other Visits  Medication Dose Route Frequency Provider Last Rate Last Admin   0.9 %  sodium chloride  infusion   Intravenous Continuous Sherrod Sherrod, MD   Stopped at 12/17/23 1136   CARBOplatin  (PARAPLATIN ) 270 mg in sodium chloride  0.9 % 100 mL chemo infusion  270 mg Intravenous Once Sherrod Sherrod, MD       PEMEtrexed Disodium (ALIMTA) 600 mg in sodium chloride  0.9 % 100 mL chemo infusion  400 mg/m2 (Treatment Plan Recorded) Intravenous Once Sherrod Sherrod, MD        SURGICAL HISTORY:  Past Surgical History:  Procedure Laterality Date   APPENDECTOMY     BRONCHIAL BIOPSY  10/20/2019   Procedure: BRONCHIAL BIOPSIES;  Surgeon: Shelah Lamar RAMAN, MD;  Location: [facility] ENDOSCOPY;  Service: Pulmonary;;   BRONCHIAL BRUSHINGS  10/20/2019   Procedure: BRONCHIAL BRUSHINGS;  Surgeon: Shelah Lamar RAMAN, MD;  Location: [facility] ENDOSCOPY;  Service: Pulmonary;;   BRONCHIAL NEEDLE ASPIRATION BIOPSY  10/20/2019   Procedure: BRONCHIAL NEEDLE ASPIRATION BIOPSIES;  Surgeon: Shelah Lamar RAMAN, MD;  Location: [facility]  ENDOSCOPY;  Service: Pulmonary;;   BRONCHIAL WASHINGS  10/20/2019   Procedure:  BRONCHIAL WASHINGS;  Surgeon: Shelah Lamar RAMAN, MD;  Location: [facility] ENDOSCOPY;  Service: Pulmonary;;   CARPAL TUNNEL RELEASE Right 2011   Dr. Camella   CHOLECYSTECTOMY N/A    COLPORRHAPHY     posterior   HAND SURGERY Right 2016   Nerve surgery, Dr. Camella   IR KYPHO LUMBAR INC FX REDUCE BONE BX UNI/BIL CANNULATION INC/IMAGING  07/03/2023   OVARIAN CYST REMOVAL     RECTOCELE REPAIR  2011   w/TVH and sling   TONSILLECTOMY     TONSILLECTOMY     TOTAL KNEE ARTHROPLASTY Left 09/09/2018   Procedure: TOTAL KNEE ARTHROPLASTY, CORTISONE INJECTION RIGHT KNEE;  Surgeon: Ernie Cough, MD;  Location: [facility] ORS;  Service: Orthopedics;  Laterality: Left;  70 mins   TOTAL KNEE ARTHROPLASTY Right 04/16/2023   Procedure: TOTAL KNEE ARTHROPLASTY;  Surgeon: Ernie Cough, MD;  Location: [facility] ORS;  Service: Orthopedics;  Laterality: Right;   TOTAL VAGINAL HYSTERECTOMY  10/18/2009   rectocele repair, sling   TUBAL LIGATION Bilateral    VARICOSE VEIN SURGERY     VIDEO BRONCHOSCOPY WITH ENDOBRONCHIAL NAVIGATION N/A 10/20/2019   Procedure: VIDEO BRONCHOSCOPY WITH ENDOBRONCHIAL NAVIGATION;  Surgeon: Shelah Lamar RAMAN, MD;  Location: [facility] ENDOSCOPY;  Service: Pulmonary;  Laterality: N/A;    REVIEW OF SYSTEMS:   Review of Systems  Constitutional: Stable fatigue. Negative for appetite change, chills, fatigue, fever and unexpected weight change.  HENT: Negative for mouth sores, nosebleeds, sore throat and trouble swallowing.   Eyes: Negative for eye problems and icterus.  Respiratory: Mild/minimal occasional shortness of breath (does not exert herself frequently) and mild/minimal occasional cough. Negative for hemoptysis and wheezing.   Cardiovascular: Negative for chest pain and leg swelling.  Gastrointestinal: Occasional diarrhea. Negative for abdominal pain, constipation, nausea and vomiting.  Genitourinary: Negative for bladder  incontinence, difficulty urinating, dysuria, frequency and hematuria.   Musculoskeletal: Negative for back pain, gait problem, neck pain and neck stiffness.  Skin: Negative for itching and rash.  Neurological: Negative for dizziness, extremity weakness, gait problem, headaches, light-headedness and seizures.  Hematological: Negative for adenopathy. Does not bruise/bleed easily.  Psychiatric/Behavioral: Negative for confusion, depression and sleep disturbance. The patient is not nervous/anxious.     PHYSICAL EXAMINATION:  Blood pressure 95/61, pulse 93, temperature 97.9 F (36.6 C), temperature source Temporal, resp. rate 14, weight 120 lb (54.4 kg), last menstrual period 08/21/2002, SpO2 100%.  ECOG PERFORMANCE STATUS: 1  Physical Exam  Constitutional: Oriented to person, place, and time and thin appearing female and in no distress.  HENT:  Head: Normocephalic and atraumatic.  Mouth/Throat: Oropharynx is clear and moist. No oropharyngeal exudate.  Eyes: Conjunctivae are normal. Right eye exhibits no discharge. Left eye exhibits no discharge. No scleral icterus.  Neck: Normal range of motion. Neck supple.  Cardiovascular: Normal rate, regular rhythm, normal heart sounds and intact distal pulses.   Pulmonary/Chest: Effort normal and breath sounds normal. No respiratory distress. No wheezes. No rales.  Abdominal: Soft. Bowel sounds are normal. Exhibits no distension and no mass. There is no tenderness.  Musculoskeletal: Normal range of motion. Mild ankle swelling bilaterally.  Lymphadenopathy:    No cervical adenopathy.  Neurological: Alert and oriented to person, place, and time. Exhibits wasting.  She was examined in the wheelchair.  Skin: Skin is warm and dry. No rash noted. Not diaphoretic. No erythema. No pallor.  Psychiatric: Mood, memory and judgment normal.  Vitals reviewed.  LABORATORY DATA: Lab Results  Component Value Date   WBC 8.3 12/17/2023  HGB 8.2 (L) 12/17/2023    HCT 23.6 (L) 12/17/2023   MCV 92.5 12/17/2023   PLT 189 12/17/2023      Chemistry      Component Value Date/Time   NA 135 12/17/2023 0915   K 4.1 12/17/2023 0915   CL 103 12/17/2023 0915   CO2 23 12/17/2023 0915   BUN 18 12/17/2023 0915   CREATININE 1.08 (H) 12/17/2023 0915      Component Value Date/Time   CALCIUM  8.8 (L) 12/17/2023 0915   ALKPHOS 95 12/17/2023 0915   AST 11 (L) 12/17/2023 0915   ALT 16 12/17/2023 0915   BILITOT 0.2 12/17/2023 0915       RADIOGRAPHIC STUDIES:  CT BIOPSY Result Date: December 22, 2023 INDICATION: Metastatic lung cancer, PET positive left retroperitoneal periaortic adenopathy EXAM: CT CORE BIOPSY LEFT RETROPERITONEAL ADENOPATHY MEDICATIONS: 1% LIDOCAINE  LOCAL ANESTHESIA/SEDATION: Moderate (conscious) sedation was employed during this procedure. A total of Versed  1.5 mg and Fentanyl  50 mcg was administered intravenously by the radiology nurse. Also, 4 mg Zofran  administered IV Total intra-service moderate Sedation Time: 10 minutes. The patient's level of consciousness and vital signs were monitored continuously by radiology nursing throughout the procedure under my direct supervision. COMPLICATIONS: None immediate. PROCEDURE: Informed written consent was obtained from the patient after a thorough discussion of the procedural risks, benefits and alternatives. All questions were addressed. Maximal Sterile Barrier Technique was utilized including caps, mask, sterile gowns, sterile gloves, sterile drape, hand hygiene and skin antiseptic. A timeout was performed prior to the initiation of the procedure. Previous imaging reviewed. Patient positioned prone. Noncontrast localization CT performed. The left retroperitoneal adenopathy was localized and marked. This was correlated with the PET-CT. Under sterile conditions and local anesthesia, the 17 gauge 11.8 cm guide needle was advanced to the left retroperitoneal adenopathy. Needle position confirmed with CT. 18 gauge  core biopsies obtained through the access. Samples placed in formalin. Needle removed. Postprocedure imaging demonstrates no hemorrhage or hematoma. Patient tolerated biopsy well. IMPRESSION: Successful CT-guided 18 gauge core biopsy of the left retroperitoneal adenopathy Electronically Signed   By: CHRISTELLA.  Shick M.D.   On: 22-Dec-2023 11:28     ASSESSMENT/PLAN:  This is a very pleasant 73 years old white female recently diagnosed with a stage IIIc/IV (T3, N3, M0/M1a) non-small cell lung cancer, adenocarcinoma presented with large left upper lobe lung mass in addition to left infrahilar, right hilar and subcarinal lymphadenopathy and small left pleural effusion diagnosed in August 2021 with positive EGFR mutation in exon 21 (L858R).    The patient completed a course of concurrent chemoradiation with weekly carboplatin  and paclitaxel  status post 7 cycles.  She tolerated her treatment well except for fatigue as well as a skin burn and cough. Her scan showed mild improvement of the left upper lobe lung mass as well as the mediastinal lymphadenopathy.   The patient continued to have small left pleural effusion that is suspicious given her presentation.   She then was on Tagrisso  80 mg p.o. daily started on January 31, 2020. She is status post 42 months of treatment. Her treatment was on hold par patient request and fatigue.    She also underwent kyphoplasty of L2 vertebral lesion and the biopsy was negative for malignancy.    She then had a PET scan performed on  which showed clear evidence for disease progression especially with bone metastasis in addition to increasing activity in the left hilar area and suspicious left axillary lymphadenopathy.    She is currently undergoing  systemic chemotherapy with carboplatin  for an AUC of 5 and Alimta 500 mg/m2 in addition to resuming treatment with Tagrisso  80 mg p.o. daily.  She is status post her first week of treatment.   She had a repeat biopsy recently. Dr.  Sherrod reviewed this which showed adenocarcinoma (consistent with her prior malignancy). We will send this off for re-evaluation to see if she developed any resistant mutations.   Reviewed her progressive anemia with Dr. Sherrod. Dr. Sherrod will reduce her dose of carboplatin  to an AUC of 4 and Alimta to 400 mg/m2. She will proceed with treatment as scheduled. Her creatinine is 1.08 and GFR 55.   We will need to monitor her labs closely on a weekly basis. She denies visible bleeding. I suspect she may require a blood transfusion next week. I will request holding a infusion appointment for blood.    I have standing orders for sample blood bank should he have worsening anemia.  We would consider her for blood transfusion if her hemoglobin were less than 8. We will monitor her labs on a weekly basis. I also ordered ABO/RH for next week.    She will continue Tagrisso  for now.   She will take Imodium  if needed for diarrhea. Per patient request, she requested lomotil . It sounds like her imodium  is controlling her diarrhea and she is not taking this very frequently.   Hypotension Low blood pressure possibly due to metoprolol  and anemia.  Advised to monitor at home. - Monitor blood pressure at home and log readings and share with cardiologist. - Discuss metoprolol  dose with cardiologist if blood pressure remains low - She will receive 1/2 L IVF with infusion today for her hypotension.   - Continue iron supplementation. - Encourage dietary intake of iron-rich foods. - Provide stool cards for gastrointestinal bleeding.  She will continue with elevation and compression stockings for her legs    Will see her back in 3 weeks for evaluation and repeat blood work.    The patient was advised to call immediately if she has any concerning symptoms in the interval. The patient voices understanding of current disease status and treatment options and is in agreement with the current care plan. All  questions were answered. The patient knows to call the clinic with any problems, questions or concerns. We can certainly see the patient much sooner if necessary    Orders Placed This Encounter  Procedures   CBC with Differential (Cancer Center Only)    Standing Status:   Future    Expected Date:   05/13/2024    Expiration Date:   05/13/2025   CMP (Cancer Center only)    Standing Status:   Future    Expected Date:   05/13/2024    Expiration Date:   05/13/2025   Occult blood card to lab, stool    Standing Status:   Future    Expected Date:   12/24/2023    Expiration Date:   12/16/2024   Occult blood card to lab, stool    Standing Status:   Future    Expected Date:   12/24/2023    Expiration Date:   12/16/2024   Occult blood card to lab, stool    Standing Status:   Future    Expected Date:   12/24/2023    Expiration Date:   12/16/2024   Iron and Iron Binding Capacity ([facility] only)    Standing Status:   Future    Expected Date:   12/24/2023  Expiration Date:   12/16/2024   Ferritin    Standing Status:   Future    Expected Date:   12/24/2023    Expiration Date:   12/16/2024   ABO/Rh    Standing Status:   Future    Expected Date:   12/24/2023    Expiration Date:   12/16/2024   Sample to Blood Bank    Standing Status:   Standing    Number of Occurrences:   10    Next Expected Occurrence:   12/20/2023    Expiration Date:   12/16/2024     I spent {CHL ONC TIME VISIT - DTPQU:[id number]} counseling the patient face to face. The total time spent in the appointment was {CHL ONC TIME VISIT - DTPQU:[id number]}.  Roshunda Keir L Cohan Stipes, PA-C 12/17/23

## 2023-12-13 ENCOUNTER — Ambulatory Visit
Admission: RE | Admit: 2023-12-13 | Discharge: 2023-12-13 | Disposition: A | Discharge status: Home / Self Care | Source: Ambulatory Visit | Attending: Radiation Oncology | Admitting: Radiation Oncology

## 2023-12-13 ENCOUNTER — Other Ambulatory Visit (HOSPITAL_COMMUNITY): Payer: Self-pay

## 2023-12-13 ENCOUNTER — Other Ambulatory Visit: Payer: Self-pay

## 2023-12-13 DIAGNOSIS — Z510 Encounter for antineoplastic radiation therapy: Secondary | ICD-10-CM | POA: Diagnosis not present

## 2023-12-13 LAB — RAD ONC ARIA SESSION SUMMARY
Course Elapsed Days: 11
Plan Fractions Treated to Date: 9
Plan Prescribed Dose Per Fraction: 3 Gy
Plan Total Fractions Prescribed: 10
Plan Total Prescribed Dose: 30 Gy
Reference Point Dosage Given to Date: 27 Gy
Reference Point Session Dosage Given: 3 Gy
Session Number: 9

## 2023-12-16 ENCOUNTER — Other Ambulatory Visit: Payer: Self-pay

## 2023-12-16 ENCOUNTER — Ambulatory Visit
Admission: RE | Admit: 2023-12-16 | Discharge: 2023-12-16 | Disposition: A | Source: Ambulatory Visit | Attending: Radiation Oncology | Admitting: Radiation Oncology

## 2023-12-16 DIAGNOSIS — Z510 Encounter for antineoplastic radiation therapy: Secondary | ICD-10-CM | POA: Diagnosis not present

## 2023-12-16 LAB — RAD ONC ARIA SESSION SUMMARY
Course Elapsed Days: 14
Plan Fractions Treated to Date: 10
Plan Prescribed Dose Per Fraction: 3 Gy
Plan Total Fractions Prescribed: 10
Plan Total Prescribed Dose: 30 Gy
Reference Point Dosage Given to Date: 30 Gy
Reference Point Session Dosage Given: 3 Gy
Session Number: 10

## 2023-12-16 MED FILL — Fosaprepitant Dimeglumine For IV Infusion 150 MG (Base Eq): INTRAVENOUS | Qty: 5 | Status: AC

## 2023-12-17 ENCOUNTER — Inpatient Hospital Stay

## 2023-12-17 ENCOUNTER — Other Ambulatory Visit: Payer: Self-pay | Admitting: Physician Assistant

## 2023-12-17 ENCOUNTER — Inpatient Hospital Stay: Admitting: Physician Assistant

## 2023-12-17 VITALS — BP 95/61 | HR 93 | Temp 97.9°F | Resp 14 | Wt 120.0 lb

## 2023-12-17 DIAGNOSIS — C3492 Malignant neoplasm of unspecified part of left bronchus or lung: Secondary | ICD-10-CM

## 2023-12-17 DIAGNOSIS — D6481 Anemia due to antineoplastic chemotherapy: Secondary | ICD-10-CM

## 2023-12-17 DIAGNOSIS — I959 Hypotension, unspecified: Secondary | ICD-10-CM

## 2023-12-17 DIAGNOSIS — R197 Diarrhea, unspecified: Secondary | ICD-10-CM

## 2023-12-17 DIAGNOSIS — C3490 Malignant neoplasm of unspecified part of unspecified bronchus or lung: Secondary | ICD-10-CM | POA: Insufficient documentation

## 2023-12-17 DIAGNOSIS — Z5111 Encounter for antineoplastic chemotherapy: Secondary | ICD-10-CM

## 2023-12-17 LAB — CBC WITH DIFFERENTIAL (CANCER CENTER ONLY)
Abs Immature Granulocytes: 0.24 K/uL — ABNORMAL HIGH (ref 0.00–0.07)
Basophils Absolute: 0 K/uL (ref 0.0–0.1)
Basophils Relative: 0 %
Eosinophils Absolute: 0 K/uL (ref 0.0–0.5)
Eosinophils Relative: 0 %
HCT: 23.6 % — ABNORMAL LOW (ref 36.0–46.0)
Hemoglobin: 8.2 g/dL — ABNORMAL LOW (ref 12.0–15.0)
Immature Granulocytes: 3 %
Lymphocytes Relative: 3 %
Lymphs Abs: 0.2 K/uL — ABNORMAL LOW (ref 0.7–4.0)
MCH: 32.2 pg (ref 26.0–34.0)
MCHC: 34.7 g/dL (ref 30.0–36.0)
MCV: 92.5 fL (ref 80.0–100.0)
Monocytes Absolute: 0.4 K/uL (ref 0.1–1.0)
Monocytes Relative: 5 %
Neutro Abs: 7.4 K/uL (ref 1.7–7.7)
Neutrophils Relative %: 89 %
Platelet Count: 189 K/uL (ref 150–400)
RBC: 2.55 MIL/uL — ABNORMAL LOW (ref 3.87–5.11)
RDW: 14.5 % (ref 11.5–15.5)
WBC Count: 8.3 K/uL (ref 4.0–10.5)
nRBC: 0 % (ref 0.0–0.2)

## 2023-12-17 LAB — CMP (CANCER CENTER ONLY)
ALT: 16 U/L (ref 0–44)
AST: 11 U/L — ABNORMAL LOW (ref 15–41)
Albumin: 3.3 g/dL — ABNORMAL LOW (ref 3.5–5.0)
Alkaline Phosphatase: 95 U/L (ref 38–126)
Anion gap: 9 (ref 5–15)
BUN: 18 mg/dL (ref 8–23)
CO2: 23 mmol/L (ref 22–32)
Calcium: 8.8 mg/dL — ABNORMAL LOW (ref 8.9–10.3)
Chloride: 103 mmol/L (ref 98–111)
Creatinine: 1.08 mg/dL — ABNORMAL HIGH (ref 0.44–1.00)
GFR, Estimated: 55 mL/min — ABNORMAL LOW (ref >60)
Glucose, Bld: 185 mg/dL — ABNORMAL HIGH (ref 70–99)
Potassium: 4.1 mmol/L (ref 3.5–5.1)
Sodium: 135 mmol/L (ref 135–145)
Total Bilirubin: 0.2 mg/dL (ref 0.0–1.2)
Total Protein: 5.6 g/dL — ABNORMAL LOW (ref 6.5–8.1)

## 2023-12-17 LAB — SAMPLE TO BLOOD BANK

## 2023-12-17 MED ORDER — SODIUM CHLORIDE 0.9 % IV SOLN: INTRAVENOUS | Status: DC

## 2023-12-17 MED ORDER — SODIUM CHLORIDE 0.9 % IV SOLN
400.0000 mg/m2 | Freq: Once | INTRAVENOUS | Status: AC
  Administered 2023-12-17: 600 mg via INTRAVENOUS
  Filled 2023-12-17: qty 20

## 2023-12-17 MED ORDER — PALONOSETRON HCL INJECTION 0.25 MG/5ML
0.2500 mg | Freq: Once | INTRAVENOUS | Status: AC
  Administered 2023-12-17: 0.25 mg via INTRAVENOUS
  Filled 2023-12-17: qty 5

## 2023-12-17 MED ORDER — SODIUM CHLORIDE 0.9 % IV SOLN
150.0000 mg | Freq: Once | INTRAVENOUS | Status: AC
  Administered 2023-12-17: 150 mg via INTRAVENOUS
  Filled 2023-12-17: qty 150
  Filled 2023-12-17: qty 5

## 2023-12-17 MED ORDER — LOPERAMIDE HCL 2 MG PO CAPS
2.0000 mg | ORAL_CAPSULE | Freq: Once | ORAL | Status: AC
  Administered 2023-12-17: 2 mg via ORAL
  Filled 2023-12-17: qty 1

## 2023-12-17 MED ORDER — SODIUM CHLORIDE 0.9 % IV SOLN: Freq: Once | INTRAVENOUS | Status: AC

## 2023-12-17 MED ORDER — DIPHENOXYLATE-ATROPINE 2.5-0.025 MG PO TABS: 1.0000 | ORAL_TABLET | Freq: Four times a day (QID) | ORAL | 0 refills | Status: AC | PRN

## 2023-12-17 MED ORDER — SODIUM CHLORIDE 0.9 % IV SOLN
272.8000 mg | Freq: Once | INTRAVENOUS | Status: AC
  Administered 2023-12-17: 270 mg via INTRAVENOUS
  Filled 2023-12-17: qty 27

## 2023-12-17 MED ORDER — DEXAMETHASONE SOD PHOSPHATE PF 10 MG/ML IJ SOLN
10.0000 mg | Freq: Once | INTRAMUSCULAR | Status: AC
  Administered 2023-12-17: 10 mg via INTRAVENOUS

## 2023-12-17 NOTE — Progress Notes (Signed)
 Request for tissue specimen [phone] be sent to [facility] for molecular testing emailed to [employer] and Tonya Pendell.

## 2023-12-17 NOTE — Patient Instructions (Signed)
 [facility] - A DEPT OF [facility]  Discharge Instructions: Thank you for choosing [facility] to provide your oncology and hematology care.   If you have a lab appointment with the Cancer Center, please go directly to the Cancer Center and check in at the registration area.   Wear comfortable clothing and clothing appropriate for easy access to any Portacath or PICC line.   We strive to give you quality time with your provider. You may need to reschedule your appointment if you arrive late (15 or more minutes).  Arriving late affects you and other patients whose appointments are after yours.  Also, if you miss three or more appointments without notifying the office, you may be dismissed from the clinic at the provider's discretion.      For prescription refill requests, have your pharmacy contact our office and allow 72 hours for refills to be completed.    Today you received the following chemotherapy and/or immunotherapy agents alimta  and carboplatin       To help prevent nausea and vomiting after your treatment, we encourage you to take your nausea medication as directed.  BELOW ARE SYMPTOMS THAT SHOULD BE REPORTED IMMEDIATELY: *FEVER GREATER THAN 100.4 F (38 C) OR HIGHER *CHILLS OR SWEATING *NAUSEA AND VOMITING THAT IS NOT CONTROLLED WITH YOUR NAUSEA MEDICATION *UNUSUAL SHORTNESS OF BREATH *UNUSUAL BRUISING OR BLEEDING *URINARY PROBLEMS (pain or burning when urinating, or frequent urination) *BOWEL PROBLEMS (unusual diarrhea, constipation, pain near the anus) TENDERNESS IN MOUTH AND THROAT WITH OR WITHOUT PRESENCE OF ULCERS (sore throat, sores in mouth, or a toothache) UNUSUAL RASH, SWELLING OR PAIN  UNUSUAL VAGINAL DISCHARGE OR ITCHING   Items with * indicate a potential emergency and should be followed up as soon as possible or go to the Emergency Department if any problems should occur.  Please show the CHEMOTHERAPY ALERT CARD or  IMMUNOTHERAPY ALERT CARD at check-in to the Emergency Department and triage nurse.  Should you have questions after your visit or need to cancel or reschedule your appointment, please contact [facility] - A DEPT OF [facility]  Dept: [phone]  and follow the prompts.  Office hours are 8:00 a.m. to 4:30 p.m. Monday - Friday. Please note that voicemails left after 4:00 p.m. may not be returned until the following business day.  We are closed weekends and major holidays. You have access to a nurse at all times for urgent questions. Please call the main number to the clinic Dept: [phone] and follow the prompts.   For any non-urgent questions, you may also contact your provider using MyChart. We now offer e-Visits for anyone 73 and older to request care online for non-urgent symptoms. For details visit [email or web address].   Also download the MyChart app! Go to the app store, search MyChart, open the app, select [facility], and log in with your MyChart username and password.

## 2023-12-18 ENCOUNTER — Inpatient Hospital Stay

## 2023-12-18 ENCOUNTER — Encounter: Payer: Self-pay | Admitting: Internal Medicine

## 2023-12-18 ENCOUNTER — Telehealth: Payer: Self-pay | Admitting: Physician Assistant

## 2023-12-18 NOTE — Radiation Completion Notes (Addendum)
  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 She developed nausea and vomiting which she received IV fluids for. This was attributed to her recent medication given to her by infectious disease. She noted improvement in her lower back pain with her first 7 radiation treatments and ambulating better as well.   The patient will return in one month and will continue follow up with Dr. Sherrod as well.      Ronita Due, PA-C

## 2023-12-19 ENCOUNTER — Inpatient Hospital Stay

## 2023-12-22 ENCOUNTER — Emergency Department (HOSPITAL_COMMUNITY)

## 2023-12-22 ENCOUNTER — Encounter (HOSPITAL_COMMUNITY): Payer: Self-pay | Admitting: *Deleted

## 2023-12-22 ENCOUNTER — Encounter: Payer: Self-pay | Admitting: Internal Medicine

## 2023-12-22 ENCOUNTER — Other Ambulatory Visit: Payer: Self-pay

## 2023-12-22 ENCOUNTER — Inpatient Hospital Stay (HOSPITAL_COMMUNITY)
Admission: EM | Admit: 2023-12-22 | Discharge: 2023-12-27 | DRG: 871 | Disposition: A | Discharge status: Home / Self Care | Attending: Internal Medicine | Admitting: Internal Medicine

## 2023-12-22 DIAGNOSIS — I10 Essential (primary) hypertension: Secondary | ICD-10-CM | POA: Diagnosis present

## 2023-12-22 DIAGNOSIS — Z8249 Family history of ischemic heart disease and other diseases of the circulatory system: Secondary | ICD-10-CM

## 2023-12-22 DIAGNOSIS — J4489 Other specified chronic obstructive pulmonary disease: Secondary | ICD-10-CM | POA: Diagnosis present

## 2023-12-22 DIAGNOSIS — E782 Mixed hyperlipidemia: Secondary | ICD-10-CM | POA: Diagnosis present

## 2023-12-22 DIAGNOSIS — G2581 Restless legs syndrome: Secondary | ICD-10-CM | POA: Diagnosis present

## 2023-12-22 DIAGNOSIS — E876 Hypokalemia: Secondary | ICD-10-CM | POA: Diagnosis present

## 2023-12-22 DIAGNOSIS — G8929 Other chronic pain: Secondary | ICD-10-CM | POA: Diagnosis present

## 2023-12-22 DIAGNOSIS — E8809 Other disorders of plasma-protein metabolism, not elsewhere classified: Secondary | ICD-10-CM | POA: Diagnosis present

## 2023-12-22 DIAGNOSIS — D61810 Antineoplastic chemotherapy induced pancytopenia: Secondary | ICD-10-CM | POA: Diagnosis present

## 2023-12-22 DIAGNOSIS — T451X5A Adverse effect of antineoplastic and immunosuppressive drugs, initial encounter: Secondary | ICD-10-CM | POA: Diagnosis present

## 2023-12-22 DIAGNOSIS — Z79890 Hormone replacement therapy (postmenopausal): Secondary | ICD-10-CM | POA: Diagnosis not present

## 2023-12-22 DIAGNOSIS — E43 Unspecified severe protein-calorie malnutrition: Secondary | ICD-10-CM | POA: Diagnosis present

## 2023-12-22 DIAGNOSIS — Z87442 Personal history of urinary calculi: Secondary | ICD-10-CM

## 2023-12-22 DIAGNOSIS — A414 Sepsis due to anaerobes: Secondary | ICD-10-CM | POA: Diagnosis present

## 2023-12-22 DIAGNOSIS — D61818 Other pancytopenia: Secondary | ICD-10-CM | POA: Diagnosis not present

## 2023-12-22 DIAGNOSIS — Z803 Family history of malignant neoplasm of breast: Secondary | ICD-10-CM

## 2023-12-22 DIAGNOSIS — Z90710 Acquired absence of both cervix and uterus: Secondary | ICD-10-CM | POA: Diagnosis not present

## 2023-12-22 DIAGNOSIS — Z96652 Presence of left artificial knee joint: Secondary | ICD-10-CM

## 2023-12-22 DIAGNOSIS — K529 Noninfective gastroenteritis and colitis, unspecified: Secondary | ICD-10-CM | POA: Diagnosis present

## 2023-12-22 DIAGNOSIS — Z8049 Family history of malignant neoplasm of other genital organs: Secondary | ICD-10-CM | POA: Diagnosis not present

## 2023-12-22 DIAGNOSIS — E871 Hypo-osmolality and hyponatremia: Secondary | ICD-10-CM | POA: Diagnosis present

## 2023-12-22 DIAGNOSIS — Z833 Family history of diabetes mellitus: Secondary | ICD-10-CM | POA: Diagnosis not present

## 2023-12-22 DIAGNOSIS — F419 Anxiety disorder, unspecified: Secondary | ICD-10-CM | POA: Diagnosis present

## 2023-12-22 DIAGNOSIS — E781 Pure hyperglyceridemia: Secondary | ICD-10-CM | POA: Diagnosis present

## 2023-12-22 DIAGNOSIS — K219 Gastro-esophageal reflux disease without esophagitis: Secondary | ICD-10-CM | POA: Diagnosis present

## 2023-12-22 DIAGNOSIS — Z6828 Body mass index (BMI) 28.0-28.9, adult: Secondary | ICD-10-CM

## 2023-12-22 DIAGNOSIS — I959 Hypotension, unspecified: Secondary | ICD-10-CM | POA: Diagnosis present

## 2023-12-22 DIAGNOSIS — Z79899 Other long term (current) drug therapy: Secondary | ICD-10-CM

## 2023-12-22 DIAGNOSIS — C3492 Malignant neoplasm of unspecified part of left bronchus or lung: Secondary | ICD-10-CM | POA: Diagnosis present

## 2023-12-22 DIAGNOSIS — E039 Hypothyroidism, unspecified: Secondary | ICD-10-CM | POA: Diagnosis present

## 2023-12-22 DIAGNOSIS — D701 Agranulocytosis secondary to cancer chemotherapy: Secondary | ICD-10-CM | POA: Diagnosis not present

## 2023-12-22 DIAGNOSIS — A0472 Enterocolitis due to Clostridium difficile, not specified as recurrent: Secondary | ICD-10-CM | POA: Diagnosis present

## 2023-12-22 DIAGNOSIS — Z96653 Presence of artificial knee joint, bilateral: Secondary | ICD-10-CM | POA: Diagnosis present

## 2023-12-22 DIAGNOSIS — R197 Diarrhea, unspecified: Principal | ICD-10-CM

## 2023-12-22 LAB — CBC WITH DIFFERENTIAL/PLATELET
Abs Immature Granulocytes: 0.02 K/uL (ref 0.00–0.07)
Basophils Absolute: 0 K/uL (ref 0.0–0.1)
Basophils Relative: 0 %
Eosinophils Absolute: 0 K/uL (ref 0.0–0.5)
Eosinophils Relative: 9 %
HCT: 28.6 % — ABNORMAL LOW (ref 36.0–46.0)
Hemoglobin: 9.6 g/dL — ABNORMAL LOW (ref 12.0–15.0)
Immature Granulocytes: 5 %
Lymphocytes Relative: 21 %
Lymphs Abs: 0.1 K/uL — ABNORMAL LOW (ref 0.7–4.0)
MCH: 31.9 pg (ref 26.0–34.0)
MCHC: 33.6 g/dL (ref 30.0–36.0)
MCV: 95 fL (ref 80.0–100.0)
Monocytes Absolute: 0 K/uL — ABNORMAL LOW (ref 0.1–1.0)
Monocytes Relative: 0 %
Neutro Abs: 0.3 K/uL — CL (ref 1.7–7.7)
Neutrophils Relative %: 65 %
Platelets: 94 K/uL — ABNORMAL LOW (ref 150–400)
RBC: 3.01 MIL/uL — ABNORMAL LOW (ref 3.87–5.11)
RDW: 14.6 % (ref 11.5–15.5)
Smear Review: NORMAL
WBC: 0.4 K/uL — CL (ref 4.0–10.5)
nRBC: 0 % (ref 0.0–0.2)

## 2023-12-22 LAB — CBC
HCT: 22.7 % — ABNORMAL LOW (ref 36.0–46.0)
Hemoglobin: 7.6 g/dL — ABNORMAL LOW (ref 12.0–15.0)
MCH: 31.8 pg (ref 26.0–34.0)
MCHC: 33.5 g/dL (ref 30.0–36.0)
MCV: 95 fL (ref 80.0–100.0)
Platelets: 66 K/uL — ABNORMAL LOW (ref 150–400)
RBC: 2.39 MIL/uL — ABNORMAL LOW (ref 3.87–5.11)
RDW: 14.6 % (ref 11.5–15.5)
WBC: 0.5 K/uL — CL (ref 4.0–10.5)
nRBC: 0 % (ref 0.0–0.2)

## 2023-12-22 LAB — I-STAT CHEM 8, ED
BUN: 20 mg/dL (ref 8–23)
Calcium, Ion: 1.17 mmol/L (ref 1.15–1.40)
Chloride: 100 mmol/L (ref 98–111)
Creatinine, Ser: 0.9 mg/dL (ref 0.44–1.00)
Glucose, Bld: 100 mg/dL — ABNORMAL HIGH (ref 70–99)
HCT: 26 % — ABNORMAL LOW (ref 36.0–46.0)
Hemoglobin: 8.8 g/dL — ABNORMAL LOW (ref 12.0–15.0)
Potassium: 3.7 mmol/L (ref 3.5–5.1)
Sodium: 135 mmol/L (ref 135–145)
TCO2: 23 mmol/L (ref 22–32)

## 2023-12-22 LAB — COMPREHENSIVE METABOLIC PANEL WITH GFR
ALT: 13 U/L (ref 0–44)
AST: 16 U/L (ref 15–41)
Albumin: 2.7 g/dL — ABNORMAL LOW (ref 3.5–5.0)
Alkaline Phosphatase: 68 U/L (ref 38–126)
Anion gap: 12 (ref 5–15)
BUN: 19 mg/dL (ref 8–23)
CO2: 21 mmol/L — ABNORMAL LOW (ref 22–32)
Calcium: 8.1 mg/dL — ABNORMAL LOW (ref 8.9–10.3)
Chloride: 101 mmol/L (ref 98–111)
Creatinine, Ser: 0.83 mg/dL (ref 0.44–1.00)
GFR, Estimated: >60 mL/min (ref >60)
Glucose, Bld: 102 mg/dL — ABNORMAL HIGH (ref 70–99)
Potassium: 3.6 mmol/L (ref 3.5–5.1)
Sodium: 134 mmol/L — ABNORMAL LOW (ref 135–145)
Total Bilirubin: 0.7 mg/dL (ref 0.0–1.2)
Total Protein: 5.2 g/dL — ABNORMAL LOW (ref 6.5–8.1)

## 2023-12-22 LAB — CREATININE, SERUM
Creatinine, Ser: 0.81 mg/dL (ref 0.44–1.00)
GFR, Estimated: >60 mL/min (ref >60)

## 2023-12-22 LAB — PROTIME-INR
INR: 1.1 (ref 0.8–1.2)
Prothrombin Time: 15.2 s (ref 11.4–15.2)

## 2023-12-22 LAB — I-STAT CG4 LACTIC ACID, ED: Lactic Acid, Venous: 0.7 mmol/L (ref 0.5–1.9)

## 2023-12-22 LAB — MAGNESIUM: Magnesium: 1.7 mg/dL (ref 1.7–2.4)

## 2023-12-22 MED ORDER — DEXTROSE IN LACTATED RINGERS 5 % IV SOLN
INTRAVENOUS | Status: AC
Start: 2023-12-22 — End: 2023-12-23

## 2023-12-22 MED ORDER — LACTATED RINGERS IV SOLN: INTRAVENOUS | Status: DC

## 2023-12-22 MED ORDER — OXYCODONE HCL 5 MG PO TABS
5.0000 mg | ORAL_TABLET | Freq: Once | ORAL | Status: AC
  Administered 2023-12-22: 5 mg via ORAL
  Filled 2023-12-22: qty 1

## 2023-12-22 MED ORDER — METRONIDAZOLE 500 MG/100ML IV SOLN
500.0000 mg | Freq: Two times a day (BID) | INTRAVENOUS | Status: DC
  Administered 2023-12-22: 500 mg via INTRAVENOUS
  Filled 2023-12-22: qty 100

## 2023-12-22 MED ORDER — SODIUM CHLORIDE 0.9 % IV BOLUS
500.0000 mL | Freq: Once | INTRAVENOUS | Status: AC
  Administered 2023-12-22: 500 mL via INTRAVENOUS

## 2023-12-22 MED ORDER — SODIUM CHLORIDE 0.9 % IV SOLN
2.0000 g | Freq: Once | INTRAVENOUS | Status: AC
  Administered 2023-12-22: 2 g via INTRAVENOUS
  Filled 2023-12-22: qty 12.5

## 2023-12-22 MED ORDER — IOHEXOL 350 MG/ML SOLN
75.0000 mL | Freq: Once | INTRAVENOUS | Status: AC | PRN
  Administered 2023-12-22: 75 mL via INTRAVENOUS

## 2023-12-22 MED ORDER — OXYCODONE HCL 5 MG PO TABS
5.0000 mg | ORAL_TABLET | Freq: Four times a day (QID) | ORAL | Status: DC | PRN
  Administered 2023-12-23 – 2023-12-27 (×11): 5 mg via ORAL
  Filled 2023-12-22 (×11): qty 1

## 2023-12-22 MED ORDER — OSIMERTINIB MESYLATE 80 MG PO TABS
80.0000 mg | ORAL_TABLET | Freq: Every day | ORAL | Status: DC
  Administered 2023-12-22: 80 mg via ORAL

## 2023-12-22 MED ORDER — SODIUM CHLORIDE 0.9 % IV SOLN
2.0000 g | INTRAVENOUS | Status: DC
  Administered 2023-12-22: 2 g via INTRAVENOUS
  Filled 2023-12-22: qty 20

## 2023-12-22 MED ORDER — ENOXAPARIN SODIUM 40 MG/0.4ML IJ SOSY
40.0000 mg | PREFILLED_SYRINGE | INTRAMUSCULAR | Status: DC
  Administered 2023-12-22: 40 mg via SUBCUTANEOUS
  Filled 2023-12-22: qty 0.4

## 2023-12-22 MED ORDER — ENSURE PLUS HIGH PROTEIN PO LIQD
237.0000 mL | Freq: Two times a day (BID) | ORAL | Status: DC
  Administered 2023-12-23 – 2023-12-27 (×6): 237 mL via ORAL

## 2023-12-22 MED ORDER — PROCHLORPERAZINE MALEATE 10 MG PO TABS
10.0000 mg | ORAL_TABLET | Freq: Four times a day (QID) | ORAL | Status: DC | PRN
  Administered 2023-12-22: 10 mg via ORAL
  Filled 2023-12-22 (×2): qty 2

## 2023-12-22 MED ORDER — SODIUM CHLORIDE 0.9 % IV BOLUS
1000.0000 mL | Freq: Once | INTRAVENOUS | Status: AC
  Administered 2023-12-22: 1000 mL via INTRAVENOUS

## 2023-12-22 MED ORDER — METHENAMINE HIPPURATE 1 G PO TABS: 1.0000 g | ORAL_TABLET | Freq: Two times a day (BID) | ORAL | Status: DC

## 2023-12-22 MED ORDER — METHENAMINE MANDELATE 0.5 G PO TABS
1000.0000 mg | ORAL_TABLET | Freq: Two times a day (BID) | ORAL | Status: DC
  Filled 2023-12-22: qty 2

## 2023-12-22 MED ORDER — METOPROLOL SUCCINATE ER 25 MG PO TB24
50.0000 mg | ORAL_TABLET | Freq: Every day | ORAL | Status: DC
  Administered 2023-12-22: 50 mg via ORAL
  Filled 2023-12-22: qty 2

## 2023-12-22 MED ORDER — ONDANSETRON HCL 4 MG/2ML IJ SOLN
4.0000 mg | Freq: Once | INTRAMUSCULAR | Status: AC
  Administered 2023-12-22: 4 mg via INTRAVENOUS
  Filled 2023-12-22: qty 2

## 2023-12-22 MED ORDER — ONDANSETRON HCL 4 MG/2ML IJ SOLN
4.0000 mg | Freq: Four times a day (QID) | INTRAMUSCULAR | Status: DC | PRN
  Filled 2023-12-22: qty 2

## 2023-12-22 MED ORDER — VANCOMYCIN HCL IN DEXTROSE 1-5 GM/200ML-% IV SOLN
1000.0000 mg | Freq: Once | INTRAVENOUS | Status: AC
  Administered 2023-12-22: 1000 mg via INTRAVENOUS
  Filled 2023-12-22: qty 200

## 2023-12-22 MED ORDER — METRONIDAZOLE 500 MG/100ML IV SOLN
500.0000 mg | Freq: Once | INTRAVENOUS | Status: AC
  Administered 2023-12-22: 500 mg via INTRAVENOUS
  Filled 2023-12-22: qty 100

## 2023-12-22 NOTE — H&P (Signed)
 History and Physical    Patient: Linda Woods FMW:[id number] DOB: [birth year] DOA: 12/22/2023 DOS: the patient was seen and examined on 12/22/2023 PCP: Sheryle Carwin, MD  Patient coming from: Home  Chief Complaint:  Chief Complaint  Patient presents with   Diarrhea   HPI: Linda Woods is a 73 y.o. female with medical history significant of non-small cell lung cancer stage IIIc on chemotherapy and radiation therapy with last chemotherapy 5 days ago and radiation about a week ago, hypothyroidism, GERD, essential hypertension, hyperlipidemia, who presents with diarrhea and nausea which has been going on for the last 3 days.  Patient had multiple bowel movements and they all watery.  No blood.  No fever.  Denied any significant abdominal pain.  She is generally weak and fatigued.  Patient has weakness getting worse to the point where she is unable to walk to the bathroom.  She also has history of COPD but no acute exacerbation.  Patient seen in the ER with severe neutropenia.  White count of 0.4.  Hemoglobin is 8.8.  She is hyponatremic and CT showed findings of possible colitis.  Patient has apparently been taking Imodium  prior to this.  She will be admitted to the hospital for management of acute colitis.  Review of Systems: As mentioned in the history of present illness. All other systems reviewed and are negative. Past Medical History:  Diagnosis Date   Anemia    as a child   Anxiety    Asthma 10/19/2020   Bursitis    Chronic reflux esophagitis    Complication of anesthesia    Diarrhea, functional    Diverticulosis    GERD (gastroesophageal reflux disease)    History of kidney stones    Hypertension    Knee pain    right knee-seeing ortho   lung ca 09/2019   Osteoarthritis    Plantar fasciitis    Pneumonia    PONV (postoperative nausea and vomiting)    has used the patch before and that helps   Pure hypercholesterolemia    RLS (restless legs syndrome)    Superficial  vein thrombosis    Thyroid  disease    hypothyroid   Past Surgical History:  Procedure Laterality Date   APPENDECTOMY     BRONCHIAL BIOPSY  10/20/2019   Procedure: BRONCHIAL BIOPSIES;  Surgeon: Shelah Lamar RAMAN, MD;  Location: [facility] ENDOSCOPY;  Service: Pulmonary;;   BRONCHIAL BRUSHINGS  10/20/2019   Procedure: BRONCHIAL BRUSHINGS;  Surgeon: Shelah Lamar RAMAN, MD;  [facility] END[facility]COPY;  Service: Pulmonary;;   BRONCHIAL NEEDLE ASPIRATION BIOPSY  10/20/2019   Procedure: BRONCHIAL NEEDLE ASPIRATION BIOPSIES;  Surgeon: Shelah Lamar RAMAN, MD;  Location: [facility] ENDOSCOPY;  Service: Pulmonary;;   BRONCHIAL WASHINGS  10/20/2019   Procedure: BRONCHIAL WASHINGS;  Surgeon: Shelah Lamar RAMAN, MD;  Location: [facility] ENDOSCOPY;  Service: Pulmonary;;   CARPAL TUNNEL RELEASE Right 2011   Dr. Camella   CHOLECYSTECTOMY N/A    COLPORRHAPHY     posterior   HAND SURGERY Right 2016   Nerve surgery, Dr. Camella   IR KYPHO LUMBAR INC FX REDUCE BONE BX UNI/BIL CANNULATION INC/IMAGING  07/03/2023   OVARIAN CYST REMOVAL     RECTOCELE REPAIR  2011   w/TVH and sling   TONSILLECTOMY     TONSILLECTOMY     TOTAL KNEE ARTHROPLASTY Left 09/09/2018   Procedure: TOTAL KNEE ARTHROPLASTY, CORTISONE INJECTION RIGHT KNEE;  Surgeon: Ernie Cough, MD;  Location: [facility];  Service: Orthopedics;  Laterality: Left;  70 mins   TOTAL KNEE ARTHROPLASTY Right 04/16/2023   Procedure: TOTAL KNEE ARTHROPLASTY;  Surgeon: Ernie Cough, MD;  Location: [facility];  Service: Orthopedics;  Laterality: Right;   TOTAL VAGINAL HYSTERECTOMY  10/18/2009   rectocele repair, sling   TUBAL LIGATION Bilateral    VARICOSE VEIN SURGERY     VIDEO BRONCHOSCOPY WITH ENDOBRONCHIAL NAVIGATION N/A 10/20/2019   Procedure: VIDEO BRONCHOSCOPY WITH ENDOBRONCHIAL NAVIGATION;  Surgeon: Shelah Lamar RAMAN, MD;  Location: [facility] ENDOSCOPY;  Service: Pulmonary;  Laterality: N/A;   Social History:  reports that she has never smoked. She has never used smokeless tobacco. She reports that  she does not currently use alcohol after a past usage of about 1.0 standard drink of alcohol per week. She reports that she does not use drugs.  No Known Allergies  Family History  Problem Relation Age of Onset   Cervical cancer Mother    Heart failure Mother    Heart failure Father    Diabetes Father    Cancer Brother    Breast cancer Maternal Aunt    Breast cancer Paternal Aunt     Prior to Admission medications   Medication Sig Start Date End Date Taking? Authorizing Provider  albuterol  (VENTOLIN  HFA) 108 (90 Base) MCG/ACT inhaler Inhale 2 puffs into the lungs every 6 (six) hours as needed for wheezing or shortness of breath. 01/21/23   Chandra Harlene LABOR, NP  alum & mag hydroxide-simeth (MAALOX/MYLANTA) 200-200-20 MG/5ML suspension Take 15 mLs by mouth every 6 (six) hours as needed for indigestion or heartburn. 05/30/23   Shahmehdi, Adriana LABOR, MD  Ascorbic Acid (VITAMIN C) 500 MG CAPS Take 500 mg by mouth in the morning and at bedtime. Take with methenamine 12/03/23 11/27/24  Vu, Constance T, MD  chlorhexidine  (HIBICLENS ) 4 % external liquid Apply 15 mLs (1 Application total) topically as directed for 30 doses. Use as directed daily for 5 days every other week for 6 weeks. 04/16/23   Patti Rosina SAUNDERS, PA-C  Cholecalciferol (VITAMIN D ) 50 MCG (2000 UT) tablet Take 2,000 Units by mouth daily.    [provider]  ciprofloxacin  (CIPRO ) 500 MG tablet Take 500 mg by mouth 2 (two) times daily. 09/23/23   [provider]  dexamethasone  (DECADRON ) 4 MG tablet Take 1 tab 2 times daily starting day before pemetrexed. Then take 2 tabs daily x 3 days starting day after carboplatin . Take with food. 11/12/23   Sherrod Sherrod, MD  diphenoxylate -atropine  (LOMOTIL ) 2.5-0.025 MG tablet Take 1 tablet by mouth 4 (four) times daily as needed for diarrhea or loose stools. 12/17/23   Heilingoetter, Cassandra L, PA-C  docusate sodium  (COLACE) 100 MG capsule Take 1 capsule (100 mg total) by mouth 2 (two)  times daily. 06/28/23 06/27/24  Pappayliou, Dorothyann A, DO  famotidine  (PEPCID ) 40 MG tablet Take 40 mg by mouth at bedtime.    [provider]  ferrous sulfate  325 (65 FE) MG tablet Take 325 mg by mouth daily with breakfast.    [provider]  fexofenadine (ALLEGRA) 180 MG tablet Take 180 mg by mouth daily as needed for allergies.    [provider]  fluticasone  (FLONASE ) 50 MCG/ACT nasal spray Place 1 spray into both nostrils as needed for allergies. 10/01/19   [provider]  folic acid  (FOLVITE ) 1 MG tablet Take 1 tablet (1 mg total) by mouth daily. Start 7 days before pemetrexed chemotherapy. Continue until 21 days after pemetrexed completed. 11/12/23   Sherrod Sherrod, MD  HYDROcodone -acetaminophen  (  NORCO/VICODIN) 5-325 MG tablet Take 1 tablet by mouth every 6 (six) hours as needed for moderate pain (pain score 4-6). 11/19/23   Shannon Agent, MD  levothyroxine  (SYNTHROID ) 100 MCG tablet Take 1 tablet (100 mcg total) by mouth daily. One po qd 11/13/22   Cleotilde Ronal RAMAN, MD  lidocaine  (LIDODERM ) 5 % Place 1 patch onto the skin daily. 04/26/20   [provider]  lidocaine  (XYLOCAINE ) 2 % solution Use as directed 15 mLs in the mouth or throat every 6 (six) hours as needed for mouth pain. 11/28/23   Sherrod Sherrod, MD  lidocaine -prilocaine  (EMLA ) cream Apply to affected area once 11/12/23   Sherrod Sherrod, MD  LORazepam  (ATIVAN ) 0.5 MG tablet Take 0.5 mg by mouth at bedtime.     [provider]  MAGNESIUM  PO Take 1 tablet by mouth daily.    [provider]  melatonin 5 MG TABS Take 5 mg by mouth at bedtime as needed (sleep).    [provider]  methenamine (HIPREX) 1 g tablet Take 1 tablet (1 g total) by mouth 2 (two) times daily with a meal. 12/03/23 11/27/24  Vu, Constance T, MD  metoprolol  succinate (TOPROL -XL) 50 MG 24 hr tablet Take 1 tablet (50 mg total) by mouth daily. 01/02/23   Strader, Laymon HERO, PA-C  MYRBETRIQ  50 MG TB24  tablet Take 50 mg by mouth daily. 02/08/21   [provider]  ondansetron  (ZOFRAN ) 4 MG tablet Take 1 tablet (4 mg total) by mouth every 6 (six) hours as needed for nausea. 06/28/23   Pappayliou, Dorothyann A, DO  osimertinib  mesylate (TAGRISSO ) 80 MG tablet Take 1 tablet (80 mg total) by mouth daily. 11/12/23   Sherrod Sherrod, MD  oxyCODONE  (OXY IR/ROXICODONE ) 5 MG immediate release tablet Take 5 mg by mouth every 6 (six) hours as needed for severe pain (pain score 7-10). 06/20/23   [provider]  POTASSIUM PO Take 1 tablet by mouth daily.    [provider]  PREMARIN vaginal cream Place 1 applicator vaginally as needed (urinary issues). 12/19/21   [provider]  prochlorperazine  (COMPAZINE ) 10 MG tablet Take 1 tablet (10 mg total) by mouth every 6 (six) hours as needed for nausea or vomiting. 11/12/23   Sherrod Sherrod, MD  RABEprazole  (ACIPHEX ) 20 MG tablet Take 1 tablet (20 mg total) by mouth 2 (two) times a week. 03/08/16   Cleotilde Ronal RAMAN, MD  rosuvastatin  (CRESTOR ) 5 MG tablet Take 1 tablet (5 mg total) by mouth daily. 01/02/23   Johnson, Laymon HERO, PA-C  sodium chloride  1 g tablet Take 1 tablet (1 g total) by mouth 2 (two) times daily with a meal. 11/21/23   Sherrod Sherrod, MD  STIOLTO RESPIMAT  2.5-2.5 MCG/ACT AERS INHALE TWO PUFFS INTO THE LUNGS DAILY 10/16/23   Hope Almarie ORN, NP  triamcinolone  cream (KENALOG ) 0.1 % Apply 1 Application topically 2 (two) times daily. As needed for itching/rash 07/20/22   Heilingoetter, Cassandra L, PA-C  vitamin B-12 (CYANOCOBALAMIN ) 1000 MCG tablet Take 1,000 mcg by mouth daily.    [provider]    Physical Exam: Vitals:   12/22/23 1828 12/22/23 1830 12/22/23 1915 12/22/23 1940  BP: 114/66 114/69 113/68   Pulse: (!) 103 (!) 104 (!) 102   Resp: 14 16 12    Temp:    98.3 F (36.8 C)  TempSrc:    Oral  SpO2: 100% 100% 100%   Weight:      Height:  Constitutional: Chronically ill looking NAD, calm,  comfortable Eyes: PERRL, lids and conjunctivae normal ENMT: Mucous membranes are dry. Posterior pharynx clear of any exudate or lesions.Normal dentition.  Neck: normal, supple, no masses, no thyromegaly Respiratory: clear to auscultation bilaterally, no wheezing, no crackles. Normal respiratory effort. No accessory muscle use.  Cardiovascular: Sinus tachycardia, no murmurs / rubs / gallops. No extremity edema. 2+ pedal pulses. No carotid bruits.  Abdomen: Diffuse tenderness, no masses palpated. No hepatosplenomegaly. Bowel sounds positive.  Musculoskeletal: Good range of motion, no joint swelling or tenderness, Skin: no rashes, lesions, ulcers. No induration Neurologic: CN 2-12 grossly intact. Sensation intact, DTR normal. Strength 5/5 in all 4.  Psychiatric: Normal judgment and insight. Alert and oriented x 3. Normal mood  Data Reviewed:  Temperature 98.3, blood pressure 87/65, pulse 144 respirate 21, white count 0.4, hemoglobin 8.8, platelets 94, calcium  8.1 sodium is 134 and glucose 100.  Albumin 2.7.  EKG showed normal sinus rhythm.  Chest x-ray showed scarring bilaterally in the lower lung fields with no consolidations.  CT chest and abdomen showed diffuse fluid levels in the colon suggesting diarrheal illness.  There is increasing perihilar airspace consolidation bilaterally likely from radiation changes.  Stable sclerotic changes in the bones compatible with known metastasis..  Assessment and Plan:  #1 acute colitis: Patient with diarrheal illness is likely colitis.  Suspicion for radiation or chemotherapy induced colitis or infectious colitis.  Patient will be admitted.  Aggressive hydration.  Empiric antibiotics.  Supportive care.  She also has nausea.  Continue Compazine .  This may all be related to her recent chemo therapy.  #2 severe pancytopenia: Patient is particularly neutropenic.  Will put her on neutropenic precaution continue with other supportive care.  Empiric antibiotics.  No  fever.  #3 acquired hypothyroidism: Continue with levothyroxine .  #4 GERD: Continue with PPIs  #5 adenocarcinoma of the left lung stage III: Continue per oncology.  #6 hyperlipidemia: Will continue statin  #7 essential hypertension: Blood pressure is controlled.  Will resume home regimen.    Advance Care Planning:   Code Status: Prior full code  Consults: None but may need oncology consult  Family Communication: No family at bedside  Severity of Illness: The appropriate patient status for this patient is INPATIENT. Inpatient status is judged to be reasonable and necessary in order to provide the required intensity of service to ensure the patient's safety. The patient's presenting symptoms, physical exam findings, and initial radiographic and laboratory data in the context of their chronic comorbidities is felt to place them at high risk for further clinical deterioration. Furthermore, it is not anticipated that the patient will be medically stable for discharge from the hospital within 2 midnights of admission.   * I certify that at the point of admission it is my clinical judgment that the patient will require inpatient hospital care spanning beyond 2 midnights from the point of admission due to high intensity of service, high risk for further deterioration and high frequency of surveillance required.*  AuthorBETHA SIM KNOLL, MD 12/22/2023 8:03 PM  For on call review [email or web address].

## 2023-12-22 NOTE — Sepsis Progress Note (Signed)
 eLink Sepsis tracking per protocol.

## 2023-12-22 NOTE — ED Triage Notes (Signed)
 The pt has had some diarrhea and nausea for the  past 3 days  hx of lung and back chemotherapy  currently on radiation

## 2023-12-22 NOTE — ED Provider Triage Note (Signed)
 Emergency Medicine Provider Triage Evaluation Note  Linda Woods , a 73 y.o. female  was evaluated in triage.  Pt complains of 3 days of generalized weakness, fatigue, diarrhea, feeling ill.  Complains of approximately 5 loose stools a day even with taking Imodium .  She is chronically on an antibiotic to prevent UTI.  Review of Systems  Positive: Abd pain  Negative: fever  Physical Exam  BP (!) 87/65   Pulse (!) 144   Temp 97.8 F (36.6 C)   Resp 14   Ht 5' (1.524 m)   Wt 65.3 kg   LMP 08/21/2002   SpO2 98%   BMI 28.12 kg/m  Gen:   Awake, no distress   Resp:  Normal effort  MSK:   Moves extremities without difficulty  Other:    Medical Decision Making  Medically screening exam initiated at 3:24 PM.  Appropriate orders placed.  Devere Kelsey Getting was informed that the remainder of the evaluation will be completed by another provider, this initial triage assessment does not replace that evaluation, and the importance of remaining in the ED until their evaluation is complete.  Has a history of lung cancer and receiving chemotherapy as well as radiation.   Shermon Warren SAILOR, PA-C 12/22/23 1525

## 2023-12-22 NOTE — ED Notes (Signed)
 Patient transported to CT

## 2023-12-22 NOTE — ED Provider Notes (Signed)
 [place] EMERGENCY DEPARTMENT AT [facility] Provider Note   CSN: [id number] Arrival date & time: 12/22/23  1506     Patient presents with: Diarrhea   Linda Woods is a 73 y.o. 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 Reports she is so weak she is not able to make it to the bathroom/walk, previously was doing okay. Last chemo 5 days ago. She reports she has had approximately 5 loose stools for the past 3-4 days.  She has not had any reported fever at home.  Denies chest pain shortness of breath or cough.  She has been taking Imodium  without any significant improvement.    Diarrhea      Prior to Admission medications   Medication Sig Start Date End Date Taking? Authorizing Provider  albuterol  (VENTOLIN  HFA) 108 (90 Base) MCG/ACT inhaler Inhale 2 puffs into the lungs every 6 (six) hours as needed for wheezing or shortness of breath. 01/21/23   Chandra Harlene LABOR, NP  alum & mag hydroxide-simeth (MAALOX/MYLANTA) 200-200-20 MG/5ML suspension Take 15 mLs by mouth every 6 (six) hours as needed for indigestion or heartburn. 05/30/23   Shahmehdi, Adriana LABOR, MD  Ascorbic Acid (VITAMIN C) 500 MG CAPS Take 500 mg by mouth in the morning and at bedtime. Take with methenamine 12/03/23 11/27/24  Vu, Constance T, MD  chlorhexidine  (HIBICLENS ) 4 % external liquid Apply 15 mLs (1 Application total) topically as directed for 30 doses. Use as directed daily for 5 days every other week for 6 weeks. 04/16/23   Patti Rosina SAUNDERS, PA-C  Cholecalciferol (VITAMIN D ) 50 MCG (2000 UT) tablet Take 2,000 Units by mouth daily.    [provider]  ciprofloxacin  (CIPRO ) 500 MG tablet Take 500 mg by mouth 2 (two) times daily. 09/23/23   [provider]  dexamethasone  (DECADRON ) 4  MG tablet Take 1 tab 2 times daily starting day before pemetrexed. Then take 2 tabs daily x 3 days starting day after carboplatin . Take with food. 11/12/23   Sherrod Sherrod, MD  diphenoxylate -atropine  (LOMOTIL ) 2.5-0.025 MG tablet Take 1 tablet by mouth 4 (four) times daily as needed for diarrhea or loose stools. 12/17/23   Heilingoetter, Cassandra L, PA-C  docusate sodium  (COLACE) 100 MG capsule Take 1 capsule (100 mg total) by mouth 2 (two) times daily. 06/28/23 06/27/24  Pappayliou, Dorothyann A, DO  famotidine  (PEPCID ) 40 MG tablet Take 40 mg by mouth at bedtime.    [provider]  ferrous sulfate  325 (65 FE) MG tablet Take 325 mg by mouth daily with breakfast.    [provider]  fexofenadine (ALLEGRA) 180 MG tablet Take 180 mg by mouth daily as needed for allergies.    [provider]  fluticasone  (FLONASE ) 50 MCG/ACT nasal spray Place 1 spray into both nostrils as needed for allergies. 10/01/19   [provider]  folic acid  (FOLVITE ) 1 MG tablet Take 1 tablet (1 mg total) by mouth daily. Start 7 days before pemetrexed chemotherapy. Continue until 21 days after pemetrexed completed. 11/12/23   Sherrod Sherrod, MD  HYDROcodone -acetaminophen  (NORCO/VICODIN) 5-325 MG tablet Take 1 tablet by mouth every 6 (six) hours as needed for moderate pain (pain score 4-6). 11/19/23   Shannon Agent, MD  levothyroxine  (SYNTHROID ) 100 MCG tablet Take 1 tablet (100 mcg total) by mouth daily. One po qd 11/13/22  Cleotilde Ronal RAMAN, MD  lidocaine  (LIDODERM ) 5 % Place 1 patch onto the skin daily. 04/26/20   [provider]  lidocaine  (XYLOCAINE ) 2 % solution Use as directed 15 mLs in the mouth or throat every 6 (six) hours as needed for mouth pain. 11/28/23   Sherrod Sherrod, MD  lidocaine -prilocaine  (EMLA ) cream Apply to affected area once 11/12/23   Sherrod Sherrod, MD  LORazepam  (ATIVAN ) 0.5 MG tablet Take 0.5 mg by mouth at bedtime.     [provider]  MAGNESIUM  PO Take  1 tablet by mouth daily.    [provider]  melatonin 5 MG TABS Take 5 mg by mouth at bedtime as needed (sleep).    [provider]  methenamine (HIPREX) 1 g tablet Take 1 tablet (1 g total) by mouth 2 (two) times daily with a meal. 12/03/23 11/27/24  Vu, Constance T, MD  metoprolol  succinate (TOPROL -XL) 50 MG 24 hr tablet Take 1 tablet (50 mg total) by mouth daily. 01/02/23   Strader, Laymon HERO, PA-C  MYRBETRIQ  50 MG TB24 tablet Take 50 mg by mouth daily. 02/08/21   [provider]  ondansetron  (ZOFRAN ) 4 MG tablet Take 1 tablet (4 mg total) by mouth every 6 (six) hours as needed for nausea. 06/28/23   Pappayliou, Dorothyann A, DO  osimertinib  mesylate (TAGRISSO ) 80 MG tablet Take 1 tablet (80 mg total) by mouth daily. 11/12/23   Sherrod Sherrod, MD  oxyCODONE  (OXY IR/ROXICODONE ) 5 MG immediate release tablet Take 5 mg by mouth every 6 (six) hours as needed for severe pain (pain score 7-10). 06/20/23   [provider]  POTASSIUM PO Take 1 tablet by mouth daily.    [provider]  PREMARIN vaginal cream Place 1 applicator vaginally as needed (urinary issues). 12/19/21   [provider]  prochlorperazine  (COMPAZINE ) 10 MG tablet Take 1 tablet (10 mg total) by mouth every 6 (six) hours as needed for nausea or vomiting. 11/12/23   Sherrod Sherrod, MD  RABEprazole  (ACIPHEX ) 20 MG tablet Take 1 tablet (20 mg total) by mouth 2 (two) times a week. 03/08/16   Cleotilde Ronal RAMAN, MD  rosuvastatin  (CRESTOR ) 5 MG tablet Take 1 tablet (5 mg total) by mouth daily. 01/02/23   Johnson, Laymon HERO, PA-C  sodium chloride  1 g tablet Take 1 tablet (1 g total) by mouth 2 (two) times daily with a meal. 11/21/23   Sherrod Sherrod, MD  STIOLTO RESPIMAT  2.5-2.5 MCG/ACT AERS INHALE TWO PUFFS INTO THE LUNGS DAILY 10/16/23   Hope Almarie ORN, NP  triamcinolone  cream (KENALOG ) 0.1 % Apply 1 Application topically 2 (two) times daily. As needed for itching/rash 07/20/22   Heilingoetter,  Cassandra L, PA-C  vitamin B-12 (CYANOCOBALAMIN ) 1000 MCG tablet Take 1,000 mcg by mouth daily.    [provider]    Allergies: Patient has no known allergies.    Review of Systems  Gastrointestinal:  Positive for diarrhea.    Updated Vital Signs BP 114/66 (BP Location: Left Arm)   Pulse (!) 103   Temp 97.6 F (36.4 C) (Oral)   Resp 14   Ht 5' (1.524 m)   Wt 65.3 kg   LMP 08/21/2002   SpO2 100%   BMI 28.12 kg/m   Physical Exam Vitals and nursing note reviewed.  Constitutional:      General: She is not in acute distress.    Appearance: She is ill-appearing. She is not toxic-appearing.  HENT:     Head: Normocephalic and atraumatic.  Eyes:     General: No scleral icterus.    Conjunctiva/sclera: Conjunctivae normal.  Cardiovascular:     Rate and Rhythm: Regular rhythm. Tachycardia present.     Pulses: Normal pulses.     Heart sounds: Normal heart sounds.  Pulmonary:     Effort: Pulmonary effort is normal. No respiratory distress.     Breath sounds: Normal breath sounds.  Abdominal:     General: Abdomen is flat. Bowel sounds are normal.     Palpations: Abdomen is soft.     Tenderness: There is abdominal tenderness.  Musculoskeletal:     Right lower leg: No edema.     Left lower leg: No edema.  Skin:    General: Skin is warm and dry.     Findings: No lesion.  Neurological:     General: No focal deficit present.     Mental Status: She is alert and oriented to person, place, and time. Mental status is at baseline.     (all labs ordered are listed, but only abnormal results are displayed) Labs Reviewed  COMPREHENSIVE METABOLIC PANEL WITH GFR - Abnormal; Notable for the following components:      Result Value   Sodium 134 (*)    CO2 21 (*)    Glucose, Bld 102 (*)    Calcium  8.1 (*)    Total Protein 5.2 (*)    Albumin 2.7 (*)    All other components within normal limits  CBC WITH DIFFERENTIAL/PLATELET - Abnormal; Notable for the following components:    WBC 0.4 (*)    RBC 3.01 (*)    Hemoglobin 9.6 (*)    HCT 28.6 (*)    Platelets 94 (*)    Neutro Abs 0.3 (*)    Lymphs Abs 0.1 (*)    Monocytes Absolute 0.0 (*)    All other components within normal limits  I-STAT CHEM 8, ED - Abnormal; Notable for the following components:   Glucose, Bld 100 (*)    Hemoglobin 8.8 (*)    HCT 26.0 (*)    All other components within normal limits  CULTURE, BLOOD (ROUTINE X 2)  CULTURE, BLOOD (ROUTINE X 2)  C DIFFICILE QUICK SCREEN W PCR REFLEX    GASTROINTESTINAL PANEL BY PCR, STOOL (REPLACES STOOL CULTURE)  PROTIME-INR  MAGNESIUM   URINALYSIS, W/ REFLEX TO CULTURE (INFECTION SUSPECTED)  I-STAT CG4 LACTIC ACID, ED    EKG: EKG Interpretation Date/Time:  Sunday December 22 2023 15:32:02 EST Ventricular Rate:  144 PR Interval:  112 QRS Duration:  66 QT Interval:  344 QTC Calculation: 532 R Axis:   76  Text Interpretation: Sinus tachycardia ST & T wave abnormality, consider inferolateral ischemia Abnormal ECG When compared with ECG of 21-Jan-2023 13:36, nonspecific STs more pronounced Confirmed by Towana Sharper [phone]) on 12/22/2023 3:51:31 PM  Radiology: CT CHEST ABDOMEN PELVIS W CONTRAST Result Date: 12/22/2023 CLINICAL DATA:  Sepsis. EXAM: CT CHEST, ABDOMEN, AND PELVIS WITH CONTRAST TECHNIQUE: Multidetector CT imaging of the chest, abdomen and pelvis was performed following the standard protocol during bolus administration of intravenous contrast. RADIATION DOSE REDUCTION: This exam was performed according to the departmental dose-optimization program which includes automated exposure control, adjustment of the mA and/or kV according to patient size and/or use of iterative reconstruction technique. CONTRAST:  75mL OMNIPAQUE  IOHEXOL  350 MG/ML SOLN COMPARISON:  10/18/2023, 09/30/2023. FINDINGS: CT CHEST FINDINGS Cardiovascular: The heart is normal in size and there is a trace pericardial effusion. There is atherosclerotic calcification of the aorta  without evidence of aneurysm. The pulmonary trunk is normal in caliber. Mediastinum/Nodes: No mediastinal, hilar, or axillary lymphadenopathy. The thyroid  gland, trachea, and esophagus are within normal limits. There is a moderate hiatal hernia. A cystic structure is noted in the posterior mediastinum measuring 2.9 cm, unchanged. Lungs/Pleura: Apical pleural scarring is noted on the left. There is perihilar consolidation in the left upper lobe with scarring, bronchiectasis, and volume loss, measuring 4.7 x 3.2 cm versus 3.7 x 2.5 cm on the prior exam, axial image 63. Increasing consolidation is noted in the perihilar region on the right with associated architectural distortion. There is a stable nodular density in the right upper lobe measuring 5 mm, axial image 55. No effusion or pneumothorax is seen. Musculoskeletal: Degenerative changes are present in the thoracic spine. Stable sclerotic lesions are noted in the bones. No acute fracture is seen. CT ABDOMEN PELVIS FINDINGS Hepatobiliary: No focal abnormality in the liver. Fatty infiltration of the liver is present. The gallbladder is not seen. There is stable dilatation the biliary tree, query post cholecystectomy status. Pancreas: Unremarkable. No pancreatic ductal dilatation or surrounding inflammatory changes. Spleen: Normal in size without focal abnormality. Adrenals/Urinary Tract: A there is a right adrenal nodule measuring 1.3 x 1.0 cm, slightly increased in size. No adrenal nodule on the left. The kidneys enhance symmetrically. Renal cysts are present on the right. No renal calculus bilaterally. There is mild hydronephrosis on the left without evidence of obstructing stone common unchanged from previous exam. The bladder is unremarkable. Stomach/Bowel: There is a moderate hiatal hernia. The stomach is otherwise within normal limits. No bowel obstruction, free air, or pneumatosis is seen. Scattered diverticular present along the colon without evidence of  diverticulitis. Fluid attenuation is noted throughout the colon. Appendix is not seen. Vascular/Lymphatic: Aortic atherosclerosis. Mildly enlarged lymph node is noted in the periaortic space on the left in the upper abdomen measuring 9 mm. Reproductive: Status post hysterectomy. No adnexal masses. Other: No abdominopelvic ascites. Musculoskeletal: Degenerative changes and stable sclerotic lesions are noted in the bones. 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 There is a compression deformity with kyphoplasty changes at L1. IMPRESSION: 1. Diffuse fluid levels in the colon suggesting diarrheal illness. 2. Increasing perihilar airspace consolidation bilaterally, likely related to prior radiation changes, however superimposed is infection or malignancy cannot be excluded. 3. Stable sclerotic lesions in the bones, compatible with known metastasis. 4. Aortic atherosclerosis. 5. Remaining stable findings as described above. Electronically Signed   By: Leita Birmingham M.D.   On: 12/22/2023 18:09   DG Chest Port 1 View Result Date: 12/22/2023 CLINICAL DATA:  Sepsis EXAM: PORTABLE CHEST 1 VIEW COMPARISON:  Chest radiograph dated 05/26/2023 FINDINGS: Normal lung volumes. Unchanged left apical and right perihilar scarring. No focal consolidations. No pleural effusion or pneumothorax. The heart size and mediastinal contours are within normal limits. No acute osseous abnormality. IMPRESSION: Unchanged scarring in the bilateral lungs.  No focal consolidations. Electronically Signed   By: Limin  Xu M.D.   On: 12/22/2023 17:52     .Critical Care  Performed by: Shermon Warren SAILOR, PA-C Authorized by: Shermon Warren SAILOR, PA-C   Critical care provider statement:    Critical care time (minutes):  45   Critical care was necessary to treat or prevent imminent or life-threatening deterioration of the following conditions:  Sepsis   Critical care was time spent personally by me on the following activities:   Development of treatment plan with patient or surrogate, discussions with consultants,  evaluation of patient's response to treatment, examination of patient, ordering and review of laboratory studies, ordering and review of radiographic studies, ordering and performing treatments and interventions, pulse oximetry, re-evaluation of patient's condition and review of old charts    Medications Ordered in the ED  lactated ringers  infusion ( Intravenous New Bag/Given 12/22/23 1802)  vancomycin  (VANCOCIN ) IVPB 1000 mg/200 mL premix (1,000 mg Intravenous New Bag/Given 12/22/23 1806)  sodium chloride  0.9 % bolus 1,000 mL (0 mLs Intravenous Stopped 12/22/23 1743)  ceFEPIme (MAXIPIME) 2 g in sodium chloride  0.9 % 100 mL IVPB (0 g Intravenous Stopped 12/22/23 1743)  metroNIDAZOLE (FLAGYL) IVPB 500 mg (0 mg Intravenous Stopped 12/22/23 1759)  iohexol  (OMNIPAQUE ) 350 MG/ML injection 75 mL (75 mLs Intravenous Contrast Given 12/22/23 1731)  sodium chloride  0.9 % bolus 500 mL (500 mLs Intravenous New Bag/Given 12/22/23 1801)    Clinical Course as of 12/22/23 1924  Sun Dec 22, 2023  1643 WBC(!!): 0.4 [JB]  1643 NEUT#(!!): 0.3 [JB]  1643 Lactic Acid, Venous: 0.7 [JB]  1924 Requested home pain medications for chronic back pain, ordered.  [JB]    Clinical Course User Index [JB] Shem Plemmons, Warren SAILOR, PA-C                                 Medical Decision Making Amount and/or Complexity of Data Reviewed Labs: ordered. Decision-making details documented in ED Course. Radiology: ordered.  Risk Prescription drug management. Decision regarding hospitalization.   This patient presents to the ED for concern of abdominal pain, this involves Linda extensive number of treatment options, and is a complaint that carries with it a high risk of complications and morbidity.  The differential diagnosis includes cholecystitis, AAA, appendicitis, renal stone, UTI, SBO, colitis, diverticulitis    Co morbidities that complicate the  patient evaluation  Hyperlipidemia, hypertension, hypothyroidism, GERD, adenocarcinoma of the left lung on chemotherapy and radiation, COPD    Additional history obtained:  Additional history obtained from reviewed patient's oncology visit currently has renal carcinoma being treated with both chemotherapy and radiation.   Lab Tests:  I personally interpreted labs.  The pertinent results include:  CBC shows leukopenia white count is 0.4 CMP, magnesium , PT/INR unremarkable Lactic is 0.7 Blood cultures pending  Imaging Studies ordered:  I ordered imaging studies including CT scan of chest abdomen pelvis I independently visualized and interpreted imaging which showed diffuse fluid levels in colon suggesting diarrheal illness, increased perihilar airspace consolidation likely due to radiation however could potentially be a superimposed infection or malignancy, stable sclerotic lesion of bone. I agree with the radiologist interpretation   Cardiac Monitoring: / EKG:  The patient was maintained on a cardiac monitor.  I personally viewed and interpreted the cardiac monitored which showed Linda underlying rhythm of: Tachycardia   Consultations Obtained:  I requested consultation with the hospital team for admission,  and discussed lab and imaging findings as well as pertinent plan.   Problem List / ED Course / Critical interventions / Medication management  Presents with abdominal pain associated with loose stools and generalized fatigue and weakness.  On arrival she is hypotensive with systolic in 80s and significantly tachycardic.  Her EKG shows sinus tachycardic.  She does have low white count at 0.4 but she is meeting sepsis criteria.  Will give fluid bolus and start her on antibiotic for undifferentiated sepsis.  Will obtain CT chest abdomen pelvis.  She does endorse that she is  having frequent loose stools and will attempt to obtain C. difficile panel as well.  Blood cultures are  pending. CT scan shows findings consistent with diarrheal illness which is consistent with patient's current presentation.  Her blood pressure and heart rate have improved after receiving 1.5 L fluid here. Still feeling poorly, weak, feel she would benefit for admission and monitor Bcx in meantime. Family and patient wanting admission due to feeling so weak at home.  I ordered medication including 2L NS, abx Reevaluation of the patient after these medicines showed that the patient stayed the same I have reviewed the patients home medicines and have made adjustments as needed      Final diagnoses:  Diarrhea, unspecified type    ED Discharge Orders     None          Shermon Warren LOISE RIGGERS 12/22/23 1907    Towana Ozell BROCKS, MD 12/23/23 [phone]

## 2023-12-22 NOTE — Plan of Care (Signed)

## 2023-12-22 NOTE — ED Notes (Signed)
 X-ray at bedside.

## 2023-12-22 NOTE — ED Notes (Signed)
 CCMD Called

## 2023-12-23 ENCOUNTER — Inpatient Hospital Stay

## 2023-12-23 ENCOUNTER — Telehealth: Payer: Self-pay | Admitting: *Deleted

## 2023-12-23 DIAGNOSIS — E43 Unspecified severe protein-calorie malnutrition: Secondary | ICD-10-CM | POA: Insufficient documentation

## 2023-12-23 DIAGNOSIS — K529 Noninfective gastroenteritis and colitis, unspecified: Secondary | ICD-10-CM | POA: Diagnosis not present

## 2023-12-23 LAB — CBC WITH DIFFERENTIAL/PLATELET
Abs Immature Granulocytes: 0.03 K/uL (ref 0.00–0.07)
Basophils Absolute: 0 K/uL (ref 0.0–0.1)
Basophils Relative: 0 %
Eosinophils Absolute: 0 K/uL (ref 0.0–0.5)
Eosinophils Relative: 5 %
HCT: 26.3 % — ABNORMAL LOW (ref 36.0–46.0)
Hemoglobin: 8.8 g/dL — ABNORMAL LOW (ref 12.0–15.0)
Immature Granulocytes: 5 %
Lymphocytes Relative: 19 %
Lymphs Abs: 0.1 K/uL — ABNORMAL LOW (ref 0.7–4.0)
MCH: 32.2 pg (ref 26.0–34.0)
MCHC: 33.5 g/dL (ref 30.0–36.0)
MCV: 96.3 fL (ref 80.0–100.0)
Monocytes Absolute: 0 K/uL — ABNORMAL LOW (ref 0.1–1.0)
Monocytes Relative: 3 %
Neutro Abs: 0.4 K/uL — CL (ref 1.7–7.7)
Neutrophils Relative %: 68 %
Platelets: 43 K/uL — ABNORMAL LOW (ref 150–400)
RBC: 2.73 MIL/uL — ABNORMAL LOW (ref 3.87–5.11)
RDW: 14.7 % (ref 11.5–15.5)
WBC: 0.6 K/uL — CL (ref 4.0–10.5)
nRBC: 0 % (ref 0.0–0.2)

## 2023-12-23 LAB — C DIFFICILE QUICK SCREEN W PCR REFLEX
C Diff antigen: POSITIVE — AB
C Diff toxin: NEGATIVE

## 2023-12-23 LAB — GASTROINTESTINAL PANEL BY PCR, STOOL (REPLACES STOOL CULTURE)

## 2023-12-23 LAB — CBC
HCT: 19.8 % — ABNORMAL LOW (ref 36.0–46.0)
Hemoglobin: 6.8 g/dL — CL (ref 12.0–15.0)
MCH: 32.4 pg (ref 26.0–34.0)
MCHC: 34.3 g/dL (ref 30.0–36.0)
MCV: 94.3 fL (ref 80.0–100.0)
Platelets: 53 K/uL — ABNORMAL LOW (ref 150–400)
RBC: 2.1 MIL/uL — ABNORMAL LOW (ref 3.87–5.11)
RDW: 14.7 % (ref 11.5–15.5)
WBC: 0.5 K/uL — CL (ref 4.0–10.5)
nRBC: 8.9 % — ABNORMAL HIGH (ref 0.0–0.2)

## 2023-12-23 LAB — PREPARE RBC (CROSSMATCH)

## 2023-12-23 LAB — COMPREHENSIVE METABOLIC PANEL WITH GFR
ALT: 10 U/L (ref 0–44)
AST: 14 U/L — ABNORMAL LOW (ref 15–41)
Albumin: 1.8 g/dL — ABNORMAL LOW (ref 3.5–5.0)
Alkaline Phosphatase: 47 U/L (ref 38–126)
Anion gap: 10 (ref 5–15)
BUN: 11 mg/dL (ref 8–23)
CO2: 20 mmol/L — ABNORMAL LOW (ref 22–32)
Calcium: 7.3 mg/dL — ABNORMAL LOW (ref 8.9–10.3)
Chloride: 105 mmol/L (ref 98–111)
Creatinine, Ser: 0.71 mg/dL (ref 0.44–1.00)
GFR, Estimated: >60 mL/min (ref >60)
Glucose, Bld: 117 mg/dL — ABNORMAL HIGH (ref 70–99)
Potassium: 3 mmol/L — ABNORMAL LOW (ref 3.5–5.1)
Sodium: 135 mmol/L (ref 135–145)
Total Bilirubin: 0.4 mg/dL (ref 0.0–1.2)
Total Protein: 3.7 g/dL — ABNORMAL LOW (ref 6.5–8.1)

## 2023-12-23 LAB — CLOSTRIDIUM DIFFICILE BY PCR, REFLEXED
Hypervirulent Strain: NEGATIVE
Toxigenic C. Difficile by PCR: POSITIVE — AB

## 2023-12-23 MED ORDER — LORAZEPAM 0.5 MG PO TABS
0.5000 mg | ORAL_TABLET | Freq: Four times a day (QID) | ORAL | Status: DC | PRN
  Administered 2023-12-23 – 2023-12-27 (×6): 0.5 mg via ORAL
  Filled 2023-12-23 (×6): qty 1

## 2023-12-23 MED ORDER — SODIUM CHLORIDE 0.9% IV SOLUTION: Freq: Once | INTRAVENOUS | Status: DC

## 2023-12-23 MED ORDER — LEVOTHYROXINE SODIUM 100 MCG PO TABS
100.0000 ug | ORAL_TABLET | Freq: Every day | ORAL | Status: DC
  Administered 2023-12-24 – 2023-12-27 (×4): 100 ug via ORAL
  Filled 2023-12-23 (×4): qty 1

## 2023-12-23 MED ORDER — VANCOMYCIN HCL 125 MG PO CAPS
125.0000 mg | ORAL_CAPSULE | Freq: Three times a day (TID) | ORAL | Status: DC
  Administered 2023-12-23 (×4): 125 mg via ORAL
  Filled 2023-12-23 (×7): qty 1

## 2023-12-23 MED ORDER — THIAMINE MONONITRATE 100 MG PO TABS
100.0000 mg | ORAL_TABLET | Freq: Every day | ORAL | Status: DC
  Administered 2023-12-23 – 2023-12-27 (×5): 100 mg via ORAL
  Filled 2023-12-23 (×5): qty 1

## 2023-12-23 MED ORDER — FLUTICASONE PROPIONATE 50 MCG/ACT NA SUSP
1.0000 | NASAL | Status: DC | PRN
Start: 2023-12-23 — End: 2023-12-27
  Filled 2023-12-23: qty 16

## 2023-12-23 MED ORDER — SODIUM CHLORIDE 0.9 % IV BOLUS
1000.0000 mL | Freq: Once | INTRAVENOUS | Status: AC
  Administered 2023-12-23: 1000 mL via INTRAVENOUS

## 2023-12-23 MED ORDER — BANATROL TF EN LIQD
60.0000 mL | Freq: Two times a day (BID) | ENTERAL | Status: DC
  Administered 2023-12-23 – 2023-12-27 (×9): 60 mL via ORAL
  Filled 2023-12-23 (×10): qty 60

## 2023-12-23 MED ORDER — FAMOTIDINE 20 MG PO TABS
40.0000 mg | ORAL_TABLET | Freq: Every day | ORAL | Status: DC
  Administered 2023-12-23 – 2023-12-26 (×4): 40 mg via ORAL
  Filled 2023-12-23 (×4): qty 2

## 2023-12-23 MED ORDER — POTASSIUM CHLORIDE CRYS ER 20 MEQ PO TBCR
40.0000 meq | EXTENDED_RELEASE_TABLET | ORAL | Status: AC
  Administered 2023-12-23 (×2): 40 meq via ORAL
  Filled 2023-12-23 (×2): qty 2

## 2023-12-23 MED ORDER — POTASSIUM CHLORIDE CRYS ER 20 MEQ PO TBCR
40.0000 meq | EXTENDED_RELEASE_TABLET | Freq: Once | ORAL | Status: AC
  Administered 2023-12-23: 40 meq via ORAL
  Filled 2023-12-23: qty 2

## 2023-12-23 MED ORDER — POTASSIUM CHLORIDE 10 MEQ/100ML IV SOLN
10.0000 meq | INTRAVENOUS | Status: DC
Start: 2023-12-23 — End: 2023-12-23
  Administered 2023-12-23: 10 meq via INTRAVENOUS
  Filled 2023-12-23: qty 100

## 2023-12-23 MED ORDER — FOLIC ACID 1 MG PO TABS
1.0000 mg | ORAL_TABLET | Freq: Every day | ORAL | Status: DC
  Administered 2023-12-23 – 2023-12-27 (×5): 1 mg via ORAL
  Filled 2023-12-23 (×5): qty 1

## 2023-12-23 MED ORDER — ADULT MULTIVITAMIN W/MINERALS CH
1.0000 | ORAL_TABLET | Freq: Every day | ORAL | Status: DC
  Administered 2023-12-23 – 2023-12-27 (×5): 1 via ORAL
  Filled 2023-12-23 (×5): qty 1

## 2023-12-23 MED ORDER — CALCIUM CARBONATE ANTACID 500 MG PO CHEW
1.0000 | CHEWABLE_TABLET | Freq: Three times a day (TID) | ORAL | Status: DC | PRN
  Administered 2023-12-23 – 2023-12-24 (×3): 200 mg via ORAL
  Filled 2023-12-23 (×3): qty 1

## 2023-12-23 MED ORDER — FERROUS SULFATE 325 (65 FE) MG PO TABS
325.0000 mg | ORAL_TABLET | Freq: Every day | ORAL | Status: DC
  Administered 2023-12-24 – 2023-12-27 (×4): 325 mg via ORAL
  Filled 2023-12-23 (×4): qty 1

## 2023-12-23 MED ORDER — VITAMIN B-12 1000 MCG PO TABS
1000.0000 ug | ORAL_TABLET | Freq: Every day | ORAL | Status: DC
  Administered 2023-12-23 – 2023-12-27 (×5): 1000 ug via ORAL
  Filled 2023-12-23 (×5): qty 1

## 2023-12-23 NOTE — Progress Notes (Signed)
 1 home med taken to pharmacy

## 2023-12-23 NOTE — Progress Notes (Signed)
 Pharmacist messaged this RN bc home med Tagrisso 's main side effect is colitis.  Stool pcr pending for c diff

## 2023-12-23 NOTE — Progress Notes (Signed)
 [facility] night cross cover note:   I was notified by the patient's RN of the patient's hemoglobin this morning of 6.8, compared to most recent prior value of 7.6 when checked around 2200 last night.  I subsequently ordered a type and screen followed by transfusion of 1 unit PRBC to occur over 3 hours, as well as an order for an updated H&H to be checked after completion of transfusion of this 1 unit PRBC.     [name], DO Hospitalist

## 2023-12-23 NOTE — Progress Notes (Signed)
 Messaged pharmacy to send vanc. Not here yet

## 2023-12-23 NOTE — Progress Notes (Signed)
 Initial Nutrition Assessment  DOCUMENTATION CODES:   Severe malnutrition in context of chronic illness  INTERVENTION:  Encourage po intake Liberalize diet to promote po intake  Small frequent meals/snacks  Discussed Low fiber, low fat diet for management of colitis and gallbladder issues Ensure Plus High Protein po BID, each supplement provides 350 kcal and 20 grams of protein Banatrol BD po-provides 45kcal, 5g soluble fiber and 2g protein per serving. MVI with minerals daily  100 mg Thiamine daily x 7 days  Monitor magnesium , potassium, and phosphorus daily for at least 3 days, MD to replete as needed, as pt is at risk for refeeding syndrome  High calorie, high protein handout in AVS  NUTRITION DIAGNOSIS:   Severe Malnutrition related to cancer and cancer related treatments as evidenced by severe muscle depletion, severe fat depletion, energy intake < or equal to 75% for > or equal to 1 month, percent weight loss (42 lbs weight loss, 26% in 6 months.).  GOAL:   Patient will meet greater than or equal to 90% of their needs, Weight gain   MONITOR:   PO intake, Supplement acceptance, Labs, Weight trends  REASON FOR ASSESSMENT:   Malnutrition Screening Tool    ASSESSMENT:  73 y.o. female with PMH of lung cancer stage IIIc on chemotherapy and radiation therapy with last chemotherapy 10/28 and radiation about a week ago, hypothyroidism, GERD, diverticulosis, HTN, HLD, who presents with weakness, diarrhea and nausea which has been going on for the last couple days. Imaging conering for possible colitits.  Pt in good spirits this morning, lives with husband at home. Pt reports feeling weaker and fatigued, walking has become increasingly difficult for her. Has had multiple loose stools in the past couple of days On exam pt with severe muscle and fat wasting. Pt also reports significant weight loss from 6 months ago d/t gallbladder being removed. Went from 160 lbs to now 118 lbs, 42 lbs  weight loss, 26% in 6 months. Has mostly been following a FLD since then as she will vomit solid food especially protein. Typically only eats 2 meals/day and drinks 1 Premier protein shake/day. In addition pt reports recently starting chemotherapy and radiation which has not helped her appetite. Denies recent vomiting, abdominal pain or taste changes.    Pt is severely malnourished. Encouraged increased po intake with small frequent meals. Pt willing to try Ensures. Also encouraged low fiber diet to help with colitis, went over [employer] Pt agreeable to  trying Banatrol. Pt only had Greek yogurt and a pack of goldfish this morning. Also had mild nausea. At refeeding risk.     Dietary recall: Breakfast: 1 Greek yogurt cup  Lunch: Chicken noodle soup Dinner: Chicken noodle soup Supplements: 1 Premier protein shake per day  Admit weight: 65.3 kg - Question accuracy? Current weight: 65.3 kg   Wt Readings from Last 10 Encounters:  12/22/23 65.3 kg  12/17/23 54.4 kg  12/11/23 54.5 kg  12/09/23 53.1 kg  12/03/23 54.4 kg  11/28/23 52.8 kg  11/25/23 53.8 kg  11/21/23 54.4 kg  11/20/23 53.5 kg  11/19/23 52.9 kg     Average Meal Intake: No meals recorded   Nutritionally Relevant Medications: Scheduled Meds:  cyanocobalamin   1,000 mcg Oral Daily   feeding supplement  237 mL Oral BID BM   [START ON 12/24/2023] ferrous sulfate   325 mg Oral Q breakfast   fiber supplement (BANATROL TF)  60 mL Oral BID   folic acid   1 mg Oral Daily  levothyroxine   100 mcg Oral Daily   multivitamin with minerals  1 tablet Oral Daily   potassium chloride   40 mEq Oral Once   thiamine  100 mg Oral Daily   Continuous Infusions:  cefTRIAXone (ROCEPHIN)  IV 200 mL/hr at 12/23/23 0434   dextrose  5% lactated ringers  125 mL/hr at 12/23/23 0556   Labs Reviewed: Potassium 3.0 AST 14 CBG ranges from 100-117 mg/dL over the last 24 hours HgbA1c 5.9  NUTRITION - FOCUSED PHYSICAL EXAM:  Flowsheet Row Most Recent  Value  Orbital Region Moderate depletion  Upper Arm Region Severe depletion  Thoracic and Lumbar Region Severe depletion  Buccal Region Moderate depletion  Temple Region Moderate depletion  Clavicle Bone Region Severe depletion  Clavicle and Acromion Bone Region Severe depletion  Scapular Bone Region Moderate depletion  Dorsal Hand Moderate depletion  Patellar Region Severe depletion  Anterior Thigh Region Severe depletion  Posterior Calf Region Severe depletion  Edema (RD Assessment) None  Hair Reviewed  Eyes Reviewed  Mouth Reviewed  Skin Reviewed  Nails Reviewed    Diet Order:   Diet Order             Diet Heart Room service appropriate? Yes; Fluid consistency: Thin  Diet effective now                   EDUCATION NEEDS:   Education needs have been addressed  Skin:  Skin Assessment: Reviewed RN Assessment  Last BM:  PTA  Height:   Ht Readings from Last 1 Encounters:  12/22/23 5' (1.524 m)    Weight:   Wt Readings from Last 1 Encounters:  12/22/23 65.3 kg    Ideal Body Weight:  45.5 kg  BMI:  Body mass index is 28.12 kg/m.  Estimated Nutritional Needs:   Kcal:  1700-1900 kcal  Protein:  90-110 gm  Fluid:  >1.7L/day   Olivia Kenning, RD Registered Dietitian  See Amion for more information

## 2023-12-23 NOTE — Plan of Care (Signed)

## 2023-12-23 NOTE — TOC CM/SW Note (Signed)
 Transition of Care [facility]) - Inpatient Brief Assessment   Patient Details  Name: Linda Woods MRN: [id number] Date of Birth: 22-Apr-1950  Transition of Care [facility]) CM/SW Contact:    Lauraine FORBES Saa, LCSWA Phone Number: 12/23/2023, 9:13 AM   Clinical Narrative:  9:13 AM Per chart review, patient resides at home with spouse. Patient has a PCP and insurance. Patient does not have SNF history. Patient has HH history with [facility]. Patient has DME (RW, cane, BSC, CPM) history. Patient's preferred pharmacy's are [facility] [place] and [facility]. No TOC needs identified at this time. TOC will continue to follow.  Transition of Care Asessment: Insurance and Status: Insurance coverage has been reviewed Patient has primary care physician: Yes Home environment has been reviewed: Private Residence Prior level of function:: N/A Prior/Current Home Services: No current home services Social Drivers of Health Review: SDOH reviewed no interventions necessary Readmission risk has been reviewed: Yes (Currently Yellow 20%) Transition of care needs: no transition of care needs at this time

## 2023-12-23 NOTE — Telephone Encounter (Signed)
 Received a my chart message from pt. that she is admitted to hospital for diarrhea and dehydration. Appt.s 12/23/23 cancelled and next appt. scheduled for 01/02/24.

## 2023-12-23 NOTE — Progress Notes (Signed)
 Provider Howerter notified of Hemoglobin 6.8, and he is putting orders in for transfusion

## 2023-12-23 NOTE — Progress Notes (Signed)
Sent stool sample to lab

## 2023-12-23 NOTE — Progress Notes (Signed)
 [facility] Progress Note Patient: Linda Woods FMW:[id number] DOB: November 12, 1950  DOA: 12/22/2023 DOS: the patient was seen and examined on 12/23/2023  Brief Hospital Course: Patient with PMH of non-small cell lung cancer stage III on chemoradiation, last chemotherapy 5 days ago and radiation about 1 week ago, hypothyroidism, GERD, HTN, HLD present to the hospital with complaints of nausea and diarrhea ongoing for last 3 days. Currently being treated for C. difficile colitis.  Assessment and Plan: Sepsis due to C. difficile colitis. Patient present to the hospital with complaints of diarrhea. Meet SIRS criteria with tachycardia, hypotension and leukopenia with evidence of colitis on CT scan. GI pathogen panel is negative.  C. difficile is positive for toxigenic PCR. At present will initiate antibiotic therapy for C. difficile. Patient was initiated on ceftriaxone and Flagyl which I will discontinue. Monitor for improvement in diarrhea. If the patient spikes temperature or clinically deteriorates, will add broad-spectrum antibiotic again.  Chemotherapy-induced pancytopenia. WBC 0.4 absolute neutrophil count 0.3. Hemoglobin 9.6 dropped down to 7.6. Platelet count 94 dropped down to 53 right now. At present patient is receiving 1 PRBC transfusion. Continue supportive measures with transfusion for hemoglobin less than 7 or hemodynamic instability/hypotension. Also monitor ANC. If no improvement will consult oncology for assistance. Continue to monitor platelet counts as well.  Transfuse platelet below 10,000 or any active bleeding.  Hypokalemia. Being replaced.  GERD. Continuing Pepcid .  Hypothyroidism. Continuing Synthroid .  HTN. Metoprolol . Currently on hold.  Stage III non-small cell lung cancer. Chemoradiation. Presenting with chemotherapy-induced pancytopenia and C. difficile colitis. On Tagrisso  which I will hold for now.  HLD. On statin.  Currently on  hold.  Chronic hyponatremia. Sodium level currently stable. On sodium and chloride tablets.  Currently on hold.  Severe protein calorie malnutrition Body mass index is 28.12 kg/m.  Placing the patient at high risk for poor outcome. Continue dietary supplements.  Subjective: Denies any acute complaint.  No nausea no vomiting.  No abdominal pain at the time of my evaluation.  Had 2 BMs so far.  No fever no chills.  No cough.  No rash.  Physical Exam: Clear to auscultation. S1-S2 present abdomen bowel sounds present.  Nontender. No edema.  Data Reviewed: I have Reviewed nursing notes, Vitals, and Lab results. Since last encounter, pertinent lab results CBC and BMP   . I have ordered test including CBC and BMP  .   Disposition: Status is: Inpatient Remains inpatient appropriate because: Monitor for stability of hemoglobin and infection improvement  Family Communication: No one at bedside Level of care: Telemetry switch from MedSurg to telemetry. Vitals:   12/23/23 1335 12/23/23 1350 12/23/23 1435 12/23/23 1715  BP: 94/63 100/60 (!) 90/59 122/70  Pulse: 83 82 78 99  Resp: 17 16  16   Temp: 98 F (36.7 C) 97.9 F (36.6 C) 97.9 F (36.6 C) 98.4 F (36.9 C)  TempSrc: Oral  Oral Oral  SpO2:   99% 99%  Weight:      Height:         Author: Yetta Blanch, MD 12/23/2023 6:28 PM  Please look on [email or web address] to find out who is on call.

## 2023-12-24 ENCOUNTER — Other Ambulatory Visit (HOSPITAL_COMMUNITY): Payer: Self-pay

## 2023-12-24 ENCOUNTER — Telehealth (HOSPITAL_COMMUNITY): Payer: Self-pay | Admitting: Pharmacy Technician

## 2023-12-24 ENCOUNTER — Other Ambulatory Visit: Payer: Self-pay

## 2023-12-24 ENCOUNTER — Encounter (HOSPITAL_COMMUNITY): Payer: Self-pay

## 2023-12-24 DIAGNOSIS — K529 Noninfective gastroenteritis and colitis, unspecified: Secondary | ICD-10-CM | POA: Diagnosis not present

## 2023-12-24 LAB — CBC WITH DIFFERENTIAL/PLATELET
Abs Immature Granulocytes: 0.01 K/uL (ref 0.00–0.07)
Basophils Absolute: 0 K/uL (ref 0.0–0.1)
Basophils Relative: 0 %
Eosinophils Absolute: 0 K/uL (ref 0.0–0.5)
Eosinophils Relative: 5 %
HCT: 25.1 % — ABNORMAL LOW (ref 36.0–46.0)
Hemoglobin: 8.6 g/dL — ABNORMAL LOW (ref 12.0–15.0)
Immature Granulocytes: 2 %
Lymphocytes Relative: 34 %
Lymphs Abs: 0.2 K/uL — ABNORMAL LOW (ref 0.7–4.0)
MCH: 32.6 pg (ref 26.0–34.0)
MCHC: 34.3 g/dL (ref 30.0–36.0)
MCV: 95.1 fL (ref 80.0–100.0)
Monocytes Absolute: 0 K/uL — ABNORMAL LOW (ref 0.1–1.0)
Monocytes Relative: 5 %
Neutro Abs: 0.3 K/uL — CL (ref 1.7–7.7)
Neutrophils Relative %: 54 %
Platelets: 35 K/uL — ABNORMAL LOW (ref 150–400)
RBC: 2.64 MIL/uL — ABNORMAL LOW (ref 3.87–5.11)
RDW: 14.8 % (ref 11.5–15.5)
WBC: 0.6 K/uL — CL (ref 4.0–10.5)
nRBC: 0 % (ref 0.0–0.2)

## 2023-12-24 LAB — TYPE AND SCREEN
ABO/RH(D): A POS
Antibody Screen: NEGATIVE
Unit division: 0

## 2023-12-24 LAB — RENAL FUNCTION PANEL
Albumin: 1.9 g/dL — ABNORMAL LOW (ref 3.5–5.0)
Anion gap: 6 (ref 5–15)
BUN: 7 mg/dL — ABNORMAL LOW (ref 8–23)
CO2: 20 mmol/L — ABNORMAL LOW (ref 22–32)
Calcium: 8 mg/dL — ABNORMAL LOW (ref 8.9–10.3)
Chloride: 112 mmol/L — ABNORMAL HIGH (ref 98–111)
Creatinine, Ser: 0.64 mg/dL (ref 0.44–1.00)
GFR, Estimated: >60 mL/min (ref >60)
Glucose, Bld: 94 mg/dL (ref 70–99)
Phosphorus: 2.2 mg/dL — ABNORMAL LOW (ref 2.5–4.6)
Potassium: 4.1 mmol/L (ref 3.5–5.1)
Sodium: 138 mmol/L (ref 135–145)

## 2023-12-24 LAB — PATHOLOGIST SMEAR REVIEW: Path Review: NEGATIVE

## 2023-12-24 LAB — MAGNESIUM: Magnesium: 1.4 mg/dL — ABNORMAL LOW (ref 1.7–2.4)

## 2023-12-24 LAB — BPAM RBC
Blood Product Expiration Date: 202511222359
ISSUE DATE / TIME: 202511031325
Unit Type and Rh: 6200

## 2023-12-24 MED ORDER — ALBUTEROL SULFATE (2.5 MG/3ML) 0.083% IN NEBU: 2.5000 mg | INHALATION_SOLUTION | RESPIRATORY_TRACT | Status: DC | PRN

## 2023-12-24 MED ORDER — FIDAXOMICIN 200 MG PO TABS
200.0000 mg | ORAL_TABLET | Freq: Two times a day (BID) | ORAL | Status: DC
  Administered 2023-12-24 – 2023-12-27 (×7): 200 mg via ORAL
  Filled 2023-12-24 (×8): qty 1

## 2023-12-24 MED ORDER — FILGRASTIM-AAFI 300 MCG/0.5ML IJ SOSY
300.0000 ug | PREFILLED_SYRINGE | Freq: Every day | INTRAMUSCULAR | Status: DC
  Administered 2023-12-24 – 2023-12-26 (×3): 300 ug via SUBCUTANEOUS
  Filled 2023-12-24 (×4): qty 0.5

## 2023-12-24 MED ORDER — MAGNESIUM SULFATE 4 GM/100ML IV SOLN
4.0000 g | Freq: Once | INTRAVENOUS | Status: AC
  Administered 2023-12-24: 4 g via INTRAVENOUS
  Filled 2023-12-24: qty 100

## 2023-12-24 MED ORDER — METHENAMINE MANDELATE 0.5 G PO TABS
1.0000 g | ORAL_TABLET | Freq: Two times a day (BID) | ORAL | Status: DC
  Administered 2023-12-24 – 2023-12-27 (×7): 1 g via ORAL
  Filled 2023-12-24 (×8): qty 2

## 2023-12-24 MED ORDER — SODIUM PHOSPHATES 45 MMOLE/15ML IV SOLN
30.0000 mmol | Freq: Once | INTRAVENOUS | Status: AC
  Administered 2023-12-24: 30 mmol via INTRAVENOUS
  Filled 2023-12-24: qty 10

## 2023-12-24 NOTE — Telephone Encounter (Signed)
 Patient Product/process development scientist completed.    The patient is insured through [facility]. Patient has Medicare and is not eligible for a copay card, but may be able to apply for patient assistance or Medicare RX Payment Plan (Patient Must reach out to their plan, if eligible for payment plan), if available.    Ran test claim for Dificid 200 mg and the current 10 day co-pay is $0.00.   This test claim was processed through [facility]- copay amounts may vary at other pharmacies due to pharmacy/plan contracts, or as the patient moves through the different stages of their insurance plan.     Reyes Sharps, CPHT Pharmacy Technician Patient Advocate Specialist Lead [facility] Patient Advocate Team Direct Number: [phone]  Fax: [phone]

## 2023-12-24 NOTE — Plan of Care (Signed)

## 2023-12-24 NOTE — Progress Notes (Signed)
 [facility] Progress Note Patient: Baylynn Shifflett FMW:[id number] DOB: [birth year]  DOA: 12/22/2023 DOS: the patient was seen and examined on 12/24/2023  Brief Hospital Course: Patient with PMH of non-small cell lung cancer stage III on chemoradiation, last chemotherapy 5 days ago and radiation about 1 week ago, hypothyroidism, GERD, HTN, HLD present to the hospital with complaints of nausea and diarrhea ongoing for last 3 days. Currently being treated for C. difficile colitis.  Assessment and Plan: Sepsis due to C. difficile colitis. Patient present to the hospital with complaints of diarrhea. Meet SIRS criteria with tachycardia, hypotension and leukopenia with evidence of colitis on CT scan. GI pathogen panel is negative.  C. difficile is positive for toxigenic PCR. Initially started on vancomycin , currently on for Doxy mycin. Patient was initiated on ceftriaxone and Flagyl which I will discontinue. Monitor for improvement in diarrhea. If the patient spikes temperature or clinically deteriorates, will add broad-spectrum antibiotic again.   Chemotherapy-induced pancytopenia. Absolute neutropenia, anemia thrombocytopenia. Received 1 PRBC transfusion for hemoglobin of 6.8 H&H relatively stable. Transfuse for hemoglobin less than 7 or hemodynamic instability.  Platelet count currently trending down. Transfuse for platelet less than 20,000 or active bleeding.  ANC 300.  Discussed with hematology.  Initiating Granix until her ANC is more than 1500.   Hypokalemia. Replaced.   GERD. Continuing Pepcid .   Hypothyroidism. Continuing Synthroid .   HTN. On Metoprolol . Currently on hold.   Stage III non-small cell lung cancer. On active chemoradiation. Presenting with chemotherapy-induced pancytopenia and C. difficile colitis. On Tagrisso  which I will hold for now.   HLD. On statin.  Currently on hold.   Chronic hyponatremia. Sodium level currently stable. On sodium and  chloride tablets.  Currently on hold.   Severe protein calorie malnutrition Body mass index is 28.12 kg/m.  Placing the patient at high risk for poor outcome. Continue dietary supplements.  Subjective: No nausea no vomiting no fever no chills.  Still has diarrhea.  Had 3-4 bowel movements yesterday.  No blood in the stool.  Physical Exam: Mild expiratory wheezing. S1-S2 present Bowel sound present.  Nontender. No edema.  Data Reviewed: I have Reviewed nursing notes, Vitals, and Lab results. Since last encounter, pertinent lab results CBC and BMP   . I have ordered test including CBC and BMP  . I have discussed pt's care plan and test results with hematology  .   Disposition: Status is: Inpatient Remains inpatient appropriate because: Monitor for improvement in diarrhea Family Communication: No one at bedside Level of care: Telemetry   Vitals:   12/24/23 0459 12/24/23 0830 12/24/23 1117 12/24/23 1522  BP: 104/76 102/69 110/79 112/73  Pulse: 93 78 93 79  Resp: 18 18 18 18   Temp: 98 F (36.7 C) 98.1 F (36.7 C) 99.1 F (37.3 C) (!) 97 F (36.1 C)  TempSrc: Oral     SpO2: 99% 99% 100% 100%  Weight:      Height:         Author: Yetta Blanch, MD 12/24/2023 5:59 PM  Please look on [email or web address] to find out who is on call.

## 2023-12-25 ENCOUNTER — Telehealth: Payer: Self-pay

## 2023-12-25 DIAGNOSIS — E871 Hypo-osmolality and hyponatremia: Secondary | ICD-10-CM

## 2023-12-25 DIAGNOSIS — D61818 Other pancytopenia: Secondary | ICD-10-CM

## 2023-12-25 DIAGNOSIS — E039 Hypothyroidism, unspecified: Secondary | ICD-10-CM | POA: Diagnosis not present

## 2023-12-25 DIAGNOSIS — I10 Essential (primary) hypertension: Secondary | ICD-10-CM

## 2023-12-25 DIAGNOSIS — C3492 Malignant neoplasm of unspecified part of left bronchus or lung: Secondary | ICD-10-CM | POA: Diagnosis not present

## 2023-12-25 DIAGNOSIS — T451X5A Adverse effect of antineoplastic and immunosuppressive drugs, initial encounter: Secondary | ICD-10-CM

## 2023-12-25 DIAGNOSIS — D701 Agranulocytosis secondary to cancer chemotherapy: Secondary | ICD-10-CM

## 2023-12-25 DIAGNOSIS — K529 Noninfective gastroenteritis and colitis, unspecified: Secondary | ICD-10-CM | POA: Diagnosis not present

## 2023-12-25 LAB — RENAL FUNCTION PANEL
Albumin: 2.1 g/dL — ABNORMAL LOW (ref 3.5–5.0)
Anion gap: 10 (ref 5–15)
BUN: 7 mg/dL — ABNORMAL LOW (ref 8–23)
CO2: 21 mmol/L — ABNORMAL LOW (ref 22–32)
Calcium: 7.9 mg/dL — ABNORMAL LOW (ref 8.9–10.3)
Chloride: 104 mmol/L (ref 98–111)
Creatinine, Ser: 0.59 mg/dL (ref 0.44–1.00)
GFR, Estimated: >60 mL/min (ref >60)
Glucose, Bld: 87 mg/dL (ref 70–99)
Phosphorus: 3.9 mg/dL (ref 2.5–4.6)
Potassium: 3.4 mmol/L — ABNORMAL LOW (ref 3.5–5.1)
Sodium: 135 mmol/L (ref 135–145)

## 2023-12-25 LAB — CBC WITH DIFFERENTIAL/PLATELET
Abs Immature Granulocytes: 0.01 K/uL (ref 0.00–0.07)
Basophils Absolute: 0 K/uL (ref 0.0–0.1)
Basophils Relative: 0 %
Eosinophils Absolute: 0 K/uL (ref 0.0–0.5)
Eosinophils Relative: 4 %
HCT: 25.9 % — ABNORMAL LOW (ref 36.0–46.0)
Hemoglobin: 9 g/dL — ABNORMAL LOW (ref 12.0–15.0)
Immature Granulocytes: 2 %
Lymphocytes Relative: 37 %
Lymphs Abs: 0.2 K/uL — ABNORMAL LOW (ref 0.7–4.0)
MCH: 32 pg (ref 26.0–34.0)
MCHC: 34.7 g/dL (ref 30.0–36.0)
MCV: 92.2 fL (ref 80.0–100.0)
Monocytes Absolute: 0.1 K/uL (ref 0.1–1.0)
Monocytes Relative: 13 %
Neutro Abs: 0.2 K/uL — CL (ref 1.7–7.7)
Neutrophils Relative %: 44 %
Platelets: 25 K/uL — CL (ref 150–400)
RBC: 2.81 MIL/uL — ABNORMAL LOW (ref 3.87–5.11)
RDW: 14.3 % (ref 11.5–15.5)
WBC: 0.5 K/uL — CL (ref 4.0–10.5)
nRBC: 0 % (ref 0.0–0.2)

## 2023-12-25 LAB — MAGNESIUM: Magnesium: 2 mg/dL (ref 1.7–2.4)

## 2023-12-25 NOTE — Progress Notes (Signed)
 Progress Note   Patient: Linda Woods FMW:[id number] DOB: [birth year] DOA: 12/22/2023     3 DOS: the patient was seen and examined on 12/25/2023   Brief hospital course: Patient with PMH of non-small cell lung cancer stage III on chemoradiation, last chemotherapy 5 days ago and radiation about 1 week ago, hypothyroidism, GERD, HTN, HLD present to the hospital with complaints of nausea and diarrhea ongoing for last 3 days. Currently being treated for C. difficile colitis.  Her diarrhea did improve. Patient does have pancytopenia with ANC 0.2.  Patient did receive 1 unit PRBC. Patient is started on granisetron per oncology recommendation and is currently awaiting for improved counts.  Assessment and Plan: Sepsis due to C. difficile colitis- Presented with complaints of diarrhea, GI pathogen panel negative.  C. difficile positive for toxigenic PCR. Initially started on vancomycin , currently on fidaxomicin therapy. Patient's diarrhea much better today. Will continue fidaxomicin for 10-day therapy.  Pancytopenia due to chemotherapy. Absolute neutropenia Anemia and thrombocytopenia. Patient did receive 1 unit of PRBC for hemoglobin 6.8. Platelet count today 25. H&H stable. Patient is started on Granix per oncology recommendations.  As patient wished to go home I discussed with Dr. Sherrod who advised for her to stay until [facility] greater than thousand.  I discussed with patient regarding her low neutrophil count, discussed the need for granisetron therapy, advised to stay for follow-up of counts.  She understands.  Hypokalemia-replaced.  GERD-continue Pepcid .  Hypothyroidism-continue Synthroid .  Hypertension metoprolol  on hold due to low blood pressures.  Stage III non-small cell lung cancer-patient is currently on chemoradiation.  She will follow Dr. Sherrod for resuming chemotherapy.  Chronic hyponatremia-stable on salt tablets.  Protein calorie malnutrition-continue  supplementation.      Out of bed to chair. Incentive spirometry. Nursing supportive care. Fall, aspiration precautions. Diet:  Diet Orders (From admission, onward)     Start     Ordered   12/23/23 1154  Diet regular Room service appropriate? Yes; Fluid consistency: Thin  Diet effective now       Question Answer Comment  Room service appropriate? Yes   Fluid consistency: Thin      12/23/23 1153           DVT prophylaxis:   Level of care: Telemetry   Code Status: Full Code  Subjective: Patient is seen and examined today morning.  She wishes to go home.  I advised her to stay for Granix therapy for improvement of white count.  Patient's friend at bedside.  Physical Exam: Vitals:   12/25/23 0508 12/25/23 0813 12/25/23 1154 12/25/23 1632  BP: 111/76 112/75 112/83 120/76  Pulse: 92 88 (!) 110 (!) 106  Resp: 18     Temp: 98.5 F (36.9 C) 97.7 F (36.5 C)  98.4 F (36.9 C)  TempSrc: Oral Oral  Oral  SpO2: 98% 100% 99% 100%  Weight:      Height:        General - Elderly Caucasian female, no apparent distress HEENT - PERRLA, EOMI, atraumatic head, non tender sinuses. Lung - Clear, no rales, rhonchi, wheezes. Heart - S1, S2 heard, no murmurs, rubs, trace pedal edema. Abdomen - Soft, non tender, bowel sounds good Neuro - Alert, awake and oriented x 3, non focal exam. Skin - Warm and dry.  Data Reviewed:      Latest Ref Rng & Units 12/25/2023    4:02 AM 12/24/2023    4:32 AM 12/23/2023    7:05 PM  CBC  WBC  4.0 - 10.5 K/uL 0.5  0.6  0.6   Hemoglobin 12.0 - 15.0 g/dL 9.0  8.6  8.8   Hematocrit 36.0 - 46.0 % 25.9  25.1  26.3   Platelets 150 - 400 K/uL 25  35  43       Latest Ref Rng & Units 12/25/2023    4:02 AM 12/24/2023    4:32 AM 12/23/2023    4:20 AM  BMP  Glucose 70 - 99 mg/dL 87  94  882   BUN 8 - 23 mg/dL 7  7  11    Creatinine 0.44 - 1.00 mg/dL 9.40  9.35  9.28   Sodium 135 - 145 mmol/L 135  138  135   Potassium 3.5 - 5.1 mmol/L 3.4  4.1  3.0    Chloride 98 - 111 mmol/L 104  112  105   CO2 22 - 32 mmol/L 21  20  20    Calcium  8.9 - 10.3 mg/dL 7.9  8.0  7.3    No results found.  Family Communication: Discussed with patient, friend at bedside.  They understand and agree. All questions answered.  Disposition: Status is: Inpatient Remains inpatient appropriate because: Absolute neutropenia on Granix, C. difficile colitis  Planned Discharge Destination: Home     Time spent: 43 minutes  Author: Concepcion Riser, MD 12/25/2023 5:01 PM Secure chat 7am to 7pm For on call review [email or web address].

## 2023-12-25 NOTE — Evaluation (Signed)
 Physical Therapy Evaluation Patient Details Name: Linda Woods MRN: [id number] DOB: 12/02/1950 Today's Date: 12/25/2023  History of Present Illness  73 y.o. female presents to [facility] hospital on 12/22/2023 with nausea and diarrhea. Pt admitted for management of Cdiff, also with pancytopenia due to recent chemotherapy. PMH includes NSCLC, hypothyroidism, GERD, HTN, HLD.  Clinical Impression  Pt presents to PT with deficits in strength, power, gait, balance, cardiopulmonary function and endurance. Pt is able to ambulate with hand hold from PT, utilizing UE support to reduce lateral drift and improve stability. Pt is tachycardic when mobilizing but denies symptoms other than fatigue. Pt has a rollator at home and is encouraged to utilize this at the time of discharge until she is able to improve her balance and endurance. HHPT is recommended as well.        If plan is discharge home, recommend the following: A little help with walking and/or transfers;A little help with bathing/dressing/bathroom;Assistance with cooking/housework;Assist for transportation;Help with stairs or ramp for entrance   Can travel by private vehicle        Equipment Recommendations None recommended by PT  Recommendations for Other Services       Functional Status Assessment Patient has had a recent decline in their functional status and demonstrates the ability to make significant improvements in function in a reasonable and predictable amount of time.     Precautions / Restrictions Precautions Precautions: Fall Recall of Precautions/Restrictions: Intact Precaution/Restrictions Comments: pancytopenia, tachycardia Restrictions Weight Bearing Restrictions Per Provider Order: No      Mobility  Bed Mobility Overal bed mobility: Independent                  Transfers Overall transfer level: Independent Equipment used: None                    Ambulation/Gait Ambulation/Gait assistance:  Contact guard assist Gait Distance (Feet): 150 Feet Assistive device: 1 person hand held assist Gait Pattern/deviations: Step-through pattern Gait velocity: reduced Gait velocity interpretation: 1.31 - 2.62 ft/sec, indicative of limited community ambulator   General Gait Details: slowed step-through gait, mild increase in lateral drift which pt corrects with UE support of this PT  Stairs            Wheelchair Mobility     Tilt Bed    Modified Rankin (Stroke Patients Only)       Balance Overall balance assessment: Needs assistance Sitting-balance support: No upper extremity supported, Feet supported Sitting balance-Leahy Scale: Good     Standing balance support: Single extremity supported, Reliant on assistive device for balance Standing balance-Leahy Scale: Poor                               Pertinent Vitals/Pain Pain Assessment Pain Assessment: No/denies pain    Home Living Family/patient expects to be discharged to:: Private residence Living Arrangements: Spouse/significant other Available Help at Discharge: Family Type of Home: House Home Access: Stairs to enter Entrance Stairs-Rails: None Entrance Stairs-Number of Steps: 1 + 1   Home Layout: Multi-level;Able to live on main level with bedroom/bathroom Home Equipment: [place] - single point;Cane - quad;Shower seat - built in;Rollator (4 wheels)      Prior Function Prior Level of Function : Independent/Modified Independent             Mobility Comments: ambulatory without DME typically, was utilizing a rollator recently after getting sick while on vacation  Extremity/Trunk Assessment   Upper Extremity Assessment Upper Extremity Assessment: Overall WFL for tasks assessed    Lower Extremity Assessment Lower Extremity Assessment: Generalized weakness    Cervical / Trunk Assessment Cervical / Trunk Assessment: Normal  Communication   Communication Communication: No apparent  difficulties    Cognition Arousal: Alert Behavior During Therapy: WFL for tasks assessed/performed   PT - Cognitive impairments: No apparent impairments                         Following commands: Intact       Cueing Cueing Techniques: Verbal cues     General Comments General comments (skin integrity, edema, etc.): pt with tachycardia in 110s at rest and up to 150 with ambulation    Exercises     Assessment/Plan    PT Assessment Patient needs continued PT services  PT Problem List Decreased strength;Decreased activity tolerance;Decreased mobility;Decreased balance;Cardiopulmonary status limiting activity       PT Treatment Interventions DME instruction;Gait training;Stair training;Functional mobility training;Therapeutic activities;Therapeutic exercise;Neuromuscular re-education;Balance training;Patient/family education    PT Goals (Current goals can be found in the Care Plan section)  Acute Rehab PT Goals Patient Stated Goal: to return home PT Goal Formulation: With patient Time For Goal Achievement: 01/08/24 Potential to Achieve Goals: Fair Additional Goals Additional Goal #1: Pt will score >19/24 on the DGI to indicate a reduced risk for falls    Frequency Min 2X/week     Co-evaluation               AM-PAC PT 6 Clicks Mobility  Outcome Measure Help needed turning from your back to your side while in a flat bed without using bedrails?: None Help needed moving from lying on your back to sitting on the side of a flat bed without using bedrails?: None Help needed moving to and from a bed to a chair (including a wheelchair)?: None Help needed standing up from a chair using your arms (e.g., wheelchair or bedside chair)?: None Help needed to walk in hospital room?: A Little Help needed climbing 3-5 steps with a railing? : A Little 6 Click Score: 22    End of Session   Activity Tolerance: Patient tolerated treatment well Patient left: in bed;with  call bell/phone within reach Nurse Communication: Mobility status PT Visit Diagnosis: Other abnormalities of gait and mobility (R26.89);Muscle weakness (generalized) (M62.81)    Time: 8254-8241 PT Time Calculation (min) (ACUTE ONLY): 13 min   Charges:   PT Evaluation $PT Eval Low Complexity: 1 Low   PT General Charges $$ ACUTE PT VISIT: 1 Visit         Bernardino JINNY Ruth, PT, DPT Acute Rehabilitation Office [phone]   Bernardino JINNY Ruth 12/25/2023, 6:01 PM

## 2023-12-25 NOTE — Telephone Encounter (Signed)
 Received call from patient requesting Dr Sherrod allow her to leave the hospital. Notified Dr Sherrod that Dr Darci is attempting to get into contact with him regarding the pt, Per Dr Sherrod he will contact the hospitalist and advise. Pt aware that Dr Sherrod and Dr Darci will be in contact to discuss the plan for her.

## 2023-12-26 ENCOUNTER — Inpatient Hospital Stay

## 2023-12-26 ENCOUNTER — Other Ambulatory Visit: Payer: Self-pay

## 2023-12-26 DIAGNOSIS — E039 Hypothyroidism, unspecified: Secondary | ICD-10-CM | POA: Diagnosis not present

## 2023-12-26 DIAGNOSIS — C3492 Malignant neoplasm of unspecified part of left bronchus or lung: Secondary | ICD-10-CM | POA: Diagnosis not present

## 2023-12-26 DIAGNOSIS — K529 Noninfective gastroenteritis and colitis, unspecified: Secondary | ICD-10-CM | POA: Diagnosis not present

## 2023-12-26 DIAGNOSIS — I10 Essential (primary) hypertension: Secondary | ICD-10-CM | POA: Diagnosis not present

## 2023-12-26 LAB — CBC WITH DIFFERENTIAL/PLATELET
Abs Immature Granulocytes: 0.01 K/uL (ref 0.00–0.07)
Basophils Absolute: 0 K/uL (ref 0.0–0.1)
Basophils Relative: 1 %
Eosinophils Absolute: 0 K/uL (ref 0.0–0.5)
Eosinophils Relative: 1 %
HCT: 24.2 % — ABNORMAL LOW (ref 36.0–46.0)
Hemoglobin: 8.3 g/dL — ABNORMAL LOW (ref 12.0–15.0)
Immature Granulocytes: 1 %
Lymphocytes Relative: 11 %
Lymphs Abs: 0.1 K/uL — ABNORMAL LOW (ref 0.7–4.0)
MCH: 31.7 pg (ref 26.0–34.0)
MCHC: 34.3 g/dL (ref 30.0–36.0)
MCV: 92.4 fL (ref 80.0–100.0)
Monocytes Absolute: 0.1 K/uL (ref 0.1–1.0)
Monocytes Relative: 9 %
Neutro Abs: 1 K/uL — ABNORMAL LOW (ref 1.7–7.7)
Neutrophils Relative %: 77 %
Platelets: 17 K/uL — CL (ref 150–400)
RBC: 2.62 MIL/uL — ABNORMAL LOW (ref 3.87–5.11)
RDW: 14 % (ref 11.5–15.5)
WBC: 1.3 K/uL — CL (ref 4.0–10.5)
nRBC: 0 % (ref 0.0–0.2)

## 2023-12-26 LAB — RENAL FUNCTION PANEL
Albumin: 2.1 g/dL — ABNORMAL LOW (ref 3.5–5.0)
Anion gap: 11 (ref 5–15)
BUN: 10 mg/dL (ref 8–23)
CO2: 23 mmol/L (ref 22–32)
Calcium: 7.6 mg/dL — ABNORMAL LOW (ref 8.9–10.3)
Chloride: 105 mmol/L (ref 98–111)
Creatinine, Ser: 0.66 mg/dL (ref 0.44–1.00)
GFR, Estimated: >60 mL/min (ref >60)
Glucose, Bld: 130 mg/dL — ABNORMAL HIGH (ref 70–99)
Phosphorus: 2.2 mg/dL — ABNORMAL LOW (ref 2.5–4.6)
Potassium: 3.3 mmol/L — ABNORMAL LOW (ref 3.5–5.1)
Sodium: 139 mmol/L (ref 135–145)

## 2023-12-26 LAB — MAGNESIUM: Magnesium: 1.6 mg/dL — ABNORMAL LOW (ref 1.7–2.4)

## 2023-12-26 MED ORDER — PHENAZOPYRIDINE HCL 100 MG PO TABS
100.0000 mg | ORAL_TABLET | Freq: Three times a day (TID) | ORAL | Status: DC
  Filled 2023-12-26: qty 1

## 2023-12-26 MED ORDER — PHENAZOPYRIDINE HCL 100 MG PO TABS
100.0000 mg | ORAL_TABLET | Freq: Three times a day (TID) | ORAL | Status: DC
  Administered 2023-12-26 (×2): 100 mg via ORAL
  Filled 2023-12-26 (×5): qty 1

## 2023-12-26 MED ORDER — SODIUM CHLORIDE 0.9% IV SOLUTION: Freq: Once | INTRAVENOUS | Status: AC

## 2023-12-26 MED ORDER — POTASSIUM CHLORIDE 20 MEQ PO PACK
40.0000 meq | PACK | Freq: Every day | ORAL | Status: DC
  Administered 2023-12-26 – 2023-12-27 (×2): 40 meq via ORAL
  Filled 2023-12-26 (×2): qty 2

## 2023-12-26 NOTE — Progress Notes (Signed)
 Mobility Specialist: Progress Note   12/26/23 1047  Mobility  Activity Ambulated with assistance  Level of Assistance Contact guard assist, steadying assist  Assistive Device Other (Comment) (HHA)  Distance Ambulated (ft) 100 ft  Activity Response Tolerated well  Mobility Referral Yes  Mobility visit 1 Mobility  Mobility Specialist Start Time (ACUTE ONLY) 0915  Mobility Specialist Stop Time (ACUTE ONLY) 0935  Mobility Specialist Time Calculation (min) (ACUTE ONLY) 20 min    Pt received in bed, pleasant and agreeable to mobility session. SV for bed mobility. Requesting to go to BR first to change brief. Bed linen changed while pt completed pericare ind. Completed hallway ambulation with HHA, but was able to ambulate around the room with SV mostly. Mild unsteadiness during return ambulation d/t fatigue/weakness. Returned pt to bed. Left in bed with all needs met, call bell in reach.   Ileana Lute Mobility Specialist Please contact via SecureChat or Rehab office at [phone]

## 2023-12-26 NOTE — Plan of Care (Signed)

## 2023-12-26 NOTE — Progress Notes (Signed)
 Progress Note   Patient: Linda Woods FMW:[id number] DOB: 01-08-1951 DOA: 12/22/2023     4 DOS: the patient was seen and examined on 12/26/2023   Brief hospital course: Patient with PMH of non-small cell lung cancer stage III on chemoradiation, last chemotherapy 5 days ago and radiation about 1 week ago, hypothyroidism, GERD, HTN, HLD present to the hospital with complaints of nausea and diarrhea ongoing for last 3 days. Currently being treated for C. difficile colitis.  Her diarrhea did improve. Patient does have pancytopenia with ANC 0.2.  Patient did receive 1 unit PRBC. Patient is started on granisetron per oncology recommendation and is currently awaiting for improved counts.  Assessment and Plan: Sepsis due to C. difficile colitis- Presented with complaints of diarrhea, GI pathogen panel negative.  C. difficile positive for toxigenic PCR. Initially started on vancomycin , currently on fidaxomicin therapy. Patient's diarrhea much better today. Will continue fidaxomicin for 10-day therapy.  Pancytopenia due to chemotherapy. Absolute neutropenia Anemia and thrombocytopenia. Patient did receive 1 unit of PRBC for hemoglobin 6.8. WBC1.3, ANC 1.  Platelet count today 17 today, platelet transfusion ordered. H&H stable. Patient is started on Granix per oncology recommendations for ANC>1500.  Hypokalemia-daily replacement ordered.  GERD-continue Pepcid .  Hypothyroidism-continue Synthroid .  Hypertension metoprolol  on hold due to low blood pressures.  Stage III non-small cell lung cancer-patient is currently on chemoradiation.  She will follow Dr. Sherrod for resuming chemotherapy.  Chronic hyponatremia- Na 139. Takes salt tablets at home.  Protein calorie malnutrition-continue supplementation.      Out of bed to chair. Incentive spirometry. Nursing supportive care. Fall, aspiration precautions. Diet:  Diet Orders (From admission, onward)     Start     Ordered    12/23/23 1154  Diet regular Room service appropriate? Yes; Fluid consistency: Thin  Diet effective now       Question Answer Comment  Room service appropriate? Yes   Fluid consistency: Thin      12/23/23 1153           DVT prophylaxis:   Level of care: Telemetry   Code Status: Full Code  Subjective: Patient is seen and examined today morning.  She is weak, complained of burning pain with urine. Eating fair. Patient's friend at bedside. Wishes to go home.  Physical Exam: Vitals:   12/26/23 0527 12/26/23 0803 12/26/23 1015 12/26/23 1140  BP: 104/66 99/70 91/63  106/69  Pulse: 94 96  97  Resp: 18 18  18   Temp: 97.9 F (36.6 C) 98 F (36.7 C)  98 F (36.7 C)  TempSrc: Oral     SpO2: 99% 100%  100%  Weight:      Height:        General - Elderly Caucasian female, no apparent distress HEENT - PERRLA, EOMI, atraumatic head, non tender sinuses. Lung - Clear, no rales, rhonchi, wheezes. Heart - S1, S2 heard, no murmurs, rubs, trace pedal edema. Abdomen - Soft, non tender, bowel sounds good Neuro - Alert, awake and oriented x 3, non focal exam. Skin - Warm and dry.  Data Reviewed:      Latest Ref Rng & Units 12/26/2023    1:17 AM 12/25/2023    4:02 AM 12/24/2023    4:32 AM  CBC  WBC 4.0 - 10.5 K/uL 1.3  0.5  0.6   Hemoglobin 12.0 - 15.0 g/dL 8.3  9.0  8.6   Hematocrit 36.0 - 46.0 % 24.2  25.9  25.1   Platelets 150 - 400 K/uL  17  25  35       Latest Ref Rng & Units 12/26/2023    1:17 AM 12/25/2023    4:02 AM 12/24/2023    4:32 AM  BMP  Glucose 70 - 99 mg/dL 869  87  94   BUN 8 - 23 mg/dL 10  7  7    Creatinine 0.44 - 1.00 mg/dL 9.33  9.40  9.35   Sodium 135 - 145 mmol/L 139  135  138   Potassium 3.5 - 5.1 mmol/L 3.3  3.4  4.1   Chloride 98 - 111 mmol/L 105  104  112   CO2 22 - 32 mmol/L 23  21  20    Calcium  8.9 - 10.3 mg/dL 7.6  7.9  8.0    No results found.  Family Communication: Discussed with patient, friend at bedside.  They understand and agree. All  questions answered.  Disposition: Status is: Inpatient Remains inpatient appropriate because: monitor ANC on Granix, C. difficile colitis, platelet transfusion  Planned Discharge Destination: Home     Time spent: 44 minutes  Author: Concepcion Riser, MD 12/26/2023 12:31 PM Secure chat 7am to 7pm For on call review [email or web address].

## 2023-12-26 NOTE — Plan of Care (Signed)

## 2023-12-26 NOTE — Evaluation (Signed)
 Occupational Therapy Evaluation Patient Details Name: Linda Woods MRN: [id number] DOB: 1950/08/21 Today's Date: 12/26/2023   History of Present Illness   73 y.o. female presents to [facility] hospital on 12/22/2023 with nausea and diarrhea. Pt admitted for management of Cdiff, also with pancytopenia due to recent chemotherapy. PMH includes NSCLC, hypothyroidism, GERD, HTN, HLD.     Clinical Impressions At baseline, pt is Ind to Mod I with ADLs and light meal prep and performs functional mobility without an AD. However, pt reports recent use of Rollator. At baseline, pt receives assistance for transportation, cleaning, laundry, and other home management tasks. Pt now presents with significantly decreased activity tolerance, increased fatigue, generalized B UE weakness, and decreased safety and independence with functional tasks. Pt currently demonstrates ability to largely complete ADLs Ind to Contact guard assist for safety. Pt limited this session by fatigue. OT initiated education in and provided handout related to energy conservation. Pt will benefit from reinforcement of education. Pt reports her main goal is to increase strength and reports good support in the home from family and friends. Pt will benefit from acute OT services to address deficits and increase safety and independence with functional tasks. No post acute skilled OT needs are anticipated.      If plan is discharge home, recommend the following:   A little help with walking and/or transfers;A little help with bathing/dressing/bathroom;Assistance with cooking/housework;Assist for transportation;Help with stairs or ramp for entrance     Functional Status Assessment   Patient has had a recent decline in their functional status and demonstrates the ability to make significant improvements in function in a reasonable and predictable amount of time.     Equipment Recommendations   None recommended by OT (Pt already has  needed equipment)     Recommendations for Other Services         Precautions/Restrictions   Precautions Precautions: Fall;Other (comment) Recall of Precautions/Restrictions: Intact Precaution/Restrictions Comments: pancytopenia, tachycardia Restrictions Weight Bearing Restrictions Per Provider Order: No     Mobility Bed Mobility Overal bed mobility: Independent                  Transfers Overall transfer level: Needs assistance Equipment used: None, 1 person hand held assist Transfers: Sit to/from Stand Sit to Stand: Modified independent (Device/Increase time), Contact guard assist           General transfer comment: pt declined further mobility due to fatigue      Balance Overall balance assessment: Needs assistance Sitting-balance support: No upper extremity supported, Feet supported Sitting balance-Leahy Scale: Good     Standing balance support: Single extremity supported, Reliant on assistive device for balance, During functional activity Standing balance-Leahy Scale: Poor                             ADL either performed or assessed with clinical judgement   ADL Overall ADL's : Needs assistance/impaired;Modified independent;Independent Eating/Feeding: Independent;Sitting   Grooming: Modified independent;Sitting   Upper Body Bathing: Set up;Sitting   Lower Body Bathing: Set up;Supervison/ safety;Sitting/lateral leans;Contact guard assist;Sit to/from stand   Upper Body Dressing : Modified independent;Sitting   Lower Body Dressing: Supervision/safety;Contact guard assist;Sitting/lateral leans;Sit to/from Market Researcher Details (indicate cue type and reason): pt declined functional transfers/mobility this session secondary to fatigue           General ADL Comments: Pt functional level significantly affected by decreased activity tolerance. OT initiated  traiing in energy coservation strategies with handout provided and  with pt verbalizing understanding of training. Pt will benefit from reinforcement of education.     Vision Baseline Vision/History: 1 Wears glasses Ability to See in Adequate Light: 0 Adequate (with glasses on) Patient Visual Report: No change from baseline Vision Assessment?: No apparent visual deficits Additional Comments: Vision [facility] for tasks assessed with glasses on; not formally screened or evaluated     Perception         Praxis         Pertinent Vitals/Pain Pain Assessment Pain Assessment: 0-10 Pain Score: 5  Pain Location: lower back Pain Descriptors / Indicators: Aching, Discomfort Pain Intervention(s): Limited activity within patient's tolerance, Monitored during session, Repositioned, Other (comment) (Pt reports she does not want any pain medication at this time.)     Extremity/Trunk Assessment Upper Extremity Assessment Upper Extremity Assessment: Right hand dominant;Generalized weakness   Lower Extremity Assessment Lower Extremity Assessment: Defer to PT evaluation   Cervical / Trunk Assessment Cervical / Trunk Assessment: Other exceptions Cervical / Trunk Exceptions: lower back wound secondary to cancer/treatment   Communication Communication Communication: No apparent difficulties   Cognition Arousal: Alert Behavior During Therapy: WFL for tasks assessed/performed Cognition: No apparent impairments             OT - Cognition Comments: Pt AAOx4 with cognition WFL for tasks assessed. Cogntiion not formally screened or evaluated.                 Following commands: Intact       Cueing  General Comments   Cueing Techniques: Verbal cues  HR in the 90s throughout session. O2 sat >/93% on RA.   Exercises     Shoulder Instructions      Home Living Family/patient expects to be discharged to:: Private residence Living Arrangements: Spouse/significant other Available Help at Discharge: Family Type of Home: House Home Access: Stairs to  enter [employer] of Steps: 1 + 1 Entrance Stairs-Rails: None Home Layout: Multi-level;Able to live on main level with bedroom/bathroom     Bathroom Shower/Tub: Producer, Television/film/video: Handicapped height Bathroom Accessibility: Yes   Home Equipment: Cane - single point;Cane - quad;Shower seat - built in;Rollator (4 wheels);Hand held shower head;Adaptive equipment Adaptive Equipment: Long-handled sponge;Reacher Additional Comments: Pt's family owns a DME company and have access to all needed DME      Prior Functioning/Environment Prior Level of Function : Independent/Modified Independent;Needs assist             Mobility Comments: Pt is ambulatory without DME typically, was utilizing a rollator recently after getting sick while on vacation ADLs Comments: Independent to Mod I with ADLs, medication management, and light meal prep. Pt has aide for cleaning and laundry. Pt's spouse and friends also assist with transportation and home management tasks PRN.    OT Problem List: Decreased strength;Decreased activity tolerance;Impaired balance (sitting and/or standing)   OT Treatment/Interventions: Self-care/ADL training;Therapeutic exercise;Energy conservation;DME and/or AE instruction;Therapeutic activities;Patient/family education;Balance training      OT Goals(Current goals can be found in the care plan section)   Acute Rehab OT Goals Patient Stated Goal: to return home and to increase strength OT Goal Formulation: With patient Time For Goal Achievement: 01/09/24 Potential to Achieve Goals: Good ADL Goals Pt Will Perform Grooming: with modified independence;standing Pt Will Perform Lower Body Bathing: with modified independence;sitting/lateral leans;sit to/from stand Pt Will Perform Lower Body Dressing: with modified independence;sitting/lateral leans;sit to/from stand Pt Will  Transfer to Toilet: Independently;ambulating;grab bars (comfort height  toilet) Pt/caregiver will Perform Home Exercise Program: Increased strength;Both right and left upper extremity;With theraband;Independently;With written HEP provided Additional ADL Goal #1: Patient will demonstrate ability to Independently state 3 energy conservation strategies for increased safety and independence with functional tasks and improved quality of life.   OT Frequency:  Min 1X/week    Co-evaluation              AM-PAC OT 6 Clicks Daily Activity     Outcome Measure Help from another person eating meals?: None Help from another person taking care of personal grooming?: A Little Help from another person toileting, which includes using toliet, bedpan, or urinal?: A Little Help from another person bathing (including washing, rinsing, drying)?: A Little Help from another person to put on and taking off regular upper body clothing?: None Help from another person to put on and taking off regular lower body clothing?: A Little 6 Click Score: 20   End of Session Nurse Communication: Mobility status  Activity Tolerance: Patient limited by fatigue Patient left: in bed;with call bell/phone within reach;with bed alarm set  OT Visit Diagnosis: Muscle weakness (generalized) (M62.81);Other (comment) (decreased activity tolerance)                Time: 8967-8951 OT Time Calculation (min): 16 min Charges:  OT General Charges $OT Visit: 1 Visit OT Evaluation $OT Eval Low Complexity: 1 Low  Margarie Rockey HERO., OTR/L, MA Acute Rehab [phone]   Margarie FORBES Horns 12/26/2023, 2:19 PM

## 2023-12-26 NOTE — TOC Progression Note (Signed)
 Transition of Care [facility]) - Progression Note    Patient Details  Name: Linda Woods MRN: [id number] Date of Birth: Oct 04, 1950  Transition of Care [facility]) CM/SW Contact  Rosaline JONELLE Joe, RN Phone Number: 12/26/2023, 12:22 PM  Clinical Narrative:    CM met with the patient at the bedside to discuss IP Care management needs.  The patient lives at home with spouse and also has a private caregiver every day.  DME at the home includes Cane, quad cane, shower seat, and rolator.  Medicare choice regarding home health was provided for home health services.  Patient did not have a preference.  Patient was active with [facility] in the past per EPIC.  [facility] was called and accepted for services for PT.  HH PT was accepted.  When stable  - patient will return home with family by car.                     Expected Discharge Plan and Services         Expected Discharge Date: 12/27/23                                     Social Drivers of Health (SDOH) Interventions SDOH Screenings   Food Insecurity: No Food Insecurity (12/22/2023)  Housing: Low Risk  (12/22/2023)  Transportation Needs: No Transportation Needs (12/22/2023)  Utilities: Not At Risk (12/22/2023)  Depression (PHQ2-9): Low Risk  (12/17/2023)  Social Connections: Moderately Integrated (12/22/2023)  Tobacco Use: Low Risk  (12/22/2023)    Readmission Risk Interventions    12/26/2023   12:22 PM  Readmission Risk Prevention Plan  Transportation Screening Complete  PCP or Specialist Appt within 3-5 Days Complete  HRI or Home Care Consult Complete  Social Work Consult for Recovery Care Planning/Counseling Complete  Palliative Care Screening Complete  Medication Review Oceanographer) Referral to Pharmacy

## 2023-12-27 ENCOUNTER — Other Ambulatory Visit: Payer: Self-pay

## 2023-12-27 ENCOUNTER — Encounter: Payer: Self-pay | Admitting: Internal Medicine

## 2023-12-27 ENCOUNTER — Telehealth: Payer: Self-pay

## 2023-12-27 ENCOUNTER — Other Ambulatory Visit (HOSPITAL_COMMUNITY): Payer: Self-pay

## 2023-12-27 DIAGNOSIS — E43 Unspecified severe protein-calorie malnutrition: Secondary | ICD-10-CM

## 2023-12-27 DIAGNOSIS — A0472 Enterocolitis due to Clostridium difficile, not specified as recurrent: Secondary | ICD-10-CM | POA: Diagnosis not present

## 2023-12-27 DIAGNOSIS — C3492 Malignant neoplasm of unspecified part of left bronchus or lung: Secondary | ICD-10-CM | POA: Diagnosis not present

## 2023-12-27 DIAGNOSIS — D61810 Antineoplastic chemotherapy induced pancytopenia: Secondary | ICD-10-CM | POA: Diagnosis not present

## 2023-12-27 DIAGNOSIS — E039 Hypothyroidism, unspecified: Secondary | ICD-10-CM | POA: Diagnosis not present

## 2023-12-27 LAB — BPAM PLATELET PHERESIS
Blood Product Expiration Date: 202511072359
Blood Product Expiration Date: 202511072359
ISSUE DATE / TIME: 202511061327
ISSUE DATE / TIME: 202511061713
Unit Type and Rh: 5100
Unit Type and Rh: 6200

## 2023-12-27 LAB — MAGNESIUM: Magnesium: 1.5 mg/dL — ABNORMAL LOW (ref 1.7–2.4)

## 2023-12-27 LAB — PREPARE PLATELET PHERESIS
Unit division: 0
Unit division: 0

## 2023-12-27 LAB — CULTURE, BLOOD (ROUTINE X 2)
Culture: NO GROWTH
Culture: NO GROWTH
Special Requests: ADEQUATE

## 2023-12-27 LAB — CBC WITH DIFFERENTIAL/PLATELET
Abs Immature Granulocytes: 0.12 K/uL — ABNORMAL HIGH (ref 0.00–0.07)
Basophils Absolute: 0 K/uL (ref 0.0–0.1)
Basophils Relative: 0 %
Eosinophils Absolute: 0 K/uL (ref 0.0–0.5)
Eosinophils Relative: 2 %
HCT: 21.8 % — ABNORMAL LOW (ref 36.0–46.0)
Hemoglobin: 7.4 g/dL — ABNORMAL LOW (ref 12.0–15.0)
Immature Granulocytes: 7 %
Lymphocytes Relative: 9 %
Lymphs Abs: 0.2 K/uL — ABNORMAL LOW (ref 0.7–4.0)
MCH: 31.9 pg (ref 26.0–34.0)
MCHC: 33.9 g/dL (ref 30.0–36.0)
MCV: 94 fL (ref 80.0–100.0)
Monocytes Absolute: 0.2 K/uL (ref 0.1–1.0)
Monocytes Relative: 11 %
Neutro Abs: 1.3 K/uL — ABNORMAL LOW (ref 1.7–7.7)
Neutrophils Relative %: 71 %
Platelets: 50 K/uL — ABNORMAL LOW (ref 150–400)
RBC: 2.32 MIL/uL — ABNORMAL LOW (ref 3.87–5.11)
RDW: 14.2 % (ref 11.5–15.5)
Smear Review: NORMAL
WBC: 1.9 K/uL — ABNORMAL LOW (ref 4.0–10.5)
nRBC: 0 % (ref 0.0–0.2)

## 2023-12-27 LAB — RENAL FUNCTION PANEL
Albumin: 2.2 g/dL — ABNORMAL LOW (ref 3.5–5.0)
Anion gap: 9 (ref 5–15)
BUN: 8 mg/dL (ref 8–23)
CO2: 22 mmol/L (ref 22–32)
Calcium: 7.7 mg/dL — ABNORMAL LOW (ref 8.9–10.3)
Chloride: 107 mmol/L (ref 98–111)
Creatinine, Ser: 0.54 mg/dL (ref 0.44–1.00)
GFR, Estimated: >60 mL/min (ref >60)
Glucose, Bld: 89 mg/dL (ref 70–99)
Phosphorus: 2.9 mg/dL (ref 2.5–4.6)
Potassium: 3.3 mmol/L — ABNORMAL LOW (ref 3.5–5.1)
Sodium: 138 mmol/L (ref 135–145)

## 2023-12-27 MED ORDER — FERROUS SULFATE 325 (65 FE) MG PO TABS
325.0000 mg | ORAL_TABLET | Freq: Every day | ORAL | 1 refills | Status: AC
  Filled 2023-12-27: qty 30, 30d supply, fill #0

## 2023-12-27 MED ORDER — ENSURE PLUS HIGH PROTEIN PO LIQD
237.0000 mL | Freq: Two times a day (BID) | ORAL | 1 refills | Status: AC
  Filled 2023-12-27: qty 14220, 30d supply, fill #0

## 2023-12-27 MED ORDER — MAGNESIUM OXIDE 400 MG PO TABS
400.0000 mg | ORAL_TABLET | Freq: Every day | ORAL | 0 refills | Status: AC
  Filled 2023-12-27: qty 30, 30d supply, fill #0

## 2023-12-27 MED ORDER — POTASSIUM CHLORIDE CRYS ER 20 MEQ PO TBCR
20.0000 meq | EXTENDED_RELEASE_TABLET | Freq: Every day | ORAL | 0 refills | Status: AC
  Filled 2023-12-27: qty 10, 10d supply, fill #0

## 2023-12-27 MED ORDER — ADULT MULTIVITAMIN W/MINERALS CH
1.0000 | ORAL_TABLET | Freq: Every day | ORAL | 1 refills | Status: AC
  Filled 2023-12-27: qty 90, 90d supply, fill #0

## 2023-12-27 MED ORDER — MAGNESIUM SULFATE 4 GM/100ML IV SOLN
4.0000 g | Freq: Once | INTRAVENOUS | Status: AC
  Administered 2023-12-27: 4 g via INTRAVENOUS
  Filled 2023-12-27: qty 100

## 2023-12-27 MED ORDER — VITAMIN B-12 1000 MCG PO TABS
1000.0000 ug | ORAL_TABLET | Freq: Every day | ORAL | 1 refills | Status: AC
  Filled 2023-12-27: qty 30, 30d supply, fill #0

## 2023-12-27 MED ORDER — FIDAXOMICIN 200 MG PO TABS
200.0000 mg | ORAL_TABLET | Freq: Two times a day (BID) | ORAL | 0 refills | Status: AC
  Filled 2023-12-27: qty 12, 6d supply, fill #0

## 2023-12-27 NOTE — Telephone Encounter (Signed)
 Received telephone call from the patient requesting to schedule a follow-up appointment w/ labs for Monday, 12/30/23. Transferred call to scheduler.

## 2023-12-27 NOTE — Discharge Summary (Signed)
 Physician Discharge Summary   Patient: Linda Woods MRN: [id number] DOB: 1950-09-21  Admit date:     12/22/2023  Discharge date: 12/27/2023  Discharge Physician: Concepcion Riser   PCP: Sheryle Carwin, MD   Recommendations at discharge:    PCP follow up in 1 week with repeat CBC. Oncology follow up as scheduled.  Discharge Diagnoses: Principal Problem:   Acute colitis Active Problems:   Essential hypertension   Acquired hypothyroidism   Gastroesophageal reflux disease without esophagitis   Hypertriglyceridemia   Benign essential hypertension   S/P left TKA   Adenocarcinoma of left lung, stage 3 (HCC)   Mixed hyperlipidemia   Hyponatremia   Pancytopenia (HCC)   Protein-calorie malnutrition, severe  Resolved Problems:   * No resolved hospital problems. *  Hospital Course: Linda Woods is a 73 yr old female with PMH of non-small cell lung cancer stage III on chemoradiation, last chemotherapy 5 days ago and radiation about 1 week ago, hypothyroidism, GERD, HTN, HLD present to the hospital with complaints of nausea and diarrhea ongoing for last 3 days. Admitted to [facility] service for sepsis due to C. difficile colitis. Started on fidoxamyxin therapy. Her diarrhea did improve. Patient does have pancytopenia with ANC 0.2.  Patient did receive 1 unit PRBC, also 2 units of platelets. She is started on granisetron per oncology recommendation and her counts improved, hemodynamically stable to be discharged home.   Assessment and Plan: Sepsis due to C. difficile colitis- Presented with complaints of diarrhea, GI pathogen panel negative.  C. difficile positive for toxigenic PCR. Initially started on vancomycin , later changed to fidaxomicin therapy. Patient's diarrhea much better today. Advised to finish fidaxomicin for 6 more days to complete 10 day therapy. She is advised to follow PCP in 1 week.   Pancytopenia due to chemotherapy. Absolute neutropenia Anemia and  thrombocytopenia. Patient did receive 1 unit of PRBC for hemoglobin 6.8. Patient received 4 doses of Granix per oncology recommendations. Platelet count today 1709/08/13, 2 units platelet transfused H&H stable. WBC 1.9, ANC 1.3 today. Continue b12, iron, folate supplementation. Stable for discharge with PCP and oncology follow up, repeat CBC.   Hypokalemia-daily replacement ordered.  Hypomagnesemia- supplementation ordered.   GERD-continue Pepcid .   Hypothyroidism-continue Synthroid .   Hypertension- metoprolol  on hold due to low blood pressures.   Stage III non-small cell lung cancer-patient is currently on chemoradiation. Tagrisso  on hold while in hospital. She will follow Dr. Sherrod for further chemotherapy.   Chronic hyponatremia- Na  stable. Takes salt tablets at home.   Protein calorie malnutrition Hypoalbuminemia- Encouraged oral diet, supplementation.      Consultants: none Procedures performed: none  Disposition: Home Diet recommendation:  Discharge Diet Orders (From admission, onward)     Start     Ordered   12/27/23 0000  Diet general        12/27/23 1137           Cardiac diet DISCHARGE MEDICATION: Allergies as of 12/27/2023   No Known Allergies      Medication List     STOP taking these medications    ciprofloxacin  500 MG tablet Commonly known as: CIPRO    dexamethasone  4 MG tablet Commonly known as: DECADRON    lidocaine  2 % solution Commonly known as: XYLOCAINE    lidocaine -prilocaine  cream Commonly known as: EMLA    metoprolol  succinate 50 MG 24 hr tablet Commonly known as: TOPROL -XL   ondansetron  4 MG tablet Commonly known as: ZOFRAN        TAKE these medications  albuterol  108 (90 Base) MCG/ACT inhaler Commonly known as: VENTOLIN  HFA Inhale 2 puffs into the lungs every 6 (six) hours as needed for wheezing or shortness of breath.   alum & mag hydroxide-simeth 200-200-20 MG/5ML suspension Commonly known as:  MAALOX/MYLANTA Take 15 mLs by mouth every 6 (six) hours as needed for indigestion or heartburn.   chlorhexidine  4 % external liquid Commonly known as: HIBICLENS  Apply 15 mLs (1 Application total) topically as directed for 30 doses. Use as directed daily for 5 days every other week for 6 weeks.   cyanocobalamin  1000 MCG tablet Commonly known as: VITAMIN B12 Take 1 tablet (1,000 mcg total) by mouth daily.   diphenoxylate -atropine  2.5-0.025 MG tablet Commonly known as: LOMOTIL  Take 1 tablet by mouth 4 (four) times daily as needed for diarrhea or loose stools. What changed:  how much to take when to take this   famotidine  40 MG tablet Commonly known as: PEPCID  Take 40 mg by mouth at bedtime.   feeding supplement Liqd Take 237 mLs by mouth 2 (two) times daily between meals.   ferrous sulfate  325 (65 FE) MG tablet Take 1 tablet (325 mg total) by mouth daily with breakfast.   fexofenadine 180 MG tablet Commonly known as: ALLEGRA Take 180 mg by mouth daily as needed for allergies.   fidaxomicin 200 MG Tabs tablet Commonly known as: DIFICID Take 1 tablet (200 mg total) by mouth 2 (two) times daily for 6 days.   fluticasone  50 MCG/ACT nasal spray Commonly known as: FLONASE  Place 1 spray into both nostrils as needed for allergies.   folic acid  1 MG tablet Commonly known as: FOLVITE  Take 1 tablet (1 mg total) by mouth daily. Start 7 days before pemetrexed chemotherapy. Continue until 21 days after pemetrexed completed.   HYDROcodone -acetaminophen  5-325 MG tablet Commonly known as: NORCO/VICODIN Take 1 tablet by mouth every 6 (six) hours as needed for moderate pain (pain score 4-6). What changed:  when to take this reasons to take this   levothyroxine  100 MCG tablet Commonly known as: Synthroid  Take 1 tablet (100 mcg total) by mouth daily. One po qd What changed: additional instructions   lidocaine  5 % Commonly known as: LIDODERM  Place 1 patch onto the skin daily as  needed (back pain).   LORazepam  0.5 MG tablet Commonly known as: ATIVAN  Take 0.5 mg by mouth in the morning and at bedtime.   magnesium  oxide 400 MG tablet Commonly known as: MAG-OX Take 1 tablet (400 mg total) by mouth daily.   methenamine 1 g tablet Commonly known as: HIPREX Take 1 tablet (1 g total) by mouth 2 (two) times daily with a meal.   multivitamin with minerals Tabs tablet Take 1 tablet by mouth daily. Start taking on: December 28, 2023   Myrbetriq  50 MG Tb24 tablet Generic drug: mirabegron  ER Take 50 mg by mouth daily.   ondansetron  4 MG disintegrating tablet Commonly known as: ZOFRAN -ODT Take 4-8 mg by mouth every 6 (six) hours as needed.   oxyCODONE  5 MG immediate release tablet Commonly known as: Oxy IR/ROXICODONE  Take 5 mg by mouth every 6 (six) hours as needed for severe pain (pain score 7-10).   potassium chloride  SA 20 MEQ tablet Commonly known as: KLOR-CON  M Take 1 tablet (20 mEq total) by mouth daily for 10 days.   Premarin vaginal cream Generic drug: conjugated estrogens Place 1 applicator vaginally as needed (urinary issues).   prochlorperazine  10 MG tablet Commonly known as: COMPAZINE  Take 1 tablet (10 mg  total) by mouth every 6 (six) hours as needed for nausea or vomiting.   RABEprazole  20 MG tablet Commonly known as: Aciphex  Take 1 tablet (20 mg total) by mouth 2 (two) times a week.   rosuvastatin  5 MG tablet Commonly known as: CRESTOR  Take 1 tablet (5 mg total) by mouth daily.   sodium chloride  1 g tablet Take 1 tablet (1 g total) by mouth 2 (two) times daily with a meal.   Stiolto Respimat  2.5-2.5 MCG/ACT Aers Generic drug: Tiotropium Bromide -Olodaterol INHALE TWO PUFFS INTO THE LUNGS DAILY   Tagrisso  80 MG tablet Generic drug: osimertinib  mesylate Take 1 tablet (80 mg total) by mouth daily.   triamcinolone  cream 0.1 % Commonly known as: KENALOG  Apply 1 Application topically 2 (two) times daily. As needed for itching/rash    trimethoprim 100 MG tablet Commonly known as: TRIMPEX Take 100 mg by mouth daily.        Follow-up Information     [facility] Follow up.   Specialty: Home Health Services Why: [facility] will provide home health services.  they will call you in the next 24-48 hours to set up services. Contact information: [address] [place] [address] [phone]                Discharge Exam: Filed Weights   12/22/23 1519  Weight: 65.3 kg      12/27/2023   12:30 PM 12/27/2023    8:09 AM 12/27/2023    5:40 AM  Vitals with BMI  Systolic 85 101 103  Diastolic 64 70 63  Pulse 100 107 95   General - Elderly Caucasian female, no apparent distress HEENT - PERRLA, EOMI, atraumatic head, non tender sinuses. Lung - Clear, no rales, rhonchi, wheezes. Heart - S1, S2 heard, no murmurs, rubs, trace pedal edema. Abdomen - Soft, non tender, bowel sounds good Neuro - Alert, awake and oriented x 3, non focal exam. Skin - Warm and dry.  Condition at discharge: stable  The results of significant diagnostics from this hospitalization (including imaging, microbiology, ancillary and laboratory) are listed below for reference.   Imaging Studies: CT CHEST ABDOMEN PELVIS W CONTRAST Result Date: 12/22/2023 CLINICAL DATA:  Sepsis. EXAM: CT CHEST, ABDOMEN, AND PELVIS WITH CONTRAST TECHNIQUE: Multidetector CT imaging of the chest, abdomen and pelvis was performed following the standard protocol during bolus administration of intravenous contrast. RADIATION DOSE REDUCTION: This exam was performed according to the departmental dose-optimization program which includes automated exposure control, adjustment of the mA and/or kV according to patient size and/or use of iterative reconstruction technique. CONTRAST:  75mL OMNIPAQUE  IOHEXOL  350 MG/ML SOLN COMPARISON:  10/18/2023, 09/30/2023. FINDINGS: CT CHEST FINDINGS Cardiovascular: The heart is normal in size and there is a  trace pericardial effusion. There is atherosclerotic calcification of the aorta without evidence of aneurysm. The pulmonary trunk is normal in caliber. Mediastinum/Nodes: No mediastinal, hilar, or axillary lymphadenopathy. The thyroid  gland, trachea, and esophagus are within normal limits. There is a moderate hiatal hernia. A cystic structure is noted in the posterior mediastinum measuring 2.9 cm, unchanged. Lungs/Pleura: Apical pleural scarring is noted on the left. There is perihilar consolidation in the left upper lobe with scarring, bronchiectasis, and volume loss, measuring 4.7 x 3.2 cm versus 3.7 x 2.5 cm on the prior exam, axial image 63. Increasing consolidation is noted in the perihilar region on the right with associated architectural distortion. There is a stable nodular density in the right upper lobe measuring  5 mm, axial image 55. No effusion or pneumothorax is seen. Musculoskeletal: Degenerative changes are present in the thoracic spine. Stable sclerotic lesions are noted in the bones. No acute fracture is seen. CT ABDOMEN PELVIS FINDINGS Hepatobiliary: No focal abnormality in the liver. Fatty infiltration of the liver is present. The gallbladder is not seen. There is stable dilatation the biliary tree, query post cholecystectomy status. Pancreas: Unremarkable. No pancreatic ductal dilatation or surrounding inflammatory changes. Spleen: Normal in size without focal abnormality. Adrenals/Urinary Tract: A there is a right adrenal nodule measuring 1.3 x 1.0 cm, slightly increased in size. No adrenal nodule on the left. The kidneys enhance symmetrically. Renal cysts are present on the right. No renal calculus bilaterally. There is mild hydronephrosis on the left without evidence of obstructing stone common unchanged from previous exam. The bladder is unremarkable. Stomach/Bowel: There is a moderate hiatal hernia. The stomach is otherwise within normal limits. No bowel obstruction, free air, or pneumatosis  is seen. Scattered diverticular present along the colon without evidence of diverticulitis. Fluid attenuation is noted throughout the colon. Appendix is not seen. Vascular/Lymphatic: Aortic atherosclerosis. Mildly enlarged lymph node is noted in the periaortic space on the left in the upper abdomen measuring 9 mm. Reproductive: Status post hysterectomy. No adnexal masses. Other: No abdominopelvic ascites. Musculoskeletal: Degenerative changes and stable sclerotic lesions are noted in the bones. 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 There is a compression deformity with kyphoplasty changes at L1. IMPRESSION: 1. Diffuse fluid levels in the colon suggesting diarrheal illness. 2. Increasing perihilar airspace consolidation bilaterally, likely related to prior radiation changes, however superimposed is infection or malignancy cannot be excluded. 3. Stable sclerotic lesions in the bones, compatible with known metastasis. 4. Aortic atherosclerosis. 5. Remaining stable findings as described above. Electronically Signed   By: Leita Birmingham M.D.   On: 12/22/2023 18:09   DG Chest Port 1 View Result Date: 12/22/2023 CLINICAL DATA:  Sepsis EXAM: PORTABLE CHEST 1 VIEW COMPARISON:  Chest radiograph dated 05/26/2023 FINDINGS: Normal lung volumes. Unchanged left apical and right perihilar scarring. No focal consolidations. No pleural effusion or pneumothorax. The heart size and mediastinal contours are within normal limits. No acute osseous abnormality. IMPRESSION: Unchanged scarring in the bilateral lungs.  No focal consolidations. Electronically Signed   By: Limin  Xu M.D.   On: 12/22/2023 17:52   CT BIOPSY Result Date: 12/09/2023 INDICATION: Metastatic lung cancer, PET positive left retroperitoneal periaortic adenopathy EXAM: CT CORE BIOPSY LEFT RETROPERITONEAL ADENOPATHY MEDICATIONS: 1% LIDOCAINE  LOCAL ANESTHESIA/SEDATION: Moderate (conscious) sedation was employed during this procedure. A total of  Versed  1.5 mg and Fentanyl  50 mcg was administered intravenously by the radiology nurse. Also, 4 mg Zofran  administered IV Total intra-service moderate Sedation Time: 10 minutes. The patient's level of consciousness and vital signs were monitored continuously by radiology nursing throughout the procedure under my direct supervision. COMPLICATIONS: None immediate. PROCEDURE: Informed written consent was obtained from the patient after a thorough discussion of the procedural risks, benefits and alternatives. All questions were addressed. Maximal Sterile Barrier Technique was utilized including caps, mask, sterile gowns, sterile gloves, sterile drape, hand hygiene and skin antiseptic. A timeout was performed prior to the initiation of the procedure. Previous imaging reviewed. Patient positioned prone. Noncontrast localization CT performed. The left retroperitoneal adenopathy was localized and marked. This was correlated with the PET-CT. Under sterile conditions and local anesthesia, the 17 gauge 11.8 cm guide needle was advanced to the left retroperitoneal adenopathy. Needle position confirmed with CT.  18 gauge core biopsies obtained through the access. Samples placed in formalin. Needle removed. Postprocedure imaging demonstrates no hemorrhage or hematoma. Patient tolerated biopsy well. IMPRESSION: Successful CT-guided 18 gauge core biopsy of the left retroperitoneal adenopathy Electronically Signed   By: CHRISTELLA.  Shick M.D.   On: 12/09/2023 11:28    Microbiology: Results for orders placed or performed during the hospital encounter of 12/22/23  Blood Culture (routine x 2)     Status: None   Collection Time: 12/22/23  3:22 PM   Specimen: BLOOD  Result Value Ref Range Status   Specimen Description BLOOD RIGHT ANTECUBITAL  Final   Special Requests   Final    BOTTLES DRAWN AEROBIC AND ANAEROBIC Blood Culture adequate volume   Culture   Final    NO GROWTH 5 DAYS Performed at [facility], [address] [place], [place] [address]    Report Status 12/27/2023 FINAL  Final  C Difficile Quick Screen w PCR reflex     Status: Abnormal   Collection Time: 12/22/23  3:23 PM   Specimen: STOOL  Result Value Ref Range Status   C Diff antigen POSITIVE (A) NEGATIVE Final   C Diff toxin NEGATIVE NEGATIVE Final   C Diff interpretation Results are indeterminate. See PCR results.  Final    Comment: Performed at [facility], [address], [place], [place] [address]  C. Diff by PCR, Reflexed     Status: Abnormal   Collection Time: 12/22/23  3:23 PM  Result Value Ref Range Status   Toxigenic C. Difficile by PCR POSITIVE (A) NEGATIVE Final    Comment: Positive for toxigenic C. difficile with little to no toxin production. Only treat if clinical presentation suggests symptomatic illness.   Hypervirulent Strain PRESUMPTIVE NEGATIVE PRESUMPTIVE NEGATIVE Final    Comment: Performed at [facility], [address] [place], [place] [address]  Blood Culture (routine x 2)     Status: None   Collection Time: 12/22/23  4:03 PM   Specimen: BLOOD  Result Value Ref Range Status   Specimen Description BLOOD SITE NOT SPECIFIED  Final   Special Requests   Final    BOTTLES DRAWN AEROBIC AND ANAEROBIC Blood Culture results may not be optimal due to an inadequate volume of blood received in culture bottles   Culture   Final    NO GROWTH 5 DAYS Performed at [facility], [address], [place], [place] [address]    Report Status 12/27/2023 FINAL  Final  Gastrointestinal Panel by PCR , Stool     Status: None   Collection Time: 12/22/23  6:21 PM   Specimen: Stool  Result Value Ref Range Status   Campylobacter species NOT DETECTED NOT DETECTED Final   Plesimonas shigelloides NOT DETECTED NOT DETECTED Final   Salmonella species NOT DETECTED NOT DETECTED Final   Yersinia enterocolitica NOT DETECTED NOT DETECTED Final   Vibrio species NOT DETECTED NOT DETECTED Final   Vibrio cholerae NOT DETECTED NOT  DETECTED Final   Enteroaggregative E coli (EAEC) NOT DETECTED NOT DETECTED Final   Enteropathogenic E coli (EPEC) NOT DETECTED NOT DETECTED Final   Enterotoxigenic E coli (ETEC) NOT DETECTED NOT DETECTED Final   Shiga like toxin producing E coli (STEC) NOT DETECTED NOT DETECTED Final   Shigella/Enteroinvasive E coli (EIEC) NOT DETECTED NOT DETECTED Final   Cryptosporidium NOT DETECTED NOT DETECTED Final   Cyclospora cayetanensis NOT DETECTED NOT DETECTED Final   Entamoeba histolytica NOT  DETECTED NOT DETECTED Final   Giardia lamblia NOT DETECTED NOT DETECTED Final   Adenovirus F40/41 NOT DETECTED NOT DETECTED Final   Astrovirus NOT DETECTED NOT DETECTED Final   Norovirus GI/GII NOT DETECTED NOT DETECTED Final   Rotavirus A NOT DETECTED NOT DETECTED Final   Sapovirus (I, II, IV, and V) NOT DETECTED NOT DETECTED Final    Comment: Performed at [facility], [address] [place], [place] [address]    Labs: CBC: Recent Labs  Lab 12/23/23 1905 12/24/23 0432 12/25/23 0402 12/26/23 0117 12/27/23 0434  WBC 0.6* 0.6* 0.5* 1.3* 1.9*  NEUTROABS 0.4* 0.3* 0.2* 1.0* 1.3*  HGB 8.8* 8.6* 9.0* 8.3* 7.4*  HCT 26.3* 25.1* 25.9* 24.2* 21.8*  MCV 96.3 95.1 92.2 92.4 94.0  PLT 43* 35* 25* 17* 50*   Basic Metabolic Panel: Recent Labs  Lab 12/22/23 1533 12/22/23 1548 12/23/23 0420 12/24/23 0432 12/25/23 0402 12/26/23 0117 12/27/23 0434  NA 134*   < > 135 138 135 139 138  K 3.6   < > 3.0* 4.1 3.4* 3.3* 3.3*  CL 101   < > 105 112* 104 105 107  CO2 21*  --  20* 20* 21* 23 22  GLUCOSE 102*   < > 117* 94 87 130* 89  BUN 19   < > 11 7* 7* 10 8  CREATININE 0.83   < > 0.71 0.64 0.59 0.66 0.54  CALCIUM  8.1*  --  7.3* 8.0* 7.9* 7.6* 7.7*  MG 1.7  --   --  1.4* 2.0 1.6* 1.5*  PHOS  --   --   --  2.2* 3.9 2.2* 2.9   < > = values in this interval not displayed.   Liver Function Tests: Recent Labs  Lab 12/22/23 1533 12/23/23 0420 12/24/23 0432 12/25/23 0402 12/26/23 0117  12/27/23 0434  AST 16 14*  --   --   --   --   ALT 13 10  --   --   --   --   ALKPHOS 68 47  --   --   --   --   BILITOT 0.7 0.4  --   --   --   --   PROT 5.2* 3.7*  --   --   --   --   ALBUMIN 2.7* 1.8* 1.9* 2.1* 2.1* 2.2*   CBG: No results for input(s): GLUCAP in the last 168 hours.  Discharge time spent: 36 minutes.  Signed: Concepcion Riser, MD [facility] 12/27/2023

## 2023-12-30 ENCOUNTER — Other Ambulatory Visit: Payer: Self-pay

## 2023-12-30 ENCOUNTER — Encounter: Payer: Self-pay | Admitting: Internal Medicine

## 2023-12-31 ENCOUNTER — Telehealth: Payer: Self-pay

## 2023-12-31 ENCOUNTER — Other Ambulatory Visit: Payer: Self-pay | Admitting: *Deleted

## 2023-12-31 ENCOUNTER — Encounter: Payer: Self-pay | Admitting: Internal Medicine

## 2023-12-31 DIAGNOSIS — C3492 Malignant neoplasm of unspecified part of left bronchus or lung: Secondary | ICD-10-CM

## 2023-12-31 MED ORDER — LIDOCAINE-PRILOCAINE 2.5-2.5 % EX CREA: 1.0000 | TOPICAL_CREAM | CUTANEOUS | 2 refills | Status: AC | PRN

## 2023-12-31 NOTE — Telephone Encounter (Signed)
 Spoke with patient regarding hospital follow-up appointment. Informed patient that, per Dr. Sherrod, it is appropriate to keep the scheduled appointment on 01/09/24 and there is no need to be seen sooner at this time. Advised patient to call the office with any concerns or questions prior to the appointment.  Patient also inquired about port placement for treatment. Informed patient that port placement is recommended; however, the decision is ultimately hers. Patient expressed desire to proceed with port placement. Order placed. Patient verbalized understanding and agreement with the plan.

## 2024-01-01 ENCOUNTER — Other Ambulatory Visit: Payer: Self-pay

## 2024-01-01 ENCOUNTER — Ambulatory Visit: Admitting: Internal Medicine

## 2024-01-01 ENCOUNTER — Other Ambulatory Visit

## 2024-01-01 ENCOUNTER — Ambulatory Visit

## 2024-01-01 NOTE — Progress Notes (Signed)
 Specialty Pharmacy Refill Coordination Note  Linda Woods is a 73 y.o. female contacted today regarding refills of specialty medication(s) Osimertinib  Mesylate (TAGRISSO )   Patient requested Delivery   Delivery date: 01/09/24   Verified address: [address], [place], [address]   Medication will be filled on: 01/08/24

## 2024-01-02 ENCOUNTER — Other Ambulatory Visit: Payer: Self-pay

## 2024-01-02 ENCOUNTER — Inpatient Hospital Stay: Attending: Internal Medicine

## 2024-01-02 DIAGNOSIS — C7951 Secondary malignant neoplasm of bone: Secondary | ICD-10-CM | POA: Insufficient documentation

## 2024-01-02 DIAGNOSIS — Z9221 Personal history of antineoplastic chemotherapy: Secondary | ICD-10-CM | POA: Insufficient documentation

## 2024-01-02 DIAGNOSIS — J90 Pleural effusion, not elsewhere classified: Secondary | ICD-10-CM | POA: Insufficient documentation

## 2024-01-02 DIAGNOSIS — R000 Tachycardia, unspecified: Secondary | ICD-10-CM | POA: Insufficient documentation

## 2024-01-02 DIAGNOSIS — K449 Diaphragmatic hernia without obstruction or gangrene: Secondary | ICD-10-CM | POA: Insufficient documentation

## 2024-01-02 DIAGNOSIS — M47814 Spondylosis without myelopathy or radiculopathy, thoracic region: Secondary | ICD-10-CM | POA: Insufficient documentation

## 2024-01-02 DIAGNOSIS — C3492 Malignant neoplasm of unspecified part of left bronchus or lung: Secondary | ICD-10-CM

## 2024-01-02 DIAGNOSIS — I3139 Other pericardial effusion (noninflammatory): Secondary | ICD-10-CM | POA: Insufficient documentation

## 2024-01-02 DIAGNOSIS — D61818 Other pancytopenia: Secondary | ICD-10-CM | POA: Insufficient documentation

## 2024-01-02 DIAGNOSIS — I700 Atherosclerosis of aorta: Secondary | ICD-10-CM | POA: Insufficient documentation

## 2024-01-02 DIAGNOSIS — Z79890 Hormone replacement therapy (postmenopausal): Secondary | ICD-10-CM | POA: Insufficient documentation

## 2024-01-02 DIAGNOSIS — D649 Anemia, unspecified: Secondary | ICD-10-CM | POA: Insufficient documentation

## 2024-01-02 DIAGNOSIS — N281 Cyst of kidney, acquired: Secondary | ICD-10-CM | POA: Insufficient documentation

## 2024-01-02 DIAGNOSIS — Z79899 Other long term (current) drug therapy: Secondary | ICD-10-CM | POA: Insufficient documentation

## 2024-01-02 DIAGNOSIS — Z5111 Encounter for antineoplastic chemotherapy: Secondary | ICD-10-CM | POA: Insufficient documentation

## 2024-01-02 DIAGNOSIS — Z923 Personal history of irradiation: Secondary | ICD-10-CM | POA: Insufficient documentation

## 2024-01-02 DIAGNOSIS — C3412 Malignant neoplasm of upper lobe, left bronchus or lung: Secondary | ICD-10-CM | POA: Insufficient documentation

## 2024-01-02 DIAGNOSIS — A414 Sepsis due to anaerobes: Secondary | ICD-10-CM | POA: Insufficient documentation

## 2024-01-02 LAB — CBC WITH DIFFERENTIAL (CANCER CENTER ONLY)
Abs Immature Granulocytes: 0.43 K/uL — ABNORMAL HIGH (ref 0.00–0.07)
Basophils Absolute: 0 K/uL (ref 0.0–0.1)
Basophils Relative: 1 %
Eosinophils Absolute: 0.1 K/uL (ref 0.0–0.5)
Eosinophils Relative: 3 %
HCT: 22.8 % — ABNORMAL LOW (ref 36.0–46.0)
Hemoglobin: 7.6 g/dL — ABNORMAL LOW (ref 12.0–15.0)
Immature Granulocytes: 13 %
Lymphocytes Relative: 11 %
Lymphs Abs: 0.4 K/uL — ABNORMAL LOW (ref 0.7–4.0)
MCH: 32.5 pg (ref 26.0–34.0)
MCHC: 33.3 g/dL (ref 30.0–36.0)
MCV: 97.4 fL (ref 80.0–100.0)
Monocytes Absolute: 0.5 K/uL (ref 0.1–1.0)
Monocytes Relative: 16 %
Neutro Abs: 1.9 K/uL (ref 1.7–7.7)
Neutrophils Relative %: 56 %
Platelet Count: 68 K/uL — ABNORMAL LOW (ref 150–400)
RBC: 2.34 MIL/uL — ABNORMAL LOW (ref 3.87–5.11)
RDW: 15.6 % — ABNORMAL HIGH (ref 11.5–15.5)
Smear Review: NORMAL
WBC Count: 3.4 K/uL — ABNORMAL LOW (ref 4.0–10.5)
nRBC: 0 % (ref 0.0–0.2)

## 2024-01-02 LAB — CMP (CANCER CENTER ONLY)
ALT: 25 U/L (ref 0–44)
AST: 21 U/L (ref 15–41)
Albumin: 3.6 g/dL (ref 3.5–5.0)
Alkaline Phosphatase: 69 U/L (ref 38–126)
Anion gap: 6 (ref 5–15)
BUN: 34 mg/dL — ABNORMAL HIGH (ref 8–23)
CO2: 25 mmol/L (ref 22–32)
Calcium: 8.8 mg/dL — ABNORMAL LOW (ref 8.9–10.3)
Chloride: 108 mmol/L (ref 98–111)
Creatinine: 0.75 mg/dL (ref 0.44–1.00)
GFR, Estimated: >60 mL/min (ref >60)
Glucose, Bld: 111 mg/dL — ABNORMAL HIGH (ref 70–99)
Potassium: 4.5 mmol/L (ref 3.5–5.1)
Sodium: 139 mmol/L (ref 135–145)
Total Bilirubin: 0.2 mg/dL (ref 0.0–1.2)
Total Protein: 5.6 g/dL — ABNORMAL LOW (ref 6.5–8.1)

## 2024-01-02 LAB — SAMPLE TO BLOOD BANK

## 2024-01-03 ENCOUNTER — Telehealth: Payer: Self-pay

## 2024-01-03 ENCOUNTER — Other Ambulatory Visit: Payer: Self-pay

## 2024-01-03 DIAGNOSIS — D649 Anemia, unspecified: Secondary | ICD-10-CM

## 2024-01-03 LAB — PREPARE RBC (CROSSMATCH)

## 2024-01-03 NOTE — Progress Notes (Signed)
 Spoke with blood bank and confirmed orders

## 2024-01-03 NOTE — Telephone Encounter (Signed)
 Spoke with patients care giver, Melissa in regards to lab results.  Per Cassie, PA hgb is low and patient would benefit from 1 unit of blood.  Informed Melissa of blood product appt tomorrow at 800 AM. Reminded her to make sure patient has blue arm on. Blood orders placed.

## 2024-01-03 NOTE — Telephone Encounter (Signed)
 Spoke with Morna from [facility] regarding the patient's heart rate. Morna reported HR readings of 120 bpm at rest, 137 bpm after movement, and 115 bpm upon returning to rest. Informed Morna that the patient is anemic and scheduled to receive blood products tomorrow, and that the elevated heart rate may be related to the anemia. Advised that if the patient develops any lightheadedness or shortness of breath between now and the weekend, she should go to the ED. Morna stated that the patient is not experiencing any symptoms at this time and that she will inform the patient to seek emergency care if symptoms occur. She voiced thanks.

## 2024-01-04 ENCOUNTER — Inpatient Hospital Stay

## 2024-01-04 DIAGNOSIS — D649 Anemia, unspecified: Secondary | ICD-10-CM

## 2024-01-04 MED ORDER — DIPHENHYDRAMINE HCL 25 MG PO CAPS
25.0000 mg | ORAL_CAPSULE | Freq: Once | ORAL | Status: AC
  Administered 2024-01-04: 25 mg via ORAL
  Filled 2024-01-04: qty 1

## 2024-01-04 MED ORDER — SODIUM CHLORIDE 0.9% IV SOLUTION
250.0000 mL | INTRAVENOUS | Status: DC
  Administered 2024-01-04: 250 mL via INTRAVENOUS

## 2024-01-04 MED ORDER — ACETAMINOPHEN 325 MG PO TABS
650.0000 mg | ORAL_TABLET | Freq: Once | ORAL | Status: AC
  Administered 2024-01-04: 650 mg via ORAL
  Filled 2024-01-04: qty 2

## 2024-01-04 NOTE — Patient Instructions (Signed)
 Blood Transfusion, Adult A blood transfusion is a procedure in which you receive blood or a type of blood cell (blood component) through an IV. You may need a blood transfusion when you have a low blood count, which is a low number of any blood cell. This may result from a bleeding disorder, illness, injury, or surgery. The blood may come from a donor, or you may be able to have your own blood collected and stored (autologous blood donation) before a planned surgery. The blood given in a transfusion may be made up of different blood components. You may receive: Red blood cells. These carry oxygen to the cells in the body. Platelets. These help your blood to clot. Plasma. This is the liquid part of your blood. It carries proteins and other substances throughout the body. White blood cells. These help you fight infections. If you have hemophilia or another clotting disorder, you may also receive other types of blood products. Depending on the type of blood product, this procedure may take 1-4 hours to complete. Tell a health care provider about: Any bleeding problems you have. Any previous reactions you have had during a blood transfusion. Any allergies you have. All medicines you are taking, including vitamins, herbs, eye drops, creams, and over-the-counter medicines. Any surgeries you have had. Any medical conditions you have. Whether you are pregnant or may be pregnant. What are the risks? Talk with your health care provider about risks. The most common problems include: A mild allergic reaction, such as red, swollen areas of skin (hives) and itching. Fever or chills. This may be the body's response to new blood cells received. This may occur during or up to 4 hours after the transfusion. More serious problems may include: A serious allergic reaction that causes difficulty breathing or swelling around the face and lips. Transfusion-associated circulatory overload (TACO), or too much fluid in  the lungs. This may cause breathing problems. Transfusion-related acute lung injury (TRALI), which causes breathing difficulty and low oxygen in the blood. This can occur within hours of the transfusion or several days later. Iron overload. This can happen after receiving many blood transfusions over a period of time. Infection or virus being transmitted. This is rare because donated blood is carefully tested before it is given. Hemolytic transfusion reaction. This is rare. It happens when the body's defense system (immune system)tries to attack the new blood cells. Symptoms may include fever, chills, nausea, low blood pressure, and low back or chest pain. Transfusion-associated graft-versus-host disease (TAGVHD). This is rare. It happens when donated cells attack the body's healthy tissues. What happens before the procedure? You will have a blood test to check your blood type. This test is done to know what kind of blood your body will accept and to match it to the donor blood. If you are going to have a planned surgery, you may be able to do an autologous blood donation. This may be done in case you need to have a transfusion. You will have your temperature, blood pressure, and pulse checked before the transfusion. If you have had an allergic reaction to a transfusion in the past, you may be given medicine to help prevent a reaction. This medicine may be given to you by mouth (orally) or through an IV. What happens during the procedure?  An IV will be inserted into one of your veins. The bag of blood will be attached to your IV. The blood will then enter through your vein. Your temperature, blood pressure,  and pulse will be monitored during the transfusion. This monitoring is done to detect early signs of a transfusion reaction. Tell your nurse right away if you have any of these symptoms during the transfusion: Shortness of breath or trouble breathing. Chest or back pain. Fever or  chills. Itching or hives. If you have any signs or symptoms of a reaction, your transfusion will be stopped and you may be given medicine. When the transfusion is complete, your IV will be removed. Pressure may be applied to the IV site for a few minutes. A bandage (dressing)will be applied. The procedure may vary among health care providers and hospitals. What happens after the procedure? Your temperature, blood pressure, pulse, breathing rate, and blood oxygen level will be monitored until you leave the hospital or clinic. Your blood may be tested to see how you have responded to the transfusion. You may be warmed with fluids or blankets to maintain a normal body temperature. If you receive your blood transfusion in an outpatient setting, you will be told whom to contact to report any reactions. Where to find more information Visit the [employer]: redcross.org Summary A blood transfusion is a procedure in which you receive blood or a type of blood cell (blood component) through an IV. The blood given in a transfusion may be made up of different blood components. You may receive red blood cells, platelets, plasma, or white blood cells depending on the condition treated. Your temperature, blood pressure, and pulse will be monitored before, during, and after the transfusion. After the transfusion, your blood may be tested to see how your body has responded. This information is not intended to replace advice given to you by your health care provider. Make sure you discuss any questions you have with your health care provider. Document Revised: 05/05/2021 Document Reviewed: 05/05/2021 [employer]vier Patient Education  2024 [employer].

## 2024-01-06 ENCOUNTER — Telehealth: Payer: Self-pay

## 2024-01-06 LAB — BPAM RBC
Blood Product Expiration Date: 202511262359
ISSUE DATE / TIME: 202511150927
Unit Type and Rh: 600

## 2024-01-06 LAB — TYPE AND SCREEN
ABO/RH(D): A POS
Antibody Screen: NEGATIVE
Unit division: 0

## 2024-01-06 NOTE — Telephone Encounter (Signed)
 Discussed with patient she can talk with Linda Woods at her next visit on 11/20 in regards to new potassium prescriprion if needed.   Patient voiced understanding.

## 2024-01-07 NOTE — Progress Notes (Unsigned)
 [facility] OFFICE PROGRESS NOTE  Sheryle Carwin, MD [address] [place] [address]  DIAGNOSIS: Stage IIIc (T3, N3, M0) non-small cell lung cancer, adenocarcinoma presented with large left upper lobe lung mass in addition to left infrahilar and right hilar and subcarinal lymphadenopathy diagnosed in August 2021.   Molecular Biomarkers: Insufficient tissue for foundation 1. Guardant 360 results.   DETECTED ALTERATION(S) / BIOMARKER(S)% CFDNA OR AMPLIFICATIONASSOCIATED FDA-APPROVED THERAPIESCLINICAL TRIAL AVAILABILITY   EGFR L858R, 1.2%, Afatinib, Dacomitinib, Erlotinib, Gefitinib, Osimertinib , Ramucirumab Yes [id number], 1.0%, Afatinib, Dacomitinib, Erlotinib, Gefitinib, Osimertinib , Ramucirumab Yes [id number] 1.0% None     No  PRIOR THERAPY: 1) Weekly concurrent chemoradiation with carboplatin  for an AUC of 2 and paclitaxel  45 mg/m2.  First dose expected on 11/16/2019. Status post 7 cycles.  2) Tagrisso  80 mg p.o. daily. First dose started 01/31/2020.  Status post 42 months of treatment.  She has been off treatment for 4 months based on her request and secondary to adverse effects but this was discontinued secondary to disease progression.  CURRENT THERAPY: Systemic chemotherapy with carboplatin  for AUC of 5, Alimta 500 mg/M2 and Tagrisso  80 mg p.o. daily. First dose 11/21/2023. Status post 2 cycles. Her dose of carboplatin  was reduced to an AUC of 4 and Alimta 400 mg/m2 starting from cycle #2 due to cytopenias. starting from cycle #3, carboplatin  was removed and she was on single agent Alimta 400 mg/m2 in addition to Tagrisso .  INTERVAL HISTORY: Manette Doto 73 y.o. female returns to the clinic today for a follow-up visit accompanied by a support member.  The patient was last seen in clinic on 12/17/2023. Her doses of IV chemotherapy were reduced at that time due to cytopenias.   She had been having progressive anemia and denied abnormal bleeding. She was  given stool cards. She is not on anti-coagulation.   She presented to the ER on 11/2 for diarrhea. She also had leukopenia with WBC of 0.4. Imaging showed diffuse fluid levels in colon suggesting diarrheal illness, increased perihilar airspace consolidation likely due to radiation however could potentially be a superimposed infection or malignancy, stable sclerotic lesion of bone. She was found to have c. Diff. She also received 1 unit of blood and 2 units of platelets. She was treated with antibiotics.   She received 4 doses of granix.   She also had a blood transfusion in the interval since being discharged from the hospital on 01/04/24.   Since discharge, there has been significant improvement in her condition, with no recurrence of fever or diarrhea. Her appetite has returned, and she has regained weight. She experiences occasional abdominal pain, which she attributes to constipation possibly related to iron supplementation. She maintains hydration and consumes fruits, vegetables, and berry juice to manage her bowel movements.  She reports no issues with breathing, experiencing shortness of breath only with significant exertion. No regular cough, hemoptysis, or chest pain. No rashes, headaches, or vision changes. She had a repeat CT scan while admitted to the hospital.  She denies headache or vision changes. She is scheduled for cycle #3 today.   MEDICAL HISTORY: Past Medical History:  Diagnosis Date   Anemia    as a child   Anxiety    Asthma 10/19/2020   Bursitis    Chronic reflux esophagitis    Complication of anesthesia    Diarrhea, functional    Diverticulosis    GERD (gastroesophageal reflux disease)    History of kidney stones    Hypertension  Knee pain    right knee-seeing ortho   lung ca 09/2019   Osteoarthritis    Plantar fasciitis    Pneumonia    PONV (postoperative nausea and vomiting)    has used the patch before and that helps   Pure hypercholesterolemia     RLS (restless legs syndrome)    Superficial vein thrombosis    Thyroid  disease    hypothyroid    ALLERGIES:  has no known allergies.  MEDICATIONS:  Current Outpatient Medications  Medication Sig Dispense Refill   albuterol  (VENTOLIN  HFA) 108 (90 Base) MCG/ACT inhaler Inhale 2 puffs into the lungs every 6 (six) hours as needed for wheezing or shortness of breath. 8.5 g 0   cyanocobalamin  (VITAMIN B12) 1000 MCG tablet Take 1 tablet (1,000 mcg total) by mouth daily. 30 tablet 1   diphenoxylate -atropine  (LOMOTIL ) 2.5-0.025 MG tablet Take 1 tablet by mouth 4 (four) times daily as needed for diarrhea or loose stools. 30 tablet 0   famotidine  (PEPCID ) 40 MG tablet Take 40 mg by mouth at bedtime.     feeding supplement (ENSURE PLUS HIGH PROTEIN) LIQD Take 237 mLs by mouth 2 (two) times daily between meals. 14220 mL 1   ferrous sulfate  325 (65 FE) MG tablet Take 1 tablet (325 mg total) by mouth daily with breakfast. 30 tablet 1   fexofenadine (ALLEGRA) 180 MG tablet Take 180 mg by mouth daily as needed for allergies.     fluticasone  (FLONASE ) 50 MCG/ACT nasal spray Place 1 spray into both nostrils as needed for allergies.     folic acid  (FOLVITE ) 1 MG tablet Take 1 tablet (1 mg total) by mouth daily. Start 7 days before pemetrexed chemotherapy. Continue until 21 days after pemetrexed completed. 100 tablet 3   HYDROcodone -acetaminophen  (NORCO/VICODIN) 5-325 MG tablet Take 1 tablet by mouth every 6 (six) hours as needed for moderate pain (pain score 4-6). 30 tablet 0   levothyroxine  (SYNTHROID ) 100 MCG tablet Take 1 tablet (100 mcg total) by mouth daily. One po qd 90 tablet 0   lidocaine  (LIDODERM ) 5 % Place 1 patch onto the skin daily as needed (back pain).     LORazepam  (ATIVAN ) 0.5 MG tablet Take 0.5 mg by mouth in the morning and at bedtime.     magnesium  oxide (MAG-OX) 400 MG tablet Take 1 tablet (400 mg total) by mouth daily. 30 tablet 0   Multiple Vitamin (MULTIVITAMIN WITH MINERALS) TABS tablet  Take 1 tablet by mouth daily. 90 tablet 1   MYRBETRIQ  50 MG TB24 tablet Take 50 mg by mouth daily.     ondansetron  (ZOFRAN -ODT) 4 MG disintegrating tablet Take 4-8 mg by mouth every 6 (six) hours as needed.     osimertinib  mesylate (TAGRISSO ) 80 MG tablet Take 1 tablet (80 mg total) by mouth daily. 30 tablet 3   potassium chloride  SA (KLOR-CON  M) 20 MEQ tablet Take 1 tablet (20 mEq total) by mouth daily for 10 days. 10 tablet 0   PREMARIN vaginal cream Place 1 applicator vaginally as needed (urinary issues).     prochlorperazine  (COMPAZINE ) 10 MG tablet Take 1 tablet (10 mg total) by mouth every 6 (six) hours as needed for nausea or vomiting. 30 tablet 1   RABEprazole  (ACIPHEX ) 20 MG tablet Take 1 tablet (20 mg total) by mouth 2 (two) times a week.     rosuvastatin  (CRESTOR ) 5 MG tablet Take 1 tablet (5 mg total) by mouth daily. 90 tablet 3   STIOLTO RESPIMAT  2.5-2.5 MCG/ACT  AERS INHALE TWO PUFFS INTO THE LUNGS DAILY 4 g 5   triamcinolone  cream (KENALOG ) 0.1 % Apply 1 Application topically 2 (two) times daily. As needed for itching/rash 453.6 g 0   alum & mag hydroxide-simeth (MAALOX/MYLANTA) 200-200-20 MG/5ML suspension Take 15 mLs by mouth every 6 (six) hours as needed for indigestion or heartburn. (Patient not taking: Reported on 01/09/2024) 355 mL 0   chlorhexidine  (HIBICLENS ) 4 % external liquid Apply 15 mLs (1 Application total) topically as directed for 30 doses. Use as directed daily for 5 days every other week for 6 weeks. (Patient not taking: Reported on 01/09/2024) 946 mL 1   lidocaine -prilocaine  (EMLA ) cream Apply 1 Application topically as needed. 30 g 2   methenamine (HIPREX) 1 g tablet Take 1 tablet (1 g total) by mouth 2 (two) times daily with a meal. (Patient not taking: Reported on 01/09/2024) 60 tablet 11   oxyCODONE  (OXY IR/ROXICODONE ) 5 MG immediate release tablet Take 5 mg by mouth every 6 (six) hours as needed for severe pain (pain score 7-10). (Patient not taking: Reported on  12/25/2023)     sodium chloride  1 g tablet Take 1 tablet (1 g total) by mouth 2 (two) times daily with a meal. (Patient not taking: Reported on 01/09/2024) 60 tablet 0   trimethoprim (TRIMPEX) 100 MG tablet Take 100 mg by mouth daily. (Patient not taking: Reported on 01/09/2024)     No current facility-administered medications for this visit.   Facility-Administered Medications Ordered in Other Visits  Medication Dose Route Frequency Provider Last Rate Last Admin   0.9 %  sodium chloride  infusion   Intravenous Continuous Sherrod Sherrod, MD 10 mL/hr at 01/09/24 1109 New Bag at 01/09/24 1109   cyanocobalamin  (VITAMIN B12) injection 1,000 mcg  1,000 mcg Intramuscular Once Sherrod Sherrod, MD       PEMEtrexed Disodium (ALIMTA) 600 mg in sodium chloride  0.9 % 100 mL chemo infusion  400 mg/m2 (Treatment Plan Recorded) Intravenous Once Sherrod Sherrod, MD        SURGICAL HISTORY:  Past Surgical History:  Procedure Laterality Date   APPENDECTOMY     BRONCHIAL BIOPSY  10/20/2019   Procedure: BRONCHIAL BIOPSIES;  Surgeon: Shelah Lamar RAMAN, MD;  Location: [facility] ENDOSCOPY;  Service: Pulmonary;;   BRONCHIAL BRUSHINGS  10/20/2019   Procedure: BRONCHIAL BRUSHINGS;  Surgeon: Shelah Lamar RAMAN, MD;  Location[facility] ENDOSCOPY;  Service: Pulmonary;;   BRONCHIAL NEEDLE ASPIRATION BIOPSY  10/20/2019   Procedure: BRONCHIAL NEEDLE ASPIRATION BIOPSIES;  Surgeon: Shelah Lamar RAMAN, MD;  Location: [facility] ENDOSCOPY;  Service: Pulmonary;;   BRONCHIAL WASHINGS  10/20/2019   Procedure: BRONCHIAL WASHINGS;  Surgeon: Shelah Lamar RAMAN, MD;  Location: [facility] ENDOSCOPY;  Service: Pulmonary;;   CARPAL TUNNEL RELEASE Right 2011   Dr. Camella   CHOLECYSTECTOMY N/A    COLPORRHAPHY     posterior   HAND SURGERY Right 2016   Nerve surgery, Dr. Camella   IR KYPHO LUMBAR INC FX REDUCE BONE BX UNI/BIL CANNULATION INC/IMAGING  07/03/2023   OVARIAN CYST REMOVAL     RECTOCELE REPAIR  2011   w/TVH and sling   TONSILLECTOMY     TONSILLECTOMY      TOTAL KNEE ARTHROPLASTY Left 09/09/2018   Procedure: TOTAL KNEE ARTHROPLASTY, CORTISONE INJECTION RIGHT KNEE;  Surgeon: Ernie Cough, MD;  Location: [facility] ORS;  Service: Orthopedics;  Laterality: Left;  70 mins   TOTAL KNEE ARTHROPLASTY Right 04/16/2023   Procedure: TOTAL KNEE ARTHROPLASTY;  Surgeon: Ernie Cough, MD;  Location: [facility] ORS;  Service: Orthopedics;  Laterality: Right;   TOTAL VAGINAL HYSTERECTOMY  10/18/2009   rectocele repair, sling   TUBAL LIGATION Bilateral    VARICOSE VEIN SURGERY     VIDEO BRONCHOSCOPY WITH ENDOBRONCHIAL NAVIGATION N/A 10/20/2019   Procedure: VIDEO BRONCHOSCOPY WITH ENDOBRONCHIAL NAVIGATION;  Surgeon: Shelah Lamar RAMAN, MD;  Location: [facility] ENDOSCOPY;  Service: Pulmonary;  Laterality: N/A;    REVIEW OF SYSTEMS:   Review of Systems  Constitutional: Improving appetite. Negative for appetite change, chills, fever and unexpected weight change.  HENT: Negative for mouth sores, nosebleeds, sore throat and trouble swallowing.   Eyes: Negative for eye problems and icterus.  Respiratory: Negative for cough, hemoptysis, shortness of breath and wheezing.   Cardiovascular: Negative for chest pain and leg swelling.  Gastrointestinal: Negative for abdominal pain, constipation, diarrhea, nausea and vomiting.  Genitourinary: Negative for bladder incontinence, difficulty urinating, dysuria, frequency and hematuria.   Musculoskeletal: Negative for back pain, gait problem, neck pain and neck stiffness.  Skin: Negative for itching and rash.  Neurological: Negative for dizziness, extremity weakness, gait problem, headaches, light-headedness and seizures.  Hematological: Negative for adenopathy. Does not bruise/bleed easily.  Psychiatric/Behavioral: Negative for confusion, depression and sleep disturbance. The patient is not nervous/anxious.     PHYSICAL EXAMINATION:  Blood pressure 98/62, pulse (!) 107, temperature 98.2 F (36.8 C), temperature source Temporal, resp. rate 14,  weight 122 lb (55.3 kg), last menstrual period 08/21/2002, SpO2 100%.  ECOG PERFORMANCE STATUS: 1  Physical Exam  Constitutional: Oriented to person, place, and time and thin appearing female and in no distress.  HENT:  Head: Normocephalic and atraumatic.  Mouth/Throat: Oropharynx is clear and moist. No oropharyngeal exudate.  Eyes: Conjunctivae are normal. Right eye exhibits no discharge. Left eye exhibits no discharge. No scleral icterus.  Neck: Normal range of motion. Neck supple.  Cardiovascular: Normal rate, regular rhythm, normal heart sounds and intact distal pulses.   Pulmonary/Chest: Effort normal and breath sounds normal. No respiratory distress. No wheezes. No rales.  Abdominal: Soft. Bowel sounds are normal. Exhibits no distension and no mass. There is no tenderness.  Musculoskeletal: Normal range of motion. Exhibits no edema.  Lymphadenopathy:    No cervical adenopathy.  Neurological: Alert and oriented to person, place, and time. Exhibits normal muscle tone. Gait normal. Coordination normal.  Skin: Skin is warm and dry. No rash noted. Not diaphoretic. No erythema. No pallor.  Psychiatric: Mood, memory and judgment normal.  Vitals reviewed.  LABORATORY DATA: Lab Results  Component Value Date   WBC 5.2 01/09/2024   HGB 9.2 (L) 01/09/2024   HCT 27.2 (L) 01/09/2024   MCV 98.9 01/09/2024   PLT 171 01/09/2024      Chemistry      Component Value Date/Time   NA 139 01/09/2024 0955   K 5.0 01/09/2024 0955   CL 106 01/09/2024 0955   CO2 25 01/09/2024 0955   BUN 36 (H) 01/09/2024 0955   CREATININE 0.76 01/09/2024 0955      Component Value Date/Time   CALCIUM  9.4 01/09/2024 0955   ALKPHOS 76 01/09/2024 0955   AST 36 01/09/2024 0955   ALT 60 (H) 01/09/2024 0955   BILITOT <0.2 01/09/2024 0955       RADIOGRAPHIC STUDIES:  CT CHEST ABDOMEN PELVIS W CONTRAST Result Date: 12/22/2023 CLINICAL DATA:  Sepsis. EXAM: CT CHEST, ABDOMEN, AND PELVIS WITH CONTRAST TECHNIQUE:  Multidetector CT imaging of the chest, abdomen and pelvis was performed following the standard protocol during bolus administration of intravenous contrast. RADIATION  DOSE REDUCTION: This exam was performed according to the departmental dose-optimization program which includes automated exposure control, adjustment of the mA and/or kV according to patient size and/or use of iterative reconstruction technique. CONTRAST:  75mL OMNIPAQUE  IOHEXOL  350 MG/ML SOLN COMPARISON:  10/18/2023, 09/30/2023. FINDINGS: CT CHEST FINDINGS Cardiovascular: The heart is normal in size and there is a trace pericardial effusion. There is atherosclerotic calcification of the aorta without evidence of aneurysm. The pulmonary trunk is normal in caliber. Mediastinum/Nodes: No mediastinal, hilar, or axillary lymphadenopathy. The thyroid  gland, trachea, and esophagus are within normal limits. There is a moderate hiatal hernia. A cystic structure is noted in the posterior mediastinum measuring 2.9 cm, unchanged. Lungs/Pleura: Apical pleural scarring is noted on the left. There is perihilar consolidation in the left upper lobe with scarring, bronchiectasis, and volume loss, measuring 4.7 x 3.2 cm versus 3.7 x 2.5 cm on the prior exam, axial image 63. Increasing consolidation is noted in the perihilar region on the right with associated architectural distortion. There is a stable nodular density in the right upper lobe measuring 5 mm, axial image 55. No effusion or pneumothorax is seen. Musculoskeletal: Degenerative changes are present in the thoracic spine. Stable sclerotic lesions are noted in the bones. No acute fracture is seen. CT ABDOMEN PELVIS FINDINGS Hepatobiliary: No focal abnormality in the liver. Fatty infiltration of the liver is present. The gallbladder is not seen. There is stable dilatation the biliary tree, query post cholecystectomy status. Pancreas: Unremarkable. No pancreatic ductal dilatation or surrounding inflammatory  changes. Spleen: Normal in size without focal abnormality. Adrenals/Urinary Tract: A there is a right adrenal nodule measuring 1.3 x 1.0 cm, slightly increased in size. No adrenal nodule on the left. The kidneys enhance symmetrically. Renal cysts are present on the right. No renal calculus bilaterally. There is mild hydronephrosis on the left without evidence of obstructing stone common unchanged from previous exam. The bladder is unremarkable. Stomach/Bowel: There is a moderate hiatal hernia. The stomach is otherwise within normal limits. No bowel obstruction, free air, or pneumatosis is seen. Scattered diverticular present along the colon without evidence of diverticulitis. Fluid attenuation is noted throughout the colon. Appendix is not seen. Vascular/Lymphatic: Aortic atherosclerosis. Mildly enlarged lymph node is noted in the periaortic space on the left in the upper abdomen measuring 9 mm. Reproductive: Status post hysterectomy. No adnexal masses. Other: No abdominopelvic ascites. Musculoskeletal: Degenerative changes and stable sclerotic lesions are noted in the bones. 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 There is a compression deformity with kyphoplasty changes at L1. IMPRESSION: 1. Diffuse fluid levels in the colon suggesting diarrheal illness. 2. Increasing perihilar airspace consolidation bilaterally, likely related to prior radiation changes, however superimposed is infection or malignancy cannot be excluded. 3. Stable sclerotic lesions in the bones, compatible with known metastasis. 4. Aortic atherosclerosis. 5. Remaining stable findings as described above. Electronically Signed   By: Leita Birmingham M.D.   On: 12/22/2023 18:09   DG Chest Port 1 View Result Date: 12/22/2023 CLINICAL DATA:  Sepsis EXAM: PORTABLE CHEST 1 VIEW COMPARISON:  Chest radiograph dated 05/26/2023 FINDINGS: Normal lung volumes. Unchanged left apical and right perihilar scarring. No focal consolidations. No  pleural effusion or pneumothorax. The heart size and mediastinal contours are within normal limits. No acute osseous abnormality. IMPRESSION: Unchanged scarring in the bilateral lungs.  No focal consolidations. Electronically Signed   By: Limin  Xu M.D.   On: 12/22/2023 17:52     ASSESSMENT/PLAN:  This is a very  pleasant 73 years old white female recently diagnosed with a stage IIIc/IV (T3, N3, M0/M1a) non-small cell lung cancer, adenocarcinoma presented with large left upper lobe lung mass in addition to left infrahilar, right hilar and subcarinal lymphadenopathy and small left pleural effusion diagnosed in August 2021 with positive EGFR mutation in exon 21 (L858R).    The patient completed a course of concurrent chemoradiation with weekly carboplatin  and paclitaxel  status post 7 cycles.  She tolerated her treatment well except for fatigue as well as a skin burn and cough. Her scan showed mild improvement of the left upper lobe lung mass as well as the mediastinal lymphadenopathy.   The patient continued to have small left pleural effusion that is suspicious given her presentation.   She then was on Tagrisso  80 mg p.o. daily started on January 31, 2020. She is status post 42 months of treatment. Her treatment was on hold par patient request and fatigue.    She also underwent kyphoplasty of L2 vertebral lesion and the biopsy was negative for malignancy.    She then had a PET scan performed on  which showed clear evidence for disease progression especially with bone metastasis in addition to increasing activity in the left hilar area and suspicious left axillary lymphadenopathy.    She is currently undergoing systemic chemotherapy with carboplatin  for an AUC of 5 and Alimta 500 mg/m2 in addition to resuming treatment with Tagrisso  80 mg p.o. daily.  Her dose was reduced from cycle #2 to carboplatin  for an AUC of 4 and alimta 400 mg/m2 in which she still experienced significant cytopenias and was  hospitalized for C.diff.   She required platelet transfusion, blood transfusion, and GCSF injections after cycle #2 despite dose reduction.   The patient was seen with Dr. Sherrod today. Due to severe intolerance to chemotherapy. Dr. Sherrod recommends removing carboplatin  from her care plan.   The patient was seen with Dr. Sherrod today.  Dr. Sherrod personally and independently reviewed the scan and discussed results with the patient today.  The scan showed stable disease.  He also reviewed that the patient developed a new mutation on her repeat molecular studies.   She will just proceed with single agent alimta 400 mg/m2.   We will still monitor her labs closely on a weekly basis. I have standing orders for a sample to blood bank.   She is getting a port-a-cath next week. We will change her future labs to labs/flush.   We will arrange supportive care if needed. If her ANC ~0.6 or less would recommend GCSF injections. If her Hbg is <8 would consider her for blood.   She will continue her iron supplement.   -The patient was advised to call immediately if she has any concerning symptoms in the interval. The patient voices understanding of current disease status and treatment options and is in agreement with the current care plan. All questions were answered. The patient knows to call the clinic with any problems, questions or concerns. We can certainly see the patient much sooner if necessary    Orders Placed This Encounter  Procedures   CBC with Differential (Cancer Center Only)    Standing Status:   Standing    Number of Occurrences:   10    Expiration Date:   01/08/2025   CMP (Cancer Center only)    Standing Status:   Standing    Number of Occurrences:   10    Expiration Date:   01/08/2025   Sample  to Blood Bank    Standing Status:   Standing    Number of Occurrences:   10    Next Expected Occurrence:   01/10/2024    Expiration Date:   01/08/2025     Issa Kosmicki L  Constantinos Krempasky, PA-C 01/09/24  ADDENDUM: Hematology/Oncology Attending: I had a face-to-face encounter with the patient today.  I reviewed her records, lab, scan and recommended her care plan.  This is a very pleasant 73 years old white female with metastatic non-small cell lung cancer that was initially diagnosed as a stage IIIc in August 2021 status post her course of concurrent chemoradiation followed by consolidation treatment with Tagrisso  80 mg p.o. daily status post 42 months.  The patient had 4 months of break of the treatment with Tarceva secondary to adverse effects but she developed disease progression and she started treatment again with systemic chemotherapy with carboplatin , Alimta and Tagrisso  80 mg p.o. daily status post 2 cycles.  She has a rough time of this treatment with significant pancytopenia and also recent hospitalization with C. difficile infection. She had repeat CT scan of the chest, abdomen and pelvis during her hospitalization on December 22, 2023 and that showed no concerning findings for disease progression. I recommended for the patient to continue her current treatment with a combination of systemic chemotherapy but only with single agent Alimta this point we discontinue carboplatin  starting from cycle #3.  She will also continue her current treatment with Tagrisso .  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  These 2 mutation are still sensitive to treatment with Tagrisso . I discussed the result with the patient and her friend and recommended for her to continue her treatment as planned. I will see her back for follow-up visit in 3 weeks for evaluation before the next cycle of her treatment. She was advised to call immediately if she has any other concerning symptoms in the interval. Disclaimer: This note was dictated with voice  recognition software. Similar sounding words can inadvertently be transcribed and may be missed upon review. Sherrod MARLA Sherrod, MD

## 2024-01-08 ENCOUNTER — Encounter: Payer: Self-pay | Admitting: Internal Medicine

## 2024-01-08 ENCOUNTER — Other Ambulatory Visit: Payer: Self-pay

## 2024-01-08 MED FILL — Fosaprepitant Dimeglumine For IV Infusion 150 MG (Base Eq): INTRAVENOUS | Qty: 5 | Status: AC

## 2024-01-08 NOTE — Progress Notes (Signed)
 Initial neurology clinic note  Linda Woods MRN: [id number] DOB: December 20, 1950  Referring provider: Sherrod Sherrod, MD  Primary care provider: Sheryle Carwin, MD  Reason for consult:  Headache per referral; numbness and tingling in hands and feet  Subjective:  This is [name], a 73 y.o. right-handed female with a medical history of stage IIIc non-small cell lung cancer, pre-DM, hypothyroidism, HLD, asthma, anxiety, HTN, nephrolithiasis, OA, restless leg syndrome, plantar fasciitis who presents to neurology clinic with numbness and tingling in hands and feet. The patient is accompanied by friend, Linda Woods.  Patient has noticed numbness and tingling in her hands and feet for maybe about 6 months. This is why she wants to be seen. Per referring documents, this appointment was for headaches, but patient states she is not having headaches anymore. Friend also mentions some confusion lately, but this could be related to recent hospitalization for C diff.  The numbness and tingling is in the tops of her feet and finger tips. She denies weakness. She has some imbalance but no falls. She does not remember having numbness or tingling when she was on chemo in 2021.  Patient mentions losing a lot of weight over the last year (about 40 lbs). She has been hospitalized multiple times including in [place] (10/2023). Patient states she was very out of it while on a cruise in [place]. She was taken to the hospital for a week. She had 2 lumbar punctures due to concern for meningitis. Patient is unclear what the diagnosis was. She started losing weight then. She was seen by ID here and not found to have any infection. She was also hospitalized for C diff for 5 days about 2 weeks ago. She also had gallbladder surgery that hurt her appetite as well.  More recently, she is eating well and gain a couple of pounds back.  Of note, MRI brain in 09/2023 by external report was unremarkable.  Per Dr.  Jeannett oncology note from 10/10/23: PRIOR THERAPY: Weekly concurrent chemoradiation with carboplatin  for an AUC of 2 and paclitaxel  45 mg/m2.  First dose expected on 11/16/2019. Status post 7 cycles.   CURRENT THERAPY: Tagrisso  80 mg p.o. daily. First dose started 01/31/2020.  Status post 42 months of treatment.  She has been off treatment for the last 3 months. Now back on per patient.  She takes iron, B12, calcium , B1, folic acid .  Smoker: no EtOH use: none Restrictive diet: no Family history of neurologic disease: no  MEDICATIONS:  Outpatient Encounter Medications as of 01/22/2024  Medication Sig Note   albuterol  (VENTOLIN  HFA) 108 (90 Base) MCG/ACT inhaler Inhale 2 puffs into the lungs every 6 (six) hours as needed for wheezing or shortness of breath.    cyanocobalamin  (VITAMIN B12) 1000 MCG tablet Take 1 tablet (1,000 mcg total) by mouth daily.    diphenoxylate -atropine  (LOMOTIL ) 2.5-0.025 MG tablet Take 1 tablet by mouth 4 (four) times daily as needed for diarrhea or loose stools.    famotidine  (PEPCID ) 40 MG tablet Take 40 mg by mouth at bedtime.    feeding supplement (ENSURE PLUS HIGH PROTEIN) LIQD Take 237 mLs by mouth 2 (two) times daily between meals.    ferrous sulfate  325 (65 FE) MG tablet Take 1 tablet (325 mg total) by mouth daily with breakfast.    fexofenadine (ALLEGRA) 180 MG tablet Take 180 mg by mouth daily as needed for allergies.    fluticasone  (FLONASE ) 50 MCG/ACT nasal spray Place 1 spray into both nostrils  as needed for allergies.    folic acid  (FOLVITE ) 1 MG tablet Take 1 tablet (1 mg total) by mouth daily. Start 7 days before pemetrexed  chemotherapy. Continue until 21 days after pemetrexed  completed.    HYDROcodone -acetaminophen  (NORCO/VICODIN) 5-325 MG tablet Take 1 tablet by mouth every 6 (six) hours as needed for moderate pain (pain score 4-6).    levothyroxine  (SYNTHROID ) 100 MCG tablet Take 1 tablet (100 mcg total) by mouth daily. One po qd 12/25/2023: Husband  is adamant the pt is still taking this medication daily. Dispense report does not support this claim.    lidocaine  (LIDODERM ) 5 % Place 1 patch onto the skin daily as needed (back pain).    lidocaine -prilocaine  (EMLA ) cream Apply 1 Application topically as needed.    LORazepam  (ATIVAN ) 0.5 MG tablet Take 0.5 mg by mouth in the morning and at bedtime.    magnesium  oxide (MAG-OX) 400 MG tablet Take 1 tablet (400 mg total) by mouth daily.    Multiple Vitamin (MULTIVITAMIN WITH MINERALS) TABS tablet Take 1 tablet by mouth daily.    MYRBETRIQ  50 MG TB24 tablet Take 50 mg by mouth daily.    ondansetron  (ZOFRAN -ODT) 4 MG disintegrating tablet Take 4-8 mg by mouth every 6 (six) hours as needed. 01/13/2024: Not started   osimertinib  mesylate (TAGRISSO ) 80 MG tablet Take 1 tablet (80 mg total) by mouth daily.    potassium chloride  SA (KLOR-CON  M) 20 MEQ tablet Take 1 tablet (20 mEq total) by mouth daily for 10 days.    PREMARIN vaginal cream Place 1 applicator vaginally as needed (urinary issues).    prochlorperazine  (COMPAZINE ) 10 MG tablet Take 1 tablet (10 mg total) by mouth every 6 (six) hours as needed for nausea or vomiting.    STIOLTO RESPIMAT  2.5-2.5 MCG/ACT AERS INHALE TWO PUFFS INTO THE LUNGS DAILY    alum & mag hydroxide-simeth (MAALOX/MYLANTA) 200-200-20 MG/5ML suspension Take 15 mLs by mouth every 6 (six) hours as needed for indigestion or heartburn. (Patient not taking: Reported on 01/22/2024)    chlorhexidine  (HIBICLENS ) 4 % external liquid Apply 15 mLs (1 Application total) topically as directed for 30 doses. Use as directed daily for 5 days every other week for 6 weeks. (Patient not taking: Reported on 01/22/2024)    methenamine  (HIPREX ) 1 g tablet Take 1 tablet (1 g total) by mouth 2 (two) times daily with a meal. (Patient not taking: Reported on 01/22/2024)    oxyCODONE  (OXY IR/ROXICODONE ) 5 MG immediate release tablet Take 5 mg by mouth every 6 (six) hours as needed for severe pain (pain score  7-10). (Patient not taking: Reported on 01/22/2024)    RABEprazole  (ACIPHEX ) 20 MG tablet Take 1 tablet (20 mg total) by mouth 2 (two) times a week. 12/25/2023: Husband verified the Pt is taking this medication BID. Dispense report does not support this claim.    rosuvastatin  (CRESTOR ) 5 MG tablet Take 1 tablet (5 mg total) by mouth daily. (Patient not taking: Reported on 01/22/2024)    sodium chloride  1 g tablet Take 1 tablet (1 g total) by mouth 2 (two) times daily with a meal. (Patient not taking: Reported on 01/22/2024)    triamcinolone  cream (KENALOG ) 0.1 % Apply 1 Application topically 2 (two) times daily. As needed for itching/rash (Patient not taking: Reported on 01/22/2024)    trimethoprim (TRIMPEX) 100 MG tablet Take 100 mg by mouth daily. (Patient not taking: Reported on 01/22/2024)    No facility-administered encounter medications on file as of 01/22/2024.  PAST MEDICAL HISTORY: Past Medical History:  Diagnosis Date   Anemia    as a child   Anxiety    Asthma 10/19/2020   Bursitis    Chronic reflux esophagitis    Complication of anesthesia    Diarrhea, functional    Diverticulosis    GERD (gastroesophageal reflux disease)    History of kidney stones    History of radiation therapy    Lumbar Spine-12/02/23-12/16/23- Dr. Lynwood Nasuti   Hypertension    Knee pain    right knee-seeing ortho   lung ca 09/2019   Osteoarthritis    Plantar fasciitis    Pneumonia    PONV (postoperative nausea and vomiting)    has used the patch before and that helps   Pure hypercholesterolemia    RLS (restless legs syndrome)    Superficial vein thrombosis    Thyroid  disease    hypothyroid    PAST SURGICAL HISTORY: Past Surgical History:  Procedure Laterality Date   APPENDECTOMY     BRONCHIAL BIOPSY  10/20/2019   Procedure: BRONCHIAL BIOPSIES;  Surgeon: Shelah Lamar RAMAN, MD;  Location: [facility];  Service: Pulmonary;;   BRONCHIAL BRUSHINGS  10/20/2019   Procedure: BRONCHIAL BRUSHINGS;   Surgeon: Shelah Lamar RAMAN, MD;  Location: [facility];  Service: Pulmonary;;   BRONCHIAL NEEDLE ASPIRATION BIOPSY  10/20/2019   Procedure: BRONCHIAL NEEDLE ASPIRATION BIOPSIES;  Surgeon: Shelah Lamar RAMAN, MD;  Location: [facility];  Service: Pulmonary;;   BRONCHIAL WASHINGS  10/20/2019   Procedure: BRONCHIAL WASHINGS;  Surgeon: Shelah Lamar RAMAN, MD;  Location: [facility];  Service: Pulmonary;;   CARPAL TUNNEL RELEASE Right 2011   Dr. Camella   CHOLECYSTECTOMY N/A    COLPORRHAPHY     posterior   HAND SURGERY Right 2016   Nerve surgery, Dr. Camella   IR IMAGING GUIDED PORT INSERTION  01/13/2024   IR KYPHO LUMBAR INC FX REDUCE BONE BX UNI/BIL CANNULATION INC/IMAGING  07/03/2023   OVARIAN CYST REMOVAL     RECTOCELE REPAIR  2011   w/TVH and sling   TONSILLECTOMY     TONSILLECTOMY     TOTAL KNEE ARTHROPLASTY Left 09/09/2018   Procedure: TOTAL KNEE ARTHROPLASTY, CORTISONE INJECTION RIGHT KNEE;  Surgeon: Ernie Cough, MD;  Location: [facility];  Service: Orthopedics;  Laterality: Left;  70 mins   TOTAL KNEE ARTHROPLASTY Right 04/16/2023   Procedure: TOTAL KNEE ARTHROPLASTY;  Surgeon: Ernie Cough, MD;  Location: [facility];  Service: Orthopedics;  Laterality: Right;   TOTAL VAGINAL HYSTERECTOMY  10/18/2009   rectocele repair, sling   TUBAL LIGATION Bilateral    VARICOSE VEIN SURGERY     VIDEO BRONCHOSCOPY WITH ENDOBRONCHIAL NAVIGATION N/A 10/20/2019   Procedure: VIDEO BRONCHOSCOPY WITH ENDOBRONCHIAL NAVIGATION;  Surgeon: Shelah Lamar RAMAN, MD;  Location: [facility];  Service: Pulmonary;  Laterality: N/A;    ALLERGIES: No Known Allergies  FAMILY HISTORY: Family History  Problem Relation Age of Onset   Cervical cancer Mother    Heart failure Mother    Heart failure Father    Diabetes Father    Cancer Brother    Breast cancer Maternal Aunt    Breast cancer Paternal Aunt     SOCIAL HISTORY: Social History   Tobacco Use   Smoking status: Never   Smokeless tobacco: Never  Vaping Use    Vaping status: Never Used  Substance Use Topics   Alcohol use: Not Currently    Alcohol/week: 1.0 standard drink of alcohol    Types: 1 Glasses of  wine per week   Drug use: No   Social History   Social History Narrative   Married, retired armed forces operational officer   Caffeine- coffee, 1 cup, occass tea   Education- BS   Children- 3    Objective:  Vital Signs:  BP 119/73   Pulse (!) 106   Ht 5' 4 (1.626 m)   Wt 122 lb (55.3 kg)   LMP 08/21/2002   SpO2 100%   BMI 20.94 kg/m   General: General appearance: Awake and alert. No distress. Cooperative with exam.  Skin: No obvious rash or jaundice. HEENT: Atraumatic. Anicteric. Lungs: Non-labored breathing on room air  Heart: Regular, tachycardic Extremities: No edema.  Psych: Affect appropriate.  Neurological: Mental Status: Alert. Speech fluent. No pseudobulbar affect Cranial Nerves: CNII: No RAPD. Visual fields intact. CNIII, IV, VI: PERRL. No nystagmus. EOMI. CN V: Facial sensation intact bilaterally to fine touch. CN VII: Facial muscles symmetric and strong. No ptosis at rest. CN VIII: Hears finger rub well bilaterally. CN IX: No hypophonia. CN X: Palate elevates symmetrically. CN XI: Full strength shoulder shrug bilaterally. CN XII: Tongue protrusion full and midline. No atrophy or fasciculations. No significant dysarthria Motor: Tone is normal.  Individual muscle group testing (MRC grade out of 5):  Movement     Neck flexion 5    Neck extension 5     Right Left   Shoulder abduction 5 5   Elbow flexion 5 5   Elbow extension 5 5   Finger abduction - FDI 5 5   Finger abduction - ADM 5 5   Finger extension 5 5   Finger distal flexion - 2/3 5 5    Finger distal flexion - 4/5 5 5    Thumb flexion - FPL 5 5   Thumb abduction - APB 5 5    Hip flexion 5 5   Knee extension 5 5   Knee flexion 5 5   Dorsiflexion 5 5   Plantarflexion 5 5    Reflexes:  Right Left   Bicep 2+ 2+   Tricep 2+ 2+   BrRad 2+ 2+    Knee 2+ 2+   Ankle 2+ 2+    Pathological Reflexes: Babinski: flexor response bilaterally Hoffman: absent bilaterally Troemner: absent bilaterally Sensation: Pinprick: Diminished in bilateral feet to ankles Vibration: Absent in bilateral great toes, diminished in ankles, otherwise intact Proprioception: Intact in bilateral great toes Coordination: Intact finger-to- nose-finger bilaterally. Romberg negative. Gait: Able to rise from chair with arms crossed unassisted. Normal, narrow-based gait.   Labs and Imaging review: Internal labs: 01/03/24: CBC w/ diff: WBC 3.4, Hb 7.6, MCV 97.4, plts 68 CMP significant for glucose 111  TSH (05/30/23): low at 0.185 Free T4 elevated to 1.36 Free T3 low at 1.4  02/28/23: HbA1c: 5.9 Lipid panel: tChol 162, LDL 50, TG 280  Imaging/Procedures: External MRI brain w/wo contrast (10/01/23): my summary of report below: 1. No evidence of mass, stroke, or any acute abnormality in brain 2. Few small white matter hyperintensities of white matter likely due to mild chronic microvascular ischemia 3. Venous angioma within caudal aspect of left frontal lobe felt to be clinically insignificant  Assessment/Plan:  Linda Woods is a 73 y.o. female who presents for evaluation of numbness and tingling in her feet and hands. She has a relevant medical history of stage IIIc non-small cell lung cancer, pre-DM, hypothyroidism, HLD, asthma, anxiety, HTN, nephrolithiasis, OA, restless leg syndrome, plantar fasciitis. Her neurological examination is pertinent for diminished  sensation to pinprick and vibration in bilateral feet. Available diagnostic data is significant for unremarkable MRI brain in 09/2023. 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 She was on cisplatin and paclitaxel  in the past, both of which are associated with neuropathy, and tagrisso  more recently which may also be associated with  neuropathy, usually when combined with other agents though. Her only other known risk factor for neuropathy is mild pre-DM. I will check labs to look for other causes particularly because of her significant weight loss over the last year.  In terms of her odd hospitalization in 10/2023 in [place] for AMS, there appears to have been significant concern for meningitis or intracranial pathology. Per patient, she had LP but no MRI at that time. There has been some recent concerns from friend about cognitive changes, so it is important to recheck MRI brain to make sure there is no new process.  PLAN: -Blood work: B1, B6, B12, MMA, IFE -MRI brain w/wo contrast -Discussed Cymbalta, patient defers for now -Lidocaine  cream as needed -Alpha lipoic acid 600 mg once or twice daily -Continue PT  -Return to clinic in 6 months  The impression above as well as the plan as outlined below were extensively discussed with the patient (in the company of friend) who voiced understanding. All questions were answered to their satisfaction.  The patient was counseled on pertinent fall precautions per the printed material provided today, and as noted under the Patient Instructions section below.  When available, results of the above investigations and possible further recommendations will be communicated to the patient via telephone/MyChart. Patient to call office if not contacted after expected testing turnaround time.   Total time spent reviewing records, interview, history/exam, documentation, and coordination of care on day of encounter:  70 min   Thank you for allowing me to participate in patient's care.  If I can answer any additional questions, I would be pleased to do so.  Venetia Potters, MD   CC: Sheryle Carwin, MD [address] [place] [address]  CC: Referring provider: Sherrod Sherrod, MD [address] [place],  [place] [address]

## 2024-01-09 ENCOUNTER — Encounter (HOSPITAL_COMMUNITY): Payer: Self-pay

## 2024-01-09 ENCOUNTER — Inpatient Hospital Stay: Admitting: Dietician

## 2024-01-09 ENCOUNTER — Inpatient Hospital Stay

## 2024-01-09 ENCOUNTER — Inpatient Hospital Stay: Admitting: Physician Assistant

## 2024-01-09 VITALS — BP 98/62 | HR 107 | Temp 98.2°F | Resp 14 | Wt 122.0 lb

## 2024-01-09 DIAGNOSIS — D6481 Anemia due to antineoplastic chemotherapy: Secondary | ICD-10-CM

## 2024-01-09 DIAGNOSIS — Z5111 Encounter for antineoplastic chemotherapy: Secondary | ICD-10-CM | POA: Diagnosis not present

## 2024-01-09 DIAGNOSIS — C3490 Malignant neoplasm of unspecified part of unspecified bronchus or lung: Secondary | ICD-10-CM | POA: Diagnosis not present

## 2024-01-09 DIAGNOSIS — C3492 Malignant neoplasm of unspecified part of left bronchus or lung: Secondary | ICD-10-CM

## 2024-01-09 LAB — CBC WITH DIFFERENTIAL (CANCER CENTER ONLY)
Abs Immature Granulocytes: 0.05 K/uL (ref 0.00–0.07)
Basophils Absolute: 0 K/uL (ref 0.0–0.1)
Basophils Relative: 1 %
Eosinophils Absolute: 0.1 K/uL (ref 0.0–0.5)
Eosinophils Relative: 1 %
HCT: 27.2 % — ABNORMAL LOW (ref 36.0–46.0)
Hemoglobin: 9.2 g/dL — ABNORMAL LOW (ref 12.0–15.0)
Immature Granulocytes: 1 %
Lymphocytes Relative: 5 %
Lymphs Abs: 0.3 K/uL — ABNORMAL LOW (ref 0.7–4.0)
MCH: 33.5 pg (ref 26.0–34.0)
MCHC: 33.8 g/dL (ref 30.0–36.0)
MCV: 98.9 fL (ref 80.0–100.0)
Monocytes Absolute: 0.6 K/uL (ref 0.1–1.0)
Monocytes Relative: 12 %
Neutro Abs: 4.2 K/uL (ref 1.7–7.7)
Neutrophils Relative %: 80 %
Platelet Count: 171 K/uL (ref 150–400)
RBC: 2.75 MIL/uL — ABNORMAL LOW (ref 3.87–5.11)
RDW: 18 % — ABNORMAL HIGH (ref 11.5–15.5)
WBC Count: 5.2 K/uL (ref 4.0–10.5)
nRBC: 0 % (ref 0.0–0.2)

## 2024-01-09 LAB — IRON AND IRON BINDING CAPACITY (CC-WL,HP ONLY)
Iron: 66 ug/dL (ref 28–170)
Saturation Ratios: 23 % (ref 10.4–31.8)
TIBC: 283 ug/dL (ref 250–450)
UIBC: 217 ug/dL

## 2024-01-09 LAB — CMP (CANCER CENTER ONLY)
ALT: 60 U/L — ABNORMAL HIGH (ref 0–44)
AST: 36 U/L (ref 15–41)
Albumin: 4 g/dL (ref 3.5–5.0)
Alkaline Phosphatase: 76 U/L (ref 38–126)
Anion gap: 9 (ref 5–15)
BUN: 36 mg/dL — ABNORMAL HIGH (ref 8–23)
CO2: 25 mmol/L (ref 22–32)
Calcium: 9.4 mg/dL (ref 8.9–10.3)
Chloride: 106 mmol/L (ref 98–111)
Creatinine: 0.76 mg/dL (ref 0.44–1.00)
GFR, Estimated: >60 mL/min (ref >60)
Glucose, Bld: 94 mg/dL (ref 70–99)
Potassium: 5 mmol/L (ref 3.5–5.1)
Sodium: 139 mmol/L (ref 135–145)
Total Bilirubin: <0.2 mg/dL (ref 0.0–1.2)
Total Protein: 5.9 g/dL — ABNORMAL LOW (ref 6.5–8.1)

## 2024-01-09 LAB — SAMPLE TO BLOOD BANK

## 2024-01-09 LAB — FERRITIN: Ferritin: 1175 ng/mL — ABNORMAL HIGH (ref 11–307)

## 2024-01-09 MED ORDER — PALONOSETRON HCL INJECTION 0.25 MG/5ML: 0.2500 mg | Freq: Once | INTRAVENOUS | Status: DC

## 2024-01-09 MED ORDER — SODIUM CHLORIDE 0.9 % IV SOLN
150.0000 mg | Freq: Once | INTRAVENOUS | Status: DC
  Filled 2024-01-09: qty 5

## 2024-01-09 MED ORDER — SODIUM CHLORIDE 0.9 % IV SOLN
400.0000 mg/m2 | Freq: Once | INTRAVENOUS | Status: AC
  Administered 2024-01-09: 600 mg via INTRAVENOUS
  Filled 2024-01-09: qty 20

## 2024-01-09 MED ORDER — DEXAMETHASONE SOD PHOSPHATE PF 10 MG/ML IJ SOLN: 10.0000 mg | Freq: Once | INTRAMUSCULAR | Status: DC

## 2024-01-09 MED ORDER — SODIUM CHLORIDE 0.9 % IV SOLN: INTRAVENOUS | Status: DC

## 2024-01-09 MED ORDER — CYANOCOBALAMIN 1000 MCG/ML IJ SOLN
1000.0000 ug | Freq: Once | INTRAMUSCULAR | Status: AC
  Administered 2024-01-09: 1000 ug via INTRAMUSCULAR
  Filled 2024-01-09: qty 1

## 2024-01-09 MED ORDER — PROCHLORPERAZINE MALEATE 10 MG PO TABS
10.0000 mg | ORAL_TABLET | Freq: Once | ORAL | Status: AC
  Administered 2024-01-09: 10 mg via ORAL
  Filled 2024-01-09: qty 1

## 2024-01-09 NOTE — Patient Instructions (Signed)
 [facility] - A DEPT OF [facility]  Discharge Instructions: Thank you for choosing [facility] to provide your oncology and hematology care.   If you have a lab appointment with the Cancer Center, please go directly to the Cancer Center and check in at the registration area.   Wear comfortable clothing and clothing appropriate for easy access to any Portacath or PICC line.   We strive to give you quality time with your provider. You may need to reschedule your appointment if you arrive late (15 or more minutes).  Arriving late affects you and other patients whose appointments are after yours.  Also, if you miss three or more appointments without notifying the office, you may be dismissed from the clinic at the provider's discretion.      For prescription refill requests, have your pharmacy contact our office and allow 72 hours for refills to be completed.    Today you received the following chemotherapy and/or immunotherapy agents alimta      To help prevent nausea and vomiting after your treatment, we encourage you to take your nausea medication as directed.  BELOW ARE SYMPTOMS THAT SHOULD BE REPORTED IMMEDIATELY: *FEVER GREATER THAN 100.4 F (38 C) OR HIGHER *CHILLS OR SWEATING *NAUSEA AND VOMITING THAT IS NOT CONTROLLED WITH YOUR NAUSEA MEDICATION *UNUSUAL SHORTNESS OF BREATH *UNUSUAL BRUISING OR BLEEDING *URINARY PROBLEMS (pain or burning when urinating, or frequent urination) *BOWEL PROBLEMS (unusual diarrhea, constipation, pain near the anus) TENDERNESS IN MOUTH AND THROAT WITH OR WITHOUT PRESENCE OF ULCERS (sore throat, sores in mouth, or a toothache) UNUSUAL RASH, SWELLING OR PAIN  UNUSUAL VAGINAL DISCHARGE OR ITCHING   Items with * indicate a potential emergency and should be followed up as soon as possible or go to the Emergency Department if any problems should occur.  Please show the CHEMOTHERAPY ALERT CARD or IMMUNOTHERAPY  ALERT CARD at check-in to the Emergency Department and triage nurse.  Should you have questions after your visit or need to cancel or reschedule your appointment, please contact [facility] - A DEPT OF [facility]  Dept: [phone]  and follow the prompts.  Office hours are 8:00 a.m. to 4:30 p.m. Monday - Friday. Please note that voicemails left after 4:00 p.m. may not be returned until the following business day.  We are closed weekends and major holidays. You have access to a nurse at all times for urgent questions. Please call the main number to the clinic Dept: [phone] and follow the prompts.   For any non-urgent questions, you may also contact your provider using MyChart. We now offer e-Visits for anyone 50 and older to request care online for non-urgent symptoms. For details visit [email or web address].   Also download the MyChart app! Go to the app store, search "MyChart", open the app, select [facility], and log in with your MyChart username and password.

## 2024-01-09 NOTE — Progress Notes (Signed)
 Nutrition Assessment   Reason for Assessment: Referral - wt loss    ASSESSMENT: 73 year old female with recurrent NSCLC (diagnosed 2021). S/p concurrent chemoRT followed by 42 months Tagrisso . She is currently receiving carboplatin  + alimta + tagrisso . Carboplatin  removed starting cycle 3 due to cytopenias.   Past medical history includes HTN, internal hemorrhoids, pneumothorax, COPD, chronic asthma, GERD, diverticulosis, hypothyroidism, vit D deficiency, HLD, sev PCM  11/2-11/7 - admission with colitis  Met with patient in infusion. She reports feeling great since hospital discharge. Her appetite has improved and weights are increasing. Says she has gained 2 pounds on home scale in the last 2 weeks. Patient tolerating regular diet. Eating small meals/snacks. Yesterday had yogurt and premier protein for breakfast, peanut butter on bagel for snack, ham/cheese sandwich and lobster bisque for lunch, snacked on cottage cheese and peaches for snack. Ate fried chicken, mashed potatoes, and slaw for dinner.   Nutrition Focused Physical Exam: deferred - noted pt diagnosed with severe malnutrition 12/23/23 by inpt RD   Medications: mylanta, B12, lomotil , pepcid , ferrous sulfate , folvite , norco, synthroid , ativan , mag-ox, hiprex, zofran -odt, oxycodone , compazine , aciphex , crestor , NaCl tablet, trimpex   Labs: BUN 36   Anthropometrics: Reports significant wt loss s/p cholecystectomy (May 2025)  Height: 5'5 Weight: 122 lb  UBW: 163 lb (03/13/23)  BMI: 23.83   NUTRITION DIAGNOSIS: Unintended wt loss related to cancer and associated treatment side effects as evidenced by hospitalization with toxigenic C. Diff, 25% wt loss from usual wt in 10 months which is severe for time frame   MALNUTRITION DIAGNOSIS: Severe malnutrition continues    INTERVENTION:  Continue strategies for increasing calories and protein with small frequent meals/snacks Continue premier protein    MONITORING, EVALUATION,  GOAL: Pt will tolerate increased calories and protein to promote wt gain   Next Visit: Thursday December 11 during infusion with [facility]

## 2024-01-10 ENCOUNTER — Telehealth: Payer: Self-pay

## 2024-01-10 ENCOUNTER — Encounter: Payer: Self-pay | Admitting: Radiation Oncology

## 2024-01-10 ENCOUNTER — Other Ambulatory Visit: Payer: Self-pay | Admitting: Student

## 2024-01-10 NOTE — H&P (Signed)
 Chief Complaint: Metastatic left lung adenocarcinoma ; referred for image guided Port-A-Cath placement to assist with treatment  Referring Provider(s): Sherrod HERO  Supervising Physician: Jenna Hacker  Patient Status: [facility] - Out-pt  History of Present Illness: Linda Woods is a 73 y.o. female with past medical history significant for anemia, anxiety, asthma, reflux esophagitis/GERD, diverticulosis, recent C. difficile colitis, nephrolithiasis, hypertension, osteoarthritis, hyperlipidemia, RLS as well as metastatic left lung cancer initially diagnosed in 2021.  She has poor IV access and is scheduled today for Port-A-Cath placement to assist with additional treatment.  She is known to IR team from L1 kyphoplasty on 07/03/2023 and left retroperitoneal lymph node biopsy on 12/09/2023 revealing metastatic adenocarcinoma.  *** Patient is Full Code  Past Medical History:  Diagnosis Date   Anemia    as a child   Anxiety    Asthma 10/19/2020   Bursitis    Chronic reflux esophagitis    Complication of anesthesia    Diarrhea, functional    Diverticulosis    GERD (gastroesophageal reflux disease)    History of kidney stones    History of radiation therapy    Lumbar Spine-12/02/23-12/16/23- Dr. Lynwood Nasuti   Hypertension    Knee pain    right knee-seeing ortho   lung ca 09/2019   Osteoarthritis    Plantar fasciitis    Pneumonia    PONV (postoperative nausea and vomiting)    has used the patch before and that helps   Pure hypercholesterolemia    RLS (restless legs syndrome)    Superficial vein thrombosis    Thyroid  disease    hypothyroid    Past Surgical History:  Procedure Laterality Date   APPENDECTOMY     BRONCHIAL BIOPSY  10/20/2019   Procedure: BRONCHIAL BIOPSIES;  Surgeon: Shelah Lamar RAMAN, MD;  Location: [facility] ENDOSCOPY;  Service: Pulmonary;;   BRONCHIAL BRUSHINGS  10/20/2019   Procedure: BRONCHIAL BRUSHINGS;  Surgeon: Shelah Lamar RAMAN, MD;  Location: [facility]  ENDOSCOPY;  Service: Pulmonary;;   BRONCHIAL NEEDLE ASPIRATION BIOPSY  10/20/2019   Procedure: BRONCHIAL NEEDLE ASPIRATION BIOPSIES;  Surgeon: Shelah Lamar RAMAN, MD;  Location: [facility] ENDOSCOPY;  Service: Pulmonary;;   BRONCHIAL WASHINGS  10/20/2019   Procedure: BRONCHIAL WASHINGS;  Surgeon: Shelah Lamar RAMAN, MD;  Location: [facility] ENDOSCOPY;  Service: Pulmonary;;   CARPAL TUNNEL RELEASE Right 2011   Dr. Camella   CHOLECYSTECTOMY N/A    COLPORRHAPHY     posterior   HAND SURGERY Right 2016   Nerve surgery, Dr. Camella   IR KYPHO LUMBAR INC FX REDUCE BONE BX UNI/BIL CANNULATION INC/IMAGING  07/03/2023   OVARIAN CYST REMOVAL     RECTOCELE REPAIR  2011   w/TVH and sling   TONSILLECTOMY     TONSILLECTOMY     TOTAL KNEE ARTHROPLASTY Left 09/09/2018   Procedure: TOTAL KNEE ARTHROPLASTY, CORTISONE INJECTION RIGHT KNEE;  Surgeon: Ernie Cough, MD;  Location: [facility] ORS;  Service: Orthopedics;  Laterality: Left;  70 mins   TOTAL KNEE ARTHROPLASTY Right 04/16/2023   Procedure: TOTAL KNEE ARTHROPLASTY;  Surgeon: Ernie Cough, MD;  Location: [facility] ORS;  Service: Orthopedics;  Laterality: Right;   TOTAL VAGINAL HYSTERECTOMY  10/18/2009   rectocele repair, sling   TUBAL LIGATION Bilateral    VARICOSE VEIN SURGERY     VIDEO BRONCHOSCOPY WITH ENDOBRONCHIAL NAVIGATION N/A 10/20/2019   Procedure: VIDEO BRONCHOSCOPY WITH ENDOBRONCHIAL NAVIGATION;  Surgeon: Shelah Lamar RAMAN, MD;  Location: [facility] ENDOSCOPY;  Service: Pulmonary;  Laterality: N/A;    Allergies:  Patient has no known allergies.  Medications: Prior to Admission medications   Medication Sig Start Date End Date Taking? Authorizing Provider  albuterol  (VENTOLIN  HFA) 108 (90 Base) MCG/ACT inhaler Inhale 2 puffs into the lungs every 6 (six) hours as needed for wheezing or shortness of breath. 01/21/23   Chandra Harlene LABOR, NP  alum & mag hydroxide-simeth (MAALOX/MYLANTA) 200-200-20 MG/5ML suspension Take 15 mLs by mouth every 6 (six) hours as needed for indigestion  or heartburn. Patient not taking: Reported on 01/09/2024 05/30/23   Willette Adriana LABOR, MD  chlorhexidine  (HIBICLENS ) 4 % external liquid Apply 15 mLs (1 Application total) topically as directed for 30 doses. Use as directed daily for 5 days every other week for 6 weeks. Patient not taking: Reported on 01/09/2024 04/16/23   Patti Rosina SAUNDERS, PA-C  cyanocobalamin  (VITAMIN B12) 1000 MCG tablet Take 1 tablet (1,000 mcg total) by mouth daily. 12/27/23   Darci Pore, MD  diphenoxylate -atropine  (LOMOTIL ) 2.5-0.025 MG tablet Take 1 tablet by mouth 4 (four) times daily as needed for diarrhea or loose stools. 12/17/23   Heilingoetter, Cassandra L, PA-C  famotidine  (PEPCID ) 40 MG tablet Take 40 mg by mouth at bedtime.    [provider]  feeding supplement (ENSURE PLUS HIGH PROTEIN) LIQD Take 237 mLs by mouth 2 (two) times daily between meals. 12/27/23   Darci Pore, MD  ferrous sulfate  325 (65 FE) MG tablet Take 1 tablet (325 mg total) by mouth daily with breakfast. 12/27/23   Darci Pore, MD  fexofenadine (ALLEGRA) 180 MG tablet Take 180 mg by mouth daily as needed for allergies.    [provider]  fluticasone  (FLONASE ) 50 MCG/ACT nasal spray Place 1 spray into both nostrils as needed for allergies. 10/01/19   [provider]  folic acid  (FOLVITE ) 1 MG tablet Take 1 tablet (1 mg total) by mouth daily. Start 7 days before pemetrexed  chemotherapy. Continue until 21 days after pemetrexed  completed. 11/12/23   Sherrod Sherrod, MD  HYDROcodone -acetaminophen  (NORCO/VICODIN) 5-325 MG tablet Take 1 tablet by mouth every 6 (six) hours as needed for moderate pain (pain score 4-6). 11/19/23   Shannon Agent, MD  levothyroxine  (SYNTHROID ) 100 MCG tablet Take 1 tablet (100 mcg total) by mouth daily. One po qd 11/13/22   Cleotilde Ronal RAMAN, MD  lidocaine  (LIDODERM ) 5 % Place 1 patch onto the skin daily as needed (back pain). 04/26/20   [provider]   lidocaine -prilocaine  (EMLA ) cream Apply 1 Application topically as needed. 12/31/23 01/30/24  Heilingoetter, Cassandra L, PA-C  LORazepam  (ATIVAN ) 0.5 MG tablet Take 0.5 mg by mouth in the morning and at bedtime.    [provider]  magnesium  oxide (MAG-OX) 400 MG tablet Take 1 tablet (400 mg total) by mouth daily. 12/27/23   Darci Pore, MD  methenamine  (HIPREX ) 1 g tablet Take 1 tablet (1 g total) by mouth 2 (two) times daily with a meal. Patient not taking: Reported on 01/09/2024 12/03/23 11/27/24  Overton Constance DASEN, MD  Multiple Vitamin (MULTIVITAMIN WITH MINERALS) TABS tablet Take 1 tablet by mouth daily. 12/28/23   Darci Pore, MD  MYRBETRIQ  50 MG TB24 tablet Take 50 mg by mouth daily. 02/08/21   [provider]  ondansetron  (ZOFRAN -ODT) 4 MG disintegrating tablet Take 4-8 mg by mouth every 6 (six) hours as needed. 12/11/23   [provider]  osimertinib  mesylate (TAGRISSO ) 80 MG tablet Take 1 tablet (80 mg total) by mouth daily. 11/12/23   Sherrod Sherrod, MD  oxyCODONE  (OXY IR/ROXICODONE )  5 MG immediate release tablet Take 5 mg by mouth every 6 (six) hours as needed for severe pain (pain score 7-10). Patient not taking: Reported on 12/25/2023 06/20/23   [provider]  potassium chloride  SA (KLOR-CON  M) 20 MEQ tablet Take 1 tablet (20 mEq total) by mouth daily for 10 days. 12/27/23 01/09/24  Darci Pore, MD  PREMARIN vaginal cream Place 1 applicator vaginally as needed (urinary issues). 12/19/21   [provider]  prochlorperazine  (COMPAZINE ) 10 MG tablet Take 1 tablet (10 mg total) by mouth every 6 (six) hours as needed for nausea or vomiting. 11/12/23   Sherrod Sherrod, MD  RABEprazole  (ACIPHEX ) 20 MG tablet Take 1 tablet (20 mg total) by mouth 2 (two) times a week. 03/08/16   Cleotilde Ronal RAMAN, MD  rosuvastatin  (CRESTOR ) 5 MG tablet Take 1 tablet (5 mg total) by mouth daily. 01/02/23   Johnson, Laymon HERO, PA-C  sodium chloride  1  g tablet Take 1 tablet (1 g total) by mouth 2 (two) times daily with a meal. Patient not taking: Reported on 01/09/2024 11/21/23   Sherrod Sherrod, MD  STIOLTO RESPIMAT  2.5-2.5 MCG/ACT AERS INHALE TWO PUFFS INTO THE LUNGS DAILY 10/16/23   Hope Almarie ORN, NP  triamcinolone  cream (KENALOG ) 0.1 % Apply 1 Application topically 2 (two) times daily. As needed for itching/rash 07/20/22   Heilingoetter, Cassandra L, PA-C  trimethoprim (TRIMPEX) 100 MG tablet Take 100 mg by mouth daily. Patient not taking: Reported on 01/09/2024    [provider]     Family History  Problem Relation Age of Onset   Cervical cancer Mother    Heart failure Mother    Heart failure Father    Diabetes Father    Cancer Brother    Breast cancer Maternal Aunt    Breast cancer Paternal Aunt     Social History   Socioeconomic History   Marital status: Married    Spouse name: Not on file   Number of children: 3   Years of education: Not on file   Highest education level: Not on file  Occupational History    Employer: SELF EMPLOYED  Tobacco Use   Smoking status: Never   Smokeless tobacco: Never  Vaping Use   Vaping status: Never Used  Substance and Sexual Activity   Alcohol use: Not Currently    Alcohol/week: 1.0 standard drink of alcohol    Types: 1 Glasses of wine per week   Drug use: No   Sexual activity: Not Currently    Partners: Male    Birth control/protection: Surgical    Comment: Hysterectomy, BTL  Other Topics Concern   Not on file  Social History Narrative   Married, retired armed forces operational officer   Caffeine- coffee, 1 cup, occass tea   Education- BS   Children- 3   Social Drivers of Corporate Investment Banker Strain: Not on file  Food Insecurity: No Food Insecurity (12/22/2023)   Hunger Vital Sign    Worried About Running Out of Food in the Last Year: Never true    Ran Out of Food in the Last Year: Never true  Transportation Needs: No Transportation Needs (12/22/2023)   PRAPARE -  Administrator, Civil Service (Medical): No    Lack of Transportation (Non-Medical): No  Physical Activity: Not on file  Stress: Not on file  Social Connections: Moderately Integrated (12/22/2023)   Social Connection and Isolation Panel    Frequency of Communication with Friends and Family: More  than three times a week    Frequency of Social Gatherings with Friends and Family: Once a week    Attends Religious Services: More than 4 times per year    Active Member of Clubs or Organizations: No    Attends Banker Meetings: Patient unable to answer    Marital Status: Married       Review of Systems  Vital Signs: LMP 08/21/2002   Advance Care Plan: No documents on file   Physical Exam  Imaging: CT CHEST ABDOMEN PELVIS W CONTRAST Result Date: 12/22/2023 CLINICAL DATA:  Sepsis. EXAM: CT CHEST, ABDOMEN, AND PELVIS WITH CONTRAST TECHNIQUE: Multidetector CT imaging of the chest, abdomen and pelvis was performed following the standard protocol during bolus administration of intravenous contrast. RADIATION DOSE REDUCTION: This exam was performed according to the departmental dose-optimization program which includes automated exposure control, adjustment of the mA and/or kV according to patient size and/or use of iterative reconstruction technique. CONTRAST:  75mL OMNIPAQUE  IOHEXOL  350 MG/ML SOLN COMPARISON:  10/18/2023, 09/30/2023. FINDINGS: CT CHEST FINDINGS Cardiovascular: The heart is normal in size and there is a trace pericardial effusion. There is atherosclerotic calcification of the aorta without evidence of aneurysm. The pulmonary trunk is normal in caliber. Mediastinum/Nodes: No mediastinal, hilar, or axillary lymphadenopathy. The thyroid  gland, trachea, and esophagus are within normal limits. There is a moderate hiatal hernia. A cystic structure is noted in the posterior mediastinum measuring 2.9 cm, unchanged. Lungs/Pleura: Apical pleural scarring is noted on the  left. There is perihilar consolidation in the left upper lobe with scarring, bronchiectasis, and volume loss, measuring 4.7 x 3.2 cm versus 3.7 x 2.5 cm on the prior exam, axial image 63. Increasing consolidation is noted in the perihilar region on the right with associated architectural distortion. There is a stable nodular density in the right upper lobe measuring 5 mm, axial image 55. No effusion or pneumothorax is seen. Musculoskeletal: Degenerative changes are present in the thoracic spine. Stable sclerotic lesions are noted in the bones. No acute fracture is seen. CT ABDOMEN PELVIS FINDINGS Hepatobiliary: No focal abnormality in the liver. Fatty infiltration of the liver is present. The gallbladder is not seen. There is stable dilatation the biliary tree, query post cholecystectomy status. Pancreas: Unremarkable. No pancreatic ductal dilatation or surrounding inflammatory changes. Spleen: Normal in size without focal abnormality. Adrenals/Urinary Tract: A there is a right adrenal nodule measuring 1.3 x 1.0 cm, slightly increased in size. No adrenal nodule on the left. The kidneys enhance symmetrically. Renal cysts are present on the right. No renal calculus bilaterally. There is mild hydronephrosis on the left without evidence of obstructing stone common unchanged from previous exam. The bladder is unremarkable. Stomach/Bowel: There is a moderate hiatal hernia. The stomach is otherwise within normal limits. No bowel obstruction, free air, or pneumatosis is seen. Scattered diverticular present along the colon without evidence of diverticulitis. Fluid attenuation is noted throughout the colon. Appendix is not seen. Vascular/Lymphatic: Aortic atherosclerosis. Mildly enlarged lymph node is noted in the periaortic space on the left in the upper abdomen measuring 9 mm. Reproductive: Status post hysterectomy. No adnexal masses. Other: No abdominopelvic ascites. Musculoskeletal: Degenerative changes and stable  sclerotic lesions are noted in the bones. 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 There is a compression deformity with kyphoplasty changes at L1. IMPRESSION: 1. Diffuse fluid levels in the colon suggesting diarrheal illness. 2. Increasing perihilar airspace consolidation bilaterally, likely related to prior radiation changes, however superimposed is infection or  malignancy cannot be excluded. 3. Stable sclerotic lesions in the bones, compatible with known metastasis. 4. Aortic atherosclerosis. 5. Remaining stable findings as described above. Electronically Signed   By: Leita Birmingham M.D.   On: 12/22/2023 18:09   DG Chest Port 1 View Result Date: 12/22/2023 CLINICAL DATA:  Sepsis EXAM: PORTABLE CHEST 1 VIEW COMPARISON:  Chest radiograph dated 05/26/2023 FINDINGS: Normal lung volumes. Unchanged left apical and right perihilar scarring. No focal consolidations. No pleural effusion or pneumothorax. The heart size and mediastinal contours are within normal limits. No acute osseous abnormality. IMPRESSION: Unchanged scarring in the bilateral lungs.  No focal consolidations. Electronically Signed   By: Limin  Xu M.D.   On: 12/22/2023 17:52    Labs:  CBC: Recent Labs    12/26/23 0117 12/27/23 0434 01/02/24 1523 01/09/24 0955  WBC 1.3* 1.9* 3.4* 5.2  HGB 8.3* 7.4* 7.6* 9.2*  HCT 24.2* 21.8* 22.8* 27.2*  PLT 17* 50* 68* 171    COAGS: Recent Labs    07/03/23 0844 12/09/23 0751 12/22/23 1533  INR 1.2 1.1 1.1    BMP: Recent Labs    12/26/23 0117 12/27/23 0434 01/02/24 1523 01/09/24 0955  NA 139 138 139 139  K 3.3* 3.3* 4.5 5.0  CL 105 107 108 106  CO2 23 22 25 25   GLUCOSE 130* 89 111* 94  BUN 10 8 34* 36*  CALCIUM  7.6* 7.7* 8.8* 9.4  CREATININE 0.66 0.54 0.75 0.76  GFRNONAA >60 >60 >60 >60    LIVER FUNCTION TESTS: Recent Labs    12/22/23 1533 12/23/23 0420 12/24/23 0432 12/26/23 0117 12/27/23 0434 01/02/24 1523 01/09/24 0955  BILITOT 0.7 0.4  --   --   --   0.2 <0.2  AST 16 14*  --   --   --  21 36  ALT 13 10  --   --   --  25 60*  ALKPHOS 68 47  --   --   --  69 76  PROT 5.2* 3.7*  --   --   --  5.6* 5.9*  ALBUMIN 2.7* 1.8*   < > 2.1* 2.2* 3.6 4.0   < > = values in this interval not displayed.    TUMOR MARKERS: No results for input(s): AFPTM, CEA, CA199, CHROMGRNA in the last 8760 hours.  Assessment and Plan: 73 y.o. female with past medical history significant for anemia, anxiety, asthma, reflux esophagitis/GERD, diverticulosis, recent C. difficile colitis, nephrolithiasis, hypertension, osteoarthritis, hyperlipidemia, RLS as well as metastatic left lung cancer initially diagnosed in 2021.  She has poor IV access and is scheduled today for Port-A-Cath placement to assist with additional treatment.  She is known to IR team from L1 kyphoplasty on 07/03/2023 and left retroperitoneal lymph node biopsy on 12/09/2023 revealing metastatic adenocarcinoma.Risks and benefits of image guided port-a-catheter placement was discussed with the patient including, but not limited to bleeding, infection, pneumothorax, or fibrin sheath development and need for additional procedures.  All of the patient's questions were answered, patient is agreeable to proceed. Consent signed and in chart.    Thank you for allowing our service to participate in Linda Woods 's care.  Electronically Signed: D. Franky Rakers, PA-C   01/10/2024, 5:41 PM      I spent a total of   20 minutes  in face to face in clinical consultation, greater than 50% of which was counseling/coordinating care for Port-A-Cath placement

## 2024-01-10 NOTE — Telephone Encounter (Signed)
 Spoke with pt in regards to her My Chart message. Pt stated she had diarrhea this am x3 and none since then.  She was getting ready to eat lunch and see if any diarrhea from that.  Per Cassie pt needs to go to ED if diarrhea continues to evaluate for dehydration, possible recurrent c-diff. Told pt of Cassie's recommendation and pt wants to monitor before going to ER. Informed pt to call if any further diarrhea. Voiced understanding.

## 2024-01-11 ENCOUNTER — Other Ambulatory Visit: Payer: Self-pay | Admitting: Student

## 2024-01-12 NOTE — Progress Notes (Signed)
 Radiation Oncology         ([phone] ________________________________  Name: Linda Woods MRN: [id number]  Date: 01/13/2024  DOB: 08/29/1950  Follow-Up Visit Note  CC: Sheryle Carwin, MD  [name], MD  No diagnosis found.  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, adenocarcinoma with bone metastasis    Interval Since Last Radiation: approximately 1 month   2) Intent: Palliative  Radiation Treatment Dates: First Treatment Date: 2023-12-02 -- Last Treatment Date: 2023-12-16 Site/Dose/Technique/Mode:  Plan Name: Spine Site: Lumbar Spine Technique: 3D Mode: Photon Dose Per Fraction: 3 Gy Prescribed Dose (Delivered / Prescribed): 30 Gy / 30 Gy Prescribed Fxs (Delivered / Prescribed): 10 / 10  1)  Radiation Treatment Dates: 11/26/2019 through 01/06/2020 Site Technique Total Dose (Gy) Dose per Fx (Gy) Completed Fx Beam Energies  Lung, Left: Lung_Lt IMRT 46/46 2 23/23 6X  Lung, Left: Lung_Lt_Bst IMRT 14/14 2 7/7 6X   Narrative:  The patient returns today for a routine 1 month follow-up visit after completing radiation therapy to the lumbar spine. She tolerated radiation therapy relatively well overall other than nausea and vomiting which she received IV fluids for. Her N/V were attributed to medication given to her by infectious disease around the time of her final treatment. She otherwise noted improvement in her lower back pain with her first 7 radiation treatments. She was also able to ambulate better after her first 7 treatments.   In the interval since her initial consultation date of 09/30 (and since starting radiation therapy) she received her first dose of chemotherapy with Pemetrexed  (Alimta ) and Carboplatin  on 11/21/23 under the care of Dr. Sherrod. She has also continued to take Tagrisso  which she has been on since December of 2021 (targeted therapy). She tolerated her first cycle of therapy well other than some thrush (which  she also developed during her prior course of chemo). She was instructed to use Nystatin  oral rinse for management of this.   A needle core biopsy of the left paraaortic lymph node was also obtained around this time on 12/09/23. Pathology revealed metastatic adenocarcinoma, consistent with a lung primary. Dr. Sherrod also ordered foundation one testing on the sample which showed a TPS score of 1%.  Although she initially tolerated chemotherapy well, she had an episode of significant nausea with projectile vomiting and diarrhea on 10/21. She was seen by med-onc the day after her episode and was given IV antiemetics, Compazine , and IV fluids. Her nausea and vomiting eventually improved with IV fluids; her diarrhea also improved but remained mildly persistent.    She also developed progressive anemia which prompted a dose reduction with her second cycle delivered on 12/17/23.   She was also unfortunately hospitalized from 12/22/23 through 12/27/23 after presenting to the ED with 3 days of progressive nausea and diarrhea. She was ultimately admitted for management of sepsis due to C. difficile colitis. Her hospital course consisted of fidoxamyxin therapy as well as 1 unit PRBC and 2 units of platelets for pancytopenia. She was also started on granisetron for symptom management prior to discharge.  -Imaging performed while inpatient including a CT CAP w/ contrast (performed on 11/02) which demonstrated: diffuse fluid levels in the colon suggesting diarrheal illness, and increasing perihilar airspace consolidations bilaterally (likely related to prior radiation changes, however superimposed is infection or malignancy could not be entirely excluded). The sites of osseous metastatic disease otherwise appeared stable on this study.   She also received another blood  transfusion on 01/04/24.   Following her discharge, her condition was noted to have improved significantly during a follow-up visit with Dr. Sherrod  on 01/09/24. With regards to chemotherapy, Dr. Sherrod ultimately decided to discontinue carboplatin  at that time. She will now continue with alimta  alone.   ***   Allergies:  has no known allergies.  Meds: Current Outpatient Medications  Medication Sig Dispense Refill   albuterol  (VENTOLIN  HFA) 108 (90 Base) MCG/ACT inhaler Inhale 2 puffs into the lungs every 6 (six) hours as needed for wheezing or shortness of breath. 8.5 g 0   alum & mag hydroxide-simeth (MAALOX/MYLANTA) 200-200-20 MG/5ML suspension Take 15 mLs by mouth every 6 (six) hours as needed for indigestion or heartburn. (Patient not taking: Reported on 01/09/2024) 355 mL 0   chlorhexidine  (HIBICLENS ) 4 % external liquid Apply 15 mLs (1 Application total) topically as directed for 30 doses. Use as directed daily for 5 days every other week for 6 weeks. (Patient not taking: Reported on 01/09/2024) 946 mL 1   cyanocobalamin  (VITAMIN B12) 1000 MCG tablet Take 1 tablet (1,000 mcg total) by mouth daily. 30 tablet 1   diphenoxylate -atropine  (LOMOTIL ) 2.5-0.025 MG tablet Take 1 tablet by mouth 4 (four) times daily as needed for diarrhea or loose stools. 30 tablet 0   famotidine  (PEPCID ) 40 MG tablet Take 40 mg by mouth at bedtime.     feeding supplement (ENSURE PLUS HIGH PROTEIN) LIQD Take 237 mLs by mouth 2 (two) times daily between meals. 14220 mL 1   ferrous sulfate  325 (65 FE) MG tablet Take 1 tablet (325 mg total) by mouth daily with breakfast. 30 tablet 1   fexofenadine (ALLEGRA) 180 MG tablet Take 180 mg by mouth daily as needed for allergies.     fluticasone  (FLONASE ) 50 MCG/ACT nasal spray Place 1 spray into both nostrils as needed for allergies.     folic acid  (FOLVITE ) 1 MG tablet Take 1 tablet (1 mg total) by mouth daily. Start 7 days before pemetrexed  chemotherapy. Continue until 21 days after pemetrexed  completed. 100 tablet 3   HYDROcodone -acetaminophen  (NORCO/VICODIN) 5-325 MG tablet Take 1 tablet by mouth every 6 (six) hours  as needed for moderate pain (pain score 4-6). 30 tablet 0   levothyroxine  (SYNTHROID ) 100 MCG tablet Take 1 tablet (100 mcg total) by mouth daily. One po qd 90 tablet 0   lidocaine  (LIDODERM ) 5 % Place 1 patch onto the skin daily as needed (back pain).     lidocaine -prilocaine  (EMLA ) cream Apply 1 Application topically as needed. 30 g 2   LORazepam  (ATIVAN ) 0.5 MG tablet Take 0.5 mg by mouth in the morning and at bedtime.     magnesium  oxide (MAG-OX) 400 MG tablet Take 1 tablet (400 mg total) by mouth daily. 30 tablet 0   methenamine  (HIPREX ) 1 g tablet Take 1 tablet (1 g total) by mouth 2 (two) times daily with a meal. (Patient not taking: Reported on 01/09/2024) 60 tablet 11   Multiple Vitamin (MULTIVITAMIN WITH MINERALS) TABS tablet Take 1 tablet by mouth daily. 90 tablet 1   MYRBETRIQ  50 MG TB24 tablet Take 50 mg by mouth daily.     ondansetron  (ZOFRAN -ODT) 4 MG disintegrating tablet Take 4-8 mg by mouth every 6 (six) hours as needed.     osimertinib  mesylate (TAGRISSO ) 80 MG tablet Take 1 tablet (80 mg total) by mouth daily. 30 tablet 3   oxyCODONE  (OXY IR/ROXICODONE ) 5 MG immediate release tablet Take 5 mg by mouth every 6 (  six) hours as needed for severe pain (pain score 7-10). (Patient not taking: Reported on 12/25/2023)     potassium chloride  SA (KLOR-CON  M) 20 MEQ tablet Take 1 tablet (20 mEq total) by mouth daily for 10 days. 10 tablet 0   PREMARIN vaginal cream Place 1 applicator vaginally as needed (urinary issues).     prochlorperazine  (COMPAZINE ) 10 MG tablet Take 1 tablet (10 mg total) by mouth every 6 (six) hours as needed for nausea or vomiting. 30 tablet 1   RABEprazole  (ACIPHEX ) 20 MG tablet Take 1 tablet (20 mg total) by mouth 2 (two) times a week.     rosuvastatin  (CRESTOR ) 5 MG tablet Take 1 tablet (5 mg total) by mouth daily. 90 tablet 3   sodium chloride  1 g tablet Take 1 tablet (1 g total) by mouth 2 (two) times daily with a meal. (Patient not taking: Reported on 01/09/2024)  60 tablet 0   STIOLTO RESPIMAT  2.5-2.5 MCG/ACT AERS INHALE TWO PUFFS INTO THE LUNGS DAILY 4 g 5   triamcinolone  cream (KENALOG ) 0.1 % Apply 1 Application topically 2 (two) times daily. As needed for itching/rash 453.6 g 0   trimethoprim (TRIMPEX) 100 MG tablet Take 100 mg by mouth daily. (Patient not taking: Reported on 01/09/2024)     No current facility-administered medications for this encounter.    Physical Findings: The patient is in no acute distress. Patient is alert and oriented.  vitals were not taken for this visit. .  No significant changes. Lungs are clear to auscultation bilaterally. Heart has regular rate and rhythm. No palpable cervical, supraclavicular, or axillary adenopathy. Abdomen soft, non-tender, normal bowel sounds.   Lab Findings: Lab Results  Component Value Date   WBC 5.2 01/09/2024   HGB 9.2 (L) 01/09/2024   HCT 27.2 (L) 01/09/2024   MCV 98.9 01/09/2024   PLT 171 01/09/2024    Radiographic Findings: CT CHEST ABDOMEN PELVIS W CONTRAST Result Date: 12/22/2023 CLINICAL DATA:  Sepsis. EXAM: CT CHEST, ABDOMEN, AND PELVIS WITH CONTRAST TECHNIQUE: Multidetector CT imaging of the chest, abdomen and pelvis was performed following the standard protocol during bolus administration of intravenous contrast. RADIATION DOSE REDUCTION: This exam was performed according to the departmental dose-optimization program which includes automated exposure control, adjustment of the mA and/or kV according to patient size and/or use of iterative reconstruction technique. CONTRAST:  75mL OMNIPAQUE  IOHEXOL  350 MG/ML SOLN COMPARISON:  10/18/2023, 09/30/2023. FINDINGS: CT CHEST FINDINGS Cardiovascular: The heart is normal in size and there is a trace pericardial effusion. There is atherosclerotic calcification of the aorta without evidence of aneurysm. The pulmonary trunk is normal in caliber. Mediastinum/Nodes: No mediastinal, hilar, or axillary lymphadenopathy. The thyroid  gland, trachea, and  esophagus are within normal limits. There is a moderate hiatal hernia. A cystic structure is noted in the posterior mediastinum measuring 2.9 cm, unchanged. Lungs/Pleura: Apical pleural scarring is noted on the left. There is perihilar consolidation in the left upper lobe with scarring, bronchiectasis, and volume loss, measuring 4.7 x 3.2 cm versus 3.7 x 2.5 cm on the prior exam, axial image 63. Increasing consolidation is noted in the perihilar region on the right with associated architectural distortion. There is a stable nodular density in the right upper lobe measuring 5 mm, axial image 55. No effusion or pneumothorax is seen. Musculoskeletal: Degenerative changes are present in the thoracic spine. Stable sclerotic lesions are noted in the bones. No acute fracture is seen. CT ABDOMEN PELVIS FINDINGS Hepatobiliary: No focal abnormality in the liver. Fatty  infiltration of the liver is present. The gallbladder is not seen. There is stable dilatation the biliary tree, query post cholecystectomy status. Pancreas: Unremarkable. No pancreatic ductal dilatation or surrounding inflammatory changes. Spleen: Normal in size without focal abnormality. Adrenals/Urinary Tract: A there is a right adrenal nodule measuring 1.3 x 1.0 cm, slightly increased in size. No adrenal nodule on the left. The kidneys enhance symmetrically. Renal cysts are present on the right. No renal calculus bilaterally. There is mild hydronephrosis on the left without evidence of obstructing stone common unchanged from previous exam. The bladder is unremarkable. Stomach/Bowel: There is a moderate hiatal hernia. The stomach is otherwise within normal limits. No bowel obstruction, free air, or pneumatosis is seen. Scattered diverticular present along the colon without evidence of diverticulitis. Fluid attenuation is noted throughout the colon. Appendix is not seen. Vascular/Lymphatic: Aortic atherosclerosis. Mildly enlarged lymph node is noted in the  periaortic space on the left in the upper abdomen measuring 9 mm. Reproductive: Status post hysterectomy. No adnexal masses. Other: No abdominopelvic ascites. Musculoskeletal: Degenerative changes and stable sclerotic lesions are noted in the bones. 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 There is a compression deformity with kyphoplasty changes at L1. IMPRESSION: 1. Diffuse fluid levels in the colon suggesting diarrheal illness. 2. Increasing perihilar airspace consolidation bilaterally, likely related to prior radiation changes, however superimposed is infection or malignancy cannot be excluded. 3. Stable sclerotic lesions in the bones, compatible with known metastasis. 4. Aortic atherosclerosis. 5. Remaining stable findings as described above. Electronically Signed   By: Leita Birmingham M.D.   On: 12/22/2023 18:09   DG Chest Port 1 View Result Date: 12/22/2023 CLINICAL DATA:  Sepsis EXAM: PORTABLE CHEST 1 VIEW COMPARISON:  Chest radiograph dated 05/26/2023 FINDINGS: Normal lung volumes. Unchanged left apical and right perihilar scarring. No focal consolidations. No pleural effusion or pneumothorax. The heart size and mediastinal contours are within normal limits. No acute osseous abnormality. IMPRESSION: Unchanged scarring in the bilateral lungs.  No focal consolidations. Electronically Signed   By: Limin  Xu M.D.   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  ***  Plan:  ***   *** minutes of total time was spent for this patient encounter, including preparation, face-to-face counseling with the patient and coordination of care, physical exam, and documentation of the encounter. ____________________________________  Lynwood CHARM Nasuti, PhD, MD  This document serves as a record of services personally performed by Lynwood Nasuti, MD.  It was created on his behalf by Dorthy Fuse, a trained medical scribe. The creation of this record is based on the scribe's personal observations and the provider's statements to them. This document has been checked and approved by the attending provider.

## 2024-01-13 ENCOUNTER — Encounter: Payer: Self-pay | Admitting: Radiation Oncology

## 2024-01-13 ENCOUNTER — Other Ambulatory Visit (HOSPITAL_COMMUNITY): Payer: Self-pay

## 2024-01-13 ENCOUNTER — Ambulatory Visit (HOSPITAL_COMMUNITY)
Admission: RE | Admit: 2024-01-13 | Discharge: 2024-01-13 | Disposition: A | Discharge status: Home / Self Care | Source: Ambulatory Visit | Attending: Physician Assistant | Admitting: Physician Assistant

## 2024-01-13 ENCOUNTER — Inpatient Hospital Stay

## 2024-01-13 ENCOUNTER — Ambulatory Visit
Admission: RE | Admit: 2024-01-13 | Discharge: 2024-01-13 | Disposition: A | Discharge status: Home / Self Care | Source: Ambulatory Visit | Attending: Radiation Oncology | Admitting: Radiation Oncology

## 2024-01-13 ENCOUNTER — Encounter (HOSPITAL_COMMUNITY): Payer: Self-pay

## 2024-01-13 ENCOUNTER — Ambulatory Visit (HOSPITAL_COMMUNITY)
Admission: RE | Admit: 2024-01-13 | Discharge: 2024-01-13 | Disposition: A | Source: Ambulatory Visit | Attending: Physician Assistant

## 2024-01-13 VITALS — BP 104/68 | HR 114 | Temp 97.3°F | Resp 18 | Ht 60.0 in | Wt 119.2 lb

## 2024-01-13 DIAGNOSIS — J45909 Unspecified asthma, uncomplicated: Secondary | ICD-10-CM | POA: Insufficient documentation

## 2024-01-13 DIAGNOSIS — I10 Essential (primary) hypertension: Secondary | ICD-10-CM | POA: Insufficient documentation

## 2024-01-13 DIAGNOSIS — C7951 Secondary malignant neoplasm of bone: Secondary | ICD-10-CM | POA: Insufficient documentation

## 2024-01-13 DIAGNOSIS — C3490 Malignant neoplasm of unspecified part of unspecified bronchus or lung: Secondary | ICD-10-CM

## 2024-01-13 DIAGNOSIS — D649 Anemia, unspecified: Secondary | ICD-10-CM | POA: Insufficient documentation

## 2024-01-13 DIAGNOSIS — Z9221 Personal history of antineoplastic chemotherapy: Secondary | ICD-10-CM | POA: Insufficient documentation

## 2024-01-13 DIAGNOSIS — G2581 Restless legs syndrome: Secondary | ICD-10-CM | POA: Insufficient documentation

## 2024-01-13 DIAGNOSIS — K219 Gastro-esophageal reflux disease without esophagitis: Secondary | ICD-10-CM | POA: Insufficient documentation

## 2024-01-13 DIAGNOSIS — I700 Atherosclerosis of aorta: Secondary | ICD-10-CM | POA: Insufficient documentation

## 2024-01-13 DIAGNOSIS — R000 Tachycardia, unspecified: Secondary | ICD-10-CM | POA: Insufficient documentation

## 2024-01-13 DIAGNOSIS — F419 Anxiety disorder, unspecified: Secondary | ICD-10-CM | POA: Insufficient documentation

## 2024-01-13 DIAGNOSIS — Z79890 Hormone replacement therapy (postmenopausal): Secondary | ICD-10-CM | POA: Insufficient documentation

## 2024-01-13 DIAGNOSIS — E785 Hyperlipidemia, unspecified: Secondary | ICD-10-CM | POA: Insufficient documentation

## 2024-01-13 DIAGNOSIS — M47814 Spondylosis without myelopathy or radiculopathy, thoracic region: Secondary | ICD-10-CM | POA: Insufficient documentation

## 2024-01-13 DIAGNOSIS — C3492 Malignant neoplasm of unspecified part of left bronchus or lung: Secondary | ICD-10-CM | POA: Insufficient documentation

## 2024-01-13 DIAGNOSIS — K2100 Gastro-esophageal reflux disease with esophagitis, without bleeding: Secondary | ICD-10-CM | POA: Insufficient documentation

## 2024-01-13 DIAGNOSIS — A414 Sepsis due to anaerobes: Secondary | ICD-10-CM | POA: Insufficient documentation

## 2024-01-13 DIAGNOSIS — I3139 Other pericardial effusion (noninflammatory): Secondary | ICD-10-CM | POA: Insufficient documentation

## 2024-01-13 DIAGNOSIS — N281 Cyst of kidney, acquired: Secondary | ICD-10-CM | POA: Insufficient documentation

## 2024-01-13 DIAGNOSIS — K449 Diaphragmatic hernia without obstruction or gangrene: Secondary | ICD-10-CM | POA: Insufficient documentation

## 2024-01-13 DIAGNOSIS — Z79899 Other long term (current) drug therapy: Secondary | ICD-10-CM | POA: Insufficient documentation

## 2024-01-13 DIAGNOSIS — Z923 Personal history of irradiation: Secondary | ICD-10-CM | POA: Insufficient documentation

## 2024-01-13 DIAGNOSIS — C3412 Malignant neoplasm of upper lobe, left bronchus or lung: Secondary | ICD-10-CM | POA: Insufficient documentation

## 2024-01-13 HISTORY — DX: Personal history of irradiation: Z92.3

## 2024-01-13 LAB — CBC WITH DIFFERENTIAL (CANCER CENTER ONLY)
Abs Immature Granulocytes: 0.01 K/uL (ref 0.00–0.07)
Basophils Absolute: 0 K/uL (ref 0.0–0.1)
Basophils Relative: 1 %
Eosinophils Absolute: 0 K/uL (ref 0.0–0.5)
Eosinophils Relative: 1 %
HCT: 27.4 % — ABNORMAL LOW (ref 36.0–46.0)
Hemoglobin: 9.1 g/dL — ABNORMAL LOW (ref 12.0–15.0)
Immature Granulocytes: 0 %
Lymphocytes Relative: 6 %
Lymphs Abs: 0.2 K/uL — ABNORMAL LOW (ref 0.7–4.0)
MCH: 32.4 pg (ref 26.0–34.0)
MCHC: 33.2 g/dL (ref 30.0–36.0)
MCV: 97.5 fL (ref 80.0–100.0)
Monocytes Absolute: 0.1 K/uL (ref 0.1–1.0)
Monocytes Relative: 2 %
Neutro Abs: 3.8 K/uL (ref 1.7–7.7)
Neutrophils Relative %: 90 %
Platelet Count: 157 K/uL (ref 150–400)
RBC: 2.81 MIL/uL — ABNORMAL LOW (ref 3.87–5.11)
RDW: 17.5 % — ABNORMAL HIGH (ref 11.5–15.5)
Smear Review: NORMAL
WBC Count: 4.2 K/uL (ref 4.0–10.5)
nRBC: 0 % (ref 0.0–0.2)

## 2024-01-13 LAB — CMP (CANCER CENTER ONLY)
ALT: 42 U/L (ref 0–44)
AST: 36 U/L (ref 15–41)
Albumin: 4.1 g/dL (ref 3.5–5.0)
Alkaline Phosphatase: 74 U/L (ref 38–126)
Anion gap: 10 (ref 5–15)
BUN: 32 mg/dL — ABNORMAL HIGH (ref 8–23)
CO2: 23 mmol/L (ref 22–32)
Calcium: 9.4 mg/dL (ref 8.9–10.3)
Chloride: 107 mmol/L (ref 98–111)
Creatinine: 0.79 mg/dL (ref 0.44–1.00)
GFR, Estimated: >60 mL/min (ref >60)
Glucose, Bld: 98 mg/dL (ref 70–99)
Potassium: 4.3 mmol/L (ref 3.5–5.1)
Sodium: 140 mmol/L (ref 135–145)
Total Bilirubin: 0.2 mg/dL (ref 0.0–1.2)
Total Protein: 6.3 g/dL — ABNORMAL LOW (ref 6.5–8.1)

## 2024-01-13 LAB — SAMPLE TO BLOOD BANK

## 2024-01-13 MED ORDER — FENTANYL CITRATE (PF) 100 MCG/2ML IJ SOLN
INTRAMUSCULAR | Status: AC
  Filled 2024-01-13: qty 2

## 2024-01-13 MED ORDER — MIDAZOLAM HCL (PF) 2 MG/2ML IJ SOLN
INTRAMUSCULAR | Status: AC | PRN
  Administered 2024-01-13: 1 mg via INTRAVENOUS

## 2024-01-13 MED ORDER — MIDAZOLAM HCL 2 MG/2ML IJ SOLN
INTRAMUSCULAR | Status: AC
  Filled 2024-01-13: qty 2

## 2024-01-13 MED ORDER — FENTANYL CITRATE (PF) 100 MCG/2ML IJ SOLN
INTRAMUSCULAR | Status: AC | PRN
  Administered 2024-01-13: 50 ug via INTRAVENOUS

## 2024-01-13 MED ORDER — LIDOCAINE HCL 1 % IJ SOLN
INTRAMUSCULAR | Status: AC
  Filled 2024-01-13: qty 20

## 2024-01-13 MED ORDER — SODIUM CHLORIDE 0.9 % IV SOLN: INTRAVENOUS | Status: DC

## 2024-01-13 MED ORDER — LIDOCAINE HCL 1 % IJ SOLN
20.0000 mL | Freq: Once | INTRAMUSCULAR | Status: AC
  Administered 2024-01-13: 20 mL via INTRADERMAL

## 2024-01-13 MED ORDER — HEPARIN SOD (PORK) LOCK FLUSH 100 UNIT/ML IV SOLN
500.0000 [IU] | Freq: Once | INTRAVENOUS | Status: AC
  Administered 2024-01-13: 500 [IU] via INTRAVENOUS

## 2024-01-13 MED ORDER — HEPARIN SOD (PORK) LOCK FLUSH 100 UNIT/ML IV SOLN
INTRAVENOUS | Status: AC
  Filled 2024-01-13: qty 5

## 2024-01-13 NOTE — Procedures (Signed)
 Interventional Radiology Procedure Note  Procedure: Chest Port placement  Complications: None  Estimated Blood Loss: < 10 mL  Findings: Right jugular vein access for placement of a SL chest port.  Catheter tip at cavoatrial junction.  Cordella DELENA Banner, MD

## 2024-01-13 NOTE — Discharge Instructions (Signed)
 Implanted Port Insertion, Care After  The following information offers guidance on how to care for yourself after your procedure. Your health care provider may also give you more specific instructions. If you have problems or questions, contact your health care provider.  What can I expect after the procedure? After the procedure, it is common to have: Discomfort at the port insertion site. Bruising on the skin over the port. This should improve over 3-4 days.   Urgent needs - Interventional Radiology, clinic [phone] (mon-fri 8-5).   Wound - May remove dressing and shower in 24 to 48 hours.  Keep site clean and dry.  Replace with bandaid as needed.  Do not submerge in tub or water until site healing well. If closed with glue, glue will flake off on its own.   If ordered by your provider, may start Emla cream (or any other creams ointments or lotions) in 2 weeks or after incision is healed. Port is ready for use immediately.   After completion of treatment, your provider should have you set up for monthly port flushes.   Follow these instructions at home: [facility] care After your port is placed, you will get a manufacturer's information card. The card has information about your port. Keep this card with you at all times. Take care of the port as told by your health care provider. Ask your health care provider if you or a family member can get training for taking care of the port at home. A home health care nurse will be be available to help care for the port. Make sure to remember what type of port you have. Incision care     Follow instructions from your health care provider about how to take care of your port insertion site. Make sure you: Wash your hands with soap and water for at least 20 seconds before and after you change your bandage (dressing). If soap and water are not available, use hand sanitizer. Change your dressing as told by your health care provider. Leave stitches  (sutures), skin glue, or adhesive strips in place. These skin closures may need to stay in place for 2 weeks or longer. If adhesive strip edges start to loosen and curl up, you may trim the loose edges. Do not remove adhesive strips completely unless your health care provider tells you to do that. Check your port insertion site every day for signs of infection. Check for: Redness, swelling, or pain. Fluid or blood. Warmth. Pus or a bad smell. Activity Return to your normal activities as told by your health care provider. Ask your health care provider what activities are safe for you. You may have to avoid lifting. Ask your health care provider how much you can safely lift. General instructions Take over-the-counter and prescription medicines only as told by your health care provider. Do not take baths, swim, or use a hot tub until your health care provider approves. Ask your health care provider if you may take showers. You may only be allowed to take sponge baths. If you were given a sedative during the procedure, it can affect you for several hours. Do not drive or operate machinery until your health care provider says that it is safe. Wear a medical alert bracelet in case of an emergency. This will tell any health care providers that you have a port. Keep all follow-up visits. This is important. Contact a health care provider if: You cannot flush your port with saline as directed, or you cannot  draw blood from the port. You have a fever or chills. You have redness, swelling, or pain around your port insertion site. You have fluid or blood coming from your port insertion site. Your port insertion site feels warm to the touch. You have pus or a bad smell coming from the port insertion site. Get help right away if: You have chest pain or shortness of breath. You have bleeding from your port that you cannot control. These symptoms may be an emergency. Get help right away. Call 911. Do not  wait to see if the symptoms will go away. Do not drive yourself to the hospital. Summary Take care of the port as told by your health care provider. Keep the manufacturer's information card with you at all times. Change your dressing as told by your health care provider. Contact a health care provider if you have a fever or chills or if you have redness, swelling, or pain around your port insertion site. Keep all follow-up visits. This information is not intended to replace advice given to you by your health care provider. Make sure you discuss any questions you have with your health care provider. Document Revised: 08/09/2020 Document Reviewed: 08/09/2020 Elsevier Patient Education  2023 [employer]    Moderate Conscious Sedation  Adult  Care After (English)  After the procedure, it is common to have: Sleepiness for a few hours. Impaired judgment for a few hours. Trouble with balance. Nausea or vomiting if you eat too soon. Follow these instructions at home: For the time period you were told by your health care provider:  Rest. Do not participate in activities where you could fall or become injured. Do not drive or use machinery. Do not drink alcohol. Do not take sleeping pills or medicines that cause drowsiness. Do not make important decisions or sign legal documents. Do not take care of children on your own. Eating and drinking Follow instructions from your health care provider about what you may eat and drink. Drink enough fluid to keep your urine pale yellow. If you vomit: Drink clear fluids slowly and in small amounts as you are able. Clear fluids include water, ice chips, low-calorie sports drinks, and fruit juice that has water added to it (diluted fruit juice). Eat light and bland foods in small amounts as you are able. These foods include bananas, applesauce, rice, lean meats, toast, and crackers. General instructions Take over-the-counter and prescription medicines  only as told by your health care provider. Have a responsible adult stay with you for the time you are told. Do not use any products that contain nicotine or tobacco. These products include cigarettes, chewing tobacco, and vaping devices, such as e-cigarettes. If you need help quitting, ask your health care provider. Return to your normal activities as told by your health care provider. Ask your health care provider what activities are safe for you. Your health care provider may give you more instructions. Make sure you know what you can and cannot do. Contact a health care provider if: You are still sleepy or having trouble with balance after 24 hours. You feel light-headed. You vomit every time you eat or drink. You get a rash. You have a fever. You have redness or swelling around the IV site. Get help right away if: You have trouble breathing. You start to feel confused at home. These symptoms may be an emergency. Get help right away. Call 911. Do not wait to see if the symptoms will go away. Do not  drive yourself to the hospital. This information is not intended to replace advice given to you by your health care provider. Make sure you discuss any questions you have with your health care provider.

## 2024-01-13 NOTE — Progress Notes (Signed)
 Linda Woods is here today for follow up post radiation to the lumbar spine.  Linda Woods completed their radiation on: 12/16/23   Does the patient complain of any of the following:  Pain: Yes continues to have back pain with ambulation. Rates pain at 8/10 at times.  Patient taking 2 hydrocodone -apap  5-325mg  daily.  Diarrhea/Constipation: Yes reports diarrhea at times.  Nausea/Vomiting: Yes, nausea that was relived with compazine .  Post radiation skin changes: No   Additional comments if applicable:  BP 104/68 (BP Location: Left Arm, Patient Position: Sitting)   Pulse (!) 114   Temp (!) 97.3 F (36.3 C) (Temporal)   Resp 18   Ht 5' (1.524 m)   Wt 119 lb 4 oz (54.1 kg)   LMP 08/21/2002   SpO2 100%   BMI 23.29 kg/m

## 2024-01-15 ENCOUNTER — Inpatient Hospital Stay

## 2024-01-22 ENCOUNTER — Other Ambulatory Visit

## 2024-01-22 ENCOUNTER — Ambulatory Visit: Payer: Self-pay | Admitting: Neurology

## 2024-01-22 ENCOUNTER — Ambulatory Visit: Admitting: Physician Assistant

## 2024-01-22 ENCOUNTER — Encounter: Payer: Self-pay | Admitting: Neurology

## 2024-01-22 ENCOUNTER — Ambulatory Visit

## 2024-01-22 VITALS — BP 119/73 | HR 106 | Ht 64.0 in | Wt 122.0 lb

## 2024-01-22 DIAGNOSIS — R634 Abnormal weight loss: Secondary | ICD-10-CM | POA: Diagnosis not present

## 2024-01-22 DIAGNOSIS — G629 Polyneuropathy, unspecified: Secondary | ICD-10-CM

## 2024-01-22 DIAGNOSIS — E569 Vitamin deficiency, unspecified: Secondary | ICD-10-CM

## 2024-01-22 DIAGNOSIS — R4189 Other symptoms and signs involving cognitive functions and awareness: Secondary | ICD-10-CM | POA: Diagnosis not present

## 2024-01-22 NOTE — Patient Instructions (Signed)
 I saw you today for numbness and tingling in your feet and hands. 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 You also have pre-diabetes which is a risk factor for neuropathy.  I would like to check labs today to look for other treatable causes.  You also mentioned the episode in [place] and that they were looking for meningitis because you were not acting like yourself. I want to get a new MRI brain to make sure everything looks okay.  I will be in touch when I have these results.  For the numbness and tingling: Alpha lipoic acid 600mg  daily has some research data suggesting it helps with nerve health. No major side effects other than <1% of people report upset stomach. This can be taken twice per day (1200mg  daily) if no relief obtained. You can buy this over the counter or online.  You can also try Lidocaine  cream as needed. Apply wear you have pain, tingling, or burning. Wear gloves to prevent your hands being numb. This can be bought over the counter at any drug store or online.  Continue PT.  I will see you again in 6 months. Please let me know if you have any questions or concerns in the meantime.   The physicians and staff at [facility] are committed to providing excellent care. You may receive a survey requesting feedback about your experience at our office. We strive to receive very good responses to the survey questions. If you feel that your experience would prevent you from giving the office a very good  response, please contact our office to try to remedy the situation. We may be reached at [phone]. Thank you for taking the time out of your busy day to complete the survey.  Venetia Potters, MD [facility]  Preventing Falls at [facility] are common, often dreaded events in the lives of older people. Aside from the obvious injuries and even death that may result, fall can cause wide-ranging consequences  including loss of independence, mental decline, decreased activity and mobility. Younger people are also at risk of falling, especially those with chronic illnesses and fatigue.  Ways to reduce risk for falling Examine diet and medications. Warm foods and alcohol dilate blood vessels, which can lead to dizziness when standing. Sleep aids, antidepressants and pain medications can also increase the likelihood of a fall.  Get a vision exam. Poor vision, cataracts and glaucoma increase the chances of falling.  Check foot gear. Shoes should fit snugly and have a sturdy, nonskid sole and a broad, low heel  Participate in a physician-approved exercise program to build and maintain muscle strength and improve balance and coordination. Programs that use ankle weights or stretch bands are excellent for muscle-strengthening. Water  aerobics programs and low-impact Tai Chi programs have also been shown to improve balance and coordination.  Increase vitamin D  intake. Vitamin D  improves muscle strength and increases the amount of calcium  the body is able to absorb and deposit in bones.  How to prevent falls from common hazards Floors - Remove all loose wires, cords, and throw rugs. Minimize clutter. Make sure rugs are anchored and smooth. Keep furniture in its usual place.  Chairs -- Use chairs with straight backs, armrests and firm seats. Add firm cushions to existing pieces to add height.  Bathroom - Install grab bars and non-skid tape in the tub or shower. Use a bathtub transfer bench or a shower chair with  a back support Use an elevated toilet seat and/or safety rails to assist standing from a low surface. Do not use towel racks or bathroom tissue holders to help you stand.  Lighting - Make sure halls, stairways, and entrances are well-lit. Install a night light in your bathroom or hallway. Make sure there is a light switch at the top and bottom of the staircase. Turn lights on if you get up in the middle  of the night. Make sure lamps or light switches are within reach of the bed if you have to get up during the night.  Kitchen - Install non-skid rubber mats near the sink and stove. Clean spills immediately. Store frequently used utensils, pots, pans between waist and eye level. This helps prevent reaching and bending. Sit when getting things out of lower cupboards.  Living room/ Bedrooms - Place furniture with wide spaces in between, giving enough room to move around. Establish a route through the living room that gives you something to hold onto as you walk.  Stairs - Make sure treads, rails, and rugs are secure. Install a rail on both sides of the stairs. If stairs are a threat, it might be helpful to arrange most of your activities on the lower level to reduce the number of times you must climb the stairs.  Entrances and doorways - Install metal handles on the walls adjacent to the doorknobs of all doors to make it more secure as you travel through the doorway.  Tips for maintaining balance Keep at least one hand free at all times. Try using a backpack or fanny pack to hold things rather than carrying them in your hands. Never carry objects in both hands when walking as this interferes with keeping your balance.  Attempt to swing both arms from front to back while walking. This might require a conscious effort if Parkinson's disease has diminished your movement. It will, however, help you to maintain balance and posture, and reduce fatigue.  Consciously lift your feet off of the ground when walking. Shuffling and dragging of the feet is a common culprit in losing your balance.  When trying to navigate turns, use a U technique of facing forward and making a wide turn, rather than pivoting sharply.  Try to stand with your feet shoulder-length apart. When your feet are close together for any length of time, you increase your risk of losing your balance and falling.  Do one thing at a time.  Don't try to walk and accomplish another task, such as reading or looking around. The decrease in your automatic reflexes complicates motor function, so the less distraction, the better.  Do not wear rubber or gripping soled shoes, they might catch on the floor and cause tripping.  Move slowly when changing positions. Use deliberate, concentrated movements and, if needed, use a grab bar or walking aid. Count 15 seconds between each movement. For example, when rising from a seated position, wait 15 seconds after standing to begin walking.  If balance is a continuous problem, you might want to consider a walking aid such as a cane, walking stick, or walker. Once you've mastered walking with help, you might be ready to try it on your own again.

## 2024-01-23 ENCOUNTER — Telehealth: Payer: Self-pay | Admitting: Physician Assistant

## 2024-01-23 ENCOUNTER — Other Ambulatory Visit: Payer: Self-pay

## 2024-01-23 ENCOUNTER — Inpatient Hospital Stay: Attending: Internal Medicine

## 2024-01-23 ENCOUNTER — Inpatient Hospital Stay

## 2024-01-23 DIAGNOSIS — D649 Anemia, unspecified: Secondary | ICD-10-CM | POA: Insufficient documentation

## 2024-01-23 DIAGNOSIS — M25552 Pain in left hip: Secondary | ICD-10-CM | POA: Insufficient documentation

## 2024-01-23 DIAGNOSIS — D61810 Antineoplastic chemotherapy induced pancytopenia: Secondary | ICD-10-CM | POA: Diagnosis not present

## 2024-01-23 DIAGNOSIS — D6481 Anemia due to antineoplastic chemotherapy: Secondary | ICD-10-CM

## 2024-01-23 DIAGNOSIS — C3490 Malignant neoplasm of unspecified part of unspecified bronchus or lung: Secondary | ICD-10-CM

## 2024-01-23 DIAGNOSIS — C3412 Malignant neoplasm of upper lobe, left bronchus or lung: Secondary | ICD-10-CM | POA: Diagnosis present

## 2024-01-23 DIAGNOSIS — Z5111 Encounter for antineoplastic chemotherapy: Secondary | ICD-10-CM | POA: Insufficient documentation

## 2024-01-23 DIAGNOSIS — C7951 Secondary malignant neoplasm of bone: Secondary | ICD-10-CM | POA: Diagnosis not present

## 2024-01-23 LAB — CBC WITH DIFFERENTIAL (CANCER CENTER ONLY)
Abs Immature Granulocytes: 0.05 K/uL (ref 0.00–0.07)
Basophils Absolute: 0 K/uL (ref 0.0–0.1)
Basophils Relative: 0 %
Eosinophils Absolute: 0.1 K/uL (ref 0.0–0.5)
Eosinophils Relative: 2 %
HCT: 23.4 % — ABNORMAL LOW (ref 36.0–46.0)
Hemoglobin: 7.9 g/dL — ABNORMAL LOW (ref 12.0–15.0)
Immature Granulocytes: 1 %
Lymphocytes Relative: 9 %
Lymphs Abs: 0.4 K/uL — ABNORMAL LOW (ref 0.7–4.0)
MCH: 33.1 pg (ref 26.0–34.0)
MCHC: 33.8 g/dL (ref 30.0–36.0)
MCV: 97.9 fL (ref 80.0–100.0)
Monocytes Absolute: 0.9 K/uL (ref 0.1–1.0)
Monocytes Relative: 23 %
Neutro Abs: 2.6 K/uL (ref 1.7–7.7)
Neutrophils Relative %: 65 %
Platelet Count: 114 K/uL — ABNORMAL LOW (ref 150–400)
RBC: 2.39 MIL/uL — ABNORMAL LOW (ref 3.87–5.11)
RDW: 18.6 % — ABNORMAL HIGH (ref 11.5–15.5)
WBC Count: 3.9 K/uL — ABNORMAL LOW (ref 4.0–10.5)
nRBC: 0 % (ref 0.0–0.2)

## 2024-01-23 LAB — SAMPLE TO BLOOD BANK

## 2024-01-23 LAB — CMP (CANCER CENTER ONLY)
ALT: 35 U/L (ref 0–44)
AST: 37 U/L (ref 15–41)
Albumin: 4.1 g/dL (ref 3.5–5.0)
Alkaline Phosphatase: 73 U/L (ref 38–126)
Anion gap: 11 (ref 5–15)
BUN: 31 mg/dL — ABNORMAL HIGH (ref 8–23)
CO2: 22 mmol/L (ref 22–32)
Calcium: 9.1 mg/dL (ref 8.9–10.3)
Chloride: 107 mmol/L (ref 98–111)
Creatinine: 0.82 mg/dL (ref 0.44–1.00)
GFR, Estimated: >60 mL/min (ref >60)
Glucose, Bld: 89 mg/dL (ref 70–99)
Potassium: 3.8 mmol/L (ref 3.5–5.1)
Sodium: 139 mmol/L (ref 135–145)
Total Bilirubin: <0.2 mg/dL (ref 0.0–1.2)
Total Protein: 6.2 g/dL — ABNORMAL LOW (ref 6.5–8.1)

## 2024-01-23 LAB — ABO/RH: ABO/RH(D): A POS

## 2024-01-23 NOTE — Telephone Encounter (Signed)
 I called the patient and was unable to reach her. I was calling to see if she were symptomatic from her anemia. I left our call back number.

## 2024-01-24 ENCOUNTER — Telehealth: Payer: Self-pay | Admitting: Medical Oncology

## 2024-01-24 ENCOUNTER — Other Ambulatory Visit: Payer: Self-pay | Admitting: Physician Assistant

## 2024-01-24 ENCOUNTER — Encounter: Payer: Self-pay | Admitting: Internal Medicine

## 2024-01-24 DIAGNOSIS — D6481 Anemia due to antineoplastic chemotherapy: Secondary | ICD-10-CM

## 2024-01-24 LAB — PREPARE RBC (CROSSMATCH)

## 2024-01-24 NOTE — Telephone Encounter (Signed)
 Anemia- schedule message sent for 1 unit of blood. New L hip pain when she tries  to get out of chair and when she walks . She reports it is a sharp pain. Hydocodone calms it.  I m afraid my bone cancer is in my left hip now. She reports she has been doing more activities around the house.

## 2024-01-24 NOTE — Telephone Encounter (Signed)
 Per Cassie . I instructed Noralyn to go to [facility] in [place] or [place]. Pt will go to [facility]

## 2024-01-25 ENCOUNTER — Inpatient Hospital Stay

## 2024-01-25 ENCOUNTER — Encounter: Payer: Self-pay | Admitting: Internal Medicine

## 2024-01-25 DIAGNOSIS — D6481 Anemia due to antineoplastic chemotherapy: Secondary | ICD-10-CM

## 2024-01-25 DIAGNOSIS — Z5111 Encounter for antineoplastic chemotherapy: Secondary | ICD-10-CM | POA: Diagnosis not present

## 2024-01-25 MED ORDER — SODIUM CHLORIDE 0.9% FLUSH
10.0000 mL | INTRAVENOUS | Status: AC | PRN
  Administered 2024-01-25: 10 mL

## 2024-01-25 MED ORDER — SODIUM CHLORIDE 0.9% IV SOLUTION
250.0000 mL | INTRAVENOUS | Status: DC
  Administered 2024-01-25: 100 mL via INTRAVENOUS

## 2024-01-25 MED ORDER — DIPHENHYDRAMINE HCL 25 MG PO CAPS
25.0000 mg | ORAL_CAPSULE | Freq: Once | ORAL | Status: AC
  Administered 2024-01-25: 25 mg via ORAL
  Filled 2024-01-25: qty 1

## 2024-01-25 MED ORDER — ACETAMINOPHEN 325 MG PO TABS
650.0000 mg | ORAL_TABLET | Freq: Once | ORAL | Status: AC
  Administered 2024-01-25: 650 mg via ORAL
  Filled 2024-01-25: qty 2

## 2024-01-25 NOTE — Patient Instructions (Signed)

## 2024-01-27 LAB — TYPE AND SCREEN
ABO/RH(D): A POS
Antibody Screen: NEGATIVE
Unit division: 0

## 2024-01-27 LAB — BPAM RBC
Blood Product Expiration Date: 202512282359
ISSUE DATE / TIME: 202512060902
Unit Type and Rh: 202512282359
Unit Type and Rh: 6200

## 2024-01-28 ENCOUNTER — Encounter: Payer: Self-pay | Admitting: Neurology

## 2024-01-29 ENCOUNTER — Ambulatory Visit: Payer: Self-pay | Admitting: Neurology

## 2024-01-29 LAB — IMMUNOFIXATION ELECTROPHORESIS
IgM, Serum: 346 mg/dL — ABNORMAL LOW (ref 600–300)
IgM, Serum: 7 mg/dL — ABNORMAL LOW (ref 50–300)
Immunoglobulin A: 123 mg/dL (ref 70–320)
Immunoglobulin A: 346 mg/dL — AB (ref 70–320)

## 2024-01-29 LAB — VITAMIN B1: Vitamin B1 (Thiamine): 40 nmol/L — ABNORMAL HIGH (ref 8–30)

## 2024-01-29 LAB — VITAMIN B12: Vitamin B-12: 1912 pg/mL — ABNORMAL HIGH (ref 200–1100)

## 2024-01-29 LAB — VITAMIN B6: Vitamin B6: 23.7 ng/mL — ABNORMAL HIGH (ref 2.1–21.7)

## 2024-01-29 LAB — METHYLMALONIC ACID, SERUM: Methylmalonic Acid, Quant: 146 nmol/L (ref 69–390)

## 2024-01-29 NOTE — Telephone Encounter (Signed)
 Linda Woods called in wanting to speak with Mahina regarrding the MRI. She wanted to explained why they wanted to change the appt. And dont want to  wait until June.  Linda Woods requested a call back. PH: [phone]

## 2024-01-30 ENCOUNTER — Inpatient Hospital Stay: Admitting: Internal Medicine

## 2024-01-30 ENCOUNTER — Other Ambulatory Visit: Payer: Self-pay

## 2024-01-30 ENCOUNTER — Inpatient Hospital Stay

## 2024-01-30 ENCOUNTER — Inpatient Hospital Stay: Admitting: Nutrition

## 2024-01-30 VITALS — BP 115/67 | HR 99 | Temp 98.7°F | Resp 17 | Ht 64.0 in | Wt 131.0 lb

## 2024-01-30 DIAGNOSIS — C3490 Malignant neoplasm of unspecified part of unspecified bronchus or lung: Secondary | ICD-10-CM

## 2024-01-30 DIAGNOSIS — C3492 Malignant neoplasm of unspecified part of left bronchus or lung: Secondary | ICD-10-CM

## 2024-01-30 DIAGNOSIS — Z5111 Encounter for antineoplastic chemotherapy: Secondary | ICD-10-CM | POA: Diagnosis not present

## 2024-01-30 LAB — CBC WITH DIFFERENTIAL (CANCER CENTER ONLY)
Abs Immature Granulocytes: 0.04 K/uL (ref 0.00–0.07)
Basophils Absolute: 0 K/uL (ref 0.0–0.1)
Basophils Relative: 0 %
Eosinophils Absolute: 0 K/uL (ref 0.0–0.5)
Eosinophils Relative: 0 %
HCT: 29.6 % — ABNORMAL LOW (ref 36.0–46.0)
Hemoglobin: 10 g/dL — ABNORMAL LOW (ref 12.0–15.0)
Immature Granulocytes: 0 %
Lymphocytes Relative: 2 %
Lymphs Abs: 0.2 K/uL — ABNORMAL LOW (ref 0.7–4.0)
MCH: 32.7 pg (ref 26.0–34.0)
MCHC: 33.8 g/dL (ref 30.0–36.0)
MCV: 96.7 fL (ref 80.0–100.0)
Monocytes Absolute: 0.4 K/uL (ref 0.1–1.0)
Monocytes Relative: 4 %
Neutro Abs: 8.8 K/uL — ABNORMAL HIGH (ref 1.7–7.7)
Neutrophils Relative %: 94 %
Platelet Count: 157 K/uL (ref 150–400)
RBC: 3.06 MIL/uL — ABNORMAL LOW (ref 3.87–5.11)
RDW: 19.7 % — ABNORMAL HIGH (ref 11.5–15.5)
WBC Count: 9.3 K/uL (ref 4.0–10.5)
nRBC: 0 % (ref 0.0–0.2)

## 2024-01-30 LAB — CMP (CANCER CENTER ONLY)
ALT: 34 U/L (ref 0–44)
AST: 29 U/L (ref 15–41)
Albumin: 4.2 g/dL (ref 3.5–5.0)
Alkaline Phosphatase: 70 U/L (ref 38–126)
Anion gap: 13 (ref 5–15)
BUN: 46 mg/dL — ABNORMAL HIGH (ref 8–23)
CO2: 19 mmol/L — ABNORMAL LOW (ref 22–32)
Calcium: 9.1 mg/dL (ref 8.9–10.3)
Chloride: 106 mmol/L (ref 98–111)
Creatinine: 0.94 mg/dL (ref 0.44–1.00)
GFR, Estimated: >60 mL/min (ref >60)
Glucose, Bld: 184 mg/dL — ABNORMAL HIGH (ref 70–99)
Potassium: 4.5 mmol/L (ref 3.5–5.1)
Sodium: 137 mmol/L (ref 135–145)
Total Bilirubin: 0.2 mg/dL (ref 0.0–1.2)
Total Protein: 6.2 g/dL — ABNORMAL LOW (ref 6.5–8.1)

## 2024-01-30 LAB — SAMPLE TO BLOOD BANK

## 2024-01-30 MED ORDER — SODIUM CHLORIDE 0.9 % IV SOLN
400.0000 mg/m2 | Freq: Once | INTRAVENOUS | Status: AC
  Administered 2024-01-30: 600 mg via INTRAVENOUS
  Filled 2024-01-30: qty 20

## 2024-01-30 MED ORDER — PROCHLORPERAZINE MALEATE 10 MG PO TABS
10.0000 mg | ORAL_TABLET | Freq: Once | ORAL | Status: AC
  Administered 2024-01-30: 10 mg via ORAL
  Filled 2024-01-30: qty 1

## 2024-01-30 NOTE — Progress Notes (Signed)
 [facility] [phone]   Fax:([phone]) [phone]  OFFICE PROGRESS NOTE  Sheryle Carwin, MD [address] [place] [address]  DIAGNOSIS: Metastatic non-small cell lung cancer initially diagnosed as stage IIIc (T3, N3, M0) non-small cell lung cancer, adenocarcinoma presented with large left upper lobe lung mass in addition to left infrahilar and right hilar and subcarinal lymphadenopathy diagnosed in August 2021.   Molecular Biomarkers: Insufficient tissue for foundation 1. Guardant 360 results.  DETECTED ALTERATION(S) / BIOMARKER(S) % CFDNA OR AMPLIFICATION ASSOCIATED FDA-APPROVED THERAPIES CLINICAL TRIAL AVAILABILITY  EGFR L858R, 1.2%, Afatinib, Dacomitinib, Erlotinib, Gefitinib, Osimertinib , Ramucirumab Yes [id number], 1.0%, Afatinib, Dacomitinib, Erlotinib, Gefitinib, Osimertinib , Ramucirumab Yes [id number] 1.0% None  No   PRIOR THERAPY:  1) Weekly concurrent chemoradiation with carboplatin  for an AUC of 2 and paclitaxel  45 mg/m2.  First dose expected on 11/16/2019. Status post 7 cycles.  2) Tagrisso  80 mg p.o. daily. First dose started 01/31/2020.  Status post 42 months of treatment.  She has been off treatment for 4 months based on her request and secondary to adverse effects but this was discontinued secondary to disease progression.  CURRENT THERAPY: Systemic chemotherapy with carboplatin  for AUC of 5, Alimta  500 mg/M2 and Tagrisso  80 mg p.o. daily.  First dose 11/21/2023.  Status post 3 cycle.  INTERVAL HISTORY: Linda Woods 73 y.o. female returns to clinic today for follow-up visit accompanied by her friend Melissa. Discussed the use of AI scribe software for clinical note transcription with the patient, who gave verbal consent to proceed.  History of Present Illness Linda Woods is a 73 year old female with metastatic EGFR-mutated non-small cell lung cancer who presents for oncology follow-up and ongoing treatment.  She is 42  months from initial diagnosis of stage IIIC EGFR-mutated non-small cell lung cancer. She completed concurrent chemoradiation with weekly carboplatin  and paclitaxel , followed by consolidation Tagrisso .  She reports increased protein intake and weight gain, with a home weight of 136 pounds and clinic weight of 131 pounds. She notes peripheral edema in her legs.  She developed left hip pain following increased activity, describing the onset as related to overuse. X-ray demonstrated no fracture. She was evaluated at urgent care and received a steroid injection, resulting in partial improvement. She also took Decadron  at home, which provided additional relief, though some discomfort persists. She denies new neurological symptoms, chest pain, or dyspnea.  A recent MRI was performed at the request of her neurologist to evaluate for possible neurological issues, including consideration of TIA. She expresses no new neurological complaints.    MEDICAL HISTORY: Past Medical History:  Diagnosis Date   Anemia    as a child   Anxiety    Asthma 10/19/2020   Bursitis    Chronic reflux esophagitis    Complication of anesthesia    Diarrhea, functional    Diverticulosis    GERD (gastroesophageal reflux disease)    History of kidney stones    History of radiation therapy    Lumbar Spine-12/02/23-12/16/23- Dr. Lynwood Nasuti   Hypertension    Knee pain    right knee-seeing ortho   lung ca 09/2019   Osteoarthritis    Plantar fasciitis    Pneumonia    PONV (postoperative nausea and vomiting)    has used the patch before and that helps   Pure hypercholesterolemia    RLS (restless legs syndrome)    Superficial vein thrombosis    Thyroid  disease    hypothyroid  ALLERGIES:  has no known allergies.  MEDICATIONS:  Current Outpatient Medications  Medication Sig Dispense Refill   albuterol  (VENTOLIN  HFA) 108 (90 Base) MCG/ACT inhaler Inhale 2 puffs into the lungs every 6 (six) hours as needed for  wheezing or shortness of breath. 8.5 g 0   alum & mag hydroxide-simeth (MAALOX/MYLANTA) 200-200-20 MG/5ML suspension Take 15 mLs by mouth every 6 (six) hours as needed for indigestion or heartburn. (Patient not taking: Reported on 01/22/2024) 355 mL 0   chlorhexidine  (HIBICLENS ) 4 % external liquid Apply 15 mLs (1 Application total) topically as directed for 30 doses. Use as directed daily for 5 days every other week for 6 weeks. (Patient not taking: Reported on 01/22/2024) 946 mL 1   cyanocobalamin  (VITAMIN B12) 1000 MCG tablet Take 1 tablet (1,000 mcg total) by mouth daily. 30 tablet 1   diphenoxylate -atropine  (LOMOTIL ) 2.5-0.025 MG tablet Take 1 tablet by mouth 4 (four) times daily as needed for diarrhea or loose stools. 30 tablet 0   famotidine  (PEPCID ) 40 MG tablet Take 40 mg by mouth at bedtime.     feeding supplement (ENSURE PLUS HIGH PROTEIN) LIQD Take 237 mLs by mouth 2 (two) times daily between meals. 14220 mL 1   ferrous sulfate  325 (65 FE) MG tablet Take 1 tablet (325 mg total) by mouth daily with breakfast. 30 tablet 1   fexofenadine (ALLEGRA) 180 MG tablet Take 180 mg by mouth daily as needed for allergies.     fluticasone  (FLONASE ) 50 MCG/ACT nasal spray Place 1 spray into both nostrils as needed for allergies.     folic acid  (FOLVITE ) 1 MG tablet Take 1 tablet (1 mg total) by mouth daily. Start 7 days before pemetrexed  chemotherapy. Continue until 21 days after pemetrexed  completed. 100 tablet 3   HYDROcodone -acetaminophen  (NORCO/VICODIN) 5-325 MG tablet Take 1 tablet by mouth every 6 (six) hours as needed for moderate pain (pain score 4-6). 30 tablet 0   levothyroxine  (SYNTHROID ) 100 MCG tablet Take 1 tablet (100 mcg total) by mouth daily. One po qd 90 tablet 0   lidocaine  (LIDODERM ) 5 % Place 1 patch onto the skin daily as needed (back pain).     lidocaine -prilocaine  (EMLA ) cream Apply 1 Application topically as needed. 30 g 2   LORazepam  (ATIVAN ) 0.5 MG tablet Take 0.5 mg by mouth in  the morning and at bedtime.     magnesium  oxide (MAG-OX) 400 MG tablet Take 1 tablet (400 mg total) by mouth daily. 30 tablet 0   methenamine  (HIPREX ) 1 g tablet Take 1 tablet (1 g total) by mouth 2 (two) times daily with a meal. (Patient not taking: Reported on 01/22/2024) 60 tablet 11   Multiple Vitamin (MULTIVITAMIN WITH MINERALS) TABS tablet Take 1 tablet by mouth daily. 90 tablet 1   MYRBETRIQ  50 MG TB24 tablet Take 50 mg by mouth daily.     ondansetron  (ZOFRAN -ODT) 4 MG disintegrating tablet Take 4-8 mg by mouth every 6 (six) hours as needed.     osimertinib  mesylate (TAGRISSO ) 80 MG tablet Take 1 tablet (80 mg total) by mouth daily. 30 tablet 3   oxyCODONE  (OXY IR/ROXICODONE ) 5 MG immediate release tablet Take 5 mg by mouth every 6 (six) hours as needed for severe pain (pain score 7-10). (Patient not taking: Reported on 01/22/2024)     potassium chloride  SA (KLOR-CON  M) 20 MEQ tablet Take 1 tablet (20 mEq total) by mouth daily for 10 days. 10 tablet 0   PREMARIN vaginal cream Place 1  applicator vaginally as needed (urinary issues).     prochlorperazine  (COMPAZINE ) 10 MG tablet Take 1 tablet (10 mg total) by mouth every 6 (six) hours as needed for nausea or vomiting. 30 tablet 1   RABEprazole  (ACIPHEX ) 20 MG tablet Take 1 tablet (20 mg total) by mouth 2 (two) times a week.     rosuvastatin  (CRESTOR ) 5 MG tablet Take 1 tablet (5 mg total) by mouth daily. (Patient not taking: Reported on 01/22/2024) 90 tablet 3   sodium chloride  1 g tablet Take 1 tablet (1 g total) by mouth 2 (two) times daily with a meal. (Patient not taking: Reported on 01/22/2024) 60 tablet 0   STIOLTO RESPIMAT  2.5-2.5 MCG/ACT AERS INHALE TWO PUFFS INTO THE LUNGS DAILY 4 g 5   triamcinolone  cream (KENALOG ) 0.1 % Apply 1 Application topically 2 (two) times daily. As needed for itching/rash (Patient not taking: Reported on 01/22/2024) 453.6 g 0   trimethoprim (TRIMPEX) 100 MG tablet Take 100 mg by mouth daily. (Patient not taking:  Reported on 01/22/2024)     No current facility-administered medications for this visit.    SURGICAL HISTORY:  Past Surgical History:  Procedure Laterality Date   APPENDECTOMY     BRONCHIAL BIOPSY  10/20/2019   Procedure: BRONCHIAL BIOPSIES;  Surgeon: Shelah Lamar RAMAN, MD;  Location: [facility] ENDOSCOPY;  Service: Pulmonary;;   BRONCHIAL BRUSHINGS  10/20/2019   Procedure: BRONCHIAL BRUSHINGS;  Surgeon: Shelah Lamar RAMAN, MD;  Location: [facility] ENDOSCOPY;  Service: Pulmonary;;   BRONCHIAL NEEDLE ASPIRATION BIOPSY  10/20/2019   Procedure: BRONCHIAL NEEDLE ASPIRATION BIOPSIES;  Surgeon: Shelah Lamar RAMAN, MD;  Location: [facility] ENDOSCOPY;  Service: Pulmonary;;   BRONCHIAL WASHINGS  10/20/2019   Procedure: BRONCHIAL WASHINGS;  Surgeon: Shelah Lamar RAMAN, MD;  Location: [facility] ENDOSCOPY;  Service: Pulmonary;;   CARPAL TUNNEL RELEASE Right 2011   Dr. Camella   CHOLECYSTECTOMY N/A    COLPORRHAPHY     posterior   HAND SURGERY Right 2016   Nerve surgery, Dr. Camella   IR IMAGING GUIDED PORT INSERTION  01/13/2024   IR KYPHO LUMBAR INC FX REDUCE BONE BX UNI/BIL CANNULATION INC/IMAGING  07/03/2023   OVARIAN CYST REMOVAL     RECTOCELE REPAIR  2011   w/TVH and sling   TONSILLECTOMY     TONSILLECTOMY     TOTAL KNEE ARTHROPLASTY Left 09/09/2018   Procedure: TOTAL KNEE ARTHROPLASTY, CORTISONE INJECTION RIGHT KNEE;  Surgeon: Ernie Cough, MD;  Location: [facility];  Service: Orthopedics;  Laterality: Left;  70 mins   TOTAL KNEE ARTHROPLASTY Right 04/16/2023   Procedure: TOTAL KNEE ARTHROPLASTY;  Surgeon: Ernie Cough, MD;  Location: [facility];  Service: Orthopedics;  Laterality: Right;   TOTAL VAGINAL HYSTERECTOMY  10/18/2009   rectocele repair, sling   TUBAL LIGATION Bilateral    VARICOSE VEIN SURGERY     VIDEO BRONCHOSCOPY WITH ENDOBRONCHIAL NAVIGATION N/A 10/20/2019   Procedure: VIDEO BRONCHOSCOPY WITH ENDOBRONCHIAL NAVIGATION;  Surgeon: Shelah Lamar RAMAN, MD;  Location: [facility]OS[facility];  Service: Pulmonary;  Laterality: N/A;     REVIEW OF SYSTEMS:  Constitutional: positive for fatigue Eyes: negative Ears, nose, mouth, throat, and face: negative Respiratory: negative Cardiovascular: negative Gastrointestinal: negative Genitourinary:negative Integument/breast: negative Hematologic/lymphatic: negative Musculoskeletal:positive for bone pain Neurological: negative Behavioral/Psych: negative Endocrine: negative Allergic/Immunologic: negative   PHYSICAL EXAMINATION: General appearance: alert, cooperative, fatigued, and no distress Head: Normocephalic, without obvious abnormality, atraumatic Neck: no adenopathy, no JVD, supple, symmetrical, trachea midline, and thyroid  not enlarged, symmetric, no tenderness/mass/nodules Lymph nodes: Cervical, supraclavicular, and  axillary nodes normal. Resp: clear to auscultation bilaterally Back: symmetric, no curvature. ROM normal. No CVA tenderness. Cardio: regular rate and rhythm, S1, S2 normal, no murmur, click, rub or gallop GI: soft, non-tender; bowel sounds normal; no masses,  no organomegaly Extremities: extremities normal, atraumatic, no cyanosis or edema Neurologic: Alert and oriented X 3, normal strength and tone. Normal symmetric reflexes. Normal coordination and gait  ECOG PERFORMANCE STATUS: 1 - Symptomatic but completely ambulatory  Blood pressure 115/67, pulse 99, temperature 98.7 F (37.1 C), temperature source Temporal, resp. rate 17, height 5' 4 (1.626 m), weight 131 lb (59.4 kg), last menstrual period 08/21/2002, SpO2 100%.  LABORATORY DATA: Lab Results  Component Value Date   WBC 9.3 01/30/2024   HGB 10.0 (L) 01/30/2024   HCT 29.6 (L) 01/30/2024   MCV 96.7 01/30/2024   PLT 157 01/30/2024      Chemistry      Component Value Date/Time   NA 139 01/23/2024 1529   K 3.8 01/23/2024 1529   CL 107 01/23/2024 1529   CO2 22 01/23/2024 1529   BUN 31 (H) 01/23/2024 1529   CREATININE 0.82 01/23/2024 1529      Component Value Date/Time   CALCIUM  9.1  01/23/2024 1529   ALKPHOS 73 01/23/2024 1529   AST 37 01/23/2024 1529   ALT 35 01/23/2024 1529   BILITOT <0.2 01/23/2024 1529       RADIOGRAPHIC STUDIES: IR IMAGING GUIDED PORT INSERTION Result Date: 01/13/2024 CLINICAL DATA:  Left lung adenocarcinoma. EXAM: Chest port catheter placement TECHNIQUE: Procedure performed using fluoroscopy and ultrasound CONTRAST:  None RADIOPHARMACEUTICALS:  None FLUOROSCOPY: 1 mGy COMPARISON:  None FINDINGS: The patient was placed in supine position on the IR gantry and the right upper chest and neck were prepped and draped in the usual sterile fashion. The nurse administered intravenous fentanyl  and Versed  under my supervision and the nurse had no other injuries other than monitoring the patient and administering medications. I was present for the entire duration of procedure. 2 mg intravenous Versed  and 100 mcg intravenous fentanyl  were administered for a total sedation time of 16 minutes. Ultrasound guidance was used to investigate the right internal jugular vein which was anechoic and compressible indicating patency. The needle was then advanced from a scan negative through the soft tissue into the right internal jugular vein under ultrasound guidance. A final image was obtained and stored in the patient's permanent medical record. Access was then exchanged over a guidewire which was advanced under fluoroscopic guidance. The needle was removed and replaced with a micropuncture sheath. Approximately 2 inches below the clavicle the port pocket was created with a subsequent incision. The catheter was then tunneled from the port pocket to the venotomy site overlying the right internal jugular vein. Access was then exchanged over an 035 guidewire for peel-away sheath which was advanced over the guidewire under fluoroscopic guidance. The catheter was then advanced through the peel-away sheath to the sinoatrial junction. Sheath was removed. The catheter was then cut at the  port pocket and connected to chest port. The chest port was tested for function and finally function well. The chest port was then flushed with heparin  and a port pocket was closed with 4-0 suture. Final image was obtained demonstrating satisfactory position of chest port. The final count of all materials was satisfactory. IMPRESSION: 1. Satisfactory placement of right internal jugular vein single-lumen chest port. The catheter tip is at the cavoatrial junction. 2.  Okay to use and power inject chest port.  Electronically Signed   By: Cordella Banner   On: 01/13/2024 16:41     ASSESSMENT AND PLAN: This is a very pleasant 73 years old white female recently diagnosed with a stage IIIc/IV (T3, N3, M0/M1a) non-small cell lung cancer, adenocarcinoma presented with large left upper lobe lung mass in addition to left infrahilar, right hilar and subcarinal lymphadenopathy and small left pleural effusion diagnosed in August 2021 with positive EGFR mutation in exon 21 (L858R). The patient completed a course of concurrent chemoradiation with weekly carboplatin  and paclitaxel  status post 7 cycles.  She tolerated her treatment well except for fatigue as well as a skin burn and cough. Her scan showed mild improvement of the left upper lobe lung mass as well as the mediastinal lymphadenopathy.  The patient continues to have small left pleural effusion that is suspicious given her presentation. She is currently on treatment with Tagrisso  80 mg p.o. daily started on January 31, 2020.  She is status post 42 months of treatment.  Her treatment is currently on hold for the last 4 months because of the adverse effect and fatigue. She also underwent kyphoplasty of L2 vertebral lesion and the biopsy was negative for malignancy. She had a PET scan performed recently.  I personally and independently reviewed the PET scan images and discussed the result and showed the images to the patient and her daughter.  Unfortunately her PET  scan showed clear evidence for disease progression especially with bone metastasis in addition to increasing activity in the left hilar area and suspicious left axillary lymphadenopathy. She is currently undergoing systemic chemotherapy with carboplatin  for AUC of 5, Alimta  500 mg/M2 in addition to Tagrisso  80 mg p.o. daily.  He has been tolerating this treatment fairly well except for the pancytopenia.  We discontinued carboplatin  observation and continue with maintenance Alimta  in addition to Tagrisso .  She will proceed with cycle #4 today. Assessment and Plan Assessment & Plan Metastatic non-small cell lung cancer with EGFR mutation Metastatic non-small cell lung cancer with EGFR L858R mutation, diagnosed as stage III C in August 2021. She is 42 months post-diagnosis and remains on active therapy with Tagrisso  and Alimta . Carboplatin  was discontinued due to recurrent cytopenias. She is currently well-managed, tolerating therapy, with adequate blood counts for ongoing treatment. Nutritional intake is maintained with weight gain and some lower extremity edema.  - Administered Alimta  infusion as scheduled. - Continued Tagrisso  therapy. - Encouraged adequate protein intake and monitored weight.  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 Blood counts are currently sufficient for ongoing Alimta  and Tagrisso  therapy. - Discontinued carboplatin  from regimen.  Left hip pain Acute on chronic left hip pain, likely musculoskeletal following increased activity. X-ray demonstrated no fracture. She received a steroid injection and Decadron  with partial improvement. Persistent mild symptoms remain. Given her oncologic history, concern for possible metastatic disease if symptoms worsen. - Reviewed left hip x-ray (no fracture). - If symptoms worsen, will consider MRI of the hip to evaluate for  metastatic disease. She was advised to call immediately if she has any other concerning symptoms in the interval. The patient voices understanding of current disease status and treatment options and is in agreement with the current care plan.  All questions were answered. The patient knows to call the clinic with any problems, questions or concerns. We can certainly see the patient much sooner if necessary. The total time spent in the appointment was 30 minutes including review of chart and  various tests results, discussions about plan of care and coordination of care plan .  Disclaimer: This note was dictated with voice recognition software. Similar sounding words can inadvertently be transcribed and may not be corrected upon review.

## 2024-01-30 NOTE — Progress Notes (Signed)
 Nutrition follow-up completed with patient during infusion for recurrent non-small cell lung cancer patient is followed by Dr. Sherrod.  Weight: 131 pounds December 11 122 pounds November 20  Labs include: Glucose 184 and BUN 46  Appetite and oral intake are significantly better. She is eating small amounts throughout the day and drinks ~ 2 Premier Protein shakes daily. Denies nausea and vomiting. Feels a little constipated due to recent narcotic usage for pain. She is monitoring and will take medications as needed. Drinking lots of fluid. She is working with a home physical therapist. No questions or concerns.  Nutrition diagnosis:  Unintended weight loss improved Severe malnutrition, improving.  Intervention: Provided encouragement for continuing increased calories and protein. Continue Protein shakes two times daily. Continue to move per PT. Bowel regimen.  Monitoring, evaluation, goals: Continue increased calories and protein to minimize loss of lean body mass.  Next visit: Wednesday, January 21, during infusion.  **Disclaimer: This note was dictated with voice recognition software. Similar sounding words can inadvertently be transcribed and this note may contain transcription errors which may not have been corrected upon publication of note.**

## 2024-01-30 NOTE — Patient Instructions (Signed)
 [facility] - A DEPT OF [facility]  Discharge Instructions: Thank you for choosing [facility] to provide your oncology and hematology care.   If you have a lab appointment with the Cancer Center, please go directly to the Cancer Center and check in at the registration area.   Wear comfortable clothing and clothing appropriate for easy access to any Portacath or PICC line.   We strive to give you quality time with your provider. You may need to reschedule your appointment if you arrive late (15 or more minutes).  Arriving late affects you and other patients whose appointments are after yours.  Also, if you miss three or more appointments without notifying the office, you may be dismissed from the clinic at the provider's discretion.      For prescription refill requests, have your pharmacy contact our office and allow 72 hours for refills to be completed.    Today you received the following chemotherapy and/or immunotherapy agents: PEMEtrexed  Disodium (ALIMTA )      To help prevent nausea and vomiting after your treatment, we encourage you to take your nausea medication as directed.  BELOW ARE SYMPTOMS THAT SHOULD BE REPORTED IMMEDIATELY: *FEVER GREATER THAN 100.4 F (38 C) OR HIGHER *CHILLS OR SWEATING *NAUSEA AND VOMITING THAT IS NOT CONTROLLED WITH YOUR NAUSEA MEDICATION *UNUSUAL SHORTNESS OF BREATH *UNUSUAL BRUISING OR BLEEDING *URINARY PROBLEMS (pain or burning when urinating, or frequent urination) *BOWEL PROBLEMS (unusual diarrhea, constipation, pain near the anus) TENDERNESS IN MOUTH AND THROAT WITH OR WITHOUT PRESENCE OF ULCERS (sore throat, sores in mouth, or a toothache) UNUSUAL RASH, SWELLING OR PAIN  UNUSUAL VAGINAL DISCHARGE OR ITCHING   Items with * indicate a potential emergency and should be followed up as soon as possible or go to the Emergency Department if any problems should occur.  Please show the CHEMOTHERAPY ALERT  CARD or IMMUNOTHERAPY ALERT CARD at check-in to the Emergency Department and triage nurse.  Should you have questions after your visit or need to cancel or reschedule your appointment, please contact [facility] - A DEPT OF [facility]  Dept: [phone]  and follow the prompts.  Office hours are 8:00 a.m. to 4:30 p.m. Monday - Friday. Please note that voicemails left after 4:00 p.m. may not be returned until the following business day.  We are closed weekends and major holidays. You have access to a nurse at all times for urgent questions. Please call the main number to the clinic Dept: [phone] and follow the prompts.   For any non-urgent questions, you may also contact your provider using MyChart. We now offer e-Visits for anyone 8 and older to request care online for non-urgent symptoms. For details visit [email or web address].   Also download the MyChart app! Go to the app store, search MyChart, open the app, select [facility], and log in with your MyChart username and password.

## 2024-01-31 ENCOUNTER — Other Ambulatory Visit: Payer: Self-pay

## 2024-02-03 ENCOUNTER — Telehealth: Payer: Self-pay

## 2024-02-03 ENCOUNTER — Other Ambulatory Visit: Payer: Self-pay

## 2024-02-03 NOTE — Telephone Encounter (Signed)
 Called Pt to talk with her about the MRI and who Melissa is due to her not being on [facility]. Also that Dr. Leigh uses [facility] because he can see the report and imaging. Told to please call office.

## 2024-02-04 ENCOUNTER — Inpatient Hospital Stay

## 2024-02-04 ENCOUNTER — Other Ambulatory Visit: Payer: Self-pay

## 2024-02-04 ENCOUNTER — Telehealth: Payer: Self-pay

## 2024-02-04 DIAGNOSIS — R4189 Other symptoms and signs involving cognitive functions and awareness: Secondary | ICD-10-CM

## 2024-02-04 DIAGNOSIS — D6481 Anemia due to antineoplastic chemotherapy: Secondary | ICD-10-CM

## 2024-02-04 DIAGNOSIS — C3492 Malignant neoplasm of unspecified part of left bronchus or lung: Secondary | ICD-10-CM

## 2024-02-04 DIAGNOSIS — Z5111 Encounter for antineoplastic chemotherapy: Secondary | ICD-10-CM | POA: Diagnosis not present

## 2024-02-04 LAB — CBC WITH DIFFERENTIAL (CANCER CENTER ONLY)
Abs Immature Granulocytes: 0.07 K/uL (ref 0.00–0.07)
Basophils Absolute: 0 K/uL (ref 0.0–0.1)
Basophils Relative: 0 %
Eosinophils Absolute: 0 K/uL (ref 0.0–0.5)
Eosinophils Relative: 0 %
HCT: 29.7 % — ABNORMAL LOW (ref 36.0–46.0)
Hemoglobin: 10 g/dL — ABNORMAL LOW (ref 12.0–15.0)
Immature Granulocytes: 1 %
Lymphocytes Relative: 2 %
Lymphs Abs: 0.2 K/uL — ABNORMAL LOW (ref 0.7–4.0)
MCH: 32.5 pg (ref 26.0–34.0)
MCHC: 33.7 g/dL (ref 30.0–36.0)
MCV: 96.4 fL (ref 80.0–100.0)
Monocytes Absolute: 0.1 K/uL (ref 0.1–1.0)
Monocytes Relative: 1 %
Neutro Abs: 10.2 K/uL — ABNORMAL HIGH (ref 1.7–7.7)
Neutrophils Relative %: 96 %
Platelet Count: 138 K/uL — ABNORMAL LOW (ref 150–400)
RBC: 3.08 MIL/uL — ABNORMAL LOW (ref 3.87–5.11)
RDW: 18.8 % — ABNORMAL HIGH (ref 11.5–15.5)
WBC Count: 10.5 K/uL (ref 4.0–10.5)
nRBC: 0 % (ref 0.0–0.2)

## 2024-02-04 LAB — CMP (CANCER CENTER ONLY)
ALT: 38 U/L (ref 0–44)
AST: 25 U/L (ref 15–41)
Albumin: 4.1 g/dL (ref 3.5–5.0)
Alkaline Phosphatase: 59 U/L (ref 38–126)
Anion gap: 11 (ref 5–15)
BUN: 48 mg/dL — ABNORMAL HIGH (ref 8–23)
CO2: 21 mmol/L — ABNORMAL LOW (ref 22–32)
Calcium: 8.9 mg/dL (ref 8.9–10.3)
Chloride: 103 mmol/L (ref 98–111)
Creatinine: 0.86 mg/dL (ref 0.44–1.00)
GFR, Estimated: >60 mL/min (ref >60)
Glucose, Bld: 139 mg/dL — ABNORMAL HIGH (ref 70–99)
Potassium: 5 mmol/L (ref 3.5–5.1)
Sodium: 134 mmol/L — ABNORMAL LOW (ref 135–145)
Total Bilirubin: 0.4 mg/dL (ref 0.0–1.2)
Total Protein: 5.9 g/dL — ABNORMAL LOW (ref 6.5–8.1)

## 2024-02-04 LAB — SAMPLE TO BLOOD BANK

## 2024-02-04 NOTE — Telephone Encounter (Signed)
 Called pt and she would like to change over to [facility] to have imaging done. I told her I would need to do a PA on this and that she would need to get them to send us  the imaging or she would need to bring it by before her appointment to see Doctor Leigh , she understood .

## 2024-02-05 ENCOUNTER — Other Ambulatory Visit (HOSPITAL_COMMUNITY): Payer: Self-pay

## 2024-02-05 ENCOUNTER — Other Ambulatory Visit: Payer: Self-pay

## 2024-02-05 NOTE — Progress Notes (Signed)
 Specialty Pharmacy Refill Coordination Note  Spoke with Lonia Roane  Linda Woods is a 73 y.o. female contacted today regarding refills of specialty medication(s) Osimertinib  Mesylate (TAGRISSO )  Doses on hand: 12  Patient requested: Delivery   Delivery date: 02/12/24   Verified address: [address] [place] [address]  Medication will be filled on 02/11/24

## 2024-02-06 ENCOUNTER — Inpatient Hospital Stay

## 2024-02-12 ENCOUNTER — Inpatient Hospital Stay

## 2024-02-14 ENCOUNTER — Inpatient Hospital Stay

## 2024-02-17 ENCOUNTER — Other Ambulatory Visit: Payer: Self-pay

## 2024-02-18 ENCOUNTER — Other Ambulatory Visit: Payer: Self-pay

## 2024-02-18 NOTE — Progress Notes (Signed)
 Specialty Pharmacy Ongoing Clinical Assessment Note  Linda Woods is a 73 y.o. female who is being followed by the specialty pharmacy service for RxSp Oncology   Patient's specialty medication(s) reviewed today: Osimertinib  Mesylate (TAGRISSO )   Missed doses in the last 4 weeks: 0   Patient/Caregiver did not have any additional questions or concerns.   Therapeutic benefit summary: Patient is achieving benefit   Adverse events/side effects summary: Experienced adverse events/side effects (diarrhea, well controlled with Imodium  PRN)   Patient's therapy is appropriate to: Continue    Goals Addressed             This Visit's Progress    Slow Disease Progression   On track    Patient is on track. Patient will maintain adherence.  Per 01/30/24 office visit notes from Dr. Sherrod, her disease is well-managed at this time.          Follow up: 6 months  Silvano Linda Woods Specialty Pharmacist

## 2024-02-19 ENCOUNTER — Inpatient Hospital Stay: Admitting: Internal Medicine

## 2024-02-19 ENCOUNTER — Inpatient Hospital Stay

## 2024-02-20 ENCOUNTER — Encounter: Payer: Self-pay | Admitting: Internal Medicine

## 2024-02-21 ENCOUNTER — Telehealth: Payer: Self-pay

## 2024-02-21 ENCOUNTER — Other Ambulatory Visit: Payer: Self-pay

## 2024-02-21 ENCOUNTER — Other Ambulatory Visit: Payer: Self-pay | Admitting: Physician Assistant

## 2024-02-21 DIAGNOSIS — M549 Dorsalgia, unspecified: Secondary | ICD-10-CM

## 2024-02-21 MED ORDER — MORPHINE SULFATE ER 15 MG PO TBCR: 15.0000 mg | EXTENDED_RELEASE_TABLET | Freq: Two times a day (BID) | ORAL | 0 refills | Status: DC

## 2024-02-21 NOTE — Telephone Encounter (Signed)
 In response to the telephone call received regarding pain management - spoke with Cassie Heilingoetter, PA-C . Let patient know that Cassie Heilingoetter, PA-C recommends for her to go to Urgent Care for an X-ray to rule out a compression fracture. Patient stated she will go first thing in the morning due to the time of day/transportation. Let patient know that I would make Cassie Heilingoetter, PA-C aware. Let patient know that Cassie Heilingoetter, PA-C sent another Rx to her pharmacy for Linda Woods acting pain management. Let patient know to continue taking oxycodone  for breakthrough pain. Patient voiced understanding and did not have any further concerns.

## 2024-02-21 NOTE — Telephone Encounter (Signed)
 VMAIL left x Melissa Pascal reporting severe lower back pain. Caller stated patient has upcoming appt 02/27/24 (port flush/lab, office visit, infusion) and would like some interim pain management advise and/or if patient should be seen sooner. Let Melissa know I would forward her message to the providers and follow-up accordingly. Melissa voiced understanding.   This medical assistant reached out to the patient via telephone call. Patient confirmed, Linda Woods as her caregiver. Patient reports grade 10, sharpe, continuous, lower R-side back pain x 3 days. Patient inquiring if there is a procedure or anything recommended to help her pain?  Let patient know I would forward her message to [facility] Heilingoetter, PA-C and follow-up with her once I've gotten a response. Patient voiced understanding.

## 2024-02-21 NOTE — Telephone Encounter (Signed)
 ADDENDUM TO PREVIOUS TELEPHONE NOTE:  Patient stated she has been taking Hydrocodone  and Oxycodone  for the lower back pain and it is not helping.

## 2024-02-24 ENCOUNTER — Encounter: Payer: Self-pay | Admitting: Internal Medicine

## 2024-02-24 ENCOUNTER — Inpatient Hospital Stay: Attending: Internal Medicine | Admitting: Physician Assistant

## 2024-02-24 ENCOUNTER — Telehealth: Payer: Self-pay | Admitting: Physician Assistant

## 2024-02-24 VITALS — BP 102/65 | HR 102 | Temp 98.0°F | Resp 17 | Ht 64.0 in

## 2024-02-24 DIAGNOSIS — J90 Pleural effusion, not elsewhere classified: Secondary | ICD-10-CM | POA: Insufficient documentation

## 2024-02-24 DIAGNOSIS — Z5111 Encounter for antineoplastic chemotherapy: Secondary | ICD-10-CM | POA: Insufficient documentation

## 2024-02-24 DIAGNOSIS — M545 Low back pain, unspecified: Secondary | ICD-10-CM | POA: Insufficient documentation

## 2024-02-24 DIAGNOSIS — R600 Localized edema: Secondary | ICD-10-CM | POA: Diagnosis not present

## 2024-02-24 DIAGNOSIS — C3492 Malignant neoplasm of unspecified part of left bronchus or lung: Secondary | ICD-10-CM | POA: Diagnosis not present

## 2024-02-24 DIAGNOSIS — C3412 Malignant neoplasm of upper lobe, left bronchus or lung: Secondary | ICD-10-CM | POA: Diagnosis present

## 2024-02-24 DIAGNOSIS — C3490 Malignant neoplasm of unspecified part of unspecified bronchus or lung: Secondary | ICD-10-CM

## 2024-02-24 DIAGNOSIS — T50995D Adverse effect of other drugs, medicaments and biological substances, subsequent encounter: Secondary | ICD-10-CM | POA: Diagnosis not present

## 2024-02-24 DIAGNOSIS — K5900 Constipation, unspecified: Secondary | ICD-10-CM | POA: Diagnosis not present

## 2024-02-24 DIAGNOSIS — L89109 Pressure ulcer of unspecified part of back, unspecified stage: Secondary | ICD-10-CM | POA: Diagnosis not present

## 2024-02-24 DIAGNOSIS — D696 Thrombocytopenia, unspecified: Secondary | ICD-10-CM | POA: Insufficient documentation

## 2024-02-24 DIAGNOSIS — M5440 Lumbago with sciatica, unspecified side: Secondary | ICD-10-CM

## 2024-02-24 DIAGNOSIS — C7951 Secondary malignant neoplasm of bone: Secondary | ICD-10-CM | POA: Diagnosis not present

## 2024-02-24 DIAGNOSIS — D6481 Anemia due to antineoplastic chemotherapy: Secondary | ICD-10-CM | POA: Insufficient documentation

## 2024-02-24 DIAGNOSIS — M549 Dorsalgia, unspecified: Secondary | ICD-10-CM | POA: Insufficient documentation

## 2024-02-24 LAB — CBC WITH DIFFERENTIAL (CANCER CENTER ONLY)
Abs Immature Granulocytes: 0.23 K/uL — ABNORMAL HIGH (ref 0.00–0.07)
Basophils Absolute: 0 K/uL (ref 0.0–0.1)
Basophils Relative: 0 %
Eosinophils Absolute: 0.1 K/uL (ref 0.0–0.5)
Eosinophils Relative: 1 %
HCT: 28.9 % — ABNORMAL LOW (ref 36.0–46.0)
Hemoglobin: 9.5 g/dL — ABNORMAL LOW (ref 12.0–15.0)
Immature Granulocytes: 3 %
Lymphocytes Relative: 3 %
Lymphs Abs: 0.3 K/uL — ABNORMAL LOW (ref 0.7–4.0)
MCH: 33.1 pg (ref 26.0–34.0)
MCHC: 32.9 g/dL (ref 30.0–36.0)
MCV: 100.7 fL — ABNORMAL HIGH (ref 80.0–100.0)
Monocytes Absolute: 0.7 K/uL (ref 0.1–1.0)
Monocytes Relative: 8 %
Neutro Abs: 7.5 K/uL (ref 1.7–7.7)
Neutrophils Relative %: 85 %
Platelet Count: 94 K/uL — ABNORMAL LOW (ref 150–400)
RBC: 2.87 MIL/uL — ABNORMAL LOW (ref 3.87–5.11)
RDW: 19.9 % — ABNORMAL HIGH (ref 11.5–15.5)
WBC Count: 8.9 K/uL (ref 4.0–10.5)
nRBC: 0 % (ref 0.0–0.2)

## 2024-02-24 LAB — CMP (CANCER CENTER ONLY)
ALT: 63 U/L — ABNORMAL HIGH (ref 0–44)
AST: 28 U/L (ref 15–41)
Albumin: 3.7 g/dL (ref 3.5–5.0)
Alkaline Phosphatase: 75 U/L (ref 38–126)
Anion gap: 10 (ref 5–15)
BUN: 38 mg/dL — ABNORMAL HIGH (ref 8–23)
CO2: 24 mmol/L (ref 22–32)
Calcium: 8.6 mg/dL — ABNORMAL LOW (ref 8.9–10.3)
Chloride: 100 mmol/L (ref 98–111)
Creatinine: 0.71 mg/dL (ref 0.44–1.00)
GFR, Estimated: >60 mL/min
Glucose, Bld: 100 mg/dL — ABNORMAL HIGH (ref 70–99)
Potassium: 4.5 mmol/L (ref 3.5–5.1)
Sodium: 134 mmol/L — ABNORMAL LOW (ref 135–145)
Total Bilirubin: 0.3 mg/dL (ref 0.0–1.2)
Total Protein: 5.5 g/dL — ABNORMAL LOW (ref 6.5–8.1)

## 2024-02-24 LAB — SAMPLE TO BLOOD BANK

## 2024-02-24 MED ORDER — AZITHROMYCIN 250 MG PO TABS: ORAL_TABLET | ORAL | 0 refills | Status: DC

## 2024-02-24 NOTE — Progress Notes (Signed)
 [facility] OFFICE PROGRESS NOTE  Linda Carwin, MD [address] [place] [address]  DIAGNOSIS:  Metastatic non-small cell lung cancer initially diagnosed as stage IIIc (T3, N3, M0) non-small cell lung cancer, adenocarcinoma presented with large left upper lobe lung mass in addition to left infrahilar and right hilar and subcarinal lymphadenopathy diagnosed in August 2021.   Molecular Biomarkers: Insufficient tissue for foundation 1. Guardant 360 results.   DETECTED ALTERATION(S) / BIOMARKER(S)% CFDNA OR AMPLIFICATIONASSOCIATED FDA-APPROVED THERAPIESCLINICAL TRIAL AVAILABILITY   EGFR L858R, 1.2%, Afatinib, Dacomitinib, Erlotinib, Gefitinib, Osimertinib , Ramucirumab Yes [id number], 1.0%, Afatinib, Dacomitinib, Erlotinib, Gefitinib, Osimertinib , Ramucirumab Yes [id number] 1.0% None     No  PRIOR THERAPY:  1) Weekly concurrent chemoradiation with carboplatin  for an AUC of 2 and paclitaxel  45 mg/m2.  First dose expected on 11/16/2019. Status post 7 cycles.  2) Tagrisso  80 mg p.o. daily. First dose started 01/31/2020.  Status post 42 months of treatment.  She has been off treatment for 4 months based on her request and secondary to adverse effects.   CURRENT THERAPY: Systemic chemotherapy with carboplatin  for AUC of 5, Alimta  500 mg/M2 and Tagrisso  80 mg p.o. daily.  First dose 11/21/2023.  Status post 4 cycles.  He had intolerance to chemotherapy and her carboplatin  was discontinued after 2 cycles.  She has been on single agent dose reduced Alimta  400 mg/m IV every 3 weeks and oral treatment with Tagrisso  starting from cycle #3.  INTERVAL HISTORY: Linda Woods 74 y.o. female returns to the clinic today for a follow-up visit accompanied by her daughter. The patient was last seen in clinic by Dr. Sherrod on 01/30/2024.    She is currently on IV chemotherapy with dose reduced Alimta  IV every 3 weeks and Tagrisso .  She is tolerating this better since carboplatin  was  discontinued.  She is scheduled for her next cycle of treatment later this week on 02/27/2024.  The patient was seen by [facility] a few days ago due to new onset sacral/lumbar pain.  This had been occurring since last week. The pain has been persistent and unchanged since onset. She denies any traumas or injuries.  The pain shoots down her right leg.  She denies any signs of cauda equina and denies any numbness and tingling or loss of control of her bowels or bladder.  She denies any other associated symptoms such as fevers or urinary changes.  I sent morphine  15 mg ER. She did not realize she could take her norco for break through pain. Therefore, he pain is not controlled. She also wears lidoderm  patches. They sent her prednisone . She is wondering if it is ok to take this.   She also is supposed to have a brain MRI soon which was ordered by her neurologist.   An x-ray performed at the orthopedic office.  The records are being faxed over.  She states that they did not see any obvious fracture.  She does have some degenerative changes in her low back.  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  She did previously receive palliative radiation with Dr. Shannon a few months ago. She is expected to have an MRI of the low back on Thursday this week. She has an appointment conflict with her chemotherapy which is also scheduled at this time.   She reports mild upper respiratory symptoms, including chest congestion, dry cough, and wheezing, without sore throat, dysgeusia, or increased dyspnea beyond baseline. She remains afebrile with preserved oral intake  and appetite. She uses albuterol  for wheezing. She has no known antibiotic allergies. She is also on trimethoprim daily but she is not taking the methenamine  in her med list.   She has a history of anemia requiring prior transfusions and has a port in place for labs and transfusions. She is unsure if she currently feels more weak or fatigued,  attributing decreased activity to pain and morphine  use. She has missed some infusions recently due to travel. Otherwise, she states she would be feeling fine besides the back pain and the mild URI symptoms. She did not test herself for flu or covid.   She does have an orthopedic provider and sees Dr. Marrion.   She is here today for acute visit today.   MEDICAL HISTORY: Past Medical History:  Diagnosis Date   Anemia    as a child   Anxiety    Asthma 10/19/2020   Bursitis    Chronic reflux esophagitis    Complication of anesthesia    Diarrhea, functional    Diverticulosis    GERD (gastroesophageal reflux disease)    History of kidney stones    History of radiation therapy    Lumbar Spine-12/02/23-12/16/23- Dr. Lynwood Nasuti   Hypertension    Knee pain    right knee-seeing ortho   lung ca 09/2019   Osteoarthritis    Plantar fasciitis    Pneumonia    PONV (postoperative nausea and vomiting)    has used the patch before and that helps   Pure hypercholesterolemia    RLS (restless legs syndrome)    Superficial vein thrombosis    Thyroid  disease    hypothyroid    ALLERGIES:  has no known allergies.  MEDICATIONS:  Current Outpatient Medications  Medication Sig Dispense Refill   azithromycin  (ZITHROMAX  Z-PAK) 250 MG tablet Take as directed 6 each 0   albuterol  (VENTOLIN  HFA) 108 (90 Base) MCG/ACT inhaler Inhale 2 puffs into the lungs every 6 (six) hours as needed for wheezing or shortness of breath. 8.5 g 0   alum & mag hydroxide-simeth (MAALOX/MYLANTA) 200-200-20 MG/5ML suspension Take 15 mLs by mouth every 6 (six) hours as needed for indigestion or heartburn. (Patient not taking: Reported on 01/22/2024) 355 mL 0   chlorhexidine  (HIBICLENS ) 4 % external liquid Apply 15 mLs (1 Application total) topically as directed for 30 doses. Use as directed daily for 5 days every other week for 6 weeks. (Patient not taking: Reported on 01/22/2024) 946 mL 1   cyanocobalamin  (VITAMIN B12) 1000  MCG tablet Take 1 tablet (1,000 mcg total) by mouth daily. 30 tablet 1   diphenoxylate -atropine  (LOMOTIL ) 2.5-0.025 MG tablet Take 1 tablet by mouth 4 (four) times daily as needed for diarrhea or loose stools. 30 tablet 0   famotidine  (PEPCID ) 40 MG tablet Take 40 mg by mouth at bedtime.     feeding supplement (ENSURE PLUS HIGH PROTEIN) LIQD Take 237 mLs by mouth 2 (two) times daily between meals. 14220 mL 1   ferrous sulfate  325 (65 FE) MG tablet Take 1 tablet (325 mg total) by mouth daily with breakfast. 30 tablet 1   fexofenadine (ALLEGRA) 180 MG tablet Take 180 mg by mouth daily as needed for allergies.     fluticasone  (FLONASE ) 50 MCG/ACT nasal spray Place 1 spray into both nostrils as needed for allergies.     folic acid  (FOLVITE ) 1 MG tablet Take 1 tablet (1 mg total) by mouth daily. Start 7 days before pemetrexed  chemotherapy. Continue until 21 days  after pemetrexed  completed. 100 tablet 3   HYDROcodone -acetaminophen  (NORCO/VICODIN) 5-325 MG tablet Take 1 tablet by mouth every 6 (six) hours as needed for moderate pain (pain score 4-6). 30 tablet 0   levothyroxine  (SYNTHROID ) 100 MCG tablet Take 1 tablet (100 mcg total) by mouth daily. One po qd 90 tablet 0   lidocaine  (LIDODERM ) 5 % Place 1 patch onto the skin daily as needed (back pain).     LORazepam  (ATIVAN ) 0.5 MG tablet Take 0.5 mg by mouth in the morning and at bedtime.     magnesium  oxide (MAG-OX) 400 MG tablet Take 1 tablet (400 mg total) by mouth daily. 30 tablet 0   methenamine  (HIPREX ) 1 g tablet Take 1 tablet (1 g total) by mouth 2 (two) times daily with a meal. (Patient not taking: Reported on 01/22/2024) 60 tablet 11   morphine  (MS CONTIN ) 15 MG 12 hr tablet Take 1 tablet (15 mg total) by mouth every 12 (twelve) hours. 30 tablet 0   Multiple Vitamin (MULTIVITAMIN WITH MINERALS) TABS tablet Take 1 tablet by mouth daily. 90 tablet 1   MYRBETRIQ  50 MG TB24 tablet Take 50 mg by mouth daily.     ondansetron  (ZOFRAN -ODT) 4 MG  disintegrating tablet Take 4-8 mg by mouth every 6 (six) hours as needed.     osimertinib  mesylate (TAGRISSO ) 80 MG tablet Take 1 tablet (80 mg total) by mouth daily. 30 tablet 3   oxyCODONE  (OXY IR/ROXICODONE ) 5 MG immediate release tablet Take 5 mg by mouth every 6 (six) hours as needed for severe pain (pain score 7-10). (Patient not taking: Reported on 01/22/2024)     potassium chloride  SA (KLOR-CON  M) 20 MEQ tablet Take 1 tablet (20 mEq total) by mouth daily for 10 days. 10 tablet 0   PREMARIN vaginal cream Place 1 applicator vaginally as needed (urinary issues).     prochlorperazine  (COMPAZINE ) 10 MG tablet Take 1 tablet (10 mg total) by mouth every 6 (six) hours as needed for nausea or vomiting. 30 tablet 1   RABEprazole  (ACIPHEX ) 20 MG tablet Take 1 tablet (20 mg total) by mouth 2 (two) times a week.     rosuvastatin  (CRESTOR ) 5 MG tablet Take 1 tablet (5 mg total) by mouth daily. (Patient not taking: Reported on 01/22/2024) 90 tablet 3   sodium chloride  1 g tablet Take 1 tablet (1 g total) by mouth 2 (two) times daily with a meal. (Patient not taking: Reported on 01/22/2024) 60 tablet 0   STIOLTO RESPIMAT  2.5-2.5 MCG/ACT AERS INHALE TWO PUFFS INTO THE LUNGS DAILY 4 g 5   triamcinolone  cream (KENALOG ) 0.1 % Apply 1 Application topically 2 (two) times daily. As needed for itching/rash (Patient not taking: Reported on 01/22/2024) 453.6 g 0   trimethoprim (TRIMPEX) 100 MG tablet Take 100 mg by mouth daily. (Patient not taking: Reported on 01/22/2024)     No current facility-administered medications for this visit.    SURGICAL HISTORY:  Past Surgical History:  Procedure Laterality Date   APPENDECTOMY     BRONCHIAL BIOPSY  10/20/2019   Procedure: BRONCHIAL BIOPSIES;  Surgeon: Shelah Lamar RAMAN, MD;  Location: [facility] ENDOSCOPY;  Service: Pulmonary;;   BRONCHIAL BRUSHINGS  10/20/2019   Procedure: BRONCHIAL BRUSHINGS;  Surgeon: Shelah Lamar RAMAN, MD;  Location: [facility] ENDOSCOPY;  Service: Pulmonary;;    BRONCHIAL NEEDLE ASPIRATION BIOPSY  10/20/2019   Procedure: BRONCHIAL NEEDLE ASPIRATION BIOPSIES;  Surgeon: Shelah Lamar RAMAN, MD;  Location: [facility] ENDOSCOPY;  Service: Pulmonary;;   BRONCHIAL WASHINGS  10/20/2019   Procedure: BRONCHIAL WASHINGS;  Surgeon: Shelah Lamar RAMAN, MD;  Location: [facility] ENDOSCOPY;  Service: Pulmonary;;   CARPAL TUNNEL RELEASE Right 2011   Dr. Camella   CHOLECYSTECTOMY N/A    COLPORRHAPHY     posterior   HAND SURGERY Right 2016   Nerve surgery, Dr. Camella   IR IMAGING GUIDED PORT INSERTION  01/13/2024   IR KYPHO LUMBAR INC FX REDUCE BONE BX UNI/BIL CANNULATION INC/IMAGING  07/03/2023   OVARIAN CYST REMOVAL     RECTOCELE REPAIR  2011   w/TVH and sling   TONSILLECTOMY     TONSILLECTOMY     TOTAL KNEE ARTHROPLASTY Left 09/09/2018   Procedure: TOTAL KNEE ARTHROPLASTY, CORTISONE INJECTION RIGHT KNEE;  Surgeon: Ernie Cough, MD;  Location: [facility] ORS;  Service: Orthopedics;  Laterality: Left;  70 mins   TOTAL KNEE ARTHROPLASTY Right 04/16/2023   Procedure: TOTAL KNEE ARTHROPLASTY;  Surgeon: Ernie Cough, MD;  Location: [facility] ORS;  Service: Orthopedics;  Laterality: Right;   TOTAL VAGINAL HYSTERECTOMY  10/18/2009   rectocele repair, sling   TUBAL LIGATION Bilateral    VARICOSE VEIN SURGERY     VIDEO BRONCHOSCOPY WITH ENDOBRONCHIAL NAVIGATION N/A 10/20/2019   Procedure: VIDEO BRONCHOSCOPY WITH ENDOBRONCHIAL NAVIGATION;  Surgeon: Shelah Lamar RAMAN, MD;  Location: [facility] ENDOSCOPY;  Service: Pulmonary;  Laterality: N/A;    REVIEW OF SYSTEMS:   Review of Systems  Constitutional: Stable fatigue. Negative for appetite change, chills, fever and unexpected weight change.  HENT: Mild congestion. Negative for mouth sores, nosebleeds, sore throat and trouble swallowing.   Eyes: Negative for eye problems and icterus.  Respiratory: Mild cough and wheezing. Negative for hemoptysis and shortness of breath. Cardiovascular: Negative for chest pain and leg swelling.  Gastrointestinal: Negative for  abdominal pain, constipation, diarrhea, nausea and vomiting.  Genitourinary: Negative for bladder incontinence, difficulty urinating, dysuria, frequency and hematuria.   Musculoskeletal: Positive for back pain. Negative for gait problem, neck pain and neck stiffness.  Skin: Negative for itching and rash.  Neurological: Negative for dizziness, extremity weakness, gait problem, headaches, light-headedness and seizures.  Hematological: Negative for adenopathy. Does not bruise/bleed easily.  Psychiatric/Behavioral: Negative for confusion, depression and sleep disturbance. The patient is not nervous/anxious.     PHYSICAL EXAMINATION:  Blood pressure 102/65, pulse (!) 102, temperature 98 F (36.7 C), temperature source Temporal, resp. rate 17, height 5' 4 (1.626 m), last menstrual period 08/21/2002, SpO2 100%.  ECOG PERFORMANCE STATUS: 1-2  Physical Exam  Constitutional: Oriented to person, place, and time and well-developed, well-nourished, and in no distress.  HENT:  Head: Normocephalic and atraumatic.  Mouth/Throat: Oropharynx is clear and moist. No oropharyngeal exudate.  Eyes: Conjunctivae are normal. Right eye exhibits no discharge. Left eye exhibits no discharge. No scleral icterus.  Neck: Normal range of motion. Neck supple.  Cardiovascular: Normal rate, regular rhythm, normal heart sounds and intact distal pulses.   Pulmonary/Chest: Effort normal and breath sounds normal. No respiratory distress. No wheezes. No rales.  Abdominal: Soft. Bowel sounds are normal. Exhibits no distension and no mass. There is no tenderness.  Musculoskeletal: Normal range of motion. Exhibits no edema.  Lymphadenopathy:    No cervical adenopathy.  Neurological: Alert and oriented to person, place, and time. Exhibits muscle wasting. Examined in the wheelchair.  Skin: Skin is warm and dry. No rash noted. Not diaphoretic. No erythema. No pallor.  Psychiatric: Mood, memory and judgment normal.  Vitals  reviewed.  LABORATORY DATA: Lab Results  Component Value Date   WBC  10.5 02/04/2024   HGB 10.0 (L) 02/04/2024   HCT 29.7 (L) 02/04/2024   MCV 96.4 02/04/2024   PLT 138 (L) 02/04/2024      Chemistry      Component Value Date/Time   NA 134 (L) 02/04/2024 1448   K 5.0 02/04/2024 1448   CL 103 02/04/2024 1448   CO2 21 (L) 02/04/2024 1448   BUN 48 (H) 02/04/2024 1448   CREATININE 0.86 02/04/2024 1448      Component Value Date/Time   CALCIUM  8.9 02/04/2024 1448   ALKPHOS 59 02/04/2024 1448   AST 25 02/04/2024 1448   ALT 38 02/04/2024 1448   BILITOT 0.4 02/04/2024 1448       RADIOGRAPHIC STUDIES:  No results found.   ASSESSMENT/PLAN:  This is a very pleasant 74 years old Caucasian female recently diagnosed with a stage IIIc/IV (T3, N3, M0/M1a) non-small cell lung cancer, adenocarcinoma presented with large left upper lobe lung mass in addition to left infrahilar, right hilar and subcarinal lymphadenopathy and small left pleural effusion diagnosed in August 2021 with positive EGFR mutation in exon 21 (L858R).    The patient completed a course of concurrent chemoradiation with weekly carboplatin  and paclitaxel  status post 7 cycles.  She tolerated her treatment well except for fatigue as well as a skin burn and cough. Her scan showed mild improvement of the left upper lobe lung mass as well as the mediastinal lymphadenopathy.   The patient continued to have small left pleural effusion that is suspicious given her presentation.   She then was on Tagrisso  80 mg p.o. daily started on January 31, 2020. She is status post 42 months of treatment. Her treatment was on hold par patient request and fatigue.    She also underwent kyphoplasty of L2 vertebral lesion and the biopsy was negative for malignancy.    She then had a PET scan performed on  which showed clear evidence for disease progression especially with bone metastasis in addition to increasing activity in the left hilar area  and suspicious left axillary lymphadenopathy.    She is currently undergoing systemic chemotherapy with carboplatin  for an AUC of 5 and Alimta  500 mg/m2 in addition to resuming treatment with Tagrisso  80 mg p.o. daily.  Her dose was reduced from cycle #2 to carboplatin  for an AUC of 4 and alimta  400 mg/m2 in which she still experienced significant cytopenias and was hospitalized for C.diff.  Therefore, carboplatin  was discontinued starting from cycle #3.  She is on dose reduced Alimta  400 mg IV every 3 weeks and Tagrisso  80 mg p.o. daily.  He is tolerating this better.  The patient was seen for an acute visit today due to her back pain.  She is expected to have an MRI later this week on 02/27/2024.  Encouraged her to keep this appointment.  If there is a cancer related concern for her back pain then we would recommend referral to palliative care and possible referral to Dr. Shannon.  There is an orthopedic concern for her arthritis and she will follow-up with her orthopedic provider.  I did let her know it is okay to take the prednisone .  I reviewed how to be taking her morphine .  The morphine  is extended release.  The intention is that this is not a long-term medication.  She will also continue her Salonpas patches.  The morphine  is to be taken twice a day and she can continue to take the Norco 6 hours as needed for breakthrough pain.  He will check her cell for flu and COVID when she returns home.  I did order chest x-ray if needed for an upper respiratory infection to ensure no URI.  Her oxygen is 100 and she is well-appearing today and does not appear in any acute distress.  She is afebrile.  Sent her antibiotics with doxycycline  just due to her immunocompromise state.  She will hold her trimethoprim while taking antibiotics.  If she does test positive for flu or COVID she was advised to let us  know as we would need to delay her appointments.  We did draw her CBC and CMP today prior to her infusion  on 02/27/2024.  She does not need a blood transfusion at this time.  Her platelet count is 94,000.  I will want to recheck this prior to her 02/27/2024 appointment.  We have requested copies of her [facility] from [facility].  I gave her the number to [facility] imaging which appears that is where her brain MRI is ordered for her.  Will see her back later this week prior to undergoing treatment.  Have moved her appointment 02/27/2024 to avoid the appointment conflict with her MRI.  The patient was advised to call immediately if she has any concerning symptoms in the interval. The patient voices understanding of current disease status and treatment options and is in agreement with the current care plan. All questions were answered. The patient knows to call the clinic with any problems, questions or concerns. We can certainly see the patient much sooner if necessary     Orders Placed This Encounter  Procedures   DG Chest 2 View    Standing Status:   Future    Expected Date:   02/24/2024    Expiration Date:   02/23/2025    Reason for Exam (SYMPTOM  OR DIAGNOSIS REQUIRED):   Lung cancer increased cough.    Preferred imaging location?:   [facility]    The total time spent in the appointment was 30-39 minutes  Juliane Guest L Shaneequa Bahner, PA-C 02/24/2024

## 2024-02-24 NOTE — Telephone Encounter (Signed)
 Answered and will said she will check mychart

## 2024-02-25 NOTE — Progress Notes (Signed)
 [facility] OFFICE PROGRESS NOTE  Sheryle Carwin, MD [address] [place] [address]  DIAGNOSIS: Metastatic non-small cell lung cancer initially diagnosed as stage IIIc (T3, N3, M0) non-small cell lung cancer, adenocarcinoma presented with large left upper lobe lung mass in addition to left infrahilar and right hilar and subcarinal lymphadenopathy diagnosed in August 2021.   Molecular Biomarkers: Insufficient tissue for foundation 1. Guardant 360 results.   DETECTED ALTERATION(S) / BIOMARKER(S)% CFDNA OR AMPLIFICATIONASSOCIATED FDA-APPROVED THERAPIESCLINICAL TRIAL AVAILABILITY   EGFR L858R, 1.2%, Afatinib, Dacomitinib, Erlotinib, Gefitinib, Osimertinib , Ramucirumab Yes [id number], 1.0%, Afatinib, Dacomitinib, Erlotinib, Gefitinib, Osimertinib , Ramucirumab Yes [id number] 1.0% None     No  PRIOR THERAPY: 1) Weekly concurrent chemoradiation with carboplatin  for an AUC of 2 and paclitaxel  45 mg/m2.  First dose expected on 11/16/2019. Status post 7 cycles.  2) Tagrisso  80 mg p.o. daily. First dose started 01/31/2020.  Status post 42 months of treatment.  She has been off treatment for 4 months based on her request and secondary to adverse effects.   CURRENT THERAPY: Systemic chemotherapy with carboplatin  for AUC of 5, Alimta  500 mg/M2 and Tagrisso  80 mg p.o. daily.  First dose 11/21/2023.  Status post 4 cycles.  He had intolerance to chemotherapy and her carboplatin  was discontinued after 2 cycles.  She has been on single agent dose reduced Alimta  400 mg/m IV every 3 weeks and oral treatment with Tagrisso  starting from cycle #3.   INTERVAL HISTORY: Linda Woods 74 y.o. female returns to the clinic today for a follow-up visit.  The patient was last seen in the clinic earlier this week for an acute visit.   At that point in time she was endorsing new onset back pain.  She scheduled for an MRI tomorrow.  She uses norco for breakthrough pain and long-acting morphine   twice daily.  Her pain is better controlled at this time now that she knows how to take her pain medication. She also is taking prednisone   Also had a mild upper respiratory symptoms earlier this week which have since improved.  She was given antibiotics. Her COVID and flu testing was negative.  Recent upper respiratory symptoms have resolved, with only a mild residual morning cough. She denies shortness of breath, sore throat, fever, chills, or night sweats.  She has developed bilateral ankle edema, worse on the right, which improves with leg elevation and compression stockings. She denies calf pain, erythema, or skin changes. She is not on anticoagulation and has a history of bilateral knee arthroplasty. She avoids dietary salt and no longer uses salt tablets.  She maintains adequate oral intake, including vitamin C  drinks and water . She experiences mild constipation, likely secondary to pain medication, but has regular bowel movements with Docusate. She denies nausea, vomiting, diarrhea, abdominal pain, abnormal bleeding, bruising, rashes, headaches, or visual changes.  She is here today for evaluation repeat blood blood work before undergoing cycle #5    MEDICAL HISTORY: Past Medical History:  Diagnosis Date   Anemia    as a child   Anxiety    Asthma 10/19/2020   Bursitis    Chronic reflux esophagitis    Complication of anesthesia    Diarrhea, functional    Diverticulosis    GERD (gastroesophageal reflux disease)    History of kidney stones    History of radiation therapy    Lumbar Spine-12/02/23-12/16/23- Dr. Lynwood Nasuti   Hypertension    Knee pain    right knee-seeing ortho   lung  ca 09/2019   Osteoarthritis    Plantar fasciitis    Pneumonia    PONV (postoperative nausea and vomiting)    has used the patch before and that helps   Pure hypercholesterolemia    RLS (restless legs syndrome)    Superficial vein thrombosis    Thyroid  disease    hypothyroid    ALLERGIES:   has no known allergies.  MEDICATIONS:  Current Outpatient Medications  Medication Sig Dispense Refill   albuterol  (VENTOLIN  HFA) 108 (90 Base) MCG/ACT inhaler Inhale 2 puffs into the lungs every 6 (six) hours as needed for wheezing or shortness of breath. 8.5 g 0   alum & mag hydroxide-simeth (MAALOX/MYLANTA) 200-200-20 MG/5ML suspension Take 15 mLs by mouth every 6 (six) hours as needed for indigestion or heartburn. (Patient not taking: Reported on 01/22/2024) 355 mL 0   azithromycin  (ZITHROMAX  Z-PAK) 250 MG tablet Take as directed 6 each 0   chlorhexidine  (HIBICLENS ) 4 % external liquid Apply 15 mLs (1 Application total) topically as directed for 30 doses. Use as directed daily for 5 days every other week for 6 weeks. (Patient not taking: Reported on 01/22/2024) 946 mL 1   cyanocobalamin  (VITAMIN B12) 1000 MCG tablet Take 1 tablet (1,000 mcg total) by mouth daily. 30 tablet 1   diphenoxylate -atropine  (LOMOTIL ) 2.5-0.025 MG tablet Take 1 tablet by mouth 4 (four) times daily as needed for diarrhea or loose stools. 30 tablet 0   famotidine  (PEPCID ) 40 MG tablet Take 40 mg by mouth at bedtime.     feeding supplement (ENSURE PLUS HIGH PROTEIN) LIQD Take 237 mLs by mouth 2 (two) times daily between meals. 14220 mL 1   ferrous sulfate  325 (65 FE) MG tablet Take 1 tablet (325 mg total) by mouth daily with breakfast. 30 tablet 1   fexofenadine (ALLEGRA) 180 MG tablet Take 180 mg by mouth daily as needed for allergies.     fluticasone  (FLONASE ) 50 MCG/ACT nasal spray Place 1 spray into both nostrils as needed for allergies.     folic acid  (FOLVITE ) 1 MG tablet Take 1 tablet (1 mg total) by mouth daily. Start 7 days before pemetrexed  chemotherapy. Continue until 21 days after pemetrexed  completed. 100 tablet 3   HYDROcodone -acetaminophen  (NORCO/VICODIN) 5-325 MG tablet Take 1 tablet by mouth every 6 (six) hours as needed for moderate pain (pain score 4-6). 30 tablet 0   levothyroxine  (SYNTHROID ) 100 MCG  tablet Take 1 tablet (100 mcg total) by mouth daily. One po qd 90 tablet 0   lidocaine  (LIDODERM ) 5 % Place 1 patch onto the skin daily as needed (back pain).     LORazepam  (ATIVAN ) 0.5 MG tablet Take 0.5 mg by mouth in the morning and at bedtime.     magnesium  oxide (MAG-OX) 400 MG tablet Take 1 tablet (400 mg total) by mouth daily. 30 tablet 0   methenamine  (HIPREX ) 1 g tablet Take 1 tablet (1 g total) by mouth 2 (two) times daily with a meal. (Patient not taking: Reported on 01/22/2024) 60 tablet 11   morphine  (MS CONTIN ) 15 MG 12 hr tablet Take 1 tablet (15 mg total) by mouth every 12 (twelve) hours. 30 tablet 0   Multiple Vitamin (MULTIVITAMIN WITH MINERALS) TABS tablet Take 1 tablet by mouth daily. 90 tablet 1   MYRBETRIQ  50 MG TB24 tablet Take 50 mg by mouth daily.     ondansetron  (ZOFRAN -ODT) 4 MG disintegrating tablet Take 4-8 mg by mouth every 6 (six) hours as needed.  osimertinib  mesylate (TAGRISSO ) 80 MG tablet Take 1 tablet (80 mg total) by mouth daily. 30 tablet 3   oxyCODONE  (OXY IR/ROXICODONE ) 5 MG immediate release tablet Take 5 mg by mouth every 6 (six) hours as needed for severe pain (pain score 7-10). (Patient not taking: Reported on 01/22/2024)     potassium chloride  SA (KLOR-CON  M) 20 MEQ tablet Take 1 tablet (20 mEq total) by mouth daily for 10 days. 10 tablet 0   PREMARIN vaginal cream Place 1 applicator vaginally as needed (urinary issues).     prochlorperazine  (COMPAZINE ) 10 MG tablet Take 1 tablet (10 mg total) by mouth every 6 (six) hours as needed for nausea or vomiting. 30 tablet 1   RABEprazole  (ACIPHEX ) 20 MG tablet Take 1 tablet (20 mg total) by mouth 2 (two) times a week.     rosuvastatin  (CRESTOR ) 5 MG tablet Take 1 tablet (5 mg total) by mouth daily. (Patient not taking: Reported on 01/22/2024) 90 tablet 3   sodium chloride  1 g tablet Take 1 tablet (1 g total) by mouth 2 (two) times daily with a meal. (Patient not taking: Reported on 01/22/2024) 60 tablet 0    STIOLTO RESPIMAT  2.5-2.5 MCG/ACT AERS INHALE TWO PUFFS INTO THE LUNGS DAILY 4 g 5   triamcinolone  cream (KENALOG ) 0.1 % Apply 1 Application topically 2 (two) times daily. As needed for itching/rash (Patient not taking: Reported on 01/22/2024) 453.6 g 0   trimethoprim (TRIMPEX) 100 MG tablet Take 100 mg by mouth daily. (Patient not taking: Reported on 01/22/2024)     No current facility-administered medications for this visit.    SURGICAL HISTORY:  Past Surgical History:  Procedure Laterality Date   APPENDECTOMY     BRONCHIAL BIOPSY  10/20/2019   Procedure: BRONCHIAL BIOPSIES;  Surgeon: Shelah Lamar RAMAN, MD;  Location: [facility] ENDOSCOPY;  Service: Pulmonary;;   BRONCHIAL BRUSHINGS  10/20/2019   Procedure: BRONCHIAL BRUSHINGS;  Surgeon: Shelah Lamar RAMAN, MD;  Location[facility] END[facility]COPY;  Service: Pulmonary;;   BRONCHIAL NEEDLE ASPIRATION BIOPSY  10/20/2019   Procedure: BRONCHIAL NEEDLE ASPIRATION BIOPSIES;  Surgeon: Shelah Lamar RAMAN, MD;  Location: [facility] ENDOSCOPY;  Service: Pulmonary;;   BRONCHIAL WASHINGS  10/20/2019   Procedure: BRONCHIAL WASHINGS;  Surgeon: Shelah Lamar RAMAN, MD;  Location: [facility] ENDOSCOPY;  Service: Pulmonary;;   CARPAL TUNNEL RELEASE Right 2011   Dr. Camella   CHOLECYSTECTOMY N/A    COLPORRHAPHY     posterior   HAND SURGERY Right 2016   Nerve surgery, Dr. Camella   IR IMAGING GUIDED PORT INSERTION  01/13/2024   IR KYPHO LUMBAR INC FX REDUCE BONE BX UNI/BIL CANNULATION INC/IMAGING  07/03/2023   OVARIAN CYST REMOVAL     RECTOCELE REPAIR  2011   w/TVH and sling   TONSILLECTOMY     TONSILLECTOMY     TOTAL KNEE ARTHROPLASTY Left 09/09/2018   Procedure: TOTAL KNEE ARTHROPLASTY, CORTISONE INJECTION RIGHT KNEE;  Surgeon: Ernie Cough, MD;  Location: [facility];  Service: Orthopedics;  Laterality: Left;  70 mins   TOTAL KNEE ARTHROPLASTY Right 04/16/2023   Procedure: TOTAL KNEE ARTHROPLASTY;  Surgeon: Ernie Cough, MD;  Location: [facility];  Service: Orthopedics;  Laterality: Right;   TOTAL  VAGINAL HYSTERECTOMY  10/18/2009   rectocele repair, sling   TUBAL LIGATION Bilateral    VARICOSE VEIN SURGERY     VIDEO BRONCHOSCOPY WITH ENDOBRONCHIAL NAVIGATION N/A 10/20/2019   Procedure: VIDEO BRONCHOSCOPY WITH ENDOBRONCHIAL NAVIGATION;  Surgeon: Shelah Lamar RAMAN, MD;  Location: [facility];  Service: Pulmonary;  Laterality: N/A;    REVIEW OF SYSTEMS:   Review of Systems  Constitutional: Stable fatigue. Negative for appetite change, chills, fever and unexpected weight change.  HENT: Negative for mouth sores, nosebleeds, sore throat and trouble swallowing.   Eyes: Negative for eye problems and icterus.  Respiratory: Improved cough. Negative for hemoptysis, shortness of breath and wheezing.   Cardiovascular: Negative for chest pain. Very mild ankle swelling.  Gastrointestinal: Mild constipation. Negative for abdominal pain, diarrhea, nausea and vomiting.  Genitourinary: Negative for bladder incontinence, difficulty urinating, dysuria, frequency and hematuria.   Musculoskeletal: Improved but persistent low back pain. Negative for gait problem, neck pain and neck stiffness.  Skin: Negative for itching and rash.  Neurological: Negative for dizziness, extremity weakness, gait problem, headaches, light-headedness and seizures.  Hematological: Negative for adenopathy. Does not bruise/bleed easily.  Psychiatric/Behavioral: Negative for confusion, depression and sleep disturbance. The patient is not nervous/anxious.     PHYSICAL EXAMINATION:  Blood pressure 105/73, pulse 97, temperature 98.2 F (36.8 C), temperature source Temporal, resp. rate 16, height 5' 4 (1.626 m), weight 131 lb (59.4 kg), last menstrual period 08/21/2002, SpO2 100%.  ECOG PERFORMANCE STATUS: 1  Physical Exam  Constitutional: Oriented to person, place, and time and well-developed, well-nourished, and in no distress.  HENT:  Head: Normocephalic and atraumatic.  Mouth/Throat: Oropharynx is clear and moist. No  oropharyngeal exudate.  Eyes: Conjunctivae are normal. Right eye exhibits no discharge. Left eye exhibits no discharge. No scleral icterus.  Neck: Normal range of motion. Neck supple.  Cardiovascular: Normal rate, regular rhythm, normal heart sounds and intact distal pulses.   Pulmonary/Chest: Effort normal and breath sounds normal. No respiratory distress. No wheezes. No rales.  Abdominal: Soft. Bowel sounds are normal. Exhibits no distension and no mass. There is no tenderness.  Musculoskeletal: Normal range of motion. Mild ankle swelling.  Lymphadenopathy:    No cervical adenopathy.  Neurological: Alert and oriented to person, place, and time. Exhibits muscle wasting. Examined in the wheelchair.  Skin: Skin is warm and dry. No rash noted. Not diaphoretic. No erythema. No pallor.  Psychiatric: Mood, memory and judgment normal.  Vitals reviewed.  LABORATORY DATA: Lab Results  Component Value Date   WBC 11.8 (H) 02/27/2024   HGB 9.2 (L) 02/27/2024   HCT 28.7 (L) 02/27/2024   MCV 102.5 (H) 02/27/2024   PLT 101 (L) 02/27/2024      Chemistry      Component Value Date/Time   NA 134 (L) 02/24/2024 1512   K 4.5 02/24/2024 1512   CL 100 02/24/2024 1512   CO2 24 02/24/2024 1512   BUN 38 (H) 02/24/2024 1512   CREATININE 0.71 02/24/2024 1512      Component Value Date/Time   CALCIUM  8.6 (L) 02/24/2024 1512   ALKPHOS 75 02/24/2024 1512   AST 28 02/24/2024 1512   ALT 63 (H) 02/24/2024 1512   BILITOT 0.3 02/24/2024 1512       RADIOGRAPHIC STUDIES:  No results found.   ASSESSMENT/PLAN:  This is a very pleasant 74 years old Caucasian female recently diagnosed with a stage IIIc/IV (T3, N3, M0/M1a) non-small cell lung cancer, adenocarcinoma presented with large left upper lobe lung mass in addition to left infrahilar, right hilar and subcarinal lymphadenopathy and small left pleural effusion diagnosed in August 2021 with positive EGFR mutation in exon 21 (L858R).   The patient  completed a course of concurrent chemoradiation with weekly carboplatin  and paclitaxel  status post 7 cycles.  She tolerated her treatment well  except for fatigue as well as a skin burn and cough. Her scan showed mild improvement of the left upper lobe lung mass as well as the mediastinal lymphadenopathy.   The patient continued to have small left pleural effusion that is suspicious given her presentation.   She then was on Tagrisso  80 mg p.o. daily started on January 31, 2020. She is status post 42 months of treatment. Her treatment was on hold par patient request and fatigue.    She also underwent kyphoplasty of L2 vertebral lesion and the biopsy was negative for malignancy.    She then had a PET scan performed on  which showed clear evidence for disease progression especially with bone metastasis in addition to increasing activity in the left hilar area and suspicious left axillary lymphadenopathy.    She is currently undergoing systemic chemotherapy with carboplatin  for an AUC of 5 and Alimta  500 mg/m2 in addition to resuming treatment with Tagrisso  80 mg p.o. daily.  Her dose was reduced from cycle #2 to carboplatin  for an AUC of 4 and alimta  400 mg/m2 in which she still experienced significant cytopenias and was hospitalized for C.diff.  Therefore, carboplatin  was discontinued starting from cycle #3.  She is on dose reduced Alimta  400 mg IV every 3 weeks and Tagrisso  80 mg p.o. daily.  Labs were reviewed. Reviewed with Dr. Sherrod. Her PLT count is 101k. Recommend she proceed with cycle #5 today as scheduled.   I will arrange for a repeat CT of the chest prior to the next appointment. I will not order AP since she is getting MRI tomorrow.   We will see her in 3 weeks before undergoing cycle #6.   She is expected to have an MRI tomorrow. I gave them my fax number to fax the results from [facility].   She will continue the ER morphine  and norco every 6 hours for breakthrough pain. Her pain  is under better control.   She is interested in seeing palliative care. I will place referral.   We will see her for follow up in 3 weeks.   I have standing order for sample to blood bank.  Thrombocytopenia - Rechecked blood count to monitor platelet levels. - Will check CBC in about 1.5 weeks to monitor  Peripheral edema Bilateral ankle edema, mild, improves with elevation and compression. No DVT or infection. No diuretics needed. - Advised continued use of compression stockings during the day. - Advised leg elevation, especially at night. - Recommended sodium restriction to minimize fluid retention. - Instructed to report worsening edema, erythema, or calf pain for potential Doppler ultrasound.  Constipation Mild, likely opioid-induced, managed with stool softener, hydration, and fiber. No severe complications. - Advised to continue stool softener as needed. - Encouraged increased fluid intake and dietary fiber.  The patient was advised to call immediately if she has any concerning symptoms in the interval. The patient voices understanding of current disease status and treatment options and is in agreement with the current care plan. All questions were answered. The patient knows to call the clinic with any problems, questions or concerns. We can certainly see the patient much sooner if necessary    Orders Placed This Encounter  Procedures   CT Chest W Contrast    Standing Status:   Future    Expected Date:   03/12/2024    Expiration Date:   02/26/2025    If indicated for the ordered procedure, I authorize the administration of contrast media per  Radiology protocol:   Yes    Does the patient have a contrast media/X-ray dye allergy?:   No    Preferred imaging location?:   [facility]   Amb Referral to Palliative Care    Referral Priority:   Routine    Referral Type:   Consultation    Number of Visits Requested:   1   Sample to Blood Bank    Standing Status:    Standing    Number of Occurrences:   2    Next Expected Occurrence:   02/28/2024    Expiration Date:   02/26/2025     The total time spent in the appointment was 20-29 minutes  Emonte Dieujuste L Kammi Hechler, PA-C 02/27/2024

## 2024-02-27 ENCOUNTER — Inpatient Hospital Stay

## 2024-02-27 ENCOUNTER — Ambulatory Visit: Admitting: Internal Medicine

## 2024-02-27 ENCOUNTER — Inpatient Hospital Stay: Payer: Self-pay

## 2024-02-27 ENCOUNTER — Inpatient Hospital Stay: Payer: Self-pay | Admitting: Physician Assistant

## 2024-02-27 ENCOUNTER — Inpatient Hospital Stay: Payer: Self-pay | Attending: Internal Medicine

## 2024-02-27 ENCOUNTER — Inpatient Hospital Stay (HOSPITAL_BASED_OUTPATIENT_CLINIC_OR_DEPARTMENT_OTHER): Payer: Self-pay | Admitting: Physician Assistant

## 2024-02-27 VITALS — BP 119/71 | HR 96 | Temp 97.5°F | Resp 16

## 2024-02-27 VITALS — BP 105/73 | HR 97 | Temp 98.2°F | Resp 16 | Ht 64.0 in | Wt 131.0 lb

## 2024-02-27 DIAGNOSIS — C3492 Malignant neoplasm of unspecified part of left bronchus or lung: Secondary | ICD-10-CM

## 2024-02-27 DIAGNOSIS — C3490 Malignant neoplasm of unspecified part of unspecified bronchus or lung: Secondary | ICD-10-CM | POA: Diagnosis not present

## 2024-02-27 DIAGNOSIS — Z5111 Encounter for antineoplastic chemotherapy: Secondary | ICD-10-CM | POA: Diagnosis not present

## 2024-02-27 LAB — CBC WITH DIFFERENTIAL (CANCER CENTER ONLY)
Abs Immature Granulocytes: 0.19 K/uL — ABNORMAL HIGH (ref 0.00–0.07)
Basophils Absolute: 0 K/uL (ref 0.0–0.1)
Basophils Relative: 0 %
Eosinophils Absolute: 0 K/uL (ref 0.0–0.5)
Eosinophils Relative: 0 %
HCT: 28.7 % — ABNORMAL LOW (ref 36.0–46.0)
Hemoglobin: 9.2 g/dL — ABNORMAL LOW (ref 12.0–15.0)
Immature Granulocytes: 2 %
Lymphocytes Relative: 2 %
Lymphs Abs: 0.2 K/uL — ABNORMAL LOW (ref 0.7–4.0)
MCH: 32.9 pg (ref 26.0–34.0)
MCHC: 32.1 g/dL (ref 30.0–36.0)
MCV: 102.5 fL — ABNORMAL HIGH (ref 80.0–100.0)
Monocytes Absolute: 1 K/uL (ref 0.1–1.0)
Monocytes Relative: 8 %
Neutro Abs: 10.3 K/uL — ABNORMAL HIGH (ref 1.7–7.7)
Neutrophils Relative %: 88 %
Platelet Count: 101 K/uL — ABNORMAL LOW (ref 150–400)
RBC: 2.8 MIL/uL — ABNORMAL LOW (ref 3.87–5.11)
RDW: 19.2 % — ABNORMAL HIGH (ref 11.5–15.5)
WBC Count: 11.8 K/uL — ABNORMAL HIGH (ref 4.0–10.5)
nRBC: 0 % (ref 0.0–0.2)

## 2024-02-27 MED ORDER — SODIUM CHLORIDE 0.9 % IV SOLN: INTRAVENOUS | Status: DC

## 2024-02-27 MED ORDER — PROCHLORPERAZINE MALEATE 10 MG PO TABS
10.0000 mg | ORAL_TABLET | Freq: Once | ORAL | Status: AC
  Administered 2024-02-27: 10 mg via ORAL
  Filled 2024-02-27: qty 1

## 2024-02-27 MED ORDER — SODIUM CHLORIDE 0.9 % IV SOLN
400.0000 mg/m2 | Freq: Once | INTRAVENOUS | Status: AC
  Administered 2024-02-27: 600 mg via INTRAVENOUS
  Filled 2024-02-27: qty 20

## 2024-02-27 MED ORDER — SODIUM CHLORIDE 0.9% FLUSH: 10.0000 mL | INTRAVENOUS | Status: DC | PRN

## 2024-02-27 NOTE — Patient Instructions (Signed)
 [facility] - A DEPT OF [facility]  Discharge Instructions: Thank you for choosing [facility] to provide your oncology and hematology care.   If you have a lab appointment with the Cancer Center, please go directly to the Cancer Center and check in at the registration area.   Wear comfortable clothing and clothing appropriate for easy access to any Portacath or PICC line.   We strive to give you quality time with your provider. You may need to reschedule your appointment if you arrive late (15 or more minutes).  Arriving late affects you and other patients whose appointments are after yours.  Also, if you miss three or more appointments without notifying the office, you may be dismissed from the clinic at the provider's discretion.      For prescription refill requests, have your pharmacy contact our office and allow 72 hours for refills to be completed.    Today you received the following chemotherapy and/or immunotherapy agents Alimta      To help prevent nausea and vomiting after your treatment, we encourage you to take your nausea medication as directed.  BELOW ARE SYMPTOMS THAT SHOULD BE REPORTED IMMEDIATELY: *FEVER GREATER THAN 100.4 F (38 C) OR HIGHER *CHILLS OR SWEATING *NAUSEA AND VOMITING THAT IS NOT CONTROLLED WITH YOUR NAUSEA MEDICATION *UNUSUAL SHORTNESS OF BREATH *UNUSUAL BRUISING OR BLEEDING *URINARY PROBLEMS (pain or burning when urinating, or frequent urination) *BOWEL PROBLEMS (unusual diarrhea, constipation, pain near the anus) TENDERNESS IN MOUTH AND THROAT WITH OR WITHOUT PRESENCE OF ULCERS (sore throat, sores in mouth, or a toothache) UNUSUAL RASH, SWELLING OR PAIN  UNUSUAL VAGINAL DISCHARGE OR ITCHING   Items with * indicate a potential emergency and should be followed up as soon as possible or go to the Emergency Department if any problems should occur.  Please show the CHEMOTHERAPY ALERT CARD or IMMUNOTHERAPY  ALERT CARD at check-in to the Emergency Department and triage nurse.  Should you have questions after your visit or need to cancel or reschedule your appointment, please contact [facility] - A DEPT OF [facility]  Dept: [phone]  and follow the prompts.  Office hours are 8:00 a.m. to 4:30 p.m. Monday - Friday. Please note that voicemails left after 4:00 p.m. may not be returned until the following business day.  We are closed weekends and major holidays. You have access to a nurse at all times for urgent questions. Please call the main number to the clinic Dept: [phone] and follow the prompts.   For any non-urgent questions, you may also contact your provider using MyChart. We now offer e-Visits for anyone 79 and older to request care online for non-urgent symptoms. For details visit [email or web address].   Also download the MyChart app! Go to the app store, search "MyChart", open the app, select [place], and log in with your MyChart username and password.

## 2024-02-28 ENCOUNTER — Other Ambulatory Visit: Payer: Self-pay

## 2024-03-05 ENCOUNTER — Other Ambulatory Visit: Payer: Self-pay | Admitting: Internal Medicine

## 2024-03-05 ENCOUNTER — Other Ambulatory Visit (HOSPITAL_COMMUNITY): Payer: Self-pay

## 2024-03-05 ENCOUNTER — Encounter: Payer: Self-pay | Admitting: Internal Medicine

## 2024-03-05 ENCOUNTER — Other Ambulatory Visit: Payer: Self-pay

## 2024-03-05 ENCOUNTER — Telehealth: Payer: Self-pay

## 2024-03-05 ENCOUNTER — Other Ambulatory Visit: Payer: Self-pay | Admitting: Pharmacy Technician

## 2024-03-05 DIAGNOSIS — M549 Dorsalgia, unspecified: Secondary | ICD-10-CM

## 2024-03-05 MED ORDER — MORPHINE SULFATE ER 15 MG PO TBCR: 15.0000 mg | EXTENDED_RELEASE_TABLET | Freq: Two times a day (BID) | ORAL | 0 refills | Status: DC

## 2024-03-05 MED ORDER — OXYCODONE HCL 5 MG PO TABS: 5.0000 mg | ORAL_TABLET | Freq: Four times a day (QID) | ORAL | 0 refills | Status: DC | PRN

## 2024-03-05 NOTE — Telephone Encounter (Addendum)
 Spoke with patient regarding pain medication refills. Patient reports she will run out of morphine  before the weekend. She states she has oxycodone  remaining from a prior surgery in 2025 and Norco available from when she received radiation with Dr. Shannon. Patient reports that oxycodone  provides better relief for her breakthrough pain related to new sacral fractures identified on MRI from [facility]. Advised patient that this information will be relayed to Dr. Sherrod for review.  Discussed follow-up plan related to her spine MRI; patient stated, I dont know. Reached out to [facility] and requested most recent records from their office.

## 2024-03-05 NOTE — Telephone Encounter (Signed)
 Oral Oncology Patient Advocate Encounter  Was successful in securing patient a $6000 grant from [facility] to provide copayment coverage for Tagrisso .  This will keep the out of pocket expense at $0.     [employer] ID: [id number]   The billing information is as follows and has been shared with [facility].    RxBin: [id number] PCN: [id number] Member ID: [id number] Group ID: [id number] Dates of Eligibility: 02/04/24 through 02/02/25  Fund:  NSCLC  Lucie Lamer, CPhT [place]  [facility] Oncology Pharmacy Patient Advocate Specialist II [place] Phone: [phone]  Fax: [phone] [email or web address]

## 2024-03-06 ENCOUNTER — Other Ambulatory Visit: Payer: Self-pay

## 2024-03-09 ENCOUNTER — Other Ambulatory Visit: Payer: Self-pay

## 2024-03-09 NOTE — Progress Notes (Signed)
 Clinical Intervention Note  Clinical Intervention Notes: Patient reported starting morphine  ER for back pain. Per Micromedex, when taken with a P-gp inhibitor, there may be an increase in morphine  exposure, increasing the risk of side effects. However, both Tagrisso  and pain are being managed by Dr. Sherrod and he and his PA are monitoring for this.   Clinical Intervention Outcomes: Prevention of an adverse drug event   Advertising Account Planner

## 2024-03-09 NOTE — Progress Notes (Signed)
 Specialty Pharmacy Refill Coordination Note  Linda Woods is a 74 y.o. female contacted today regarding refills of specialty medication(s) Osimertinib  Mesylate (TAGRISSO )   Patient requested Delivery   Delivery date: 03/12/24   Verified address: [address] [place] [address]   Medication will be filled on: 03/11/24

## 2024-03-10 ENCOUNTER — Inpatient Hospital Stay

## 2024-03-10 ENCOUNTER — Other Ambulatory Visit (HOSPITAL_COMMUNITY): Payer: Self-pay

## 2024-03-10 DIAGNOSIS — Z5111 Encounter for antineoplastic chemotherapy: Secondary | ICD-10-CM | POA: Diagnosis not present

## 2024-03-10 DIAGNOSIS — C3490 Malignant neoplasm of unspecified part of unspecified bronchus or lung: Secondary | ICD-10-CM

## 2024-03-10 LAB — CMP (CANCER CENTER ONLY)
ALT: 47 U/L — ABNORMAL HIGH (ref 0–44)
AST: 30 U/L (ref 15–41)
Albumin: 3.5 g/dL (ref 3.5–5.0)
Alkaline Phosphatase: 86 U/L (ref 38–126)
Anion gap: 10 (ref 5–15)
BUN: 26 mg/dL — ABNORMAL HIGH (ref 8–23)
CO2: 25 mmol/L (ref 22–32)
Calcium: 8.7 mg/dL — ABNORMAL LOW (ref 8.9–10.3)
Chloride: 102 mmol/L (ref 98–111)
Creatinine: 0.87 mg/dL (ref 0.44–1.00)
GFR, Estimated: >60 mL/min
Glucose, Bld: 110 mg/dL — ABNORMAL HIGH (ref 70–99)
Potassium: 4 mmol/L (ref 3.5–5.1)
Sodium: 137 mmol/L (ref 135–145)
Total Bilirubin: <0.2 mg/dL (ref 0.0–1.2)
Total Protein: 5.5 g/dL — ABNORMAL LOW (ref 6.5–8.1)

## 2024-03-10 LAB — CBC WITH DIFFERENTIAL (CANCER CENTER ONLY)
Abs Immature Granulocytes: 0.05 K/uL (ref 0.00–0.07)
Basophils Absolute: 0 K/uL (ref 0.0–0.1)
Basophils Relative: 0 %
Eosinophils Absolute: 0 K/uL (ref 0.0–0.5)
Eosinophils Relative: 0 %
HCT: 26.2 % — ABNORMAL LOW (ref 36.0–46.0)
Hemoglobin: 8.7 g/dL — ABNORMAL LOW (ref 12.0–15.0)
Immature Granulocytes: 1 %
Lymphocytes Relative: 5 %
Lymphs Abs: 0.4 K/uL — ABNORMAL LOW (ref 0.7–4.0)
MCH: 33.9 pg (ref 26.0–34.0)
MCHC: 33.2 g/dL (ref 30.0–36.0)
MCV: 101.9 fL — ABNORMAL HIGH (ref 80.0–100.0)
Monocytes Absolute: 1 K/uL (ref 0.1–1.0)
Monocytes Relative: 12 %
Neutro Abs: 6.9 K/uL (ref 1.7–7.7)
Neutrophils Relative %: 82 %
Platelet Count: 101 K/uL — ABNORMAL LOW (ref 150–400)
RBC: 2.57 MIL/uL — ABNORMAL LOW (ref 3.87–5.11)
RDW: 18.8 % — ABNORMAL HIGH (ref 11.5–15.5)
WBC Count: 8.4 K/uL (ref 4.0–10.5)
nRBC: 0 % (ref 0.0–0.2)

## 2024-03-11 ENCOUNTER — Inpatient Hospital Stay

## 2024-03-11 ENCOUNTER — Other Ambulatory Visit: Payer: Self-pay

## 2024-03-11 ENCOUNTER — Inpatient Hospital Stay: Admitting: Dietician

## 2024-03-11 ENCOUNTER — Inpatient Hospital Stay: Admitting: Physician Assistant

## 2024-03-17 NOTE — Progress Notes (Unsigned)
 [facility] OFFICE PROGRESS NOTE  Sheryle Carwin, MD [address] [place] [address]  DIAGNOSIS:  Metastatic non-small cell lung cancer initially diagnosed as stage IIIc (T3, N3, M0) non-small cell lung cancer, adenocarcinoma presented with large left upper lobe lung mass in addition to left infrahilar and right hilar and subcarinal lymphadenopathy diagnosed in August 2021.   Molecular Biomarkers: Insufficient tissue for foundation 1. Guardant 360 results.   DETECTED ALTERATION(S) / BIOMARKER(S)% CFDNA OR AMPLIFICATIONASSOCIATED FDA-APPROVED THERAPIESCLINICAL TRIAL AVAILABILITY   EGFR L858R, 1.2%, Afatinib, Dacomitinib, Erlotinib, Gefitinib, Osimertinib , Ramucirumab Yes [id number], 1.0%, Afatinib, Dacomitinib, Erlotinib, Gefitinib, Osimertinib , Ramucirumab Yes [id number] 1.0% None     No  PRIOR THERAPY: 1) Weekly concurrent chemoradiation with carboplatin  for an AUC of 2 and paclitaxel  45 mg/m2.  First dose expected on 11/16/2019. Status post 7 cycles.  2) Tagrisso  80 mg p.o. daily. First dose started 01/31/2020.  Status post 42 months of treatment.  She has been off treatment for 4 months based on her request and secondary to adverse effects.  3) Palliative radiation to the spine under the care Dr. Shannon.   CURRENT THERAPY: Systemic chemotherapy with carboplatin  for AUC of 5, Alimta  500 mg/M2 and Tagrisso  80 mg p.o. daily.  First dose 11/21/2023.  Status post 5 cycles.  He had intolerance to chemotherapy and her carboplatin  was discontinued after 2 cycles.  She has been on single agent dose reduced Alimta  400 mg/m IV every 3 weeks and oral treatment with Tagrisso  starting from cycle #3.   INTERVAL HISTORY: Linda Woods 74 y.o. female returns to the clinic today for a follow up visit accompanied by her friend. She was last seen in the clinic on 02/27/24.   At that time she was having worsening back pain in the sacral area.  Her pain has worsened since she was  last seen.  She has been using heating pads as well as taking the morphine  and oxycodone .  She is expected to establish care with palliative care next week.  An MRI performed at [facility].  Her follow-up is scheduled for the end of February.  There is a copy of her MRI scanned under the media tab.  This showed new sacral insufficiency fractures, arthritis, spinal stenosis, stable bone mets with the exception of L5 was noted to be increased in size. She has a history of vertebral augmentation. She has received prior radiation to the back. The known S2 lesion remains stable on imaging.  She experiences daily constipation, managed with Miralax  as needed and fiber gummies. Stools are loose but not diarrheal. She denies fevers, chills, night sweats, cough, or significant changes in breathing. She reports increased fatigue, which she attributes to a downward trend in her red blood cell count, though she remains above transfusion threshold. She continues iron supplement. She is scheduled for a brain MRI on February 14th. She is scheduled to see a member of the nutritionist team while in the infusion room today.   She recently had a restaging CT scan.   She is here for evaluation and repeat blood work before undergoing #6.      MEDICAL HISTORY: Past Medical History:  Diagnosis Date   Anemia    as a child   Anxiety    Asthma 10/19/2020   Bursitis    Chronic reflux esophagitis    Complication of anesthesia    Diarrhea, functional    Diverticulosis    GERD (gastroesophageal reflux disease)    History of kidney stones  History of radiation therapy    Lumbar Spine-12/02/23-12/16/23- Dr. Lynwood Nasuti   Hypertension    Knee pain    right knee-seeing ortho   lung ca 09/2019   Osteoarthritis    Plantar fasciitis    Pneumonia    PONV (postoperative nausea and vomiting)    has used the patch before and that helps   Pure hypercholesterolemia    RLS (restless legs syndrome)    Superficial vein  thrombosis    Thyroid  disease    hypothyroid    ALLERGIES:  has no known allergies.  MEDICATIONS:  Current Outpatient Medications  Medication Sig Dispense Refill   albuterol  (VENTOLIN  HFA) 108 (90 Base) MCG/ACT inhaler Inhale 2 puffs into the lungs every 6 (six) hours as needed for wheezing or shortness of breath. 8.5 g 0   cyanocobalamin  (VITAMIN B12) 1000 MCG tablet Take 1 tablet (1,000 mcg total) by mouth daily. 30 tablet 1   diphenoxylate -atropine  (LOMOTIL ) 2.5-0.025 MG tablet Take 1 tablet by mouth 4 (four) times daily as needed for diarrhea or loose stools. 30 tablet 0   famotidine  (PEPCID ) 40 MG tablet Take 40 mg by mouth at bedtime.     feeding supplement (ENSURE PLUS HIGH PROTEIN) LIQD Take 237 mLs by mouth 2 (two) times daily between meals. 14220 mL 1   ferrous sulfate  325 (65 FE) MG tablet Take 1 tablet (325 mg total) by mouth daily with breakfast. 30 tablet 1   fexofenadine (ALLEGRA) 180 MG tablet Take 180 mg by mouth daily as needed for allergies.     fluticasone  (FLONASE ) 50 MCG/ACT nasal spray Place 1 spray into both nostrils as needed for allergies.     folic acid  (FOLVITE ) 1 MG tablet Take 1 tablet (1 mg total) by mouth daily. Start 7 days before pemetrexed  chemotherapy. Continue until 21 days after pemetrexed  completed. 100 tablet 3   levothyroxine  (SYNTHROID ) 100 MCG tablet Take 1 tablet (100 mcg total) by mouth daily. One po qd 90 tablet 0   lidocaine  (LIDODERM ) 5 % Place 1 patch onto the skin daily as needed (back pain).     LORazepam  (ATIVAN ) 0.5 MG tablet Take 0.5 mg by mouth in the morning and at bedtime.     magnesium  oxide (MAG-OX) 400 MG tablet Take 1 tablet (400 mg total) by mouth daily. 30 tablet 0   Multiple Vitamin (MULTIVITAMIN WITH MINERALS) TABS tablet Take 1 tablet by mouth daily. 90 tablet 1   MYRBETRIQ  50 MG TB24 tablet Take 50 mg by mouth daily.     ondansetron  (ZOFRAN -ODT) 4 MG disintegrating tablet Take 4-8 mg by mouth every 6 (six) hours as needed.      osimertinib  mesylate (TAGRISSO ) 80 MG tablet Take 1 tablet (80 mg total) by mouth daily. 30 tablet 3   potassium chloride  SA (KLOR-CON  M) 20 MEQ tablet Take 1 tablet (20 mEq total) by mouth daily for 10 days. 10 tablet 0   PREMARIN vaginal cream Place 1 applicator vaginally as needed (urinary issues).     prochlorperazine  (COMPAZINE ) 10 MG tablet Take 1 tablet (10 mg total) by mouth every 6 (six) hours as needed for nausea or vomiting. 30 tablet 1   RABEprazole  (ACIPHEX ) 20 MG tablet Take 1 tablet (20 mg total) by mouth 2 (two) times a week.     rosuvastatin  (CRESTOR ) 5 MG tablet Take 1 tablet (5 mg total) by mouth daily. 90 tablet 3   STIOLTO RESPIMAT  2.5-2.5 MCG/ACT AERS INHALE TWO PUFFS INTO THE LUNGS DAILY 4  g 5   triamcinolone  cream (KENALOG ) 0.1 % Apply 1 Application topically 2 (two) times daily. As needed for itching/rash (Patient taking differently: Apply 1 Application topically as needed (As needed for itching/rash). As needed for itching/rash) 453.6 g 0   trimethoprim (TRIMPEX) 100 MG tablet Take 100 mg by mouth daily.     morphine  (MS CONTIN ) 15 MG 12 hr tablet Take 1 tablet (15 mg total) by mouth every 12 (twelve) hours. 30 tablet 0   oxyCODONE  (OXY IR/ROXICODONE ) 5 MG immediate release tablet Take 1 tablet (5 mg total) by mouth every 6 (six) hours as needed for severe pain (pain score 7-10). 30 tablet 0   No current facility-administered medications for this visit.   Facility-Administered Medications Ordered in Other Visits  Medication Dose Route Frequency Provider Last Rate Last Admin   0.9 %  sodium chloride  infusion   Intravenous Continuous Sherrod Sherrod, MD   Stopped at 03/19/24 1651    SURGICAL HISTORY:  Past Surgical History:  Procedure Laterality Date   APPENDECTOMY     BRONCHIAL BIOPSY  10/20/2019   Procedure: BRONCHIAL BIOPSIES;  Surgeon: Shelah Lamar RAMAN, MD;  Location: [facility] ENDOSCOPY;  Service: Pulmonary;;   BRONCHIAL BRUSHINGS  10/20/2019   Procedure: BRONCHIAL  BRUSHINGS;  Surgeon: Shelah Lamar RAMAN, MD;  Location: [facility] ENDOSCOPY;  Service: Pulmonary;;   BRONCHIAL NEEDLE ASPIRATION BIOPSY  10/20/2019   Procedure: BRONCHIAL NEEDLE ASPIRATION BIOPSIES;  Surgeon: Shelah Lamar RAMAN, MD;  Location: [facility] ENDOSCOPY;  Service: Pulmonary;;   BRONCHIAL WASHINGS  10/20/2019   Procedure: BRONCHIAL WASHINGS;  Surgeon: Shelah Lamar RAMAN, MD;  Location: [facility] ENDOSCOPY;  Service: Pulmonary;;   CARPAL TUNNEL RELEASE Right 2011   Dr. Camella   CHOLECYSTECTOMY N/A    COLPORRHAPHY     posterior   HAND SURGERY Right 2016   Nerve surgery, Dr. Camella   IR IMAGING GUIDED PORT INSERTION  01/13/2024   IR KYPHO LUMBAR INC FX REDUCE BONE BX UNI/BIL CANNULATION INC/IMAGING  07/03/2023   OVARIAN CYST REMOVAL     RECTOCELE REPAIR  2011   w/TVH and sling   TONSILLECTOMY     TONSILLECTOMY     TOTAL KNEE ARTHROPLASTY Left 09/09/2018   Procedure: TOTAL KNEE ARTHROPLASTY, CORTISONE INJECTION RIGHT KNEE;  Surgeon: Ernie Cough, MD;  Location: [facility] ORS;  Service: Orthopedics;  Laterality: Left;  70 mins   TOTAL KNEE ARTHROPLASTY Right 04/16/2023   Procedure: TOTAL KNEE ARTHROPLASTY;  Surgeon: Ernie Cough, MD;  Location: [facility] ORS;  Service: Orthopedics;  Laterality: Right;   TOTAL VAGINAL HYSTERECTOMY  10/18/2009   rectocele repair, sling   TUBAL LIGATION Bilateral    VARICOSE VEIN SURGERY     VIDEO BRONCHOSCOPY WITH ENDOBRONCHIAL NAVIGATION N/A 10/20/2019   Procedure: VIDEO BRONCHOSCOPY WITH ENDOBRONCHIAL NAVIGATION;  Surgeon: Shelah Lamar RAMAN, MD;  Location: [facility] ENDOSCOPY;  Service: Pulmonary;  Laterality: N/A;    REVIEW OF SYSTEMS:   Review of Systems  Constitutional: Stable fatigue. Negative for appetite change, chills, fever and unexpected weight change.  HENT: Negative for mouth sores, nosebleeds, sore throat and trouble swallowing.   Eyes: Negative for eye problems and icterus.  Respiratory: Negative for hemoptysis, shortness of breath and wheezing.   Cardiovascular: Negative for  chest pain. Very mild ankle swelling.  Gastrointestinal: Mild constipation. Negative for abdominal pain, diarrhea, nausea and vomiting.  Genitourinary: Negative for bladder incontinence, difficulty urinating, dysuria, frequency and hematuria.   Musculoskeletal: Positive for low back/sacral pain. Negative for gait problem, neck pain and neck stiffness.  Skin: Positive for mild pressure sore over bony prominence on thoracic spine. Negative for itching and rash.  Neurological: Negative for dizziness, extremity weakness, gait problem, headaches, light-headedness and seizures.  Hematological: Negative for adenopathy. Does not bruise/bleed easily.  Psychiatric/Behavioral: Negative for confusion, depression and sleep disturbance. The patient is not nervous/anxious.       PHYSICAL EXAMINATION:  Blood pressure 114/67, pulse 100, temperature 98.7 F (37.1 C), temperature source Temporal, resp. rate 17, height 5' 4 (1.626 m), weight 133 lb (60.3 kg), last menstrual period 08/21/2002, SpO2 100%.  ECOG PERFORMANCE STATUS: 1-2  Physical Exam  Constitutional: Oriented to person, place, and time and appearing female and in no distress.  HENT:  Head: Normocephalic and atraumatic.  Mouth/Throat: Oropharynx is clear and moist. No oropharyngeal exudate.  Eyes: Conjunctivae are normal. Right eye exhibits no discharge. Left eye exhibits no discharge. No scleral icterus.  Neck: Normal range of motion. Neck supple.  Cardiovascular: Normal rate, regular rhythm, normal heart sounds and intact distal pulses.   Pulmonary/Chest: Effort normal and breath sounds normal. No respiratory distress. No wheezes. No rales.  Abdominal: Soft. Bowel sounds are normal. Exhibits no distension and no mass. There is no tenderness.  Musculoskeletal: Normal range of motion. Mild ankle swelling.  Lymphadenopathy:    No cervical adenopathy.  Neurological: Alert and oriented to person, place, and time. Exhibits muscle wasting.  Examined in the wheelchair.  Skin: Skin is warm and dry.  Positive for mild pressure ulcer over bony prominence on thoracic spine no signs of infection.  No rash noted. Not diaphoretic. No erythema. No pallor.  Psychiatric: Mood, memory and judgment normal.  Vitals reviewed.  LABORATORY DATA: Lab Results  Component Value Date   WBC 5.6 03/19/2024   HGB 8.2 (L) 03/19/2024   HCT 24.9 (L) 03/19/2024   MCV 100.8 (H) 03/19/2024   PLT 148 (L) 03/19/2024      Chemistry      Component Value Date/Time   NA 138 03/19/2024 1425   K 4.6 03/19/2024 1425   CL 103 03/19/2024 1425   CO2 24 03/19/2024 1425   BUN 15 03/19/2024 1425   CREATININE 0.74 03/19/2024 1425      Component Value Date/Time   CALCIUM  8.9 03/19/2024 1425   ALKPHOS 88 03/19/2024 1425   AST 32 03/19/2024 1425   ALT 37 03/19/2024 1425   BILITOT 0.2 03/19/2024 1425       RADIOGRAPHIC STUDIES:  CT Chest W Contrast Result Date: 03/19/2024 CLINICAL DATA:  Non-small cell lung cancer (NSCLC), non-metastatic, assess treatment response. * Tracking Code: BO * EXAM: CT CHEST WITH CONTRAST TECHNIQUE: Multidetector CT imaging of the chest was performed during intravenous contrast administration. RADIATION DOSE REDUCTION: This exam was performed according to the departmental dose-optimization program which includes automated exposure control, adjustment of the mA and/or kV according to patient size and/or use of iterative reconstruction technique. CONTRAST:  75mL OMNIPAQUE  IOHEXOL  300 MG/ML  SOLN COMPARISON:  CT scan chest, abdomen and pelvis from 12/22/2023. FINDINGS: Cardiovascular: Normal cardiac size. No pericardial effusion. No aortic aneurysm. There are coronary artery calcifications, in keeping with coronary artery disease. There are also mild peripheral atherosclerotic vascular calcifications of thoracic aorta and its major branches. Mediastinum/Nodes: Visualized thyroid  gland appears grossly unremarkable. Redemonstration of the  circumscribed homogeneous, fluid attenuation structure in the posterior mediastinum, on the right side of the lower esophagus measuring up to 2.5 x 3.3 cm. This is unchanged since multiple prior studies and was non FDG avid on the  prior PET-CT scan, favoring benign etiology. The esophagus is nondistended precluding optimal assessment. No axillary, mediastinal or hilar lymphadenopathy by size criteria. Lungs/Pleura: The central tracheo-bronchial tree is patent. Redemonstration of triangular opacity with bronchiectatic changes in the left lung apex (series 6, image 37), without significant interval change. There is also homogeneous opacity in the apicoposterior segment of left upper lobe (series 6, image 54), abutting the mediastinal pleura without significant interval change. There is irregular right superior hilar opacity, predominantly in the superior segment of right lower lobe which appears slightly more pronounced than the prior exam, measuring 1.4 cm in craniocaudal dimension, previously up to 1.0 cm. No new mass or consolidation. No pleural effusion or pneumothorax. There are multiple, stable, sub 4 mm, nodules throughout bilateral lungs (marked with electronic arrow sign on series 2004). No new or suspicious lung nodule. Upper Abdomen: Visualized upper abdominal viscera within normal limits. Musculoskeletal: A CT Port-a-Cath is seen in the right upper chest wall with the catheter terminating in the cavo-atrial junction region. Visualized soft tissues of the chest wall are otherwise grossly unremarkable. Redemonstration of sclerotic foci in the imaged bones (marked with electronic arrow sign on series 8). No pathological fracture. L1 kyphoplasty changes noted. There are mild multilevel degenerative changes in the visualized spine. IMPRESSION: 1. There is irregular right superior hilar opacity, predominantly in the superior segment of right lower lobe which appears slightly more pronounced than the prior exam,  measuring 1.4 cm in craniocaudal dimension, previously up to 1.0 cm. This may represent evolving postradiation changes. However, attention on follow-up examination is recommended. 2. Other triangular opacities with bronchiectatic changes in the left lung upper lobe are essentially unchanged in the interim. No new or suspicious lung nodule. No new or enlarging lymphadenopathy. 3. Stable cystic lesion in the posterior mediastinum. 4. Redemonstration of sclerotic foci in the imaged bones, compatible with metastases. No pathological fracture. Aortic Atherosclerosis (ICD10-I70.0). Electronically Signed   By: Ree Molt M.D.   On: 03/19/2024 12:16     ASSESSMENT/PLAN:  This is a very pleasant 74 years old Caucasian female recently diagnosed with a stage IIIc/IV (T3, N3, M0/M1a) non-small cell lung cancer, adenocarcinoma presented with large left upper lobe lung mass in addition to left infrahilar, right hilar and subcarinal lymphadenopathy and small left pleural effusion diagnosed in August 2021 with positive EGFR mutation in exon 21 (L858R).    The patient completed a course of concurrent chemoradiation with weekly carboplatin  and paclitaxel  status post 7 cycles.  She tolerated her treatment well except for fatigue as well as a skin burn and cough. Her scan showed mild improvement of the left upper lobe lung mass as well as the mediastinal lymphadenopathy.   The patient continued to have small left pleural effusion that is suspicious given her presentation.   She then was on Tagrisso  80 mg p.o. daily started on January 31, 2020. She is status post 42 months of treatment. Her treatment was on hold par patient request and fatigue.    She also underwent kyphoplasty of L2 vertebral lesion and the biopsy was negative for malignancy.    She then had a PET scan performed on  which showed clear evidence for disease progression especially with bone metastasis in addition to increasing activity in the left  hilar area and suspicious left axillary lymphadenopathy.    She is currently undergoing systemic chemotherapy with carboplatin  for an AUC of 5 and Alimta  500 mg/m2 in addition to resuming treatment with Tagrisso  80 mg p.o. daily.  Her dose was reduced from cycle #2 to carboplatin  for an AUC of 4 and alimta  400 mg/m2 in which she still experienced significant cytopenias and was hospitalized for C.diff.  Therefore, carboplatin  was discontinued starting from cycle #3.  She is on dose reduced Alimta  400 mg IV every 3 weeks and Tagrisso  80 mg p.o. daily.   Labs were reviewed. Recommend she proceed with cycle #6 today as scheduled.   The patient was seen with Dr. Sherrod today.  Dr. Sherrod personally and independently reviewed the scan and discussed results with the patient today.  The scan showed fairly stable disease.  Her MRI was performed at [facility] under the media tab shows stable bone lesions except for L5.  She has new sacral insufficiency fractures.  Dr. Sherrod recommends continue on Tagrisso  and Alimta .  We will rerefer her to radiation to see if Dr. Shannon feels there is any role for additional radiation to L5.  I also encouraged her to keep her appointment with orthopedics to see if there is any type of intervention that can be done for her pain.  Unclear if the pain is from the bone lesions or the new sacral insufficiency fractures.  Most of her pain is in the sacral area she does have a sacral metastatic bone lesion but it is stable  She is expected to see palliative care next week.  I did refill her morphine  and oxycodone  and review how to take this.   Early pressure ulcer of back Early skin breakdown on back, likely from prolonged sitting and pressure over the bony prominence.  - Advised frequent repositioning and use of padding when seated or reclining. - Recommended reducing direct heat exposure from heating pad. - Instructed to monitor for signs of infection and report if these  develop. - Encouraged covering the area with clothing and avoiding adhesive pads.  Anemia due to chemotherapy Declining red blood cell count likely due to chemotherapy. She remains above transfusion threshold but reports fatigue. - Planned close monitoring of hemoglobin and red blood cell count; offered to check labs next week and the following week. - Continued iron supplementation. - Monitored for symptoms of anemia, including fatigue and dyspnea.  We will see her in 3 weeks before undergoing cycle #7.     The patient was advised to call immediately if she has any concerning symptoms in the interval. The patient voices understanding of current disease status and treatment options and is in agreement with the current care plan. All questions were answered. The patient knows to call the clinic with any problems, questions or concerns. We can certainly see the patient much sooner if necessary    Orders Placed This Encounter  Procedures   Ambulatory referral to Radiation Oncology    Referral Priority:   Routine    Referral Type:   Consultation    Referral Reason:   Specialty Services Required    Requested Specialty:   Radiation Oncology    Number of Visits Requested:   1   Sample to Blood Bank    Standing Status:   Standing    Number of Occurrences:   5    Next Expected Occurrence:   03/20/2024    Expiration Date:   03/19/2025     Naysa Puskas L Moris Ratchford, PA-C 03/19/24  ADDENDUM: Hematology/Oncology Attending:  I had a face-to-face encounter with the patient today.  I reviewed prior records, lab, scan and recommended her care plan.  This is a very pleasant 74 years old  white female with metastatic non-small cell lung cancer, adenocarcinoma that was initially diagnosed as a stage IIIc in August 2021 with positive eGFR mutation admitted 5 8 Onpro.  She was treated Libtayo (Cempilimab) with a course of concurrent chemoradiation with weekly carboplatin  and paclitaxel  followed by  treatment with Tagrisso  80 mg daily status post 42 months that started in December 2021 then the patient was on break of her treatment based on her request and also secondary to adverse effects.  She had evidence for disease progression and she started treatment again with systemic chemotherapy with carboplatin , Alimta  every 3 weeks and Tagrisso  daily status post 5 cycles.  She has been on maintenance treatment with Alimta  and Tagrisso  starting from cycle #4.  The patient has been tolerating her treatment well except for the fatigue.  She continued to complain of low back pain and she had MRI of the lumbar spine performed recently and that showed increasing size of L5 lesion suspicious for worsening osseous metastasis in that area but this could be also healing effect from the chemo and targeted therapy. She has no other evidence of disease progression on the CT scan of the chest that was performed recently.  I recommended for the patient to continue with her current maintenance treatment with Alimta  and Tagrisso  and she will proceed with cycle #6 today. I will see her back for follow-up visit in 3 weeks for reevaluation before starting cycle #7. For the low back pain, we will refer her back to radiation oncology for consideration of palliative radiotherapy to the lesion 5.  She is also followed by the palliative care team for pain management. The patient was advised to call immediately if she has any other concerning symptoms in the interval. Disclaimer: This note was dictated with voice recognition software. Similar sounding words can inadvertently be transcribed and may be missed upon review. Jontavius Rabalais L Faviola Klare, PA-C

## 2024-03-18 ENCOUNTER — Ambulatory Visit (HOSPITAL_COMMUNITY)
Admission: RE | Admit: 2024-03-18 | Discharge: 2024-03-18 | Disposition: A | Discharge status: Home / Self Care | Source: Ambulatory Visit | Attending: Physician Assistant | Admitting: Physician Assistant

## 2024-03-18 DIAGNOSIS — C3490 Malignant neoplasm of unspecified part of unspecified bronchus or lung: Secondary | ICD-10-CM | POA: Diagnosis present

## 2024-03-18 MED ORDER — IOHEXOL 300 MG/ML  SOLN
75.0000 mL | Freq: Once | INTRAMUSCULAR | Status: AC | PRN
  Administered 2024-03-18: 75 mL via INTRAVENOUS

## 2024-03-18 MED ORDER — HEPARIN SOD (PORK) LOCK FLUSH 100 UNIT/ML IV SOLN
INTRAVENOUS | Status: AC
  Filled 2024-03-18: qty 5

## 2024-03-18 MED ORDER — HEPARIN SOD (PORK) LOCK FLUSH 100 UNIT/ML IV SOLN
500.0000 [IU] | Freq: Once | INTRAVENOUS | Status: AC
  Administered 2024-03-18: 500 [IU] via INTRAVENOUS

## 2024-03-19 ENCOUNTER — Inpatient Hospital Stay

## 2024-03-19 ENCOUNTER — Inpatient Hospital Stay: Admitting: Dietician

## 2024-03-19 ENCOUNTER — Inpatient Hospital Stay: Admitting: Physician Assistant

## 2024-03-19 DIAGNOSIS — M549 Dorsalgia, unspecified: Secondary | ICD-10-CM | POA: Diagnosis not present

## 2024-03-19 DIAGNOSIS — C3492 Malignant neoplasm of unspecified part of left bronchus or lung: Secondary | ICD-10-CM

## 2024-03-19 DIAGNOSIS — C3490 Malignant neoplasm of unspecified part of unspecified bronchus or lung: Secondary | ICD-10-CM

## 2024-03-19 DIAGNOSIS — Z5111 Encounter for antineoplastic chemotherapy: Secondary | ICD-10-CM | POA: Diagnosis not present

## 2024-03-19 LAB — CMP (CANCER CENTER ONLY)
ALT: 37 U/L (ref 0–44)
AST: 32 U/L (ref 15–41)
Albumin: 3.8 g/dL (ref 3.5–5.0)
Alkaline Phosphatase: 88 U/L (ref 38–126)
Anion gap: 11 (ref 5–15)
BUN: 15 mg/dL (ref 8–23)
CO2: 24 mmol/L (ref 22–32)
Calcium: 8.9 mg/dL (ref 8.9–10.3)
Chloride: 103 mmol/L (ref 98–111)
Creatinine: 0.74 mg/dL (ref 0.44–1.00)
GFR, Estimated: >60 mL/min
Glucose, Bld: 154 mg/dL — ABNORMAL HIGH (ref 70–99)
Potassium: 4.6 mmol/L (ref 3.5–5.1)
Sodium: 138 mmol/L (ref 135–145)
Total Bilirubin: 0.2 mg/dL (ref 0.0–1.2)
Total Protein: 5.8 g/dL — ABNORMAL LOW (ref 6.5–8.1)

## 2024-03-19 LAB — CBC WITH DIFFERENTIAL (CANCER CENTER ONLY)
Abs Immature Granulocytes: 0.05 10*3/uL (ref 0.00–0.07)
Basophils Absolute: 0 10*3/uL (ref 0.0–0.1)
Basophils Relative: 0 %
Eosinophils Absolute: 0 10*3/uL (ref 0.0–0.5)
Eosinophils Relative: 0 %
HCT: 24.9 % — ABNORMAL LOW (ref 36.0–46.0)
Hemoglobin: 8.2 g/dL — ABNORMAL LOW (ref 12.0–15.0)
Immature Granulocytes: 1 %
Lymphocytes Relative: 2 %
Lymphs Abs: 0.1 10*3/uL — ABNORMAL LOW (ref 0.7–4.0)
MCH: 33.2 pg (ref 26.0–34.0)
MCHC: 32.9 g/dL (ref 30.0–36.0)
MCV: 100.8 fL — ABNORMAL HIGH (ref 80.0–100.0)
Monocytes Absolute: 0.2 10*3/uL (ref 0.1–1.0)
Monocytes Relative: 3 %
Neutro Abs: 5.2 10*3/uL (ref 1.7–7.7)
Neutrophils Relative %: 94 %
Platelet Count: 148 10*3/uL — ABNORMAL LOW (ref 150–400)
RBC: 2.47 MIL/uL — ABNORMAL LOW (ref 3.87–5.11)
RDW: 17.9 % — ABNORMAL HIGH (ref 11.5–15.5)
WBC Count: 5.6 10*3/uL (ref 4.0–10.5)
nRBC: 0 % (ref 0.0–0.2)

## 2024-03-19 LAB — SAMPLE TO BLOOD BANK

## 2024-03-19 MED ORDER — CYANOCOBALAMIN 1000 MCG/ML IJ SOLN
1000.0000 ug | Freq: Once | INTRAMUSCULAR | Status: AC
  Administered 2024-03-19: 1000 ug via INTRAMUSCULAR
  Filled 2024-03-19: qty 1

## 2024-03-19 MED ORDER — PROCHLORPERAZINE MALEATE 10 MG PO TABS
10.0000 mg | ORAL_TABLET | Freq: Once | ORAL | Status: AC
  Administered 2024-03-19: 10 mg via ORAL
  Filled 2024-03-19: qty 1

## 2024-03-19 MED ORDER — SODIUM CHLORIDE 0.9 % IV SOLN: INTRAVENOUS | Status: DC

## 2024-03-19 MED ORDER — MORPHINE SULFATE ER 15 MG PO TBCR: 15.0000 mg | EXTENDED_RELEASE_TABLET | Freq: Two times a day (BID) | ORAL | 0 refills | Status: AC

## 2024-03-19 MED ORDER — OXYCODONE HCL 5 MG PO TABS: 5.0000 mg | ORAL_TABLET | Freq: Four times a day (QID) | ORAL | 0 refills | Status: AC | PRN

## 2024-03-19 MED ORDER — SODIUM CHLORIDE 0.9 % IV SOLN
400.0000 mg/m2 | Freq: Once | INTRAVENOUS | Status: AC
  Administered 2024-03-19: 600 mg via INTRAVENOUS
  Filled 2024-03-19: qty 20

## 2024-03-19 NOTE — Patient Instructions (Signed)
 [facility] - A DEPT OF [facility]  Discharge Instructions: Thank you for choosing [facility] to provide your oncology and hematology care.   If you have a lab appointment with the Cancer Center, please go directly to the Cancer Center and check in at the registration area.   Wear comfortable clothing and clothing appropriate for easy access to any Portacath or PICC line.   We strive to give you quality time with your provider. You may need to reschedule your appointment if you arrive late (15 or more minutes).  Arriving late affects you and other patients whose appointments are after yours.  Also, if you miss three or more appointments without notifying the office, you may be dismissed from the clinic at the provider's discretion.      For prescription refill requests, have your pharmacy contact our office and allow 72 hours for refills to be completed.    Today you received the following chemotherapy and/or immunotherapy agents Alimta      To help prevent nausea and vomiting after your treatment, we encourage you to take your nausea medication as directed.  BELOW ARE SYMPTOMS THAT SHOULD BE REPORTED IMMEDIATELY: *FEVER GREATER THAN 100.4 F (38 C) OR HIGHER *CHILLS OR SWEATING *NAUSEA AND VOMITING THAT IS NOT CONTROLLED WITH YOUR NAUSEA MEDICATION *UNUSUAL SHORTNESS OF BREATH *UNUSUAL BRUISING OR BLEEDING *URINARY PROBLEMS (pain or burning when urinating, or frequent urination) *BOWEL PROBLEMS (unusual diarrhea, constipation, pain near the anus) TENDERNESS IN MOUTH AND THROAT WITH OR WITHOUT PRESENCE OF ULCERS (sore throat, sores in mouth, or a toothache) UNUSUAL RASH, SWELLING OR PAIN  UNUSUAL VAGINAL DISCHARGE OR ITCHING   Items with * indicate a potential emergency and should be followed up as soon as possible or go to the Emergency Department if any problems should occur.  Please show the CHEMOTHERAPY ALERT CARD or IMMUNOTHERAPY  ALERT CARD at check-in to the Emergency Department and triage nurse.  Should you have questions after your visit or need to cancel or reschedule your appointment, please contact [facility] - A DEPT OF [facility]  Dept: [phone]  and follow the prompts.  Office hours are 8:00 a.m. to 4:30 p.m. Monday - Friday. Please note that voicemails left after 4:00 p.m. may not be returned until the following business day.  We are closed weekends and major holidays. You have access to a nurse at all times for urgent questions. Please call the main number to the clinic Dept: [phone] and follow the prompts.   For any non-urgent questions, you may also contact your provider using MyChart. We now offer e-Visits for anyone 79 and older to request care online for non-urgent symptoms. For details visit [email or web address].   Also download the MyChart app! Go to the app store, search "MyChart", open the app, select [place], and log in with your MyChart username and password.

## 2024-03-19 NOTE — Patient Instructions (Signed)
-   Was nice seeing you today.  During your appointment we discussed your low back pain. -I did place a referral to radiation oncology.  I did receive a message from the scheduler after you left.  She did confirm that she received the referral and she was going to mark it as urgent.  We will see/get Dr. Colton opinion whether or not he thinks that radiation would be helpful to the L5 vertebrae.  - Please try and call [facility] to move up your appointment to see if any other intervention could help with your pain.  In the meantime you are expected to establish care with palliative care next week which are the pain experts and they may better help adjust your medications -The morphine  is to take every 12 hours.  You can take oxycodone  every 6 hours as needed for breakthrough pain. --ANEMIA DUE TO CHEMOTHERAPY: Your red blood cell count is declining due to chemotherapy, causing fatigue. We will closely monitor your hemoglobin and red blood cell count, and you should continue your iron supplementation. We will check your labs next week and the following week to monitor for symptoms of anemia. --EARLY PRESSURE ULCER OF BACK: You have early skin breakdown on your back, likely from prolonged sitting and pressure.  -We recommend frequent repositioning, using padding when seated or reclining, reducing direct heat exposure, and monitoring for signs of infection.

## 2024-03-19 NOTE — Progress Notes (Signed)
 Nutrition Follow-up:  Pt with recurrent NSCLC (diagnosed 2021). S/p concurrent chemoRT followed by 42 months Tagrisso . She is currently receiving carboplatin  + alimta  + tagrisso . Carboplatin  removed starting cycle 3 due to cytopenias.   Met with patient in infusion. She is doing well today. Appetite is good. Patient eating small frequent meals and including good sources of protein. She reports aversion to some meats. Does not care for texture or taste. Sometimes can eat half of hamburger and likes hotdogs. Patient drinking one premier in the morning. She denies NIS at this time.     Medications: reviewed   Labs: glucose 154, Hgb 8.2  Anthropometrics: Wt 133 lb today - trending up  1/8 - 131 lb  12/3 - 122 lb  11/24 - 119 lb 4 oz   NUTRITION DIAGNOSIS: Unintended wt loss - improving; Severe malnutrition improving    INTERVENTION:  Continue eating small frequent meals  Continue daily premier protein    MONITORING, EVALUATION, GOAL: wt trends, intake   NEXT VISIT: To be scheduled as needed

## 2024-03-20 ENCOUNTER — Telehealth: Payer: Self-pay | Admitting: Radiation Oncology

## 2024-03-20 NOTE — Telephone Encounter (Signed)
 1/30 @ 8:53 am Patient not available at this time currently at doctor's appt, will try back later.

## 2024-03-26 ENCOUNTER — Other Ambulatory Visit: Payer: Self-pay | Admitting: Physician Assistant

## 2024-03-26 ENCOUNTER — Inpatient Hospital Stay

## 2024-03-26 ENCOUNTER — Telehealth: Payer: Self-pay

## 2024-03-26 ENCOUNTER — Inpatient Hospital Stay: Attending: Internal Medicine

## 2024-03-26 ENCOUNTER — Telehealth: Payer: Self-pay | Admitting: Physician Assistant

## 2024-03-26 ENCOUNTER — Encounter: Payer: Self-pay | Admitting: Nurse Practitioner

## 2024-03-26 VITALS — BP 94/64 | HR 106 | Temp 97.9°F | Resp 15 | Ht 64.0 in

## 2024-03-26 DIAGNOSIS — D6481 Anemia due to antineoplastic chemotherapy: Secondary | ICD-10-CM

## 2024-03-26 DIAGNOSIS — Z7189 Other specified counseling: Secondary | ICD-10-CM

## 2024-03-26 DIAGNOSIS — C3490 Malignant neoplasm of unspecified part of unspecified bronchus or lung: Secondary | ICD-10-CM

## 2024-03-26 DIAGNOSIS — G893 Neoplasm related pain (acute) (chronic): Secondary | ICD-10-CM

## 2024-03-26 DIAGNOSIS — R530 Neoplastic (malignant) related fatigue: Secondary | ICD-10-CM

## 2024-03-26 DIAGNOSIS — Z515 Encounter for palliative care: Secondary | ICD-10-CM

## 2024-03-26 DIAGNOSIS — K5903 Drug induced constipation: Secondary | ICD-10-CM

## 2024-03-26 LAB — CMP (CANCER CENTER ONLY)
ALT: 25 U/L (ref 0–44)
AST: 31 U/L (ref 15–41)
Albumin: 3.6 g/dL (ref 3.5–5.0)
Alkaline Phosphatase: 88 U/L (ref 38–126)
Anion gap: 11 (ref 5–15)
BUN: 20 mg/dL (ref 8–23)
CO2: 26 mmol/L (ref 22–32)
Calcium: 8.8 mg/dL — ABNORMAL LOW (ref 8.9–10.3)
Chloride: 101 mmol/L (ref 98–111)
Creatinine: 0.78 mg/dL (ref 0.44–1.00)
GFR, Estimated: >60 mL/min
Glucose, Bld: 111 mg/dL — ABNORMAL HIGH (ref 70–99)
Potassium: 4.4 mmol/L (ref 3.5–5.1)
Sodium: 137 mmol/L (ref 135–145)
Total Bilirubin: 0.2 mg/dL (ref 0.0–1.2)
Total Protein: 5.7 g/dL — ABNORMAL LOW (ref 6.5–8.1)

## 2024-03-26 LAB — PREPARE RBC (CROSSMATCH)

## 2024-03-26 LAB — CBC WITH DIFFERENTIAL (CANCER CENTER ONLY)
Abs Immature Granulocytes: 0.02 10*3/uL (ref 0.00–0.07)
Basophils Absolute: 0 10*3/uL (ref 0.0–0.1)
Basophils Relative: 0 %
Eosinophils Absolute: 0.1 10*3/uL (ref 0.0–0.5)
Eosinophils Relative: 4 %
HCT: 22.8 % — ABNORMAL LOW (ref 36.0–46.0)
Hemoglobin: 7.6 g/dL — ABNORMAL LOW (ref 12.0–15.0)
Immature Granulocytes: 1 %
Lymphocytes Relative: 11 %
Lymphs Abs: 0.2 10*3/uL — ABNORMAL LOW (ref 0.7–4.0)
MCH: 33.5 pg (ref 26.0–34.0)
MCHC: 33.3 g/dL (ref 30.0–36.0)
MCV: 100.4 fL — ABNORMAL HIGH (ref 80.0–100.0)
Monocytes Absolute: 0.4 10*3/uL (ref 0.1–1.0)
Monocytes Relative: 18 %
Neutro Abs: 1.4 10*3/uL — ABNORMAL LOW (ref 1.7–7.7)
Neutrophils Relative %: 66 %
Platelet Count: 110 10*3/uL — ABNORMAL LOW (ref 150–400)
RBC: 2.27 MIL/uL — ABNORMAL LOW (ref 3.87–5.11)
RDW: 17.2 % — ABNORMAL HIGH (ref 11.5–15.5)
WBC Count: 2.1 10*3/uL — ABNORMAL LOW (ref 4.0–10.5)
nRBC: 0 % (ref 0.0–0.2)

## 2024-03-26 LAB — SAMPLE TO BLOOD BANK

## 2024-03-26 NOTE — Patient Instructions (Signed)
 VISIT SUMMARY:  During your visit, we discussed your severe back pain and its impact on your daily activities. We reviewed your current pain management regimen and made adjustments to improve your pain relief. We also addressed your constipation issues related to opioid use and coordinated your care with multiple specialists to ensure comprehensive management of your symptoms.  YOUR PLAN:  -NEOPLASM-RELATED PAIN: Neoplasm-related pain is pain associated with cancer or its treatment. Your back pain is severe and not well-controlled with your current medications. We will continue your extended-release morphine  every 12 hours and increase your oxycodone  to every 4-6 hours as needed for breakthrough pain. Please avoid mixing oxycodone  and hydrocodone . We will follow up with orthopedic and radiation oncology consultations and consider a TENS unit after these consultations.  -OPIOID-INDUCED CONSTIPATION: Opioid-induced constipation is a common side effect of opioid medications, causing difficulty in bowel movements. Your current management with Miralax  is not effective, so we will increase Miralax  to twice daily and consider using stool softeners like Colace as needed.  -PALLIATIVE CARE MANAGEMENT: Palliative care focuses on improving quality of life and managing symptoms for patients with serious illnesses. We are coordinating with multiple specialists to optimize your pain management and address your constipation. We have arranged for medication refills, scheduled follow-up appointments with your neurologist and orthopedic specialist, and planned for lab work and port flush at [facility] to reduce your travel burden.  INSTRUCTIONS:  Please follow up with your orthopedic specialist on March 9th and attend your brain MRI on February 14th. We will have a phone follow-up on February 12th to assess the effectiveness of your pain management. Continue taking your medications as prescribed and avoid  mixing oxycodone  and hydrocodone . Increase Miralax  to twice daily and consider using stool softeners like Colace if needed. We will continue to coordinate your care with your specialists to ensure comprehensive management of your symptoms.

## 2024-03-26 NOTE — Telephone Encounter (Signed)
 Spoke directly with patient and confirmed blood transfusion date, time and location with her.

## 2024-03-26 NOTE — Progress Notes (Signed)
 "    Palliative Medicine [facility]  [phone] [phone]   Name: Charliee Krenz Date: 03/26/2024 MRN: [id number]  DOB: January 03, 1951  Patient Care Team: Sheryle Carwin, MD as PCP - General (Internal Medicine) Dann Candyce RAMAN, MD as PCP - Cardiology (Cardiology) Evertt Lonell BROCKS, RN as Oncology Nurse Navigator    REASON FOR CONSULTATION: Linzee Depaul is a 74 y.o. female with oncologic medical history including recurrent metastatic non-small cell lung cancer (09/2019) s/p systemic chemotherapy recently on Alimta  and oral Tagrisso .  Palliative is seeing patient for symptom management and goals of care.    SOCIAL HISTORY:     reports that she has never smoked. She has never used smokeless tobacco. She reports that she does not currently use alcohol after a past usage of about 1.0 standard drink of alcohol per week. She reports that she does not use drugs.  ADVANCE DIRECTIVES:  None on file   CODE STATUS: Full code  PAST MEDICAL HISTORY: Past Medical History:  Diagnosis Date   Anemia    as a child   Anxiety    Asthma 10/19/2020   Bursitis    Chronic reflux esophagitis    Complication of anesthesia    Diarrhea, functional    Diverticulosis    GERD (gastroesophageal reflux disease)    History of kidney stones    History of radiation therapy    Lumbar Spine-12/02/23-12/16/23- Dr. Lynwood Nasuti   Hypertension    Knee pain    right knee-seeing ortho   lung ca 09/2019   Osteoarthritis    Plantar fasciitis    Pneumonia    PONV (postoperative nausea and vomiting)    has used the patch before and that helps   Pure hypercholesterolemia    RLS (restless legs syndrome)    Superficial vein thrombosis    Thyroid  disease    hypothyroid    PAST SURGICAL HISTORY:  Past Surgical History:  Procedure Laterality Date   APPENDECTOMY     BRONCHIAL BIOPSY  10/20/2019   Procedure: BRONCHIAL BIOPSIES;  Surgeon: Shelah Lamar RAMAN, MD;   Location: [facility];  Service: Pulmonary;;   BRONCHIAL BRUSHINGS  10/20/2019   Procedure: BRONCHIAL BRUSHINGS;  Surgeon: Shelah Lamar RAMAN, MD;  Location: [facility];  Service: Pulmonary;;   BRONCHIAL NEEDLE ASPIRATION BIOPSY  10/20/2019   Procedure: BRONCHIAL NEEDLE ASPIRATION BIOPSIES;  Surgeon: Shelah Lamar RAMAN, MD;  Location: [facility];  Service: Pulmonary;;   BRONCHIAL WASHINGS  10/20/2019   Procedure: BRONCHIAL WASHINGS;  Surgeon: Shelah Lamar RAMAN, MD;  Location: [facility];  Service: Pulmonary;;   CARPAL TUNNEL RELEASE Right 2011   Dr. Camella   CHOLECYSTECTOMY N/A    COLPORRHAPHY     posterior   HAND SURGERY Right 2016   Nerve surgery, Dr. Camella   IR IMAGING GUIDED PORT INSERTION  01/13/2024   IR KYPHO LUMBAR INC FX REDUCE BONE BX UNI/BIL CANNULATION INC/IMAGING  07/03/2023   OVARIAN CYST REMOVAL     RECTOCELE REPAIR  2011   w/TVH and sling   TONSILLECTOMY     TONSILLECTOMY     TOTAL KNEE ARTHROPLASTY Left 09/09/2018   Procedure: TOTAL KNEE ARTHROPLASTY, CORTISONE INJECTION RIGHT KNEE;  Surgeon: Ernie Cough, MD;  Location: [facility];  Service: Orthopedics;  Laterality: Left;  70 mins   TOTAL KNEE ARTHROPLASTY Right 04/16/2023   Procedure: TOTAL KNEE ARTHROPLASTY;  Surgeon: Ernie Cough, MD;  Location: [facility];  Service: Orthopedics;  Laterality: Right;   TOTAL VAGINAL  HYSTERECTOMY  10/18/2009   rectocele repair, sling   TUBAL LIGATION Bilateral    VARICOSE VEIN SURGERY     VIDEO BRONCHOSCOPY WITH ENDOBRONCHIAL NAVIGATION N/A 10/20/2019   Procedure: VIDEO BRONCHOSCOPY WITH ENDOBRONCHIAL NAVIGATION;  Surgeon: Shelah Lamar RAMAN, MD;  Location: [facility];  Service: Pulmonary;  Laterality: N/A;    HEMATOLOGY/ONCOLOGY HISTORY:  Oncology History  Adenocarcinoma of left lung, stage 3 (HCC)  11/03/2019 Initial Diagnosis   Adenocarcinoma of left lung, stage 3 (HCC)   11/16/2019 - 01/04/2020 Chemotherapy   The patient had palonosetron  (ALOXI ) injection 0.25 mg, 0.25 mg,  Intravenous,  Once, 8 of 8 cycles Administration: 0.25 mg (11/16/2019), 0.25 mg (12/14/2019), 0.25 mg (12/21/2019), 0.25 mg (11/23/2019), 0.25 mg (12/29/2019), 0.25 mg (12/01/2019), 0.25 mg (12/07/2019), 0.25 mg (01/04/2020) CARBOplatin  (PARAPLATIN ) 190 mg in sodium chloride  0.9 % 250 mL chemo infusion, 190 mg (100 % of original dose 186.6 mg), Intravenous,  Once, 8 of 8 cycles Dose modification: 186.6 mg (original dose 186.6 mg, Cycle 1) Administration: 190 mg (11/16/2019), 190 mg (12/14/2019), 190 mg (12/21/2019), 190 mg (11/23/2019), 190 mg (12/29/2019), 190 mg (12/01/2019), 190 mg (12/07/2019), 190 mg (01/04/2020) PACLitaxel  (TAXOL ) 84 mg in sodium chloride  0.9 % 250 mL chemo infusion (</= 80mg /m2), 45 mg/m2 = 84 mg, Intravenous,  Once, 8 of 8 cycles Administration: 84 mg (11/16/2019), 84 mg (12/14/2019), 84 mg (12/21/2019), 84 mg (11/23/2019), 84 mg (12/29/2019), 84 mg (12/01/2019), 84 mg (12/07/2019), 84 mg (01/04/2020)  for chemotherapy treatment.    02/02/2020 - 02/02/2020 Chemotherapy   Patient is on Treatment Plan : LUNG NSCLC Osimertinib  q28d     04/13/2020 Cancer Staging   Staging form: Lung, AJCC 8th Edition - Clinical: Stage IVB (cT3, cN3, pM1c) - Signed by Sherrod Sherrod, MD on 11/28/2023   11/21/2023 -  Chemotherapy   Patient is on Treatment Plan : LUNG NSCLC Osimertinib  + Pemetrexed  + Carboplatin  q21d x 4 cycles / Osimertinib  + Pemetrexed  q21d       ALLERGIES:  has no known allergies.  MEDICATIONS:  Current Outpatient Medications  Medication Sig Dispense Refill   albuterol  (VENTOLIN  HFA) 108 (90 Base) MCG/ACT inhaler Inhale 2 puffs into the lungs every 6 (six) hours as needed for wheezing or shortness of breath. 8.5 g 0   cyanocobalamin  (VITAMIN B12) 1000 MCG tablet Take 1 tablet (1,000 mcg total) by mouth daily. 30 tablet 1   diphenoxylate -atropine  (LOMOTIL ) 2.5-0.025 MG tablet Take 1 tablet by mouth 4 (four) times daily as needed for diarrhea or loose stools. 30 tablet 0    famotidine  (PEPCID ) 40 MG tablet Take 40 mg by mouth at bedtime.     feeding supplement (ENSURE PLUS HIGH PROTEIN) LIQD Take 237 mLs by mouth 2 (two) times daily between meals. 14220 mL 1   ferrous sulfate  325 (65 FE) MG tablet Take 1 tablet (325 mg total) by mouth daily with breakfast. 30 tablet 1   fexofenadine (ALLEGRA) 180 MG tablet Take 180 mg by mouth daily as needed for allergies.     fluticasone  (FLONASE ) 50 MCG/ACT nasal spray Place 1 spray into both nostrils as needed for allergies.     folic acid  (FOLVITE ) 1 MG tablet Take 1 tablet (1 mg total) by mouth daily. Start 7 days before pemetrexed  chemotherapy. Continue until 21 days after pemetrexed  completed. 100 tablet 3   levothyroxine  (SYNTHROID ) 100 MCG tablet Take 1 tablet (100 mcg total) by mouth daily. One po qd 90 tablet 0   lidocaine  (LIDODERM ) 5 % Place 1 patch onto  the skin daily as needed (back pain).     LORazepam  (ATIVAN ) 0.5 MG tablet Take 0.5 mg by mouth in the morning and at bedtime.     magnesium  oxide (MAG-OX) 400 MG tablet Take 1 tablet (400 mg total) by mouth daily. 30 tablet 0   morphine  (MS CONTIN ) 15 MG 12 hr tablet Take 1 tablet (15 mg total) by mouth every 12 (twelve) hours. 30 tablet 0   Multiple Vitamin (MULTIVITAMIN WITH MINERALS) TABS tablet Take 1 tablet by mouth daily. 90 tablet 1   MYRBETRIQ  50 MG TB24 tablet Take 50 mg by mouth daily.     ondansetron  (ZOFRAN -ODT) 4 MG disintegrating tablet Take 4-8 mg by mouth every 6 (six) hours as needed.     osimertinib  mesylate (TAGRISSO ) 80 MG tablet Take 1 tablet (80 mg total) by mouth daily. 30 tablet 3   oxyCODONE  (OXY IR/ROXICODONE ) 5 MG immediate release tablet Take 1 tablet (5 mg total) by mouth every 6 (six) hours as needed for severe pain (pain score 7-10). 30 tablet 0   potassium chloride  SA (KLOR-CON  M) 20 MEQ tablet Take 1 tablet (20 mEq total) by mouth daily for 10 days. 10 tablet 0   PREMARIN vaginal cream Place 1 applicator vaginally as needed (urinary  issues).     prochlorperazine  (COMPAZINE ) 10 MG tablet Take 1 tablet (10 mg total) by mouth every 6 (six) hours as needed for nausea or vomiting. 30 tablet 1   RABEprazole  (ACIPHEX ) 20 MG tablet Take 1 tablet (20 mg total) by mouth 2 (two) times a week.     rosuvastatin  (CRESTOR ) 5 MG tablet Take 1 tablet (5 mg total) by mouth daily. 90 tablet 3   STIOLTO RESPIMAT  2.5-2.5 MCG/ACT AERS INHALE TWO PUFFS INTO THE LUNGS DAILY 4 g 5   triamcinolone  cream (KENALOG ) 0.1 % Apply 1 Application topically 2 (two) times daily. As needed for itching/rash (Patient taking differently: Apply 1 Application topically as needed (As needed for itching/rash). As needed for itching/rash) 453.6 g 0   trimethoprim (TRIMPEX) 100 MG tablet Take 100 mg by mouth daily.     No current facility-administered medications for this visit.    VITAL SIGNS: BP 94/64 (BP Location: Left Arm, Patient Position: Sitting)   Pulse (!) 106   Temp 97.9 F (36.6 C) (Temporal)   Resp 15   Ht 5' 4 (1.626 m)   LMP 08/21/2002   SpO2 100%   BMI 22.83 kg/m  There were no vitals filed for this visit.  Estimated body mass index is 22.83 kg/m as calculated from the following:   Height as of this encounter: 5' 4 (1.626 m).   Weight as of 03/19/24: 133 lb (60.3 kg).  LABS: CBC:    Component Value Date/Time   WBC 5.6 03/19/2024 1425   WBC 1.9 (L) 12/27/2023 0434   HGB 8.2 (L) 03/19/2024 1425   HGB 13.5 05/21/2013 1526   HCT 24.9 (L) 03/19/2024 1425   PLT 148 (L) 03/19/2024 1425   MCV 100.8 (H) 03/19/2024 1425   NEUTROABS 5.2 03/19/2024 1425   LYMPHSABS 0.1 (L) 03/19/2024 1425   MONOABS 0.2 03/19/2024 1425   EOSABS 0.0 03/19/2024 1425   BASOSABS 0.0 03/19/2024 1425   Comprehensive Metabolic Panel:    Component Value Date/Time   NA 138 03/19/2024 1425   K 4.6 03/19/2024 1425   CL 103 03/19/2024 1425   CO2 24 03/19/2024 1425   BUN 15 03/19/2024 1425   CREATININE 0.74 03/19/2024 1425  GLUCOSE 154 (H) 03/19/2024 1425    CALCIUM  8.9 03/19/2024 1425   AST 32 03/19/2024 1425   ALT 37 03/19/2024 1425   ALKPHOS 88 03/19/2024 1425   BILITOT 0.2 03/19/2024 1425   PROT 5.8 (L) 03/19/2024 1425   ALBUMIN 3.8 03/19/2024 1425    RADIOGRAPHIC STUDIES: CT Chest W Contrast Result Date: 03/19/2024 CLINICAL DATA:  Non-small cell lung cancer (NSCLC), non-metastatic, assess treatment response. * Tracking Code: BO * EXAM: CT CHEST WITH CONTRAST TECHNIQUE: Multidetector CT imaging of the chest was performed during intravenous contrast administration. RADIATION DOSE REDUCTION: This exam was performed according to the departmental dose-optimization program which includes automated exposure control, adjustment of the mA and/or kV according to patient size and/or use of iterative reconstruction technique. CONTRAST:  75mL OMNIPAQUE  IOHEXOL  300 MG/ML  SOLN COMPARISON:  CT scan chest, abdomen and pelvis from 12/22/2023. FINDINGS: Cardiovascular: Normal cardiac size. No pericardial effusion. No aortic aneurysm. There are coronary artery calcifications, in keeping with coronary artery disease. There are also mild peripheral atherosclerotic vascular calcifications of thoracic aorta and its major branches. Mediastinum/Nodes: Visualized thyroid  gland appears grossly unremarkable. Redemonstration of the circumscribed homogeneous, fluid attenuation structure in the posterior mediastinum, on the right side of the lower esophagus measuring up to 2.5 x 3.3 cm. This is unchanged since multiple prior studies and was non FDG avid on the prior PET-CT scan, favoring benign etiology. The esophagus is nondistended precluding optimal assessment. No axillary, mediastinal or hilar lymphadenopathy by size criteria. Lungs/Pleura: The central tracheo-bronchial tree is patent. Redemonstration of triangular opacity with bronchiectatic changes in the left lung apex (series 6, image 37), without significant interval change. There is also homogeneous opacity in the  apicoposterior segment of left upper lobe (series 6, image 54), abutting the mediastinal pleura without significant interval change. There is irregular right superior hilar opacity, predominantly in the superior segment of right lower lobe which appears slightly more pronounced than the prior exam, measuring 1.4 cm in craniocaudal dimension, previously up to 1.0 cm. No new mass or consolidation. No pleural effusion or pneumothorax. There are multiple, stable, sub 4 mm, nodules throughout bilateral lungs (marked with electronic arrow sign on series 2004). No new or suspicious lung nodule. Upper Abdomen: Visualized upper abdominal viscera within normal limits. Musculoskeletal: A CT Port-a-Cath is seen in the right upper chest wall with the catheter terminating in the cavo-atrial junction region. Visualized soft tissues of the chest wall are otherwise grossly unremarkable. Redemonstration of sclerotic foci in the imaged bones (marked with electronic arrow sign on series 8). No pathological fracture. L1 kyphoplasty changes noted. There are mild multilevel degenerative changes in the visualized spine. IMPRESSION: 1. There is irregular right superior hilar opacity, predominantly in the superior segment of right lower lobe which appears slightly more pronounced than the prior exam, measuring 1.4 cm in craniocaudal dimension, previously up to 1.0 cm. This may represent evolving postradiation changes. However, attention on follow-up examination is recommended. 2. Other triangular opacities with bronchiectatic changes in the left lung upper lobe are essentially unchanged in the interim. No new or suspicious lung nodule. No new or enlarging lymphadenopathy. 3. Stable cystic lesion in the posterior mediastinum. 4. Redemonstration of sclerotic foci in the imaged bones, compatible with metastases. No pathological fracture. Aortic Atherosclerosis (ICD10-I70.0). Electronically Signed   By: Ree Molt M.D.   On: 03/19/2024 12:16     PERFORMANCE STATUS (ECOG) : 1 - Symptomatic but completely ambulatory  Review of Systems  Constitutional:  Positive for activity change,  appetite change and fatigue.  Musculoskeletal:  Positive for back pain.  Unless otherwise noted, a complete review of systems is negative.  Physical Exam General: NAD Cardiovascular: regular rate and rhythm Pulmonary: clear ant fields Abdomen: soft, nontender, + bowel sounds Extremities: no edema, no joint deformities Skin: no rashes Neurological: Alert and oriented x3  Discussed the use of AI scribe software for clinical note transcription with the patient, who gave verbal consent to proceed.  History of Present Illness Versia Mignogna is a 74 year old female with metastatic NSCLC cancer who presents to clinic for her initial palliative visit for symptom management. No acute distress. She is accompanied by her caregiver, Melissa. Patient is alert and can engage in discussions appropriately.   I introduced myself, Maygan RN, and Palliative's role in collaboration with the oncology team. Concept of Palliative Care was introduced as specialized medical care for people and their families living with serious illness.  It focuses on providing relief from the symptoms and stress of a serious illness.  The goal is to improve quality of life for both the patient and the family. Values and goals of care important to patient and family were attempted to be elicited.  Mrs. Baptista lives in the home with her husband of more than 55 years. They have 3 children. 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 Her husband is a Pharm,D.   Her caregiver Eleanor comes in daily to assist with ADLs. She sleeps well and can sit comfortably on the sofa, but experiences significant pain when attempting to walk, even with a walker. Her appetite has been good recently, and she  weighs 133 pounds.  She also experiences constipation, for which she takes Miralax  and occasionally uses suppositories. Despite these measures, she feels bloated and believes this contributes to her back pain. Education provided on the importance of bowel regimen in the setting of opioid use. I have recommended patient begin taking Miralax  twice daily. She verbalized understanding.   She experiences severe back pain that is debilitating, preventing her from walking or performing daily activities at times. The pain is described as intense and constant. Lidocaine  patches and a heating pad offer minimal benefit. Her current medication regimen includes extended-release morphine  every twelve hours, oxycodone  twice a day. The oxycodone  takes about 45 minutes to work and can reduce her pain from a 12 to an 8 on a scale of 0 to 10. Patient states she has not noticed much of a difference since starting extended release morphine . Education provided on the use of extended release in addition to breakthrough medication. She and caregiver verbalized understanding and appreciation.   Complete medication, chart, and social history review completed. Patient denies any alcohol or illicit drug use. Extensive discussions and explanation of palliative's role in collaboration with his oncology team to assist in patient's pain and symptom management. Education provided on criteria for ongoing pain support via palliative team.   Mrs. Cid is aware realistically we cannot make her pain go completely away. Our win will be to get pain more manageable improving her quality of life. She expressed appreciation. Patient has been advised to continue MS Contin  15mg  every 12  hours. She has only been taking oxycodone  1-2 times daily despite expressing severe uncontrolled pain. She misunderstood instructions for use. Patient is aware she can take oxycodone  5mg  every 4-6 hours as needed for breakthrough pain. We will not make dose  adjustments at this time to maximize at lowest dose. We will continue to closely monitor and support.    Goals of Care We discussed Mrs. Faulds's current illness and what it means in the larger context of her on-going co-morbidities. Natural disease trajectory and expectations were discussed.  Patient is realistic in her understanding of current cancer diagnosis. She is remaining hopeful for stability. Goals are clear to continue to treat the treatable allowing every opportunity to thrive.   I discussed the importance of continued conversation with family and their medical providers regarding overall plan of care and treatment options, ensuring decisions are within the context of the patients values and GOCs. Assessment & Plan Established therapeutic relationship. Education provided on palliative's role in collaboration with their Oncology/Radiation team.  Neoplasm-related pain Severe back pain, rated 12/10, exacerbated by movement, with limited relief from current opioid regimen. Pain management is suboptimal, with current regimen including extended-release morphine  every 12 hours and oxycodone  twice daily. No significant improvement with current regimen. Education provided on safety use of medications and avoiding similar medications. No dose adjustment at this time to maximize use of current dosing before escalating.  - Continue extended-release morphine  every 12 hours. - Increase oxycodone  to every 4-6 hours as needed for breakthrough pain. - Will follow up with orthopedic and radiation oncology consultations.  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 Current management with Miralax  is ineffective. Reports bloating and tightness, which may exacerbate back pain. - Increase Miralax  to twice daily. - Consider using stool softeners like Colace as needed.  Palliative care management Focus on improving quality of life and managing symptoms. Coordination  with multiple specialists for comprehensive care. Emphasis on optimizing pain management and addressing opioid-induced constipation. - Coordinated with [facility] for medication refills. - Scheduled follow-up appointments with Radiation Oncologisst and orthopedic specialist. - Arranged for lab work and port flush at [facility] to reduce travel burden. - Planned phone follow-up on February 12th to assess pain management efficacy.  Patient expressed understanding and was in agreement with this plan. She also understands that She can call the clinic at any time with any questions, concerns, or complaints.   Thank you for your referral and allowing Palliative to assist in Mrs. Devere Medley Hutchins's care.   Number and complexity of problems addressed: HIGH - 1 or more chronic illnesses with SEVERE exacerbation, progression, or side effects of treatment - advanced cancer, pain. Any controlled substances utilized were prescribed in the context of palliative care.  I personally spent a total of 65 minutes in the care of the patient today including preparing to see the patient, getting/reviewing separately obtained history, performing a medically appropriate exam/evaluation, counseling and educating, documenting clinical information in the EHR, and coordinating care. Visit consisted of counseling and education dealing with the complex and emotionally intense issues of symptom management and palliative care in the setting of serious and potentially life-threatening illness.  Signed by: Levon Borer, AGPCNP-BC Palliative Medicine Team/[facility]    "

## 2024-03-26 NOTE — Telephone Encounter (Signed)
 I called the patient and discussed administering one unit of blood. We will arrange for 1 unit at [facility] tomorrow. I placed orders and released the type and screen and prepare. The patient is in agreement with the plan.

## 2024-03-27 ENCOUNTER — Inpatient Hospital Stay

## 2024-03-27 ENCOUNTER — Telehealth: Payer: Self-pay

## 2024-03-27 DIAGNOSIS — D6481 Anemia due to antineoplastic chemotherapy: Secondary | ICD-10-CM

## 2024-03-27 LAB — BPAM RBC
Blood Product Expiration Date: 202602282359
ISSUE DATE / TIME: 202602060932
Unit Type and Rh: 202602282359
Unit Type and Rh: 6200

## 2024-03-27 LAB — TYPE AND SCREEN
ABO/RH(D): A POS
Antibody Screen: NEGATIVE
Unit division: 0

## 2024-03-27 MED ORDER — ACETAMINOPHEN 325 MG PO TABS
650.0000 mg | ORAL_TABLET | Freq: Once | ORAL | Status: AC
  Administered 2024-03-27: 650 mg via ORAL
  Filled 2024-03-27: qty 2

## 2024-03-27 MED ORDER — DIPHENHYDRAMINE HCL 25 MG PO CAPS
25.0000 mg | ORAL_CAPSULE | Freq: Once | ORAL | Status: AC
  Administered 2024-03-27: 25 mg via ORAL
  Filled 2024-03-27: qty 1

## 2024-03-27 NOTE — Telephone Encounter (Signed)
 Ashna from [facility] called wanting verification on day, time, pick up for patients 1 unit RBC. Made aware patient is scheduled to come to [facility] today at 12 to receive RBC, would call [facility] on pick up, time to be delivered. Understood. [phone] Spoke with T.J. from [facility], stated needing 1 unit RBC to be picked up from [facility] at 10 am then deliver to [facility] by 11. Confirmed. Made infusion nurse aware.

## 2024-03-27 NOTE — Patient Instructions (Signed)
 Getting Blood Through an IV (Blood Transfusion) in Adults: What to Know After After a blood transfusion, it is common to have: Bruising and soreness at the IV site. A headache. Follow these instructions at home: Your doctor may give you more instructions. If you have problems, contact your doctor. Insertion site care     Follow instructions from your doctor about how to take care of your insertion site. This is where an IV tube was put into your vein. Make sure you: Wash your hands with soap and water  for at least 20 seconds before and after you change your bandage. If you cannot use soap and water , use hand sanitizer. Change your bandage as told by your doctor. Check your insertion site every day for signs of infection. Check for: Redness, swelling, or pain. Bleeding from the site. Warmth. Pus or a bad smell. General instructions Take over-the-counter and prescription medicines only as told by your doctor. Rest as told by your doctor. Go back to your normal activities as told by your doctor. Keep all follow-up visits. You may need to have tests at certain times to check your blood. Contact a doctor if: You have itching or red, swollen areas of skin (hives). You have a fever or chills. You have pain in the head, back, or chest. You feel worried or nervous (anxious). You feel weak after doing your normal activities. You have any of these problems at the insertion site: Redness, swelling, warmth, or pain. Bleeding that does not stop with pressure. Pus or a bad smell. If you received your blood transfusion in an outpatient setting, you will be told whom to contact to report any reactions. Get help right away if: You have signs of a serious reaction. This may be coming from an allergy or the body's defense system (immune system). Signs include: Trouble breathing or shortness of breath. Swelling of the face or feeling warm (flushed). A widespread rash. Dark pee (urine) or blood in  the pee. Fast heartbeat. These symptoms may be an emergency. Get help right away. Call 911. Do not wait to see if the symptoms will go away. Do not drive yourself to the hospital. Summary Bruising and soreness at the IV site are common. Check your insertion site every day for signs of infection. Rest as told by your doctor. Go back to your normal activities as told by your doctor. Get help right away if you have signs of a serious reaction. This information is not intended to replace advice given to you by your health care provider. Make sure you discuss any questions you have with your health care provider. Document Revised: 12/12/2023 Document Reviewed: 05/05/2021 [employer] Patient Education  2025 [employer].

## 2024-03-30 ENCOUNTER — Ambulatory Visit: Admitting: Radiology

## 2024-03-30 ENCOUNTER — Ambulatory Visit

## 2024-04-01 ENCOUNTER — Inpatient Hospital Stay

## 2024-04-02 ENCOUNTER — Inpatient Hospital Stay: Payer: Self-pay

## 2024-04-02 ENCOUNTER — Inpatient Hospital Stay: Payer: Self-pay | Admitting: Internal Medicine

## 2024-04-02 ENCOUNTER — Inpatient Hospital Stay

## 2024-04-04 ENCOUNTER — Other Ambulatory Visit

## 2024-04-07 ENCOUNTER — Ambulatory Visit: Admitting: Physician Assistant

## 2024-04-09 ENCOUNTER — Inpatient Hospital Stay

## 2024-04-09 ENCOUNTER — Inpatient Hospital Stay: Payer: Self-pay | Admitting: Physician Assistant

## 2024-04-09 ENCOUNTER — Inpatient Hospital Stay: Payer: Self-pay

## 2024-04-16 ENCOUNTER — Inpatient Hospital Stay

## 2024-04-23 ENCOUNTER — Inpatient Hospital Stay: Attending: Internal Medicine

## 2024-04-23 ENCOUNTER — Inpatient Hospital Stay: Payer: Self-pay

## 2024-04-23 ENCOUNTER — Inpatient Hospital Stay: Payer: Self-pay | Admitting: Internal Medicine

## 2024-04-30 ENCOUNTER — Inpatient Hospital Stay

## 2024-04-30 ENCOUNTER — Inpatient Hospital Stay: Admitting: Physician Assistant

## 2024-05-07 ENCOUNTER — Inpatient Hospital Stay

## 2024-05-14 ENCOUNTER — Inpatient Hospital Stay

## 2024-05-21 ENCOUNTER — Inpatient Hospital Stay

## 2024-05-21 ENCOUNTER — Inpatient Hospital Stay: Attending: Internal Medicine

## 2024-05-21 ENCOUNTER — Inpatient Hospital Stay: Admitting: Internal Medicine

## 2024-05-28 ENCOUNTER — Inpatient Hospital Stay: Attending: Internal Medicine

## 2024-06-04 ENCOUNTER — Inpatient Hospital Stay

## 2024-06-18 ENCOUNTER — Inpatient Hospital Stay

## 2024-07-22 ENCOUNTER — Ambulatory Visit: Admitting: Neurology
